# Patient Record
Sex: Female | Born: 1960 | Race: Black or African American | Hispanic: No | Marital: Single | Smoking: Never smoker
Health system: Southern US, Community
[De-identification: ages and names within clinical notes are randomized; demographics above are authoritative.]

## PROBLEM LIST (undated history)

## (undated) DIAGNOSIS — E119 Type 2 diabetes mellitus without complications: Secondary | ICD-10-CM

## (undated) DIAGNOSIS — D649 Anemia, unspecified: Secondary | ICD-10-CM

## (undated) DIAGNOSIS — K219 Gastro-esophageal reflux disease without esophagitis: Secondary | ICD-10-CM

## (undated) DIAGNOSIS — M549 Dorsalgia, unspecified: Secondary | ICD-10-CM

## (undated) DIAGNOSIS — H269 Unspecified cataract: Secondary | ICD-10-CM

## (undated) DIAGNOSIS — J45909 Unspecified asthma, uncomplicated: Secondary | ICD-10-CM

## (undated) DIAGNOSIS — Z9289 Personal history of other medical treatment: Secondary | ICD-10-CM

## (undated) DIAGNOSIS — I10 Essential (primary) hypertension: Secondary | ICD-10-CM

## (undated) DIAGNOSIS — Z6841 Body Mass Index (BMI) 40.0 and over, adult: Secondary | ICD-10-CM

## (undated) DIAGNOSIS — G2581 Restless legs syndrome: Secondary | ICD-10-CM

## (undated) HISTORY — PX: ABDOMINAL HYSTERECTOMY: SHX81

## (undated) HISTORY — DX: Anemia, unspecified: D64.9

---

## 2005-12-08 ENCOUNTER — Emergency Department: Payer: Self-pay | Admitting: Emergency Medicine

## 2006-05-27 ENCOUNTER — Encounter: Payer: Self-pay | Admitting: Anesthesiology

## 2006-06-12 ENCOUNTER — Emergency Department: Payer: Self-pay | Admitting: Emergency Medicine

## 2006-06-18 ENCOUNTER — Encounter: Payer: Self-pay | Admitting: Anesthesiology

## 2006-07-18 ENCOUNTER — Encounter: Payer: Self-pay | Admitting: Anesthesiology

## 2006-11-27 ENCOUNTER — Ambulatory Visit: Payer: Self-pay | Admitting: Anesthesiology

## 2006-12-14 ENCOUNTER — Encounter: Payer: Self-pay | Admitting: Anesthesiology

## 2009-10-08 ENCOUNTER — Emergency Department: Payer: Self-pay | Admitting: Emergency Medicine

## 2010-05-12 ENCOUNTER — Emergency Department: Payer: Self-pay | Admitting: Emergency Medicine

## 2011-07-08 ENCOUNTER — Emergency Department: Payer: Self-pay | Admitting: Unknown Physician Specialty

## 2012-02-08 ENCOUNTER — Emergency Department: Payer: Self-pay | Admitting: Emergency Medicine

## 2012-02-08 LAB — COMPREHENSIVE METABOLIC PANEL
Albumin: 3.8 g/dL (ref 3.4–5.0)
Anion Gap: 3 — ABNORMAL LOW (ref 7–16)
BUN: 15 mg/dL (ref 7–18)
Calcium, Total: 8.7 mg/dL (ref 8.5–10.1)
Chloride: 106 mmol/L (ref 98–107)
Creatinine: 0.98 mg/dL (ref 0.60–1.30)
EGFR (African American): >60
EGFR (Non-African Amer.): >60
Glucose: 129 mg/dL — ABNORMAL HIGH (ref 65–99)
Osmolality: 278 (ref 275–301)
Total Protein: 8.4 g/dL — ABNORMAL HIGH (ref 6.4–8.2)

## 2012-02-08 LAB — CBC
MCV: 86 fL (ref 80–100)
RBC: 4.76 10*6/uL (ref 3.80–5.20)
RDW: 15 % — ABNORMAL HIGH (ref 11.5–14.5)

## 2012-02-08 LAB — LIPASE, BLOOD: Lipase: 79 U/L (ref 73–393)

## 2012-02-08 LAB — TROPONIN I: Troponin-I: <0.02 ng/mL

## 2012-02-09 LAB — URINALYSIS, COMPLETE
Bilirubin,UR: NEGATIVE
Glucose,UR: NEGATIVE mg/dL (ref 0–75)
Ketone: NEGATIVE
Leukocyte Esterase: NEGATIVE
Nitrite: NEGATIVE
Protein: 30
Specific Gravity: 1.023 (ref 1.003–1.030)
WBC UR: 3 /HPF (ref 0–5)

## 2012-03-01 ENCOUNTER — Ambulatory Visit: Payer: Self-pay | Admitting: Adult Health

## 2013-04-20 ENCOUNTER — Ambulatory Visit: Payer: Self-pay | Admitting: Internal Medicine

## 2013-08-11 ENCOUNTER — Emergency Department: Payer: Self-pay | Admitting: Emergency Medicine

## 2013-08-11 LAB — URINALYSIS, COMPLETE
Bacteria: NONE SEEN
Bilirubin,UR: NEGATIVE
Glucose,UR: NEGATIVE mg/dL (ref 0–75)
Ketone: NEGATIVE
Leukocyte Esterase: NEGATIVE
Nitrite: NEGATIVE
Protein: 100
RBC,UR: 4454 /HPF (ref 0–5)
Specific Gravity: 1.021 (ref 1.003–1.030)
Squamous Epithelial: NONE SEEN
WBC UR: 13 /HPF (ref 0–5)

## 2013-08-11 LAB — CBC WITH DIFFERENTIAL/PLATELET
Basophil #: 0.1 10*3/uL (ref 0.0–0.1)
Eosinophil %: 3.1 %
HGB: 13.4 g/dL (ref 12.0–16.0)
Lymphocyte #: 1.2 10*3/uL (ref 1.0–3.6)
MCH: 28.1 pg (ref 26.0–34.0)
Monocyte #: 0.5 x10 3/mm (ref 0.2–0.9)
Monocyte %: 6.7 %
Platelet: 361 10*3/uL (ref 150–440)
RDW: 15.2 % — ABNORMAL HIGH (ref 11.5–14.5)
WBC: 7.7 10*3/uL (ref 3.6–11.0)

## 2013-08-11 LAB — COMPREHENSIVE METABOLIC PANEL
Alkaline Phosphatase: 89 U/L
BUN: 13 mg/dL (ref 7–18)
Calcium, Total: 9.1 mg/dL (ref 8.5–10.1)
Co2: 33 mmol/L — ABNORMAL HIGH (ref 21–32)
EGFR (African American): >60
Glucose: 144 mg/dL — ABNORMAL HIGH (ref 65–99)
Osmolality: 278 (ref 275–301)
SGOT(AST): 17 U/L (ref 15–37)
Total Protein: 8.1 g/dL (ref 6.4–8.2)

## 2013-08-11 LAB — LIPASE, BLOOD: Lipase: 51 U/L — ABNORMAL LOW (ref 73–393)

## 2013-08-18 HISTORY — PX: POLYPECTOMY: SHX149

## 2014-02-27 ENCOUNTER — Ambulatory Visit: Payer: Self-pay | Admitting: Family Medicine

## 2014-03-20 ENCOUNTER — Emergency Department: Payer: Self-pay | Admitting: Emergency Medicine

## 2014-03-20 LAB — CBC WITH DIFFERENTIAL/PLATELET
BASOS ABS: 0 10*3/uL (ref 0.0–0.1)
Basophil %: 0.4 %
EOS ABS: 0.1 10*3/uL (ref 0.0–0.7)
EOS PCT: 1.7 %
HCT: 42.7 % (ref 35.0–47.0)
HGB: 13.7 g/dL (ref 12.0–16.0)
Lymphocyte #: 0.6 10*3/uL — ABNORMAL LOW (ref 1.0–3.6)
Lymphocyte %: 8.1 %
MCH: 26.9 pg (ref 26.0–34.0)
MCHC: 32.2 g/dL (ref 32.0–36.0)
MCV: 84 fL (ref 80–100)
MONOS PCT: 3 %
Monocyte #: 0.2 x10 3/mm (ref 0.2–0.9)
NEUTROS ABS: 6.3 10*3/uL (ref 1.4–6.5)
NEUTROS PCT: 86.8 %
Platelet: 420 10*3/uL (ref 150–440)
RBC: 5.1 10*6/uL (ref 3.80–5.20)
RDW: 15.6 % — ABNORMAL HIGH (ref 11.5–14.5)
WBC: 7.3 10*3/uL (ref 3.6–11.0)

## 2014-03-20 LAB — COMPREHENSIVE METABOLIC PANEL
ALBUMIN: 3.5 g/dL (ref 3.4–5.0)
AST: 13 U/L — AB (ref 15–37)
Alkaline Phosphatase: 89 U/L
Anion Gap: 6 — ABNORMAL LOW (ref 7–16)
BUN: 14 mg/dL (ref 7–18)
Bilirubin,Total: 0.4 mg/dL (ref 0.2–1.0)
CHLORIDE: 103 mmol/L (ref 98–107)
CO2: 31 mmol/L (ref 21–32)
CREATININE: 1.15 mg/dL (ref 0.60–1.30)
Calcium, Total: 8.8 mg/dL (ref 8.5–10.1)
EGFR (African American): >60
EGFR (Non-African Amer.): 54 — ABNORMAL LOW
GLUCOSE: 125 mg/dL — AB (ref 65–99)
Osmolality: 281 (ref 275–301)
Potassium: 3.7 mmol/L (ref 3.5–5.1)
SGPT (ALT): 23 U/L
Sodium: 140 mmol/L (ref 136–145)
Total Protein: 8.6 g/dL — ABNORMAL HIGH (ref 6.4–8.2)

## 2014-03-20 LAB — TROPONIN I: Troponin-I: <0.02 ng/mL

## 2014-03-20 LAB — LIPASE, BLOOD: Lipase: 80 U/L (ref 73–393)

## 2014-04-10 ENCOUNTER — Ambulatory Visit: Payer: Self-pay | Admitting: Obstetrics and Gynecology

## 2014-04-10 LAB — CBC
HCT: 38.2 % (ref 35.0–47.0)
HGB: 12 g/dL (ref 12.0–16.0)
MCH: 26.3 pg (ref 26.0–34.0)
MCHC: 31.5 g/dL — ABNORMAL LOW (ref 32.0–36.0)
MCV: 83 fL (ref 80–100)
Platelet: 421 10*3/uL (ref 150–440)
RBC: 4.58 10*6/uL (ref 3.80–5.20)
RDW: 16.3 % — ABNORMAL HIGH (ref 11.5–14.5)
WBC: 6.5 10*3/uL (ref 3.6–11.0)

## 2014-04-10 LAB — COMPREHENSIVE METABOLIC PANEL
Albumin: 3.1 g/dL — ABNORMAL LOW (ref 3.4–5.0)
Alkaline Phosphatase: 82 U/L
Anion Gap: 4 — ABNORMAL LOW (ref 7–16)
BILIRUBIN TOTAL: 0.3 mg/dL (ref 0.2–1.0)
BUN: 14 mg/dL (ref 7–18)
Calcium, Total: 8.8 mg/dL (ref 8.5–10.1)
Chloride: 108 mmol/L — ABNORMAL HIGH (ref 98–107)
Co2: 31 mmol/L (ref 21–32)
Creatinine: 1.14 mg/dL (ref 0.60–1.30)
EGFR (Non-African Amer.): 54 — ABNORMAL LOW
Glucose: 107 mg/dL — ABNORMAL HIGH (ref 65–99)
Osmolality: 286 (ref 275–301)
Potassium: 3.4 mmol/L — ABNORMAL LOW (ref 3.5–5.1)
SGOT(AST): 18 U/L (ref 15–37)
SGPT (ALT): 14 U/L
Sodium: 143 mmol/L (ref 136–145)
TOTAL PROTEIN: 7.5 g/dL (ref 6.4–8.2)

## 2014-04-20 ENCOUNTER — Ambulatory Visit: Payer: Self-pay | Admitting: Obstetrics and Gynecology

## 2014-04-20 LAB — HCG, QUANTITATIVE, PREGNANCY

## 2014-04-28 LAB — PATHOLOGY REPORT

## 2014-08-04 ENCOUNTER — Emergency Department: Payer: Self-pay | Admitting: Emergency Medicine

## 2014-09-18 ENCOUNTER — Emergency Department: Payer: Self-pay | Admitting: Emergency Medicine

## 2014-09-29 ENCOUNTER — Emergency Department: Payer: Self-pay | Admitting: Emergency Medicine

## 2014-12-09 NOTE — Op Note (Signed)
PATIENT NAME:  Kristina Cruz, Kristina Cruz MR#:  [id number] DATE OF BIRTH:  Dec 30, 1960  DATE OF PROCEDURE:  04/20/2014  PREOPERATIVE DIAGNOSIS: Postmenopausal bleeding.   POSTOPERATIVE DIAGNOSES: 1.  Postmenopausal bleeding.  2.  Likely polyps.  PROCEDURES: 1.  Dilation and curettage.  2.  Hysteroscopy.  3.  Polypectomy.   ANESTHESIA: General.   SURGEON: Thomasene MohairStephen Monterio Bob, M.D.   ESTIMATED BLOOD LOSS: 25 mL.  OPERATIVE FLUIDS: 600 mL crystalloid.   FLUID DEFICIT: 80 mL.  COMPLICATIONS: None.   FINDINGS: Polypoid lesions on the anterior wall of the uterus.   SPECIMENS:  1.  Endometrial curettings.  2.  Polyps.   CONDITION AT END OF PROCEDURE: Stable.   PROCEDURE IN DETAIL: The patient was taken to the operating room where general anesthesia was administered and found to be adequate. The patient was placed in the dorsal supine high lithotomy position in candy-cane stirrups with care to minimize risk of damage to nerves or blood vessels. She was prepped and draped in the usual sterile fashion. A timeout was called and her bladder was drained using in and out catheterization. A sterile speculum was placed in the vagina and a single-tooth tenaculum was affixed to the anterior lip of the cervix. The uterus was dilated gently in a serial fashion using Hegar dilators to 6 mm. The MyoSure hysteroscope was gently advanced through the cervix into the uterine cavity with the above-noted findings. The MyoSure light device was then used to remove the polypoid lesions. The scope was removed and gentle curettage was performed for global sample of the uterus. The scope was reintroduced to verify that hemostasis was present, which was verified. At this point, the procedure was terminated. The hysteroscope was removed, as well as the single-tooth tenaculum, and hemostasis verified. All instrumentation was verified to be out of the vagina.   The patient tolerated the procedure well. Sponge, lap, and needle  counts were correct x2. For VTE prophylaxis, the patient was wearing pneumatic compression stockings throughout the entire procedure. She was awakened in the operating room taken to the recovery area in stable condition.   ____________________________ Conard NovakStephen D. Sanyla Summey, MD sdj:sb D: 04/20/2014 10:26:04 ET T: 04/20/2014 10:35:16 ET JOB#: [id number]  cc: Conard NovakStephen D. Charissa Knowles, MD, <Dictator> Conard NovakSTEPHEN D Korena Nass MD ELECTRONICALLY SIGNED 05/05/2014 10:54

## 2014-12-21 ENCOUNTER — Emergency Department: Payer: Medicaid Other

## 2014-12-21 ENCOUNTER — Other Ambulatory Visit: Payer: Self-pay

## 2014-12-21 ENCOUNTER — Emergency Department
Admission: EM | Admit: 2014-12-21 | Discharge: 2014-12-21 | Disposition: A | Discharge status: Home / Self Care | Payer: Medicaid Other | Attending: Internal Medicine | Admitting: Internal Medicine

## 2014-12-21 ENCOUNTER — Encounter: Payer: Self-pay | Admitting: Emergency Medicine

## 2014-12-21 DIAGNOSIS — I10 Essential (primary) hypertension: Secondary | ICD-10-CM | POA: Diagnosis not present

## 2014-12-21 DIAGNOSIS — R1013 Epigastric pain: Secondary | ICD-10-CM | POA: Insufficient documentation

## 2014-12-21 DIAGNOSIS — M25552 Pain in left hip: Secondary | ICD-10-CM | POA: Diagnosis not present

## 2014-12-21 DIAGNOSIS — Z79899 Other long term (current) drug therapy: Secondary | ICD-10-CM | POA: Insufficient documentation

## 2014-12-21 DIAGNOSIS — E876 Hypokalemia: Secondary | ICD-10-CM | POA: Diagnosis not present

## 2014-12-21 DIAGNOSIS — G8929 Other chronic pain: Secondary | ICD-10-CM | POA: Diagnosis not present

## 2014-12-21 DIAGNOSIS — R42 Dizziness and giddiness: Secondary | ICD-10-CM | POA: Diagnosis present

## 2014-12-21 HISTORY — DX: Essential (primary) hypertension: I10

## 2014-12-21 LAB — COMPREHENSIVE METABOLIC PANEL WITH GFR
ALT: 13 U/L — ABNORMAL LOW (ref 14–54)
AST: 16 U/L (ref 15–41)
Albumin: 3.5 g/dL (ref 3.5–5.0)
Alkaline Phosphatase: 67 U/L (ref 38–126)
Anion gap: 7 (ref 5–15)
BUN: 13 mg/dL (ref 6–20)
CO2: 32 mmol/L (ref 22–32)
Calcium: 8.8 mg/dL — ABNORMAL LOW (ref 8.9–10.3)
Chloride: 103 mmol/L (ref 101–111)
Creatinine, Ser: 1.08 mg/dL — ABNORMAL HIGH (ref 0.44–1.00)
GFR calc Af Amer: >60 mL/min (ref >60)
GFR calc non Af Amer: 56 mL/min — ABNORMAL LOW (ref >60)
Glucose, Bld: 106 mg/dL — ABNORMAL HIGH (ref 65–99)
Potassium: 3.4 mmol/L — ABNORMAL LOW (ref 3.5–5.1)
Sodium: 142 mmol/L (ref 135–145)
Total Bilirubin: 0.7 mg/dL (ref 0.3–1.2)
Total Protein: 7.8 g/dL (ref 6.5–8.1)

## 2014-12-21 LAB — CBC WITH DIFFERENTIAL/PLATELET
Basophils Absolute: 0.1 K/uL (ref 0–0.1)
Basophils Relative: 1 %
Eosinophils Absolute: 0.3 K/uL (ref 0–0.7)
Eosinophils Relative: 4 %
HCT: 42.5 % (ref 35.0–47.0)
Hemoglobin: 13.7 g/dL (ref 12.0–16.0)
Lymphocytes Relative: 32 %
Lymphs Abs: 2.2 K/uL (ref 1.0–3.6)
MCH: 27.6 pg (ref 26.0–34.0)
MCHC: 32.2 g/dL (ref 32.0–36.0)
MCV: 85.7 fL (ref 80.0–100.0)
Monocytes Absolute: 0.6 K/uL (ref 0.2–0.9)
Monocytes Relative: 9 %
Neutro Abs: 3.6 K/uL (ref 1.4–6.5)
Neutrophils Relative %: 54 %
Platelets: 374 K/uL (ref 150–440)
RBC: 4.96 MIL/uL (ref 3.80–5.20)
RDW: 15 % — ABNORMAL HIGH (ref 11.5–14.5)
WBC: 6.7 K/uL (ref 3.6–11.0)

## 2014-12-21 LAB — LIPASE, BLOOD: LIPASE: 27 U/L (ref 22–51)

## 2014-12-21 LAB — TROPONIN I: Troponin I: <0.03 ng/mL (ref <0.031)

## 2014-12-21 MED ORDER — SODIUM CHLORIDE 0.9 % IV SOLN
INTRAVENOUS | Status: DC
  Administered 2014-12-21: 09:00:00 via INTRAVENOUS

## 2014-12-21 MED ORDER — ONDANSETRON HCL 4 MG/2ML IJ SOLN
4.0000 mg | Freq: Once | INTRAMUSCULAR | Status: AC
  Administered 2014-12-21: 4 mg via INTRAVENOUS

## 2014-12-21 MED ORDER — PANTOPRAZOLE SODIUM 40 MG IV SOLR
40.0000 mg | Freq: Once | INTRAVENOUS | Status: AC
  Administered 2014-12-21: 40 mg via INTRAVENOUS

## 2014-12-21 MED ORDER — PANTOPRAZOLE SODIUM 40 MG IV SOLR
INTRAVENOUS | Status: AC
  Administered 2014-12-21: 40 mg via INTRAVENOUS
  Filled 2014-12-21: qty 40

## 2014-12-21 MED ORDER — PANTOPRAZOLE SODIUM 40 MG PO TBEC: 40.0000 mg | DELAYED_RELEASE_TABLET | Freq: Every day | ORAL | Status: DC

## 2014-12-21 MED ORDER — ONDANSETRON HCL 4 MG/2ML IJ SOLN
INTRAMUSCULAR | Status: AC
  Administered 2014-12-21: 4 mg via INTRAVENOUS
  Filled 2014-12-21: qty 2

## 2014-12-21 MED ORDER — POTASSIUM CHLORIDE ER 10 MEQ PO TBCR: 10.0000 meq | EXTENDED_RELEASE_TABLET | Freq: Every day | ORAL | Status: DC

## 2014-12-21 NOTE — ED Notes (Addendum)
Patient assisted into stretcher. Cardiac monitor placed on patient. No obvious distress at this time. Call bell within reach. Patient instructed to call with needs/concerns. Notified MD of patient's arrival to room.

## 2014-12-21 NOTE — ED Notes (Signed)
Patient states that she attempted to void but was unable.

## 2014-12-21 NOTE — ED Provider Notes (Signed)
[facility] Emergency Department Provider Note    ____________________________________________  Time seen: 8:02 AM I have reviewed the triage vital signs and the nursing notes.   HISTORY  Chief Complaint Abdominal Pain and Dizziness        HPI Kristina Cruz is a 54 y.o. female presents to the emergency department with a chief: Complaint of abdominal pain.  She's had the abdominal pain for one week she describes it as epigastric in location of the duration has been one week it's intermittent in nature and seems to be worse when she is trying to eat or eating after eating. The severity she describes as 5-6/10 moderate in severity or quality is aching and soreness. Modifying factors or food seems to make it worse. Associated symptoms are nausea. She has had no episodes of vomiting. She has had no diarrhea.  Yesterday only ate "a few bites" because the pain was making her belly feel worse. Her daughter did force her to eat a hot dog this morning with some water. She did spit up a little bit after drinking the water but doesn't think she was vomiting.  She is also complaining of feeling lightheaded and weak and dizzy. This is been for the past few days. She has not been eating or drinking as much as she normally does over the past few days.  She has not taken her medication for high blood pressure today which is hydrochlorothiazide. She has been taking a lot of BC powders for her hip pain. She does have an appointment to see an orthopedic doctor in the next several weeks to determine whether she needs surgery or further evaluation of her chronic left hip pain. She has a prescription for tramadol for this pain.     Past Medical History  Diagnosis Date  . Hypertension   Restless leg syndrome Left hip problems Peptic ulcer disease    There are no active problems to display for this patient.   History reviewed. No pertinent past surgical  history.  Current Outpatient Rx  Name  Route  Sig  Dispense  Refill  . Aspirin-Salicylamide-Caffeine (BC HEADACHE POWDER PO)   Oral   Take 1 packet by mouth daily as needed.         Marland Kitchen PRESCRIPTION MEDICATION   Oral   Take 1 tablet by mouth daily.         Marland Kitchen rOPINIRole (REQUIP) 0.25 MG tablet   Oral   Take 0.25 mg by mouth 3 (three) times daily.          tramadol 50 mg when necessary HCTZ 25 mg daily  BC powder for her hip pain   Allergies   Other  No family history on file.  Social History History  Substance Use Topics  . Smoking status: Never Smoker   . Smokeless tobacco: Not on file  . Alcohol Use: No    Review of Systems  Constitutional: Negative for fever. Eyes: Negative for visual changes. ENT: Negative for sore throat. Cardiovascular: Negative for chest pain. Respiratory: Negative for shortness of breath. Gastrointestinal: Positive for abdominal pain, no vomiting and diarrhea. Genitourinary: Negative for dysuria. Musculoskeletal: Negative for back pain. Skin: Negative for rash. Neurological: Negative for headaches, focal weakness or numbness. Positive for dizziness 10-point ROS otherwise negative.  ____________________________________________   PHYSICAL EXAM:  VITAL SIGNS: ED Triage Vitals  Enc Vitals Group     BP --      Pulse --      Resp --  Temp --      Temp src --      SpO2 --      Weight --      Height --      Head Cir --      Peak Flow --      Pain Score 12/21/14 0743 7     Pain Loc --      Pain Edu? --      Excl. in GC? --   Initial vital signs in the emergency department shows a blood pressure 138/104 which is elevated. Heart rate of 80 and a respiratory rate of 18 which are normal a sat of 100% on room air which is normal and the patient is afebrile.   Constitutional: Alert and oriented. Well appearing and in no distress. Eyes: Conjunctivae are normal. PERRL. Normal extraocular movements. ENT   Head:  Normocephalic and atraumatic.   Nose: No congestion/rhinnorhea.   Mouth/Throat: Mucous membranes are moist.   Neck: No stridor. Hematological/Lymphatic/Immunilogical: No cervical lymphadenopathy. Cardiovascular: Normal rate, regular rhythm. Normal and symmetric distal pulses are present in all extremities. No murmurs, rubs, or gallops. Respiratory: Normal respiratory effort without tachypnea nor retractions. Breath sounds are clear and equal bilaterally. No wheezes/rales/rhonchi. Gastrointestinal: Soft and nontender. No distention. No abdominal bruits. There is no CVA tenderness. Genitourinary: Deferred Musculoskeletal: Nontender with normal range of motion in all extremities. No joint effusions.  No lower extremity tenderness nor edema. Neurologic:  Normal speech and language. No gross focal neurologic deficits are appreciated. Speech is normal. No gait instability. Skin:  Skin is warm, dry and intact. No rash noted. Psychiatric: Mood and affect are normal. Speech and behavior are normal. Patient exhibits appropriate insight and judgment.  ____________________________________________    LABS (pertinent positives/negatives)  Labs Reviewed  CBC WITH DIFFERENTIAL/PLATELET - Abnormal; Notable for the following:    RDW 15.0 (*)    All other components within normal limits  COMPREHENSIVE METABOLIC PANEL - Abnormal; Notable for the following:    Potassium 3.4 (*)    Glucose, Bld 106 (*)    Creatinine, Ser 1.08 (*)    Calcium 8.8 (*)    ALT 13 (*)    GFR calc non Af Amer 56 (*)    All other components within normal limits  TROPONIN I  LIPASE, BLOOD  URINALYSIS COMPLETEWITH MICROSCOPIC [facility])    labs are significant for slightly low potassium at 3.4. And a Slightly elevated creatinine 1.08.   Significant normal labs included troponin which is normal at less than 0.03, and a lipase which is normal at 27____________________________________   EKG  ED ECG REPORT   Date:  12/21/2014  EKG Time: 8:37 AM  Rate: 67  Rhythm: normal EKG, normal sinus rhythm, unchanged from previous tracings, normal sinus rhythm  Axis: Normal  Intervals:left anterior fascicular block  ST&T Change: T wave abnormality consider anterolateral ischemia. Minimal voltage for LVH. Nonspecific ST-T wave changes in the anterior septal and lateral leads.   ____________________________________________    RADIOLOGY   x-ray three-way abdomen was done in the emergency room and shows normal bowel gas pattern no free air noted acute cardiopulmonary abnormalities.  ____________________________________________   PROCEDURES  Procedure(s) performed: None  Critical Care performed: No  ____________________________________________   INITIAL IMPRESSION / ASSESSMENT AND PLAN  /D COURSE  Pertinent labs & imaging results that were available during my care of the patient were reviewed by me and considered in my medical decision making (see chart for details).   Impression  54 year old female presents to the ED with one-week history of abdominal pain, nausea, and decreased appetite. In the emergency department she received IV Zofran and IV Protonix.  She had no nausea or vomiting in the emergency department and was able to tolerate fluids. Her laboratory evaluation and radiologic studies and EKG do not indicate an indication for hospitalization. However she will need to be evaluated further by her primary care doctor with a possible consultation to gastroenterology for upper endoscopy  . I suspect the diagnosis of recurrence of peptic ulcer disease or gastritis. I will start her on oral Protonix. She will be given prescription for Zofran by mouth for her nausea be encouraged to eat a lot lights and non-spicy diet with no alcohol.  For the hypokalemia she is given one week of by mouth KCl.        FINAL CLINICAL IMPRESSION(S) / ED DIAGNOSES  Final diagnoses:  None   #1 abdominal pain #2  nausea #3 hypokalemia #4 hypertension    Sherlyn HaySheryl L Valina Maes, DO 12/21/14 1128

## 2014-12-21 NOTE — Discharge Instructions (Signed)
Take your bp medication as directed. Take the potassium medication as directed. Take Protonix as directed. Eat a low fat nonfried

## 2014-12-21 NOTE — ED Notes (Addendum)
Discussed need for urine sample with patient. Patient denies need to void at this time. Encouraged patient to notify nurse when able to provide sample. Patient verbalized understanding.   

## 2014-12-21 NOTE — ED Notes (Signed)
Report form Glenford PeersJean J, RN

## 2014-12-21 NOTE — ED Notes (Signed)
Epigastric abd pain x1 week, worsening , with lightheaded and dizziness, ambulatory with a cane

## 2015-02-16 ENCOUNTER — Emergency Department
Admission: EM | Admit: 2015-02-16 | Discharge: 2015-02-16 | Disposition: A | Discharge status: Home / Self Care | Payer: Medicaid Other | Attending: Emergency Medicine | Admitting: Emergency Medicine

## 2015-02-16 ENCOUNTER — Emergency Department: Payer: Medicaid Other

## 2015-02-16 ENCOUNTER — Other Ambulatory Visit: Payer: Self-pay

## 2015-02-16 DIAGNOSIS — R079 Chest pain, unspecified: Secondary | ICD-10-CM | POA: Insufficient documentation

## 2015-02-16 DIAGNOSIS — J40 Bronchitis, not specified as acute or chronic: Secondary | ICD-10-CM

## 2015-02-16 DIAGNOSIS — I10 Essential (primary) hypertension: Secondary | ICD-10-CM | POA: Diagnosis not present

## 2015-02-16 DIAGNOSIS — Z79899 Other long term (current) drug therapy: Secondary | ICD-10-CM | POA: Diagnosis not present

## 2015-02-16 DIAGNOSIS — R0602 Shortness of breath: Secondary | ICD-10-CM | POA: Diagnosis present

## 2015-02-16 LAB — URINALYSIS COMPLETE WITH MICROSCOPIC (ARMC ONLY)
Bacteria, UA: NONE SEEN
Bilirubin Urine: NEGATIVE
Glucose, UA: NEGATIVE mg/dL
Ketones, ur: NEGATIVE mg/dL
LEUKOCYTES UA: NEGATIVE
Nitrite: NEGATIVE
Protein, ur: NEGATIVE mg/dL
Specific Gravity, Urine: 1.016 (ref 1.005–1.030)
pH: 5 (ref 5.0–8.0)

## 2015-02-16 LAB — COMPREHENSIVE METABOLIC PANEL
ALBUMIN: 3.6 g/dL (ref 3.5–5.0)
ALK PHOS: 87 U/L (ref 38–126)
ALT: 12 U/L — AB (ref 14–54)
ANION GAP: 9 (ref 5–15)
AST: 13 U/L — ABNORMAL LOW (ref 15–41)
BILIRUBIN TOTAL: 0.3 mg/dL (ref 0.3–1.2)
BUN: 12 mg/dL (ref 6–20)
CHLORIDE: 105 mmol/L (ref 101–111)
CO2: 26 mmol/L (ref 22–32)
Calcium: 8.7 mg/dL — ABNORMAL LOW (ref 8.9–10.3)
Creatinine, Ser: 1.04 mg/dL — ABNORMAL HIGH (ref 0.44–1.00)
GFR calc Af Amer: >60 mL/min (ref >60)
GFR calc non Af Amer: 59 mL/min — ABNORMAL LOW (ref >60)
GLUCOSE: 105 mg/dL — AB (ref 65–99)
Potassium: 3.5 mmol/L (ref 3.5–5.1)
SODIUM: 140 mmol/L (ref 135–145)
TOTAL PROTEIN: 7.6 g/dL (ref 6.5–8.1)

## 2015-02-16 LAB — CBC
HCT: 38.6 % (ref 35.0–47.0)
HEMOGLOBIN: 12.7 g/dL (ref 12.0–16.0)
MCH: 28 pg (ref 26.0–34.0)
MCHC: 32.9 g/dL (ref 32.0–36.0)
MCV: 85.1 fL (ref 80.0–100.0)
Platelets: 359 10*3/uL (ref 150–440)
RBC: 4.54 MIL/uL (ref 3.80–5.20)
RDW: 15.5 % — ABNORMAL HIGH (ref 11.5–14.5)
WBC: 7.6 10*3/uL (ref 3.6–11.0)

## 2015-02-16 LAB — TROPONIN I

## 2015-02-16 MED ORDER — MORPHINE SULFATE 2 MG/ML IJ SOLN
2.0000 mg | Freq: Once | INTRAMUSCULAR | Status: AC
  Administered 2015-02-16: 2 mg via INTRAVENOUS

## 2015-02-16 MED ORDER — IPRATROPIUM-ALBUTEROL 0.5-2.5 (3) MG/3ML IN SOLN
3.0000 mL | Freq: Once | RESPIRATORY_TRACT | Status: AC
  Administered 2015-02-16: 3 mL via RESPIRATORY_TRACT

## 2015-02-16 MED ORDER — IPRATROPIUM-ALBUTEROL 0.5-2.5 (3) MG/3ML IN SOLN
RESPIRATORY_TRACT | Status: AC
  Administered 2015-02-16: 3 mL via RESPIRATORY_TRACT
  Filled 2015-02-16: qty 3

## 2015-02-16 MED ORDER — DIPHENHYDRAMINE HCL 50 MG/ML IJ SOLN
INTRAMUSCULAR | Status: AC
  Administered 2015-02-16: 50 mg via INTRAVENOUS
  Filled 2015-02-16: qty 1

## 2015-02-16 MED ORDER — MORPHINE SULFATE 2 MG/ML IJ SOLN
INTRAMUSCULAR | Status: AC
  Administered 2015-02-16: 2 mg via INTRAVENOUS
  Filled 2015-02-16: qty 1

## 2015-02-16 MED ORDER — IOHEXOL 350 MG/ML SOLN
100.0000 mL | Freq: Once | INTRAVENOUS | Status: AC | PRN
  Administered 2015-02-16: 100 mL via INTRAVENOUS

## 2015-02-16 MED ORDER — ONDANSETRON HCL 4 MG/2ML IJ SOLN
INTRAMUSCULAR | Status: AC
  Administered 2015-02-16: 4 mg via INTRAVENOUS
  Filled 2015-02-16: qty 2

## 2015-02-16 MED ORDER — PREDNISONE 20 MG PO TABS: 60.0000 mg | ORAL_TABLET | Freq: Every day | ORAL | Status: DC

## 2015-02-16 MED ORDER — ONDANSETRON HCL 4 MG/2ML IJ SOLN
4.0000 mg | Freq: Once | INTRAMUSCULAR | Status: AC
  Administered 2015-02-16: 4 mg via INTRAVENOUS

## 2015-02-16 MED ORDER — METHYLPREDNISOLONE SODIUM SUCC 125 MG IJ SOLR
INTRAMUSCULAR | Status: AC
  Administered 2015-02-16: 125 mg via INTRAVENOUS
  Filled 2015-02-16: qty 2

## 2015-02-16 MED ORDER — ASPIRIN 81 MG PO CHEW
CHEWABLE_TABLET | ORAL | Status: AC
  Administered 2015-02-16: 324 mg via ORAL
  Filled 2015-02-16: qty 4

## 2015-02-16 MED ORDER — ASPIRIN EC 325 MG PO TBEC: 325.0000 mg | DELAYED_RELEASE_TABLET | Freq: Once | ORAL | Status: DC

## 2015-02-16 MED ORDER — ASPIRIN 81 MG PO CHEW
324.0000 mg | CHEWABLE_TABLET | Freq: Once | ORAL | Status: AC
  Administered 2015-02-16: 324 mg via ORAL

## 2015-02-16 MED ORDER — METHYLPREDNISOLONE SODIUM SUCC 125 MG IJ SOLR
125.0000 mg | Freq: Once | INTRAMUSCULAR | Status: AC
  Administered 2015-02-16: 125 mg via INTRAVENOUS

## 2015-02-16 MED ORDER — DIPHENHYDRAMINE HCL 50 MG/ML IJ SOLN
50.0000 mg | Freq: Once | INTRAMUSCULAR | Status: AC
  Administered 2015-02-16: 50 mg via INTRAVENOUS

## 2015-02-16 MED ORDER — PREDNISONE 20 MG PO TABS: 60.0000 mg | ORAL_TABLET | Freq: Once | ORAL | Status: DC

## 2015-02-16 NOTE — Discharge Instructions (Signed)

## 2015-02-16 NOTE — ED Provider Notes (Signed)
[facility] Emergency Department Provider Note  ____________________________________________  Time seen: 6:00 AM  I have reviewed the triage vital signs and the nursing notes.   HISTORY  Chief Complaint Shortness of Breath      HPI Kristina Cruz is a 54 y.o. female presents with cough and dyspnea and right-sided chest pain times 2 days.     Past Medical History  Diagnosis Date  . Hypertension     There are no active problems to display for this patient.   No past surgical history on file.  Current Outpatient Rx  Name  Route  Sig  Dispense  Refill  . Aspirin-Salicylamide-Caffeine (BC HEADACHE POWDER PO)   Oral   Take 1 packet by mouth daily as needed.         . pantoprazole (PROTONIX) 40 MG tablet   Oral   Take 1 tablet (40 mg total) by mouth daily.   30 tablet   1   . potassium chloride (K-DUR) 10 MEQ tablet   Oral   Take 1 tablet (10 mEq total) by mouth daily.   30 tablet   0   . PRESCRIPTION MEDICATION   Oral   Take 1 tablet by mouth daily.         Marland Kitchen rOPINIRole (REQUIP) 0.25 MG tablet   Oral   Take 0.25 mg by mouth 3 (three) times daily.           Allergies Other  No family history on file.  Social History History  Substance Use Topics  . Smoking status: Never Smoker   . Smokeless tobacco: Not on file  . Alcohol Use: No    Review of Systems  Constitutional: Negative for fever. Eyes: Negative for visual changes. ENT: Negative for sore throat. Cardiovascular: Positive for chest pain. Respiratory: Positive for shortness of breath. Gastrointestinal: Negative for abdominal pain, vomiting and diarrhea. Genitourinary: Negative for dysuria. Musculoskeletal: Negative for back pain. Skin: Negative for rash. Neurological: Negative for headaches, focal weakness or numbness.   10-point ROS otherwise negative.  ____________________________________________   PHYSICAL EXAM:  VITAL SIGNS: ED Triage  Vitals  Enc Vitals Group     BP 02/16/15 0604 132/58 mmHg     Pulse Rate 02/16/15 0603 73     Resp 02/16/15 0603 18     Temp 02/16/15 0603 98.3 F (36.8 C)     Temp Source 02/16/15 0603 Oral     SpO2 02/16/15 0603 96 %     Weight 02/16/15 0603 333 lb (151.048 kg)     Height 02/16/15 0603  (1.575 m)     Head Cir --      Peak Flow --      Pain Score 02/16/15 0603 6     Pain Loc --      Pain Edu? --      Excl. in GC? --      Constitutional: Alert and oriented. Well appearing and in no distress. Eyes: Conjunctivae are normal. PERRL. Normal extraocular movements. ENT   Head: Normocephalic and atraumatic.   Nose: No congestion/rhinnorhea.   Mouth/Throat: Mucous membranes are moist.   Neck: No stridor. Cardiovascular: Normal rate, regular rhythm. Normal and symmetric distal pulses are present in all extremities. No murmurs, rubs, or gallops. Respiratory: Normal respiratory effort without tachypnea nor retractions. Breath sounds are clear and equal bilaterally. No wheezes/rales/rhonchi. Gastrointestinal: Soft and nontender. No distention. There is no CVA tenderness. Genitourinary: deferred Musculoskeletal: Nontender with normal range of motion in all  extremities. No joint effusions.  No lower extremity tenderness nor edema. Neurologic:  Normal speech and language. No gross focal neurologic deficits are appreciated. Speech is normal.  Skin:  Skin is warm, dry and intact. No rash noted. Psychiatric: Mood and affect are normal. Speech and behavior are normal. Patient exhibits appropriate insight and judgment.  ____________________________________________    LABS (pertinent positives/negatives)    ____________________________________________   EKG Interpreted by me Dr. Bayard Malesandolph Janal Haak   Date: 02/16/2015  Rate: 67  Rhythm: normal sinus rhythm  QRS Axis: normal  Intervals: normal  ST/T Wave abnormalities: normal  Conduction Disutrbances: none  Narrative  Interpretation: unremarkable      ____________________________________________    RADIOLOGY  CT scan of the chest revealed:  IMPRESSION: No demonstrable pulmonary embolus. No edema or consolidation. There is hepatic steatosis.    INITIAL IMPRESSION / ASSESSMENT AND PLAN / ED COURSE  Pertinent labs & imaging results that were available during my care of the patient were reviewed by me and considered in my medical decision making (see chart for details).    ____________________________________________   FINAL CLINICAL IMPRESSION(S) / ED DIAGNOSES  Final diagnoses:  Bronchitis  Chest pain, unspecified chest pain type      Darci Currentandolph N Stephanine Reas, MD 02/20/15 [phone]

## 2015-02-16 NOTE — ED Notes (Signed)
Pt in with co cough and wheezing since yest.

## 2015-02-16 NOTE — ED Notes (Signed)
Pt returned from CT °

## 2015-02-16 NOTE — ED Notes (Signed)
Pt. States wheezing, coughing with sinus drainage for the past 3 days.  Pt. States more difficult this morning.  Pt. States PCP gave her rescue inhaler couple months ago, but it gave her no relief today.  Pt. Also states chest pain that starts midsternum and radiates to the rt. Side of chest.

## 2015-02-16 NOTE — ED Provider Notes (Addendum)
  Physical Exam  BP 145/71 mmHg  Pulse 67  Temp(Src) 98.3 F (36.8 C) (Oral)  Resp 18  Ht 5\' 2"  (1.575 m)  Wt 333 lb (151.048 kg)  BMI 60.89 kg/m2  SpO2 95%  LMP 02/11/2015 ----------------------------------------- 10:21 AM on 02/16/2015 -----------------------------------------   Physical Exam Patient resting comfortably. No dyspnea. Speaks in full sentences. Not tachycardic, hypotensive or hypoxic. No wheezing on exam but does have expiratory cough. Chest pain anteriorly is reproducible. No ankle swelling at this time. Patient says that her left ankle swollen 3 days ago. Does not smoke or have a diagnosis of asthma or COPD. We will give short course of steroids for bronchitis. We will discharge to home. ED Course  Procedures Reviewed old EKG from May and no changes today. MDM See above.      Myrna Blazeravid Matthew Jonaven Hilgers, MD 02/16/15 1022 has inhaler at home.  Myrna Blazeravid Matthew Aizen Duval, MD 02/16/15 1025

## 2015-05-11 ENCOUNTER — Other Ambulatory Visit: Payer: Self-pay | Admitting: Family Medicine

## 2015-05-11 DIAGNOSIS — Z1231 Encounter for screening mammogram for malignant neoplasm of breast: Secondary | ICD-10-CM

## 2015-05-25 ENCOUNTER — Ambulatory Visit: Payer: Medicaid Other

## 2015-05-29 ENCOUNTER — Ambulatory Visit: Payer: Medicaid Other

## 2015-05-30 ENCOUNTER — Ambulatory Visit
Admission: RE | Admit: 2015-05-30 | Discharge: 2015-05-30 | Disposition: A | Discharge status: Home / Self Care | Payer: Medicaid Other | Source: Ambulatory Visit | Attending: Family Medicine | Admitting: Family Medicine

## 2015-05-30 DIAGNOSIS — Z1231 Encounter for screening mammogram for malignant neoplasm of breast: Secondary | ICD-10-CM | POA: Diagnosis present

## 2015-08-18 ENCOUNTER — Emergency Department
Admission: EM | Admit: 2015-08-18 | Discharge: 2015-08-19 | Disposition: A | Discharge status: Home / Self Care | Payer: Medicaid Other | Attending: Emergency Medicine | Admitting: Emergency Medicine

## 2015-08-18 ENCOUNTER — Emergency Department: Payer: Medicaid Other

## 2015-08-18 ENCOUNTER — Encounter: Payer: Self-pay | Admitting: Emergency Medicine

## 2015-08-18 DIAGNOSIS — I10 Essential (primary) hypertension: Secondary | ICD-10-CM | POA: Insufficient documentation

## 2015-08-18 DIAGNOSIS — M4726 Other spondylosis with radiculopathy, lumbar region: Secondary | ICD-10-CM | POA: Diagnosis not present

## 2015-08-18 DIAGNOSIS — Z79899 Other long term (current) drug therapy: Secondary | ICD-10-CM | POA: Insufficient documentation

## 2015-08-18 DIAGNOSIS — R109 Unspecified abdominal pain: Secondary | ICD-10-CM

## 2015-08-18 DIAGNOSIS — Z7952 Long term (current) use of systemic steroids: Secondary | ICD-10-CM | POA: Diagnosis not present

## 2015-08-18 DIAGNOSIS — R300 Dysuria: Secondary | ICD-10-CM

## 2015-08-18 DIAGNOSIS — R10A2 Flank pain, left side: Secondary | ICD-10-CM

## 2015-08-18 DIAGNOSIS — Z3202 Encounter for pregnancy test, result negative: Secondary | ICD-10-CM | POA: Insufficient documentation

## 2015-08-18 HISTORY — DX: Dorsalgia, unspecified: M54.9

## 2015-08-18 LAB — URINALYSIS COMPLETE WITH MICROSCOPIC (ARMC ONLY)
BILIRUBIN URINE: NEGATIVE
Bacteria, UA: NONE SEEN
GLUCOSE, UA: NEGATIVE mg/dL
KETONES UR: NEGATIVE mg/dL
Leukocytes, UA: NEGATIVE
Nitrite: NEGATIVE
PROTEIN: NEGATIVE mg/dL
Specific Gravity, Urine: 1.029 (ref 1.005–1.030)
pH: 5 (ref 5.0–8.0)

## 2015-08-18 LAB — PREGNANCY, URINE: Preg Test, Ur: NEGATIVE

## 2015-08-18 MED ORDER — ONDANSETRON HCL 4 MG/2ML IJ SOLN
4.0000 mg | Freq: Once | INTRAMUSCULAR | Status: AC
  Administered 2015-08-18: 4 mg via INTRAVENOUS
  Filled 2015-08-18: qty 2

## 2015-08-18 MED ORDER — OXYCODONE-ACETAMINOPHEN 5-325 MG PO TABS
1.0000 | ORAL_TABLET | Freq: Once | ORAL | Status: AC
  Administered 2015-08-18: 1 via ORAL
  Filled 2015-08-18: qty 1

## 2015-08-18 MED ORDER — SODIUM CHLORIDE 0.9 % IV BOLUS (SEPSIS)
1000.0000 mL | Freq: Once | INTRAVENOUS | Status: AC
  Administered 2015-08-18: 1000 mL via INTRAVENOUS

## 2015-08-18 MED ORDER — MORPHINE SULFATE (PF) 4 MG/ML IV SOLN
4.0000 mg | Freq: Once | INTRAVENOUS | Status: AC
  Administered 2015-08-18: 4 mg via INTRAVENOUS
  Filled 2015-08-18: qty 1

## 2015-08-18 NOTE — ED Notes (Signed)
Patient is resting comfortably. 

## 2015-08-18 NOTE — ED Notes (Signed)
Patient transported to CT 

## 2015-08-18 NOTE — ED Provider Notes (Signed)
[facility] Emergency Department Provider Note   ____________________________________________  Time seen:  I have reviewed the triage vital signs and the triage nursing note.  HISTORY  Chief Complaint Flank Pain   Historian Patient  HPI Kristina Cruz is a 54 y.o. female is here with symptoms of dysuria for actually several weeks, but waxing and waning. She is having some left lower flank pain which wraps around into the left lower quadrant. This feels like a prior urinary tract infection to the patient. No history of kidney stones. She finished her menstrual cycle couple days ago. Patient states that over the past couple of days she's had some extension of pain down the back of the left thigh. No known history of sciatica. No exacerbating or alleviating factors. Symptoms are moderate at present.    Past Medical History  Diagnosis Date  . Hypertension   . Back pain     There are no active problems to display for this patient.   No past surgical history on file.  Current Outpatient Rx  Name  Route  Sig  Dispense  Refill  . albuterol (PROVENTIL HFA;VENTOLIN HFA) 108 (90 BASE) MCG/ACT inhaler   Inhalation   Inhale 2 puffs into the lungs every 4 (four) hours as needed for wheezing.         . hydrochlorothiazide (HYDRODIURIL) 25 MG tablet   Oral   Take 25 mg by mouth at bedtime.         . predniSONE (DELTASONE) 20 MG tablet   Oral   Take 3 tablets (60 mg total) by mouth daily with breakfast.   12 tablet   0   . ranitidine (ZANTAC) 150 MG tablet   Oral   Take 150 mg by mouth every morning.         Marland Kitchen. rOPINIRole (REQUIP) 0.25 MG tablet   Oral   Take 1 mg by mouth at bedtime.           Allergies Contrast media and Other  Family History  Problem Relation Age of Onset  . Breast cancer Mother [id number]  . Ovarian cancer Paternal Aunt     ?    Social History Social History  Substance Use Topics  . Smoking status: Never Smoker   .  Smokeless tobacco: Not on file  . Alcohol Use: No    Review of Systems  Constitutional: Negative for fever. Eyes: Negative for visual changes. ENT: Negative for sore throat. Cardiovascular: Negative for chest pain. Respiratory: Negative for shortness of breath. Gastrointestinal: Negative for  vomiting and diarrhea. Genitourinary: Negative for dysuria. Musculoskeletal: Positive for left flank pain. Skin: Negative for rash. Neurological: Negative for headache. 10 point Review of Systems otherwise negative ____________________________________________   PHYSICAL EXAM:  VITAL SIGNS: ED Triage Vitals  Enc Vitals Group     BP 08/18/15 1747 150/74 mmHg     Pulse Rate 08/18/15 1747 68     Resp 08/18/15 1747 20     Temp 08/18/15 1747 97.9 F (36.6 C)     Temp Source 08/18/15 1747 Oral     SpO2 08/18/15 1747 100 %     Weight 08/18/15 1747 319 lb (144.697 kg)     Height 08/18/15 1747 5\' 3"  (1.6 m)     Head Cir --      Peak Flow --      Pain Score 08/18/15 1747 10     Pain Loc --      Pain Edu? --  Excl. in GC? --      Constitutional: Alert and oriented. Well appearing and in no distress. Eyes: Conjunctivae are normal. PERRL. Normal extraocular movements. ENT   Head: Normocephalic and atraumatic.   Nose: No congestion/rhinnorhea.   Mouth/Throat: Mucous membranes are moist.   Neck: No stridor. Cardiovascular/Chest: Normal rate, regular rhythm.  No murmurs, rubs, or gallops. Respiratory: Normal respiratory effort without tachypnea nor retractions. Breath sounds are clear and equal bilaterally. No wheezes/rales/rhonchi. Gastrointestinal: Soft. No distention, no guarding, no rebound. Nontender.  Morbidly obese  Genitourinary/rectal:Deferred Musculoskeletal: Nontender with normal range of motion in all extremities. No joint effusions.  No lower extremity tenderness.  No edema. Neurologic:  Normal speech and language. No gross or focal neurologic deficits are  appreciated. Skin:  Skin is warm, dry and intact. No rash noted. Psychiatric: Mood and affect are normal. Speech and behavior are normal. Patient exhibits appropriate insight and judgment.  ____________________________________________   EKG I, Governor Rooks, MD, the attending physician have personally viewed and interpreted all ECGs.  None ____________________________________________  LABS (pertinent positives/negatives)  Urinalysis 6-30 red blood cells and otherwise negative Urine pregnancy negative CBC and metabolic panel are pending  ____________________________________________  RADIOLOGY All Xrays were viewed by me. Imaging interpreted by Radiologist.  CT scan pending __________________________________________  PROCEDURES  Procedure(s) performed: None  Critical Care performed: None  ____________________________________________   ED COURSE / ASSESSMENT AND PLAN  CONSULTATIONS: None  Pertinent labs & imaging results that were available during my care of the patient were reviewed by me and considered in my medical decision making (see chart for details).   Patient feels like her symptoms are due to the urinary tract system, if this is the case, her urinalysis is negative for signs of UTI, but positive for blood. She's never had a case #4, and on her exam she is complaining of some pain going down the left posterior thigh which makes me think a little bit more about possible sciatica. Given that this is a first-time left flank pain with hematuria, I did discuss with her obtaining a CT scan for diagnostic purposes.  Patient care transferred to Dr. Manson Passey. CT scan is pending. Labs are pending.  Patient / Family / Caregiver informed of clinical course, medical decision-making process, and agree with plan.   I discussed return precautions, follow-up instructions, and discharged instructions with patient and/or  family.  ___________________________________________   FINAL CLINICAL IMPRESSION(S) / ED DIAGNOSES   Final diagnoses:  Dysuria  Left flank pain       Governor Rooks, MD 08/18/15 2328

## 2015-08-18 NOTE — ED Notes (Signed)
Spoke with Dr Scotty CourtStafford regarding pt's presenting symptoms/complaints; no order given for IV or radiology studies; pt to be re-evaluated by triage nurse

## 2015-08-18 NOTE — ED Notes (Signed)
Pt here for left flank pain.  Believes she has UTI.  Denies painful or burning urination.  Pain increases with movement.  Pt reports this feels like her previous "kidney infection"

## 2015-08-19 LAB — CBC WITH DIFFERENTIAL/PLATELET
BASOS ABS: 0.1 10*3/uL (ref 0–0.1)
BASOS PCT: 1 %
EOS PCT: 5 %
Eosinophils Absolute: 0.4 10*3/uL (ref 0–0.7)
HCT: 37.4 % (ref 35.0–47.0)
Hemoglobin: 12.4 g/dL (ref 12.0–16.0)
LYMPHS PCT: 34 %
Lymphs Abs: 2.8 10*3/uL (ref 1.0–3.6)
MCH: 27.8 pg (ref 26.0–34.0)
MCHC: 33 g/dL (ref 32.0–36.0)
MCV: 84.2 fL (ref 80.0–100.0)
Monocytes Absolute: 0.6 10*3/uL (ref 0.2–0.9)
Monocytes Relative: 8 %
NEUTROS ABS: 4.3 10*3/uL (ref 1.4–6.5)
Neutrophils Relative %: 52 %
Platelets: 347 10*3/uL (ref 150–440)
RBC: 4.45 MIL/uL (ref 3.80–5.20)
RDW: 15.1 % — ABNORMAL HIGH (ref 11.5–14.5)
WBC: 8.1 10*3/uL (ref 3.6–11.0)

## 2015-08-19 LAB — BASIC METABOLIC PANEL
Anion gap: 8 (ref 5–15)
BUN: 22 mg/dL — ABNORMAL HIGH (ref 6–20)
CALCIUM: 9.1 mg/dL (ref 8.9–10.3)
CO2: 30 mmol/L (ref 22–32)
CREATININE: 0.81 mg/dL (ref 0.44–1.00)
Chloride: 105 mmol/L (ref 101–111)
GFR calc non Af Amer: >60 mL/min (ref >60)
GLUCOSE: 96 mg/dL (ref 65–99)
Potassium: 4.1 mmol/L (ref 3.5–5.1)
Sodium: 143 mmol/L (ref 135–145)

## 2015-08-19 MED ORDER — KETOROLAC TROMETHAMINE 30 MG/ML IJ SOLN
30.0000 mg | Freq: Once | INTRAMUSCULAR | Status: AC
  Administered 2015-08-19: 30 mg via INTRAVENOUS
  Filled 2015-08-19: qty 1

## 2015-08-19 MED ORDER — OXYCODONE-ACETAMINOPHEN 5-325 MG PO TABS: 1.0000 | ORAL_TABLET | ORAL | Status: DC | PRN

## 2015-08-19 MED ORDER — OXYCODONE-ACETAMINOPHEN 5-325 MG PO TABS
2.0000 | ORAL_TABLET | Freq: Once | ORAL | Status: AC
  Administered 2015-08-19: 2 via ORAL
  Filled 2015-08-19: qty 2

## 2015-08-19 NOTE — Discharge Instructions (Signed)
Lumbosacral Radiculopathy °Lumbosacral radiculopathy is a condition that involves the spinal nerves and nerve roots in the low back and bottom of the spine. The condition develops when these nerves and nerve roots move out of place or become inflamed and cause symptoms. °CAUSES °This condition may be caused by: °· Pressure from a disk that bulges out of place (herniated disk). A disk is a plate of cartilage that separates bones in the spine. °· Disk degeneration. °· A narrowing of the bones of the lower back (spinal stenosis). °· A tumor. °· An infection. °· An injury that places sudden pressure on the disks that cushion the bones of your lower spine. °RISK FACTORS °This condition is more likely to develop in: °· Males aged 30-50 years. °· Females aged 50-60 years. °· People who lift improperly. °· People who are overweight or live a sedentary lifestyle. °· People who smoke. °· People who perform repetitive activities that strain the spine. °SYMPTOMS °Symptoms of this condition include: °· Pain that goes down from the back into the legs (sciatica). This is the most common symptom. The pain may be worse with sitting, coughing, or sneezing. °· Pain and numbness in the arms and legs. °· Muscle weakness. °· Tingling. °· Loss of bladder control or bowel control. °DIAGNOSIS °This condition is diagnosed with a physical exam and medical history. If the pain is lasting, you may have tests, such as: °· MRI scan. °· X-ray. °· CT scan. °· Myelogram. °· Nerve conduction study. °TREATMENT °This condition is often treated with: °· Hot packs and ice applied to affected areas. °· Stretches to improve flexibility. °· Exercises to strengthen back muscles. °· Physical therapy. °· Pain medicine. °· A steroid injection in the spine. °In some cases, no treatment is needed. If the condition is long-lasting (chronic), or if symptoms are severe, treatment may involve surgery or lifestyle changes, such as following a weight loss plan. °HOME  CARE INSTRUCTIONS °Medicines °· Take medicines only as directed by your health care provider. °· Do not drive or operate heavy machinery while taking pain medicine. °Injury Care °· Apply a heat pack to the injured area as directed by your health care provider. °· Apply ice to the affected area: °¨ Put ice in a plastic bag. °¨ Place a towel between your skin and the bag. °¨ Leave the ice on for 20-30 minutes, every 2 hours while you are awake or as needed. Or, leave the ice on for as long as directed by your health care provider. °Other Instructions °· If you were shown how to do any exercises or stretches, do them as directed by your health care provider. °· If your health care provider prescribed a diet or exercise program, follow it as directed. °· Keep all follow-up visits as directed by your health care provider. This is important. °SEEK MEDICAL CARE IF: °· Your pain does not improve over time even when taking pain medicines. °SEEK IMMEDIATE MEDICAL CARE IF: °· Your develop severe pain. °· Your pain suddenly gets worse. °· You develop increasing weakness in your legs. °· You lose the ability to control your bladder or bowel. °· You have difficulty walking or balancing. °· You have a fever. °  °This information is not intended to replace advice given to you by your health care provider. Make sure you discuss any questions you have with your health care provider. °  °Document Released: 08/04/2005 Document Revised: 12/19/2014 Document Reviewed: 07/31/2014 °Elsevier Interactive Patient Education ©2016 [employer] ° °

## 2015-08-19 NOTE — ED Notes (Signed)
Pt discharged to home with family driving.  Discharge instructions reviewed.  No questions or concerns at this time.  Pt voiced understanding.  No items left in ED.  Pt in NAD.

## 2015-08-19 NOTE — ED Provider Notes (Signed)
I assumed care of the patient from Dr. Shaune PollackLord CT abdomen revealed: CT Renal Stone Study (Final result) Result time: 08/19/15 00:23:04   Final result by Rad Results In Interface (08/19/15 00:23:04)   Narrative:   CLINICAL DATA: Initial evaluation for intermittent left flank pain for several weeks.  EXAM: CT ABDOMEN AND PELVIS WITHOUT CONTRAST  TECHNIQUE: Multidetector CT imaging of the abdomen and pelvis was performed following the standard protocol without IV contrast.  COMPARISON: Prior radiograph from 12/21/2014.  FINDINGS: Visualized lung bases are clear.  Limited noncontrast evaluation of the liver is unremarkable. Gallbladder is absent. No biliary dilatation. Spleen, adrenal glands, and pancreas demonstrate a normal unenhanced appearance.  Kidneys are equal in size without evidence of nephrolithiasis or hydronephrosis. No radiopaque calculi seen along the course of either renal collecting system. There is no hydroureter.  Stomach within normal limits. No evidence for bowel obstruction. No abnormal wall thickening or inflammatory fat stranding seen about the bowels. Mild colonic diverticulosis without evidence for acute diverticulitis. No evidence for acute appendicitis.  Bladder within normal limits. Uterus and ovaries are normal.  No free air or fluid. No pathologically enlarged intra-abdominal or pelvic lymph nodes.  Moderate degenerative spondylolysis with disc desiccation present at L3-4 and L4-5. Additional degenerative changes present at L2-3 and L5-S1. Diffusely flowing bridging bulky osteophytic spurring within the visualized spine, suggestive of DISH. No acute osseous abnormality. No worrisome lytic or blastic osseous lesions. Sclerosis noted about the SI joints bilaterally.  IMPRESSION: 1. No CT evidence for nephrolithiasis or obstructive uropathy. 2. No other acute intra-abdominal or pelvic process. 3. Mild colonic diverticulosis without evidence for  acute diverticulitis. 4. Moderate to advance degenerative spondylolysis and facet arthrosis at L2-3 through L5-S1.   Electronically Signed By: Rise MuBenjamin McClintock M.D. On: 08/19/2015 00:23      I informed the patient of all clinical findings including those of the CT scan. Patient will be referred to Dr. Ernest [facility] on call for further evaluation and management on the outpatient setting.  Kristina Currentandolph N Windy Dudek, MD 08/19/15 [phone]

## 2016-05-20 ENCOUNTER — Encounter: Payer: Self-pay | Admitting: Emergency Medicine

## 2016-05-20 ENCOUNTER — Emergency Department
Admission: EM | Admit: 2016-05-20 | Discharge: 2016-05-20 | Disposition: A | Discharge status: Home / Self Care | Payer: Medicare Other | Attending: Emergency Medicine | Admitting: Emergency Medicine

## 2016-05-20 DIAGNOSIS — Z79899 Other long term (current) drug therapy: Secondary | ICD-10-CM | POA: Insufficient documentation

## 2016-05-20 DIAGNOSIS — I10 Essential (primary) hypertension: Secondary | ICD-10-CM | POA: Insufficient documentation

## 2016-05-20 DIAGNOSIS — M5410 Radiculopathy, site unspecified: Secondary | ICD-10-CM

## 2016-05-20 DIAGNOSIS — M79662 Pain in left lower leg: Secondary | ICD-10-CM | POA: Diagnosis present

## 2016-05-20 MED ORDER — OXYCODONE-ACETAMINOPHEN 7.5-325 MG PO TABS: 1.0000 | ORAL_TABLET | ORAL | 0 refills | Status: AC | PRN

## 2016-05-20 MED ORDER — DEXAMETHASONE SODIUM PHOSPHATE 10 MG/ML IJ SOLN
10.0000 mg | Freq: Once | INTRAMUSCULAR | Status: AC
  Administered 2016-05-20: 10 mg via INTRAMUSCULAR
  Filled 2016-05-20: qty 1

## 2016-05-20 MED ORDER — METHYLPREDNISOLONE 4 MG PO TBPK: ORAL_TABLET | ORAL | 0 refills | Status: DC

## 2016-05-20 MED ORDER — HYDROMORPHONE HCL 1 MG/ML IJ SOLN
1.0000 mg | Freq: Once | INTRAMUSCULAR | Status: AC
  Administered 2016-05-20: 1 mg via INTRAMUSCULAR
  Filled 2016-05-20: qty 1

## 2016-05-20 MED ORDER — METHOCARBAMOL 500 MG PO TABS
1000.0000 mg | ORAL_TABLET | Freq: Once | ORAL | Status: AC
  Administered 2016-05-20: 1000 mg via ORAL
  Filled 2016-05-20: qty 2

## 2016-05-20 MED ORDER — METHOCARBAMOL 750 MG PO TABS: 750.0000 mg | ORAL_TABLET | Freq: Four times a day (QID) | ORAL | 0 refills | Status: DC

## 2016-05-20 NOTE — ED Provider Notes (Signed)
[facility] Emergency Department Provider Note   ____________________________________________   None    (approximate)  I have reviewed the triage vital signs and the nursing notes.   HISTORY  Chief Complaint Back Pain    HPI Kristina Cruz is a 55 y.o. female patient complaining of radicular back pain to the left lower extremity for 1 week. Patient denies any bladder or bowel dysfunction. Patient has a history of degenerative back disease. Patient states she's having numbness to the left lower extremity. Patient states she is using over-the-counter anti-inflammatory medications and some muscle relaxants leftover from a previous prescription. Patient stated medication has not relieved her complaint.Patient rates the pain as a 10 over 10. Patient described a sharp pain to the back and the numbness sensation to the left lower extremity. No other palliative measures for complaint. Patient does not discuss her complaint with her family doctor.   Past Medical History:  Diagnosis Date  . Back pain   . Hypertension     There are no active problems to display for this patient.   History reviewed. No pertinent surgical history.  Prior to Admission medications   Medication Sig Start Date End Date Taking? Authorizing Provider  albuterol (PROVENTIL HFA;VENTOLIN HFA) 108 (90 BASE) MCG/ACT inhaler Inhale 2 puffs into the lungs every 4 (four) hours as needed for wheezing.    Historical Provider, MD  hydrochlorothiazide (HYDRODIURIL) 25 MG tablet Take 25 mg by mouth at bedtime.    Historical Provider, MD  methocarbamol (ROBAXIN-750) 750 MG tablet Take 1 tablet (750 mg total) by mouth 4 (four) times daily. 05/20/16   Joni Reining, PA-C  methylPREDNISolone (MEDROL DOSEPAK) 4 MG TBPK tablet Take Tapered dose as directed 05/20/16   Joni Reining, PA-C  oxyCODONE-acetaminophen (PERCOCET) 7.5-325 MG tablet Take 1 tablet by mouth every 4 (four) hours as needed for  severe pain. 05/20/16 05/20/17  Joni Reining, PA-C  oxyCODONE-acetaminophen (PERCOCET/ROXICET) 5-325 MG tablet Take 1 tablet by mouth every 4 (four) hours as needed for severe pain. 08/19/15   Darci Current, MD  predniSONE (DELTASONE) 20 MG tablet Take 3 tablets (60 mg total) by mouth daily with breakfast. 02/17/15   Myrna Blazer, MD  ranitidine (ZANTAC) 150 MG tablet Take 150 mg by mouth every morning.    Historical Provider, MD  rOPINIRole (REQUIP) 0.25 MG tablet Take 1 mg by mouth at bedtime.    Historical Provider, MD    Allergies Contrast media [iodinated diagnostic agents] and Other  Family History  Problem Relation Age of Onset  . Breast cancer Mother 76  . Ovarian cancer Paternal Aunt     ?    Social History Social History  Substance Use Topics  . Smoking status: Never Smoker  . Smokeless tobacco: Never Used  . Alcohol use No    Review of Systems Constitutional: No fever/chills Eyes: No visual changes. ENT: No sore throat. Cardiovascular: Denies chest pain. Respiratory: Denies shortness of breath. Gastrointestinal: No abdominal pain.  No nausea, no vomiting.  No diarrhea.  No constipation. Genitourinary: Negative for dysuria. Musculoskeletal: Positive for back pain. Skin: Negative for rash. Neurological: Negative for headaches, focal weakness or numbness.    ____________________________________________   PHYSICAL EXAM:  VITAL SIGNS: ED Triage Vitals  Enc Vitals Group     BP 05/20/16 0806 114/74     Pulse Rate 05/20/16 0805 91     Resp 05/20/16 0804 18     Temp 05/20/16 0804 98 F (36.7  C)     Temp Source 05/20/16 0804 Oral     SpO2 05/20/16 0805 96 %     Weight 05/20/16 0805 (!) 333 lb (151 kg)     Height 05/20/16 0805 5\' 1"  (1.549 m)     Head Circumference --      Peak Flow --      Pain Score 05/20/16 0802 10     Pain Loc --      Pain Edu? --      Excl. in GC? --     Constitutional: Alert and oriented. Well appearing and in no acute  distress.Morbid obesity Eyes: Conjunctivae are normal. PERRL. EOMI. Head: Atraumatic. Nose: No congestion/rhinnorhea. Mouth/Throat: Mucous membranes are moist.  Oropharynx non-erythematous. Neck: No stridor.  No cervical spine tenderness to palpation. Hematological/Lymphatic/Immunilogical: No cervical lymphadenopathy. Cardiovascular: Normal rate, regular rhythm. Grossly normal heart sounds.  Good peripheral circulation. Respiratory: Normal respiratory effort.  No retractions. Lungs CTAB. Gastrointestinal: Soft and nontender. No distention. No abdominal bruits. No CVA tenderness. Musculoskeletal: Exam is limited by patient's habitus. Patient has some moderate guarding L4-S1. Patient sits to stand with reliance on upper extremities. Patient has decreased range of motion's all fields. Patient has negative straight leg test.  Neurologic:  Normal speech and language. No gross focal neurologic deficits are appreciated. No gait instability. Skin:  Skin is warm, dry and intact. No rash noted. Psychiatric: Mood and affect are normal. Speech and behavior are normal.  ____________________________________________   LABS (all labs ordered are listed, but only abnormal results are displayed)  Labs Reviewed - No data to display ____________________________________________  EKG   ____________________________________________  RADIOLOGY   ____________________________________________   PROCEDURES  Procedure(s) performed: None  Procedures  Critical Care performed: No  ____________________________________________   INITIAL IMPRESSION / ASSESSMENT AND PLAN / ED COURSE  Pertinent labs & imaging results that were available during my care of the patient were reviewed by me and considered in my medical decision making (see chart for details).  Radicular back pain. Patient given discharge instructions. Patient given a prescription for Medrol Dosepak, Percocet, and Robaxin. Patient advised to  follow-up with family doctor for continued care.  Clinical Course     ____________________________________________   FINAL CLINICAL IMPRESSION(S) / ED DIAGNOSES  Final diagnoses:  Radicular pain of lower extremity      NEW MEDICATIONS STARTED DURING THIS VISIT:  New Prescriptions   METHOCARBAMOL (ROBAXIN-750) 750 MG TABLET    Take 1 tablet (750 mg total) by mouth 4 (four) times daily.   METHYLPREDNISOLONE (MEDROL DOSEPAK) 4 MG TBPK TABLET    Take Tapered dose as directed   OXYCODONE-ACETAMINOPHEN (PERCOCET) 7.5-325 MG TABLET    Take 1 tablet by mouth every 4 (four) hours as needed for severe pain.     Note:  This document was prepared using Dragon voice recognition software and may include unintentional dictation errors.    Joni Reiningonald K Deashia Soule, PA-C 05/20/16 [id number]    Sharman CheekPhillip Stafford, MD 05/21/16 [phone]

## 2016-05-20 NOTE — ED Notes (Signed)
Presents via w/c from lobby  Having lower back pain which moves into both legs  Denies any recent injury hx of back problems in past ..increased pain with movement

## 2016-05-20 NOTE — ED Triage Notes (Signed)
Says back pain x 1 week.  Says she has had back problems, but feels like a pinched nerve.

## 2016-06-30 ENCOUNTER — Other Ambulatory Visit: Payer: Self-pay | Admitting: Orthopedic Surgery

## 2016-06-30 ENCOUNTER — Ambulatory Visit
Admission: RE | Admit: 2016-06-30 | Discharge: 2016-06-30 | Disposition: A | Discharge status: Home / Self Care | Payer: Medicare Other | Source: Ambulatory Visit | Attending: Orthopedic Surgery | Admitting: Orthopedic Surgery

## 2016-06-30 DIAGNOSIS — R609 Edema, unspecified: Secondary | ICD-10-CM

## 2016-06-30 DIAGNOSIS — M7989 Other specified soft tissue disorders: Secondary | ICD-10-CM | POA: Insufficient documentation

## 2016-06-30 DIAGNOSIS — M79605 Pain in left leg: Secondary | ICD-10-CM | POA: Diagnosis not present

## 2016-07-01 ENCOUNTER — Other Ambulatory Visit: Payer: Self-pay | Admitting: Student

## 2016-07-01 DIAGNOSIS — M5442 Lumbago with sciatica, left side: Principal | ICD-10-CM

## 2016-07-01 DIAGNOSIS — G8929 Other chronic pain: Secondary | ICD-10-CM

## 2016-07-17 ENCOUNTER — Ambulatory Visit: Payer: Medicare Other

## 2016-07-23 ENCOUNTER — Ambulatory Visit: Admission: RE | Admit: 2016-07-23 | Payer: Medicare Other | Source: Ambulatory Visit

## 2016-08-06 ENCOUNTER — Ambulatory Visit: Payer: Medicare Other

## 2016-08-21 ENCOUNTER — Ambulatory Visit: Admission: RE | Admit: 2016-08-21 | Payer: Medicare Other | Source: Ambulatory Visit

## 2017-01-14 ENCOUNTER — Encounter: Payer: Self-pay | Admitting: *Deleted

## 2017-01-14 DIAGNOSIS — M25561 Pain in right knee: Secondary | ICD-10-CM | POA: Insufficient documentation

## 2017-01-14 DIAGNOSIS — Y929 Unspecified place or not applicable: Secondary | ICD-10-CM | POA: Diagnosis not present

## 2017-01-14 DIAGNOSIS — Z79899 Other long term (current) drug therapy: Secondary | ICD-10-CM | POA: Insufficient documentation

## 2017-01-14 DIAGNOSIS — I10 Essential (primary) hypertension: Secondary | ICD-10-CM | POA: Diagnosis not present

## 2017-01-14 DIAGNOSIS — Y939 Activity, unspecified: Secondary | ICD-10-CM | POA: Insufficient documentation

## 2017-01-14 DIAGNOSIS — G8929 Other chronic pain: Secondary | ICD-10-CM | POA: Diagnosis not present

## 2017-01-14 DIAGNOSIS — S80821A Blister (nonthermal), right lower leg, initial encounter: Secondary | ICD-10-CM | POA: Insufficient documentation

## 2017-01-14 DIAGNOSIS — X58XXXA Exposure to other specified factors, initial encounter: Secondary | ICD-10-CM | POA: Insufficient documentation

## 2017-01-14 DIAGNOSIS — Y999 Unspecified external cause status: Secondary | ICD-10-CM | POA: Diagnosis not present

## 2017-01-14 DIAGNOSIS — R609 Edema, unspecified: Secondary | ICD-10-CM | POA: Insufficient documentation

## 2017-01-14 NOTE — ED Triage Notes (Signed)
Pt has blister to right lower leg. Sx for 1 day  Pt reports pain and swelling to feet.  No chest pain  No sob.  Pt ambulates with a cane.  Pt alert.  Speech clear.

## 2017-01-15 ENCOUNTER — Emergency Department
Admission: EM | Admit: 2017-01-15 | Discharge: 2017-01-15 | Disposition: A | Discharge status: Home / Self Care | Payer: Medicare Other | Attending: Emergency Medicine | Admitting: Emergency Medicine

## 2017-01-15 DIAGNOSIS — S80821A Blister (nonthermal), right lower leg, initial encounter: Secondary | ICD-10-CM | POA: Diagnosis not present

## 2017-01-15 DIAGNOSIS — T148XXA Other injury of unspecified body region, initial encounter: Secondary | ICD-10-CM

## 2017-01-15 DIAGNOSIS — R609 Edema, unspecified: Secondary | ICD-10-CM

## 2017-01-15 DIAGNOSIS — M25561 Pain in right knee: Secondary | ICD-10-CM

## 2017-01-15 DIAGNOSIS — G8929 Other chronic pain: Secondary | ICD-10-CM

## 2017-01-15 MED ORDER — FUROSEMIDE 20 MG PO TABS: 20.0000 mg | ORAL_TABLET | Freq: Every day | ORAL | 0 refills | Status: DC

## 2017-01-15 MED ORDER — OXYCODONE-ACETAMINOPHEN 5-325 MG PO TABS
1.0000 | ORAL_TABLET | Freq: Once | ORAL | Status: AC
Start: 2017-01-15 — End: 2017-01-15
  Administered 2017-01-15: 1 via ORAL
  Filled 2017-01-15: qty 1

## 2017-01-15 MED ORDER — FUROSEMIDE 40 MG PO TABS
20.0000 mg | ORAL_TABLET | Freq: Once | ORAL | Status: AC
  Administered 2017-01-15: 20 mg via ORAL
  Filled 2017-01-15: qty 1

## 2017-01-15 NOTE — ED Provider Notes (Signed)
[facility] Emergency Department Provider Note   ____________________________________________   First MD Initiated Contact with Patient 01/15/17 0215     (approximate)  I have reviewed the triage vital signs and the nursing notes.   HISTORY  Chief Complaint Blister and Leg Swelling    HPI Kristina Cruz is a 56 y.o. female who presents to the ED from home with a chief complaint of blister to her right lower leg. Patient has a history of hypertension and reports swelling to her legs for quite some time. She has asked her PCP for Lasix but he placed her on chlorothiadone. Denies injury. Noted blister to her right lower leg yesterday. Legs are painful secondary to swelling. Denies associated chest pain, shortness of breath, abdominal pain, nausea, vomiting, diarrhea. Nothing makes her symptoms better or worse. She is on her feet a lot during the day and has not elevated them.   Past Medical History:  Diagnosis Date  . Back pain   . Hypertension     There are no active problems to display for this patient.   No past surgical history on file.  Prior to Admission medications   Medication Sig Start Date End Date Taking? Authorizing Provider  albuterol (PROVENTIL HFA;VENTOLIN HFA) 108 (90 BASE) MCG/ACT inhaler Inhale 2 puffs into the lungs every 4 (four) hours as needed for wheezing.    [provider]  hydrochlorothiazide (HYDRODIURIL) 25 MG tablet Take 25 mg by mouth at bedtime.    [provider]  methocarbamol (ROBAXIN-750) 750 MG tablet Take 1 tablet (750 mg total) by mouth 4 (four) times daily. 05/20/16   Joni ReiningSmith, Ronald K, PA-C  methylPREDNISolone (MEDROL DOSEPAK) 4 MG TBPK tablet Take Tapered dose as directed 05/20/16   Joni ReiningSmith, Ronald K, PA-C  oxyCODONE-acetaminophen (PERCOCET) 7.5-325 MG tablet Take 1 tablet by mouth every 4 (four) hours as needed for severe pain. 05/20/16 05/20/17  Joni ReiningSmith, Ronald K, PA-C  oxyCODONE-acetaminophen  (PERCOCET/ROXICET) 5-325 MG tablet Take 1 tablet by mouth every 4 (four) hours as needed for severe pain. 08/19/15   Darci CurrentBrown, Crowder N, MD  predniSONE (DELTASONE) 20 MG tablet Take 3 tablets (60 mg total) by mouth daily with breakfast. 02/17/15   Myrna BlazerSchaevitz, David Matthew, MD  ranitidine (ZANTAC) 150 MG tablet Take 150 mg by mouth every morning.    [provider]  rOPINIRole (REQUIP) 0.25 MG tablet Take 1 mg by mouth at bedtime.    [provider]    Allergies Contrast media [iodinated diagnostic agents] and Other  Family History  Problem Relation Age of Onset  . Breast cancer Mother 1963  . Ovarian cancer Paternal Aunt        ?    Social History Social History  Substance Use Topics  . Smoking status: Never Smoker  . Smokeless tobacco: Never Used  . Alcohol use No    Review of Systems  Constitutional: No fever/chills. Eyes: No visual changes. ENT: No sore throat. Cardiovascular: Denies chest pain. Respiratory: Denies shortness of breath. Gastrointestinal: No abdominal pain.  No nausea, no vomiting.  No diarrhea.  No constipation. Genitourinary: Negative for dysuria. Musculoskeletal: Positive for bilateral lower extremity edema. Positive for chronic right knee pain. Negative for back pain. Skin: Negative for rash. Neurological: Negative for headaches, focal weakness or numbness.   ____________________________________________   PHYSICAL EXAM:  VITAL SIGNS: ED Triage Vitals  Enc Vitals Group     BP 01/14/17 2339 (!) 163/77     Pulse Rate 01/14/17 2339 82  Resp 01/14/17 2339 20     Temp 01/14/17 2339 98.7 F (37.1 C)     Temp Source 01/14/17 2339 Oral     SpO2 01/14/17 2339 99 %     Weight 01/14/17 2336 (!) 360 lb (163.3 kg)     Height 01/14/17 2336 5\' 5"  (1.651 m)     Head Circumference --      Peak Flow --      Pain Score 01/14/17 2336 9     Pain Loc --      Pain Edu? --      Excl. in GC? --     Constitutional: Alert and oriented. Well  appearing and in no acute distress. Eyes: Conjunctivae are normal. PERRL. EOMI. Head: Atraumatic. Nose: No congestion/rhinnorhea. Mouth/Throat: Mucous membranes are moist.  Oropharynx non-erythematous. Neck: No stridor.   Cardiovascular: Normal rate, regular rhythm. Grossly normal heart sounds.  Good peripheral circulation. Respiratory: Normal respiratory effort.  No retractions. Lungs CTAB. No rales. Gastrointestinal: Soft and nontender. No distention. No abdominal bruits. No CVA tenderness. Musculoskeletal: Quarter-sized blister to right lower leg. 2+ BLE nonpitting edema. Chronic right knee pain. Full range of motion with some pain. Supple calves without evidence for compartment syndrome. Symmetrically warm legs without evidence for ischemia. 2+ distal pulses. Neurologic:  Normal speech and language. No gross focal neurologic deficits are appreciated. Ambulates with cane at baseline.  Skin:  Skin is warm, dry and intact. No rash noted. Psychiatric: Mood and affect are normal. Speech and behavior are normal.  ____________________________________________   LABS (all labs ordered are listed, but only abnormal results are displayed)  Labs Reviewed - No data to display ____________________________________________  EKG  None ____________________________________________  RADIOLOGY  None ____________________________________________   PROCEDURES  Procedure(s) performed: None  Procedures  Critical Care performed: No  ____________________________________________   INITIAL IMPRESSION / ASSESSMENT AND PLAN / ED COURSE  Pertinent labs & imaging results that were available during my care of the patient were reviewed by me and considered in my medical decision making (see chart for details).  56 year old female who presents with a blister on her right lower leg secondary to edema. Denies history of renal insufficiency. Will place on a three-day course of low-dose Lasix. Will refer  patient to orthopedics to evaluate chronic right knee pain. Strict return precautions given. Patient verbalizes understanding and agrees with plan of care.      ____________________________________________   FINAL CLINICAL IMPRESSION(S) / ED DIAGNOSES  Final diagnoses:  Peripheral edema  Blister  Chronic pain of right knee      NEW MEDICATIONS STARTED DURING THIS VISIT:  New Prescriptions   No medications on file     Note:  This document was prepared using Dragon voice recognition software and may include unintentional dictation errors.    Irean Hong, MD 01/15/17 [phone]

## 2017-01-15 NOTE — Discharge Instructions (Signed)
1. Take Lasix 20 mg daily on Friday and Saturday. 2. Elevate legs whenever possible. 3. Return to the ER for worsening symptoms, persistent vomiting, difficulty breathing or other concerns.

## 2017-01-20 ENCOUNTER — Ambulatory Visit: Payer: Self-pay | Admitting: Nurse Practitioner

## 2017-01-29 ENCOUNTER — Other Ambulatory Visit: Payer: Self-pay | Admitting: Family Medicine

## 2017-01-29 DIAGNOSIS — Z1231 Encounter for screening mammogram for malignant neoplasm of breast: Secondary | ICD-10-CM

## 2017-03-12 ENCOUNTER — Encounter: Payer: Self-pay | Admitting: Emergency Medicine

## 2017-03-12 ENCOUNTER — Emergency Department
Admission: EM | Admit: 2017-03-12 | Discharge: 2017-03-12 | Disposition: A | Discharge status: Home / Self Care | Payer: Medicare Other | Attending: Student in an Organized Health Care Education/Training Program | Admitting: Student in an Organized Health Care Education/Training Program

## 2017-03-12 ENCOUNTER — Emergency Department: Payer: Medicare Other

## 2017-03-12 DIAGNOSIS — R9389 Abnormal findings on diagnostic imaging of other specified body structures: Secondary | ICD-10-CM

## 2017-03-12 DIAGNOSIS — R1032 Left lower quadrant pain: Secondary | ICD-10-CM | POA: Insufficient documentation

## 2017-03-12 DIAGNOSIS — R102 Pelvic and perineal pain: Secondary | ICD-10-CM

## 2017-03-12 DIAGNOSIS — K5900 Constipation, unspecified: Secondary | ICD-10-CM | POA: Insufficient documentation

## 2017-03-12 DIAGNOSIS — R197 Diarrhea, unspecified: Secondary | ICD-10-CM | POA: Diagnosis not present

## 2017-03-12 DIAGNOSIS — R938 Abnormal findings on diagnostic imaging of other specified body structures: Secondary | ICD-10-CM | POA: Insufficient documentation

## 2017-03-12 DIAGNOSIS — R110 Nausea: Secondary | ICD-10-CM | POA: Insufficient documentation

## 2017-03-12 DIAGNOSIS — G8929 Other chronic pain: Secondary | ICD-10-CM | POA: Diagnosis not present

## 2017-03-12 DIAGNOSIS — R109 Unspecified abdominal pain: Secondary | ICD-10-CM

## 2017-03-12 DIAGNOSIS — Z79899 Other long term (current) drug therapy: Secondary | ICD-10-CM | POA: Insufficient documentation

## 2017-03-12 DIAGNOSIS — I10 Essential (primary) hypertension: Secondary | ICD-10-CM | POA: Diagnosis not present

## 2017-03-12 DIAGNOSIS — M549 Dorsalgia, unspecified: Secondary | ICD-10-CM | POA: Diagnosis not present

## 2017-03-12 LAB — COMPREHENSIVE METABOLIC PANEL
ALT: 14 U/L (ref 14–54)
AST: 15 U/L (ref 15–41)
Albumin: 3.7 g/dL (ref 3.5–5.0)
Alkaline Phosphatase: 80 U/L (ref 38–126)
Anion gap: 8 (ref 5–15)
BILIRUBIN TOTAL: 0.5 mg/dL (ref 0.3–1.2)
BUN: 12 mg/dL (ref 6–20)
CO2: 25 mmol/L (ref 22–32)
Calcium: 8.9 mg/dL (ref 8.9–10.3)
Chloride: 110 mmol/L (ref 101–111)
Creatinine, Ser: 1.01 mg/dL — ABNORMAL HIGH (ref 0.44–1.00)
GFR calc Af Amer: >60 mL/min (ref >60)
Glucose, Bld: 113 mg/dL — ABNORMAL HIGH (ref 65–99)
POTASSIUM: 3.8 mmol/L (ref 3.5–5.1)
Sodium: 143 mmol/L (ref 135–145)
TOTAL PROTEIN: 7.6 g/dL (ref 6.5–8.1)

## 2017-03-12 LAB — URINALYSIS, COMPLETE (UACMP) WITH MICROSCOPIC
BILIRUBIN URINE: NEGATIVE
Bacteria, UA: NONE SEEN
Glucose, UA: NEGATIVE mg/dL
HGB URINE DIPSTICK: NEGATIVE
Ketones, ur: NEGATIVE mg/dL
LEUKOCYTES UA: NEGATIVE
NITRITE: NEGATIVE
PH: 5 (ref 5.0–8.0)
Protein, ur: NEGATIVE mg/dL
SPECIFIC GRAVITY, URINE: 1.021 (ref 1.005–1.030)

## 2017-03-12 LAB — CBC
HCT: 27.9 % — ABNORMAL LOW (ref 35.0–47.0)
Hemoglobin: 8.8 g/dL — ABNORMAL LOW (ref 12.0–16.0)
MCH: 23.7 pg — ABNORMAL LOW (ref 26.0–34.0)
MCHC: 31.4 g/dL — ABNORMAL LOW (ref 32.0–36.0)
MCV: 75.3 fL — ABNORMAL LOW (ref 80.0–100.0)
PLATELETS: 527 10*3/uL — AB (ref 150–440)
RBC: 3.71 MIL/uL — ABNORMAL LOW (ref 3.80–5.20)
RDW: 18 % — AB (ref 11.5–14.5)
WBC: 8.1 10*3/uL (ref 3.6–11.0)

## 2017-03-12 LAB — LIPASE, BLOOD: Lipase: 18 U/L (ref 11–51)

## 2017-03-12 LAB — POCT PREGNANCY, URINE: Preg Test, Ur: NEGATIVE

## 2017-03-12 MED ORDER — TRAMADOL HCL 50 MG PO TABS: 50.0000 mg | ORAL_TABLET | Freq: Four times a day (QID) | ORAL | 0 refills | Status: DC | PRN

## 2017-03-12 MED ORDER — FERROUS GLUCONATE 240 (27 FE) MG PO TABS: 240.0000 mg | ORAL_TABLET | Freq: Three times a day (TID) | ORAL | 0 refills | Status: DC

## 2017-03-12 MED ORDER — HYDROCODONE-ACETAMINOPHEN 5-325 MG PO TABS
1.0000 | ORAL_TABLET | Freq: Once | ORAL | Status: AC
  Administered 2017-03-12: 1 via ORAL
  Filled 2017-03-12: qty 1

## 2017-03-12 NOTE — ED Triage Notes (Signed)
Pt reports LUQ and LLQ abdominal pain with nausea and vomiting for three weeks. Pt reports worse over the last eight days.

## 2017-03-12 NOTE — ED Provider Notes (Addendum)
[facility] Emergency Department Provider Note    First MD Initiated Contact with Patient 03/12/17 1528     (approximate)  I have reviewed the triage vital signs and the nursing notes.   HISTORY  Chief Complaint Abdominal Pain    HPI Kristina Cruz is a 56 y.o. female with a history of chronic back pain presents with 3 weeks of left flank pain radiating down the left groin. Denies any dysuria. No fevers or chills. Is having nausea. Did have 1 episode of nonbloody diarrhea and has had an issues with constipation. Denies any rectal pain. No vaginal discharge. No vaginal bleeding. Never had symptoms like this before. No known history of diverticulitis. Denies any trauma. No chest pain or shortness of breath.   Past Medical History:  Diagnosis Date  . Back pain   . Hypertension    Family History  Problem Relation Age of Onset  . Breast cancer Mother 2  . Ovarian cancer Paternal Aunt        ?   History reviewed. No pertinent surgical history. There are no active problems to display for this patient.     Prior to Admission medications   Medication Sig Start Date End Date Taking? Authorizing Provider  albuterol (PROVENTIL HFA;VENTOLIN HFA) 108 (90 BASE) MCG/ACT inhaler Inhale 2 puffs into the lungs every 4 (four) hours as needed for wheezing.    [provider]  ferrous gluconate (IRON 27) 240 (27 FE) MG tablet Take 1 tablet (240 mg total) by mouth 3 (three) times daily with meals. Take every other day 03/12/17 04/11/17  Willy Eddy, MD  furosemide (LASIX) 20 MG tablet Take 1 tablet (20 mg total) by mouth daily. 01/15/17 01/15/18  Irean Hong, MD  hydrochlorothiazide (HYDRODIURIL) 25 MG tablet Take 25 mg by mouth at bedtime.    [provider]  methocarbamol (ROBAXIN-750) 750 MG tablet Take 1 tablet (750 mg total) by mouth 4 (four) times daily. 05/20/16   Joni Reining, PA-C  methylPREDNISolone (MEDROL DOSEPAK) 4 MG TBPK  tablet Take Tapered dose as directed 05/20/16   Joni Reining, PA-C  oxyCODONE-acetaminophen (PERCOCET) 7.5-325 MG tablet Take 1 tablet by mouth every 4 (four) hours as needed for severe pain. 05/20/16 05/20/17  Joni Reining, PA-C  oxyCODONE-acetaminophen (PERCOCET/ROXICET) 5-325 MG tablet Take 1 tablet by mouth every 4 (four) hours as needed for severe pain. 08/19/15   Darci Current, MD  predniSONE (DELTASONE) 20 MG tablet Take 3 tablets (60 mg total) by mouth daily with breakfast. 02/17/15   Myrna Blazer, MD  ranitidine (ZANTAC) 150 MG tablet Take 150 mg by mouth every morning.    [provider]  rOPINIRole (REQUIP) 0.25 MG tablet Take 1 mg by mouth at bedtime.    [provider]  traMADol (ULTRAM) 50 MG tablet Take 1 tablet (50 mg total) by mouth every 6 (six) hours as needed. 03/12/17 03/12/18  Willy Eddy, MD    Allergies Contrast media [iodinated diagnostic agents] and Other    Social History Social History  Substance Use Topics  . Smoking status: Never Smoker  . Smokeless tobacco: Never Used  . Alcohol use No    Review of Systems Patient denies headaches, rhinorrhea, blurry vision, numbness, shortness of breath, chest pain, edema, cough, abdominal pain, nausea, vomiting, diarrhea, dysuria, fevers, rashes or hallucinations unless otherwise stated above in HPI. ____________________________________________   PHYSICAL EXAM:  VITAL SIGNS: Vitals:   03/12/17 1416 03/12/17 1550  BP: Marland Kitchen)  138/115 (!) 154/94  Pulse: 81 74  Resp: 18 17  Temp: 99 F (37.2 C)     Constitutional: Alert and oriented. Morbidly obese but well appearing and in no acute distress. Eyes: Conjunctivae are normal.  Head: Atraumatic. Nose: No congestion/rhinnorhea. Mouth/Throat: Mucous membranes are moist.   Neck: No stridor. Painless ROM.  Cardiovascular: Normal rate, regular rhythm. Grossly normal heart sounds.  Good peripheral circulation. Respiratory: Normal  respiratory effort.  No retractions. Lungs CTAB. Gastrointestinal: Soft , limited 2./2 obesity but no guarding or rebound noted.. No distention. No abdominal bruits. No CVA tenderness. Musculoskeletal: No lower extremity tenderness nor edema.  No joint effusions. Neurologic:  Normal speech and language. No gross focal neurologic deficits are appreciated. No facial droop Skin:  Skin is warm, dry and intact. No rash noted. Psychiatric: Mood and affect are normal. Speech and behavior are normal.  ____________________________________________   LABS (all labs ordered are listed, but only abnormal results are displayed)  Results for orders placed or performed during the hospital encounter of 03/12/17 (from the past 24 hour(s))  Lipase, blood     Status: None   Collection Time: 03/12/17  2:18 PM  Result Value Ref Range   Lipase 18 11 - 51 U/L  Comprehensive metabolic panel     Status: Abnormal   Collection Time: 03/12/17  2:18 PM  Result Value Ref Range   Sodium 143 135 - 145 mmol/L   Potassium 3.8 3.5 - 5.1 mmol/L   Chloride 110 101 - 111 mmol/L   CO2 25 22 - 32 mmol/L   Glucose, Bld 113 (H) 65 - 99 mg/dL   BUN 12 6 - 20 mg/dL   Creatinine, Ser 4.091.01 (H) 0.44 - 1.00 mg/dL   Calcium 8.9 8.9 - 81.110.3 mg/dL   Total Protein 7.6 6.5 - 8.1 g/dL   Albumin 3.7 3.5 - 5.0 g/dL   AST 15 15 - 41 U/L   ALT 14 14 - 54 U/L   Alkaline Phosphatase 80 38 - 126 U/L   Total Bilirubin 0.5 0.3 - 1.2 mg/dL   GFR calc non Af Amer >60 >60 mL/min   GFR calc Af Amer >60 >60 mL/min   Anion gap 8 5 - 15  CBC     Status: Abnormal   Collection Time: 03/12/17  2:18 PM  Result Value Ref Range   WBC 8.1 3.6 - 11.0 K/uL   RBC 3.71 (L) 3.80 - 5.20 MIL/uL   Hemoglobin 8.8 (L) 12.0 - 16.0 g/dL   HCT 91.427.9 (L) 78.235.0 - 95.647.0 %   MCV 75.3 (L) 80.0 - 100.0 fL   MCH 23.7 (L) 26.0 - 34.0 pg   MCHC 31.4 (L) 32.0 - 36.0 g/dL   RDW 21.318.0 (H) 08.611.5 - 57.814.5 %   Platelets 527 (H) 150 - 440 K/uL  Urinalysis, Complete w Microscopic      Status: Abnormal   Collection Time: 03/12/17  2:18 PM  Result Value Ref Range   Color, Urine YELLOW (A) YELLOW   APPearance CLEAR (A) CLEAR   Specific Gravity, Urine 1.021 1.005 - 1.030   pH 5.0 5.0 - 8.0   Glucose, UA NEGATIVE NEGATIVE mg/dL   Hgb urine dipstick NEGATIVE NEGATIVE   Bilirubin Urine NEGATIVE NEGATIVE   Ketones, ur NEGATIVE NEGATIVE mg/dL   Protein, ur NEGATIVE NEGATIVE mg/dL   Nitrite NEGATIVE NEGATIVE   Leukocytes, UA NEGATIVE NEGATIVE   RBC / HPF 0-5 0 - 5 RBC/hpf   WBC, UA 0-5 0 -  5 WBC/hpf   Bacteria, UA NONE SEEN NONE SEEN   Squamous Epithelial / LPF 0-5 (A) NONE SEEN   Mucous PRESENT   Pregnancy, urine POC     Status: None   Collection Time: 03/12/17  4:58 PM  Result Value Ref Range   Preg Test, Ur NEGATIVE NEGATIVE   ____________________________________________  EKG My review and personal interpretation at Time: 14:16   Indication: llq pain  Rate: 80  Rhythm: sinus Axis: normal Other: normal intervals, non specific st changes, no stemi ____________________________________________  RADIOLOGY  I personally reviewed all radiographic images ordered to evaluate for the above acute complaints and reviewed radiology reports and findings.  These findings were personally discussed with the patient.  Please see medical record for radiology report.  ____________________________________________   PROCEDURES  Procedure(s) performed:  Procedures    Critical Care performed: no ____________________________________________   INITIAL IMPRESSION / ASSESSMENT AND PLAN / ED COURSE  Pertinent labs & imaging results that were available during my care of the patient were reviewed by me and considered in my medical decision making (see chart for details).  DDX: diverticulitis, constipation, gastritis, msk strain, stone, pyelo  Kristina Cruz is a 56 y.o. who presents to the ED with left lower quadrant abdominal pain is described above. Patient afebrile in  no acute distress. Bowel exam is limited by obesity but based on her tenderness will order CT imaging to evaluate for any evidence of acute inflammatory process. Patient does have evidence of anemia as compared with her most recent baseline of back in 2016.  Does appear to be consistent with iron deficiency anemia. Patient denies any melena or blood in her stools. Do not feel this is consistent with acute GI bleed at this time as she is normotensive and denies any signs or symptoms of acute blood loss anemia.  The patient will be placed on continuous pulse oximetry and telemetry for monitoring.  Laboratory evaluation will be sent to evaluate for the above complaints.     Clinical Course as of Mar 12 1918  Thu Mar 12, 2017  1633 Reviewed CT imaging results with patient. Will order ultrasound to further characterize endometrial thickening.   [PR]  1857 Extensive conversation regarding patient's ultrasound. Repeat abdominal exam remains unchanged. She is in no acute distress. This will do feel the patient be stable and appropriate for outpatient follow-up with her primary care physician has been given referral to OB/GYN. Discussed signs and symptoms for which she should return immediately to the hospital.  Have discussed with the patient and available family all diagnostics and treatments performed thus far and all questions were answered to the best of my ability. The patient demonstrates understanding and agreement with plan.   [PR]    Clinical Course User Index [PR] Willy Eddyobinson, Faizon Capozzi, MD     ____________________________________________   FINAL CLINICAL IMPRESSION(S) / ED DIAGNOSES  Final diagnoses:  Abdominal pain, unspecified abdominal location  Thickened endometrium      NEW MEDICATIONS STARTED DURING THIS VISIT:  New Prescriptions   FERROUS GLUCONATE (IRON 27) 240 (27 FE) MG TABLET    Take 1 tablet (240 mg total) by mouth 3 (three) times daily with meals. Take every other day    TRAMADOL (ULTRAM) 50 MG TABLET    Take 1 tablet (50 mg total) by mouth every 6 (six) hours as needed.     Note:  This document was prepared using Dragon voice recognition software and may include unintentional dictation errors.  Willy Eddy, MD 03/12/17 1901    Willy Eddy, MD 03/12/17 Jerene Bears

## 2017-03-12 NOTE — Discharge Instructions (Signed)

## 2017-04-01 ENCOUNTER — Ambulatory Visit: Payer: Self-pay | Admitting: Obstetrics and Gynecology

## 2017-04-06 ENCOUNTER — Ambulatory Visit: Payer: Self-pay | Admitting: Obstetrics & Gynecology

## 2017-04-13 ENCOUNTER — Ambulatory Visit: Payer: Self-pay | Admitting: Obstetrics & Gynecology

## 2017-04-27 ENCOUNTER — Ambulatory Visit (INDEPENDENT_AMBULATORY_CARE_PROVIDER_SITE_OTHER): Payer: Medicare Other | Admitting: Obstetrics & Gynecology

## 2017-04-27 ENCOUNTER — Encounter: Payer: Self-pay | Admitting: Obstetrics & Gynecology

## 2017-04-27 VITALS — BP 130/90 | HR 72 | Ht 61.0 in | Wt 360.0 lb

## 2017-04-27 DIAGNOSIS — R938 Abnormal findings on diagnostic imaging of other specified body structures: Secondary | ICD-10-CM | POA: Diagnosis not present

## 2017-04-27 DIAGNOSIS — N926 Irregular menstruation, unspecified: Secondary | ICD-10-CM | POA: Insufficient documentation

## 2017-04-27 DIAGNOSIS — R9389 Abnormal findings on diagnostic imaging of other specified body structures: Secondary | ICD-10-CM | POA: Insufficient documentation

## 2017-04-27 NOTE — Patient Instructions (Signed)
Sonohysterogram A sonohysterogram is a procedure to examine the inside of the uterus. This exam uses sound waves that are sent to a computer to make images of the lining of the uterus (endometrium). To get the best images, a germ-free, salt-water solution (sterile saline) is put into the uterus through the vagina. You may have this procedure if you have certain reproductive problems, such as abnormal bleeding, infertility, or miscarriage. This procedure can show what may be causing these problems. Possible causes include scarring or abnormal growths such as fibroids inside your uterus. It can also show if your uterus is an abnormal shape or if the lining of the uterus is too thin. Tell a health care provider about:  All medicines you are taking, including vitamins, herbs, eye drops, creams, and over-the-counter medicines.  Any allergies you have.  Any blood disorders you have.  Any surgeries you have had.  Any medical conditions you have.  Whether you are pregnant or may be pregnant.  The date of the first day of your last period.  Any signs of infection, such as fever, pain in your lower abdomen, or abnormal discharge from your vagina. What are the risks? Generally, this is a safe procedure. However, problems may occur, including:  Abdominal pain or cramping.  Light bleeding (spotting).  Increased vaginal discharge.  Infection. What happens before the procedure?  Your health care provider may have you take an over-the-counter pain medicine.  You may be given medicine to stop any abnormal bleeding.  You may be given antibiotic medicine to help prevent infection.  You may be asked to take a pregnancy test. This is usually in the form of a urine test.  You may have a pelvic exam.  You will be asked to empty your bladder. What happens during the procedure?  You will lie down on the exam table with your feet in stirrups or with your knees bent and your feet flat on the  table.  A slender, handheld device (transducer) will be lubricated and placed into your vagina.  The transducer will be positioned to send sound waves to your uterus. The sound waves are sent to a computer and are turned into images, which your health care provider sees during the procedure.  The transducer will be removed from your vagina.  An instrument will be inserted to widen the opening of your vagina (speculum).  A swab with germ-killing solution (antiseptic) will be used to clean the opening to your uterus (cervix).  A long, thin tube (catheter) will be placed through your cervix into your uterus.  The speculum will be removed.  The transducer will be placed back into your vagina to take more images.  Your uterus will be filled with a germ-free, salt-water solution (sterile saline) through the catheter. You may feel some cramping.  A fluid that contains air bubbles may be sent through the catheter to make it easier to see the fallopian tubes.  The transducer and catheter will be removed. The procedure may vary among health care providers and hospitals. What happens after the procedure?  It is up to you to get the results of your procedure. Ask your health care provider, or the department that is doing the procedure, when your results will be ready. Summary  A sonohysterogram is a procedure that creates images of the inside of the uterus.  The risks of this procedure are very low. Most women experience cramping and spotting after the procedure.  You may need to have a   pelvic exam and take a pregnancy test before this procedure. This procedure will not be done if you are pregnant or have an infection. This information is not intended to replace advice given to you by your health care provider. Make sure you discuss any questions you have with your health care provider. Document Released: 12/19/2013 Document Revised: 06/30/2016 Document Reviewed: 06/30/2016 Elsevier  Interactive Patient Education  2017 Elsevier Inc.  

## 2017-04-27 NOTE — Progress Notes (Signed)
Postmenopausal Bleeding Patient complains of vaginal bleeding. She has been menopausal for a few years based on irreg spaced bleeding since [facility] 2015, and has FH late menopause; no other sx's;  Bleeding is described as heavy from May-July, then stopped.  Other menopausal symptoms include: none. Workup to date: pelvic ultrasound.  Pt had D&C for polyps in 2015 (Dr Jean Rosenthal).  Since that time has had intermittant 2-3 bleeding episodes per year, not bothersome.  Then this year had prolonged episode of bleeding.  [place] and CT in ER in July showed endometrial thickening at 32mm.  No other abnormalities.  Review of ULTRASOUND.  I have personally reviewed images and report of recent ultrasound done at [facility].  Menstrual History: OB History    Gravida Para Term Preterm AB Living   0 1 0   SAB TAB Ectopic Multiple Live Births   1 0 0 0 0      PMHx: She  has a past medical history of Anemia; Back pain; and Hypertension. Also,  has a past surgical history that includes Polypectomy (2015)., family history includes Breast cancer (age of onset: 48) in her mother; Ovarian cancer in her paternal aunt.,  reports that she has never smoked. She has never used smokeless tobacco. She reports that she does not drink alcohol or use drugs.  She has a current medication list which includes the following prescription(s): albuterol, ferrous gluconate, furosemide, hydrochlorothiazide, methocarbamol, methylprednisolone, oxycodone-acetaminophen, oxycodone-acetaminophen, prednisone, ranitidine, ropinirole, and tramadol. Also, is allergic to contrast media [iodinated diagnostic agents] and other.  Review of Systems  Constitutional: Negative for chills, fever and malaise/fatigue.  HENT: Negative for congestion, sinus pain and sore throat.   Eyes: Negative for blurred vision and pain.  Respiratory: Negative for cough and wheezing.   Cardiovascular: Negative for chest pain and leg swelling.  Gastrointestinal: Negative  for abdominal pain, constipation, diarrhea, heartburn, nausea and vomiting.  Genitourinary: Negative for dysuria, frequency, hematuria and urgency.  Musculoskeletal: Negative for back pain, joint pain, myalgias and neck pain.  Skin: Negative for itching and rash.  Neurological: Negative for dizziness, tremors and weakness.  Endo/Heme/Allergies: Does not bruise/bleed easily.  Psychiatric/Behavioral: Negative for depression. The patient is not nervous/anxious and does not have insomnia.    Objective: BP 130/90   Pulse 72   Ht  (1.549 m)   Wt (!) 360 lb (163.3 kg)   LMP 03/01/2017   BMI 68.02 kg/m  Physical Exam  Constitutional: She is oriented to person, place, and time. She appears well-developed and well-nourished. No distress.  Musculoskeletal: Normal range of motion.  Neurological: She is alert and oriented to person, place, and time.  Skin: Skin is warm and dry.  Psychiatric: She has a normal mood and affect.  Vitals reviewed.   ASSESSMENT/PLAN:    Problem List Items Addressed This Visit      Other   Endometrial thickening on ultrasound - Primary   Relevant Orders   [place] Sonohysterogram   Irregular menstrual bleeding    Based on history, may have return of polyps.  Must also eval for endometrial cancer. Will have SHG w EMB scheduled in near future. D&C option also discussed.  Obesity risk factor observed and discussed.  If neg EMB, then OK to wait and not have surgery unless abn bleeding returns again to the point of bother to patient.  A total of 20 minutes were spent face-to-face with the patient during this encounter and over half of that time dealt with counseling  and coordination of care.  Annamarie MajorPaul Altamese Deguire, MD, Merlinda FrederickFACOG [facility], [facility] 04/27/2017  10:22 AM

## 2017-05-08 ENCOUNTER — Ambulatory Visit: Payer: Medicare Other | Admitting: Obstetrics & Gynecology

## 2017-05-08 ENCOUNTER — Other Ambulatory Visit: Payer: Medicare Other

## 2017-05-19 ENCOUNTER — Other Ambulatory Visit: Payer: Self-pay | Admitting: Obstetrics & Gynecology

## 2017-05-19 DIAGNOSIS — R9389 Abnormal findings on diagnostic imaging of other specified body structures: Secondary | ICD-10-CM

## 2017-05-20 ENCOUNTER — Other Ambulatory Visit: Payer: Medicare Other

## 2017-05-20 ENCOUNTER — Ambulatory Visit: Payer: Medicare Other | Admitting: Obstetrics & Gynecology

## 2017-05-29 ENCOUNTER — Telehealth: Payer: Self-pay | Admitting: Obstetrics & Gynecology

## 2017-05-29 DIAGNOSIS — N950 Postmenopausal bleeding: Secondary | ICD-10-CM

## 2017-05-29 NOTE — Telephone Encounter (Signed)
Pt is calling to reschedule her SHGM.Please call patient.

## 2017-06-02 NOTE — Telephone Encounter (Signed)
Can you schedule

## 2017-06-02 NOTE — Telephone Encounter (Signed)
Order placed

## 2017-06-02 NOTE — Telephone Encounter (Signed)
Pt is calling to reschedule SHGM due to having the flu. I attempted to schedule patient for first available 07/01/17 with [facility] and the order runs out. Please advise and submit new SHGM Order

## 2017-06-03 NOTE — Telephone Encounter (Signed)
Pt is schedule 07/01/17 °

## 2017-06-22 ENCOUNTER — Ambulatory Visit (INDEPENDENT_AMBULATORY_CARE_PROVIDER_SITE_OTHER): Payer: Medicare Other

## 2017-06-22 ENCOUNTER — Encounter: Payer: Self-pay | Admitting: Obstetrics & Gynecology

## 2017-06-22 ENCOUNTER — Ambulatory Visit (INDEPENDENT_AMBULATORY_CARE_PROVIDER_SITE_OTHER): Payer: Medicare Other | Admitting: Obstetrics & Gynecology

## 2017-06-22 DIAGNOSIS — R9389 Abnormal findings on diagnostic imaging of other specified body structures: Secondary | ICD-10-CM

## 2017-06-22 DIAGNOSIS — N950 Postmenopausal bleeding: Secondary | ICD-10-CM

## 2017-06-22 NOTE — Patient Instructions (Signed)
Hysteroscopy  Hysteroscopy is a procedure used for looking inside the womb (uterus). It may be done for various reasons, including:  · To evaluate abnormal bleeding, fibroid (benign, noncancerous) tumors, polyps, scar tissue (adhesions), and possibly cancer of the uterus.  · To look for lumps (tumors) and other uterine growths.  · To look for causes of why a woman cannot get pregnant (infertility), causes of recurrent loss of pregnancy (miscarriages), or a lost intrauterine device (IUD).  · To perform a sterilization by blocking the fallopian tubes from inside the uterus.    In this procedure, a thin, flexible tube with a tiny light and camera on the end of it (hysteroscope) is used to look inside the uterus. A hysteroscopy should be done right after a menstrual period to be sure you are not pregnant.  LET YOUR HEALTH CARE PROVIDER KNOW ABOUT:  · Any allergies you have.  · All medicines you are taking, including vitamins, herbs, eye drops, creams, and over-the-counter medicines.  · Previous problems you or members of your family have had with the use of anesthetics.  · Any blood disorders you have.  · Previous surgeries you have had.  · Medical conditions you have.  RISKS AND COMPLICATIONS  Generally, this is a safe procedure. However, as with any procedure, complications can occur. Possible complications include:  · Putting a hole in the uterus.  · Excessive bleeding.  · Infection.  · Damage to the cervix.  · Injury to other organs.  · Allergic reaction to medicines.  · Too much fluid used in the uterus for the procedure.    BEFORE THE PROCEDURE  · Ask your health care provider about changing or stopping any regular medicines.  · Do not take aspirin or blood thinners for 1 week before the procedure, or as directed by your health care provider. These can cause bleeding.  · If you smoke, do not smoke for 2 weeks before the procedure.  · In some cases, a medicine is placed in the cervix the day before the procedure.  This medicine makes the cervix have a larger opening (dilate). This makes it easier for the instrument to be inserted into the uterus during the procedure.  · Do not eat or drink anything for at least 8 hours before the surgery.  · Arrange for someone to take you home after the procedure.  PROCEDURE  · You may be given a medicine to relax you (sedative). You may also be given one of the following:  ? A medicine that numbs the area around the cervix (local anesthetic).  ? A medicine that makes you sleep through the procedure (general anesthetic).  · The hysteroscope is inserted through the vagina into the uterus. The camera on the hysteroscope sends a picture to a TV screen. This gives the surgeon a good view inside the uterus.  · During the procedure, air or a liquid is put into the uterus, which allows the surgeon to see better.  · Sometimes, tissue is gently scraped from inside the uterus. These tissue samples are sent to a lab for testing.  What to expect after the procedure  · If you had a general anesthetic, you may be groggy for a couple hours after the procedure.  · If you had a local anesthetic, you will be able to go home as soon as you are stable and feel ready.  · You may have some cramping. This normally lasts for a couple days.  · You may   have bleeding, which varies from light spotting for a few days to menstrual-like bleeding for 3-7 days. This is normal.  · If your test results are not back during the visit, make an appointment with your health care provider to find out the results.  This information is not intended to replace advice given to you by your health care provider. Make sure you discuss any questions you have with your health care provider.  Document Released: 11/10/2000 Document Revised: 01/10/2016 Document Reviewed: 03/03/2013  Elsevier Interactive Patient Education © 2017 Elsevier Inc.

## 2017-06-22 NOTE — Progress Notes (Signed)
Sonohysterogram Procedure Note  The indications for this procedure were reviewed with the patient. The procedure was explained in detail and all questions were answered.  The patient was placed in the lithotomy position. A graves speculum was introduced into the vagina and the cervix was visualized. The cervix was prepped with iodine solution. A Cook's Hysterography catheter was then introduced into the uterine cavity and the speculum was removed.   Sterile sonohysterography with 3D Reconstruction was performed. The endometrial cavity was poorly distended with sterile saline, with possible incomplete placement of cathater (difficult exam due to obesity). The findings are as follows: thickened lining by US, [facility].  The patient tolerated the procedure well without complication, and was discharged to home.   Endometrial Biopsy After discussion with the patient regarding her abnormal uterine bleeding I recommended that she proceed with an endometrial biopsy for further diagnosis. The risks, benefits, alternatives, and indications for an endometrial biopsy were discussed with the patient in detail. She understood the risks including infection, bleeding, cervical laceration and uterine perforation.  Verbal consent was obtained.   PROCEDURE NOTE:  Pipelle endometrial biopsy was performed using aseptic technique with iodine preparation.  The uterus was sounded to a length of 6 cm.  Adequate sampling was obtained with minimal blood loss.  The patient tolerated the procedure well.  Disposition will be pending pathology.  Discussed options for D&C Hyst to more completely see, biopsy, and even remove thickening from the lining of the uterus.  Anesthesia and surgery risks explained.  Hysterectomy discussed per pt request but higher risk than reward at this time.  Annamarie MajorPaul Harris, MD, Merlinda FrederickFACOG [facility], [facility] 06/22/2017  5:15 PM

## 2017-06-23 ENCOUNTER — Telehealth: Payer: Self-pay | Admitting: Obstetrics & Gynecology

## 2017-06-23 NOTE — Telephone Encounter (Signed)
Patient is aware of H&P on 07/06/17 @ 9:20am, Pre-admit Testing afterwards, and OR on 07/14/17. Patient is aware Dr. Tiburcio PeaHarris requests medical clearance, per patient [facility], said she has not been seen recently but is due for an exam, and will call them today for an appointment. Patient will call back to let me know when the appt is scheduled and I will fax the form. Ext given.

## 2017-06-23 NOTE — Telephone Encounter (Signed)
-----   Message from Nadara Mustardobert P Harris, MD sent at 06/22/2017  5:14 PM EST ----- Regarding: surg Surgery Booking Request Patient Full Name:   MRN: [id number]  DOB: 1960-12-24  Surgeon: [name], MD  Requested Surgery Date and Time: 11.27.18 Primary Diagnosis AND Code: Postmenopausal Bleeding N95.0 Secondary Diagnosis and Code:  Surgical Procedure: HYSTEROSCOPY D&C L&D Notification: No Admission Status: same day surgery Length of Surgery: 30 min Special Case Needs: no H&P: yes (date) Phone Interview???: no Interpreter: Language:  Medical Clearance: yes Special Scheduling Instructions: no

## 2017-06-25 LAB — PATHOLOGY

## 2017-07-01 ENCOUNTER — Ambulatory Visit: Payer: Medicare Other | Admitting: Obstetrics & Gynecology

## 2017-07-01 ENCOUNTER — Other Ambulatory Visit: Payer: Medicare Other

## 2017-07-06 ENCOUNTER — Other Ambulatory Visit: Payer: Self-pay

## 2017-07-06 ENCOUNTER — Ambulatory Visit (INDEPENDENT_AMBULATORY_CARE_PROVIDER_SITE_OTHER): Payer: Medicare Other | Admitting: Obstetrics & Gynecology

## 2017-07-06 ENCOUNTER — Encounter
Admission: RE | Admit: 2017-07-06 | Discharge: 2017-07-06 | Disposition: A | Discharge status: Home / Self Care | Payer: Medicare Other | Source: Ambulatory Visit | Attending: Obstetrics & Gynecology | Admitting: Obstetrics & Gynecology

## 2017-07-06 ENCOUNTER — Encounter: Payer: Self-pay | Admitting: Obstetrics & Gynecology

## 2017-07-06 VITALS — BP 130/80 | HR 71 | Ht 63.0 in | Wt 340.0 lb

## 2017-07-06 DIAGNOSIS — N8501 Benign endometrial hyperplasia: Secondary | ICD-10-CM

## 2017-07-06 DIAGNOSIS — Z7984 Long term (current) use of oral hypoglycemic drugs: Secondary | ICD-10-CM | POA: Diagnosis not present

## 2017-07-06 DIAGNOSIS — R9389 Abnormal findings on diagnostic imaging of other specified body structures: Secondary | ICD-10-CM

## 2017-07-06 DIAGNOSIS — Z01818 Encounter for other preprocedural examination: Secondary | ICD-10-CM | POA: Diagnosis present

## 2017-07-06 DIAGNOSIS — Z7952 Long term (current) use of systemic steroids: Secondary | ICD-10-CM | POA: Insufficient documentation

## 2017-07-06 DIAGNOSIS — N926 Irregular menstruation, unspecified: Secondary | ICD-10-CM | POA: Insufficient documentation

## 2017-07-06 DIAGNOSIS — Z79899 Other long term (current) drug therapy: Secondary | ICD-10-CM | POA: Insufficient documentation

## 2017-07-06 HISTORY — DX: Restless legs syndrome: G25.81

## 2017-07-06 HISTORY — DX: Type 2 diabetes mellitus without complications: E11.9

## 2017-07-06 LAB — APTT: APTT: 34 s (ref 24–36)

## 2017-07-06 LAB — CBC
HEMATOCRIT: 35.7 % (ref 35.0–47.0)
Hemoglobin: 11.7 g/dL — ABNORMAL LOW (ref 12.0–16.0)
MCH: 25.9 pg — ABNORMAL LOW (ref 26.0–34.0)
MCHC: 32.8 g/dL (ref 32.0–36.0)
MCV: 79.2 fL — AB (ref 80.0–100.0)
PLATELETS: 402 10*3/uL (ref 150–440)
RBC: 4.51 MIL/uL (ref 3.80–5.20)
RDW: 18.6 % — ABNORMAL HIGH (ref 11.5–14.5)
WBC: 6.6 10*3/uL (ref 3.6–11.0)

## 2017-07-06 LAB — PROTIME-INR
INR: 1.06
PROTHROMBIN TIME: 13.7 s (ref 11.4–15.2)

## 2017-07-06 LAB — TYPE AND SCREEN
ABO/RH(D): O POS
Antibody Screen: NEGATIVE

## 2017-07-06 LAB — BASIC METABOLIC PANEL
Anion gap: 7 (ref 5–15)
BUN: 20 mg/dL (ref 6–20)
CHLORIDE: 106 mmol/L (ref 101–111)
CO2: 27 mmol/L (ref 22–32)
CREATININE: 0.92 mg/dL (ref 0.44–1.00)
Calcium: 8.6 mg/dL — ABNORMAL LOW (ref 8.9–10.3)
GFR calc Af Amer: >60 mL/min (ref >60)
GFR calc non Af Amer: >60 mL/min (ref >60)
GLUCOSE: 109 mg/dL — AB (ref 65–99)
POTASSIUM: 3.6 mmol/L (ref 3.5–5.1)
Sodium: 140 mmol/L (ref 135–145)

## 2017-07-06 NOTE — Progress Notes (Signed)
PRE-OPERATIVE HISTORY AND PHYSICAL EXAM  HPI:  Kristina Cruz is a 56 y.o. G2P1010 No LMP recorded.; she is being admitted for surgery related to abnormal uterine bleeding.  Patient complains of vaginal bleeding. She has been menopausal for a few years based [facility] irreg [facility], and has FH late menopause; no other sx's;  Bleeding is described as heavy from May-July, then stopped.  Other menopausal symptoms include: none. Workup to date: pelvic ultrasound.  Pt had D&C for polyps in 2015 (Dr Jean RosenthalJackson).  Since that time has had intermittant 2-3 bleeding episodes per year, not bothersome.  Then this year had prolonged episode of bleeding.  US and CT in ER in July showed endometrial thickening at 32mm.  No other abnormalities.  Review of ULTRASOUND.  I have personally reviewed images and [facility]   EMB showed Simple Enfometrial Hyperplasia without Atypia.  PMHx: Past Medical History:  Diagnosis Date  . Anemia   . Back pain   . Hypertension    Past Surgical History:  Procedure Laterality Date  . POLYPECTOMY  2015   Family History  Problem Relation Age of Onset  . Breast cancer Mother [id number]  . Ovarian cancer Paternal Aunt        ?   Social History   Tobacco Use  . Smoking status: Never Smoker  . Smokeless tobacco: Never Used  Substance Use Topics  . Alcohol use: No  . Drug use: No    Current Outpatient Medications:  .  losartan (COZAAR) 100 MG tablet, , Disp: , Rfl:  .  albuterol (PROVENTIL HFA;VENTOLIN HFA) 108 (90 BASE) MCG/ACT inhaler, Inhale 2 puffs into the lungs every 4 (four) hours as needed for wheezing., Disp: , Rfl:  .  ferrous gluconate (IRON 27) 240 (27 FE) MG tablet, Take 1 tablet (240 mg total) by mouth 3 (three) times daily with meals. Take every other day, Disp: 90 tablet, Rfl: 0 .  furosemide (LASIX) 20 MG tablet, Take 1 tablet (20 mg total) by mouth daily. (Patient not taking: Reported on 07/06/2017),  Disp: 2 tablet, Rfl: 0 .  hydrochlorothiazide (HYDRODIURIL) 25 MG tablet, Take 25 mg by mouth at bedtime., Disp: , Rfl:  .  metFORMIN (GLUCOPHAGE) 500 MG tablet, , Disp: , Rfl:  .  methocarbamol (ROBAXIN-750) 750 MG tablet, Take 1 tablet (750 mg total) by mouth 4 (four) times daily. (Patient not taking: Reported on 07/06/2017), Disp: 20 tablet, Rfl: 0 .  methylPREDNISolone (MEDROL DOSEPAK) 4 MG TBPK tablet, Take Tapered dose as directed (Patient not taking: Reported on 07/06/2017), Disp: 21 tablet, Rfl: 0 .  oxyCODONE-acetaminophen (PERCOCET/ROXICET) 5-325 MG tablet, Take 1 tablet by mouth every 4 (four) hours as needed for severe pain. (Patient not taking: Reported on 07/06/2017), Disp: 20 tablet, Rfl: 0 .  predniSONE (DELTASONE) 20 MG tablet, Take 3 tablets (60 mg total) by mouth daily with breakfast. (Patient not taking: Reported on 07/06/2017), Disp: 12 tablet, Rfl: 0 .  ranitidine (ZANTAC) 150 MG tablet, Take 150 mg by mouth every morning., Disp: , Rfl:  .  rOPINIRole (REQUIP) 0.25 MG tablet, Take 1 mg by mouth at bedtime., Disp: , Rfl:  .  traMADol (ULTRAM) 50 MG tablet, Take 1 tablet (50 mg total) by mouth every 6 (six) hours as needed. (Patient not taking: Reported on 07/06/2017), Disp: 20 tablet, Rfl: 0 Allergies: Contrast media [iodinated diagnostic agents] and Other  Review of Systems  Constitutional: Negative for  chills, fever and malaise/fatigue.  HENT: Negative for congestion, sinus pain and sore throat.   Eyes: Negative for blurred vision and pain.  Respiratory: Negative for cough and wheezing.   Cardiovascular: Negative for chest pain and leg swelling.  Gastrointestinal: Negative for abdominal pain, constipation, diarrhea, heartburn, nausea and vomiting.  Genitourinary: Negative for dysuria, frequency, hematuria and urgency.  Musculoskeletal: Negative for back pain, joint pain, myalgias and neck pain.  Skin: Negative for itching and rash.  Neurological: Negative for dizziness,  tremors and weakness.  Endo/Heme/Allergies: Does not bruise/bleed easily.  Psychiatric/Behavioral: Negative for depression. The patient is not nervous/anxious and does not have insomnia.    Objective: BP 130/80   Pulse 71   Ht 5\' 3"  (1.6 m)   Wt (!) 340 lb (154.2 kg)   BMI 60.23 kg/m   Filed Weights   07/06/17 0911  Weight: (!) 340 lb (154.2 kg)   Physical Exam  Constitutional: She is oriented to person, place, and time. She appears well-developed and well-nourished. No distress.  Genitourinary: Rectum normal, vagina normal and uterus normal. Pelvic exam was performed with patient supine. There is no rash or lesion on the right labia. There is no rash or lesion on the left labia. Vagina exhibits no lesion. No bleeding in the vagina. Right adnexum does not display mass and does not display tenderness. Left adnexum does not display mass and does not display tenderness. Cervix does not exhibit motion tenderness, lesion, friability or polyp.   Uterus is mobile and midaxial. Uterus is not enlarged or exhibiting a mass.  HENT:  Head: Normocephalic and atraumatic. Head is without laceration.  Right Ear: Hearing normal.  Left Ear: Hearing normal.  Nose: No epistaxis.  No foreign bodies.  Mouth/Throat: Uvula is midline, oropharynx is clear and moist and mucous membranes are normal.  Eyes: Pupils are equal, round, and reactive to light.  Neck: Normal range of motion. Neck supple. No thyromegaly present.  Cardiovascular: Normal rate and regular rhythm. Exam reveals no gallop and no friction rub.  No murmur heard. Pulmonary/Chest: Effort normal and breath sounds normal. No respiratory distress. She has no wheezes. Right breast exhibits no mass, no skin change and no tenderness. Left breast exhibits no mass, no skin change and no tenderness.  Abdominal: Soft. Bowel sounds are normal. She exhibits no distension. There is no tenderness. There is no rebound.  Musculoskeletal: Normal range of motion.    Neurological: She is alert and oriented to person, place, and time. No cranial nerve deficit.  Skin: Skin is warm and dry.  Psychiatric: She has a normal mood and affect. Judgment normal.  Vitals reviewed.  Assessment: 1. Endometrial thickening on ultrasound   2. Irregular menstrual bleeding   3. Morbid obesity (HCC)   4. Endometrial hyperplasia without atypia, simple   Plan is for hysteroscopy D&C; all options discussed.  I have had a careful discussion with this patient about all the options available and the risk/benefits of each. I have fully informed this patient that surgery may subject her to a variety of discomforts and risks: She understands that most patients have surgery with little difficulty, but problems can happen ranging from minor to fatal. These include nausea, vomiting, pain, bleeding, infection, poor healing, hernia, or formation of adhesions. Unexpected reactions may occur from any drug or anesthetic given. Unintended injury may occur to other pelvic or abdominal structures such as Fallopian tubes, ovaries, bladder, ureter (tube from kidney to bladder), or bowel. Nerves going from the pelvis to  the legs may be injured. Any such injury may require immediate or later additional surgery to correct the problem. Excessive blood loss requiring transfusion is very unlikely but possible. Dangerous blood clots may form in the legs or lungs. Physical and sexual activity will be restricted in varying degrees for an indeterminate period of time but most often 2-6 weeks.  Finally, she understands that it is impossible to list every possible undesirable effect and that the condition for which surgery is done is not always cured or significantly improved, and in rare cases may be even worse.Ample time was given to answer all questions.  Annamarie MajorPaul Thadd Apuzzo, MD, Merlinda FrederickFACOG [facility], [facility] 07/06/2017  9:15 AM

## 2017-07-06 NOTE — Patient Instructions (Signed)
Hysteroscopy, Care After  Refer to this sheet in the next few weeks. These instructions provide you with information on caring for yourself after your procedure. Your health care provider may also give you more specific instructions. Your treatment has been planned according to current medical practices, but problems sometimes occur. Call your health care provider if you have any problems or questions after your procedure.  What can I expect after the procedure?  After your procedure, it is typical to have the following:  · You may have some cramping. This normally lasts for a couple days.  · You may have bleeding. This can vary from light spotting for a few days to menstrual-like bleeding for 3-7 days.    Follow these instructions at home:  · Rest for the first 1-2 days after the procedure.  · Only take over-the-counter or prescription medicines as directed by your health care provider. Do not take aspirin. It can increase the chances of bleeding.  · Take showers instead of baths for 2 weeks or as directed by your health care provider.  · Do not drive for 24 hours or as directed.  · Do not drink alcohol while taking pain medicine.  · Do not use tampons, douche, or have sexual intercourse for 2 weeks or until your health care provider says it is okay.  · Take your temperature twice a day for 4-5 days. Write it down each time.  · Follow your health care provider's advice about diet, exercise, and lifting.  · If you develop constipation, you may:  ? Take a mild laxative if your health care provider approves.  ? Add bran foods to your diet.  ? Drink enough fluids to keep your urine clear or pale yellow.  · Try to have someone with you or available to you for the first 24-48 hours, especially if you were given a general anesthetic.  · Follow up with your health care provider as directed.  Contact a health care provider if:  · You feel dizzy or lightheaded.  · You feel sick to your stomach (nauseous).  · You have  abnormal vaginal discharge.  · You have a rash.  · You have pain that is not controlled with medicine.  Get help right away if:  · You have bleeding that is heavier than a normal menstrual period.  · You have a fever.  · You have increasing cramps or pain, not controlled with medicine.  · You have new belly (abdominal) pain.  · You pass out.  · You have pain in the tops of your shoulders (shoulder strap areas).  · You have shortness of breath.  This information is not intended to replace advice given to you by your health care provider. Make sure you discuss any questions you have with your health care provider.  Document Released: 05/25/2013 Document Revised: 01/10/2016 Document Reviewed: 03/03/2013  Elsevier Interactive Patient Education © 2017 [employer]

## 2017-07-06 NOTE — Patient Instructions (Addendum)
Your procedure is scheduled on: 07/14/17 Tues Report to Same Day Surgery 2nd floor medical mall [facility](Medical Mall Entrance-take elevator on left to 2nd floor.  Check in with surgery information desk.) To find out your arrival time please call [phone] between 1PM - 3PM on 07/13/17 Mon  Remember: Instructions that are not followed completely may result in serious medical risk, up to and including death, or upon the discretion of your surgeon and anesthesiologist your surgery may need to be rescheduled.    _x___ 1. Do not eat food after midnight the night before your procedure. You may drink clear liquids up to 2 hours before you are scheduled to arrive at the hospital for your procedure.  Do not drink clear liquids within 2 hours of your scheduled arrival to the hospital.  Clear liquids include  --Water or Apple juice without pulp  --Clear carbohydrate beverage such as ClearFast or Gatorade  --Black Coffee or Clear Tea (No milk, no creamers, do not add anything to                  the coffee or Tea Type 1 and type 2 diabetics should only drink water.  No gum chewing or hard candies.     __x__ 2. No Alcohol for 24 hours before or after surgery.   __x__3. No Smoking for 24 prior to surgery.   ____  4. Bring all medications with you on the day of surgery if instructed.    __x__ 5. Notify your doctor if there is any change in your medical condition     (cold, fever, infections).     Do not wear jewelry, make-up, hairpins, clips or nail polish.  Do not wear lotions, powders, or perfumes. You may wear deodorant.  Do not shave 48 hours prior to surgery. Men may shave face and neck.  Do not bring valuables to the hospital.    [facility] is not responsible for any belongings or valuables.               Contacts, dentures or bridgework may not be worn into surgery.  Leave your suitcase in the car. After surgery it may be brought to your room.  For patients admitted to the hospital,  discharge time is determined by your                       treatment team.   Patients discharged the day of surgery will not be allowed to drive home.  You will need someone to drive you home and stay with you the night of your procedure.    Please read over the following fact sheets that you were given:   [facility] Preparing for Surgery and or MRSA Information   _x___ Take anti-hypertensive listed below, cardiac, seizure, asthma,     anti-reflux and psychiatric medicines. These include:  1. None  2.  3.  4.  5.  6.  ____Fleets enema or Magnesium Citrate as directed.   _x___ Use CHG Soap or sage wipes as directed on instruction sheet   ____ Use inhalers on the day of surgery and bring to hospital day of surgery  __x__ Stop Metformin and Janumet 2 days prior to surgery.    ____ Take 1/2 of usual insulin dose the night before surgery and none on the morning     surgery.   _x___ Follow recommendations from Cardiologist, Pulmonologist or PCP regarding  stopping Aspirin, Coumadin, Plavix ,Eliquis, Effient, or Pradaxa, and Pletal.  X____Stop Anti-inflammatories such as Advil, Aleve, Ibuprofen, Motrin, Naproxen, Naprosyn, Goodies powders or aspirin products. OK to take Tylenol and                          Celebrex.   _x___ Stop supplements until after surgery.  But may continue Vitamin D, Vitamin B,       and multivitamin.   ____ Bring C-Pap to the hospital.

## 2017-07-14 ENCOUNTER — Ambulatory Visit: Payer: Medicare Other | Admitting: Anesthesiology

## 2017-07-14 ENCOUNTER — Ambulatory Visit
Admission: RE | Admit: 2017-07-14 | Discharge: 2017-07-14 | Disposition: A | Discharge status: Home / Self Care | Payer: Medicare Other | Source: Ambulatory Visit | Attending: Obstetrics & Gynecology | Admitting: Obstetrics & Gynecology

## 2017-07-14 ENCOUNTER — Encounter
Admission: RE | Disposition: A | Discharge status: Home / Self Care | Payer: Self-pay | Source: Ambulatory Visit | Attending: Obstetrics & Gynecology

## 2017-07-14 DIAGNOSIS — D649 Anemia, unspecified: Secondary | ICD-10-CM | POA: Insufficient documentation

## 2017-07-14 DIAGNOSIS — N950 Postmenopausal bleeding: Secondary | ICD-10-CM | POA: Insufficient documentation

## 2017-07-14 DIAGNOSIS — Z79899 Other long term (current) drug therapy: Secondary | ICD-10-CM | POA: Insufficient documentation

## 2017-07-14 DIAGNOSIS — Z803 Family history of malignant neoplasm of breast: Secondary | ICD-10-CM | POA: Insufficient documentation

## 2017-07-14 DIAGNOSIS — N8500 Endometrial hyperplasia, unspecified: Secondary | ICD-10-CM | POA: Diagnosis not present

## 2017-07-14 DIAGNOSIS — R9389 Abnormal findings on diagnostic imaging of other specified body structures: Secondary | ICD-10-CM | POA: Diagnosis present

## 2017-07-14 DIAGNOSIS — Z6841 Body Mass Index (BMI) 40.0 and over, adult: Secondary | ICD-10-CM | POA: Diagnosis not present

## 2017-07-14 DIAGNOSIS — E119 Type 2 diabetes mellitus without complications: Secondary | ICD-10-CM | POA: Diagnosis not present

## 2017-07-14 DIAGNOSIS — Z7984 Long term (current) use of oral hypoglycemic drugs: Secondary | ICD-10-CM | POA: Diagnosis not present

## 2017-07-14 DIAGNOSIS — Z91041 Radiographic dye allergy status: Secondary | ICD-10-CM | POA: Insufficient documentation

## 2017-07-14 HISTORY — PX: HYSTEROSCOPY WITH D & C: SHX1775

## 2017-07-14 LAB — GLUCOSE, CAPILLARY
GLUCOSE-CAPILLARY: 117 mg/dL — AB (ref 65–99)
GLUCOSE-CAPILLARY: 108 mg/dL — AB (ref 65–99)

## 2017-07-14 LAB — ABO/RH: ABO/RH(D): O POS

## 2017-07-14 SURGERY — DILATATION AND CURETTAGE /HYSTEROSCOPY
Anesthesia: General

## 2017-07-14 MED ORDER — ONDANSETRON HCL 4 MG/2ML IJ SOLN
INTRAMUSCULAR | Status: AC
  Filled 2017-07-14: qty 2

## 2017-07-14 MED ORDER — OXYCODONE-ACETAMINOPHEN 5-325 MG PO TABS
1.0000 | ORAL_TABLET | Freq: Once | ORAL | Status: AC
  Administered 2017-07-14: 1 via ORAL

## 2017-07-14 MED ORDER — PROPOFOL 10 MG/ML IV BOLUS
INTRAVENOUS | Status: AC
  Filled 2017-07-14: qty 20

## 2017-07-14 MED ORDER — ONDANSETRON HCL 4 MG/2ML IJ SOLN
INTRAMUSCULAR | Status: DC | PRN
  Administered 2017-07-14: 4 mg via INTRAVENOUS

## 2017-07-14 MED ORDER — OXYCODONE-ACETAMINOPHEN 5-325 MG PO TABS: 1.0000 | ORAL_TABLET | ORAL | 0 refills | Status: DC | PRN

## 2017-07-14 MED ORDER — MIDAZOLAM HCL 2 MG/2ML IJ SOLN
INTRAMUSCULAR | Status: AC
  Filled 2017-07-14: qty 4

## 2017-07-14 MED ORDER — SODIUM CHLORIDE 0.9 % IV SOLN
INTRAVENOUS | Status: DC
  Administered 2017-07-14: 14:00:00 via INTRAVENOUS

## 2017-07-14 MED ORDER — FAMOTIDINE 20 MG PO TABS
20.0000 mg | ORAL_TABLET | Freq: Once | ORAL | Status: AC
  Administered 2017-07-14: 20 mg via ORAL

## 2017-07-14 MED ORDER — LACTATED RINGERS IV SOLN: INTRAVENOUS | Status: DC

## 2017-07-14 MED ORDER — KETOROLAC TROMETHAMINE 30 MG/ML IJ SOLN
30.0000 mg | Freq: Four times a day (QID) | INTRAMUSCULAR | Status: DC
  Administered 2017-07-14: 30 mg via INTRAVENOUS

## 2017-07-14 MED ORDER — MIDAZOLAM HCL 2 MG/2ML IJ SOLN
INTRAMUSCULAR | Status: DC | PRN
  Administered 2017-07-14: 1 mg via INTRAVENOUS

## 2017-07-14 MED ORDER — FENTANYL CITRATE (PF) 100 MCG/2ML IJ SOLN
INTRAMUSCULAR | Status: DC | PRN
  Administered 2017-07-14: 50 ug via INTRAVENOUS

## 2017-07-14 MED ORDER — KETOROLAC TROMETHAMINE 30 MG/ML IJ SOLN
INTRAMUSCULAR | Status: AC
  Filled 2017-07-14: qty 1

## 2017-07-14 MED ORDER — PROPOFOL 500 MG/50ML IV EMUL
INTRAVENOUS | Status: DC | PRN
  Administered 2017-07-14: 100 ug/kg/min via INTRAVENOUS

## 2017-07-14 MED ORDER — FENTANYL CITRATE (PF) 100 MCG/2ML IJ SOLN: 25.0000 ug | INTRAMUSCULAR | Status: DC | PRN

## 2017-07-14 MED ORDER — PROPOFOL 10 MG/ML IV BOLUS
INTRAVENOUS | Status: DC | PRN
  Administered 2017-07-14 (×2): 10 mg via INTRAVENOUS
  Administered 2017-07-14: 20 mg via INTRAVENOUS
  Administered 2017-07-14: 30 mg via INTRAVENOUS

## 2017-07-14 MED ORDER — FENTANYL CITRATE (PF) 100 MCG/2ML IJ SOLN
INTRAMUSCULAR | Status: AC
  Filled 2017-07-14: qty 2

## 2017-07-14 MED ORDER — FAMOTIDINE 20 MG PO TABS
ORAL_TABLET | ORAL | Status: AC
  Administered 2017-07-14: 20 mg via ORAL
  Filled 2017-07-14: qty 1

## 2017-07-14 MED ORDER — LIDOCAINE HCL (PF) 2 % IJ SOLN
INTRAMUSCULAR | Status: AC
  Filled 2017-07-14: qty 10

## 2017-07-14 MED ORDER — ONDANSETRON HCL 4 MG/2ML IJ SOLN
4.0000 mg | Freq: Once | INTRAMUSCULAR | Status: AC | PRN
  Administered 2017-07-14: 4 mg via INTRAVENOUS

## 2017-07-14 MED ORDER — ROCURONIUM BROMIDE 50 MG/5ML IV SOLN
INTRAVENOUS | Status: AC
  Filled 2017-07-14: qty 1

## 2017-07-14 MED ORDER — OXYCODONE-ACETAMINOPHEN 5-325 MG PO TABS
ORAL_TABLET | ORAL | Status: AC
  Administered 2017-07-14: 1 via ORAL
  Filled 2017-07-14: qty 1

## 2017-07-14 SURGICAL SUPPLY — 24 items
ABLATOR ENDOMETRIAL MYOSURE (ABLATOR) ×3 IMPLANT
BAG COUNTER SPONGE EZ (MISCELLANEOUS) ×2 IMPLANT
CANISTER SUC SOCK COL 7IN (MISCELLANEOUS) ×3 IMPLANT
CATH ROBINSON RED A/P 16FR (CATHETERS) ×3 IMPLANT
COUNTER SPONGE BAG EZ (MISCELLANEOUS) ×1
DEVICE MYOSURE LITE (MISCELLANEOUS) ×2 IMPLANT
ELECT REM PT RETURN 9FT ADLT (ELECTROSURGICAL) ×3
ELECTRODE REM PT RTRN 9FT ADLT (ELECTROSURGICAL) ×1 IMPLANT
GLOVE BIO SURGEON STRL SZ8 (GLOVE) ×3 IMPLANT
GOWN STRL REUS W/ TWL LRG LVL3 (GOWN DISPOSABLE) ×1 IMPLANT
GOWN STRL REUS W/ TWL XL LVL3 (GOWN DISPOSABLE) ×1 IMPLANT
GOWN STRL REUS W/TWL LRG LVL3 (GOWN DISPOSABLE) ×2
GOWN STRL REUS W/TWL XL LVL3 (GOWN DISPOSABLE) ×2
MYOSURE LITE POLYP REMOVAL (MISCELLANEOUS) ×3 IMPLANT
PACK DNC HYST (MISCELLANEOUS) ×3 IMPLANT
PAD OB MATERNITY 4.3X12.25 (PERSONAL CARE ITEMS) ×3 IMPLANT
PAD PREP 24X41 OB/GYN DISP (PERSONAL CARE ITEMS) ×3 IMPLANT
SOL .9 NS 3000ML IRR  AL (IV SOLUTION) ×2
SOL .9 NS 3000ML IRR UROMATIC (IV SOLUTION) ×1 IMPLANT
STRAP SAFETY BODY (MISCELLANEOUS) ×3 IMPLANT
TOWEL OR 17X26 4PK STRL BLUE (TOWEL DISPOSABLE) ×3 IMPLANT
TUBING CONNECTING 10 (TUBING) ×2 IMPLANT
TUBING CONNECTING 10' (TUBING) ×1
TUBING HYSTEROSCOPY DOLPHIN (MISCELLANEOUS) ×3 IMPLANT

## 2017-07-14 NOTE — Transfer of Care (Signed)
Immediate Anesthesia Transfer of Care Note  Patient: Kristina Cruz  Procedure(s) Performed: DILATATION AND CURETTAGE /HYSTEROSCOPY (N/A )  Patient Location: PACU  Anesthesia Type:General  Level of Consciousness: awake  Airway & Oxygen Therapy: Patient Spontanous Breathing and Patient connected to face mask oxygen  Post-op Assessment: Report given to RN and Post -op Vital signs reviewed and stable  Post vital signs: Reviewed and stable  Last Vitals:  Vitals:   07/14/17 1357 07/14/17 1518  BP: (!) 167/69 118/73  Pulse: 73 85  Resp: 20 15  Temp: 36.7 C (!) 36.2 C  SpO2: 100% 99%    Last Pain:  Vitals:   07/14/17 1357  TempSrc: Oral         Complications: No apparent anesthesia complications

## 2017-07-14 NOTE — Anesthesia Postprocedure Evaluation (Signed)
Anesthesia Post Note  Patient: Kristina Cruz  Procedure(s) Performed: DILATATION AND CURETTAGE /HYSTEROSCOPY (N/A )  Patient location during evaluation: PACU Anesthesia Type: General Level of consciousness: awake and alert Pain management: pain level controlled Vital Signs Assessment: post-procedure vital signs reviewed and stable Respiratory status: spontaneous breathing, nonlabored ventilation, respiratory function stable and patient connected to nasal cannula oxygen Cardiovascular status: blood pressure returned to baseline and stable Postop Assessment: no apparent nausea or vomiting Anesthetic complications: no     Last Vitals:  Vitals:   07/14/17 1548 07/14/17 1605  BP: (!) 155/74 (!) 152/80  Pulse: 69 63  Resp: 15 16  Temp: 36.6 C   SpO2: 100% 100%    Last Pain:  Vitals:   07/14/17 1629  TempSrc:   PainSc: 7                  Cleda MccreedyJoseph K Burleigh Brockmann

## 2017-07-14 NOTE — H&P (Signed)
History and Physical Interval Note:  07/14/2017 1:34 PM  Kristina Cruz  has presented today for surgery, with the diagnosis of POSTMENOPAUSAL BLEEDING  The various methods of treatment have been discussed with the patient and family. After consideration of risks, benefits and other options for treatment, the patient has consented to  Procedure(s): DILATATION AND CURETTAGE /HYSTEROSCOPY (N/A) as a surgical intervention .  The patient's history has been reviewed, patient examined, no change in status, stable for surgery.  Pt has the following beta blocker history-  Not taking Beta Blocker.  I have reviewed the patient's chart and labs.  Questions were answered to the patient's satisfaction.    [name]

## 2017-07-14 NOTE — Anesthesia Post-op Follow-up Note (Signed)
Anesthesia QCDR form completed.        

## 2017-07-14 NOTE — Anesthesia Preprocedure Evaluation (Signed)
Anesthesia Evaluation  Patient identified by MRN, date of birth, ID band Patient awake    Reviewed: Allergy & Precautions, H&P , NPO status , Patient's Chart, lab work & pertinent test results, reviewed documented beta blocker date and time   Airway Mallampati: III  TM Distance: >3 FB Neck ROM: full    Dental  (+) Poor Dentition, Teeth Intact   Pulmonary neg pulmonary ROS,    Pulmonary exam normal        Cardiovascular Exercise Tolerance: Poor hypertension, On Medications negative cardio ROS Normal cardiovascular exam Rhythm:regular Rate:Normal     Neuro/Psych negative neurological ROS  negative psych ROS   GI/Hepatic negative GI ROS, Neg liver ROS,   Endo/Other  negative endocrine ROSdiabetes, Well Controlled, Oral Hypoglycemic AgentsMorbid obesity  Renal/GU negative Renal ROS  negative genitourinary   Musculoskeletal   Abdominal   Peds  Hematology negative hematology ROS (+) anemia ,   Anesthesia Other Findings Past Medical History: No date: Anemia No date: Back pain No date: Diabetes mellitus without complication (HCC) No date: Hypertension No date: Restless leg syndrome Past Surgical History: 2015: POLYPECTOMY BMI    Body Mass Index:  58.36 kg/m     Reproductive/Obstetrics negative OB ROS                             Anesthesia Physical Anesthesia Plan  ASA: III  Anesthesia Plan: General   Post-op Pain Management:    Induction:   PONV Risk Score and Plan: 4 or greater  Airway Management Planned:   Additional Equipment:   Intra-op Plan:   Post-operative Plan:   Informed Consent: I have reviewed the patients History and Physical, chart, labs and discussed the procedure including the risks, benefits and alternatives for the proposed anesthesia with the patient or authorized representative who has indicated his/her understanding and acceptance.   Dental Advisory  Given  Plan Discussed with: CRNA  Anesthesia Plan Comments: (Pt desires to proceed with GA/MAC  But advance as needed if GOT indicated.  JA)        Anesthesia Quick Evaluation

## 2017-07-14 NOTE — Op Note (Signed)
Operative Note  07/14/2017  PRE-OP DIAGNOSIS: Postmenopausal Bleeding. Endometrial Hyperplasia without atypia, Polyp  POST-OP DIAGNOSIS: same   SURGEON: Annamarie MajorPaul Greogry Goodwyn, MD, FACOG  PROCEDURE: Procedure(s): DILATATION AND CURETTAGE /HYSTEROSCOPY  Polypectomy  ANESTHESIA: Choice   ESTIMATED BLOOD LOSS: Min   SPECIMENS:  ECC, Polyp, EMC  FLUID DEFICIT: Min  COMPLICATIONS: None  DISPOSITION: PACU - hemodynamically stable.  CONDITION: stable  FINDINGS: Exam under anesthesia revealed small, mobile  uterus with no masses and bilateral adnexa without masses or fullness. Hysteroscopy revealed a polyp appearing within the uterine cavity with bilateral tubal ostia and normal appearing endocervical canal.  PROCEDURE IN DETAIL: After informed consent was obtained, the patient was taken to the operating room where anesthesia was obtained without difficulty. The patient was positioned in the dorsal lithotomy position in [place] stirrups. The patient's bladder was catheterized with an in and out foley catheter. The patient was examined under anesthesia, with the above noted findings. The weightedspeculum was placed inside the patient's vagina, and the the anterior lip of the cervix was seen and grasped with the tenaculum.  An Endocervical specimen was obtained with a kevorkian curette. The uterine cavity was sounded to 8cm, and then the cervix was progressively dilated to a 20French-Pratt dilator. The 30 degree hysteroscope was introduced, with saline fluid used to distend the intrauterine cavity, with the above noted findings.  Using the Myosure, the polyp is excised.  The hystersocope was removed and the uterine cavity was curetted until a gritty texture was noted, yielding endometrial curettings. Excellent hemostasis was noted, and all instruments were removed, with excellent hemostasis noted throughout. She was then taken out of dorsal lithotomy. Minimal discrepancy in fluid was noted.  The patient  tolerated the procedure well. Sponge, lap and needle counts were correct x2. The patient was taken to recovery room in excellent condition.  Annamarie MajorPaul Driana Dazey, MD, Merlinda FrederickFACOG [facility], [facility] 07/14/2017  3:11 PM

## 2017-07-14 NOTE — Discharge Instructions (Signed)
Hysteroscopy, Care After °Refer to this sheet in the next few weeks. These instructions provide you with information on caring for yourself after your procedure. Your health care provider may also give you more specific instructions. Your treatment has been planned according to current medical practices, but problems sometimes occur. Call your health care provider if you have any problems or questions after your procedure. °What can I expect after the procedure? °After your procedure, it is typical to have the following: °· You may have some cramping. This normally lasts for a couple days. °· You may have bleeding. This can vary from light spotting for a few days to menstrual-like bleeding for 3-7 days. ° °Follow these instructions at home: °· Rest for the first 1-2 days after the procedure. °· Only take over-the-counter or prescription medicines as directed by your health care provider. Do not take aspirin. It can increase the chances of bleeding. °· Take showers instead of baths for 2 weeks or as directed by your health care provider. °· Do not drive for 24 hours or as directed. °· Do not drink alcohol while taking pain medicine. °· Do not use tampons, douche, or have sexual intercourse for 2 weeks or until your health care provider says it is okay. °· Take your temperature twice a day for 4-5 days. Write it down each time. °· Follow your health care provider's advice about diet, exercise, and lifting. °· If you develop constipation, you may: °? Take a mild laxative if your health care provider approves. °? Add bran foods to your diet. °? Drink enough fluids to keep your urine clear or pale yellow. °· Try to have someone with you or available to you for the first 24-48 hours, especially if you were given a general anesthetic. °· Follow up with your health care provider as directed. °Contact a health care provider if: °· You feel dizzy or lightheaded. °· You feel sick to your stomach (nauseous). °· You have  abnormal vaginal discharge. °· You have a rash. °· You have pain that is not controlled with medicine. °Get help right away if: °· You have bleeding that is heavier than a normal menstrual period. °· You have a fever. °· You have increasing cramps or pain, not controlled with medicine. °· You have new belly (abdominal) pain. °· You pass out. °· You have pain in the tops of your shoulders (shoulder strap areas). °· You have shortness of breath. °This information is not intended to replace advice given to you by your health care provider. Make sure you discuss any questions you have with your health care provider. °Document Released: 05/25/2013 Document Revised: 01/10/2016 Document Reviewed: 03/03/2013 °Elsevier Interactive Patient Education © 2017 [employer] ° ° ° °AMBULATORY SURGERY  °DISCHARGE INSTRUCTIONS ° ° °1) The drugs that you were given will stay in your system until tomorrow so for the next 24 hours you should not: ° °A) Drive an automobile °B) Make any legal decisions °C) Drink any alcoholic beverage ° ° °2) You may resume regular meals tomorrow.  Today it is better to start with liquids and gradually work up to solid foods. ° °You may eat anything you prefer, but it is better to start with liquids, then soup and crackers, and gradually work up to solid foods. ° ° °3) Please notify your doctor immediately if you have any unusual bleeding, trouble breathing, redness and pain at the surgery site, drainage, fever, or pain not relieved by medication. ° ° ° °4) Additional   Instructions: ° ° ° ° ° ° ° °Please contact your physician with any problems or Same Day Surgery at [phone], Monday through Friday 6 am to 4 pm, or [place] [facility] [place] Main number at [phone]. °

## 2017-07-15 ENCOUNTER — Encounter: Payer: Self-pay | Admitting: Obstetrics & Gynecology

## 2017-07-17 LAB — SURGICAL PATHOLOGY

## 2017-07-20 ENCOUNTER — Encounter: Payer: Self-pay | Admitting: Obstetrics & Gynecology

## 2017-07-20 ENCOUNTER — Ambulatory Visit (INDEPENDENT_AMBULATORY_CARE_PROVIDER_SITE_OTHER): Payer: Medicare Other | Admitting: Obstetrics & Gynecology

## 2017-07-20 VITALS — BP 140/80 | Ht 62.0 in | Wt 340.0 lb

## 2017-07-20 DIAGNOSIS — N840 Polyp of corpus uteri: Secondary | ICD-10-CM

## 2017-07-20 NOTE — Progress Notes (Signed)
  Postoperative Follow-up Patient presents post op [facility] for bleeding and endometrial thickening; found to have POLYP and this was excised; PATHOLOGY forund to have hyperplasia without atypia, 1 week ago.  Subjective: Patient reports still having continuous bleeding, wears a pad, and this is no change or no improvement in her preop symptoms. Eating a regular diet without difficulty. The patient is not having any pain.  Activity: normal activities of daily living. Patient reports vaginal sx's of Irregular bleeding  Objective: BP 140/80   Ht 5\' 2"  (1.575 m)   Wt (!) 340 lb (154.2 kg)   LMP 07/02/2017   BMI 62.19 kg/m  Physical Exam  Constitutional: She is oriented to person, place, and time. She appears well-developed and well-nourished. No distress.  Musculoskeletal: Normal range of motion.  Neurological: She is alert and oriented to person, place, and time.  Skin: Skin is warm and dry.  Psychiatric: She has a normal mood and affect.  Vitals reviewed.   Assessment: s/p :  Hyst D&C with bleeding; to monitor and expect to lessen and stop soon.  Plan: Patient has done well after surgery with no apparent complications.  I have discussed the post-operative course to date, and the expected progress moving forward.  The patient understands what complications to be concerned about.  I will see the patient in routine follow up, or sooner if needed.   PATHOLOGY d/w pt. Activity plan: No restriction.  Letitia Libraobert Paul Nazaiah Navarrete 07/20/2017, 9:36 AM

## 2017-08-25 ENCOUNTER — Ambulatory Visit: Payer: Medicare Other | Admitting: Obstetrics & Gynecology

## 2017-11-10 ENCOUNTER — Ambulatory Visit: Payer: Medicare Other | Admitting: Obstetrics & Gynecology

## 2017-11-11 ENCOUNTER — Ambulatory Visit (INDEPENDENT_AMBULATORY_CARE_PROVIDER_SITE_OTHER): Payer: Medicare Other | Admitting: Obstetrics & Gynecology

## 2017-11-11 ENCOUNTER — Encounter: Payer: Self-pay | Admitting: Obstetrics & Gynecology

## 2017-11-11 VITALS — BP 130/80 | HR 52 | Ht 63.0 in | Wt 330.0 lb

## 2017-11-11 DIAGNOSIS — N840 Polyp of corpus uteri: Secondary | ICD-10-CM | POA: Diagnosis not present

## 2017-11-11 DIAGNOSIS — N950 Postmenopausal bleeding: Secondary | ICD-10-CM | POA: Insufficient documentation

## 2017-11-11 MED ORDER — MEDROXYPROGESTERONE ACETATE 10 MG PO TABS: 10.0000 mg | ORAL_TABLET | Freq: Every day | ORAL | 0 refills | Status: DC

## 2017-11-11 NOTE — Progress Notes (Signed)
  Postmenopausal Bleeding Patient complains of vaginal bleeding. She has been menopausal for several years. Currently on no HRT.  Pt had D&C and Polypectomy in Nov 2018 with Endometrial Hyperplasia without Atypia identified.  She has not bled since surgery until spotting for 2 days in last Feb and then more red bleeding on an intermitant basis in March, including today.  Bleeding is described as less flow than a normal period and has occurred several times. Other menopausal symptoms include: none. Workup to date: as above..  Menstrual History: OB History    Gravida  2   Para  1   Term  1   Preterm  0   AB  1   Living  0     SAB  1   TAB  0   Ectopic  0   Multiple  0   Live Births  0         PMHx: She  has a past medical history of Anemia, Back pain, Diabetes mellitus without complication (HCC), Hypertension, and Restless leg syndrome. Also,  has a past surgical history that includes Polypectomy (2015) and Hysteroscopy w/D&C (N/A, 07/14/2017)., family history includes Breast cancer (age of onset: 6863) in her mother; Ovarian cancer in her paternal aunt.,  reports that she has never smoked. She has never used smokeless tobacco. She reports that she does not drink alcohol or use drugs.  She has a current medication list which includes the following prescription(s): albuterol, furosemide, hydrochlorothiazide, losartan, metformin, methocarbamol, methylprednisolone, ranitidine, ropinirole, ropinirole, tramadol, ferrous gluconate, medroxyprogesterone, oxycodone-acetaminophen, oxycodone-acetaminophen, and prednisone. Also, is allergic to contrast media [iodinated diagnostic agents] and other.  Review of Systems  Constitutional: Negative for chills, fever and malaise/fatigue.  HENT: Negative for congestion, sinus pain and sore throat.   Eyes: Negative for blurred vision and pain.  Respiratory: Negative for cough and wheezing.   Cardiovascular: Negative for chest pain and leg swelling.    Gastrointestinal: Negative for abdominal pain, constipation, diarrhea, heartburn, nausea and vomiting.  Genitourinary: Negative for dysuria, frequency, hematuria and urgency.  Musculoskeletal: Negative for back pain, joint pain, myalgias and neck pain.  Skin: Negative for itching and rash.  Neurological: Negative for dizziness, tremors and weakness.  Endo/Heme/Allergies: Does not bruise/bleed easily.  Psychiatric/Behavioral: Negative for depression. The patient is not nervous/anxious and does not have insomnia.     Objective: BP 130/80   Pulse (!) 52   Ht 5\' 3"  (1.6 m)   Wt (!) 330 lb (149.7 kg)   BMI 58.46 kg/m  Physical Exam  Constitutional: She is oriented to person, place, and time. She appears well-developed and well-nourished. No distress.  Musculoskeletal: Normal range of motion.  Neurological: She is alert and oriented to person, place, and time.  Skin: Skin is warm and dry.  Psychiatric: She has a normal mood and affect.  Vitals reviewed.  ASSESSMENT/PLAN:   Problem List Items Addressed This Visit      Genitourinary   Endometrial polyp - Primary    Other Visit Diagnoses    Post-menopausal bleeding        Provera currently.  If continues to bleed then would repeat US to assess for thickening/ growth.  Risks of cancer low after recent procedure with full biopsies, curretage, and polypectomy, yet may need to investigate again at least to treat discomforts of bleeding.  Annamarie MajorPaul Harris, MD, Merlinda FrederickFACOG [facility], [facility] 11/11/2017  9:57 AM

## 2017-11-11 NOTE — Patient Instructions (Signed)
Provera 1 pill daily for 10 days Call if bleeding persists or worsens

## 2017-11-27 ENCOUNTER — Ambulatory Visit: Payer: Medicare Other | Admitting: Obstetrics & Gynecology

## 2017-12-03 ENCOUNTER — Encounter: Payer: Self-pay | Admitting: Obstetrics & Gynecology

## 2017-12-03 ENCOUNTER — Ambulatory Visit (INDEPENDENT_AMBULATORY_CARE_PROVIDER_SITE_OTHER): Payer: Medicare Other | Admitting: Obstetrics & Gynecology

## 2017-12-03 VITALS — BP 158/90 | Ht 61.0 in | Wt 317.0 lb

## 2017-12-03 DIAGNOSIS — N8501 Benign endometrial hyperplasia: Secondary | ICD-10-CM | POA: Diagnosis not present

## 2017-12-03 DIAGNOSIS — N950 Postmenopausal bleeding: Secondary | ICD-10-CM

## 2017-12-03 MED ORDER — MEDROXYPROGESTERONE ACETATE 10 MG PO TABS: 20.0000 mg | ORAL_TABLET | Freq: Every day | ORAL | 2 refills | Status: DC

## 2017-12-03 NOTE — Progress Notes (Signed)
  History of Present Illness:  Kristina Cruz is a 57 y.o. who was started on Provera for AUB ([facility]  approximately 3 weeks ago. Since that time, she states that her symptoms improved for 5 days then started bleeding worse than before, then stopped yesterday.  Very concerned about bleeding, fatigue.  Thought she was dying.      PMHx: She  has a past medical history of Anemia, Back pain, Diabetes mellitus without complication (HCC), Hypertension, and Restless leg syndrome. Also,  has a past surgical history that includes Polypectomy (2015) and Hysteroscopy w/D&C (N/A, 07/14/2017)., family history includes Breast cancer (age of onset: 3663) in her mother; Ovarian cancer in her paternal aunt.,  reports that she has never smoked. She has never used smokeless tobacco. She reports that she does not drink alcohol or use drugs. No outpatient medications have been marked as taking for the 12/03/17 encounter (Office Visit) with Nadara MustardHarris, Robert P, MD.  . Also, is allergic to contrast media [iodinated diagnostic agents] and other..  Review of Systems  All other systems reviewed and are negative.  Physical Exam:  BP (!) 158/90   Ht 5\' 1"  (1.549 m)   Wt (!) 317 lb (143.8 kg)   BMI 59.90 kg/m  Body mass index is 59.9 kg/m. Constitutional: Well nourished, well developed female in no acute distress.  Abdomen: diffusely non tender to palpation, non distended, and no masses, hernias Neuro: Grossly intact Psych:  Normal mood and affect.    Assessment:  Problem List Items Addressed This Visit      Genitourinary   Endometrial hyperplasia without atypia, simple - Primary   Relevant Orders   Ambulatory referral to Gynecologic Oncology     Other   Post-menopausal bleeding   Relevant Orders   Ambulatory referral to Gynecologic Oncology    Plan: Monitor for now bleeding since it has slowed/stopped.  Future Provera at 20 mg dose discussed. Plan  referral to [facility] due to hyperplasia dx and continued bleeding as well as her obesity as risk factor for surgery, if that is next best option for her.  Other medicine options to be discussed and determined by onc as well.  She was amenable to this plan  A total of 15 minutes were spent face-to-face with the patient during this encounter and over half of that time dealt with counseling and coordination of care.  Annamarie MajorPaul Harris, MD, Merlinda FrederickFACOG [facility], [facility] 12/03/2017  2:45 PM

## 2017-12-08 NOTE — Progress Notes (Deleted)
Gynecologic Oncology Consult Visit   Referring Provider: Dr. Velora Mediateobert Harris [facility])  Chief Complaint: Endometrial Hyperplasia without Atypia  Subjective:  Kristina Cruz is a 57 y.o. female who is seen in consultation from Dr. Tiburcio PeaHarris for endometrial hyperplasia without atypia.   Patient was initially seen by Dr. Tiburcio PeaHarris for abnormal uterine bleeding and has [facility]  In March 2019 she was started on Provera for AUB.  Initially, her bleeding symptoms improved and then worsened to the point that patient what she was dying due to fatigue.  Bleeding stopped on approximately 12/02/2017.   Pathology: 07/14/2017 DIAGNOSIS:  A. ENDOCERVICAL CURETTINGS; DILATATION AND CURETTAGE:  - BENIGN CERVICAL TISSUE.  - POLYPOID PROLIFERATIVE ENDOMETRIUM WITH FOCAL BENIGN ENDOMETRIAL  HYPERPLASIA.   B. ENDOMETRIAL CURETTINGS; DILATATION AND CURETTAGE:  - POLYPOID PROLIFERATIVE ENDOMETRIUM WITH BENIGN ENDOMETRIAL  HYPERPLASIA.   C. ENDOMETRIAL POLYP; POLYPECTOMY:  - ENDOMETRIAL POLYP WITH BENIGN ENDOMETRIAL HYPERPLASIA.   Note: Atypia is not seen in this material.    Today, patient reports ***   Problem List: Patient Active Problem List   Diagnosis Date Noted  . 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 Endometrial polyp 11/11/2017  . Post-menopausal bleeding 11/11/2017  . Endometrial thickening on ultrasound 04/27/2017  . Irregular menstrual bleeding 04/27/2017  . Morbid obesity (HCC) 04/27/2017    Past Medical History: Past Medical History:  Diagnosis Date  . Anemia   . Back pain   . Diabetes mellitus without complication (HCC)   . Hypertension   . Restless leg syndrome     Past Surgical History: Past Surgical History:  Procedure Laterality Date  . HYSTEROSCOPY W/D&C N/A 07/14/2017   Procedure: DILATATION AND CURETTAGE /HYSTEROSCOPY;  Surgeon: Nadara MustardHarris, Robert P, MD;  Location:  [facility] ORS;  Service: Gynecology;  Laterality: N/A;  . POLYPECTOMY  2015    Past Gynecologic History:  Menarche: {NUMBERS 0-12:18577} Menstrual details: Lasts {NUMBERS 0-12:18577} days, {DESC; menstrual flow:21146} Menses regular: {yes no:20984} Last Menstrual Period: *** History of OCP/HRT use: *** History of Abnormal pap: {yes no:20984}, {PAP RESULT:21077} Last pap: {Blank multiple:19196} History of STDs: {STD history:20597} Contraception: {contraceptive methods:21883} Sexually active: {yes no unk:32069}  OB History:  OB History  Gravida Para Term Preterm AB Living  2 1 1  0 1 0  SAB TAB Ectopic Multiple Live Births  1 0 0 0 0    # Outcome Date GA Lbr Len/2nd Weight Sex Delivery Anes PTL Lv  2 Term 10/25/91    F Vag-Spont     1 SAB             Family History: Family History  Problem Relation Age of Onset  . Breast cancer Mother 2663  . Ovarian cancer Paternal Aunt        ?    Social History: Social History   Socioeconomic History  . Marital status: Single    Spouse name: Not on file  . Number of children: Not on file  . Years of education: Not on file  . Highest education level: Not on file  Occupational History  . Not on file  Social Needs  . Financial resource strain: Not on file  . Food insecurity:    Worry: Not on file    Inability: Not on file  . Transportation needs:    Medical: Not on file    Non-medical: Not on file  Tobacco Use  . Smoking status: Never Smoker  . Smokeless tobacco: Never  Used  Substance and Sexual Activity  . Alcohol use: No  . Drug use: No  . Sexual activity: Never    Birth control/protection: None  Lifestyle  . Physical activity:    Days per week: Not on file    Minutes per session: Not on file  . Stress: Not on file  Relationships  . Social connections:    Talks on phone: Not on file    Gets together: Not on file    Attends religious service: Not on file    Active member of club or organization: Not on file    Attends  meetings of clubs or organizations: Not on file    Relationship status: Not on file  . Intimate partner violence:    Fear of current or ex partner: Not on file    Emotionally abused: Not on file    Physically abused: Not on file    Forced sexual activity: Not on file  Other Topics Concern  . Not on file  Social History Narrative  . Not on file    Allergies: Allergies  Allergen Reactions  . Contrast Media [Iodinated Diagnostic Agents] Hives  . Other     Pt states , " I am allergic to 2 pain meds , that I can not remember"    Current Medications: Current Outpatient Medications  Medication Sig Dispense Refill  . albuterol (PROVENTIL HFA;VENTOLIN HFA) 108 (90 BASE) MCG/ACT inhaler Inhale 2 puffs into the lungs every 4 (four) hours as needed for wheezing.    . ferrous gluconate (IRON 27) 240 (27 FE) MG tablet Take 1 tablet (240 mg total) by mouth 3 (three) times daily with meals. Take every other day 90 tablet 0  . furosemide (LASIX) 20 MG tablet Take 1 tablet (20 mg total) by mouth daily. 2 tablet 0  . hydrochlorothiazide (HYDRODIURIL) 25 MG tablet Take 25 mg by mouth at bedtime.    Marland Kitchen losartan (COZAAR) 100 MG tablet     . medroxyPROGESTERone (PROVERA) 10 MG tablet Take 2 tablets (20 mg total) by mouth daily. 30 tablet 2  . metFORMIN (GLUCOPHAGE) 500 MG tablet Take 500 mg daily with breakfast by mouth.     . methocarbamol (ROBAXIN-750) 750 MG tablet Take 1 tablet (750 mg total) by mouth 4 (four) times daily. 20 tablet 0  . methylPREDNISolone (MEDROL DOSEPAK) 4 MG TBPK tablet Take Tapered dose as directed 21 tablet 0  . oxyCODONE-acetaminophen (PERCOCET) 5-325 MG tablet Take 1 tablet by mouth every 4 (four) hours as needed for moderate pain or severe pain. (Patient not taking: Reported on 11/11/2017) 42 tablet 0  . oxyCODONE-acetaminophen (PERCOCET/ROXICET) 5-325 MG tablet Take 1 tablet by mouth every 4 (four) hours as needed for severe pain. (Patient not taking: Reported on 07/06/2017) 20  tablet 0  . predniSONE (DELTASONE) 20 MG tablet Take 3 tablets (60 mg total) by mouth daily with breakfast. (Patient not taking: Reported on 07/06/2017) 12 tablet 0  . ranitidine (ZANTAC) 150 MG tablet Take 150 mg by mouth every morning.    Marland Kitchen rOPINIRole (REQUIP) 0.25 MG tablet Take 1 mg by mouth at bedtime.    Marland Kitchen rOPINIRole (REQUIP) 2 MG tablet Take 2 mg at bedtime by mouth.    . traMADol (ULTRAM) 50 MG tablet Take 1 tablet (50 mg total) by mouth every 6 (six) hours as needed. 20 tablet 0   No current facility-administered medications for this visit.     Review of Systems Review of Systems  Constitutional: Negative.  HENT:  Negative.   Eyes: Negative.   Respiratory: Negative.   Cardiovascular: Negative.   Gastrointestinal: Negative.   Endocrine: Negative.   Genitourinary: Negative.    Musculoskeletal: Negative.   Skin: Negative.   Neurological: Negative.   Hematological: Negative.   Psychiatric/Behavioral: Negative.    Objective:  Physical Examination:  There were no vitals taken for this visit.    ECOG Performance Status: {DESC; ECOG PERFORMANCE STATUS (NQF 385):19948:p}  GENERAL: Patient is a well appearing female in no acute distress HEENT:  Sclerae anicteric.  Oropharynx clear and moist. No ulcerations or evidence of oropharyngeal candidiasis. Neck is supple.  NODES:  No cervical, supraclavicular, or axillary lymphadenopathy palpated.  LUNGS:  Clear to auscultation bilaterally.  No wheezes or rhonchi. HEART:  Regular rate and rhythm. No murmur appreciated. ABDOMEN:  Soft, nontender.  Positive, normoactive bowel sounds. No organomegaly palpated. MSK:  No focal spinal tenderness to palpation. Full range of motion bilaterally in the upper extremities. EXTREMITIES:  No peripheral edema.   SKIN:  Clear with no obvious rashes or skin changes. No nail dyscrasia. NEURO:  Nonfocal. Well oriented.  Appropriate affect.  Pelvic: Exam chaperoned by NP EGBUS: no lesions Cervix: no  lesions, nontender, mobile Vagina: no lesions, no discharge or bleeding Uterus: normal size, nontender, mobile Adnexa: no palpable masses Rectovaginal: confirmatory  Lab Review Labs on site today: ***  Radiologic Imaging: ***    Assessment:  Kristina Cruz is a 57 y.o. female diagnosed with {insert grade of cancer if applicable} {GYN ONC Diagnosis:32727}.    Medical co-morbidities complicating care: morbid obesity (BMI 59.90)  Plan:   Problem List Items Addressed This Visit      Genitourinary   Endometrial hyperplasia without atypia, simple - Primary     Other   Morbid obesity (HCC)   Post-menopausal bleeding      We discussed options for management including ***. Based on *** , we recommend {GYN ONC Reccommendation:32802}.  {Insert risk statement for surgery if preop, .gynoncsurrisk} Suggested return to clinic in  {1-10:18281} {units:10146}.    The patient's diagnosis, an outline of the further diagnostic and laboratory studies which will be required, the recommendation for surgery, and alternatives were discussed with her and her accompanying family members.  All questions were answered to their satisfaction.  A total of *** minutes were spent with the patient/family today; ***% was spent in education, counseling and coordination of care for {GYN ONC Diagnosis:32727}.     Consuello Masse, DNP, AGNP-C [facility] [phone] (work cell) [phone] (office) 12/08/17 3:41 PM    CC:  [facility] [address] [place], [place] [address] [phone]   I personally interviewed and examined the patient. Agreed with the above/below plan of care. Patient/family questions were answered.  Evelena Asa, MD

## 2017-12-09 ENCOUNTER — Inpatient Hospital Stay: Payer: Medicare Other

## 2017-12-14 ENCOUNTER — Ambulatory Visit: Payer: Medicare Other | Admitting: Obstetrics & Gynecology

## 2017-12-16 ENCOUNTER — Telehealth: Payer: Self-pay | Admitting: *Deleted

## 2017-12-16 ENCOUNTER — Inpatient Hospital Stay: Payer: Medicare Other | Attending: Obstetrics and Gynecology | Admitting: Obstetrics and Gynecology

## 2017-12-16 VITALS — BP 134/82 | HR 64 | Temp 97.8°F | Resp 18 | Ht 61.0 in | Wt 333.4 lb

## 2017-12-16 DIAGNOSIS — G2581 Restless legs syndrome: Secondary | ICD-10-CM | POA: Insufficient documentation

## 2017-12-16 DIAGNOSIS — N950 Postmenopausal bleeding: Secondary | ICD-10-CM | POA: Diagnosis not present

## 2017-12-16 DIAGNOSIS — Z79891 Long term (current) use of opiate analgesic: Secondary | ICD-10-CM | POA: Insufficient documentation

## 2017-12-16 DIAGNOSIS — Z7984 Long term (current) use of oral hypoglycemic drugs: Secondary | ICD-10-CM | POA: Diagnosis not present

## 2017-12-16 DIAGNOSIS — E119 Type 2 diabetes mellitus without complications: Secondary | ICD-10-CM | POA: Insufficient documentation

## 2017-12-16 DIAGNOSIS — N8501 Benign endometrial hyperplasia: Secondary | ICD-10-CM | POA: Diagnosis not present

## 2017-12-16 DIAGNOSIS — Z79899 Other long term (current) drug therapy: Secondary | ICD-10-CM | POA: Diagnosis not present

## 2017-12-16 DIAGNOSIS — I10 Essential (primary) hypertension: Secondary | ICD-10-CM | POA: Diagnosis not present

## 2017-12-16 DIAGNOSIS — G8929 Other chronic pain: Secondary | ICD-10-CM | POA: Diagnosis not present

## 2017-12-16 NOTE — Telephone Encounter (Signed)
Faxed request for booking and consent to  OR attn: Leah for surgery date 5/22, sent booking request and consent to Harriett Sine at dr. Tiburcio Pea office requesting asst in OR and then to Preop to get visit prior to surgery. All faxes with confirmation that it went through.

## 2017-12-16 NOTE — Progress Notes (Signed)
Patient c/o unusual bleeding, N&V, diarrhea, rectal bleeding, and weakness form blood loss.

## 2017-12-16 NOTE — Progress Notes (Signed)
Gynecologic Oncology Consult Visit   Referring Provider: Dr. Annamarie Major  Chief Complaint: Endometrial hyperplasia w/o atypia  Subjective:  Kristina Cruz is a 57 y.o. female who is seen in consultation from Dr. Tiburcio Pea for endometrial hyperplasia without atypia.   She has a long history of post-menopausal bleeding. She estimates menopause around 2014 and had irregularly spaced bleeding episodes thereafter. On 04/20/2014 she underwent a D&C, hysteroscopy, and polypectomy with Dr. Jean Rosenthal.   Pathology revealed:   Part A: ENDOMETRIAL CURETTINGS:  - WEAKLY PROLIFERATIVE ENDOMETRIUM WITH FOCAL BREAKDOWN.  - BENIGN ENDOCERVICAL GLANDULAR TISSUE.  - NEGATIVE FOR DYSPLASIA AND MALIGNANCY.  Part B: ENDOMETRIAL POLYP:  - ENDOMETRIAL POLYP WITH NECROTIC DEBRIS.  - NEGATIVE FOR DYSPLASIA AND MALIGNANCY.   After surgery she continued to have intermittent episodes of irregularly spaced bleeding. She describes them as heavy and she suffered anemia and was put on iron supplements due to symptomatic anemia.   03/12/2017- Ultrasound showed endometrial thickness measuring 32 mm. Left ovary could not be visualized at that time.   She was seen by Dr. Tiburcio Pea in clinic on 04/27/2017 for bleeding and history of polyps. She underwent an EMB on 06/22/2017 which showed:  ENDOMETRIUM, BIOPSY:  - SIMPLE HYPERPLASIA WITHOUT ATYPIA IN A BACKGROUND OF DISORDERED PROLIFERATIVE PHASE ENDOMETRIUM WITH BREAKDOWN CHANGES.   On 07/14/2017 She underwent a hysteroscopy, D&C, and polypectomy with Dr. Tiburcio Pea for endometrial thickening, irregular menstrual bleeding, and endometrial hyperplasia w/o atypia.   07/14/2017-  DIAGNOSIS:  A. ENDOCERVICAL CURETTINGS; DILATATION AND CURETTAGE:  - BENIGN CERVICAL TISSUE.  - POLYPOID PROLIFERATIVE ENDOMETRIUM WITH FOCAL BENIGN ENDOMETRIAL  HYPERPLASIA.   B. ENDOMETRIAL CURETTINGS; DILATATION AND CURETTAGE:  - POLYPOID PROLIFERATIVE ENDOMETRIUM WITH BENIGN ENDOMETRIAL   HYPERPLASIA.   C. ENDOMETRIAL POLYP; POLYPECTOMY:  - ENDOMETRIAL POLYP WITH BENIGN ENDOMETRIAL HYPERPLASIA.   Note: Atypia is not seen in this material.   Today patient reports long history of irregular post-menopausal bleeding with associated anemia, dizziness, and prior LOC. She has had continuous bleeding with heavy flow and clots despite Provera.  Hgb=8.1 12/02/17.  Uses iron and vitamins, but has not needed blood transfusion.  Uses prednisone for hip and back pain.  Diabetic on Metformin.  Blood sugar reportedly under good control with metformin. Hgb A1C = 6.5 09/23/17  Problem List: Patient Active Problem List   Diagnosis Date Noted  . Restless leg syndrome 12/16/2017  . Hypertension 12/16/2017  . 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 Endometrial polyp 11/11/2017  . Post-menopausal bleeding 11/11/2017  . Endometrial thickening on ultrasound 04/27/2017  . Irregular menstrual bleeding 04/27/2017  . Morbid obesity (HCC) 04/27/2017   Past Medical History: Past Medical History:  Diagnosis Date  . Anemia   . Back pain   . Diabetes mellitus without complication (HCC)   . Hypertension   . Restless leg syndrome    Past Surgical History: Past Surgical History:  Procedure Laterality Date  . HYSTEROSCOPY W/D&C N/A 07/14/2017   Procedure: DILATATION AND CURETTAGE /HYSTEROSCOPY;  Surgeon: Nadara Mustard, MD;  Location: [facility] ORS;  Service: Gynecology;  Laterality: N/A;  . POLYPECTOMY  2015   Past Gynecologic History:  G2P1010.    OB History:  OB History  Gravida Para Term Preterm AB Living  0 1 0  SAB TAB Ectopic Multiple Live Births  1 0 0 0 0    # Outcome Date GA Lbr Len/2nd Weight Sex Delivery Anes PTL Lv  2 Term 10/25/91    F Vag-Spont  1 SAB             Family History: Family History  Problem Relation Age of Onset  . Breast cancer Mother 48  . Diabetes Mother   . Hypertension Mother   . Ovarian cancer Paternal Aunt        ?   . Diabetes Father   . Hypertension Father     Social History: Social History   Socioeconomic History  . Marital status: Single    Spouse name: Not on file  . Number of children: Not on file  . Years of education: Not on file  . Highest education level: Not on file  Occupational History  . Not on file  Social Needs  . Financial resource strain: Not on file  . Food insecurity:    Worry: Not on file    Inability: Not on file  . Transportation needs:    Medical: Not on file    Non-medical: Not on file  Tobacco Use  . Smoking status: Never Smoker  . Smokeless tobacco: Never Used  Substance and Sexual Activity  . Alcohol use: No  . Drug use: No  . Sexual activity: Never    Birth control/protection: None  Lifestyle  . Physical activity:    Days per week: Not on file    Minutes per session: Not on file  . Stress: Not on file  Relationships  . Social connections:    Talks on phone: Not on file    Gets together: Not on file    Attends religious service: Not on file    Active member of club or organization: Not on file    Attends meetings of clubs or organizations: Not on file    Relationship status: Not on file  . Intimate partner violence:    Fear of current or ex partner: Not on file    Emotionally abused: Not on file    Physically abused: Not on file    Forced sexual activity: Not on file  Other Topics Concern  . Not on file  Social History Narrative  . Not on file    Allergies: Allergies  Allergen Reactions  . Contrast Media [Iodinated Diagnostic Agents] Hives  . Other     Pt states , " I am allergic to 2 pain meds , that I can not remember"    Current Medications: Current Outpatient Medications  Medication Sig Dispense Refill  . ferrous gluconate (IRON 27) 240 (27 FE) MG tablet Take 1 tablet (240 mg total) by mouth 3 (three) times daily with meals. Take every other day 90 tablet 0  . furosemide (LASIX) 20 MG tablet Take 1 tablet (20 mg total) by mouth  daily. 2 tablet 0  . medroxyPROGESTERone (PROVERA) 10 MG tablet Take 2 tablets (20 mg total) by mouth daily. 30 tablet 2  . metFORMIN (GLUCOPHAGE) 500 MG tablet Take 500 mg daily with breakfast by mouth.     . ranitidine (ZANTAC) 150 MG tablet Take 150 mg by mouth every morning.    Marland Kitchen rOPINIRole (REQUIP) 0.25 MG tablet Take 1 mg by mouth at bedtime.    Marland Kitchen albuterol (PROVENTIL HFA;VENTOLIN HFA) 108 (90 BASE) MCG/ACT inhaler Inhale 2 puffs into the lungs every 4 (four) hours as needed for wheezing.    . hydrochlorothiazide (HYDRODIURIL) 25 MG tablet Take 25 mg by mouth at bedtime.    Marland Kitchen losartan (COZAAR) 100 MG tablet     . methocarbamol (ROBAXIN-750) 750 MG tablet Take 1 tablet (  750 mg total) by mouth 4 (four) times daily. (Patient not taking: Reported on 12/16/2017) 20 tablet 0  . methylPREDNISolone (MEDROL DOSEPAK) 4 MG TBPK tablet Take Tapered dose as directed (Patient not taking: Reported on 12/16/2017) 21 tablet 0  . oxyCODONE-acetaminophen (PERCOCET) 5-325 MG tablet Take 1 tablet by mouth every 4 (four) hours as needed for moderate pain or severe pain. (Patient not taking: Reported on 11/11/2017) 42 tablet 0  . oxyCODONE-acetaminophen (PERCOCET/ROXICET) 5-325 MG tablet Take 1 tablet by mouth every 4 (four) hours as needed for severe pain. (Patient not taking: Reported on 07/06/2017) 20 tablet 0  . predniSONE (DELTASONE) 20 MG tablet Take 3 tablets (60 mg total) by mouth daily with breakfast. (Patient not taking: Reported on 07/06/2017) 12 tablet 0  . rOPINIRole (REQUIP) 2 MG tablet Take 2 mg at bedtime by mouth.    . traMADol (ULTRAM) 50 MG tablet Take 1 tablet (50 mg total) by mouth every 6 (six) hours as needed. (Patient not taking: Reported on 12/16/2017) 20 tablet 0   No current facility-administered medications for this visit.     Review of Systems General:  no complaints Skin: no complaints Eyes: no complaints HEENT: no complaints Breasts: no complaints Pulmonary: no complaints Cardiac: no  complaints Gastrointestinal: nausea, vomiting, diarrhea, rectal bleeding Genitourinary/Sexual: no complaints Ob/Gyn: irregular bleeding Musculoskeletal: no complaints Hematology: no complaints Neurologic/Psych: no complaints   Objective:  Physical Examination:  BP 134/82 (BP Location: Right Arm, Patient Position: Sitting)   Pulse 64   Temp 97.8 F (36.6 C)   Resp 18   Ht  (1.549 m)   Wt (!) 333 lb 6.4 oz (151.2 kg)   SpO2 100%   BMI 63.00 kg/m     ECOG Performance Status: 2 - Symptomatic, <50% confined to bed  GENERAL: Patient is a well appearing female in no acute distress HEENT:  Sclerae anicteric.  Oropharynx clear and moist. No ulcerations or evidence of oropharyngeal candidiasis. Neck is supple.  NODES:  No cervical, supraclavicular, or axillary lymphadenopathy palpated.  LUNGS:  Clear to auscultation bilaterally.  No wheezes or rhonchi. HEART:  Regular rate and rhythm. No murmur appreciated. ABDOMEN:  Soft, nontender.  Positive, normoactive bowel sounds. No organomegaly palpated. MSK:  No focal spinal tenderness to palpation. Full range of motion bilaterally in the upper extremities. EXTREMITIES:  No peripheral edema.  Limited mobility in hips, L>R. SKIN:  Clear with no obvious rashes or skin changes. No nail dyscrasia. NEURO:  Nonfocal. Well oriented.  Appropriate affect. BREAST: Breasts appear normal, no suspicious masses, no skin or nipple changes or axillary nodes  Pelvic: Exam Chaperoned by NP EGBUS: no lesions Cervix: no lesions, nontender, mobile Vagina: no lesions, no discharge or bleeding Uterus: normal size, nontender, mobile Adnexa: no palpable masses Rectovaginal: confirmatory    Assessment:  Whittley Carandang is a 57 y.o. 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   Plan:   Problem  List Items Addressed This Visit      Genitourinary   Endometrial hyperplasia without atypia, simple - Primary     We discussed options for management including Mirena IUD, but patient is not interested in this option.  She is afraid of bleeding to death and would like to have hysterectomy, as a permanent solution. She is scheduled for TLH/BSO and possible pelvic/aortic LN dissection if cancer found on frozen section on 01/06/18 with Dr  Harris.  Discussed that there is a higher risk of conversion to laparotomy in view of her morbid obesity, as well as wound infection and other complications.    The risks of surgery were discussed in detail and she understands these to include infection; wound separation; hernia; vaginal cuff separation, injury to adjacent organs such as bowel, bladder, blood vessels, ureters and nerves; bleeding which may require blood transfusion; anesthesia risk; thromboembolic events; possible death; unforeseen complications; possible need for re-exploration; medical complications such as heart attack, stroke, pleural effusion and pneumonia; and, if staging performed the risk of lymphedema and lymphocyst.  The patient will receive DVT and antibiotic prophylaxis as indicated.  She voiced a clear understanding.  She had the opportunity to ask questions and written informed consent was obtained today.  She will receive VTE and antibiotic prophylaxis.     The patient's diagnosis, an outline of the further diagnostic and laboratory studies which will be required, the recommendation for surgery, and alternatives were discussed with her and her accompanying family members.  All questions were answered to their satisfaction.  A total of 60 minutes were spent with the patient/family today; 50% was spent in education, counseling and coordination of care for endometrial hyperplasia with anemia.   Consuello Masse, DNP, AGNP-C [facility] [phone] (work  cell) [phone] (office) 12/16/17 10:58 AM   CC:  Center, [facility] [address] [place], [place] [address] [phone]   I personally interviewed and examined the patient. Agreed with the above/below plan of care. Patient/family questions were answered.  Leida Lauth, MD

## 2017-12-16 NOTE — Progress Notes (Signed)
Pre and post operative teaching completed. Copy of education provided in writing in AVS. PAT to be arranged. Surgery will be arranged for 5/22 with Dr. Johnnette Litter and Tiburcio Pea. She will follow up with Dr. Tiburcio Pea for post operative visits. Oncology Nurse Navigator Documentation  Navigator Location: [facility] (12/16/17 1100)   )Navigator Encounter Type: Initial GynOnc (12/16/17 1100)                     Patient Visit Type: GynOnc (12/16/17 1100)   Barriers/Navigation Needs: Coordination of Care;Education (12/16/17 1100)                Acuity: Level 2 (12/16/17 1100)         Time Spent with Patient: 30 (12/16/17 1100)

## 2017-12-16 NOTE — Patient Instructions (Signed)
 Laparoscopy Laparoscopy is a procedure to diagnose diseases in the abdomen. During the procedure, a thin, lighted, pencil-sized instrument called a laparoscope is inserted into the abdomen through an incision. The laparoscope allows your health care provider to look at the organs inside your body. LET YOUR HEALTH CARE PROVIDER KNOW ABOUT:  Any allergies you have.  All medicines you are taking, including vitamins, herbs, eye drops, creams, and over-the-counter medicines.  Previous problems you or members of your family have had with the use of anesthetics.  Any blood disorders you have.  Previous surgeries you have had.  Medical conditions you have. RISKS AND COMPLICATIONS  Generally, this is a safe procedure. However, problems can occur, which may include:  Infection.  Bleeding.  Damage to other organs.  Allergic reaction to the anesthetics used during the procedure. BEFORE THE PROCEDURE  Do not eat or drink anything after midnight on the night before the procedure or as directed by your health care provider.  Ask your health care provider about: ? Changing or stopping your regular medicines. ? Taking medicines such as aspirin and ibuprofen. These medicines can thin your blood. Do not take these medicines before your procedure if your health care provider instructs you not to.  Plan to have someone take you home after the procedure. PROCEDURE  You may be given a medicine to help you relax (sedative).  You will be given a medicine to make you sleep (general anesthetic).  Your abdomen will be inflated with a gas. This will make your organs easier to see.  Small incisions will be made in your abdomen.  A laparoscope and other small instruments will be inserted into the abdomen through the incisions.  A tissue sample may be removed from an organ in the abdomen for examination.  The instruments will be removed from the abdomen.  The gas will be released.  The  incisions will be closed with stitches (sutures). AFTER THE PROCEDURE  Your blood pressure, heart rate, breathing rate, and blood oxygen level will be monitored often until the medicines you were given have worn off.   This information is not intended to replace advice given to you by your health care provider. Make sure you discuss any questions you have with your health care provider.               Clear Liquid Diet for GYN Oncology Patients Day Before Surgery The day before your scheduled surgery DO NOT EAT any solid foods.  We do want you to drink enough liquids, but NO MILK products.  We do not want you to be dehydrated.  Clear liquids are defined as no milk products and no pieces of any solid food. The following are all approved for you to drink the day before you surgery.  Chicken, Beef or Vegetable Broth (bouillon or consomm) - NO BROTH AFTER MIDNIGHT  Plain Jello  (no fruit)  Water  Strained lemonade or fruit punch  Gatorade (any flavor)  CLEAR Ensure or Boost Breeze  Fruit juices without pulp, such as apple, grape, or cranberry juice  Clear sodas - NO SODA AFTER MIDNIGHT  Ice Pops without bits of fruit or fruit pulp  Honey  Tea or coffee without milk or cream Any foods not on the above list should be avoided.                                                                                 DIVISION OF GYNECOLOGIC ONCOLOGY BOWEL PREP   The following instructions are extremely important to prepare for your surgery. Please follow them carefully   Step 1: Liquid Diet Instructions   The day before surgery, drink ONLY CLEAR LIQUIDS for breakfast, lunch, dinner and throughout the day.  Drink at least 64 oz of fluid.             CLEAR LIQUID EXAMPLES:             Beef, chicken or vegetable broth, sodas, coffee, tea (sugar, lemon             artificial sweeteners, honey are acceptable), juices (apple, grape, cranberry, any    mixture of clear juices). Kool-Aid,  Gatorade, Jell-o (without fruit), popsicles                          NO MILK, MILK PRODUCTS, NON-DAIRY CREAMERS    Step 2: Laxatives           The evening before surgery:   Time: around 5pm   Follow these instructions carefully.   Administer 1 Dulcolax suppository according to manufacturer instructions on the box. You will need to purchase this laxative at a pharmacy or grocery store.     Individual responses to laxatives vary; this prep may cause multiple bowel movements. It often works in 30 minutes and may take as long as 3 hours. Stay near an available bathroom.    It is important to stay hydrated. Ensure you are still drinking clear liquids.       IMPORTANT: FOR YOUR SAFETY, WE WILL HAVE TO CANCEL YOUR SURGERY IF YOU DO NOT FOLLOW THESE INSTRUCTIONS.    Do not eat anything after midnight (including gum or candy) prior to your surgery.  Avoid drinking carbonated beverages after midnight.  You can have clear liquids up until one hour before you arrive at the hospital. "Nothing by mouth" means no liquids, gum, candy, etc for one hour before your arrival time.                                              Bowel Symptoms After Surgery After gynecologic surgery, women often have temporary changes in bowel function (constipation and gas pain).  Following are tips to help prevent and treat common bowel problems.  It also tells you when to call the doctor.  This is important because some symptoms might be a sign of a more serious bowel problem such as obstruction (bowel blockage).  These problems are rare but can happen after gynecologic surgery.   Besides surgery, what can temporarily affect bowel function? 1. Dietary changes   2. Decreased physical activity   3.Antibiotics   4. Pain medication   How can I prevent constipation (three days or more without a stool)? 1. Include fiber in your diet: whole grains, raw or dried fruits & vegetables, prunes, prune/pear juiceDrink at least 8  glasses of liquid (preferably water) every day 2. Avoid: ? Gas forming foods such as broccoli, beans, peas, salads, cabbage, sweet potatoes ? Greasy, fatty, or fried foods 3. Activity helps bowel function return to normal, walk around the house at least 3-4 times each day for 15 minutes or longer, if tolerated.  Rocking in a rocking chair is preferable to sitting still. 4. Stool softeners: these are not laxatives,   but serve to soften the stool to avoid straining.  Take 2-4 times a day until normal bowel function returns         Examples: Colace or generic equivalent (Docusate) 5. Bulk laxatives: provide a concentrated source of fiber.  They do not stimulate the bowel.  Take 1-2 times each day until normal bowel function return.              Examples: Citrucel, Metamucil, Fiberal, Fibercon   What can I take for "Gas Pains"? 1. Simethicone (Mylicon, Gas-X, Maalox-Gas, Mylanta-Gas) take 3-4 times a day 2. Maalox Regular - take 3-4 times a day 3. Mylanta Regular - take 3-4 times a day   What can I take if I become constipated? 1. Start with stool softeners and add additional laxatives below as needed to have a bowel movement every 1-2 days  2. Stool softeners 1-2 tablets, 2 times a day 3. Senakot 1-2 tablets, 1-2 times a day 4. Glycerin suppository can soften hard stool take once a day 5. Bisacodyl suppository once a day  6. Milk of Magnesia 30 mL 1-2 times a day 7. Fleets or tap water enema    What can I do for nausea?  1. Limit most solid foods for 24-48 hours 2. Continue eating small frequent amounts of liquids and/or bland soft foods ? Toast, crackers, cooked cereal (grits, cream of wheat, rice) 3. Benadryl: a mild anti-nausea medicine can be obtained without a prescription. May cause drowsiness, especially if taken with narcotic pain medicines 4. Contact provider for prescription nausea medication     What can I do, or take for diarrhea (more than five loose stools per day)? 1. Drink  plenty of clear fluids to prevent dehydration 2. May take Kaopectate, Pepto-Bismol, Imodium, or probiotics for 1-2 days 3. Anusol or Preparation-H can be helpful for hemorrhoids and irritated tissue around anus   When should I call the doctor?             CONSTIPATION:   Not relieved after three days following the above program VOMITING:  That contains blood, "coffee ground" material  More the three times/hour and unable to keep down nausea medication for more than eight hours  With dry mouth, dark or strong urine, feeling light-headed, dizzy, or confused  With severe abdominal pain or bloating for more than 24 hours DIARRHEA:  That continues for more then 24-48 hours despite treatment  That contains blood or tarry material  With dry mouth, dark or strong urine, feeling light~headed, dizzy, or confused FEVER:  101 F or higher along with nausea, vomiting, gas pain, diarrhea UNABLE TO:  Pass gas from rectum for more than 24 hours  Tolerate liquids by mouth for more than 24 hours                   Laparoscopy, Care After Refer to this sheet in the next few weeks. These instructions provide you with information about caring for yourself after your procedure. Your health care provider may also give you more specific instructions. Your treatment has been planned according to current medical practices, but problems sometimes occur. Call your health care provider if you have any problems or questions after your procedure. WHAT TO EXPECT AFTER THE PROCEDURE After your procedure, it is common to have mild discomfort in the throat and abdomen. HOME CARE INSTRUCTIONS  Take over-the-counter and prescription medicines only as told by your health care provider.  Do not drive for 24 hours if you   received a sedative.  Return to your normal activities as told by your health care provider.  Do not take baths, swim, or use a hot tub until your health care provider approves. You may  shower.  Follow instructions from your health care provider about how to take care of your incision. Make sure you: ? Wash your hands with soap and water before you change your bandage (dressing). If soap and water are not available, use hand sanitizer. ? Change your dressing as told by your health care provider. ? Leave stitches (sutures), skin glue, or adhesive strips in place. These skin closures may need to stay in place for 2 weeks or longer. If adhesive strip edges start to loosen and curl up, you may trim the loose edges. Do not remove adhesive strips completely unless your health care provider tells you to do that.  Check your incision area every day for signs of infection. Check for: ? More redness, swelling, or pain. ? More fluid or blood. ? Warmth. ? Pus or a bad smell.  It is your responsibility to get the results of your procedure. Ask your health care provider or the department performing the procedure when your results will be ready. SEEK MEDICAL CARE IF:  There is new pain in your shoulders.  You feel light-headed or faint.  You are unable to pass gas or unable to have a bowel movement.  You feel nauseous or you vomit.  You develop a rash.  You have more redness, swelling, or pain around your incision.  You have more fluid or blood coming from your incision.  Your incision feels warm to the touch.  You have pus or a bad smell coming from your incision.  You have a fever or chills. SEEK IMMEDIATE MEDICAL CARE IF:  Your pain is getting worse.  You have ongoing vomiting.  The edges of your incision open up.  You have trouble breathing.  You have chest pain.   This information is not intended to replace advice given to you by your health care provider. Make sure you discuss any questions you have with your health care provider.    Laparoscopic Hysterectomy, Care After Refer to this sheet in the next few weeks. These instructions provide you with  information on caring for yourself after your procedure. Your health care provider may also give you more specific instructions. Your treatment has been planned according to current medical practices, but problems sometimes occur. Call your health care provider if you have any problems or questions after your procedure. What can I expect after the procedure?  Pain and bruising at the incision sites. You will be given pain medicine to control it.  Menopausal symptoms such as hot flashes, night sweats, and insomnia if your ovaries were removed.  Sore throat from the breathing tube that was inserted during surgery. Follow these instructions at home:  Only take over-the-counter or prescription medicines for pain, discomfort, or fever as directed by your health care provider.  Do not take aspirin. It can cause bleeding.  Do not drive when taking pain medicine.  Follow your health care provider's advice regarding diet, exercise, lifting, driving, and general activities.  Resume your usual diet as directed and allowed.  Get plenty of rest and sleep.  Do not douche, use tampons, or have sexual intercourse for at least 6 weeks, or until your health care provider gives you permission.  Change your bandages (dressings) as directed by your health care provider.  Monitor your   temperature and notify your health care provider of a fever.  Take showers instead of baths for 2-3 weeks.  Do not drink alcohol until your health care provider gives you permission.  If you develop constipation, you may take a mild laxative with your health care provider's permission. Bran foods may help with constipation problems. Drinking enough fluids to keep your urine clear or pale yellow may help as well.  Try to have someone home with you for 1-2 weeks to help around the house.  Keep all of your follow-up appointments as directed by your health care provider. Contact a health care provider if:  You have  swelling, redness, or increasing pain around your incision sites.  You have pus coming from your incision.  You notice a bad smell coming from your incision.  Your incision breaks open.  You feel dizzy or lightheaded.  You have pain or bleeding when you urinate.  You have persistent diarrhea.  You have persistent nausea and vomiting.  You have abnormal vaginal discharge.  You have a rash.  You have any type of abnormal reaction or develop an allergy to your medicine.  You have poor pain control with your prescribed medicine. Get help right away if:  You have chest pain or shortness of breath.  You have severe abdominal pain that is not relieved with pain medicine.  You have pain or swelling in your legs. This information is not intended to replace advice given to you by your health care provider. Make sure you discuss any questions you have with your health care provider. Document Released: 05/25/2013 Document Revised: 01/10/2016 Document Reviewed: 02/22/2013 Elsevier Interactive Patient Education  2017 [employer]                    

## 2017-12-18 ENCOUNTER — Telehealth: Payer: Self-pay

## 2017-12-18 NOTE — Telephone Encounter (Signed)
Voicemail left with Ms. Catoe to return call for PAT appointment details. Oncology Nurse Navigator Documentation  Navigator Location: [facility] (12/18/17 1000)   )Navigator Encounter Type: Telephone (12/18/17 1000) Telephone: Kathrin Penner Call;Appt Confirmation/Clarification (12/18/17 1000)                   Patient Visit Type: GynOnc (12/18/17 1000)                              Time Spent with Patient: 15 (12/18/17 1000)

## 2017-12-22 ENCOUNTER — Telehealth: Payer: Self-pay

## 2017-12-22 NOTE — Telephone Encounter (Signed)
Notified Ms. Prather of PAT appointment 5/14 at 0800 in the [facility]. Read back performed. Oncology Nurse Navigator Documentation  Navigator Location: [facility] (12/22/17 0900)   )Navigator Encounter Type: Telephone (12/22/17 0900) Telephone: Lahoma Crocker Call (12/22/17 0900)                   Patient Visit Type: GynOnc (12/22/17 0900)                              Time Spent with Patient: 15 (12/22/17 0900)

## 2017-12-23 ENCOUNTER — Ambulatory Visit (INDEPENDENT_AMBULATORY_CARE_PROVIDER_SITE_OTHER): Payer: Medicare Other | Admitting: Obstetrics & Gynecology

## 2017-12-23 ENCOUNTER — Encounter: Payer: Self-pay | Admitting: Obstetrics & Gynecology

## 2017-12-23 VITALS — BP 140/80 | Ht 60.0 in | Wt 333.0 lb

## 2017-12-23 DIAGNOSIS — N950 Postmenopausal bleeding: Secondary | ICD-10-CM

## 2017-12-23 DIAGNOSIS — N8501 Benign endometrial hyperplasia: Secondary | ICD-10-CM | POA: Diagnosis not present

## 2017-12-23 NOTE — Progress Notes (Signed)
PRE-OPERATIVE HISTORY AND PHYSICAL EXAM  HPI:  Kristina Cruz is a 57 y.o. G2P1010 No LMP recorded.; she is being admitted for surgery related to abnormal uterine bleeding and endometrial hyperplasia.  She has been menopausal for several years. Currently on no HRT.  Pt had D&C and Polypectomy in Nov 2018 with Endometrial Hyperplasia without Atypia identified.  She has not bled since surgery until spotting for 2 days in last Feb and then more red bleeding on an intermitant basis in March and April.  Bleeding is described as less flow than a normal period and has occurred several times.  Currently some bleeding now.  Has used Provera at times to help w bleeding, but recurs.  Has seen Gyn Onc as well due to dx and co-morbidities.  Plan surgery together to assess and treat.   PMHx: Past Medical History:  Diagnosis Date  . Anemia   . Back pain   . Diabetes mellitus without complication (HCC)   . Hypertension   . Restless leg syndrome    Past Surgical History:  Procedure Laterality Date  . HYSTEROSCOPY W/D&C N/A 07/14/2017   Procedure: DILATATION AND CURETTAGE /HYSTEROSCOPY;  Surgeon: Nadara Mustard, MD;  Location: [facility] ORS;  Service: Gynecology;  Laterality: N/A;  . POLYPECTOMY  2015   Family History  Problem Relation Age of Onset  . Breast cancer Mother 79  . Diabetes Mother   . Hypertension Mother   . Ovarian cancer Paternal Aunt        ?  . Diabetes Father   . Hypertension Father    Social History   Tobacco Use  . Smoking status: Never Smoker  . Smokeless tobacco: Never Used  Substance Use Topics  . Alcohol use: No  . Drug use: No    Current Outpatient Medications:  .  albuterol (PROVENTIL HFA;VENTOLIN HFA) 108 (90 BASE) MCG/ACT inhaler, Inhale 2 puffs into the lungs every 4 (four) hours as needed for wheezing., Disp: , Rfl:  .  ferrous gluconate (IRON 27) 240 (27 FE) MG tablet, Take 1 tablet (240 mg total) by mouth 3 (three) times daily with meals. Take every other  day, Disp: 90 tablet, Rfl: 0 .  furosemide (LASIX) 20 MG tablet, Take 1 tablet (20 mg total) by mouth daily., Disp: 2 tablet, Rfl: 0 .  hydrochlorothiazide (HYDRODIURIL) 25 MG tablet, Take 25 mg by mouth at bedtime., Disp: , Rfl:  .  losartan (COZAAR) 100 MG tablet, , Disp: , Rfl:  .  medroxyPROGESTERone (PROVERA) 10 MG tablet, Take 2 tablets (20 mg total) by mouth daily., Disp: 30 tablet, Rfl: 2 .  metFORMIN (GLUCOPHAGE) 500 MG tablet, Take 500 mg daily with breakfast by mouth. , Disp: , Rfl:  .  methocarbamol (ROBAXIN-750) 750 MG tablet, Take 1 tablet (750 mg total) by mouth 4 (four) times daily. (Patient not taking: Reported on 12/16/2017), Disp: 20 tablet, Rfl: 0 .  methylPREDNISolone (MEDROL DOSEPAK) 4 MG TBPK tablet, Take Tapered dose as directed (Patient not taking: Reported on 12/16/2017), Disp: 21 tablet, Rfl: 0 .  oxyCODONE-acetaminophen (PERCOCET) 5-325 MG tablet, Take 1 tablet by mouth every 4 (four) hours as needed for moderate pain or severe pain. (Patient not taking: Reported on 11/11/2017), Disp: 42 tablet, Rfl: 0 .  oxyCODONE-acetaminophen (PERCOCET/ROXICET) 5-325 MG tablet, Take 1 tablet by mouth every 4 (four) hours as needed for severe pain. (Patient not taking: Reported on 07/06/2017), Disp: 20 tablet, Rfl: 0 .  predniSONE (DELTASONE) 20 MG tablet, Take 3  tablets (60 mg total) by mouth daily with breakfast. (Patient not taking: Reported on 07/06/2017), Disp: 12 tablet, Rfl: 0 .  ranitidine (ZANTAC) 150 MG tablet, Take 150 mg by mouth every morning., Disp: , Rfl:  .  rOPINIRole (REQUIP) 0.25 MG tablet, Take 1 mg by mouth at bedtime., Disp: , Rfl:  .  rOPINIRole (REQUIP) 2 MG tablet, Take 2 mg at bedtime by mouth., Disp: , Rfl:  .  traMADol (ULTRAM) 50 MG tablet, Take 1 tablet (50 mg total) by mouth every 6 (six) hours as needed. (Patient not taking: Reported on 12/16/2017), Disp: 20 tablet, Rfl: 0 Allergies: Contrast media [iodinated diagnostic agents] and Other  Review of Systems    Constitutional: Negative for chills, fever and malaise/fatigue.  HENT: Negative for congestion, sinus pain and sore throat.   Eyes: Negative for blurred vision and pain.  Respiratory: Negative for cough and wheezing.   Cardiovascular: Negative for chest pain and leg swelling.  Gastrointestinal: Negative for abdominal pain, constipation, diarrhea, heartburn, nausea and vomiting.  Genitourinary: Negative for dysuria, frequency, hematuria and urgency.  Musculoskeletal: Negative for back pain, joint pain, myalgias and neck pain.  Skin: Negative for itching and rash.  Neurological: Negative for dizziness, tremors and weakness.  Endo/Heme/Allergies: Does not bruise/bleed easily.  Psychiatric/Behavioral: Negative for depression. The patient is not nervous/anxious and does not have insomnia.    Objective: BP 140/80   Ht 5' (1.524 m)   Wt (!) 333 lb (151 kg)   BMI 65.03 kg/m   Filed Weights   12/23/17 1005  Weight: (!) 333 lb (151 kg)   Physical Exam  Constitutional: She is oriented to person, place, and time. She appears well-developed and well-nourished. No distress.  HENT:  Head: Normocephalic and atraumatic. Head is without laceration.  Right Ear: Hearing normal.  Left Ear: Hearing normal.  Nose: No epistaxis.  No foreign bodies.  Mouth/Throat: Uvula is midline, oropharynx is clear and moist and mucous membranes are normal.  Eyes: Pupils are equal, round, and reactive to light.  Neck: Normal range of motion. Neck supple. No thyromegaly present.  Cardiovascular: Normal rate and regular rhythm. Exam reveals no gallop and no friction rub.  No murmur heard. Pulmonary/Chest: Effort normal and breath sounds normal. No respiratory distress. She has no wheezes. Right breast exhibits no mass, no skin change and no tenderness. Left breast exhibits no mass, no skin change and no tenderness.  Abdominal: Soft. Bowel sounds are normal. She exhibits no distension. There is no tenderness. There is  no rebound.  Musculoskeletal: Normal range of motion.  Neurological: She is alert and oriented to person, place, and time. No cranial nerve deficit.  Skin: Skin is warm and dry.  Psychiatric: She has a normal mood and affect. Judgment normal.  Vitals reviewed.  Assessment: 1. Endometrial hyperplasia without atypia, simple   2. Post-menopausal bleeding   Plans hysterectomy; alternative treatment options and expectant manage,ent discussed. Risks of obesity and co-morbidities on surgery also discussed.  I have had a careful discussion with this patient about all the options available and the risk/benefits of each. I have fully informed this patient that surgery may subject her to a variety of discomforts and risks: She understands that most patients have surgery with little difficulty, but problems can happen ranging from minor to fatal. These include nausea, vomiting, pain, bleeding, infection, poor healing, hernia, or formation of adhesions. Unexpected reactions may occur from any drug or anesthetic given. Unintended injury may occur to other pelvic or abdominal  structures such as Fallopian tubes, ovaries, bladder, ureter (tube from kidney to bladder), or bowel. Nerves going from the pelvis to the legs may be injured. Any such injury may require immediate or later additional surgery to correct the problem. Excessive blood loss requiring transfusion is very unlikely but possible. Dangerous blood clots may form in the legs or lungs. Physical and sexual activity will be restricted in varying degrees for an indeterminate period of time but most often 2-6 weeks.  Finally, she understands that it is impossible to list every possible undesirable effect and that the condition for which surgery is done is not always cured or significantly improved, and in rare cases may be even worse.Ample time was given to answer all questions.  Annamarie Major, MD, [facility], [facility] 12/23/2017   10:55 AM

## 2017-12-23 NOTE — Patient Instructions (Signed)

## 2017-12-29 ENCOUNTER — Encounter
Admission: RE | Admit: 2017-12-29 | Discharge: 2017-12-29 | Disposition: A | Discharge status: Home / Self Care | Payer: Medicare Other | Source: Ambulatory Visit | Attending: Obstetrics and Gynecology | Admitting: Obstetrics and Gynecology

## 2017-12-29 ENCOUNTER — Other Ambulatory Visit: Payer: Self-pay

## 2017-12-29 DIAGNOSIS — I10 Essential (primary) hypertension: Secondary | ICD-10-CM | POA: Diagnosis not present

## 2017-12-29 DIAGNOSIS — E119 Type 2 diabetes mellitus without complications: Secondary | ICD-10-CM | POA: Insufficient documentation

## 2017-12-29 DIAGNOSIS — Z0183 Encounter for blood typing: Secondary | ICD-10-CM | POA: Insufficient documentation

## 2017-12-29 DIAGNOSIS — Z01812 Encounter for preprocedural laboratory examination: Secondary | ICD-10-CM | POA: Insufficient documentation

## 2017-12-29 DIAGNOSIS — R9431 Abnormal electrocardiogram [ECG] [EKG]: Secondary | ICD-10-CM | POA: Insufficient documentation

## 2017-12-29 DIAGNOSIS — Z01818 Encounter for other preprocedural examination: Secondary | ICD-10-CM | POA: Diagnosis not present

## 2017-12-29 HISTORY — DX: Gastro-esophageal reflux disease without esophagitis: K21.9

## 2017-12-29 LAB — CBC WITH DIFFERENTIAL/PLATELET
Basophils Absolute: 0.1 10*3/uL (ref 0–0.1)
Basophils Relative: 1 %
EOS ABS: 0.5 10*3/uL (ref 0–0.7)
EOS PCT: 5 %
HCT: 28.2 % — ABNORMAL LOW (ref 35.0–47.0)
Hemoglobin: 9 g/dL — ABNORMAL LOW (ref 12.0–16.0)
LYMPHS ABS: 2.5 10*3/uL (ref 1.0–3.6)
LYMPHS PCT: 25 %
MCH: 25.5 pg — AB (ref 26.0–34.0)
MCHC: 32 g/dL (ref 32.0–36.0)
MCV: 79.6 fL — AB (ref 80.0–100.0)
MONO ABS: 0.6 10*3/uL (ref 0.2–0.9)
Monocytes Relative: 6 %
Neutro Abs: 6.1 10*3/uL (ref 1.4–6.5)
Neutrophils Relative %: 63 %
Platelets: 638 10*3/uL — ABNORMAL HIGH (ref 150–440)
RBC: 3.55 MIL/uL — ABNORMAL LOW (ref 3.80–5.20)
RDW: 17.5 % — AB (ref 11.5–14.5)
WBC: 9.6 10*3/uL (ref 3.6–11.0)

## 2017-12-29 LAB — COMPREHENSIVE METABOLIC PANEL
ALT: 11 U/L — ABNORMAL LOW (ref 14–54)
ANION GAP: 7 (ref 5–15)
AST: 10 U/L — ABNORMAL LOW (ref 15–41)
Albumin: 3.8 g/dL (ref 3.5–5.0)
Alkaline Phosphatase: 78 U/L (ref 38–126)
BUN: 17 mg/dL (ref 6–20)
CHLORIDE: 107 mmol/L (ref 101–111)
CO2: 26 mmol/L (ref 22–32)
CREATININE: 0.91 mg/dL (ref 0.44–1.00)
Calcium: 8.7 mg/dL — ABNORMAL LOW (ref 8.9–10.3)
Glucose, Bld: 134 mg/dL — ABNORMAL HIGH (ref 65–99)
POTASSIUM: 3.6 mmol/L (ref 3.5–5.1)
SODIUM: 140 mmol/L (ref 135–145)
Total Bilirubin: 0.3 mg/dL (ref 0.3–1.2)
Total Protein: 7.9 g/dL (ref 6.5–8.1)

## 2017-12-29 LAB — PROTIME-INR
INR: 1.1
PROTHROMBIN TIME: 14.1 s (ref 11.4–15.2)

## 2017-12-29 LAB — TYPE AND SCREEN
ABO/RH(D): O POS
Antibody Screen: NEGATIVE

## 2017-12-29 LAB — APTT: aPTT: 33 seconds (ref 24–36)

## 2017-12-29 LAB — HEMOGLOBIN A1C
HEMOGLOBIN A1C: 6 % — AB (ref 4.8–5.6)
MEAN PLASMA GLUCOSE: 125.5 mg/dL

## 2017-12-29 NOTE — Patient Instructions (Signed)
  Your procedure is scheduled on: Wednesday Jan 06, 2018 Report to Same Day Surgery 2nd floor medical mall (Medical Mall Entrance-take elevator on left to 2nd floor.  Check in with surgery information desk.) To find out your arrival time please call [phone] between 1PM - 3PM on Tuesday Jan 05, 2018  Remember: Instructions that are not followed completely may result in serious medical risk, up to and including death, or upon the discretion of your surgeon and anesthesiologist your surgery may need to be rescheduled.    _x___ 1. Do not eat food after midnight the night before your procedure. You may drink water up to 2 hours before you are scheduled to arrive at the hospital for your procedure.  Do not drink anything within 2 hours of your scheduled arrival to the hospital.  No gum chewing or hard candies.      __x__ 2. No Alcohol for 24 hours before or after surgery.   __x__3. No Smoking or e-cigarettes for 24 prior to surgery.  Do not use any chewable tobacco products for at least 6 hour prior to surgery   ____  4. Bring all medications with you on the day of surgery if instructed.    __x__ 5. Notify your doctor if there is any change in your medical condition     (cold, fever, infections).   __x__6. On the morning of surgery brush your teeth with toothpaste and water.  You may rinse your mouth with mouth wash if you wish.  Do not swallow any toothpaste or mouthwash.   Do not wear jewelry, make-up, hairpins, clips or nail polish.  Do not wear lotions, powders, deodorant, or perfumes.   Do not shave 48 hours prior to surgery.   Do not bring valuables to the hospital.    [facility] is not responsible for any belongings or valuables.               Contacts, dentures or bridgework may not be worn into surgery.  Leave your suitcase in the car. After surgery it may be brought to your room.  For patients admitted to the hospital, discharge time is determined by your treatment  team.  Please read over the following fact sheets that you were given:   [facility] Preparing for Surgery and or MRSA Information   _x___ Take anti-hypertensive listed below, cardiac, seizure, asthma, anti-reflux and psychiatric medicines. These include:  1. Ropinirole/Requip  2. Ranitidine/Zantac  _x___Fleets enema or Magnesium Citrate as directed.   _x___ Use CHG Soap or sage wipes as directed on instruction sheet   _x___ Stop Metformin 2 days prior to surgery, Monday Jan 04, 2018.    _x___ Follow recommendations from Cardiologist, Pulmonologist or PCP regarding stopping Aspirin, Coumadin, Plavix ,Eliquis, Effient, or Pradaxa, and Pletal.  _x___Stop Anti-inflammatories such as Advil, Aleve, Ibuprofen, Motrin, Naproxen, Naprosyn, Goodies powders or aspirin products. OK to take Tylenol and Celebrex.   _x___ Stop supplements until after surgery.  But may continue Vitamin D, Vitamin B, and multivitamin.

## 2017-12-30 NOTE — Pre-Procedure Instructions (Signed)
AS INSTRUCTED BY DR Ether Griffins, EKG / REQUEST FOR CLEARANCE CALLED AND FAXED TO VALERIE AT DR REVELO. ALSO FAXED  TO DR Johnnette Litter

## 2017-12-31 DIAGNOSIS — R0602 Shortness of breath: Secondary | ICD-10-CM | POA: Insufficient documentation

## 2017-12-31 DIAGNOSIS — Z01810 Encounter for preprocedural cardiovascular examination: Secondary | ICD-10-CM | POA: Insufficient documentation

## 2017-12-31 DIAGNOSIS — R9431 Abnormal electrocardiogram [ECG] [EKG]: Secondary | ICD-10-CM | POA: Insufficient documentation

## 2017-12-31 DIAGNOSIS — R079 Chest pain, unspecified: Secondary | ICD-10-CM | POA: Insufficient documentation

## 2017-12-31 NOTE — Pre-Procedure Instructions (Signed)
PCP REFERRED TO CARDIOLOGY FOR CLEARANCE.

## 2018-01-01 ENCOUNTER — Other Ambulatory Visit: Payer: Self-pay | Admitting: Cardiology

## 2018-01-01 DIAGNOSIS — Z01810 Encounter for preprocedural cardiovascular examination: Secondary | ICD-10-CM

## 2018-01-04 ENCOUNTER — Encounter
Admission: RE | Admit: 2018-01-04 | Discharge: 2018-01-04 | Disposition: A | Discharge status: Home / Self Care | Payer: Medicare Other | Source: Ambulatory Visit | Attending: Cardiology | Admitting: Cardiology

## 2018-01-04 ENCOUNTER — Telehealth: Payer: Self-pay

## 2018-01-04 DIAGNOSIS — Z01810 Encounter for preprocedural cardiovascular examination: Secondary | ICD-10-CM | POA: Diagnosis present

## 2018-01-04 MED ORDER — REGADENOSON 0.4 MG/5ML IV SOLN
0.4000 mg | Freq: Once | INTRAVENOUS | Status: AC
  Administered 2018-01-04: 0.4 mg via INTRAVENOUS

## 2018-01-04 MED ORDER — TECHNETIUM TC 99M TETROFOSMIN IV KIT
30.0000 | PACK | Freq: Once | INTRAVENOUS | Status: AC | PRN
  Administered 2018-01-04: 30.339 via INTRAVENOUS

## 2018-01-04 NOTE — Telephone Encounter (Signed)
Called and spoke with Ms. Kristina Cruz. Notified taht we would like to obtain some additional blood work due to her low HCT on pre-admit testing. She informed me that she was sent to cardiology for clearance and is having a myoview today and tomorrow. Called and spoke to Dr. Jasmine December RN and she is not sure it will be read by Wednesday am. She will try and get the on call physcian to read once completed. I have spoken with Kristina Cruz at [facility] and she notified Kristina Cruz of potential for surgery to be cancelled. Kristina Cruz notified as well. Kristina Cruz was informed to go to part two of her study as scheduled in the am. We will make a decision regarding surgery tomorrow and notify her. She verbalized understanding. Oncology Nurse Navigator Documentation  Navigator Location: [facility] (01/04/18 1500)   )Navigator Encounter Type: Telephone (01/04/18 1500) Telephone: Outgoing Call (01/04/18 1500)                   Patient Visit Type: GynOnc (01/04/18 1500)                              Time Spent with Patient: 30 (01/04/18 1500)

## 2018-01-05 ENCOUNTER — Ambulatory Visit
Admission: RE | Admit: 2018-01-05 | Discharge: 2018-01-05 | Disposition: A | Discharge status: Home / Self Care | Payer: Medicare Other | Source: Ambulatory Visit | Attending: Cardiology | Admitting: Cardiology

## 2018-01-05 ENCOUNTER — Telehealth: Payer: Self-pay

## 2018-01-05 DIAGNOSIS — Z01810 Encounter for preprocedural cardiovascular examination: Secondary | ICD-10-CM | POA: Insufficient documentation

## 2018-01-05 LAB — NM MYOCAR MULTI W/SPECT W/WALL MOTION / EF
CHL CUP NUCLEAR SSS: 17
CHL CUP RESTING HR STRESS: 70 {beats}/min
CSEPED: 1 min
CSEPEDS: 0 s
CSEPHR: 63 %
Estimated workload: 1 METS
LV sys vol: 30 mL
LVDIAVOL: 68 mL (ref 46–106)
MPHR: 161 {beats}/min
Peak HR: 102 {beats}/min
SDS: 11
SRS: 6
TID: 1.17

## 2018-01-05 MED ORDER — TECHNETIUM TC 99M TETROFOSMIN IV KIT
31.3430 | PACK | Freq: Once | INTRAVENOUS | Status: AC | PRN
  Administered 2018-01-05: 31.343 via INTRAVENOUS

## 2018-01-05 NOTE — Pre-Procedure Instructions (Addendum)
STRESS TEST 01/04/18. SPOKE WITH Kristina Cruz AT DR PARASCHOS ABOUT CLEARANCE. SPOKE WITH KRISTI AT DR BERCHUCK'S. PATIENT CNL FOR 01/06/18 SINCE FINAL CARDIAC CLEARANCE NOT RECEIVED YET. HAVING SECOND PART OF STRESS TEST TODAY

## 2018-01-05 NOTE — Telephone Encounter (Signed)
Spoke with Dr. Johnnette Litter regarding surgery. Results are pending from Myoview. At this time we will cancel surgery until clearance is received. Dr. Tiburcio Pea notified of cancellation. OR notified of cancellation. Spoke with Ms. Duffy and educated that until all her results are back and she has received clearance that we are cancelling surgery. She likely will need to have surgery at [facility] per Dr. Johnnette Litter and Tiburcio Pea. We will call her once the decision regarding surgery has been determined.  Oncology Nurse Navigator Documentation  Navigator Location: [facility] (01/05/18 1300)   )Navigator Encounter Type: Telephone (01/05/18 1300) Telephone: Kathrin Penner Call;Incoming Call;Patient Update (01/05/18 1300)                   Patient Visit Type: GynOnc (01/05/18 1300)                              Time Spent with Patient: 30 (01/05/18 1300)

## 2018-01-06 ENCOUNTER — Telehealth: Payer: Self-pay

## 2018-01-06 ENCOUNTER — Other Ambulatory Visit: Payer: Self-pay | Admitting: Obstetrics & Gynecology

## 2018-01-06 ENCOUNTER — Encounter: Admission: RE | Payer: Self-pay | Source: Ambulatory Visit

## 2018-01-06 ENCOUNTER — Ambulatory Visit
Admission: RE | Admit: 2018-01-06 | Payer: Medicare Other | Source: Ambulatory Visit | Admitting: Obstetrics and Gynecology

## 2018-01-06 SURGERY — HYSTERECTOMY, TOTAL, LAPAROSCOPIC
Anesthesia: Choice

## 2018-01-06 NOTE — Telephone Encounter (Signed)
Notified Ms. Stadel that Dr. Johnnette Litter would prefer to perform her surgery at [facility] due to high BMI and risk factors. She verbalized understanding. Referral will be sent to [facility] to have her surgery arranged. They will contact her with appointments. Oncology Nurse Navigator Documentation  Navigator Location: [facility] (01/06/18 1200)   )Navigator Encounter Type: Telephone (01/06/18 1200)                     Patient Visit Type: GynOnc (01/06/18 1200)                              Time Spent with Patient: 15 (01/06/18 1200)

## 2018-02-08 ENCOUNTER — Telehealth: Payer: Self-pay

## 2018-02-08 DIAGNOSIS — E119 Type 2 diabetes mellitus without complications: Secondary | ICD-10-CM | POA: Insufficient documentation

## 2018-02-08 DIAGNOSIS — N950 Postmenopausal bleeding: Secondary | ICD-10-CM

## 2018-02-08 DIAGNOSIS — E1165 Type 2 diabetes mellitus with hyperglycemia: Secondary | ICD-10-CM | POA: Insufficient documentation

## 2018-02-08 DIAGNOSIS — N8501 Benign endometrial hyperplasia: Secondary | ICD-10-CM

## 2018-02-08 NOTE — Telephone Encounter (Signed)
Received request from Dr. Johnnette LitterBerchuck for blood transfusion to correct Hgb of 6 prior to surgery 6/27. Spoke with Ms. Laughridge and she will come 6/25 at 0900 for labs and blood transfusion 6-26. Oncology Nurse Navigator Documentation  Navigator Location: [facility] (02/08/18 1700)   )Navigator Encounter Type: Telephone (02/08/18 1700) Telephone: Kathrin Pennerutgoing Call;Incoming Call (02/08/18 1700)                                                  Time Spent with Patient: 30 (02/08/18 1700)

## 2018-02-09 ENCOUNTER — Inpatient Hospital Stay: Payer: Medicare Other | Attending: Obstetrics and Gynecology

## 2018-02-09 ENCOUNTER — Other Ambulatory Visit: Payer: Self-pay | Admitting: Nurse Practitioner

## 2018-02-09 DIAGNOSIS — N950 Postmenopausal bleeding: Secondary | ICD-10-CM | POA: Diagnosis not present

## 2018-02-09 DIAGNOSIS — N8501 Benign endometrial hyperplasia: Secondary | ICD-10-CM | POA: Diagnosis present

## 2018-02-09 DIAGNOSIS — D500 Iron deficiency anemia secondary to blood loss (chronic): Secondary | ICD-10-CM

## 2018-02-09 LAB — CBC WITH DIFFERENTIAL/PLATELET
Basophils Absolute: 0.1 10*3/uL (ref 0–0.1)
Basophils Relative: 1 %
EOS PCT: 4 %
Eosinophils Absolute: 0.4 10*3/uL (ref 0–0.7)
HCT: 21.6 % — ABNORMAL LOW (ref 35.0–47.0)
Hemoglobin: 6.7 g/dL — ABNORMAL LOW (ref 12.0–16.0)
LYMPHS ABS: 2.6 10*3/uL (ref 1.0–3.6)
LYMPHS PCT: 27 %
MCH: 22.3 pg — AB (ref 26.0–34.0)
MCHC: 31.1 g/dL — AB (ref 32.0–36.0)
MCV: 71.6 fL — AB (ref 80.0–100.0)
MONO ABS: 0.6 10*3/uL (ref 0.2–0.9)
MONOS PCT: 6 %
Neutro Abs: 5.8 10*3/uL (ref 1.4–6.5)
Neutrophils Relative %: 62 %
PLATELETS: 546 10*3/uL — AB (ref 150–440)
RBC: 3.02 MIL/uL — ABNORMAL LOW (ref 3.80–5.20)
RDW: 20.7 % — ABNORMAL HIGH (ref 11.5–14.5)
WBC: 9.4 10*3/uL (ref 3.6–11.0)

## 2018-02-09 LAB — PREPARE RBC (CROSSMATCH)

## 2018-02-09 NOTE — Progress Notes (Signed)
Blood orders entered and 'prepare rbc' order released.

## 2018-02-09 NOTE — Progress Notes (Signed)
Ms. Kristina Cruz is currently having labs drawn for CBC, T&S. She will return tomorrow at 0900 for blood transfusion under the direction of Lauren Allen Np.She verbalized understanding of appointments. Oncology Nurse Navigator Documentation  Navigator Location: [facility] (02/09/18 0800)   )Navigator Encounter Type: Other(Lab) (02/09/18 0800) Telephone: Appt Confirmation/Clarification (02/09/18 0800)                                                  Time Spent with Patient: 15 (02/09/18 0800)

## 2018-02-10 ENCOUNTER — Inpatient Hospital Stay: Payer: Medicare Other

## 2018-02-10 DIAGNOSIS — D500 Iron deficiency anemia secondary to blood loss (chronic): Secondary | ICD-10-CM

## 2018-02-10 DIAGNOSIS — N8501 Benign endometrial hyperplasia: Secondary | ICD-10-CM | POA: Diagnosis not present

## 2018-02-10 MED ORDER — SODIUM CHLORIDE 0.9 % IV SOLN
250.0000 mL | Freq: Once | INTRAVENOUS | Status: AC
  Administered 2018-02-10: 250 mL via INTRAVENOUS
  Filled 2018-02-10: qty 250

## 2018-02-10 MED ORDER — DIPHENHYDRAMINE HCL 25 MG PO CAPS
25.0000 mg | ORAL_CAPSULE | Freq: Once | ORAL | Status: AC
  Administered 2018-02-10: 25 mg via ORAL
  Filled 2018-02-10: qty 1

## 2018-02-10 MED ORDER — ACETAMINOPHEN 325 MG PO TABS
650.0000 mg | ORAL_TABLET | Freq: Once | ORAL | Status: AC
  Administered 2018-02-10: 650 mg via ORAL
  Filled 2018-02-10: qty 2

## 2018-02-11 LAB — BPAM RBC
BLOOD PRODUCT EXPIRATION DATE: 201907252359
Blood Product Expiration Date: 201907252359
ISSUE DATE / TIME: 201906260925
ISSUE DATE / TIME: 201906261147
UNIT TYPE AND RH: 5100
Unit Type and Rh: 5100

## 2018-02-11 LAB — TYPE AND SCREEN
ABO/RH(D): O POS
Antibody Screen: NEGATIVE
UNIT DIVISION: 0
Unit division: 0

## 2018-03-15 ENCOUNTER — Telehealth: Payer: Self-pay

## 2018-03-15 NOTE — Telephone Encounter (Signed)
Called and notified Ms. Iacovelli with post op appointment 9/11 at 1030 with Dr. Johnnette LitterBerchuck. Read back performed. Oncology Nurse Navigator Documentation  Navigator Location: [facility] (03/15/18 1500)   )Navigator Encounter Type: Letter/Fax/Email;Telephone (03/15/18 1500)                     Patient Visit Type: GynOnc (03/15/18 1500)                              Time Spent with Patient: 15 (03/15/18 1500)

## 2018-04-28 ENCOUNTER — Inpatient Hospital Stay: Payer: Medicare Other | Attending: Obstetrics and Gynecology

## 2018-05-19 ENCOUNTER — Inpatient Hospital Stay: Payer: Medicare Other | Attending: Obstetrics and Gynecology

## 2018-05-21 ENCOUNTER — Telehealth: Payer: Self-pay

## 2018-05-21 NOTE — Telephone Encounter (Signed)
Kristina Cruz did not show for her post operative appointment 10/2 with Dr. Johnnette Litter. This is her second missed appointment. Voicemail left for her to return call to reschedule. Oncology Nurse Navigator Documentation  Navigator Location: [facility] (05/21/18 1000)   )Navigator Encounter Type: Telephone (05/21/18 1000) Telephone: Outgoing Call (05/21/18 1000)                                                  Time Spent with Patient: 15 (05/21/18 1000)

## 2018-06-01 ENCOUNTER — Telehealth: Payer: Self-pay

## 2018-06-01 NOTE — Telephone Encounter (Signed)
Called Kristina Cruz because she did not show for her post operative appointment with Dr. Johnnette Litter. She has been rescheduled for 10/30 at 1530. She confirmed appointment verbally with read back. Oncology Nurse Navigator Documentation  Navigator Location: [facility] (06/01/18 1500)   )Navigator Encounter Type: Telephone (06/01/18 1500) Telephone: Kathrin Penner Call;Appt Confirmation/Clarification (06/01/18 1500)                                                  Time Spent with Patient: 15 (06/01/18 1500)

## 2018-06-16 ENCOUNTER — Inpatient Hospital Stay: Payer: Medicare Other

## 2018-06-28 ENCOUNTER — Ambulatory Visit: Payer: Medicare Other | Admitting: Podiatry

## 2018-06-30 ENCOUNTER — Inpatient Hospital Stay: Payer: Medicare Other | Attending: Obstetrics and Gynecology

## 2018-07-06 ENCOUNTER — Telehealth: Payer: Self-pay

## 2018-07-06 NOTE — Telephone Encounter (Signed)
Ms. Kristina Cruz has not shown for any of her post operative appointments. She has been called and given reminders of these appointments and she always states she forgets. I have stressed the importance of follow up and she verbalizes understanding. She had surgery 03/09/18 and has not followed up. I have left her 2 voice mails to see if she would like to be rescheduled for a 5th time. Oncology Nurse Navigator Documentation  Navigator Location: [facility] (07/06/18 1500)   )Navigator Encounter Type: Telephone (07/06/18 1500) Telephone: Outgoing Call (07/06/18 1500)                                                  Time Spent with Patient: 15 (07/06/18 1500)

## 2018-09-03 ENCOUNTER — Encounter: Payer: Self-pay | Admitting: Emergency Medicine

## 2018-09-03 ENCOUNTER — Emergency Department
Admission: EM | Admit: 2018-09-03 | Discharge: 2018-09-04 | Disposition: A | Discharge status: Home / Self Care | Payer: Medicare Other | Attending: Emergency Medicine | Admitting: Emergency Medicine

## 2018-09-03 ENCOUNTER — Other Ambulatory Visit: Payer: Self-pay

## 2018-09-03 DIAGNOSIS — I10 Essential (primary) hypertension: Secondary | ICD-10-CM | POA: Insufficient documentation

## 2018-09-03 DIAGNOSIS — Z79899 Other long term (current) drug therapy: Secondary | ICD-10-CM | POA: Insufficient documentation

## 2018-09-03 DIAGNOSIS — T783XXA Angioneurotic edema, initial encounter: Secondary | ICD-10-CM | POA: Diagnosis not present

## 2018-09-03 DIAGNOSIS — Z7984 Long term (current) use of oral hypoglycemic drugs: Secondary | ICD-10-CM | POA: Diagnosis not present

## 2018-09-03 DIAGNOSIS — E119 Type 2 diabetes mellitus without complications: Secondary | ICD-10-CM | POA: Insufficient documentation

## 2018-09-03 DIAGNOSIS — R220 Localized swelling, mass and lump, head: Secondary | ICD-10-CM | POA: Diagnosis present

## 2018-09-03 LAB — BASIC METABOLIC PANEL
Anion gap: 6 (ref 5–15)
BUN: 16 mg/dL (ref 6–20)
CO2: 28 mmol/L (ref 22–32)
Calcium: 8.8 mg/dL — ABNORMAL LOW (ref 8.9–10.3)
Chloride: 106 mmol/L (ref 98–111)
Creatinine, Ser: 0.86 mg/dL (ref 0.44–1.00)
GFR calc Af Amer: >60 mL/min (ref >60)
GFR calc non Af Amer: >60 mL/min (ref >60)
Glucose, Bld: 95 mg/dL (ref 70–99)
Potassium: 3.5 mmol/L (ref 3.5–5.1)
Sodium: 140 mmol/L (ref 135–145)

## 2018-09-03 LAB — CBC WITH DIFFERENTIAL/PLATELET
Abs Immature Granulocytes: 0.02 10*3/uL (ref 0.00–0.07)
Basophils Absolute: 0 10*3/uL (ref 0.0–0.1)
Basophils Relative: 0 %
Eosinophils Absolute: 0.2 10*3/uL (ref 0.0–0.5)
Eosinophils Relative: 3 %
HEMATOCRIT: 38.7 % (ref 36.0–46.0)
Hemoglobin: 12 g/dL (ref 12.0–15.0)
Immature Granulocytes: 0 %
Lymphocytes Relative: 30 %
Lymphs Abs: 2 10*3/uL (ref 0.7–4.0)
MCH: 24.6 pg — ABNORMAL LOW (ref 26.0–34.0)
MCHC: 31 g/dL (ref 30.0–36.0)
MCV: 79.5 fL — ABNORMAL LOW (ref 80.0–100.0)
Monocytes Absolute: 0.7 10*3/uL (ref 0.1–1.0)
Monocytes Relative: 10 %
Neutro Abs: 3.9 10*3/uL (ref 1.7–7.7)
Neutrophils Relative %: 57 %
Platelets: 492 10*3/uL — ABNORMAL HIGH (ref 150–400)
RBC: 4.87 MIL/uL (ref 3.87–5.11)
RDW: 21.1 % — ABNORMAL HIGH (ref 11.5–15.5)
WBC: 6.9 10*3/uL (ref 4.0–10.5)
nRBC: 0 % (ref 0.0–0.2)

## 2018-09-03 LAB — CBC
HEMATOCRIT: 38.7 % (ref 36.0–46.0)
Hemoglobin: 11.9 g/dL — ABNORMAL LOW (ref 12.0–15.0)
MCH: 24.2 pg — ABNORMAL LOW (ref 26.0–34.0)
MCHC: 30.7 g/dL (ref 30.0–36.0)
MCV: 78.8 fL — ABNORMAL LOW (ref 80.0–100.0)
Platelets: 461 10*3/uL — ABNORMAL HIGH (ref 150–400)
RBC: 4.91 MIL/uL (ref 3.87–5.11)
RDW: 21.1 % — ABNORMAL HIGH (ref 11.5–15.5)
WBC: 7.2 10*3/uL (ref 4.0–10.5)
nRBC: 0 % (ref 0.0–0.2)

## 2018-09-03 MED ORDER — METHYLPREDNISOLONE SODIUM SUCC 125 MG IJ SOLR
125.0000 mg | Freq: Once | INTRAMUSCULAR | Status: AC
  Administered 2018-09-03: 125 mg via INTRAVENOUS

## 2018-09-03 MED ORDER — EPINEPHRINE 0.3 MG/0.3ML IJ SOAJ
INTRAMUSCULAR | Status: AC
  Administered 2018-09-03: 0.3 mg via INTRAMUSCULAR
  Filled 2018-09-03: qty 0.3

## 2018-09-03 MED ORDER — KETOROLAC TROMETHAMINE 30 MG/ML IJ SOLN
15.0000 mg | Freq: Once | INTRAMUSCULAR | Status: AC
  Administered 2018-09-03: 15 mg via INTRAVENOUS
  Filled 2018-09-03: qty 1

## 2018-09-03 MED ORDER — METHYLPREDNISOLONE SODIUM SUCC 125 MG IJ SOLR
125.0000 mg | Freq: Once | INTRAMUSCULAR | Status: AC
  Administered 2018-09-03: 125 mg via INTRAVENOUS
  Filled 2018-09-03: qty 2

## 2018-09-03 MED ORDER — FAMOTIDINE IN NACL 20-0.9 MG/50ML-% IV SOLN
20.0000 mg | Freq: Once | INTRAVENOUS | Status: AC
Start: 2018-09-03 — End: 2018-09-03
  Administered 2018-09-03: 20 mg via INTRAVENOUS
  Filled 2018-09-03: qty 50

## 2018-09-03 MED ORDER — DIPHENHYDRAMINE HCL 50 MG/ML IJ SOLN
50.0000 mg | Freq: Once | INTRAMUSCULAR | Status: AC
  Administered 2018-09-03: 50 mg via INTRAVENOUS

## 2018-09-03 MED ORDER — DIPHENHYDRAMINE HCL 50 MG/ML IJ SOLN
INTRAMUSCULAR | Status: AC
  Administered 2018-09-03: 50 mg via INTRAVENOUS
  Filled 2018-09-03: qty 1

## 2018-09-03 MED ORDER — METHYLPREDNISOLONE SODIUM SUCC 125 MG IJ SOLR
INTRAMUSCULAR | Status: AC
  Administered 2018-09-03: 125 mg via INTRAVENOUS
  Filled 2018-09-03: qty 2

## 2018-09-03 MED ORDER — EPINEPHRINE 0.3 MG/0.3ML IJ SOAJ
0.3000 mg | Freq: Once | INTRAMUSCULAR | Status: AC
  Administered 2018-09-03: 0.3 mg via INTRAMUSCULAR
  Filled 2018-09-03: qty 0.3

## 2018-09-03 MED ORDER — EPINEPHRINE 0.3 MG/0.3ML IJ SOAJ
0.3000 mg | Freq: Once | INTRAMUSCULAR | Status: AC
  Administered 2018-09-03: 0.3 mg via INTRAMUSCULAR

## 2018-09-03 NOTE — ED Triage Notes (Signed)
Pt arrived via POV with reports of swelling of the lips and neck that first began around 0900 this morning. Pt states she has hx of this since the 80s and denies any lisinopril use.   Pt states she noticed a bump on her lip and was picking at it and the swelling progressed to her lips down through her neck. Pt denies any difficulty breathing and no tongue swelling noted at this time.

## 2018-09-03 NOTE — ED Provider Notes (Signed)
[facility] Emergency Department Provider Note   ____________________________________________   First MD Initiated Contact with Patient 09/03/18 1715     (approximate)  I have reviewed the triage vital signs and the nursing notes.   HISTORY  Chief Complaint Angioedema    HPI Kristina Cruz is a 58 y.o. female who reports she occasionally gets some swelling of her lower lip.  She can take Benadryl and it goes away.  This morning she was looking had a spot on the inside of her lower lip and when she picked on her her lip began to swell and is continued swelling is not stopping is usually does.  She also has a spot on the left side of her tongue on the edge which is been annoying her for several weeks.  Patient reports the swelling is spread from her lower lip is now beginning in her upper lip and under the chin as well.  She is not short of breath has no trouble swallowing she has no fever   Past Medical History:  Diagnosis Date  . Anemia   . Back pain   . Diabetes mellitus without complication (HCC)   . GERD (gastroesophageal reflux disease)   . Hypertension   . Restless leg syndrome     Patient Active Problem List   Diagnosis Date Noted  . Restless leg syndrome 12/16/2017  . Hypertension 12/16/2017  . 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 Endometrial polyp 11/11/2017  . Post-menopausal bleeding 11/11/2017  . Endometrial thickening on ultrasound 04/27/2017  . Morbid obesity (HCC) 04/27/2017    Past Surgical History:  Procedure Laterality Date  . HYSTEROSCOPY W/D&C N/A 07/14/2017   Procedure: DILATATION AND CURETTAGE /HYSTEROSCOPY;  Surgeon: Nadara MustardHarris, Robert P, MD;  Location: [facility] ORS;  Service: Gynecology;  Laterality: N/A;  . POLYPECTOMY  2015    Prior to Admission medications   Medication Sig Start Date End Date Taking? Authorizing Provider  acetaminophen (TYLENOL 8 HOUR ARTHRITIS PAIN) 650 MG CR tablet Take 1,950  mg by mouth every 8 (eight) hours as needed for pain.   Yes [provider]  furosemide (LASIX) 20 MG tablet Take 1 tablet (20 mg total) by mouth daily. 01/15/17 09/03/18 Yes Irean HongSung, Jade J, MD  metFORMIN (GLUCOPHAGE) 500 MG tablet Take 500 mg by mouth 2 (two) times daily.  07/01/17  Yes [provider]  rOPINIRole (REQUIP) 2 MG tablet Take 2 mg by mouth 3 (three) times daily. IN THE MORNING, AT 1700, & AT MIDNIGHT   Yes [provider]  predniSONE (DELTASONE) 20 MG tablet Take 1 tablet (20 mg total) by mouth daily. 09/04/18 09/04/19  Arnaldo NatalMalinda, Paul F, MD  ranitidine (ZANTAC) 150 MG tablet Take 1 tablet (150 mg total) by mouth 2 (two) times daily. 09/04/18 09/04/19  Arnaldo NatalMalinda, Paul F, MD    Allergies Contrast media [iodinated diagnostic agents]  Family History  Problem Relation Age of Onset  . Breast cancer Mother [id number]  . Diabetes Mother   . Hypertension Mother   . Ovarian cancer Paternal Aunt        ?  . Diabetes Father   . Hypertension Father     Social History Social History   Tobacco Use  . Smoking status: Never Smoker  . Smokeless tobacco: Never Used  Substance Use Topics  . Alcohol use: No  . Drug use: No    Review of Systems  Constitutional: No fever/chills Eyes: No visual changes. ENT: No sore throat. Cardiovascular: Denies  chest pain. Respiratory: Denies shortness of breath. Gastrointestinal: No abdominal pain.  No nausea, no vomiting.  No diarrhea.  No constipation. Genitourinary: Negative for dysuria. Musculoskeletal: Negative for back pain. Skin: Negative for rash. Neurological: Negative for headaches, focal weakness  ____________________________________________   PHYSICAL EXAM:  VITAL SIGNS: ED Triage Vitals  Enc Vitals Group     BP 09/03/18 1706 (!) 164/86     Pulse Rate 09/03/18 1706 70     Resp 09/03/18 1706 20     Temp --      Temp src --      SpO2 09/03/18 1706 100 %     Weight 09/03/18 1705 (!) 329 lb (149.2 kg)     Height  09/03/18 1705 5\' 2"  (1.575 m)     Head Circumference --      Peak Flow --      Pain Score 09/03/18 1705 8     Pain Loc --      Pain Edu? --      Excl. in GC? --     Constitutional: Alert and oriented. Well appearing and in no acute distress. Eyes: Conjunctivae are normal.  Head: Atraumatic. Nose: No congestion/rhinnorhea. Mouth/Throat: Mucous membranes are moist.  Oropharynx non-erythematous.  Lower lip is edematous.  The right corner of the upper lip is beginning to swell as well.  There is some puffiness around the chin 2.  I do not see any lesions on the lip at all there is a small 2 mm gray-based ulcer on the lateral border of the tongue this is not firm.  It is not tender.  Patient has an appointment with her regular doctor and with her dentist this week.  I will have her follow-up with them for this problem. Neck: No stridor. Cardiovascular: Normal rate, regular rhythm. Grossly normal heart sounds.  Good peripheral circulation. Respiratory: Normal respiratory effort.  No retractions. Lungs CTAB. Gastrointestinal: Soft and nontender. No distention. No abdominal bruits.  Musculoskeletal: No lower extremity tenderness nor edema. Neurologic:  Normal speech and language. No gross focal neurologic deficits are appreciated. No gait instability. Skin:  Skin is warm, dry and intact. No rash noted.  ____________________________________________   LABS (all labs ordered are listed, but only abnormal results are displayed)  Labs Reviewed  CBC - Abnormal; Notable for the following components:      Result Value   Hemoglobin 11.9 (*)    MCV 78.8 (*)    MCH 24.2 (*)    RDW 21.1 (*)    Platelets 461 (*)    All other components within normal limits  BASIC METABOLIC PANEL - Abnormal; Notable for the following components:   Calcium 8.8 (*)    All other components within normal limits  CBC WITH DIFFERENTIAL/PLATELET - Abnormal; Notable for the following components:   MCV 79.5 (*)    MCH 24.6  (*)    RDW 21.1 (*)    Platelets 492 (*)    All other components within normal limits   ____________________________________________  EKG   ____________________________________________  RADIOLOGY  ED MD interpretation:   Official radiology report(s): No results found.  ____________________________________________   PROCEDURES  Procedure(s) performed:   Procedures  Critical Care performed:   ____________________________________________   INITIAL IMPRESSION / ASSESSMENT AND PLAN / ED COURSE  ----------------------------------------- 5:59 PM on 09/03/2018 -----------------------------------------  Patient complains of increased pain and some swelling.  Her upper lip is now swollen about halfway across.  We will give her another 50 Benadryl and  an EpiPen.   ----------------------------------------- 12:08 AM on 09/04/2018 -----------------------------------------  Patient slipped has been going on for about 2 hours now it still going down I will let her go.  She knows to return if there is any problems at all and to call 911 if she is short of breath.  She will have enough medicine to last for tonight and has EpiPen's at home as well.      ____________________________________________   FINAL CLINICAL IMPRESSION(S) / ED DIAGNOSES  Final diagnoses:  Angioedema, initial encounter     ED Discharge Orders         Ordered    predniSONE (DELTASONE) 20 MG tablet  Daily     09/04/18 0007    ranitidine (ZANTAC) 150 MG tablet  2 times daily     09/04/18 0008           Note:  This document was prepared using Dragon voice recognition software and may include unintentional dictation errors.    Arnaldo Natal, MD 09/04/18 [phone]

## 2018-09-03 NOTE — ED Notes (Signed)
Pt back in bed, appears in no distress, states "it might be getting better" (swelling of lips). Will continue to monitor.

## 2018-09-03 NOTE — ED Notes (Signed)
Pt appears in no distress, vss. Pt's bottom lip continues to be swollen, airway patent, pt denies complaints.

## 2018-09-03 NOTE — ED Notes (Signed)
Pt states she feels her upper lip is getting bigger again and that she is concerned, this RN discussed with dr Darnelle Catalan who went in to assess patient.

## 2018-09-03 NOTE — ED Notes (Signed)
Pt in bed, asking for water, encouraged to wait until lips less swollen. Breathing WNL. Pt appears in no distress.

## 2018-09-03 NOTE — ED Notes (Signed)
Respirations even and unlabored, sats 100% on RA.

## 2018-09-03 NOTE — ED Notes (Signed)
Pt assisted to bsc, tolerated well.

## 2018-09-04 DIAGNOSIS — T783XXA Angioneurotic edema, initial encounter: Secondary | ICD-10-CM | POA: Diagnosis not present

## 2018-09-04 MED ORDER — RANITIDINE HCL 150 MG PO TABS: 150.0000 mg | ORAL_TABLET | Freq: Two times a day (BID) | ORAL | 0 refills | Status: DC

## 2018-09-04 MED ORDER — PREDNISONE 20 MG PO TABS: 20.0000 mg | ORAL_TABLET | Freq: Every day | ORAL | 0 refills | Status: AC

## 2018-09-04 MED ORDER — PREDNISONE 20 MG PO TABS
60.0000 mg | ORAL_TABLET | Freq: Once | ORAL | Status: AC
  Administered 2018-09-04: 60 mg via ORAL
  Filled 2018-09-04: qty 3

## 2018-09-04 NOTE — Discharge Instructions (Addendum)
Please take 2 of the over-the-counter Benadryl 4 times tomorrow.  That would be once every 6 hours.  Take the prednisone 1 dose t in the afternoon for the next 2 days and the Zantac 1 twice a day.  Please return here immediately if you get worse.  Call 911 if you have any trouble breathing.  Follow-up with a allergy doctor.

## 2018-09-27 ENCOUNTER — Other Ambulatory Visit: Payer: Self-pay | Admitting: Family Medicine

## 2018-09-27 DIAGNOSIS — Z1231 Encounter for screening mammogram for malignant neoplasm of breast: Secondary | ICD-10-CM

## 2018-10-20 ENCOUNTER — Ambulatory Visit
Admission: RE | Admit: 2018-10-20 | Discharge: 2018-10-20 | Disposition: A | Discharge status: Home / Self Care | Payer: Medicare Other | Source: Ambulatory Visit | Attending: Family Medicine | Admitting: Family Medicine

## 2018-10-20 DIAGNOSIS — Z1231 Encounter for screening mammogram for malignant neoplasm of breast: Secondary | ICD-10-CM | POA: Insufficient documentation

## 2018-11-07 IMAGING — US US TRANSVAGINAL NON-OB
1 series · 14 of 25 positions shown · non-contrast
Comparison: CT 03/12/2017

CLINICAL DATA: Suprapubic pain, abnormal CT

EXAM:
TRANSABDOMINAL AND TRANSVAGINAL ULTRASOUND OF PELVIS
TECHNIQUE: Both transabdominal and transvaginal ultrasound examinations of the
pelvis were performed. Transabdominal technique was performed for
global imaging of the pelvis including uterus, ovaries, adnexal
regions, and pelvic cul-de-sac. It was necessary to proceed with
endovaginal exam following the transabdominal exam to visualize the
endometrium and right ovary.

[Series 1: us transvaginal non-ob · 0.24mm/px · 14 of 61 slices shown]
[im 1/61]
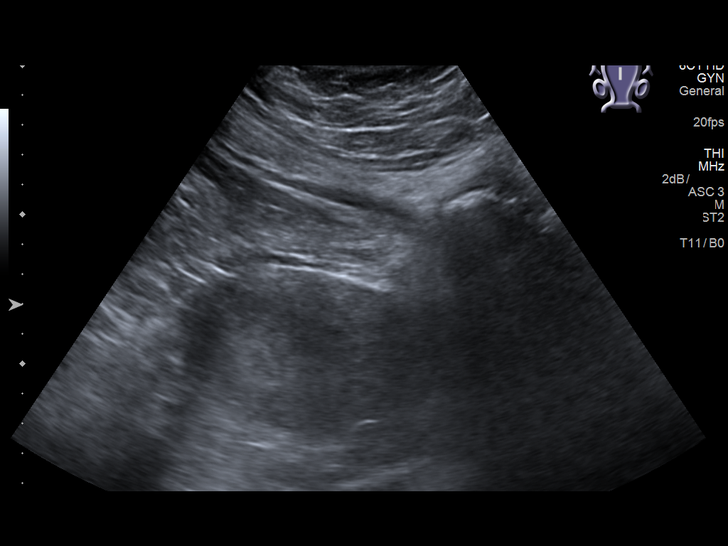
[im 6/61]
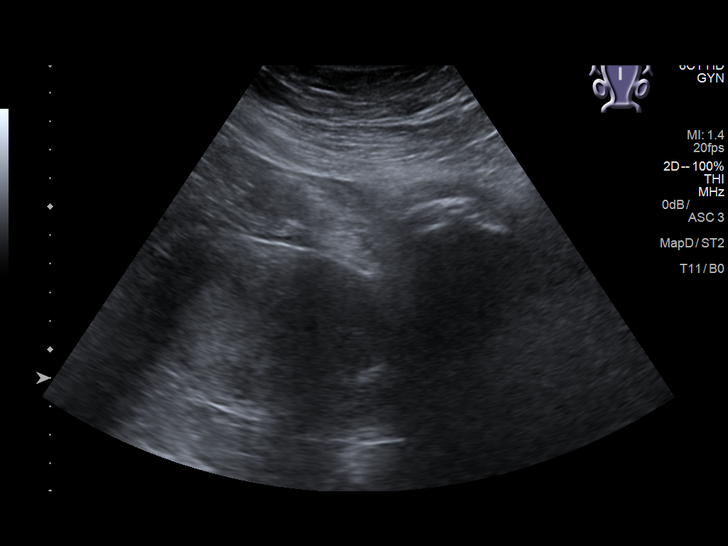
[im 11/61]
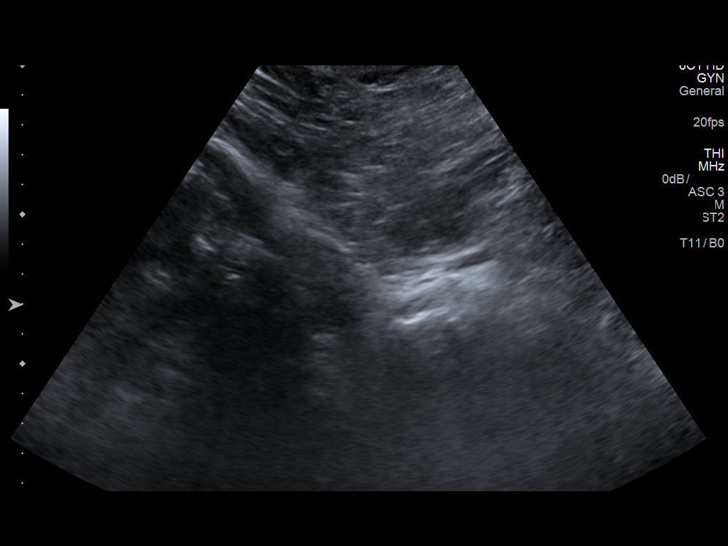
[im 16/61]
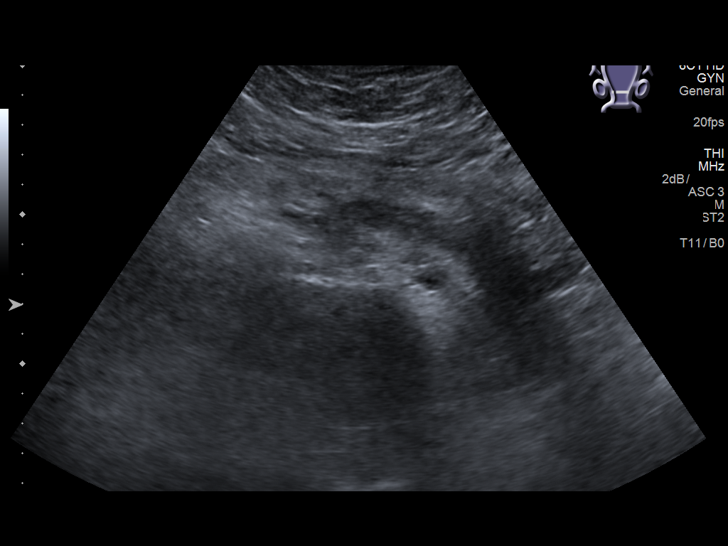
[im 21/61]
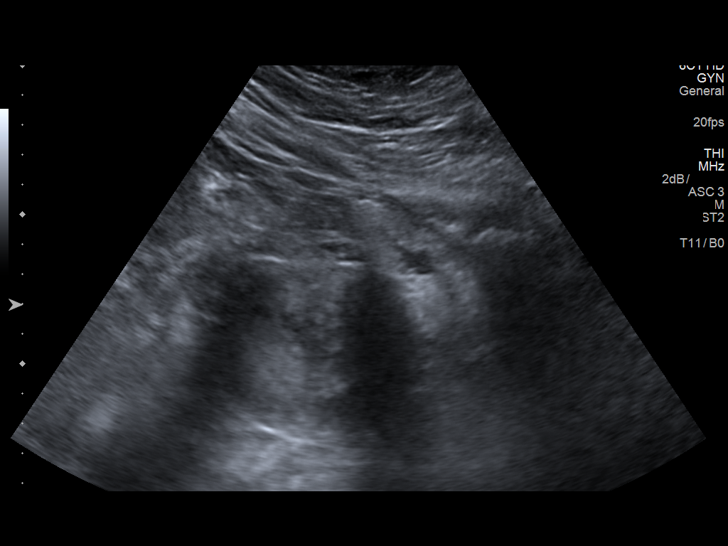
[im 23/61]
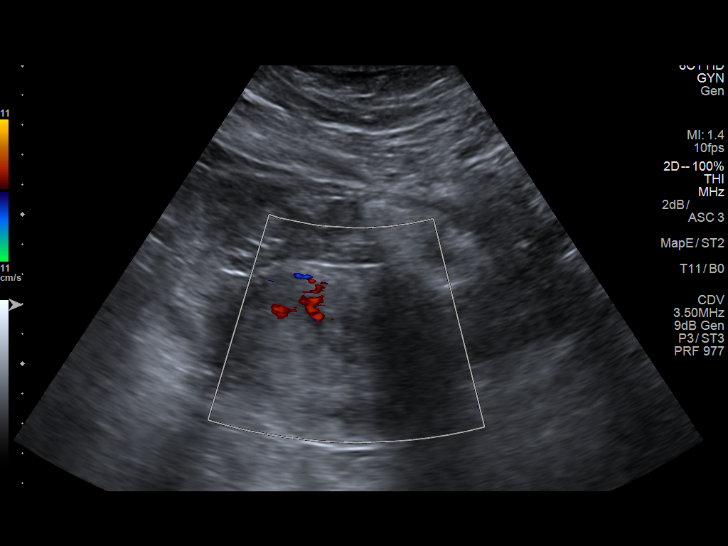
[im 28/61]
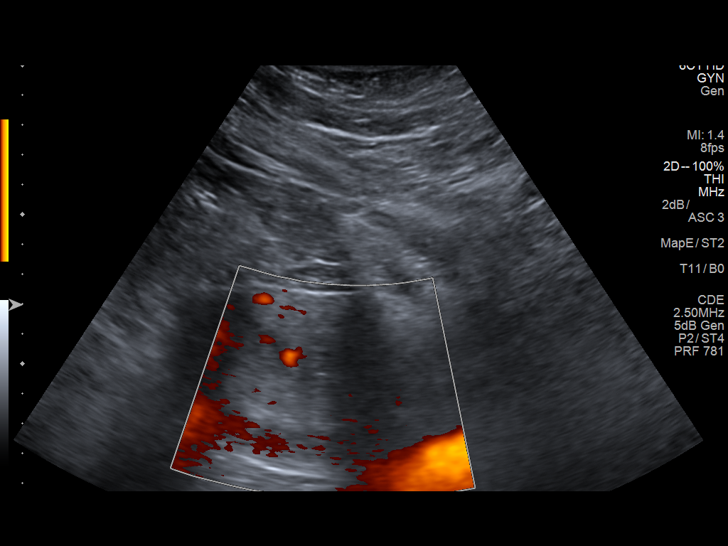
[im 33/61]
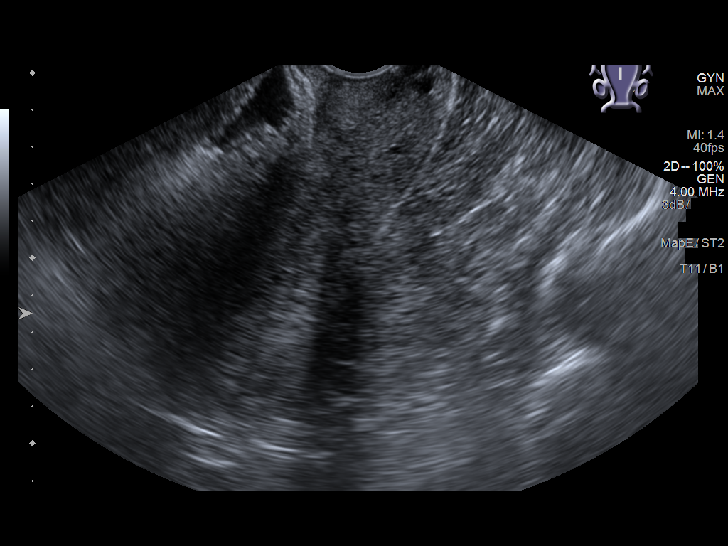
[im 38/61]
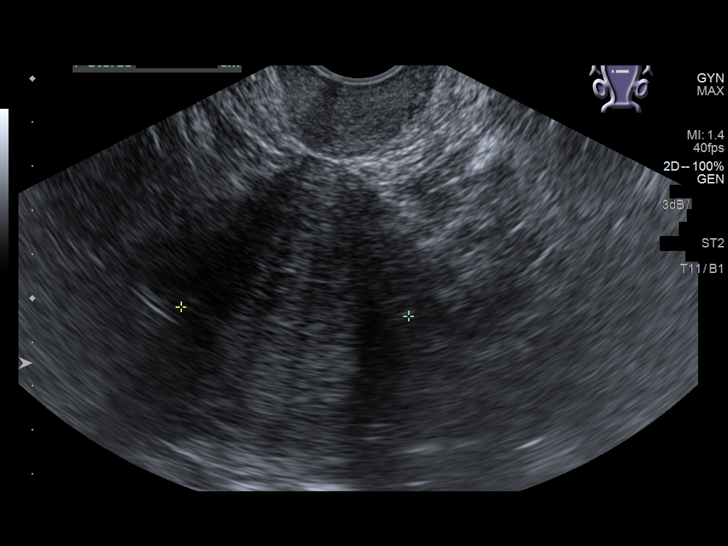
[im 41/61]
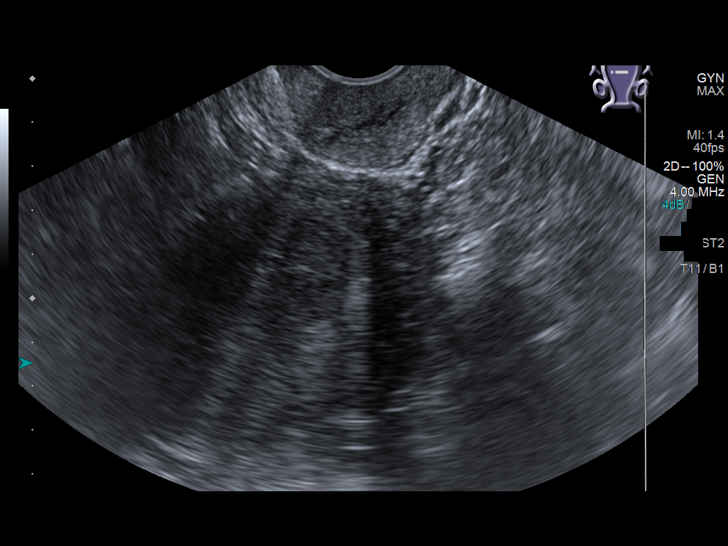
[im 46/61]
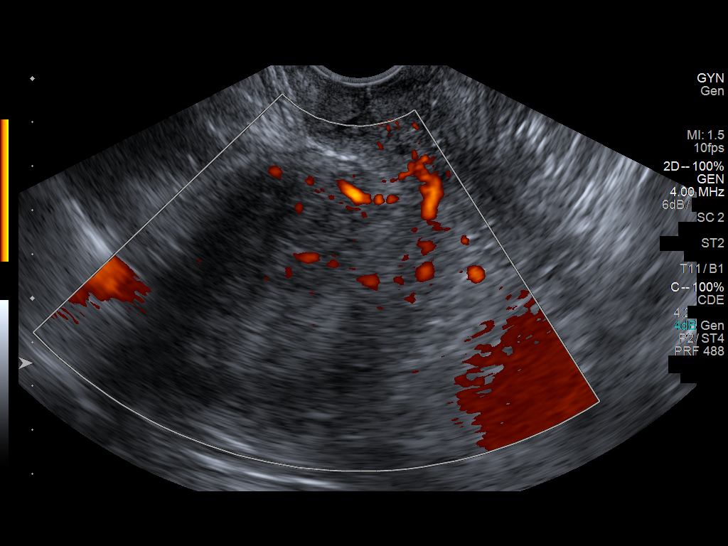
[im 51/61]
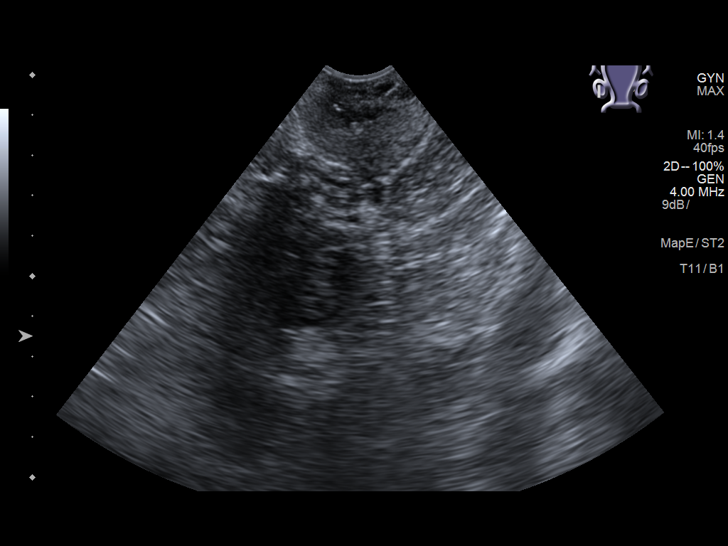
[im 56/61]
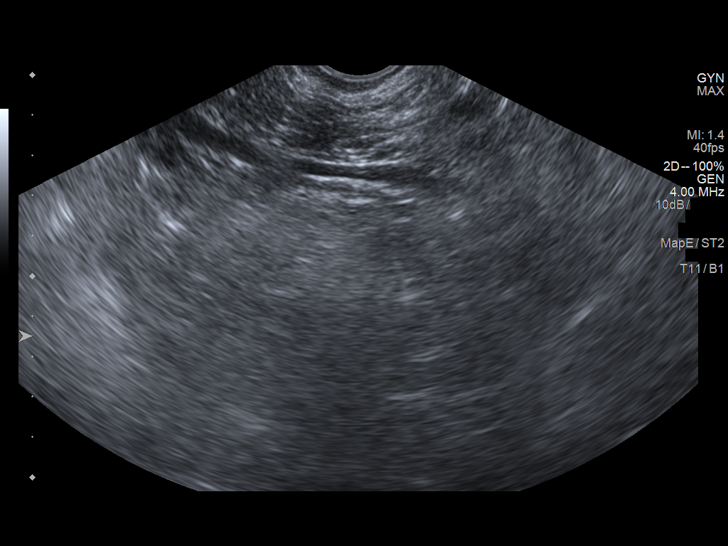
[im 61/61]
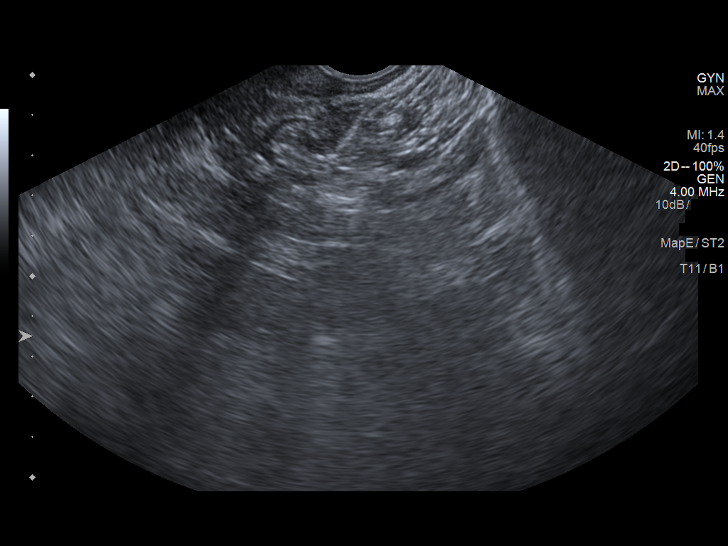

[14 of 25 positions shown; findings below may reference images not displayed]

FINDINGS: Uterus

Measurements: 10.6 x 5.8 x 5.8 cm. No fibroids or other mass
visualized.

Endometrium

Thickness: 32 mm.  No focal abnormality visualized.

Right ovary

Measurements: 2.8 x 1.7 x 1.7 cm. Normal appearance/no adnexal mass.

Left ovary

Nonvisualized

Other findings

No abnormal free fluid.
IMPRESSION: 1. Endometrial thickness measures 32 mm. Endometrial thickness is
considered abnormal. Consider follow-up by US in 6-8 weeks, during
the week immediately following menses (exam timing is critical).
2. Nonvisualized left ovary.

## 2019-09-03 ENCOUNTER — Other Ambulatory Visit: Payer: Self-pay

## 2019-09-03 DIAGNOSIS — Z7984 Long term (current) use of oral hypoglycemic drugs: Secondary | ICD-10-CM | POA: Diagnosis not present

## 2019-09-03 DIAGNOSIS — R109 Unspecified abdominal pain: Secondary | ICD-10-CM | POA: Diagnosis present

## 2019-09-03 DIAGNOSIS — Z79899 Other long term (current) drug therapy: Secondary | ICD-10-CM | POA: Diagnosis not present

## 2019-09-03 DIAGNOSIS — E119 Type 2 diabetes mellitus without complications: Secondary | ICD-10-CM | POA: Insufficient documentation

## 2019-09-03 DIAGNOSIS — I10 Essential (primary) hypertension: Secondary | ICD-10-CM | POA: Diagnosis not present

## 2019-09-03 DIAGNOSIS — R1032 Left lower quadrant pain: Secondary | ICD-10-CM | POA: Diagnosis not present

## 2019-09-03 LAB — COMPREHENSIVE METABOLIC PANEL
ALT: 14 U/L (ref 0–44)
AST: 11 U/L — ABNORMAL LOW (ref 15–41)
Albumin: 3.9 g/dL (ref 3.5–5.0)
Alkaline Phosphatase: 87 U/L (ref 38–126)
Anion gap: 12 (ref 5–15)
BUN: 14 mg/dL (ref 6–20)
CO2: 24 mmol/L (ref 22–32)
Calcium: 8.8 mg/dL — ABNORMAL LOW (ref 8.9–10.3)
Chloride: 103 mmol/L (ref 98–111)
Creatinine, Ser: 0.99 mg/dL (ref 0.44–1.00)
GFR calc Af Amer: >60 mL/min (ref >60)
GFR calc non Af Amer: >60 mL/min (ref >60)
Glucose, Bld: 100 mg/dL — ABNORMAL HIGH (ref 70–99)
Potassium: 3.9 mmol/L (ref 3.5–5.1)
Sodium: 139 mmol/L (ref 135–145)
Total Bilirubin: 0.6 mg/dL (ref 0.3–1.2)
Total Protein: 8.1 g/dL (ref 6.5–8.1)

## 2019-09-03 LAB — LIPASE, BLOOD: Lipase: 18 U/L (ref 11–51)

## 2019-09-03 LAB — CBC
HCT: 39.5 % (ref 36.0–46.0)
Hemoglobin: 12.7 g/dL (ref 12.0–15.0)
MCH: 27 pg (ref 26.0–34.0)
MCHC: 32.2 g/dL (ref 30.0–36.0)
MCV: 84 fL (ref 80.0–100.0)
Platelets: 325 10*3/uL (ref 150–400)
RBC: 4.7 MIL/uL (ref 3.87–5.11)
RDW: 15.6 % — ABNORMAL HIGH (ref 11.5–15.5)
WBC: 5.5 10*3/uL (ref 4.0–10.5)
nRBC: 0 % (ref 0.0–0.2)

## 2019-09-03 MED ORDER — SODIUM CHLORIDE 0.9% FLUSH
3.0000 mL | Freq: Once | INTRAVENOUS | Status: AC
  Administered 2019-09-04: 3 mL via INTRAVENOUS

## 2019-09-03 NOTE — ED Triage Notes (Signed)
L flank pain radiating around to abdomen x 2 months. Denies pain with urination. Denies being worked up for this prior to today.

## 2019-09-03 NOTE — ED Notes (Signed)
Pt had 20g IV placed by a triage RN  In the right [facility] that was removed by this RN per patient request, Pt c/o pain.

## 2019-09-04 ENCOUNTER — Emergency Department: Payer: Medicare Other

## 2019-09-04 ENCOUNTER — Emergency Department
Admission: EM | Admit: 2019-09-04 | Discharge: 2019-09-05 | Disposition: A | Discharge status: Home / Self Care | Payer: Medicare Other | Attending: Emergency Medicine | Admitting: Emergency Medicine

## 2019-09-04 DIAGNOSIS — R109 Unspecified abdominal pain: Secondary | ICD-10-CM

## 2019-09-04 DIAGNOSIS — R1032 Left lower quadrant pain: Secondary | ICD-10-CM | POA: Diagnosis not present

## 2019-09-04 LAB — URINALYSIS, COMPLETE (UACMP) WITH MICROSCOPIC
Bilirubin Urine: NEGATIVE
Glucose, UA: NEGATIVE mg/dL
Hgb urine dipstick: NEGATIVE
Ketones, ur: NEGATIVE mg/dL
Leukocytes,Ua: NEGATIVE
Nitrite: NEGATIVE
Protein, ur: NEGATIVE mg/dL
Specific Gravity, Urine: 1.013 (ref 1.005–1.030)
pH: 6 (ref 5.0–8.0)

## 2019-09-04 MED ORDER — FENTANYL CITRATE (PF) 100 MCG/2ML IJ SOLN
50.0000 ug | INTRAMUSCULAR | Status: DC | PRN
  Administered 2019-09-04: 02:00:00 50 ug via INTRAVENOUS
  Filled 2019-09-04: qty 2

## 2019-09-04 MED ORDER — ONDANSETRON HCL 4 MG/2ML IJ SOLN
4.0000 mg | Freq: Once | INTRAMUSCULAR | Status: AC
  Administered 2019-09-04: 03:00:00 4 mg via INTRAVENOUS
  Filled 2019-09-04: qty 2

## 2019-09-04 MED ORDER — MORPHINE SULFATE (PF) 4 MG/ML IV SOLN
4.0000 mg | Freq: Once | INTRAVENOUS | Status: AC
  Administered 2019-09-04: 03:00:00 4 mg via INTRAVENOUS
  Filled 2019-09-04: qty 1

## 2019-09-04 MED ORDER — TRAMADOL HCL 50 MG PO TABS: 50.0000 mg | ORAL_TABLET | Freq: Four times a day (QID) | ORAL | 0 refills | Status: DC | PRN

## 2019-09-04 NOTE — ED Provider Notes (Signed)
[facility] Emergency Department Provider Note  Time seen: 2:44 AM  I have reviewed the triage vital signs and the nursing notes.   HISTORY  Chief Complaint Abdominal Pain and Flank Pain   HPI Kristina Cruz is a 59 y.o. female with a past medical history of anemia, diabetes, chronic back pain, hypertension, obesity, presents to the emergency department for left flank pain.  According to the patient over the past 2 months she has been intermittently experiencing sharp left flank pain that she states will last for hours or more at a time and then will go away.  Patient states she has some itching to her left flank as well but denies any rash.  Denies any fever cough congestion or shortness of breath.  No dysuria or dark urine.  No vomiting or diarrhea.   No history of kidney stones.  Past Medical History:  Diagnosis Date  . Anemia   . Back pain   . Diabetes mellitus without complication ([place])   . GERD (gastroesophageal reflux disease)   . Hypertension   . Restless leg syndrome     Patient Active Problem List   Diagnosis Date Noted  . Restless leg syndrome 12/16/2017  . Hypertension 12/16/2017  . 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 Endometrial polyp 11/11/2017  . Post-menopausal bleeding 11/11/2017  . Endometrial thickening on ultrasound 04/27/2017  . Morbid obesity ([place]) 04/27/2017    Past Surgical History:  Procedure Laterality Date  . HYSTEROSCOPY WITH D & C N/A 07/14/2017   Procedure: DILATATION AND CURETTAGE /HYSTEROSCOPY;  Surgeon: Gae Dry, MD;  Location: [facility] ORS;  Service: Gynecology;  Laterality: N/A;  . POLYPECTOMY  2015    Prior to Admission medications   Medication Sig Start Date End Date Taking? Authorizing Provider  acetaminophen (TYLENOL 8 HOUR ARTHRITIS PAIN) 650 MG CR tablet Take 1,950 mg by mouth every 8 (eight) hours as needed for pain.    [provider]  furosemide (LASIX) 20 MG  tablet Take 1 tablet (20 mg total) by mouth daily. 01/15/17 09/03/18  Paulette Blanch, MD  metFORMIN (GLUCOPHAGE) 500 MG tablet Take 500 mg by mouth 2 (two) times daily.  07/01/17   [provider]  predniSONE (DELTASONE) 20 MG tablet Take 1 tablet (20 mg total) by mouth daily. 09/04/18 09/04/19  Nena Polio, MD  ranitidine (ZANTAC) 150 MG tablet Take 1 tablet (150 mg total) by mouth 2 (two) times daily. 09/04/18 09/04/19  Nena Polio, MD  rOPINIRole (REQUIP) 2 MG tablet Take 2 mg by mouth 3 (three) times daily. IN THE MORNING, AT 1700, & AT MIDNIGHT    [provider]    Allergies  Allergen Reactions  . Contrast Media [Iodinated Diagnostic Agents] Hives    Family History  Problem Relation Age of Onset  . Breast cancer Mother 76  . Diabetes Mother   . Hypertension Mother   . Ovarian cancer Paternal Aunt        ?  . Diabetes Father   . Hypertension Father     Social History Social History   Tobacco Use  . Smoking status: Never Smoker  . Smokeless tobacco: Never Used  Substance Use Topics  . Alcohol use: No  . Drug use: No    Review of Systems Constitutional: Negative for fever. Cardiovascular: Negative for chest pain. Respiratory: Negative for shortness of breath. Gastrointestinal: Left flank pain.  Negative for nausea vomiting or diarrhea. Genitourinary: Negative for urinary compaints Skin:  Negative for skin complaints  Neurological: Negative for headache All other ROS negative  ____________________________________________   PHYSICAL EXAM:  VITAL SIGNS: ED Triage Vitals [09/03/19 1537]  Enc Vitals Group     BP (!) 209/74     Pulse Rate 66     Resp 20     Temp 98.3 F (36.8 C)     Temp Source Oral     SpO2 100 %     Weight (!) 329 lb (149.2 kg)     Height 5\' 2"  (1.575 m)     Head Circumference      Peak Flow      Pain Score 9     Pain Loc      Pain Edu?      Excl. in GC?    Constitutional: Alert and oriented. Well appearing and in no  distress. Eyes: Normal exam ENT      Head: Normocephalic and atraumatic.      Mouth/Throat: Mucous membranes are moist. Cardiovascular: Normal rate, regular rhythm. No murmur Respiratory: Normal respiratory effort without tachypnea nor retractions. Breath sounds are clear Gastrointestinal: Soft and nontender. No distention.   Musculoskeletal: Nontender with normal range of motion in all extremities.  Neurologic:  Normal speech and language. No gross focal neurologic deficits  Skin:  Skin is warm, dry.  No rash. Psychiatric: Mood and affect are normal.    RADIOLOGY  CT scan negative for acute abnormality  ____________________________________________   INITIAL IMPRESSION / ASSESSMENT AND PLAN / ED COURSE  Pertinent labs & imaging results that were available during my care of the patient were reviewed by me and considered in my medical decision making (see chart for details).   Patient presents emergency department for left flank pain intermittent over the past 2 months.  Overall the patient appears well, no distress states 6/10 sharp type pain to left flank currently.  Differential would include musculoskeletal pain, ureterolithiasis, no sign of shingles on examination.  We will proceed with a CT renal scan to further evaluate as well as obtain a urinalysis to rule out UTI/pyonephritis.  Patient's lab work is overall reassuring including a normal white blood cell count.  CT scan negative for acute abnormality.  We will discharge patient home with PCP follow-up.  Patient agreeable to plan of care.  Kristina Cruz was evaluated in Emergency Department on 09/04/2019 for the symptoms described in the history of present illness. She was evaluated in the context of the global COVID-19 pandemic, which necessitated consideration that the patient might be at risk for infection with the SARS-CoV-2 virus that causes COVID-19. Institutional protocols and algorithms that pertain to the evaluation of  patients at risk for COVID-19 are in a state of rapid change based on information released by regulatory bodies including the CDC and federal and state organizations. These policies and algorithms were followed during the patient's care in the ED.  ____________________________________________   FINAL CLINICAL IMPRESSION(S) / ED DIAGNOSES  Left flank pain   09/06/2019, MD 09/04/19 [phone]

## 2019-09-04 NOTE — ED Notes (Signed)
Patient transported to CT 

## 2020-04-10 ENCOUNTER — Encounter: Payer: Self-pay | Admitting: Ophthalmology

## 2020-04-11 ENCOUNTER — Other Ambulatory Visit: Payer: Self-pay

## 2020-04-11 ENCOUNTER — Other Ambulatory Visit
Admission: RE | Admit: 2020-04-11 | Discharge: 2020-04-11 | Disposition: A | Discharge status: Home / Self Care | Payer: Medicare Other | Source: Ambulatory Visit | Attending: Ophthalmology | Admitting: Ophthalmology

## 2020-04-11 DIAGNOSIS — Z20822 Contact with and (suspected) exposure to covid-19: Secondary | ICD-10-CM | POA: Diagnosis not present

## 2020-04-11 DIAGNOSIS — Z01812 Encounter for preprocedural laboratory examination: Secondary | ICD-10-CM | POA: Insufficient documentation

## 2020-04-12 LAB — SARS CORONAVIRUS 2 (TAT 6-24 HRS): SARS Coronavirus 2: NEGATIVE

## 2020-04-13 ENCOUNTER — Encounter: Payer: Self-pay | Admitting: Ophthalmology

## 2020-04-13 ENCOUNTER — Ambulatory Visit: Payer: Medicare Other | Admitting: Anesthesiology

## 2020-04-13 ENCOUNTER — Ambulatory Visit
Admission: RE | Admit: 2020-04-13 | Discharge: 2020-04-13 | Disposition: A | Discharge status: Home / Self Care | Payer: Medicare Other | Attending: Ophthalmology | Admitting: Ophthalmology

## 2020-04-13 ENCOUNTER — Other Ambulatory Visit: Payer: Self-pay

## 2020-04-13 ENCOUNTER — Encounter
Admission: RE | Disposition: A | Discharge status: Home / Self Care | Payer: Self-pay | Source: Home / Self Care | Attending: Ophthalmology

## 2020-04-13 DIAGNOSIS — Z6841 Body Mass Index (BMI) 40.0 and over, adult: Secondary | ICD-10-CM | POA: Diagnosis not present

## 2020-04-13 DIAGNOSIS — I10 Essential (primary) hypertension: Secondary | ICD-10-CM | POA: Insufficient documentation

## 2020-04-13 DIAGNOSIS — J45909 Unspecified asthma, uncomplicated: Secondary | ICD-10-CM | POA: Insufficient documentation

## 2020-04-13 DIAGNOSIS — H2512 Age-related nuclear cataract, left eye: Secondary | ICD-10-CM | POA: Diagnosis not present

## 2020-04-13 DIAGNOSIS — Z7984 Long term (current) use of oral hypoglycemic drugs: Secondary | ICD-10-CM | POA: Diagnosis not present

## 2020-04-13 DIAGNOSIS — E1136 Type 2 diabetes mellitus with diabetic cataract: Secondary | ICD-10-CM | POA: Insufficient documentation

## 2020-04-13 DIAGNOSIS — G2581 Restless legs syndrome: Secondary | ICD-10-CM | POA: Diagnosis not present

## 2020-04-13 DIAGNOSIS — Z79899 Other long term (current) drug therapy: Secondary | ICD-10-CM | POA: Diagnosis not present

## 2020-04-13 HISTORY — DX: Body Mass Index (BMI) 40.0 and over, adult: Z684

## 2020-04-13 HISTORY — DX: Unspecified asthma, uncomplicated: J45.909

## 2020-04-13 HISTORY — PX: CATARACT EXTRACTION W/PHACO: SHX586

## 2020-04-13 HISTORY — DX: Morbid (severe) obesity due to excess calories: E66.01

## 2020-04-13 HISTORY — DX: Unspecified cataract: H26.9

## 2020-04-13 LAB — GLUCOSE, CAPILLARY
Glucose-Capillary: 125 mg/dL — ABNORMAL HIGH (ref 70–99)
Glucose-Capillary: 120 mg/dL — ABNORMAL HIGH (ref 70–99)

## 2020-04-13 SURGERY — PHACOEMULSIFICATION, CATARACT, WITH IOL INSERTION
Anesthesia: Monitor Anesthesia Care | Site: Eye | Laterality: Left

## 2020-04-13 MED ORDER — CARBACHOL 0.01 % IO SOLN
INTRAOCULAR | Status: DC | PRN
  Administered 2020-04-13: 0.5 mL via INTRAOCULAR

## 2020-04-13 MED ORDER — MOXIFLOXACIN HCL 0.5 % OP SOLN
OPHTHALMIC | Status: AC
  Filled 2020-04-13: qty 3

## 2020-04-13 MED ORDER — ARMC OPHTHALMIC DILATING DROPS
OPHTHALMIC | Status: AC
  Administered 2020-04-13: 1 via OPHTHALMIC
  Filled 2020-04-13: qty 0.5

## 2020-04-13 MED ORDER — FENTANYL CITRATE (PF) 100 MCG/2ML IJ SOLN
INTRAMUSCULAR | Status: AC
  Filled 2020-04-13: qty 2

## 2020-04-13 MED ORDER — EPINEPHRINE PF 1 MG/ML IJ SOLN
INTRAMUSCULAR | Status: AC
  Filled 2020-04-13: qty 1

## 2020-04-13 MED ORDER — MOXIFLOXACIN HCL 0.5 % OP SOLN
OPHTHALMIC | Status: DC | PRN
  Administered 2020-04-13: 0.2 mL via OPHTHALMIC

## 2020-04-13 MED ORDER — TRYPAN BLUE 0.06 % OP SOLN
OPHTHALMIC | Status: DC | PRN
  Administered 2020-04-13: 0.5 mL via INTRAOCULAR

## 2020-04-13 MED ORDER — PROVISC 10 MG/ML IO SOLN
INTRAOCULAR | Status: DC | PRN
  Administered 2020-04-13: .85 mL via INTRAOCULAR

## 2020-04-13 MED ORDER — SODIUM CHLORIDE 0.9 % IV SOLN: INTRAVENOUS | Status: DC

## 2020-04-13 MED ORDER — TETRACAINE HCL 0.5 % OP SOLN
OPHTHALMIC | Status: AC
  Administered 2020-04-13: 1 [drp] via OPHTHALMIC
  Filled 2020-04-13: qty 4

## 2020-04-13 MED ORDER — LIDOCAINE HCL (PF) 4 % IJ SOLN
INTRAMUSCULAR | Status: AC
  Filled 2020-04-13: qty 5

## 2020-04-13 MED ORDER — TETRACAINE HCL 0.5 % OP SOLN
1.0000 [drp] | OPHTHALMIC | Status: AC | PRN
  Administered 2020-04-13: 1 [drp] via OPHTHALMIC

## 2020-04-13 MED ORDER — POVIDONE-IODINE 5 % OP SOLN
OPHTHALMIC | Status: DC | PRN
  Administered 2020-04-13: 1 via OPHTHALMIC

## 2020-04-13 MED ORDER — PROVISC 10 MG/ML IO SOLN
INTRAOCULAR | Status: AC
  Filled 2020-04-13: qty 0.55

## 2020-04-13 MED ORDER — TRYPAN BLUE 0.06 % OP SOLN
OPHTHALMIC | Status: AC
  Filled 2020-04-13: qty 0.5

## 2020-04-13 MED ORDER — MOXIFLOXACIN HCL 0.5 % OP SOLN: 1.0000 [drp] | OPHTHALMIC | Status: DC | PRN

## 2020-04-13 MED ORDER — MIDAZOLAM HCL 2 MG/2ML IJ SOLN
INTRAMUSCULAR | Status: AC
  Filled 2020-04-13: qty 2

## 2020-04-13 MED ORDER — NA CHONDROIT SULF-NA HYALURON 40-17 MG/ML IO SOLN
INTRAOCULAR | Status: AC
  Filled 2020-04-13: qty 1

## 2020-04-13 MED ORDER — LIDOCAINE HCL (PF) 4 % IJ SOLN
INTRAOCULAR | Status: DC | PRN
  Administered 2020-04-13: 4 mL via OPHTHALMIC

## 2020-04-13 MED ORDER — NEOMYCIN-POLYMYXIN-DEXAMETH 3.5-10000-0.1 OP OINT
TOPICAL_OINTMENT | OPHTHALMIC | Status: AC
  Filled 2020-04-13: qty 3.5

## 2020-04-13 MED ORDER — POVIDONE-IODINE 5 % OP SOLN
OPHTHALMIC | Status: AC
  Filled 2020-04-13: qty 30

## 2020-04-13 MED ORDER — NA CHONDROIT SULF-NA HYALURON 40-17 MG/ML IO SOLN
INTRAOCULAR | Status: DC | PRN
  Administered 2020-04-13: 1 mL via INTRAOCULAR

## 2020-04-13 MED ORDER — EPINEPHRINE PF 1 MG/ML IJ SOLN: INTRAOCULAR | Status: DC | PRN

## 2020-04-13 MED ORDER — ARMC OPHTHALMIC DILATING DROPS
1.0000 "application " | OPHTHALMIC | Status: AC
  Administered 2020-04-13 (×2): 1 via OPHTHALMIC

## 2020-04-13 MED ORDER — MIDAZOLAM HCL 2 MG/2ML IJ SOLN
INTRAMUSCULAR | Status: DC | PRN
  Administered 2020-04-13: 1 mg via INTRAVENOUS
  Administered 2020-04-13: 2 mg via INTRAVENOUS
  Administered 2020-04-13: 1 mg via INTRAVENOUS

## 2020-04-13 MED ORDER — FENTANYL CITRATE (PF) 100 MCG/2ML IJ SOLN
INTRAMUSCULAR | Status: DC | PRN
Start: 2020-04-13 — End: 2020-04-13
  Administered 2020-04-13 (×4): 25 ug via INTRAVENOUS

## 2020-04-13 SURGICAL SUPPLY — 18 items
DISSECTOR HYDRO NUCLEUS 50X22 (MISCELLANEOUS) ×12 IMPLANT
DRSG TEGADERM 2-3/8X2-3/4 SM (GAUZE/BANDAGES/DRESSINGS) ×3 IMPLANT
GLOVE BIOGEL M 6.5 STRL (GLOVE) ×3 IMPLANT
GOWN STRL REUS W/ TWL LRG LVL3 (GOWN DISPOSABLE) ×1 IMPLANT
GOWN STRL REUS W/ TWL XL LVL3 (GOWN DISPOSABLE) ×1 IMPLANT
GOWN STRL REUS W/TWL LRG LVL3 (GOWN DISPOSABLE) ×3
GOWN STRL REUS W/TWL XL LVL3 (GOWN DISPOSABLE) ×3
KNIFE 45D UP 2.3 (MISCELLANEOUS) ×3 IMPLANT
LABEL CATARACT MEDS ST (LABEL) ×3 IMPLANT
LENS IOL DIOP 15.5 (Intraocular Lens) ×3 IMPLANT
LENS IOL TECNIS MONO 15.5 (Intraocular Lens) ×1 IMPLANT
PACK CATARACT (MISCELLANEOUS) ×3 IMPLANT
PACK CATARACT KING (MISCELLANEOUS) ×3 IMPLANT
PACK EYE AFTER SURG (MISCELLANEOUS) ×3 IMPLANT
SOL BSS BAG (MISCELLANEOUS) ×3
SOLUTION BSS BAG (MISCELLANEOUS) ×1 IMPLANT
WATER STERILE IRR 250ML POUR (IV SOLUTION) ×3 IMPLANT
WIPE NON LINTING 3.25X3.25 (MISCELLANEOUS) ×3 IMPLANT

## 2020-04-13 NOTE — Transfer of Care (Signed)
Immediate Anesthesia Transfer of Care Note  Patient: Kristina Cruz  Procedure(s) Performed: CATARACT EXTRACTION PHACO AND INTRAOCULAR LENS PLACEMENT (IOC) (Left Eye)  Patient Location: Short Stay  Anesthesia Type:MAC  Level of Consciousness: awake, alert  and oriented  Airway & Oxygen Therapy: Patient Spontanous Breathing  Post-op Assessment: Post -op Vital signs reviewed and stable  Post vital signs: stable  Last Vitals:  Vitals Value Taken Time  BP 154/76 04/13/20 0833  Temp 36.7 C 04/13/20 0833  Pulse 72 04/13/20 0833  Resp 16 04/13/20 0833  SpO2 100 % 04/13/20 0833    Last Pain:  Vitals:   04/13/20 0833  TempSrc: Temporal  PainSc: 0-No pain         Complications: No complications documented.

## 2020-04-13 NOTE — Anesthesia Preprocedure Evaluation (Addendum)
Anesthesia Evaluation  Patient identified by MRN, date of birth, ID band Patient awake    Reviewed: Allergy & Precautions, H&P , NPO status , Patient's Chart, lab work & pertinent test results  History of Anesthesia Complications Negative for: history of anesthetic complications  Airway Mallampati: III  TM Distance: >3 FB     Dental   Pulmonary asthma , neg sleep apnea, neg COPD,    breath sounds clear to auscultation       Cardiovascular hypertension, (-) angina(-) Past MI and (-) Cardiac Stents (-) dysrhythmias  Rhythm:regular Rate:Normal     Neuro/Psych negative neurological ROS  negative psych ROS   GI/Hepatic Neg liver ROS, GERD  ,  Endo/Other  diabetesMorbid obesity (super morbid obesity, BMI 52)  Renal/GU      Musculoskeletal   Abdominal   Peds  Hematology negative hematology ROS (+)   Anesthesia Other Findings Past Medical History: No date: Anemia No date: Asthma No date: Back pain No date: Cataract No date: Diabetes mellitus without complication ([facility]) No date: GERD (gastroesophageal reflux disease) No date: Hypertension No date: Morbid obesity with BMI of 60.0-69.9, adult ([place]) No date: Restless leg syndrome  Past Surgical History: No date: ABDOMINAL HYSTERECTOMY 07/14/2017: HYSTEROSCOPY WITH D & C; N/A     Comment:  Procedure: DILATATION AND CURETTAGE /HYSTEROSCOPY;                Surgeon: Gae Dry, MD;  Location: [facility] ORS;                Service: Gynecology;  Laterality: N/A; 2015: POLYPECTOMY  BMI    Body Mass Index: 64.84 kg/m      Reproductive/Obstetrics negative OB ROS                           Anesthesia Physical Anesthesia Plan  ASA: III  Anesthesia Plan: MAC   Post-op Pain Management:    Induction:   PONV Risk Score and Plan:   Airway Management Planned: Nasal Cannula  Additional Equipment:   Intra-op Plan:   Post-operative Plan:    Informed Consent: I have reviewed the patients History and Physical, chart, labs and discussed the procedure including the risks, benefits and alternatives for the proposed anesthesia with the patient or authorized representative who has indicated his/her understanding and acceptance.       Plan Discussed with: Anesthesiologist, CRNA and Surgeon  Anesthesia Plan Comments:       Anesthesia Quick Evaluation

## 2020-04-13 NOTE — Anesthesia Postprocedure Evaluation (Signed)
Anesthesia Post Note  Patient: Kristina Cruz  Procedure(s) Performed: CATARACT EXTRACTION PHACO AND INTRAOCULAR LENS PLACEMENT (IOC) (Left Eye)  Patient location during evaluation: PACU Anesthesia Type: MAC Level of consciousness: awake and alert Pain management: pain level controlled Vital Signs Assessment: post-procedure vital signs reviewed and stable Respiratory status: spontaneous breathing, nonlabored ventilation, respiratory function stable and patient connected to nasal cannula oxygen Cardiovascular status: stable and blood pressure returned to baseline Postop Assessment: no apparent nausea or vomiting Anesthetic complications: no   No complications documented.   Last Vitals:  Vitals:   04/13/20 0618 04/13/20 0833  BP: (!) 144/68 (!) 154/76  Pulse: 64 72  Resp: 18 16  Temp: 36.6 C 36.7 C  SpO2: 98% 100%    Last Pain:  Vitals:   04/13/20 0833  TempSrc: Temporal  PainSc: 0-No pain                 Jacobb Alen,  Clearnce Sorrel

## 2020-04-13 NOTE — Op Note (Signed)
  PREOPERATIVE DIAGNOSIS:  Nuclear sclerotic cataract of the LEFT eye.   POSTOPERATIVE DIAGNOSIS:  Nuclear sclerotic cataract of the LEFT eye.   OPERATIVE PROCEDURE: Cataract surgery OS   SURGEON:  Elliot Cousin, MD.   ANESTHESIA:  Anesthesiologist: Karleen Hampshire, MD CRNA: Irving Burton, CRNA  1.      Managed anesthesia care. 2.     0.68ml of Shugarcaine was instilled following the paracentesis   COMPLICATIONS:  None.   TECHNIQUE:   Divide and conquer   DESCRIPTION OF PROCEDURE:  The patient was examined and consented in the preoperative holding area where the aforementioned topical anesthesia was applied to the LEFT eye and then brought back to the Operating Room where the left eye was prepped and draped in the usual sterile ophthalmic fashion and a lid speculum was placed. A paracentesis was created with the side port blade, the anterior chamber was washed out with trypan blue to stain the anterior capsule, and the anterior chamber was filled with viscoelastic. A near clear corneal incision was performed with the steel keratome. A continuous curvilinear capsulorrhexis was performed with a cystotome followed by the capsulorrhexis forceps. Hydrodissection and hydrodelineation were carried out with BSS on a blunt cannula. The lens was removed in a divide and conquer  technique and the remaining cortical material was removed with the irrigation-aspiration handpiece. The capsular bag was inflated with viscoelastic and the lens was placed in the capsular bag without complication. The remaining viscoelastic was removed from the eye with the irrigation-aspiration handpiece. The wounds were hydrated. The anterior chamber was flushed and the eye was inflated to physiologic pressure. 0.44ml Vigamox was placed in the anterior chamber. The wounds were found to be water tight. The eye was dressed with Vigamox. The patient was given protective glasses to wear throughout the day and a shield with which  to sleep tonight. The patient was also given drops with which to begin a drop regimen today and will follow-up with me in one day. Implant Name Type Inv. Item Serial No. Manufacturer Lot No. LRB No. Used Action  LENS IOL DIOP 15.5 - [id number] Intraocular Lens LENS IOL DIOP 15.5 [id number] [employer]  Left 1 Implanted    Procedure(s) with comments: CATARACT EXTRACTION PHACO AND INTRAOCULAR LENS PLACEMENT (IOC) (Left) - [place] 00:36.0 CDE 5.95 Fluid Pack lot # [id number]  Electronically signed: Elliot Cousin 04/13/2020 9:38 AM

## 2020-04-13 NOTE — H&P (Signed)
   I have reviewed the patient's H&P and agree with its findings. There have been no interval changes.  Tria Noguera MD Ophthalmology 

## 2020-04-13 NOTE — Discharge Instructions (Signed)
Eye Surgery Discharge Instructions    Expect mild scratchy sensation or mild soreness. DO NOT RUB YOUR EYE!  The day of surgery: . Minimal physical activity, but bed rest is not required . No reading, computer work, or close hand work . No bending, lifting, or straining. . May watch TV  For 24 hours: . No driving, legal decisions, or alcoholic beverages . Safety precautions . Eat anything you prefer: It is better to start with liquids, then soup then solid foods. . _____ Eye patch should be worn until postoperative exam tomorrow. . ____ Solar shield eyeglasses should be worn for comfort in the sunlight/patch while sleeping  Resume all regular medications including aspirin or Coumadin if these were discontinued prior to surgery. You may shower, bathe, shave, or wash your hair. Tylenol may be taken for mild discomfort.  Call your doctor if you experience significant pain, nausea, or vomiting, fever > 101 or other signs of infection. [phone] or [phone] Specific instructions:   Follow-up Information    Elliot Cousin, MD Follow up on 04/13/2020.   Specialty: Ophthalmology Why: @ 1:40 pm for post op visit Contact information: [address] [place] [address] [phone]

## 2020-05-10 ENCOUNTER — Ambulatory Visit: Payer: Medicare Other | Admitting: Student in an Organized Health Care Education/Training Program

## 2020-05-23 ENCOUNTER — Ambulatory Visit: Payer: Medicare Other | Admitting: Student in an Organized Health Care Education/Training Program

## 2020-09-19 ENCOUNTER — Other Ambulatory Visit: Payer: Self-pay

## 2020-09-19 ENCOUNTER — Emergency Department
Admission: EM | Admit: 2020-09-19 | Discharge: 2020-09-19 | Disposition: A | Discharge status: Home / Self Care | Payer: Medicare Other | Attending: Emergency Medicine | Admitting: Emergency Medicine

## 2020-09-19 DIAGNOSIS — E119 Type 2 diabetes mellitus without complications: Secondary | ICD-10-CM | POA: Diagnosis not present

## 2020-09-19 DIAGNOSIS — I10 Essential (primary) hypertension: Secondary | ICD-10-CM | POA: Insufficient documentation

## 2020-09-19 DIAGNOSIS — Z79899 Other long term (current) drug therapy: Secondary | ICD-10-CM | POA: Insufficient documentation

## 2020-09-19 DIAGNOSIS — T783XXA Angioneurotic edema, initial encounter: Secondary | ICD-10-CM | POA: Insufficient documentation

## 2020-09-19 DIAGNOSIS — J45909 Unspecified asthma, uncomplicated: Secondary | ICD-10-CM | POA: Diagnosis not present

## 2020-09-19 DIAGNOSIS — Z7984 Long term (current) use of oral hypoglycemic drugs: Secondary | ICD-10-CM | POA: Diagnosis not present

## 2020-09-19 DIAGNOSIS — K130 Diseases of lips: Secondary | ICD-10-CM | POA: Diagnosis present

## 2020-09-19 MED ORDER — DIPHENHYDRAMINE HCL 50 MG/ML IJ SOLN
25.0000 mg | Freq: Once | INTRAMUSCULAR | Status: AC
  Administered 2020-09-19: 25 mg via INTRAVENOUS
  Filled 2020-09-19: qty 1

## 2020-09-19 MED ORDER — EPINEPHRINE 0.3 MG/0.3ML IJ SOAJ: 0.3000 mg | Freq: Once | INTRAMUSCULAR | Status: AC

## 2020-09-19 MED ORDER — METHYLPREDNISOLONE SODIUM SUCC 125 MG IJ SOLR
125.0000 mg | INTRAMUSCULAR | Status: AC
  Administered 2020-09-19: 125 mg via INTRAVENOUS
  Filled 2020-09-19: qty 2

## 2020-09-19 MED ORDER — EPINEPHRINE 0.3 MG/0.3ML IJ SOAJ
INTRAMUSCULAR | Status: AC
  Administered 2020-09-19: 0.3 mg via INTRAMUSCULAR
  Filled 2020-09-19: qty 0.3

## 2020-09-19 MED ORDER — ONDANSETRON HCL 4 MG/2ML IJ SOLN
4.0000 mg | Freq: Once | INTRAMUSCULAR | Status: AC
  Administered 2020-09-19: 4 mg via INTRAVENOUS
  Filled 2020-09-19: qty 2

## 2020-09-19 MED ORDER — FAMOTIDINE IN NACL 20-0.9 MG/50ML-% IV SOLN
20.0000 mg | Freq: Once | INTRAVENOUS | Status: AC
  Administered 2020-09-19: 20 mg via INTRAVENOUS
  Filled 2020-09-19: qty 50

## 2020-09-19 MED ORDER — RACEPINEPHRINE HCL 2.25 % IN NEBU
0.5000 mL | INHALATION_SOLUTION | Freq: Once | RESPIRATORY_TRACT | Status: AC
  Administered 2020-09-19: 0.5 mL via RESPIRATORY_TRACT

## 2020-09-19 MED ORDER — MENTHOL 3 MG MT LOZG
1.0000 | LOZENGE | OROMUCOSAL | Status: DC | PRN
  Administered 2020-09-19: 3 mg via ORAL
  Filled 2020-09-19 (×2): qty 9

## 2020-09-19 MED ORDER — DIPHENHYDRAMINE HCL 50 MG PO TABS: 50.0000 mg | ORAL_TABLET | Freq: Four times a day (QID) | ORAL | 0 refills | Status: DC | PRN

## 2020-09-19 MED ORDER — FAMOTIDINE 20 MG PO TABS: 20.0000 mg | ORAL_TABLET | Freq: Two times a day (BID) | ORAL | 0 refills | Status: DC

## 2020-09-19 MED ORDER — EPINEPHRINE 0.3 MG/0.3ML IJ SOAJ: 0.3000 mg | INTRAMUSCULAR | 0 refills | Status: DC | PRN

## 2020-09-19 MED ORDER — PREDNISONE 20 MG PO TABS: 40.0000 mg | ORAL_TABLET | Freq: Every day | ORAL | 0 refills | Status: AC

## 2020-09-19 NOTE — ED Triage Notes (Signed)
Pt to ED via pov, pt states aprox 30-45 min ago after eating a new type of bacon she noticed swelling to lips and tongue. Pt states she is having difficulty breathing and swallowing. Pt able to speak in complete sentences but is drooling. Pt took benadryl 50mg  prior to arrival with no relief.

## 2020-09-19 NOTE — ED Provider Notes (Signed)
[facility] Emergency Department Provider Note  ____________________________________________   Event Date/Time   First MD Initiated Contact with Patient 09/19/20 [phone]     (approximate)  I have reviewed the triage vital signs and the nursing notes.   HISTORY  Chief Complaint Allergic Reaction    HPI Kristina Cruz is a 60 y.o. female  With h/o HTN, DM, obesity, prior angioedema rxn, here with lip and tongue swelling. Pt woke up this AM in usual state of health. She ate eggs & bacon, then began to experience diffuse body itching, lip swelling. She then noticed her tongue began swelling and has since had ongoing worsening tongue swelling. She has had associated difficulty swallowing and speaking. Mild SOB noted. She has had some emesis as well. No wheezing or stridor. Reports h/o lip swelling in past from unknown triggers and "sun exposure," but denies any new meds, exposures, etc. No alleviating factors.        Past Medical History:  Diagnosis Date  . Anemia   . Asthma   . Back pain   . Cataract   . Diabetes mellitus without complication (HCC)   . GERD (gastroesophageal reflux disease)   . Hypertension   . Morbid obesity with BMI of 60.0-69.9, adult (HCC)   . Restless leg syndrome     Patient Active Problem List   Diagnosis Date Noted  . Restless leg syndrome 12/16/2017  . Hypertension 12/16/2017  . 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 Endometrial polyp 11/11/2017  . Post-menopausal bleeding 11/11/2017  . Endometrial thickening on ultrasound 04/27/2017  . Morbid obesity (HCC) 04/27/2017    Past Surgical History:  Procedure Laterality Date  . ABDOMINAL HYSTERECTOMY    . CATARACT EXTRACTION W/PHACO Left 04/13/2020   Procedure: CATARACT EXTRACTION PHACO AND INTRAOCULAR LENS PLACEMENT (IOC);  Surgeon: Elliot Cousin, MD;  Location: [facility] ORS;  Service: Ophthalmology;  Laterality: Left;  [place] 00:36.0 CDE 5.95 Fluid Pack  lot # [id number]  . HYSTEROSCOPY WITH D & C N/A 07/14/2017   Procedure: DILATATION AND CURETTAGE /HYSTEROSCOPY;  Surgeon: Nadara Mustard, MD;  Location: [facility] ORS;  Service: Gynecology;  Laterality: N/A;  . POLYPECTOMY  2015    Prior to Admission medications   Medication Sig Start Date End Date Taking? Authorizing Provider  diphenhydrAMINE (BENADRYL) 50 MG tablet Take 1 tablet (50 mg total) by mouth every 6 (six) hours as needed for itching (swelling). 09/19/20  Yes Shaune Pollack, MD  EPINEPHrine 0.3 mg/0.3 mL IJ SOAJ injection Inject 0.3 mg into the muscle as needed for anaphylaxis. 09/19/20  Yes Shaune Pollack, MD  famotidine (PEPCID) 20 MG tablet Take 1 tablet (20 mg total) by mouth 2 (two) times daily for 3 days. 09/19/20 09/22/20 Yes Shaune Pollack, MD  predniSONE (DELTASONE) 20 MG tablet Take 2 tablets (40 mg total) by mouth daily for 3 days. 09/19/20 09/22/20 Yes Shaune Pollack, MD  acetaminophen (TYLENOL 8 HOUR ARTHRITIS PAIN) 650 MG CR tablet Take 1,950 mg by mouth every 8 (eight) hours as needed for pain.    [provider]  amLODipine (NORVASC) 5 MG tablet Take 5 mg by mouth daily.    [provider]  baclofen (LIORESAL) 10 MG tablet Take 10 mg by mouth 3 (three) times daily.    [provider]  furosemide (LASIX) 20 MG tablet Take 1 tablet (20 mg total) by mouth daily. 01/15/17 04/13/20  Irean Hong, MD  furosemide (LASIX) 40 MG tablet Take 40 mg by mouth daily.  03/23/20   [provider]  metFORMIN (GLUCOPHAGE) 500 MG tablet Take 500 mg by mouth 2 (two) times daily.  07/01/17   [provider]  ranitidine (ZANTAC) 150 MG tablet Take 1 tablet (150 mg total) by mouth 2 (two) times daily. Patient not taking: Reported on 04/09/2020 09/04/18 09/04/19  Arnaldo Natal, MD  rOPINIRole (REQUIP) 2 MG tablet Take 2 mg by mouth in the morning and at bedtime.     [provider]  traMADol (ULTRAM) 50 MG tablet Take 1 tablet (50 mg total) by mouth every 6  (six) hours as needed. Patient not taking: Reported on 04/09/2020 09/04/19   Minna Antis, MD  Monte Fantasia INHUB 100-50 MCG/DOSE AEPB Inhale 1 puff into the lungs 2 (two) times daily as needed for shortness of breath. 03/20/20   [provider]    Allergies Contrast media [iodinated diagnostic agents]  Family History  Problem Relation Age of Onset  . Breast cancer Mother 65  . Diabetes Mother   . Hypertension Mother   . Ovarian cancer Paternal Aunt        ?  . Diabetes Father   . Hypertension Father     Social History Social History   Tobacco Use  . Smoking status: Never Smoker  . Smokeless tobacco: Never Used  Vaping Use  . Vaping Use: Never used  Substance Use Topics  . Alcohol use: No  . Drug use: No    Review of Systems  Review of Systems  Constitutional: Positive for fatigue. Negative for chills and fever.  HENT: Positive for facial swelling and trouble swallowing. Negative for sore throat.   Respiratory: Negative for shortness of breath.   Cardiovascular: Negative for chest pain.  Gastrointestinal: Positive for nausea and vomiting. Negative for abdominal pain.  Genitourinary: Negative for flank pain.  Musculoskeletal: Negative for neck pain.  Skin: Negative for rash and wound.  Allergic/Immunologic: Negative for immunocompromised state.  Neurological: Negative for weakness and numbness.  Hematological: Does not bruise/bleed easily.     ____________________________________________  PHYSICAL EXAM:      VITAL SIGNS: ED Triage Vitals  Enc Vitals Group     BP 09/19/20 0634 (!) 159/56     Pulse Rate 09/19/20 0634 81     Resp 09/19/20 0634 (!) 21     Temp 09/19/20 0634 98.1 F (36.7 C)     Temp Source 09/19/20 0634 Oral     SpO2 09/19/20 0634 100 %     Weight 09/19/20 0629 (!) 335 lb (152 kg)     Height 09/19/20 0629 5\' 5"  (1.651 m)     Head Circumference --      Peak Flow --      Pain Score 09/19/20 0629 0     Pain Loc --      Pain Edu? --       Excl. in [place]? --      Physical Exam Vitals and nursing note reviewed.  Constitutional:      General: She is not in acute distress.    Appearance: She is well-developed and well-nourished.  HENT:     Head: Normocephalic and atraumatic.     Comments: Marked tongue edema, more pronounced along anterior tongue. Muffled voice. Mild upper and lower lip edema.  Eyes:     Conjunctiva/sclera: Conjunctivae normal.  Neck:     Comments: No stridor. Cardiovascular:     Rate and Rhythm: Normal rate and regular rhythm.     Heart sounds: Normal  heart sounds.  Pulmonary:     Effort: Pulmonary effort is normal. No respiratory distress.     Breath sounds: No wheezing.  Abdominal:     General: There is no distension.  Musculoskeletal:        General: No edema.     Cervical back: Neck supple.  Skin:    General: Skin is warm.     Capillary Refill: Capillary refill takes less than 2 seconds.     Findings: No rash.  Neurological:     Mental Status: She is alert and oriented to person, place, and time.     Motor: No abnormal muscle tone.       ____________________________________________   LABS (all labs ordered are listed, but only abnormal results are displayed)  Labs Reviewed - No data to display  ____________________________________________  EKG: Normal sinus rhythm, VR 86. QRS 158, QTc 492. RBBB and  LAFB. LVH. No acute ST eelvations. ________________________________________  RADIOLOGY All imaging, including plain films, CT scans, and ultrasounds, independently reviewed by me, and interpretations confirmed via formal radiology reads.  ED MD interpretation:   None  Official radiology report(s): No results found.  ____________________________________________  PROCEDURES   Procedure(s) performed (including Critical Care):  .Critical Care Performed by: Shaune Pollack, MD Authorized by: Shaune Pollack, MD   Critical care provider statement:    Critical care time  (minutes):  35   Critical care time was exclusive of:  Separately billable procedures and treating other patients and teaching time   Critical care was necessary to treat or prevent imminent or life-threatening deterioration of the following conditions:  Circulatory failure and respiratory failure   Critical care was time spent personally by me on the following activities:  Development of treatment plan with patient or surrogate, discussions with consultants, evaluation of patient's response to treatment, examination of patient, obtaining history from patient or surrogate, ordering and performing treatments and interventions, ordering and review of laboratory studies, ordering and review of radiographic studies, pulse oximetry, re-evaluation of patient's condition and review of old charts   I assumed direction of critical care for this patient from another provider in my specialty: no      ____________________________________________  INITIAL IMPRESSION / MDM / ASSESSMENT AND PLAN / ED COURSE  As part of my medical decision making, I reviewed the following data within the electronic MEDICAL RECORD NUMBER Nursing notes reviewed and incorporated, Old chart reviewed, Notes from prior ED visits, and [place] Controlled Substance Database       *Nakkia Mackiewicz was evaluated in Emergency Department on 09/19/2020 for the symptoms described in the history of present illness. She was evaluated in the context of the global COVID-19 pandemic, which necessitated consideration that the patient might be at risk for infection with the SARS-CoV-2 virus that causes COVID-19. Institutional protocols and algorithms that pertain to the evaluation of patients at risk for COVID-19 are in a state of rapid change based on information released by regulatory bodies including the CDC and federal and state organizations. These policies and algorithms were followed during the patient's care in the ED.  Some ED evaluations and  interventions may be delayed as a result of limited staffing during the pandemic.*     Medical Decision Making:  60 yo F here with angioedema of tongue. 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 She is satting well on RA but with marked tongue edema on exam, protecting airway. IV steroids, antihistamines, and epipen given. Pt did have itching c/f possible  allergic rxn, though idiopathic angioedema also a consideration. She is not on an ACE or ARB. After epipen, pt monitored >4 hr with slow but gradual resolution of her edema. Airway remained patent. She is now tolerating PO and appears well.  Will have her throw the food away that she was eating, keep an allergen diet, and f/u with allergen specialist. Continue to avoid ACE/ARB or any new meds.  ____________________________________________  FINAL CLINICAL IMPRESSION(S) / ED DIAGNOSES  Final diagnoses:  Angioedema, initial encounter     MEDICATIONS GIVEN DURING THIS VISIT:  Medications  menthol-cetylpyridinium (CEPACOL) lozenge 3 mg (3 mg Oral Given 09/19/20 1150)  methylPREDNISolone sodium succinate (SOLU-MEDROL) 125 mg/2 mL injection 125 mg (125 mg Intravenous Given 09/19/20 0647)  famotidine (PEPCID) IVPB 20 mg premix (0 mg Intravenous Stopped 09/19/20 0721)  diphenhydrAMINE (BENADRYL) injection 25 mg (25 mg Intravenous Given 09/19/20 0645)  ondansetron (ZOFRAN) injection 4 mg (4 mg Intravenous Given 09/19/20 0650)  EPINEPHrine (EPI-PEN) injection 0.3 mg (0.3 mg Intramuscular Given 09/19/20 0719)  Racepinephrine HCl 2.25 % nebulizer solution 0.5 mL (0.5 mLs Nebulization Given 09/19/20 0720)  ondansetron (ZOFRAN) injection 4 mg (4 mg Intravenous Given 09/19/20 0735)  diphenhydrAMINE (BENADRYL) injection 25 mg (25 mg Intravenous Given 09/19/20 0839)  famotidine (PEPCID) IVPB 20 mg premix (0 mg Intravenous Stopped 09/19/20 1150)     ED Discharge Orders         Ordered    predniSONE (DELTASONE) 20 MG tablet  Daily        09/19/20 1211     diphenhydrAMINE (BENADRYL) 50 MG tablet  Every 6 hours PRN        09/19/20 1211    famotidine (PEPCID) 20 MG tablet  2 times daily        09/19/20 1211    EPINEPHrine 0.3 mg/0.3 mL IJ SOAJ injection  As needed        09/19/20 1211           Note:  This document was prepared using Dragon voice recognition software and may include unintentional dictation errors.   Shaune Pollack, MD 09/19/20 [phone]

## 2020-09-19 NOTE — Discharge Instructions (Addendum)
Throw away the food that you ate this morning, and avoid them in the future due to possible allergy  For 3 days, take benadryl 25 mg every 6 hours, pepcid twice a day, and prednisone once daily  I'd recommend following up with an allergy specialist  Try taking ZYRTEC/LORATADINE or other antihistamine daily to help prevent this - these can be purchased over-the-counter and taken daily

## 2020-09-19 NOTE — ED Notes (Signed)
NAD noted, RR even and unlabored

## 2020-09-19 NOTE — ED Notes (Signed)
Pt resting at this time, RR even and unlabored, NAD noted.  °

## 2020-09-19 NOTE — ED Notes (Signed)
Upon this RN assessing pt, pt found to have lip swelling, drooling, and speech difficult to understand. Dr Erma Heritage to bedside and orders placed and administered.

## 2020-09-19 NOTE — ED Notes (Signed)
Pt actively vomiting at this time.

## 2020-09-19 NOTE — ED Notes (Signed)
Pt reports having to "tilt head all the way back to drink water" and is having to wet crackers to get them to go down. Swelling to lips appears lessened. Dr Erma Heritage notified

## 2020-09-19 NOTE — ED Notes (Signed)
Pt provided ice water and graham crackers for PO challenge

## 2020-12-13 ENCOUNTER — Ambulatory Visit: Payer: Medicare Other | Admitting: Podiatry

## 2020-12-27 ENCOUNTER — Ambulatory Visit: Payer: Medicare Other | Admitting: Podiatry

## 2021-10-29 ENCOUNTER — Other Ambulatory Visit: Payer: Self-pay | Admitting: Student

## 2021-10-29 DIAGNOSIS — Z1231 Encounter for screening mammogram for malignant neoplasm of breast: Secondary | ICD-10-CM

## 2021-11-07 ENCOUNTER — Emergency Department: Payer: Medicare Other

## 2021-11-07 ENCOUNTER — Encounter: Payer: Self-pay | Admitting: Intensive Care

## 2021-11-07 ENCOUNTER — Emergency Department
Admission: EM | Admit: 2021-11-07 | Discharge: 2021-11-07 | Disposition: A | Discharge status: Home / Self Care | Payer: Medicare Other | Attending: Emergency Medicine | Admitting: Emergency Medicine

## 2021-11-07 ENCOUNTER — Other Ambulatory Visit: Payer: Self-pay

## 2021-11-07 DIAGNOSIS — I10 Essential (primary) hypertension: Secondary | ICD-10-CM | POA: Insufficient documentation

## 2021-11-07 DIAGNOSIS — R791 Abnormal coagulation profile: Secondary | ICD-10-CM | POA: Diagnosis not present

## 2021-11-07 DIAGNOSIS — E119 Type 2 diabetes mellitus without complications: Secondary | ICD-10-CM | POA: Insufficient documentation

## 2021-11-07 DIAGNOSIS — H532 Diplopia: Secondary | ICD-10-CM | POA: Insufficient documentation

## 2021-11-07 DIAGNOSIS — R519 Headache, unspecified: Secondary | ICD-10-CM | POA: Diagnosis not present

## 2021-11-07 DIAGNOSIS — G589 Mononeuropathy, unspecified: Secondary | ICD-10-CM

## 2021-11-07 LAB — DIFFERENTIAL
Abs Immature Granulocytes: 0.03 10*3/uL (ref 0.00–0.07)
Basophils Absolute: 0.1 10*3/uL (ref 0.0–0.1)
Basophils Relative: 1 %
Eosinophils Absolute: 0.3 10*3/uL (ref 0.0–0.5)
Eosinophils Relative: 4 %
Immature Granulocytes: 0 %
Lymphocytes Relative: 26 %
Lymphs Abs: 1.9 10*3/uL (ref 0.7–4.0)
Monocytes Absolute: 0.6 10*3/uL (ref 0.1–1.0)
Monocytes Relative: 7 %
Neutro Abs: 4.6 10*3/uL (ref 1.7–7.7)
Neutrophils Relative %: 62 %

## 2021-11-07 LAB — COMPREHENSIVE METABOLIC PANEL
ALT: 21 U/L (ref 0–44)
AST: 18 U/L (ref 15–41)
Albumin: 3.8 g/dL (ref 3.5–5.0)
Alkaline Phosphatase: 76 U/L (ref 38–126)
Anion gap: 12 (ref 5–15)
BUN: 11 mg/dL (ref 8–23)
CO2: 27 mmol/L (ref 22–32)
Calcium: 9.1 mg/dL (ref 8.9–10.3)
Chloride: 101 mmol/L (ref 98–111)
Creatinine, Ser: 0.78 mg/dL (ref 0.44–1.00)
GFR, Estimated: >60 mL/min (ref >60)
Glucose, Bld: 147 mg/dL — ABNORMAL HIGH (ref 70–99)
Potassium: 3.3 mmol/L — ABNORMAL LOW (ref 3.5–5.1)
Sodium: 140 mmol/L (ref 135–145)
Total Bilirubin: 0.6 mg/dL (ref 0.3–1.2)
Total Protein: 8.3 g/dL — ABNORMAL HIGH (ref 6.5–8.1)

## 2021-11-07 LAB — CBC
HCT: 41.1 % (ref 36.0–46.0)
Hemoglobin: 12.7 g/dL (ref 12.0–15.0)
MCH: 26.8 pg (ref 26.0–34.0)
MCHC: 30.9 g/dL (ref 30.0–36.0)
MCV: 86.7 fL (ref 80.0–100.0)
Platelets: 353 10*3/uL (ref 150–400)
RBC: 4.74 MIL/uL (ref 3.87–5.11)
RDW: 15 % (ref 11.5–15.5)
WBC: 7.5 10*3/uL (ref 4.0–10.5)
nRBC: 0 % (ref 0.0–0.2)

## 2021-11-07 LAB — PROTIME-INR
INR: 1.1 (ref 0.8–1.2)
Prothrombin Time: 14.2 seconds (ref 11.4–15.2)

## 2021-11-07 LAB — APTT: aPTT: 33 seconds (ref 24–36)

## 2021-11-07 LAB — CBG MONITORING, ED: Glucose-Capillary: 143 mg/dL — ABNORMAL HIGH (ref 70–99)

## 2021-11-07 MED ORDER — BUTALBITAL-APAP-CAFFEINE 50-325-40 MG PO TABS
2.0000 | ORAL_TABLET | Freq: Once | ORAL | Status: AC
  Administered 2021-11-07: 2 via ORAL
  Filled 2021-11-07: qty 2

## 2021-11-07 MED ORDER — BUTALBITAL-APAP-CAFFEINE 50-325-40 MG PO TABS: 1.0000 | ORAL_TABLET | Freq: Four times a day (QID) | ORAL | 0 refills | Status: AC | PRN

## 2021-11-07 NOTE — ED Triage Notes (Signed)
Patient c/o left eye blurred vision and headache since Monday evening. Neither symptom has subsided. No weakness. No facial droop noted. Grips, strong and equal. Hx diabetes  ?

## 2021-11-07 NOTE — Discharge Instructions (Signed)
Please follow-up with ophthalmology by calling the number provided.  Please wear your eye patch during the day to help with discomfort/double vision.  Please follow-up with your doctor.  Return to the emergency department for any worsening vision, any weakness or numbness of any arm or leg confusion, trouble speaking or any other symptom personally concerning to yourself. ?

## 2021-11-07 NOTE — ED Notes (Addendum)
Pt. To MRI

## 2021-11-07 NOTE — ED Provider Notes (Signed)
? ?[facility] ?Provider Note ? ? ? Event Date/Time  ? First MD Initiated Contact with Patient 11/07/21 1149   ?  (approximate) ? ?History  ? ?Chief Complaint: Blurred Vision and Headache ? ?HPI ? ?Kristina Cruz is a 61 y.o. female with a past medical history of anemia, diabetes, gastric reflux, hypertension, presents to the emergency department for double vision.  According to the patient for the past 1 week she has had a headache as well as some slight nasal congestion.  Patient thought she had a sinus headache however this morning she awoke with double vision.  No history of double vision previously.  Patient denies any weakness or numbness of any arm or leg confusion or slurred speech.  No other medical complaints today. ? ?Physical Exam  ? ?Triage Vital Signs: ?ED Triage Vitals  ?Enc Vitals Group  ?   BP 11/07/21 1116 (!) 187/82  ?   Pulse Rate 11/07/21 1116 71  ?   Resp 11/07/21 1116 18  ?   Temp 11/07/21 1116 98.4 ?F (36.9 ?C)  ?   Temp Source 11/07/21 1116 Oral  ?   SpO2 11/07/21 1116 99 %  ?   Weight 11/07/21 1117 (!) 340 lb (154.2 kg)  ?   Height 11/07/21 1117 5\' 4"  (1.626 m)  ?   Head Circumference --   ?   Peak Flow --   ?   Pain Score 11/07/21 1117 10  ?   Pain Loc --   ?   Pain Edu? --   ?   Excl. in GC? --   ? ? ?Most recent vital signs: ?Vitals:  ? 11/07/21 1116  ?BP: (!) 187/82  ?Pulse: 71  ?Resp: 18  ?Temp: 98.4 ?F (36.9 ?C)  ?SpO2: 99%  ? ? ?General: Awake, no distress.  ?CV:  Good peripheral perfusion.  Regular rate and rhythm  ?Resp:  Normal effort.  Equal breath sounds bilaterally.  ?Abd:  No distention.  Soft, nontender.  No rebound or guarding. 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  No other neurologic findings. ? ? ?ED Results / Procedures / Treatments  ? ?EKG ? ?EKG viewed and interpreted by myself shows a normal sinus rhythm at 69 bpm with a slightly widened QRS, left axis deviation, largely normal intervals with nonspecific  but no concerning ST changes. ? ?RADIOLOGY ? ?I have personally reviewed the CT images of the head, no significant acute abnormality seen on my evaluation. ?Radiology is read the CT scan of the head and face is negative for acute abnormality. ? ? ?MEDICATIONS ORDERED IN ED: ?Medications  ?butalbital-acetaminophen-caffeine (FIORICET) 50-325-40 MG per tablet 2 tablet (has no administration in time range)  ? ? ? ?IMPRESSION / MDM / ASSESSMENT AND PLAN / ED COURSE  ?I reviewed the triage vital signs and the nursing notes. ? ?Patient presents to the emergency department for double vision.  Patient states a headache over the past 1 week which she assumed was a sinus headache.  Overall the patient appears well, no distress.  On examination she does have a lateral palsy of the left eye only on examination.  No other significant findings on my exam.  Patient's work-up so far is reassuring.  CT scan of the head and face are negative.  EKG shows no significant findings.  Lab work shows a normal CBC, fairly normal chemistry besides mild hyperglycemia at 147.  Given the patient's examination findings and  complaints I spoke to neurology, they recommend obtaining an MR of the brain, MRA of the brain as well as an MRV of the brain all without contrast.  Once MR imaging is completed we will discuss further with neurology.  Patient is agreeable to plan of care.  Differential would include CVA, mass/tumor, diabetic nerve palsy. ? ?Neurology has seen the MRIs.  MRIs are normal.  No acute abnormality.  Neurology recommends discharging with an eye patch as well as ophthalmology follow-up.  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  Discussed this with the patient is agreeable to plan of care.  We will also discharge with Fioricet to be taken as needed for headache. ? ?FINAL CLINICAL IMPRESSION(S) / ED DIAGNOSES  ? ?[place] ? ? ?Note:  This document was prepared using Dragon voice recognition software and may include unintentional dictation  errors. ?  ?Minna Antis, MD ?11/07/21 1504 ? ?

## 2022-07-17 ENCOUNTER — Other Ambulatory Visit: Payer: Self-pay | Admitting: Orthopedic Surgery

## 2022-07-17 DIAGNOSIS — M4807 Spinal stenosis, lumbosacral region: Secondary | ICD-10-CM

## 2022-07-17 DIAGNOSIS — G8929 Other chronic pain: Secondary | ICD-10-CM

## 2022-07-29 ENCOUNTER — Ambulatory Visit
Admission: RE | Admit: 2022-07-29 | Discharge: 2022-07-29 | Disposition: A | Discharge status: Home / Self Care | Payer: Medicare Other | Source: Ambulatory Visit | Attending: Orthopedic Surgery | Admitting: Orthopedic Surgery

## 2022-07-29 DIAGNOSIS — M549 Dorsalgia, unspecified: Secondary | ICD-10-CM | POA: Diagnosis present

## 2022-07-29 DIAGNOSIS — G8929 Other chronic pain: Secondary | ICD-10-CM | POA: Insufficient documentation

## 2022-07-29 DIAGNOSIS — M4807 Spinal stenosis, lumbosacral region: Secondary | ICD-10-CM | POA: Diagnosis not present

## 2022-08-22 ENCOUNTER — Other Ambulatory Visit: Payer: Self-pay | Admitting: Family Medicine

## 2022-08-22 DIAGNOSIS — Z1231 Encounter for screening mammogram for malignant neoplasm of breast: Secondary | ICD-10-CM

## 2022-10-16 ENCOUNTER — Emergency Department
Admission: EM | Admit: 2022-10-16 | Discharge: 2022-10-17 | Disposition: A | Discharge status: Home / Self Care | Payer: 59 | Attending: Emergency Medicine | Admitting: Emergency Medicine

## 2022-10-16 ENCOUNTER — Other Ambulatory Visit: Payer: Self-pay

## 2022-10-16 ENCOUNTER — Emergency Department: Payer: 59

## 2022-10-16 DIAGNOSIS — R112 Nausea with vomiting, unspecified: Secondary | ICD-10-CM | POA: Insufficient documentation

## 2022-10-16 DIAGNOSIS — R1084 Generalized abdominal pain: Secondary | ICD-10-CM | POA: Insufficient documentation

## 2022-10-16 DIAGNOSIS — T819XXA Unspecified complication of procedure, initial encounter: Secondary | ICD-10-CM | POA: Insufficient documentation

## 2022-10-16 LAB — CBC WITH DIFFERENTIAL/PLATELET
Abs Immature Granulocytes: 0.11 10*3/uL — ABNORMAL HIGH (ref 0.00–0.07)
Basophils Absolute: 0 10*3/uL (ref 0.0–0.1)
Basophils Relative: 0 %
Eosinophils Absolute: 0.1 10*3/uL (ref 0.0–0.5)
Eosinophils Relative: 1 %
HCT: 40.6 % (ref 36.0–46.0)
Hemoglobin: 12.9 g/dL (ref 12.0–15.0)
Immature Granulocytes: 1 %
Lymphocytes Relative: 13 %
Lymphs Abs: 1.4 10*3/uL (ref 0.7–4.0)
MCH: 27.6 pg (ref 26.0–34.0)
MCHC: 31.8 g/dL (ref 30.0–36.0)
MCV: 86.8 fL (ref 80.0–100.0)
Monocytes Absolute: 0.3 10*3/uL (ref 0.1–1.0)
Monocytes Relative: 3 %
Neutro Abs: 8.6 10*3/uL — ABNORMAL HIGH (ref 1.7–7.7)
Neutrophils Relative %: 82 %
Platelets: 396 10*3/uL (ref 150–400)
RBC: 4.68 MIL/uL (ref 3.87–5.11)
RDW: 15.4 % (ref 11.5–15.5)
WBC: 10.6 10*3/uL — ABNORMAL HIGH (ref 4.0–10.5)
nRBC: 0 % (ref 0.0–0.2)

## 2022-10-16 LAB — URINALYSIS, ROUTINE W REFLEX MICROSCOPIC
Bilirubin Urine: NEGATIVE
Glucose, UA: 50 mg/dL — AB
Hgb urine dipstick: NEGATIVE
Ketones, ur: 20 mg/dL — AB
Leukocytes,Ua: NEGATIVE
Nitrite: NEGATIVE
Protein, ur: 30 mg/dL — AB
Specific Gravity, Urine: 1.018 (ref 1.005–1.030)
pH: 5 (ref 5.0–8.0)

## 2022-10-16 LAB — COMPREHENSIVE METABOLIC PANEL
ALT: 15 U/L (ref 0–44)
AST: 21 U/L (ref 15–41)
Albumin: 4.1 g/dL (ref 3.5–5.0)
Alkaline Phosphatase: 92 U/L (ref 38–126)
Anion gap: 16 — ABNORMAL HIGH (ref 5–15)
BUN: 17 mg/dL (ref 8–23)
CO2: 20 mmol/L — ABNORMAL LOW (ref 22–32)
Calcium: 9.1 mg/dL (ref 8.9–10.3)
Chloride: 102 mmol/L (ref 98–111)
Creatinine, Ser: 0.88 mg/dL (ref 0.44–1.00)
GFR, Estimated: >60 mL/min (ref >60)
Glucose, Bld: 222 mg/dL — ABNORMAL HIGH (ref 70–99)
Potassium: 3.4 mmol/L — ABNORMAL LOW (ref 3.5–5.1)
Sodium: 138 mmol/L (ref 135–145)
Total Bilirubin: 0.7 mg/dL (ref 0.3–1.2)
Total Protein: 8.3 g/dL — ABNORMAL HIGH (ref 6.5–8.1)

## 2022-10-16 MED ORDER — MORPHINE SULFATE (PF) 4 MG/ML IV SOLN
4.0000 mg | Freq: Once | INTRAVENOUS | Status: AC
  Administered 2022-10-16: 4 mg via INTRAVENOUS
  Filled 2022-10-16: qty 1

## 2022-10-16 MED ORDER — ONDANSETRON 4 MG PO TBDP
4.0000 mg | ORAL_TABLET | Freq: Once | ORAL | Status: AC
  Administered 2022-10-16: 4 mg via ORAL
  Filled 2022-10-16: qty 1

## 2022-10-16 MED ORDER — ALUM & MAG HYDROXIDE-SIMETH 200-200-20 MG/5ML PO SUSP
30.0000 mL | Freq: Once | ORAL | Status: AC
  Administered 2022-10-16: 30 mL via ORAL
  Filled 2022-10-16: qty 30

## 2022-10-16 MED ORDER — SODIUM CHLORIDE 0.9 % IV BOLUS
1000.0000 mL | Freq: Once | INTRAVENOUS | Status: AC
  Administered 2022-10-16: 1000 mL via INTRAVENOUS

## 2022-10-16 MED ORDER — FAMOTIDINE IN NACL 20-0.9 MG/50ML-% IV SOLN
20.0000 mg | Freq: Once | INTRAVENOUS | Status: AC
  Administered 2022-10-16: 20 mg via INTRAVENOUS
  Filled 2022-10-16: qty 50

## 2022-10-16 MED ORDER — ONDANSETRON HCL 4 MG/2ML IJ SOLN
4.0000 mg | Freq: Once | INTRAMUSCULAR | Status: AC
  Administered 2022-10-16: 4 mg via INTRAVENOUS
  Filled 2022-10-16: qty 2

## 2022-10-16 NOTE — ED Triage Notes (Signed)
Pt reports had an epidural today at [facility] pain clinic today for chronic pain. Epidural was administered ~1430. Pt reports since then she has been dizzy, weak, and experiencing n/v. Pt has not yet taken prescribed zofran. Pt typically ambulatory with walker at home. Presents to triage using wheelchair. Pt alert and oriented in triage. Breathing unlabored and speaking in full sentences.

## 2022-10-16 NOTE — ED Provider Notes (Signed)
[facility] Provider Note    Event Date/Time   First MD Initiated Contact with Patient 10/16/22 2145     (approximate)   History   Post-op Problem   HPI  Kristina Cruz is a 62 y.o. female  here with nausea, vomiting, abdominal pain. Pt reports that she had an epidural injection at [facility] earlier today. After being wheeled out of the procedure room about 20 minutes later, she began to develop aching, gnawing epigastric discomfort and severe nausea with vomiting, which has persisted since onset. She says she felt "great" prior to the procedure. She was given steroisd and lidoacine but no systemic meds otherwise. No analgesia. Reports she ahs had persistent n/v since then as well as a loose stool or two. She reports aching, gnawing, epigastric pain with this. She has never had an injection like this before. She does state she's been taking a lot of asa for her pain but stopped this over the past week.       Physical Exam   Triage Vital Signs: ED Triage Vitals  Enc Vitals Group     BP 10/16/22 1926 (!) 140/99     Pulse Rate 10/16/22 1926 (!) 104     Resp 10/16/22 1926 19     Temp 10/16/22 1926 97.6 F (36.4 C)     Temp Source 10/16/22 1926 Oral     SpO2 10/16/22 1926 98 %     Weight 10/16/22 1924 300 lb (136.1 kg)     Height 10/16/22 1924 '5\' 4"'$  (1.626 m)     Head Circumference --      Peak Flow --      Pain Score 10/16/22 1924 8     Pain Loc --      Pain Edu? --      Excl. in [place]? --     Most recent vital signs: Vitals:   10/16/22 1926  BP: (!) 140/99  Pulse: (!) 104  Resp: 19  Temp: 97.6 F (36.4 C)  SpO2: 98%     General: Awake, no distress.  CV:  Good peripheral perfusion. RRR. Resp:  Normal work of breathing. Lungs clear bilaterally. Abd:  No distention. Moderate diffuse TTP, worse in epigastric area. No rebound or guarding. Other:  No LE edema. No skin rash/bleeding/changes. Strength 5/5 bl LE and normal sensation to light  touch.   ED Results / Procedures / Treatments   Labs (all labs ordered are listed, but only abnormal results are displayed) Labs Reviewed  CBC WITH DIFFERENTIAL/PLATELET - Abnormal; Notable for the following components:      Result Value   WBC 10.6 (*)    Neutro Abs 8.6 (*)    Abs Immature Granulocytes 0.11 (*)    All other components within normal limits  COMPREHENSIVE METABOLIC PANEL - Abnormal; Notable for the following components:   Potassium 3.4 (*)    CO2 20 (*)    Glucose, Bld 222 (*)    Total Protein 8.3 (*)    Anion gap 16 (*)    All other components within normal limits  LIPASE, BLOOD  LACTIC ACID, PLASMA  LACTIC ACID, PLASMA  URINALYSIS, ROUTINE W REFLEX MICROSCOPIC  TROPONIN I (HIGH SENSITIVITY)     EKG Normal sinus rhythm, VR 74. PR 188, QRS 155, QTc 444. No acute ST elevations. Non specific TW changes.   RADIOLOGY CT A/P: Pending   I also independently reviewed and agree with radiologist interpretations.   PROCEDURES:  Critical Care performed:  No  .1-3 Lead EKG Interpretation  Performed by: Duffy Bruce, MD Authorized by: Duffy Bruce, MD     Interpretation: normal     ECG rate:  90-110   ECG rate assessment: normal     Rhythm: sinus rhythm     Ectopy: none     Conduction: normal   Comments:     Indication: Nausea, vomiting, epigastric pain     MEDICATIONS ORDERED IN ED: Medications  sodium chloride 0.9 % bolus 1,000 mL (has no administration in time range)  famotidine (PEPCID) IVPB 20 mg premix (has no administration in time range)  alum & mag hydroxide-simeth (MAALOX/MYLANTA) 200-200-20 MG/5ML suspension 30 mL (has no administration in time range)  ondansetron (ZOFRAN) injection 4 mg (has no administration in time range)  morphine (PF) 4 MG/ML injection 4 mg (has no administration in time range)  ondansetron (ZOFRAN-ODT) disintegrating tablet 4 mg (4 mg Oral Given 10/16/22 1931)     IMPRESSION / MDM / ASSESSMENT AND PLAN / ED  COURSE  I reviewed the triage vital signs and the nursing notes.                              Differential diagnosis includes, but is not limited to, adverse reaction to epidural/steroids, gastritis, PUD, gastroparesis, obstruction, food-borne illness or viral GI illness.  Patient's presentation is most consistent with acute presentation with potential threat to life or bodily function.  The patient is on the cardiac monitor to evaluate for evidence of arrhythmia and/or significant heart rate changes   62 yo F here with nausea, vomiting, abdominal pain after epidural injection earlier today. Suspect possible acute on chronic gastritis or gastroparesis in setting of steroid injection, versus incidental viral GI illness, enteritis. EKG nonischemic, doubt ACS but trop is pending. Back pain is chronic and not acutely worsened, and she has no HA, photophobia, ro signs to suggest epidural hematoma, CSF leak, or other more immediate complications related to the injection. Will check CT A/P, labs, urine, and reassess.   FINAL CLINICAL IMPRESSION(S) / ED DIAGNOSES   Final diagnoses:  Nausea and vomiting, unspecified vomiting type  Abdominal pain, generalized     Rx / DC Orders   ED Discharge Orders     None        Note:  This document was prepared using Dragon voice recognition software and may include unintentional dictation errors.   Duffy Bruce, MD 10/16/22 2325

## 2022-10-16 NOTE — ED Notes (Signed)
Patient in stretcher, pillow and warm blankets provided. Request for water denied at this time r/t emesis, new emesis bag provided

## 2022-10-17 DIAGNOSIS — R112 Nausea with vomiting, unspecified: Secondary | ICD-10-CM | POA: Diagnosis not present

## 2022-10-17 LAB — LIPASE, BLOOD: Lipase: 24 U/L (ref 11–51)

## 2022-10-17 LAB — LACTIC ACID, PLASMA
Lactic Acid, Venous: 2.3 mmol/L (ref 0.5–1.9)
Lactic Acid, Venous: 1.4 mmol/L (ref 0.5–1.9)

## 2022-10-17 LAB — TROPONIN I (HIGH SENSITIVITY): Troponin I (High Sensitivity): 7 ng/L (ref <18)

## 2022-10-17 MED ORDER — ONDANSETRON 4 MG PO TBDP: 4.0000 mg | ORAL_TABLET | Freq: Three times a day (TID) | ORAL | 0 refills | Status: DC | PRN

## 2022-10-17 MED ORDER — SODIUM CHLORIDE 0.9 % IV BOLUS
1000.0000 mL | Freq: Once | INTRAVENOUS | Status: AC
  Administered 2022-10-17: 1000 mL via INTRAVENOUS

## 2022-10-17 NOTE — ED Provider Notes (Signed)
Patient care assumed from Dr. Ellender Hose.  Patient's lactic acid noted to be elevated to 2.3.  Patient has received fluids.  Patient states she is feeling much better no longer feeling nauseated in fact is feeling hungry.  After IV fluids repeat lactic is down to 1.4.  Remainder the patient's labs are largely nonrevealing including normal urinalysis reassuring CBC reassuring chemistry besides slight anion gap elevation however the patient has not received 2 L of fluid.  CT scan of the abdomen pelvis is negative.  Given the patient's reassuring workup as she is feeling much better we will discharge with PCP follow-up.  Patient agreeable to plan.   Harvest Dark, MD 10/17/22 [phone]

## 2023-02-10 ENCOUNTER — Emergency Department: Payer: 59

## 2023-02-10 ENCOUNTER — Inpatient Hospital Stay
Admission: EM | Admit: 2023-02-10 | Discharge: 2023-02-13 | DRG: 202 | Disposition: A | Discharge status: Home Health | Payer: 59 | Attending: Osteopathic Medicine | Admitting: Osteopathic Medicine

## 2023-02-10 ENCOUNTER — Encounter: Payer: Self-pay | Admitting: Emergency Medicine

## 2023-02-10 ENCOUNTER — Other Ambulatory Visit: Payer: Self-pay

## 2023-02-10 ENCOUNTER — Inpatient Hospital Stay: Payer: 59

## 2023-02-10 DIAGNOSIS — Z6841 Body Mass Index (BMI) 40.0 and over, adult: Secondary | ICD-10-CM | POA: Diagnosis not present

## 2023-02-10 DIAGNOSIS — E639 Nutritional deficiency, unspecified: Secondary | ICD-10-CM | POA: Diagnosis present

## 2023-02-10 DIAGNOSIS — I10 Essential (primary) hypertension: Secondary | ICD-10-CM | POA: Diagnosis present

## 2023-02-10 DIAGNOSIS — Z7984 Long term (current) use of oral hypoglycemic drugs: Secondary | ICD-10-CM

## 2023-02-10 DIAGNOSIS — J9601 Acute respiratory failure with hypoxia: Secondary | ICD-10-CM | POA: Diagnosis present

## 2023-02-10 DIAGNOSIS — R0600 Dyspnea, unspecified: Secondary | ICD-10-CM

## 2023-02-10 DIAGNOSIS — Z803 Family history of malignant neoplasm of breast: Secondary | ICD-10-CM | POA: Diagnosis not present

## 2023-02-10 DIAGNOSIS — K219 Gastro-esophageal reflux disease without esophagitis: Secondary | ICD-10-CM | POA: Diagnosis present

## 2023-02-10 DIAGNOSIS — J4541 Moderate persistent asthma with (acute) exacerbation: Secondary | ICD-10-CM

## 2023-02-10 DIAGNOSIS — E876 Hypokalemia: Secondary | ICD-10-CM | POA: Diagnosis present

## 2023-02-10 DIAGNOSIS — L03115 Cellulitis of right lower limb: Secondary | ICD-10-CM | POA: Diagnosis present

## 2023-02-10 DIAGNOSIS — Z8249 Family history of ischemic heart disease and other diseases of the circulatory system: Secondary | ICD-10-CM | POA: Diagnosis not present

## 2023-02-10 DIAGNOSIS — Z91041 Radiographic dye allergy status: Secondary | ICD-10-CM

## 2023-02-10 DIAGNOSIS — J180 Bronchopneumonia, unspecified organism: Secondary | ICD-10-CM | POA: Diagnosis present

## 2023-02-10 DIAGNOSIS — E1165 Type 2 diabetes mellitus with hyperglycemia: Secondary | ICD-10-CM | POA: Diagnosis present

## 2023-02-10 DIAGNOSIS — G2581 Restless legs syndrome: Secondary | ICD-10-CM | POA: Diagnosis present

## 2023-02-10 DIAGNOSIS — I509 Heart failure, unspecified: Secondary | ICD-10-CM | POA: Diagnosis not present

## 2023-02-10 DIAGNOSIS — Z79899 Other long term (current) drug therapy: Secondary | ICD-10-CM

## 2023-02-10 DIAGNOSIS — Z8041 Family history of malignant neoplasm of ovary: Secondary | ICD-10-CM

## 2023-02-10 DIAGNOSIS — Z1152 Encounter for screening for COVID-19: Secondary | ICD-10-CM | POA: Diagnosis not present

## 2023-02-10 DIAGNOSIS — J40 Bronchitis, not specified as acute or chronic: Secondary | ICD-10-CM | POA: Diagnosis present

## 2023-02-10 DIAGNOSIS — Z90710 Acquired absence of both cervix and uterus: Secondary | ICD-10-CM | POA: Diagnosis not present

## 2023-02-10 DIAGNOSIS — T502X5A Adverse effect of carbonic-anhydrase inhibitors, benzothiadiazides and other diuretics, initial encounter: Secondary | ICD-10-CM | POA: Diagnosis present

## 2023-02-10 DIAGNOSIS — J209 Acute bronchitis, unspecified: Secondary | ICD-10-CM | POA: Diagnosis present

## 2023-02-10 DIAGNOSIS — Z833 Family history of diabetes mellitus: Secondary | ICD-10-CM | POA: Diagnosis not present

## 2023-02-10 DIAGNOSIS — J9691 Respiratory failure, unspecified with hypoxia: Secondary | ICD-10-CM | POA: Diagnosis present

## 2023-02-10 DIAGNOSIS — J45901 Unspecified asthma with (acute) exacerbation: Secondary | ICD-10-CM | POA: Diagnosis present

## 2023-02-10 DIAGNOSIS — L03116 Cellulitis of left lower limb: Secondary | ICD-10-CM | POA: Diagnosis present

## 2023-02-10 LAB — CBC
HCT: 41.5 % (ref 36.0–46.0)
HCT: 38.3 % (ref 36.0–46.0)
Hemoglobin: 13.1 g/dL (ref 12.0–15.0)
Hemoglobin: 12.1 g/dL (ref 12.0–15.0)
MCH: 27.9 pg (ref 26.0–34.0)
MCH: 28 pg (ref 26.0–34.0)
MCHC: 31.6 g/dL (ref 30.0–36.0)
MCHC: 31.6 g/dL (ref 30.0–36.0)
MCV: 88.5 fL (ref 80.0–100.0)
MCV: 88.7 fL (ref 80.0–100.0)
Platelets: 379 10*3/uL (ref 150–400)
Platelets: 355 10*3/uL (ref 150–400)
RBC: 4.69 MIL/uL (ref 3.87–5.11)
RBC: 4.32 MIL/uL (ref 3.87–5.11)
RDW: 14.7 % (ref 11.5–15.5)
RDW: 14.6 % (ref 11.5–15.5)
WBC: 7.2 10*3/uL (ref 4.0–10.5)
WBC: 8.3 10*3/uL (ref 4.0–10.5)
nRBC: 0 % (ref 0.0–0.2)
nRBC: 0 % (ref 0.0–0.2)

## 2023-02-10 LAB — CREATININE, SERUM
Creatinine, Ser: 0.87 mg/dL (ref 0.44–1.00)
GFR, Estimated: >60 mL/min (ref >60)

## 2023-02-10 LAB — RESPIRATORY PANEL BY PCR

## 2023-02-10 LAB — CBG MONITORING, ED: Glucose-Capillary: 153 mg/dL — ABNORMAL HIGH (ref 70–99)

## 2023-02-10 LAB — COMPREHENSIVE METABOLIC PANEL
ALT: 13 U/L (ref 0–44)
AST: 14 U/L — ABNORMAL LOW (ref 15–41)
Albumin: 4.3 g/dL (ref 3.5–5.0)
Alkaline Phosphatase: 74 U/L (ref 38–126)
Anion gap: 10 (ref 5–15)
BUN: 10 mg/dL (ref 8–23)
CO2: 26 mmol/L (ref 22–32)
Calcium: 8.8 mg/dL — ABNORMAL LOW (ref 8.9–10.3)
Chloride: 102 mmol/L (ref 98–111)
Creatinine, Ser: 0.89 mg/dL (ref 0.44–1.00)
GFR, Estimated: >60 mL/min (ref >60)
Glucose, Bld: 166 mg/dL — ABNORMAL HIGH (ref 70–99)
Potassium: 3.1 mmol/L — ABNORMAL LOW (ref 3.5–5.1)
Sodium: 138 mmol/L (ref 135–145)
Total Bilirubin: 0.7 mg/dL (ref 0.3–1.2)
Total Protein: 8.1 g/dL (ref 6.5–8.1)

## 2023-02-10 LAB — HIV ANTIBODY (ROUTINE TESTING W REFLEX): HIV Screen 4th Generation wRfx: NONREACTIVE

## 2023-02-10 LAB — D-DIMER, QUANTITATIVE: D-Dimer, Quant: 1.38 ug/mL-FEU — ABNORMAL HIGH (ref 0.00–0.50)

## 2023-02-10 LAB — PHOSPHORUS: Phosphorus: 3.7 mg/dL (ref 2.5–4.6)

## 2023-02-10 LAB — SARS CORONAVIRUS 2 BY RT PCR: SARS Coronavirus 2 by RT PCR: NEGATIVE

## 2023-02-10 LAB — BRAIN NATRIURETIC PEPTIDE: B Natriuretic Peptide: 13.6 pg/mL (ref 0.0–100.0)

## 2023-02-10 LAB — MAGNESIUM: Magnesium: 2 mg/dL (ref 1.7–2.4)

## 2023-02-10 LAB — TROPONIN I (HIGH SENSITIVITY): Troponin I (High Sensitivity): 10 ng/L (ref <18)

## 2023-02-10 MED ORDER — POTASSIUM CHLORIDE CRYS ER 20 MEQ PO TBCR
40.0000 meq | EXTENDED_RELEASE_TABLET | Freq: Once | ORAL | Status: AC
  Administered 2023-02-10: 40 meq via ORAL
  Filled 2023-02-10: qty 2

## 2023-02-10 MED ORDER — SODIUM CHLORIDE 0.9 % IV SOLN
1.0000 g | Freq: Once | INTRAVENOUS | Status: AC
  Administered 2023-02-10: 1 g via INTRAVENOUS
  Filled 2023-02-10: qty 10

## 2023-02-10 MED ORDER — HYDROCOD POLI-CHLORPHE POLI ER 10-8 MG/5ML PO SUER
5.0000 mL | Freq: Two times a day (BID) | ORAL | Status: DC | PRN
  Administered 2023-02-10 – 2023-02-11 (×2): 5 mL via ORAL
  Filled 2023-02-10: qty 5

## 2023-02-10 MED ORDER — SODIUM CHLORIDE 0.9% FLUSH: 3.0000 mL | INTRAVENOUS | Status: DC | PRN

## 2023-02-10 MED ORDER — IPRATROPIUM-ALBUTEROL 0.5-2.5 (3) MG/3ML IN SOLN
3.0000 mL | Freq: Once | RESPIRATORY_TRACT | Status: AC
  Administered 2023-02-10: 3 mL via RESPIRATORY_TRACT
  Filled 2023-02-10: qty 3

## 2023-02-10 MED ORDER — BISACODYL 5 MG PO TBEC: 10.0000 mg | DELAYED_RELEASE_TABLET | Freq: Every day | ORAL | Status: DC | PRN

## 2023-02-10 MED ORDER — ACETAMINOPHEN 325 MG PO TABS
650.0000 mg | ORAL_TABLET | Freq: Four times a day (QID) | ORAL | Status: DC | PRN
  Administered 2023-02-11 – 2023-02-12 (×4): 650 mg via ORAL
  Filled 2023-02-10 (×4): qty 2

## 2023-02-10 MED ORDER — IPRATROPIUM-ALBUTEROL 0.5-2.5 (3) MG/3ML IN SOLN
3.0000 mL | Freq: Four times a day (QID) | RESPIRATORY_TRACT | Status: DC
  Administered 2023-02-10 – 2023-02-11 (×4): 3 mL via RESPIRATORY_TRACT
  Filled 2023-02-10 (×4): qty 3

## 2023-02-10 MED ORDER — HYDRALAZINE HCL 20 MG/ML IJ SOLN: 10.0000 mg | Freq: Four times a day (QID) | INTRAMUSCULAR | Status: DC | PRN

## 2023-02-10 MED ORDER — SODIUM CHLORIDE 0.9 % IV SOLN
1.0000 g | INTRAVENOUS | Status: DC
  Administered 2023-02-11 – 2023-02-13 (×3): 1 g via INTRAVENOUS
  Filled 2023-02-10 (×4): qty 10

## 2023-02-10 MED ORDER — METHYLPREDNISOLONE SODIUM SUCC 125 MG IJ SOLR
125.0000 mg | Freq: Once | INTRAMUSCULAR | Status: AC
  Administered 2023-02-10: 125 mg via INTRAVENOUS
  Filled 2023-02-10: qty 2

## 2023-02-10 MED ORDER — PREDNISONE 20 MG PO TABS
40.0000 mg | ORAL_TABLET | Freq: Every day | ORAL | Status: DC
  Administered 2023-02-13: 40 mg via ORAL
  Filled 2023-02-10: qty 2

## 2023-02-10 MED ORDER — ACETAMINOPHEN 650 MG RE SUPP: 650.0000 mg | Freq: Four times a day (QID) | RECTAL | Status: DC | PRN

## 2023-02-10 MED ORDER — METHYLPREDNISOLONE SODIUM SUCC 40 MG IJ SOLR
40.0000 mg | Freq: Two times a day (BID) | INTRAMUSCULAR | Status: AC
  Administered 2023-02-11 – 2023-02-12 (×4): 40 mg via INTRAVENOUS
  Filled 2023-02-10 (×4): qty 1

## 2023-02-10 MED ORDER — ENOXAPARIN SODIUM 80 MG/0.8ML IJ SOSY
0.5000 mg/kg | PREFILLED_SYRINGE | INTRAMUSCULAR | Status: DC
  Administered 2023-02-11 – 2023-02-12 (×3): 70 mg via SUBCUTANEOUS
  Filled 2023-02-10 (×4): qty 0.7

## 2023-02-10 MED ORDER — INSULIN ASPART 100 UNIT/ML IJ SOLN
0.0000 [IU] | Freq: Three times a day (TID) | INTRAMUSCULAR | Status: DC
  Administered 2023-02-11: 15 [IU] via SUBCUTANEOUS
  Administered 2023-02-11: 7 [IU] via SUBCUTANEOUS
  Administered 2023-02-12 (×2): 11 [IU] via SUBCUTANEOUS
  Administered 2023-02-12: 7 [IU] via SUBCUTANEOUS
  Administered 2023-02-13: 11 [IU] via SUBCUTANEOUS
  Administered 2023-02-13: 4 [IU] via SUBCUTANEOUS
  Administered 2023-02-13: 15 [IU] via SUBCUTANEOUS
  Filled 2023-02-10 (×8): qty 1

## 2023-02-10 MED ORDER — ROPINIROLE HCL 1 MG PO TABS
2.0000 mg | ORAL_TABLET | Freq: Two times a day (BID) | ORAL | Status: DC
  Administered 2023-02-10 – 2023-02-13 (×6): 2 mg via ORAL
  Filled 2023-02-10 (×7): qty 2

## 2023-02-10 MED ORDER — PANTOPRAZOLE SODIUM 40 MG PO TBEC
40.0000 mg | DELAYED_RELEASE_TABLET | Freq: Every day | ORAL | Status: DC
  Administered 2023-02-10 – 2023-02-13 (×4): 40 mg via ORAL
  Filled 2023-02-10 (×4): qty 1

## 2023-02-10 MED ORDER — FLUTICASONE FUROATE-VILANTEROL 200-25 MCG/ACT IN AEPB
1.0000 | INHALATION_SPRAY | Freq: Every day | RESPIRATORY_TRACT | Status: DC
  Administered 2023-02-10 – 2023-02-13 (×4): 1 via RESPIRATORY_TRACT
  Filled 2023-02-10: qty 28

## 2023-02-10 MED ORDER — SODIUM CHLORIDE 0.9 % IV SOLN
500.0000 mg | Freq: Once | INTRAVENOUS | Status: AC
  Administered 2023-02-10: 500 mg via INTRAVENOUS
  Filled 2023-02-10: qty 5

## 2023-02-10 MED ORDER — AMLODIPINE BESYLATE 5 MG PO TABS
5.0000 mg | ORAL_TABLET | Freq: Every day | ORAL | Status: DC
  Administered 2023-02-12 – 2023-02-13 (×2): 5 mg via ORAL
  Filled 2023-02-10 (×3): qty 1

## 2023-02-10 MED ORDER — SODIUM CHLORIDE 0.9 % IV SOLN: 250.0000 mL | INTRAVENOUS | Status: DC | PRN

## 2023-02-10 MED ORDER — GUAIFENESIN ER 600 MG PO TB12
600.0000 mg | ORAL_TABLET | Freq: Two times a day (BID) | ORAL | Status: DC
  Administered 2023-02-10 – 2023-02-13 (×6): 600 mg via ORAL
  Filled 2023-02-10 (×6): qty 1

## 2023-02-10 MED ORDER — AZITHROMYCIN 500 MG PO TABS
500.0000 mg | ORAL_TABLET | Freq: Every day | ORAL | Status: DC
  Administered 2023-02-11 – 2023-02-12 (×2): 500 mg via ORAL
  Filled 2023-02-10 (×2): qty 1

## 2023-02-10 MED ORDER — ENOXAPARIN SODIUM 40 MG/0.4ML IJ SOSY: 40.0000 mg | PREFILLED_SYRINGE | INTRAMUSCULAR | Status: DC

## 2023-02-10 MED ORDER — ONDANSETRON HCL 4 MG PO TABS
4.0000 mg | ORAL_TABLET | Freq: Four times a day (QID) | ORAL | Status: DC | PRN
  Administered 2023-02-11: 4 mg via ORAL
  Filled 2023-02-10: qty 1

## 2023-02-10 MED ORDER — ONDANSETRON HCL 4 MG/2ML IJ SOLN
4.0000 mg | Freq: Four times a day (QID) | INTRAMUSCULAR | Status: DC | PRN
  Administered 2023-02-10: 4 mg via INTRAVENOUS
  Filled 2023-02-10: qty 2

## 2023-02-10 MED ORDER — AZITHROMYCIN 500 MG PO TABS: 500.0000 mg | ORAL_TABLET | Freq: Every day | ORAL | Status: DC

## 2023-02-10 MED ORDER — POTASSIUM CHLORIDE 10 MEQ/100ML IV SOLN
10.0000 meq | INTRAVENOUS | Status: AC
  Administered 2023-02-10 (×4): 10 meq via INTRAVENOUS
  Filled 2023-02-10 (×2): qty 100

## 2023-02-10 MED ORDER — HYDROCOD POLI-CHLORPHE POLI ER 10-8 MG/5ML PO SUER
5.0000 mL | Freq: Once | ORAL | Status: DC
  Filled 2023-02-10: qty 5

## 2023-02-10 MED ORDER — SODIUM CHLORIDE 0.9% FLUSH
3.0000 mL | Freq: Two times a day (BID) | INTRAVENOUS | Status: DC
  Administered 2023-02-11 – 2023-02-13 (×5): 3 mL via INTRAVENOUS

## 2023-02-10 NOTE — ED Notes (Signed)
Pt coughing and vomiting clear mucous.

## 2023-02-10 NOTE — ED Notes (Signed)
Pt back to room. Notified EDP Kinner pt struggling with SOB, nausea and cough. Pt requesting meds for nausea and cough. Pt tearful from cough.

## 2023-02-10 NOTE — ED Notes (Signed)
Pt leaving for imaging. Will collect blood when pt back to room.

## 2023-02-10 NOTE — ED Triage Notes (Signed)
Patient to ED via [facility] from home. Patient states she has been feeling bad the past few days but worse today with SOB, productive cough, sweating and vomiting. Placed on 4L [place] with EMS due to O2 being 90's. Does not normally wear O2. Bilateral lower leg swelling.

## 2023-02-10 NOTE — Progress Notes (Signed)
PHARMACIST - PHYSICIAN COMMUNICATION  CONCERNING:  Enoxaparin (Lovenox) for DVT Prophylaxis    RECOMMENDATION: Patient was prescribed enoxaprin 40mg  q24 hours for VTE prophylaxis.   Filed Weights   02/10/23 1926  Weight: (!) 138.6 kg (305 lb 9.6 oz)    Body mass index is 52.46 kg/m.  Estimated Creatinine Clearance: 91.1 mL/min (by C-G formula based on SCr of 0.87 mg/dL).   Based on [facility] policy patient is candidate for enoxaparin 0.5mg /kg TBW SQ every 24 hours based on BMI being >30.  DESCRIPTION: Pharmacy has adjusted enoxaparin dose per [facility] policy.  Patient is now receiving enoxaparin 0.5 mg/kg every 24 hours    Lowella Bandy, PharmD Clinical Pharmacist  02/10/2023 7:29 PM

## 2023-02-10 NOTE — ED Notes (Signed)
Turned oxygen to 2L.

## 2023-02-10 NOTE — ED Notes (Signed)
EKG from [place] to [employer] now in person.

## 2023-02-10 NOTE — ED Notes (Signed)
Pt SOB, 88% RA; placed back on 4L O2 via [place], mild nausea. Face oily but rest of skin dry. Productive cough; clear phlegm per pt. Pt sitting on stretcher with HOB at 90 degrees; RR inc to about 24 bmp; pt denies hx of COPD, asthma or CHF.

## 2023-02-10 NOTE — H&P (Signed)
[facility] History and Physical   Patient: Kristina Cruz LFY:[id number]   PCP: System, Provider Not In DOB: Jul 16, 1961   DOA: 02/10/2023   DOS: 02/10/2023   DOS: the patient was seen and examined on 02/10/2023  Patient coming from: The patient is coming from Home  Chief Complaint: Shortness of breath  HPI: Kristina Cruz is a 61 y.o. female with Past medical history of NIDDM T2, HTN, RLS, asthma, morbid obesity, lower extremity edema, as reviewed from EMR, presented at [facility] ED with complaining of shortness of breath.  As per patient she is having shortness of breath for the whole month which increased for the past few days.  Patient is having cough with phlegm production sometimes it is clear sometimes yellow and also fever and chills for the past 2 days.  Patient also complaining of lower extremity edema, mostly in the left lower extremity with some tenderness on the lateral side.  She recently has physical done with PCP, lower extremity edema is not improving.  Any chest pain or palpitations, no any other active issues.   ED Course: O2 sats 88%, hypoxic respiratory failure patient was placed on 4 L oxygen, wean down to 2 L. WBC count within normal range, hypokalemia potassium 3.1, mild hyperglycemia CXR consistent with peribronchial thickening, possible bronchitis.  No pneumonia Patient received Solu-Medrol 125 mg x 1 dose in the ED, DuoNeb 3 times and antibiotics azithromycin and ceftriaxone. TRH consulted for admission and further management.   Review of Systems: as mentioned in the history of present illness.  All other systems reviewed and are negative.  Past Medical History:  Diagnosis Date   Anemia    Asthma    Back pain    Cataract    Diabetes mellitus without complication (HCC)    GERD (gastroesophageal reflux disease)    Hypertension    Morbid obesity with BMI of 60.0-69.9, adult (HCC)    Restless leg syndrome    Past Surgical History:  Procedure  Laterality Date   ABDOMINAL HYSTERECTOMY     CATARACT EXTRACTION W/PHACO Left 04/13/2020   Procedure: CATARACT EXTRACTION PHACO AND INTRAOCULAR LENS PLACEMENT (IOC);  Surgeon: Elliot Cousin, MD;  Location: [facility] ORS;  Service: Ophthalmology;  Laterality: Left;  [place] 00:36.0 CDE 5.95 Fluid Pack lot # [id number]   HYSTEROSCOPY WITH D & C N/A 07/14/2017   Procedure: DILATATION AND CURETTAGE /HYSTEROSCOPY;  Surgeon: Nadara Mustard, MD;  Location: [facility] ORS;  Service: Gynecology;  Laterality: N/A;   POLYPECTOMY  2015   Social History:  reports that she has never smoked. She has never used smokeless tobacco. She reports that she does not drink alcohol and does not use drugs.  Allergies  Allergen Reactions   Contrast Media [Iodinated Contrast Media] Hives    Family history reviewed and not pertinent Family History  Problem Relation Age of Onset   Breast cancer Mother 69   Diabetes Mother    Hypertension Mother    Ovarian cancer Paternal Aunt        ?   Diabetes Father    Hypertension Father      Prior to Admission medications   Medication Sig Start Date End Date Taking? Authorizing Provider  acetaminophen (TYLENOL 8 HOUR ARTHRITIS PAIN) 650 MG CR tablet Take 1,950 mg by mouth every 8 (eight) hours as needed for pain.    [provider]  amLODipine (NORVASC) 5 MG tablet Take 5 mg by mouth daily.    [provider]  baclofen (LIORESAL) 10  MG tablet Take 10 mg by mouth 3 (three) times daily.    [provider]  diphenhydrAMINE (BENADRYL) 50 MG tablet Take 1 tablet (50 mg total) by mouth every 6 (six) hours as needed for itching (swelling). 09/19/20   Shaune Pollack, MD  EPINEPHrine 0.3 mg/0.3 mL IJ SOAJ injection Inject 0.3 mg into the muscle as needed for anaphylaxis. 09/19/20   Shaune Pollack, MD  famotidine (PEPCID) 20 MG tablet Take 1 tablet (20 mg total) by mouth 2 (two) times daily for 3 days. 09/19/20 09/22/20  Shaune Pollack, MD  furosemide (LASIX) 20 MG tablet  Take 1 tablet (20 mg total) by mouth daily. 01/15/17 04/13/20  Irean Hong, MD  furosemide (LASIX) 40 MG tablet Take 40 mg by mouth daily. 03/23/20   [provider]  metFORMIN (GLUCOPHAGE) 500 MG tablet Take 500 mg by mouth 2 (two) times daily.  07/01/17   [provider]  ondansetron (ZOFRAN-ODT) 4 MG disintegrating tablet Take 1 tablet (4 mg total) by mouth every 8 (eight) hours as needed for nausea or vomiting. 10/17/22   Minna Antis, MD  ranitidine (ZANTAC) 150 MG tablet Take 1 tablet (150 mg total) by mouth 2 (two) times daily. Patient not taking: Reported on 04/09/2020 09/04/18 09/04/19  Arnaldo Natal, MD  rOPINIRole (REQUIP) 2 MG tablet Take 2 mg by mouth in the morning and at bedtime.     [provider]  traMADol (ULTRAM) 50 MG tablet Take 1 tablet (50 mg total) by mouth every 6 (six) hours as needed. Patient not taking: Reported on 04/09/2020 09/04/19   Minna Antis, MD  Mon[place] Fantasia INHUB 100-50 MCG/DOSE AEPB Inhale 1 puff into the lungs 2 (two) times daily as needed for shortness of breath. 03/20/20   [provider]    Physical Exam: Vitals:   02/10/23 1323 02/10/23 1409 02/10/23 1417 02/10/23 1559  BP: (!) 157/71  114/62 111/81  Pulse: 88 87 (!) 101 92  Resp: 18 (!) 25 18 (!) 24  Temp: 98.6 F (37 C)     TempSrc: Oral     SpO2: 98% 95% 98% 100%    General: alert and oriented to time, place, and person. Appear in mild distress, affect appropriate Eyes: PERRLA, Conjunctiva normal ENT: Oral Mucosa Clear, moist  Neck: no JVD, no Abnormal Mass Or lumps Cardiovascular: S1 and S2 Present, no Murmur, peripheral pulses symmetrical Respiratory: increased respiratory effort, Bilateral Air entry equal and Decreased, no signs of accessory muscle use, b/l Crackles, and  wheezes Abdomen: Bowel Sound present, Soft and no tenderness, no hernia, obese Skin: no rashes  Extremities: 2-3+ Pedal edema, no calf tenderness, Mild erythema bilaterally and left  lateral extremity tenderness possible cellulitis Neurologic: without any new focal findings Gait not checked due to patient safety concerns  Data Reviewed: I have personally reviewed and interpreted labs, imaging as discussed below.  CBC: Recent Labs  Lab 02/10/23 1407  WBC 7.2  HGB 13.1  HCT 41.5  MCV 88.5  PLT 379   Basic Metabolic Panel: Recent Labs  Lab 02/10/23 1407  NA 138  K 3.1*  CL 102  CO2 26  GLUCOSE 166*  BUN 10  CREATININE 0.89  CALCIUM 8.8*  MG 2.0  PHOS 3.7   GFR: CrCl cannot be calculated (Unknown ideal weight.). Liver Function Tests: Recent Labs  Lab 02/10/23 1407  AST 14*  ALT 13  ALKPHOS 74  BILITOT 0.7  PROT 8.1  ALBUMIN 4.3   No results for input(s): "  LIPASE", "AMYLASE" in the last 168 hours. No results for input(s): "AMMONIA" in the last 168 hours. Coagulation Profile: No results for input(s): "INR", "PROTIME" in the last 168 hours. Cardiac Enzymes: No results for input(s): "CKTOTAL", "CKMB", "CKMBINDEX", "TROPONINI" in the last 168 hours. BNP (last 3 results) No results for input(s): "PROBNP" in the last 8760 hours. HbA1C: No results for input(s): "HGBA1C" in the last 72 hours. CBG: Recent Labs  Lab 02/10/23 1711  GLUCAP 153*   Lipid Profile: No results for input(s): "CHOL", "HDL", "LDLCALC", "TRIG", "CHOLHDL", "LDLDIRECT" in the last 72 hours. Thyroid Function Tests: No results for input(s): "TSH", "T4TOTAL", "FREET4", "T3FREE", "THYROIDAB" in the last 72 hours. Anemia Panel: No results for input(s): "VITAMINB12", "FOLATE", "FERRITIN", "TIBC", "IRON", "RETICCTPCT" in the last 72 hours. Urine analysis:    Component Value Date/Time   COLORURINE YELLOW (A) 10/16/2022 2321   APPEARANCEUR CLEAR (A) 10/16/2022 2321   APPEARANCEUR Cloudy 08/11/2013 0124   LABSPEC 1.018 10/16/2022 2321   LABSPEC 1.021 08/11/2013 0124   PHURINE 5.0 10/16/2022 2321   GLUCOSEU 50 (A) 10/16/2022 2321   GLUCOSEU Negative 08/11/2013 0124   HGBUR  NEGATIVE 10/16/2022 2321   BILIRUBINUR NEGATIVE 10/16/2022 2321   BILIRUBINUR Negative 08/11/2013 0124   KETONESUR 20 (A) 10/16/2022 2321   PROTEINUR 30 (A) 10/16/2022 2321   NITRITE NEGATIVE 10/16/2022 2321   LEUKOCYTESUR NEGATIVE 10/16/2022 2321   LEUKOCYTESUR Negative 08/11/2013 0124    Radiological Exams on Admission: DG Chest 2 View  Result Date: 02/10/2023 CLINICAL DATA:  COPD EXAM: CHEST - 2 VIEW COMPARISON:  X-ray 02/16/2015 and CT angiogram. Older x-rays as well FINDINGS: Hyperinflation. Under penetrated radiograph with enlarged cardiopericardial silhouette. Peribronchial thickening. No consolidation, pneumothorax or effusion. No edema. Degenerative changes seen of the spine. Motion on lateral view. IMPRESSION: Enlarged heart with peribronchial thickening. Electronically Signed   By: Karen Kays M.D.   On: 02/10/2023 14:06    I reviewed all nursing notes, pharmacy notes, vitals, pertinent old records.  Assessment/Plan Principal Problem:   Respiratory failure with hypoxia (HCC)   Respiratory failure with hypoxia due to acute bronchitis, possible bronchopneumonia Continue supplemental O2 inhalation and gradually wean off Continue ceftriaxone and azithromycin Started Breo Ellipta inhaler, DuoNeb every 6 hourly scheduled, transition to as needed after improvement Mucinex 600 mg p.o. twice daily, Tussionex twice daily as needed Follow COVID and RVP swab Keep on isolation for now  Hypokalemia, most likely due to diuretics potassium repleted Monitor electrolytes daily   Bilateral lower extremity edema could be due to cellulitis BNP within normal range Continue above antibiotics Check D-dimer and venous duplex to rule out DVT Hold diuretics for now  NIDDM T2, no A1c Patient remains at high risk for hypoglycemia due to steroids Held metformin for now [employer] sliding scale, monitor CBG Continue diabetic diet   Hypertension, blood pressure soft, continue to  monitor Started amlodipine 5 mg p.o. daily home dose with holding parameters   GERD, continue PPI RLS, continue Requip 2 mg p.o. twice daily home dose Morbid obesity, calorie restricted diet and daily exercise advised to lose body weight.  Lifestyle modification discussed.   Nutrition: Carb modified diet DVT Prophylaxis: Subcutaneous Lovenox  Advance goals of care discussion: Full code   Consults: none  Family Communication: family was not present at bedside, at the time of interview.  Opportunity was given to ask question and all questions were answered satisfactorily.  Disposition: Admitted as inpatient, telemetry unit. Likely to be discharged home, in 2-3 days.  I have discussed plan of care as described above with RN and patient/family.  Severity of Illness: The appropriate patient status for this patient is INPATIENT. Inpatient status is judged to be reasonable and necessary in order to provide the required intensity of service to ensure the patient's safety. The patient's presenting symptoms, physical exam findings, and initial radiographic and laboratory data in the context of their chronic comorbidities is felt to place them at high risk for further clinical deterioration. Furthermore, it is not anticipated that the patient will be medically stable for discharge from the hospital within 2 midnights of admission.   * I certify that at the point of admission it is my clinical judgment that the patient will require inpatient hospital care spanning beyond 2 midnights from the point of admission due to high intensity of service, high risk for further deterioration and high frequency of surveillance required.*   Author: Gillis Santa, MD [facility] 02/10/2023 5:21 PM   To reach On-call, see care teams to locate the attending and reach out to them via [email or web address]. If 7PM-7AM, please contact night-coverage If you still have difficulty reaching the attending provider, please  page the [facility] (Director on Call) for [facility] on amion for assistance.

## 2023-02-10 NOTE — ED Provider Notes (Signed)
[facility] Provider Note    Event Date/Time   First MD Initiated Contact with Patient 02/10/23 1500     (approximate)  History   Chief Complaint: Shortness of Breath  HPI  Kristina Cruz is a 62 y.o. female with a past medical history of anemia, asthma, diabetes, gastric reflux, hypertension, morbid obesity, presents to the emergency department for worsening shortness of breath cough congestion.  Patient found to be satting in the upper 80s on room air with no baseline O2 requirement currently on 4 L nasal cannula satting 95%.  Patient states that history of asthma does have audible wheeze also states increased lower extremity edema recently.  Physical Exam   Triage Vital Signs: ED Triage Vitals [02/10/23 1323]  Enc Vitals Group     BP (!) 157/71     Pulse Rate 88     Resp 18     Temp 98.6 F (37 C)     Temp Source Oral     SpO2 98 %     Weight      Height      Head Circumference      Peak Flow      Pain Score 0     Pain Loc      Pain Edu?      Excl. in GC?     Most recent vital signs: Vitals:   02/10/23 1409 02/10/23 1417  BP:  114/62  Pulse: 87 (!) 101  Resp: (!) 25 18  Temp:    SpO2: 95% 98%    General: Awake, mild respiratory distress speaking 1-2 word sentences sitting upright in bed. CV:  Good peripheral perfusion.  Regular rate and rhythm  Resp:  Normal effort.  Equal breath sounds bilaterally.  Mild expiratory wheeze bilaterally.  No rales or rhonchi. Abd:  No distention.  Soft, nontender.  No rebound or guarding.  Obese Other:  Mild lower extremity edema equal bilaterally   ED Results / Procedures / Treatments   RADIOLOGY  I have reviewed and interpreted chest x-ray images.  Patient appears to have haziness throughout possibly interstitial edema. Radiology has read the x-ray as cardiomegaly with peribronchial thickening   MEDICATIONS ORDERED IN ED: Medications  methylPREDNISolone sodium succinate (SOLU-MEDROL) 125  mg/2 mL injection 125 mg (has no administration in time range)  ipratropium-albuterol (DUONEB) 0.5-2.5 (3) MG/3ML nebulizer solution 3 mL (has no administration in time range)  ipratropium-albuterol (DUONEB) 0.5-2.5 (3) MG/3ML nebulizer solution 3 mL (has no administration in time range)  ipratropium-albuterol (DUONEB) 0.5-2.5 (3) MG/3ML nebulizer solution 3 mL (has no administration in time range)  cefTRIAXone (ROCEPHIN) 1 g in sodium chloride 0.9 % 100 mL IVPB (has no administration in time range)  azithromycin (ZITHROMAX) 500 mg in sodium chloride 0.9 % 250 mL IVPB (has no administration in time range)     IMPRESSION / MDM / ASSESSMENT AND PLAN / ED COURSE  I reviewed the triage vital signs and the nursing notes.  Patient's presentation is most consistent with acute presentation with potential threat to life or bodily function.  Patient presents emergency department for shortness of breath and hypoxia.  Patient states worsening shortness of breath over the last 3 weeks or so but acutely worse over the past several days.  Patient has been coughing for the past several weeks as well and has noticed increased lower extremity edema.  Patient's chest x-ray most consistent with peribronchial thickening.  Patient requiring 4 L nasal cannula oxygen to maintain sats  in the 90s.  Does have expiratory wheeze on exam given the peribronchial thickening cough and reported subjective fever at home we will cover with IV Rocephin and Zithromax.  We will send blood cultures.  Patient's labs have resulted showing a normal CBC with a normal white blood cell count, reassuring chemistry and reassuringly negative troponin and normal BNP.  Highly suspect more of a asthma exacerbation due to respiratory infection.  Patient has received Solu-Medrol, DuoNebs and antibiotics.  Will admit to the hospital service for further workup and treatment.  Patient agreeable to plan of care.  CRITICAL CARE Performed by: Minna Antis   Total critical care time: 30 minutes  Critical care time was exclusive of separately billable procedures and treating other patients.  Critical care was necessary to treat or prevent imminent or life-threatening deterioration.  Critical care was time spent personally by me on the following activities: development of treatment plan with patient and/or surrogate as well as nursing, discussions with consultants, evaluation of patient's response to treatment, examination of patient, obtaining history from patient or surrogate, ordering and performing treatments and interventions, ordering and review of laboratory studies, ordering and review of radiographic studies, pulse oximetry and re-evaluation of patient's condition.   FINAL CLINICAL IMPRESSION(S) / ED DIAGNOSES   Asthma exacerbation Bronchitis URI Hypoxia   Note:  This document was prepared using Dragon voice recognition software and may include unintentional dictation errors.   Minna Antis, MD 02/10/23 [phone]

## 2023-02-10 NOTE — ED Notes (Signed)
Lt grn, blue, lav and red tubes sent to lab.

## 2023-02-10 NOTE — ED Notes (Signed)
Pt is in triage and is to be brought over soon by triage staff per other RN.

## 2023-02-10 NOTE — ED Notes (Signed)
Pt to ED via [facility] from home for difficulty breathing. Pt family reports pt has been feeling sick for a while but would not go to the doctor. Pt is coughing up phlegm. Pt denies chest pain or cardiac history but reports questionable changes on pts EKG. Pt also has significant lower extremity edema. Pts room air sats were 90% on room air and 95% on 4 liters. Pt was given a breathing treatment with ems.

## 2023-02-11 ENCOUNTER — Encounter: Payer: Self-pay | Admitting: Student

## 2023-02-11 ENCOUNTER — Inpatient Hospital Stay: Payer: 59

## 2023-02-11 DIAGNOSIS — J9601 Acute respiratory failure with hypoxia: Secondary | ICD-10-CM | POA: Diagnosis not present

## 2023-02-11 LAB — CBC
HCT: 40.9 % (ref 36.0–46.0)
Hemoglobin: 13 g/dL (ref 12.0–15.0)
MCH: 28.3 pg (ref 26.0–34.0)
MCHC: 31.8 g/dL (ref 30.0–36.0)
MCV: 88.9 fL (ref 80.0–100.0)
Platelets: 284 10*3/uL (ref 150–400)
RBC: 4.6 MIL/uL (ref 3.87–5.11)
RDW: 14.6 % (ref 11.5–15.5)
WBC: 7.3 10*3/uL (ref 4.0–10.5)
nRBC: 0 % (ref 0.0–0.2)

## 2023-02-11 LAB — BASIC METABOLIC PANEL
Anion gap: 9 (ref 5–15)
BUN: 8 mg/dL (ref 8–23)
CO2: 22 mmol/L (ref 22–32)
Calcium: 7.8 mg/dL — ABNORMAL LOW (ref 8.9–10.3)
Chloride: 107 mmol/L (ref 98–111)
Creatinine, Ser: 0.66 mg/dL (ref 0.44–1.00)
GFR, Estimated: >60 mL/min (ref >60)
Glucose, Bld: 271 mg/dL — ABNORMAL HIGH (ref 70–99)
Potassium: 4.2 mmol/L (ref 3.5–5.1)
Sodium: 138 mmol/L (ref 135–145)

## 2023-02-11 LAB — CBG MONITORING, ED
Glucose-Capillary: 255 mg/dL — ABNORMAL HIGH (ref 70–99)
Glucose-Capillary: 210 mg/dL — ABNORMAL HIGH (ref 70–99)

## 2023-02-11 LAB — MAGNESIUM: Magnesium: 1.9 mg/dL (ref 1.7–2.4)

## 2023-02-11 LAB — GLUCOSE, CAPILLARY
Glucose-Capillary: 330 mg/dL — ABNORMAL HIGH (ref 70–99)
Glucose-Capillary: 206 mg/dL — ABNORMAL HIGH (ref 70–99)

## 2023-02-11 LAB — PHOSPHORUS: Phosphorus: 2.2 mg/dL — ABNORMAL LOW (ref 2.5–4.6)

## 2023-02-11 MED ORDER — TECHNETIUM TO 99M ALBUMIN AGGREGATED
4.6400 | Freq: Once | INTRAVENOUS | Status: AC | PRN
  Administered 2023-02-11: 4.64 via INTRAVENOUS

## 2023-02-11 MED ORDER — K PHOS MONO-SOD PHOS DI & MONO 155-852-130 MG PO TABS
500.0000 mg | ORAL_TABLET | Freq: Four times a day (QID) | ORAL | Status: AC
  Administered 2023-02-11 (×2): 500 mg via ORAL
  Filled 2023-02-11 (×3): qty 2

## 2023-02-11 MED ORDER — IPRATROPIUM-ALBUTEROL 0.5-2.5 (3) MG/3ML IN SOLN: 3.0000 mL | Freq: Three times a day (TID) | RESPIRATORY_TRACT | Status: DC

## 2023-02-11 MED ORDER — IBUPROFEN 400 MG PO TABS
400.0000 mg | ORAL_TABLET | Freq: Once | ORAL | Status: AC
  Administered 2023-02-11: 400 mg via ORAL
  Filled 2023-02-11: qty 1

## 2023-02-11 NOTE — ED Notes (Signed)
Pt stating she does not want to eat breakfast, would eat saltine crackers. Pt states she doesn't eat bacon/sausage or bread. Pt denies wanting this RN to call cafeteria for a new order.

## 2023-02-11 NOTE — ED Notes (Signed)
EKG completed 02/10/23 at 13:32, signed by Dr. Cyril Loosen - hard copy to medical records

## 2023-02-11 NOTE — Inpatient Diabetes Management (Signed)
Inpatient Diabetes Program Recommendations  AACE/ADA: New Consensus Statement on Inpatient Glycemic Control (2015)  Target Ranges:  Prepandial:   less than 140 mg/dL      Peak postprandial:   less than 180 mg/dL (1-2 hours)      Critically ill patients:  140 - 180 mg/dL   Lab Results  Component Value Date   GLUCAP 210 (H) 02/11/2023   HGBA1C 6.0 (H) 12/29/2017    Review of Glycemic Control  Latest Reference Range & Units 02/10/23 17:11 02/11/23 08:13 02/11/23 11:38  Glucose-Capillary 70 - 99 mg/dL 578 (H) 469 (H) 629 (H)   Diabetes history: DM 2 Outpatient Diabetes medications:  Metformin 1000 mg bid Ozempic 0.5 mg weekly Current orders for Inpatient glycemic control:  Novolog 0-20 units tid with meals Solumedrol 40 mg IV q 12 hours for 2 days and then Prednisone 40 mg daily with breakfast Inpatient Diabetes Program Recommendations:    May consider adding Semglee 15 units daily while on steroids/in the hospital.   Thanks  Beryl Meager, RN, BC-ADM Inpatient Diabetes Coordinator Pager [phone]  (8a-5p)

## 2023-02-11 NOTE — ED Notes (Signed)
Pt taken for Nuc Med test

## 2023-02-11 NOTE — Progress Notes (Signed)
[facility] Progress Note  Patient: Kristina Cruz    ZOX:[id number]  DOA: 02/10/2023     Date of Service: the patient was seen and examined on 02/11/2023  Chief Complaint  Patient presents with   Shortness of Breath   Brief hospital course: Kristina Cruz is a 62 y.o. female with Past medical history of NIDDM T2, HTN, RLS, asthma, morbid obesity, lower extremity edema, as reviewed from EMR, presented at [facility] with complaining of shortness of breath.  As per patient she is having shortness of breath for the whole month which increased for the past few days.  Patient is having cough with phlegm production sometimes it is clear sometimes yellow and also fever and chills for the past 2 days.  Patient also complaining of lower extremity edema, mostly in the left lower extremity with some tenderness on the lateral side.  She recently has physical done with PCP, lower extremity edema is not improving.  Any chest pain or palpitations, no any other active issues.     ED Course: O2 sats 88%, hypoxic respiratory failure patient was placed on 4 L oxygen, wean down to 2 L. WBC count within normal range, hypokalemia potassium 3.1, mild hyperglycemia CXR consistent with peribronchial thickening, possible bronchitis.  No pneumonia Patient received Solu-Medrol 125 mg x 1 dose in the ED, DuoNeb 3 times and antibiotics azithromycin and ceftriaxone. TRH consulted for admission and further management.    Assessment and Plan: Principal Problem:   Respiratory failure with hypoxia (HCC)     Respiratory failure with hypoxia due to acute bronchitis, possible bronchopneumonia S/p supplemental O2 inhalation, gradually wean off.  Currently saturating well on room air Continue ceftriaxone and azithromycin Started Breo Ellipta inhaler, DuoNeb every 6 hourly scheduled, transition to as needed after improvement Mucinex 600 mg p.o. twice daily, Tussionex twice daily as needed COVID and RVP negative     Hypokalemia, most likely due to diuretics potassium repleted.  Resolved Hypophosphatemia due to nutritional deficiency.  Phos repleted. Monitor electrolytes daily     Bilateral lower extremity edema could be due to cellulitis BNP within normal range Continue above antibiotics D-dimer 1.38 elevated, venous duplex for DVT  NM perfusion scan negative for pulmonary embolism Hold diuretics for now With above antibiotics tenderness improved, patient will benefit from oral antibiotics on discharge for 5 to 7 days   NIDDM T2, no A1c Patient remains at high risk for hypoglycemia due to steroids Held metformin for now Started NovoLog sliding scale, monitor CBG Continue diabetic diet     Hypertension, blood pressure soft, continue to monitor Started amlodipine 5 mg p.o. daily home dose with holding parameters     GERD, continue PPI RLS, continue Requip 2 mg p.o. twice daily home dose Morbid obesity, calorie restricted diet and daily exercise advised to lose body weight.  Lifestyle modification discussed.    Body mass index is 52.46 kg/m.  Interventions:  Diet: Diabetic diet DVT Prophylaxis: Subcutaneous Lovenox   Advance goals of care discussion: Full code  Family Communication: family was present at bedside, at the time of interview.  The pt provided permission to discuss medical plan with the family. Opportunity was given to ask question and all questions were answered satisfactorily.   Disposition:  Pt is from Home, admitted with Resp Failure, and LE cellulitis, still has wheezing, which precludes a safe discharge. Discharge to Home, when stable.  Most likely tomorrow a.m.  Subjective: No significant events overnight, patient feels a lot better, still has mild  wheezing and shortness of breath, lower extremity tenderness is improving.  Denies any active issues.  Physical Exam: General: NAD, lying comfortably Appear in no distress, affect appropriate Eyes: PERRLA ENT: Oral  Mucosa Clear, moist  Neck: no JVD,  Cardiovascular: S1 and S2 Present, no Murmur,  Respiratory: Equal air entry bilaterally, mild wheezing bilaterally, no significant crackles. Abdomen: Bowel Sound present, Soft and no tenderness,  Skin: no rashes Extremities: Mild pedal edema, no calf tenderness.  Left lower extremity tenderness improved Neurologic: without any new focal findings Gait not checked due to patient safety concerns  Vitals:   02/11/23 1008 02/11/23 1021 02/11/23 1212 02/11/23 1534  BP: 139/81  (!) 145/77 138/74  Pulse: 75 75 71 77  Resp: 18 16 17 18   Temp:  98.2 F (36.8 C) 97.9 F (36.6 C) 97.9 F (36.6 C)  TempSrc:  Oral    SpO2: 97% 98% 96% 97%  Weight:      Height:        Intake/Output Summary (Last 24 hours) at 02/11/2023 1548 Last data filed at 02/11/2023 1016 Gross per 24 hour  Intake 303 ml  Output --  Net 303 ml   Filed Weights   02/10/23 1926  Weight: (!) 138.6 kg    Data Reviewed: I have personally reviewed and interpreted daily labs, tele strips, imagings as discussed above. I reviewed all nursing notes, pharmacy notes, vitals, pertinent old records I have discussed plan of care as described above with RN and patient/family.  CBC: Recent Labs  Lab 02/10/23 1407 02/10/23 1727 02/11/23 0425  WBC 7.2 8.3 7.3  HGB 13.1 12.1 13.0  HCT 41.5 38.3 40.9  MCV 88.5 88.7 88.9  PLT 379 355 284   Basic Metabolic Panel: Recent Labs  Lab 02/10/23 1407 02/10/23 1727 02/11/23 0425  NA 138  --  138  K 3.1*  --  4.2  CL 102  --  107  CO2 26  --  22  GLUCOSE 166*  --  271*  BUN 10  --  8  CREATININE 0.89 0.87 0.66  CALCIUM 8.8*  --  7.8*  MG 2.0  --  1.9  PHOS 3.7  --  2.2*    Studies: NM Pulmonary Perfusion  Result Date: 02/11/2023 CLINICAL DATA:  Pulmonary embolism suspected, high probability. Shortness of breath. History of asthma. EXAM: NUCLEAR MEDICINE PERFUSION LUNG SCAN TECHNIQUE: Perfusion images were obtained in multiple  projections after intravenous injection of radiopharmaceutical. Ventilation scans intentionally deferred if perfusion scan and chest x-ray adequate for interpretation since COVID 19 epidemic. RADIOPHARMACEUTICALS:  4.64 mCi Tc-40m MAA IV COMPARISON:  Chest radiograph 02/10/2023. Lower extremity venous Doppler ultrasound 02/10/2023. FINDINGS: There are no wedge-shaped perfusion defects to suggest pulmonary embolism. The pulmonary ventilation is within normal limits. IMPRESSION: No evidence of acute pulmonary embolism on perfusion scintigraphy by PISAPED criteria. Electronically Signed   By: Carey Bullocks M.D.   On: 02/11/2023 10:37   US Venous Img Lower Bilateral (DVT)  Result Date: 02/10/2023 CLINICAL DATA:  Bilateral lower extremity edema. EXAM: BILATERAL LOWER EXTREMITY VENOUS DOPPLER ULTRASOUND TECHNIQUE: Gray-scale sonography with compression, as well as color and duplex ultrasound, were performed to evaluate the deep venous system(s) from the level of the common femoral vein through the popliteal and proximal calf veins. COMPARISON:  Left lower extremity venous Doppler ultrasound 06/30/2016 FINDINGS: VENOUS Normal compressibility of the common femoral, superficial femoral, and popliteal veins, as well as the visualized calf veins (with the calf veins being poorly visualized bilaterally  due to body habitus). Visualized portions of profunda femoral vein and great saphenous vein unremarkable. No filling defects to suggest DVT on grayscale or color Doppler imaging. Doppler waveforms show normal direction of venous flow, normal respiratory plasticity and response to augmentation. OTHER None. Limitations: Body habitus IMPRESSION: No evidence of DVT in either lower extremity. Electronically Signed   By: Sebastian Ache M.D.   On: 02/10/2023 18:29    Scheduled Meds:  amLODipine  5 mg Oral Daily   azithromycin  500 mg Oral Daily   chlorpheniramine-HYDROcodone  5 mL Oral Once   enoxaparin (LOVENOX) injection  0.5  mg/kg Subcutaneous Q24H   fluticasone furoate-vilanterol  1 puff Inhalation Daily   guaiFENesin  600 mg Oral BID   insulin aspart  0-20 Units Subcutaneous TID WC   ipratropium-albuterol  3 mL Nebulization Q6H   methylPREDNISolone (SOLU-MEDROL) injection  40 mg Intravenous Q12H   Followed by   Melene Muller ON 02/13/2023] predniSONE  40 mg Oral Q breakfast   pantoprazole  40 mg Oral Daily   rOPINIRole  2 mg Oral BID PC   sodium chloride flush  3 mL Intravenous Q12H   Continuous Infusions:  sodium chloride     cefTRIAXone (ROCEPHIN)  IV 1 g (02/11/23 1523)   PRN Meds: sodium chloride, acetaminophen **OR** acetaminophen, bisacodyl, chlorpheniramine-HYDROcodone, hydrALAZINE, ondansetron **OR** ondansetron (ZOFRAN) IV, sodium chloride flush  Time spent: 35 minutes  Author: Gillis Santa. MD [facility] 02/11/2023 3:48 PM  To reach On-call, see care teams to locate the attending and reach out to them via [email or web address]. If 7PM-7AM, please contact night-coverage If you still have difficulty reaching the attending provider, please page the [facility] (Director on Call) for [facility] on amion for assistance.

## 2023-02-12 ENCOUNTER — Inpatient Hospital Stay (HOSPITAL_COMMUNITY)
Admit: 2023-02-12 | Discharge: 2023-02-12 | Disposition: A | Discharge status: Home / Self Care | Payer: 59 | Attending: Student | Admitting: Student

## 2023-02-12 DIAGNOSIS — I509 Heart failure, unspecified: Secondary | ICD-10-CM

## 2023-02-12 DIAGNOSIS — J9601 Acute respiratory failure with hypoxia: Secondary | ICD-10-CM | POA: Diagnosis not present

## 2023-02-12 LAB — BASIC METABOLIC PANEL
Anion gap: 10 (ref 5–15)
BUN: 14 mg/dL (ref 8–23)
CO2: 25 mmol/L (ref 22–32)
Calcium: 8.2 mg/dL — ABNORMAL LOW (ref 8.9–10.3)
Chloride: 102 mmol/L (ref 98–111)
Creatinine, Ser: 0.96 mg/dL (ref 0.44–1.00)
GFR, Estimated: >60 mL/min (ref >60)
Glucose, Bld: 268 mg/dL — ABNORMAL HIGH (ref 70–99)
Potassium: 3.9 mmol/L (ref 3.5–5.1)
Sodium: 137 mmol/L (ref 135–145)

## 2023-02-12 LAB — ECHOCARDIOGRAM COMPLETE
AR max vel: 2.88 cm2
AV Area VTI: 2.81 cm2
AV Area mean vel: 2.63 cm2
AV Mean grad: 5 mmHg
AV Peak grad: 11.2 mmHg
Ao pk vel: 1.67 m/s
Area-P 1/2: 5.06 cm2
Height: 64 in
MV VTI: 3.02 cm2
S' Lateral: 2.9 cm
Weight: 4889.6 oz

## 2023-02-12 LAB — CBC
HCT: 36.9 % (ref 36.0–46.0)
Hemoglobin: 12 g/dL (ref 12.0–15.0)
MCH: 28.5 pg (ref 26.0–34.0)
MCHC: 32.5 g/dL (ref 30.0–36.0)
MCV: 87.6 fL (ref 80.0–100.0)
Platelets: 400 10*3/uL (ref 150–400)
RBC: 4.21 MIL/uL (ref 3.87–5.11)
RDW: 14.6 % (ref 11.5–15.5)
WBC: 9.6 10*3/uL (ref 4.0–10.5)
nRBC: 0 % (ref 0.0–0.2)

## 2023-02-12 LAB — HEMOGLOBIN A1C
Hgb A1c MFr Bld: 8 % — ABNORMAL HIGH (ref 4.8–5.6)
Mean Plasma Glucose: 183 mg/dL

## 2023-02-12 LAB — GLUCOSE, CAPILLARY
Glucose-Capillary: 275 mg/dL — ABNORMAL HIGH (ref 70–99)
Glucose-Capillary: 211 mg/dL — ABNORMAL HIGH (ref 70–99)
Glucose-Capillary: 269 mg/dL — ABNORMAL HIGH (ref 70–99)
Glucose-Capillary: 251 mg/dL — ABNORMAL HIGH (ref 70–99)

## 2023-02-12 LAB — PHOSPHORUS: Phosphorus: 3.3 mg/dL (ref 2.5–4.6)

## 2023-02-12 LAB — MAGNESIUM: Magnesium: 2.3 mg/dL (ref 1.7–2.4)

## 2023-02-12 MED ORDER — HYDROCOD POLI-CHLORPHE POLI ER 10-8 MG/5ML PO SUER
5.0000 mL | Freq: Every day | ORAL | Status: DC
  Administered 2023-02-12: 5 mL via ORAL
  Filled 2023-02-12: qty 5

## 2023-02-12 MED ORDER — PERFLUTREN LIPID MICROSPHERE
1.0000 mL | INTRAVENOUS | Status: AC | PRN
  Administered 2023-02-12: 3 mL via INTRAVENOUS

## 2023-02-12 MED ORDER — IPRATROPIUM-ALBUTEROL 0.5-2.5 (3) MG/3ML IN SOLN
3.0000 mL | Freq: Four times a day (QID) | RESPIRATORY_TRACT | Status: DC
  Administered 2023-02-12 – 2023-02-13 (×6): 3 mL via RESPIRATORY_TRACT
  Filled 2023-02-12 (×6): qty 3

## 2023-02-12 MED ORDER — IPRATROPIUM-ALBUTEROL 0.5-2.5 (3) MG/3ML IN SOLN: 3.0000 mL | Freq: Four times a day (QID) | RESPIRATORY_TRACT | Status: DC

## 2023-02-12 NOTE — Care Management Important Message (Signed)
Important Message  Patient Details  Name: Kristina Cruz MRN: [id number] Date of Birth: [birth year]   Medicare Important Message Given:  N/A - LOS <3 / Initial given by admissions     Johnell Comings 02/12/2023, 7:43 PM

## 2023-02-12 NOTE — Progress Notes (Signed)
PROGRESS NOTE    Kristina Cruz   VOZ:[id number] DOB: [birth year]  DOA: 02/10/2023 Date of Service: 02/12/23 PCP: System, Provider Not In     Brief Narrative / Hospital Course:  Kristina Cruz is a 62 y.o. female with Past medical history of NIDDM T2, HTN, RLS, asthma, morbid obesity, lower extremity edema, as reviewed from EMR, presented at [facility] ED with complaining of shortness of breath.  As per patient she is having shortness of breath for the whole month which increased for the past few days. 06/25: = In ED, O2 sats 88%, hypoxic respiratory failure patient was placed on 4 L oxygen, wean down to 2 L. CXR consistent with peribronchial thickening, possible bronchitis.  No pneumonia. Admitted to hospitalist service.  06/26: continued tx bronchitis. Ruled out for PE/DVT.  06/27: still significant SOB. Echo done, pending read.     Consultants:  none  Procedures: none      ASSESSMENT & PLAN:   Principal Problem:   Respiratory failure with hypoxia (HCC)   Respiratory failure with hypoxia due to acute bronchitis, possible bronchopneumonia S/p supplemental O2 inhalation, gradually wean off.   Currently saturating well on room air at rest  Continue ceftriaxone and azithromycin Started Breo Ellipta inhaler, DuoNeb every 6 hourly scheduled, transition to as needed after improvement Mucinex 600 mg p.o. twice daily, Tussionex twice daily as needed COVID and RVP negative    Hypokalemia, most likely due to diuretics potassium repleted.  Resolved  Hypophosphatemia due to nutritional deficiency.  Phos repleted. Monitor electrolytes daily    Bilateral lower extremity edema could be due to cellulitis BNP within normal range Continue above antibiotics D-dimer 1.38 elevated, venous duplex for DVT neg NM perfusion scan negative for pulmonary embolism Hold diuretics for now With above antibiotics tenderness improved, patient will benefit from oral antibiotics on discharge  for 5 to 7 days   NIDDM T2, no A1c Patient remains at high risk for hypoglycemia due to steroids Held metformin for now Started NovoLog sliding scale, monitor CBG Continue diabetic diet    Hypertension, blood pressure soft, continue to monitor Started amlodipine 5 mg p.o. daily home dose with holding parameters    GERD, continue PPI RLS, continue Requip 2 mg p.o. twice daily home dose Morbid obesity, calorie restricted diet and daily exercise advised to lose body weight.  Lifestyle modification discussed. Body mass index is 52.46 kg/m.    DVT prophylaxis: lovenox  Pertinent IV fluids/nutrition: none Central lines / invasive devices: none  Code Status: FULL CODE ACP documentation reviewed: none on file   Current Admission Status: inpatient  TOC needs / Dispo plan: pend PT/OT, may need HH/O2              Subjective / Brief ROS:  Patient reports very bad cough, no t able to walk much Denies CP  Pain controlled.  Denies new weakness.  Tolerating diet.  Reports no concerns w/ urination/defecation.   Family Communication: none at this time     Objective Findings:  Vitals:   02/11/23 2350 02/12/23 0356 02/12/23 0823 02/12/23 1157  BP: 129/67 (!) 143/72 (!) 145/71 (!) 149/83  Pulse: 73 73 75 73  Resp: (!) 24 20 16 16   Temp: 97.6 F (36.4 C) 97.8 F (36.6 C) 98.6 F (37 C) 98.6 F (37 C)  TempSrc:   Oral Oral  SpO2: 98% 98% 95% 96%  Weight:      Height:        Intake/Output Summary (Last 24 hours) at  02/12/2023 1524 Last data filed at 02/12/2023 0355 Gross per 24 hour  Intake 100 ml  Output --  Net 100 ml   Filed Weights   02/10/23 1926  Weight: (!) 138.6 kg    Examination:  Physical Exam Constitutional:      General: She is not in acute distress.    Appearance: She is obese.  Cardiovascular:     Rate and Rhythm: Normal rate and regular rhythm.  Pulmonary:     Breath sounds: Examination of the right-upper field reveals wheezing.  Examination of the left-upper field reveals wheezing. Examination of the right-middle field reveals wheezing. Examination of the left-middle field reveals wheezing. Examination of the right-lower field reveals decreased breath sounds. Examination of the left-lower field reveals decreased breath sounds. Decreased breath sounds and wheezing present.  Musculoskeletal:     Right lower leg: Edema present.     Left lower leg: Edema present.  Skin:    General: Skin is warm.     Findings: Erythema (LLE) present.  Neurological:     General: No focal deficit present.     Mental Status: She is alert and oriented to person, place, and time.  Psychiatric:        Mood and Affect: Mood normal.        Behavior: Behavior normal.          Scheduled Medications:   amLODipine  5 mg Oral Daily   azithromycin  500 mg Oral Daily   chlorpheniramine-HYDROcodone  5 mL Oral QHS   enoxaparin (LOVENOX) injection  0.5 mg/kg Subcutaneous Q24H   fluticasone furoate-vilanterol  1 puff Inhalation Daily   guaiFENesin  600 mg Oral BID   insulin aspart  0-20 Units Subcutaneous TID WC   ipratropium-albuterol  3 mL Nebulization QID   methylPREDNISolone (SOLU-MEDROL) injection  40 mg Intravenous Q12H   Followed by   Melene Muller ON 02/13/2023] predniSONE  40 mg Oral Q breakfast   pantoprazole  40 mg Oral Daily   rOPINIRole  2 mg Oral BID PC   sodium chloride flush  3 mL Intravenous Q12H    Continuous Infusions:  sodium chloride     cefTRIAXone (ROCEPHIN)  IV 1 g (02/12/23 1519)    PRN Medications:  sodium chloride, acetaminophen **OR** acetaminophen, bisacodyl, chlorpheniramine-HYDROcodone, hydrALAZINE, ondansetron **OR** ondansetron (ZOFRAN) IV, sodium chloride flush  Antimicrobials from admission:  Anti-infectives (From admission, onward)    Start     Dose/Rate Route Frequency Ordered Stop   02/11/23 2200  azithromycin (ZITHROMAX) tablet 500 mg  Status:  Discontinued        500 mg Oral Daily 02/10/23 1649  02/10/23 1718   02/11/23 1600  azithromycin (ZITHROMAX) tablet 500 mg        500 mg Oral Daily 02/10/23 1718 02/15/23 2159   02/11/23 1500  cefTRIAXone (ROCEPHIN) 1 g in sodium chloride 0.9 % 100 mL IVPB        1 g 200 mL/hr over 30 Minutes Intravenous Every 24 hours 02/10/23 1718     02/10/23 1515  cefTRIAXone (ROCEPHIN) 1 g in sodium chloride 0.9 % 100 mL IVPB        1 g 200 mL/hr over 30 Minutes Intravenous  Once 02/10/23 1506 02/10/23 1625   02/10/23 1515  azithromycin (ZITHROMAX) 500 mg in sodium chloride 0.9 % 250 mL IVPB        500 mg 250 mL/hr over 60 Minutes Intravenous  Once 02/10/23 1506 02/10/23 1721  Data Reviewed:  I have personally reviewed the following...  CBC: Recent Labs  Lab 02/10/23 1407 02/10/23 1727 02/11/23 0425 02/12/23 0346  WBC 7.2 8.3 7.3 9.6  HGB 13.1 12.1 13.0 12.0  HCT 41.5 38.3 40.9 36.9  MCV 88.5 88.7 88.9 87.6  PLT 379 355 284 400   Basic Metabolic Panel: Recent Labs  Lab 02/10/23 1407 02/10/23 1727 02/11/23 0425 02/12/23 0346  NA 138  --  138 137  K 3.1*  --  4.2 3.9  CL 102  --  107 102  CO2 26  --  22 25  GLUCOSE 166*  --  271* 268*  BUN 10  --  8 14  CREATININE 0.89 0.87 0.66 0.96  CALCIUM 8.8*  --  7.8* 8.2*  MG 2.0  --  1.9 2.3  PHOS 3.7  --  2.2* 3.3   GFR: Estimated Creatinine Clearance: 82.5 mL/min (by C-G formula based on SCr of 0.96 mg/dL). Liver Function Tests: Recent Labs  Lab 02/10/23 1407  AST 14*  ALT 13  ALKPHOS 74  BILITOT 0.7  PROT 8.1  ALBUMIN 4.3   No results for input(s): "LIPASE", "AMYLASE" in the last 168 hours. No results for input(s): "AMMONIA" in the last 168 hours. Coagulation Profile: No results for input(s): "INR", "PROTIME" in the last 168 hours. Cardiac Enzymes: No results for input(s): "CKTOTAL", "CKMB", "CKMBINDEX", "TROPONINI" in the last 168 hours. BNP (last 3 results) No results for input(s): "PROBNP" in the last 8760 hours. HbA1C: Recent Labs    02/10/23 1727   HGBA1C 8.0*   CBG: Recent Labs  Lab 02/11/23 1138 02/11/23 1606 02/11/23 2024 02/12/23 0827 02/12/23 1213  GLUCAP 210* 330* 206* 275* 211*   Lipid Profile: No results for input(s): "CHOL", "HDL", "LDLCALC", "TRIG", "CHOLHDL", "LDLDIRECT" in the last 72 hours. Thyroid Function Tests: No results for input(s): "TSH", "T4TOTAL", "FREET4", "T3FREE", "THYROIDAB" in the last 72 hours. Anemia Panel: No results for input(s): "VITAMINB12", "FOLATE", "FERRITIN", "TIBC", "IRON", "RETICCTPCT" in the last 72 hours. Most Recent Urinalysis On File:     Component Value Date/Time   COLORURINE YELLOW (A) 10/16/2022 2321   APPEARANCEUR CLEAR (A) 10/16/2022 2321   APPEARANCEUR Cloudy 08/11/2013 0124   LABSPEC 1.018 10/16/2022 2321   LABSPEC 1.021 08/11/2013 0124   PHURINE 5.0 10/16/2022 2321   GLUCOSEU 50 (A) 10/16/2022 2321   GLUCOSEU Negative 08/11/2013 0124   HGBUR NEGATIVE 10/16/2022 2321   BILIRUBINUR NEGATIVE 10/16/2022 2321   BILIRUBINUR Negative 08/11/2013 0124   KETONESUR 20 (A) 10/16/2022 2321   PROTEINUR 30 (A) 10/16/2022 2321   NITRITE NEGATIVE 10/16/2022 2321   LEUKOCYTESUR NEGATIVE 10/16/2022 2321   LEUKOCYTESUR Negative 08/11/2013 0124   Sepsis Labs: @LABRCNTIP (procalcitonin:4,lacticidven:4) Microbiology: Recent Results (from the past 240 hour(s))  Blood culture (routine x 2)     Status: None (Preliminary result)   Collection Time: 02/10/23  3:06 PM   Specimen: BLOOD  Result Value Ref Range Status   Specimen Description BLOOD BLOOD LEFT ARM  Final   Special Requests   Final    BOTTLES DRAWN AEROBIC AND ANAEROBIC Blood Culture adequate volume   Culture   Final    NO GROWTH 2 DAYS Performed at [facility], [address] [place], [place] [address]    Report Status PENDING  Incomplete  Blood culture (routine x 2)     Status: None (Preliminary result)   Collection Time: 02/10/23  3:11 PM   Specimen: BLOOD  Result Value Ref Range Status  Specimen  Description BLOOD BLOOD RIGHT ARM  Final   Special Requests   Final    BOTTLES DRAWN AEROBIC AND ANAEROBIC Blood Culture adequate volume   Culture   Final    NO GROWTH 2 DAYS Performed at [facility], [address], [place], [place] [address]    Report Status PENDING  Incomplete  SARS Coronavirus 2 by RT PCR (hospital order, performed in [facility] hospital lab) *cepheid single result test* Anterior Nasal Swab     Status: None   Collection Time: 02/10/23  5:27 PM   Specimen: Anterior Nasal Swab  Result Value Ref Range Status   SARS Coronavirus 2 by RT PCR NEGATIVE NEGATIVE Final    Comment: (NOTE) SARS-CoV-2 target nucleic acids are NOT DETECTED.  The SARS-CoV-2 RNA is generally detectable in upper and lower respiratory specimens during the acute phase of infection. The lowest concentration of SARS-CoV-2 viral copies this assay can detect is 250 copies / mL. A negative result does not preclude SARS-CoV-2 infection and should not be used as the sole basis for treatment or other patient management decisions.  A negative result may occur with improper specimen collection / handling, submission of specimen other than nasopharyngeal swab, presence of viral mutation(s) within the areas targeted by this assay, and inadequate number of viral copies (<250 copies / mL). A negative result must be combined with clinical observations, patient history, and epidemiological information.  Fact Sheet for Patients:   RoadLapTop.co.za  Fact Sheet for Healthcare Providers: http://kim-miller.com/  This test is not yet approved or  cleared by the [place] FDA and has been authorized for detection and/or diagnosis of SARS-CoV-2 by FDA under an Emergency Use Authorization (EUA).  This EUA will remain in effect (meaning this test can be used) for the duration of the COVID-19 declaration under Section 564(b)(1) of the Act, 21 U.S.C. section  360bbb-3(b)(1), unless the authorization is terminated or revoked sooner.  Performed at [facility], [address] [place], [place] [address]   Respiratory (~20 pathogens) panel by PCR     Status: None   Collection Time: 02/10/23  5:27 PM   Specimen: Anterior Nasal Swab; Respiratory  Result Value Ref Range Status   Adenovirus NOT DETECTED NOT DETECTED Final   Coronavirus 229E NOT DETECTED NOT DETECTED Final    Comment: (NOTE) The Coronavirus on the Respiratory Panel, DOES NOT test for the novel  Coronavirus (2019 nCoV)    Coronavirus HKU1 NOT DETECTED NOT DETECTED Final   Coronavirus NL63 NOT DETECTED NOT DETECTED Final   Coronavirus OC43 NOT DETECTED NOT DETECTED Final   Metapneumovirus NOT DETECTED NOT DETECTED Final   Rhinovirus / Enterovirus NOT DETECTED NOT DETECTED Final   Influenza A NOT DETECTED NOT DETECTED Final   Influenza B NOT DETECTED NOT DETECTED Final   Parainfluenza Virus 1 NOT DETECTED NOT DETECTED Final   Parainfluenza Virus 2 NOT DETECTED NOT DETECTED Final   Parainfluenza Virus 3 NOT DETECTED NOT DETECTED Final   Parainfluenza Virus 4 NOT DETECTED NOT DETECTED Final   Respiratory Syncytial Virus NOT DETECTED NOT DETECTED Final   Bordetella pertussis NOT DETECTED NOT DETECTED Final   Bordetella Parapertussis NOT DETECTED NOT DETECTED Final   Chlamydophila pneumoniae NOT DETECTED NOT DETECTED Final   Mycoplasma pneumoniae NOT DETECTED NOT DETECTED Final    Comment: Performed at [facility], [address] [place], [place] [address]      Radiology Studies last 3 days: NM Pulmonary Perfusion  Result  Date: 02/11/2023 CLINICAL DATA:  Pulmonary embolism suspected, high probability. Shortness of breath. History of asthma. EXAM: NUCLEAR MEDICINE PERFUSION LUNG SCAN TECHNIQUE: Perfusion images were obtained in multiple projections after intravenous injection of radiopharmaceutical. Ventilation scans intentionally deferred if perfusion scan and  chest x-ray adequate for interpretation since COVID 19 epidemic. RADIOPHARMACEUTICALS:  4.64 mCi Tc-38m MAA IV COMPARISON:  Chest radiograph 02/10/2023. Lower extremity venous Doppler ultrasound 02/10/2023. FINDINGS: There are no wedge-shaped perfusion defects to suggest pulmonary embolism. The pulmonary ventilation is within normal limits. IMPRESSION: No evidence of acute pulmonary embolism on perfusion scintigraphy by PISAPED criteria. Electronically Signed   By: Carey Bullocks M.D.   On: 02/11/2023 10:37   US Venous Img Lower Bilateral (DVT)  Result Date: 02/10/2023 CLINICAL DATA:  Bilateral lower extremity edema. EXAM: BILATERAL LOWER EXTREMITY VENOUS DOPPLER ULTRASOUND TECHNIQUE: Gray-scale sonography with compression, as well as color and duplex ultrasound, were performed to evaluate the deep venous system(s) from the level of the common femoral vein through the popliteal and proximal calf veins. COMPARISON:  Left lower extremity venous Doppler ultrasound 06/30/2016 FINDINGS: VENOUS Normal compressibility of the common femoral, superficial femoral, and popliteal veins, as well as the visualized calf veins (with the calf veins being poorly visualized bilaterally due to body habitus). Visualized portions of profunda femoral vein and great saphenous vein unremarkable. No filling defects to suggest DVT on grayscale or color Doppler imaging. Doppler waveforms show normal direction of venous flow, normal respiratory plasticity and response to augmentation. OTHER None. Limitations: Body habitus IMPRESSION: No evidence of DVT in either lower extremity. Electronically Signed   By: Sebastian Ache M.D.   On: 02/10/2023 18:29   DG Chest 2 View  Result Date: 02/10/2023 CLINICAL DATA:  COPD EXAM: CHEST - 2 VIEW COMPARISON:  X-ray 02/16/2015 and CT angiogram. Older x-rays as well FINDINGS: Hyperinflation. Under penetrated radiograph with enlarged cardiopericardial silhouette. Peribronchial thickening. No  consolidation, pneumothorax or effusion. No edema. Degenerative changes seen of the spine. Motion on lateral view. IMPRESSION: Enlarged heart with peribronchial thickening. Electronically Signed   By: Karen Kays M.D.   On: 02/10/2023 14:06             LOS: 2 days      Sunnie Nielsen, DO [facility] 02/12/2023, 3:24 PM    Dictation software may have been used to generate the above note. Typos may occur and escape review in typed/dictated notes. Please contact Dr Lyn Hollingshead directly for clarity if needed.  Staff may message me via secure chat in Epic  but this may not receive an immediate response,  please page me for urgent matters!  If 7PM-7AM, please contact night coverage [email or web address]

## 2023-02-12 NOTE — Hospital Course (Addendum)
Kristina Cruz is a 62 y.o. female with Past medical history of NIDDM T2, HTN, RLS, asthma, morbid obesity, lower extremity edema, as reviewed from EMR, presented at [facility] ED with complaining of shortness of breath.  As per patient she is having shortness of breath for the whole month which increased for the past few days. 06/25: = In ED, O2 sats 88%, hypoxic respiratory failure patient was placed on 4 L oxygen, wean down to 2 L. CXR consistent with peribronchial thickening, possible bronchitis.  No pneumonia. Admitted to hospitalist service.  06/26: continued tx bronchitis. Ruled out for PE/DVT.  06/27: still significant SOB. Echo done, pending read.  06/28: Echo EF 60-65, mild LVH, normal diastolic fxn,     Consultants:  none  Procedures: none      ASSESSMENT & PLAN:   Principal Problem:   Respiratory failure with hypoxia (HCC)   Respiratory failure with hypoxia due to acute bronchitis, possible bronchopneumonia S/p supplemental O2 inhalation, gradually wean off.   Currently saturating well on room air at rest  Continue ceftriaxone and azithromycin Started Breo Ellipta inhaler, DuoNeb every 6 hourly scheduled, transition to as needed after improvement Mucinex 600 mg p.o. twice daily, Tussionex twice daily as needed COVID and RVP negative    Hypokalemia, most likely due to diuretics potassium repleted.  Resolved  Hypophosphatemia due to nutritional deficiency.  Phos repleted. Monitor electrolytes daily    Bilateral lower extremity edema could be due to cellulitis BNP within normal range Continue above antibiotics D-dimer 1.38 elevated, venous duplex for DVT neg NM perfusion scan negative for pulmonary embolism Hold diuretics for now With above antibiotics tenderness improved, patient will benefit from oral antibiotics on discharge for 5 to 7 days   NIDDM T2, no A1c Patient remains at high risk for hypoglycemia due to steroids Held metformin for now Started NovoLog  sliding scale, monitor CBG Continue diabetic diet    Hypertension, blood pressure soft, continue to monitor Started amlodipine 5 mg p.o. daily home dose with holding parameters    GERD, continue PPI RLS, continue Requip 2 mg p.o. twice daily home dose Morbid obesity, calorie restricted diet and daily exercise advised to lose body weight.  Lifestyle modification discussed. Body mass index is 52.46 kg/m.    DVT prophylaxis: lovenox  Pertinent IV fluids/nutrition: none Central lines / invasive devices: none  Code Status: FULL CODE ACP documentation reviewed: none on file   Current Admission Status: inpatient  TOC needs / Dispo plan: pend PT/OT, may need HH/O2

## 2023-02-12 NOTE — Progress Notes (Signed)
Per Dr Alexander, dc tele monitoring  

## 2023-02-12 NOTE — Inpatient Diabetes Management (Signed)
Inpatient Diabetes Program Recommendations  AACE/ADA: New Consensus Statement on Inpatient Glycemic Control   Target Ranges:  Prepandial:   less than 140 mg/dL      Peak postprandial:   less than 180 mg/dL (1-2 hours)      Critically ill patients:  140 - 180 mg/dL    Latest Reference Range & Units 02/11/23 08:13 02/11/23 11:38 02/11/23 16:06 02/11/23 20:24 02/12/23 08:27  Glucose-Capillary 70 - 99 mg/dL 981 (H) 191 (H) 478 (H) 206 (H) 275 (H)   Review of Glycemic Control  Diabetes history: DM2 Outpatient Diabetes medications: Metformin 1000 mg BID, Ozempic 0.5 mg Qweek Current orders for Inpatient glycemic control: Novolog 0-20 units TID with meals; Solumedrol 40 mg Q12H, tapering to Prednisone 40 mg QAM on 6/28  Inpatient Diabetes Program Recommendations:    Insulin: If steroids are continued, please consider ordering Semglee 14 units Q24H (based on 138.6 kg x 0.1 units).  Thanks, Orlando Penner, RN, MSN, CDCES Diabetes Coordinator Inpatient Diabetes Program [phone] (Team Pager from 8am to 5pm)

## 2023-02-12 NOTE — Plan of Care (Signed)

## 2023-02-12 NOTE — Progress Notes (Signed)
*  PRELIMINARY RESULTS* Echocardiogram 2D Echocardiogram has been performed.  Carolyne Fiscal 02/12/2023, 2:58 PM

## 2023-02-12 NOTE — Plan of Care (Signed)

## 2023-02-13 DIAGNOSIS — J9601 Acute respiratory failure with hypoxia: Secondary | ICD-10-CM | POA: Diagnosis not present

## 2023-02-13 LAB — BASIC METABOLIC PANEL
Anion gap: 10 (ref 5–15)
BUN: 20 mg/dL (ref 8–23)
CO2: 27 mmol/L (ref 22–32)
Calcium: 8.5 mg/dL — ABNORMAL LOW (ref 8.9–10.3)
Chloride: 100 mmol/L (ref 98–111)
Creatinine, Ser: 0.9 mg/dL (ref 0.44–1.00)
GFR, Estimated: >60 mL/min (ref >60)
Glucose, Bld: 355 mg/dL — ABNORMAL HIGH (ref 70–99)
Potassium: 4.4 mmol/L (ref 3.5–5.1)
Sodium: 137 mmol/L (ref 135–145)

## 2023-02-13 LAB — CBC
HCT: 38.7 % (ref 36.0–46.0)
Hemoglobin: 12.3 g/dL (ref 12.0–15.0)
MCH: 28.1 pg (ref 26.0–34.0)
MCHC: 31.8 g/dL (ref 30.0–36.0)
MCV: 88.4 fL (ref 80.0–100.0)
Platelets: 392 10*3/uL (ref 150–400)
RBC: 4.38 MIL/uL (ref 3.87–5.11)
RDW: 14.7 % (ref 11.5–15.5)
WBC: 8 10*3/uL (ref 4.0–10.5)
nRBC: 0 % (ref 0.0–0.2)

## 2023-02-13 LAB — PHOSPHORUS: Phosphorus: 3.2 mg/dL (ref 2.5–4.6)

## 2023-02-13 LAB — GLUCOSE, CAPILLARY
Glucose-Capillary: 289 mg/dL — ABNORMAL HIGH (ref 70–99)
Glucose-Capillary: 159 mg/dL — ABNORMAL HIGH (ref 70–99)
Glucose-Capillary: 301 mg/dL — ABNORMAL HIGH (ref 70–99)

## 2023-02-13 LAB — MAGNESIUM: Magnesium: 2.5 mg/dL — ABNORMAL HIGH (ref 1.7–2.4)

## 2023-02-13 MED ORDER — GUAIFENESIN ER 600 MG PO TB12: 600.0000 mg | ORAL_TABLET | Freq: Two times a day (BID) | ORAL | 0 refills | Status: DC | PRN

## 2023-02-13 MED ORDER — INSULIN ASPART 100 UNIT/ML IJ SOLN
5.0000 [IU] | Freq: Three times a day (TID) | INTRAMUSCULAR | Status: DC
  Administered 2023-02-13 (×2): 5 [IU] via SUBCUTANEOUS
  Filled 2023-02-13 (×2): qty 1

## 2023-02-13 MED ORDER — DIPHENHYDRAMINE HCL 25 MG PO CAPS: 50.0000 mg | ORAL_CAPSULE | Freq: Four times a day (QID) | ORAL | Status: DC | PRN

## 2023-02-13 MED ORDER — FUROSEMIDE 40 MG PO TABS
40.0000 mg | ORAL_TABLET | Freq: Every day | ORAL | Status: DC
  Administered 2023-02-13: 40 mg via ORAL
  Filled 2023-02-13: qty 1

## 2023-02-13 MED ORDER — HYDROCOD POLI-CHLORPHE POLI ER 10-8 MG/5ML PO SUER: 5.0000 mL | Freq: Two times a day (BID) | ORAL | 0 refills | Status: DC | PRN

## 2023-02-13 MED ORDER — GABAPENTIN 300 MG PO CAPS
600.0000 mg | ORAL_CAPSULE | Freq: Two times a day (BID) | ORAL | Status: DC
  Administered 2023-02-13: 600 mg via ORAL
  Filled 2023-02-13: qty 2

## 2023-02-13 MED ORDER — PREDNISONE 10 MG PO TABS: ORAL_TABLET | ORAL | 0 refills | Status: AC

## 2023-02-13 MED ORDER — VENTOLIN HFA 108 (90 BASE) MCG/ACT IN AERS: 2.0000 | INHALATION_SPRAY | RESPIRATORY_TRACT | 0 refills | Status: DC | PRN

## 2023-02-13 MED ORDER — ATORVASTATIN CALCIUM 20 MG PO TABS
20.0000 mg | ORAL_TABLET | Freq: Every day | ORAL | Status: DC
  Administered 2023-02-13: 20 mg via ORAL
  Filled 2023-02-13: qty 1

## 2023-02-13 MED ORDER — FLUTICASONE FUROATE-VILANTEROL 100-25 MCG/ACT IN AEPB: 1.0000 | INHALATION_SPRAY | Freq: Every day | RESPIRATORY_TRACT | 0 refills | Status: DC

## 2023-02-13 MED ORDER — AZITHROMYCIN 500 MG PO TABS: 500.0000 mg | ORAL_TABLET | Freq: Every day | ORAL | 0 refills | Status: AC

## 2023-02-13 MED ORDER — AMLODIPINE BESYLATE 10 MG PO TABS: 10.0000 mg | ORAL_TABLET | Freq: Every day | ORAL | Status: DC

## 2023-02-13 MED ORDER — AMOXICILLIN-POT CLAVULANATE 500-125 MG PO TABS: 1.0000 | ORAL_TABLET | Freq: Two times a day (BID) | ORAL | 0 refills | Status: AC

## 2023-02-13 MED ORDER — IPRATROPIUM-ALBUTEROL 0.5-2.5 (3) MG/3ML IN SOLN: 3.0000 mL | RESPIRATORY_TRACT | 0 refills | Status: DC | PRN

## 2023-02-13 MED ORDER — BACLOFEN 10 MG PO TABS
10.0000 mg | ORAL_TABLET | Freq: Three times a day (TID) | ORAL | Status: DC
  Administered 2023-02-13: 10 mg via ORAL
  Filled 2023-02-13: qty 1

## 2023-02-13 MED ORDER — IPRATROPIUM-ALBUTEROL 0.5-2.5 (3) MG/3ML IN SOLN
3.0000 mL | Freq: Three times a day (TID) | RESPIRATORY_TRACT | Status: DC
  Administered 2023-02-13: 3 mL via RESPIRATORY_TRACT
  Filled 2023-02-13: qty 3

## 2023-02-13 NOTE — TOC Transition Note (Signed)
Transition of Care [facility]) - CM/SW Discharge Note   Patient Details  Name: Kristina Cruz MRN: [id number] Date of Birth: 01-18-1961  [facility] CM/SW Contact:  Allena Katz, LCSW Phone Number: 02/13/2023, 10:17 AM   Clinical Narrative:   Pt has orders to discharge home. Resumption orders for [facility] entered for PT/OT for [facility]. Bariatric RW ordered through [employer] to be delivered to patients room.     Final next level of care: Home w Home Health Services Barriers to Discharge: Barriers Resolved   Patient Goals and CMS Choice CMS Medicare.gov Compare Post Acute Care list provided to:: Patient    Discharge Placement                         Discharge Plan and Services Additional resources added to the After Visit Summary for                  DME Arranged:  (Bariatric RW) DME Agency: [employer]       HH Arranged: PT, OT   Date HH Agency Contacted: 02/13/23 Time HH Agency Contacted: 1000 Representative spoke with at [facility] Agency: [place]  Social Determinants of Health (SDOH) Interventions SDOH Screenings   Food Insecurity: No Food Insecurity (02/11/2023)  Housing: Low Risk  (02/11/2023)  Transportation Needs: No Transportation Needs (02/11/2023)  Utilities: Not At Risk (02/11/2023)  Tobacco Use: Low Risk  (02/11/2023)     Readmission Risk Interventions     No data to display

## 2023-02-13 NOTE — TOC Transition Note (Signed)
Transition of Care [facility]) - CM/SW Discharge Note   Patient Details  Name: Elberta Calia MRN: [id number] Date of Birth: 1960/10/07  Transition of Care [facility]) CM/SW Contact:  Allena Katz, LCSW Phone Number: 02/13/2023, 5:34 PM   Clinical Narrative:   Pt discharging home with [facility]. Nebulizer and bariatric RW ordered and to be delivered to patients room.     Final next level of care: Home w Home Health Services Barriers to Discharge: Barriers Resolved   Patient Goals and CMS Choice CMS Medicare.gov Compare Post Acute Care list provided to:: Patient    Discharge Placement                         Discharge Plan and Services Additional resources added to the After Visit Summary for                  DME Arranged:  (Bariatric RW) DME Agency: [employer]       HH Arranged: PT, OT   Date HH Agency Contacted: 02/13/23 Time HH Agency Contacted: 1000 Representative spoke with at [facility] Agency: [place]  Social Determinants of Health (SDOH) Interventions SDOH Screenings   Food Insecurity: No Food Insecurity (02/11/2023)  Housing: Low Risk  (02/11/2023)  Transportation Needs: No Transportation Needs (02/11/2023)  Utilities: Not At Risk (02/11/2023)  Tobacco Use: Low Risk  (02/11/2023)     Readmission Risk Interventions     No data to display

## 2023-02-13 NOTE — Evaluation (Signed)
Physical Therapy Evaluation Patient Details Name: Kristina Cruz MRN: [id number] DOB: January 21, 1961 Today's Date: 02/13/2023  History of Present Illness  Pt is a 62 y.o. female presenting to hospital 6/25 with c/o SOB.  Pt admitted with respiratory failure with hypoxia d/t acute bronchitis (possible bronchopneumonia), hypokalemia, B LE edema, and htn.  PMH includes anemia, asthma, DM, gastric reflux, htn, morbid obesity, LE edema, RLS.  Clinical Impression  Prior to hospital admission, pt was modified independent ambulating with walker; lives alone in 1 level home with steps to enter; pt reports having home health aides 3 hours/day (7 days a week).  Currently pt is SBA with transfers and CGA to ambulate 100 feet with RW use.  Pt walking in forward flexed trunk posture (pt reports this is how she typically walks) and walker height elevated per pt request d/t pt reporting always walking with taller walker; pt steady ambulating with walker use.  Pt's HR 84 bpm at rest but increased up to 150 bpm with ambulation (HR decreased to around 120 bpm at rest post activity) and SpO2 sats 94% or greater on room air during sessions activities (MD and pt's nurse updated on pt's vitals and mobility).  Deferred further activity d/t noted elevated HR.  Pt would currently benefit from skilled PT to address noted impairments and functional limitations (see below for any additional details).  Upon hospital discharge, pt would benefit from ongoing therapy.  Discussed concerns regarding pt being able to navigate steps into home safely (pt reports normally requiring 2 assist for stairs management) but pt reported she would have assist and was not worried (pt declined SNF--pt requesting HHPT). TOC notified pt needing bariatric RW for home use (pt reports hers is broken) and HHPT needs (pt declining SNF).   Recommendations for follow up therapy are one component of a multi-disciplinary discharge planning process, led by the  attending physician.  Recommendations may be updated based on patient status, additional functional criteria and insurance authorization.  Follow Up Recommendations       Assistance Recommended at Discharge Intermittent Supervision/Assistance  Patient can return home with the following  A little help with walking and/or transfers;A little help with bathing/dressing/bathroom;Assistance with cooking/housework;Assist for transportation;Help with stairs or ramp for entrance    Equipment Recommendations Rolling walker (2 wheels) (Bariatric)  Recommendations for Other Services       Functional Status Assessment Patient has had a recent decline in their functional status and demonstrates the ability to make significant improvements in function in a reasonable and predictable amount of time.     Precautions / Restrictions Precautions Precautions: Fall Restrictions Weight Bearing Restrictions: No      Mobility  Bed Mobility               General bed mobility comments: Deferred (pt sitting on edge of bed beginning/end of session)    Transfers Overall transfer level: Needs assistance Equipment used: Rolling walker (2 wheels) Transfers: Sit to/from Stand Sit to Stand: Supervision           General transfer comment: x2 trials standing from bed; increased effort to stand    Ambulation/Gait Ambulation/Gait assistance: Min guard Gait Distance (Feet): 100 Feet Assistive device: Rolling walker (2 wheels)   Gait velocity: decreased     General Gait Details: pt in forward flexed trunk posture (pt reports this is how she normally walks); partial step through gait pattern; steady with walker use; walker height elevated per pt request d/t pt reporting always walking with taller  walker  Stairs            Wheelchair Mobility    Modified Rankin (Stroke Patients Only)       Balance Overall balance assessment: Needs assistance Sitting-balance support: No upper  extremity supported, Feet supported Sitting balance-Leahy Scale: Normal Sitting balance - Comments: steady reaching outside BOS   Standing balance support: Single extremity supported Standing balance-Leahy Scale: Fair Standing balance comment: steady static standing with at least single UE support                             Pertinent Vitals/Pain Pain Assessment Pain Assessment: Faces Faces Pain Scale: No hurt Pain Intervention(s): Limited activity within patient's tolerance, Monitored during session, Repositioned    Home Living Family/patient expects to be discharged to:: Private residence Living Arrangements: Alone Available Help at Discharge: Family;Personal care attendant;Available PRN/intermittently Type of Home: Apartment Home Access: Stairs to enter   Entrance Stairs-Number of Steps: 1 step to enter apt building (no railing) and then 3-4 steps with B railing (unable to reach both at same time) to enter home   Home Layout: One level Home Equipment:  (pt reports having RW and rollator but both are broken)      Prior Function Prior Level of Function : Needs assist             Mobility Comments: Pt reports being modified independent ambulating with RW (limited distances d/t SOB/weakness); requires 2 assist typically with stairs; no home O2 use; had been receiving home health services working on strengthening her back and legs ADLs Comments: Pt reports having home health aid 3 hours/day (7 days a week); has assist in/out of shower     Hand Dominance        Extremity/Trunk Assessment   Upper Extremity Assessment Upper Extremity Assessment: Defer to OT evaluation;Overall WFL for tasks assessed    Lower Extremity Assessment Lower Extremity Assessment: Generalized weakness    Cervical / Trunk Assessment Cervical / Trunk Assessment: Other exceptions Cervical / Trunk Exceptions: forward head/shoulders  Communication   Communication: No difficulties   Cognition Arousal/Alertness: Awake/alert Behavior During Therapy: WFL for tasks assessed/performed Overall Cognitive Status: Within Functional Limits for tasks assessed                                          General Comments  Nursing cleared pt for participation in physical therapy.  Pt agreeable to PT session.    Exercises     Assessment/Plan    PT Assessment Patient needs continued PT services  PT Problem List Decreased strength;Decreased activity tolerance;Decreased balance;Decreased mobility;Cardiopulmonary status limiting activity       PT Treatment Interventions DME instruction;Gait training;Stair training;Functional mobility training;Therapeutic activities;Therapeutic exercise;Balance training;Patient/family education    PT Goals (Current goals can be found in the Care Plan section)  Acute Rehab PT Goals Patient Stated Goal: to improve strength and mobility PT Goal Formulation: With patient Time For Goal Achievement: 02/27/23 Potential to Achieve Goals: Good    Frequency Min 3X/week     Co-evaluation               AM-PAC PT "6 Clicks" Mobility  Outcome Measure Help needed turning from your back to your side while in a flat bed without using bedrails?: None Help needed moving from lying on your back to sitting on  the side of a flat bed without using bedrails?: None Help needed moving to and from a bed to a chair (including a wheelchair)?: A Little Help needed standing up from a chair using your arms (e.g., wheelchair or bedside chair)?: A Little Help needed to walk in hospital room?: A Little Help needed climbing 3-5 steps with a railing? : A Lot 6 Click Score: 19    End of Session Equipment Utilized During Treatment:  (pt refused gait belt) Activity Tolerance: Patient limited by fatigue;Other (comment) (Limited d/t elevated HR with activity) Patient left: Other (comment);with call bell/phone within reach;with bed alarm set (sitting on  edge of bed per pt request) Nurse Communication: Mobility status;Precautions;Other (comment) (pt's HR and SpO2 sats during session) PT Visit Diagnosis: Other abnormalities of gait and mobility (R26.89);Muscle weakness (generalized) (M62.81)    Time: 1610-9604 PT Time Calculation (min) (ACUTE ONLY): 29 min   Charges:   PT Evaluation $PT Eval Low Complexity: 1 Low PT Treatments $Therapeutic Activity: 8-22 mins       Hendricks Limes, PT 02/13/23, 10:11 AM

## 2023-02-13 NOTE — TOC CM/SW Note (Signed)
Transition of Care [facility]) - Inpatient Brief Assessment   Patient Details  Name: Kristina Cruz MRN: [id number] Date of Birth: 08-31-1960  Transition of Care [facility]) CM/SW Contact:    Allena Katz, LCSW Phone Number: 02/13/2023, 7:50 AM   Clinical Narrative:   Pt admitted with difficulty breathing, from home, and was placed on oxygen at admission. Pt active with Dr. Neita Goodnight for Primary Care. PT/OT has been ordered for patient TOC to continue to follow. Per patient [name], pt was active with [facility] until 02/06/2023.     Transition of Care Asessment: Insurance and Status: Insurance coverage has been reviewed Patient has primary care physician: Yes Home environment has been reviewed: [address] [place] [id number] Prior level of function:: Walks with assist Prior/Current Home Services: Current home services ([facility] discharged pt on 02/06/2023 per patient ping) Social Determinants of Health Reivew: SDOH reviewed no interventions necessary Readmission risk has been reviewed: Yes Transition of care needs: transition of care needs identified, TOC will continue to follow

## 2023-02-13 NOTE — Progress Notes (Signed)
Received MD order to discharge patient to home. I reviewed discharge instructions, home meds, follow up appointments and prescriptions with patient and patient verbalized understanding.  Awaiting nebulizer machine to be delivered to room prior to discharge.

## 2023-02-13 NOTE — Inpatient Diabetes Management (Signed)
Inpatient Diabetes Program Recommendations  AACE/ADA: New Consensus Statement on Inpatient Glycemic Control   Target Ranges:  Prepandial:   less than 140 mg/dL      Peak postprandial:   less than 180 mg/dL (1-2 hours)      Critically ill patients:  140 - 180 mg/dL    Latest Reference Range & Units 02/12/23 08:27 02/12/23 12:13 02/12/23 15:57 02/12/23 20:44 02/13/23 07:52  Glucose-Capillary 70 - 99 mg/dL 295 (H) 284 (H) 132 (H) 251 (H) 289 (H)  (H): Data is abnormally high Review of Glycemic Control  Diabetes history: DM2 Outpatient Diabetes medications: Metformin 1000 mg BID, Ozempic 0.5 mg Qweek Current orders for Inpatient glycemic control: Novolog 0-20 units TID with meals; Prednisone 40 mg QAM    Inpatient Diabetes Program Recommendations:     Insulin: CBGs ranged from 211-289 mg/dl over the past 24 hours. Noted steroids changed from Solumedrol to Prednisone.  If steroids are continued, please consider ordering Semglee 7 units Q24H.   Thanks, Orlando Penner, RN, MSN, CDCES Diabetes Coordinator Inpatient Diabetes Program [phone] (Team Pager from 8am to 5pm)

## 2023-02-13 NOTE — Evaluation (Signed)
Occupational Therapy Evaluation Patient Details Name: Kristina Cruz MRN: [id number] DOB: [birth year] Today's Date: 02/13/2023   History of Present Illness Pt is a 62 y.o. female presenting to hospital 6/25 with c/o SOB.  Pt admitted with respiratory failure with hypoxia d/t acute bronchitis (possible bronchopneumonia), hypokalemia, B LE edema, and htn.  PMH includes anemia, asthma, DM, gastric reflux, htn, morbid obesity, LE edema, RLS.   Clinical Impression   Patient presenting with decreased Ind in self care,balance, functional mobility/transfers, endurance, and safety awareness. Patient reports being Ind with short distance ambulation but AD is broken. Pt has a friend that comes every morning to assist with shower and a PCA that comes 3hrs/7xwk for IDLs.  Patient currently functioning at supervision overall for mobility and functional transfers. Pt does have difficulty with steps in and out of apartment but reports having assistance and does not leave home often. She sleeps in recliner chair. Patient will benefit from acute OT to increase overall independence in the areas of ADLs, functional mobility, and safety awareness in order to safely discharge.     Recommendations for follow up therapy are one component of a multi-disciplinary discharge planning process, led by the attending physician.  Recommendations may be updated based on patient status, additional functional criteria and insurance authorization.   Assistance Recommended at Discharge Intermittent Supervision/Assistance  Patient can return home with the following A little help with bathing/dressing/bathroom;Assistance with cooking/housework;Assist for transportation;Help with stairs or ramp for entrance    Functional Status Assessment  Patient has had a recent decline in their functional status and demonstrates the ability to make significant improvements in function in a reasonable and predictable amount of time.  Equipment  Recommendations  Other (comment) (bariatric RW)       Precautions / Restrictions Precautions Precautions: Fall Restrictions Weight Bearing Restrictions: No      Mobility Bed Mobility               General bed mobility comments: Deferred (pt sitting on edge of bed beginning/end of session). Pt sleeps in recliner chair at home.    Transfers Overall transfer level: Needs assistance Equipment used: Rolling walker (2 wheels) Transfers: Sit to/from Stand Sit to Stand: Supervision                  Balance Overall balance assessment: Needs assistance Sitting-balance support: No upper extremity supported, Feet supported Sitting balance-Leahy Scale: Normal     Standing balance support: Single extremity supported, Reliant on assistive device for balance Standing balance-Leahy Scale: Fair                             ADL either performed or assessed with clinical judgement   ADL Overall ADL's : Needs assistance/impaired     Grooming: Wash/dry hands;Wash/dry face;Standing;Supervision/safety                   Toilet Transfer: Supervision/safety;Min guard;Rolling walker (2 wheels) Statistician Details (indicate cue type and reason): simulated                 Vision Patient Visual Report: No change from baseline              Pertinent Vitals/Pain Pain Assessment Pain Assessment: No/denies pain     Hand Dominance Right   Extremity/Trunk Assessment Upper Extremity Assessment Upper Extremity Assessment: Overall WFL for tasks assessed   Lower Extremity Assessment Lower Extremity Assessment: Generalized weakness   Cervical /  Trunk Assessment Cervical / Trunk Assessment: Other exceptions Cervical / Trunk Exceptions: forward head/shoulders   Communication Communication Communication: No difficulties   Cognition Arousal/Alertness: Awake/alert Behavior During Therapy: WFL for tasks assessed/performed Overall Cognitive Status:  Within Functional Limits for tasks assessed                                                  Home Living Family/patient expects to be discharged to:: Private residence Living Arrangements: Alone Available Help at Discharge: Family;Personal care attendant;Available PRN/intermittently Type of Home: Apartment Home Access: Stairs to enter Entrance Stairs-Number of Steps: 1 step to enter apt building (no railing) and then 3-4 steps with B railing (unable to reach both at same time) to enter home   Home Layout: One level     Bathroom Shower/Tub: Chief Strategy Officer: Standard     Home Equipment: None   Additional Comments: equipment is broken and she is unable to use      Prior Functioning/Environment Prior Level of Function : Needs assist             Mobility Comments: Pt reports being modified independent ambulating with RW (limited distances d/t SOB/weakness); requires 2 assist typically with stairs; no home O2 use; had been receiving home health services working on strengthening her back and legs ADLs Comments: Pt reports having home health aid 3 hours/day (7 days a week) to assist with IADLs. She has a friend that comes at [facility] every morning to help with shower.        OT Problem List: Decreased strength;Decreased range of motion;Decreased cognition;Decreased activity tolerance;Decreased safety awareness;Impaired balance (sitting and/or standing);Decreased knowledge of use of DME or AE      OT Treatment/Interventions: Self-care/ADL training;Therapeutic exercise;Therapeutic activities;Energy conservation;DME and/or AE instruction;Patient/family education;Balance training    OT Goals(Current goals can be found in the care plan section) Acute Rehab OT Goals Patient Stated Goal: to return home OT Goal Formulation: With patient Time For Goal Achievement: 02/27/23 Potential to Achieve Goals: Fair ADL Goals Pt Will Perform Upper Body Bathing:  sitting;with set-up Pt Will Perform Lower Body Dressing: sit to/from stand;with min assist Pt Will Transfer to Toilet: with supervision;ambulating Pt Will Perform Toileting - Clothing Manipulation and hygiene: with supervision;sit to/from stand  OT Frequency: Min 2X/week       AM-PAC OT "6 Clicks" Daily Activity     Outcome Measure Help from another person eating meals?: None Help from another person taking care of personal grooming?: None Help from another person toileting, which includes using toliet, bedpan, or urinal?: A Little Help from another person bathing (including washing, rinsing, drying)?: A Little Help from another person to put on and taking off regular upper body clothing?: None Help from another person to put on and taking off regular lower body clothing?: A Little 6 Click Score: 21   End of Session Equipment Utilized During Treatment: Rolling walker (2 wheels) Nurse Communication: Mobility status  Activity Tolerance: Patient tolerated treatment well Patient left: in bed;with call bell/phone within reach;with bed alarm set  OT Visit Diagnosis: Unsteadiness on feet (R26.81);Repeated falls (R29.6);Muscle weakness (generalized) (M62.81)                Time: 0272-5366 OT Time Calculation (min): 14 min Charges:  OT General Charges $OT Visit: 1 Visit OT Evaluation $OT Eval Low Complexity:  1 Low OT Treatments $Therapeutic Activity: 8-22 mins  Jackquline Denmark, MS, OTR/L , CBIS ascom [phone]  02/13/23, 12:43 PM

## 2023-02-14 NOTE — Discharge Summary (Signed)
Physician Discharge Summary   Patient: Kristina Cruz MRN: [id number]  DOB: 07/03/1961   Admit:     Date of Admission: 02/10/2023 Admitted from: home   Discharge: Date of discharge: 02/13/2023 Disposition: Home Condition at discharge: good  CODE STATUS: FULL CODE     Discharge Physician: Sunnie Nielsen, DO [facility]     PCP: System, Provider Not In  Recommendations for Outpatient Follow-up:  Follow up with PCP System, Provider Not In in 1-2 weeks Please obtain labs/tests: CBC, BMP in 1-2 weeks Please follow up on the following pending results: none PCP AND OTHER OUTPATIENT PROVIDERS: SEE BELOW FOR SPECIFIC DISCHARGE INSTRUCTIONS PRINTED FOR PATIENT IN ADDITION TO GENERIC AVS PATIENT INFO     Discharge Instructions     Diet - low sodium heart healthy   Complete by: As directed    Discharge patient   Complete by: As directed    Discharge disposition: 01-Home or Self Care   Discharge patient date: 02/13/2023   For home use only DME Nebulizer machine   Complete by: As directed    Patient needs a nebulizer to treat with the following condition: COPD (chronic obstructive pulmonary disease) (HCC)   Length of Need: Lifetime   Increase activity slowly   Complete by: As directed          Discharge Diagnoses: Principal Problem:   Respiratory failure with hypoxia [facility])       Hospital Course: Kristina Cruz is a 62 y.o. female with Past medical history of NIDDM T2, HTN, RLS, asthma, morbid obesity, lower extremity edema, as reviewed from EMR, presented at [facility] ED with complaining of shortness of breath.  As per patient she is having shortness of breath for the whole month which increased for the past few days. 06/25: = In ED, O2 sats 88%, hypoxic respiratory failure patient was placed on 4 L oxygen, wean down to 2 L. CXR consistent with peribronchial thickening, possible bronchitis.  No pneumonia. Admitted to hospitalist service.  06/26:  continued tx bronchitis. Ruled out for PE/DVT.  06/27: still significant SOB. Echo done, pending read.  06/28: Echo EF 60-65, mild LVH, normal diastolic fxn. SOB on exertion but O2 WNL, Has DME at home to help w/ ambulation, pt ok for discharge     Consultants:  none  Procedures: none      ASSESSMENT & PLAN:    Respiratory failure with hypoxia due to acute bronchitis, possible bronchopneumonia - resp failure resolved  S/p supplemental O2 inhalation, gradually wean off.   Currently saturating well on room air at rest  Continue ceftriaxone and azithromycin Started Breo Ellipta inhaler, DuoNeb every 6 hourly scheduled, transition to as needed after improvement Mucinex 600 mg p.o. twice daily, Tussionex twice daily as needed COVID and RVP negative    Hypokalemia, most likely due to diuretics potassium repleted.  Resolved  Hypophosphatemia due to nutritional deficiency.  Phos repleted. Monitor electrolytes daily    Bilateral lower extremity edema could be due to cellulitis BNP within normal range Continue above antibiotics D-dimer 1.38 elevated, venous duplex for DVT neg NM perfusion scan negative for pulmonary embolism Hold diuretics for now With above antibiotics tenderness improved, patient will benefit from oral antibiotics on discharge for 5 to 7 days   NIDDM T2, no A1c Patient remains at high risk for hypoglycemia due to steroids Held metformin for now Started NovoLog sliding scale, monitor CBG Continue diabetic diet    Hypertension, blood pressure soft, continue to monitor Started amlodipine  5 mg p.o. daily home dose with holding parameters    GERD, continue PPI RLS, continue Requip 2 mg p.o. twice daily home dose Morbid obesity, calorie restricted diet and daily exercise advised to lose body weight.  Lifestyle modification discussed. Body mass index is 52.46 kg/m.             Discharge Instructions  Allergies as of 02/13/2023       Reactions    Contrast Media [iodinated Contrast Media] Hives        Medication List     STOP taking these medications    famotidine 20 MG tablet Commonly known as: PEPCID   ranitidine 150 MG tablet Commonly known as: ZANTAC   Wixela Inhub 100-50 MCG/ACT Aepb Generic drug: fluticasone-salmeterol       TAKE these medications    amLODipine 10 MG tablet Commonly known as: NORVASC Take 10 mg by mouth daily. What changed: Another medication with the same name was removed. Continue taking this medication, and follow the directions you see here.   amoxicillin-clavulanate 500-125 MG tablet Commonly known as: AUGMENTIN Take 1 tablet by mouth 2 (two) times daily for 4 days.   atorvastatin 20 MG tablet Commonly known as: LIPITOR Take 20 mg by mouth daily.   azithromycin 500 MG tablet Commonly known as: ZITHROMAX Take 1 tablet (500 mg total) by mouth daily for 2 days.   baclofen 10 MG tablet Commonly known as: LIORESAL Take 10 mg by mouth 3 (three) times daily.   chlorpheniramine-HYDROcodone 10-8 MG/5ML Commonly known as: TUSSIONEX Take 5 mLs by mouth every 12 (twelve) hours as needed for cough.   diphenhydrAMINE 50 MG tablet Commonly known as: BENADRYL Take 1 tablet (50 mg total) by mouth every 6 (six) hours as needed for itching (swelling).   EPINEPHrine 0.3 mg/0.3 mL Soaj injection Commonly known as: EPI-PEN Inject 0.3 mg into the muscle as needed for anaphylaxis.   fluticasone furoate-vilanterol 100-25 MCG/ACT Aepb Commonly known as: Breo Ellipta Inhale 1 puff into the lungs daily.   furosemide 40 MG tablet Commonly known as: LASIX Take 40 mg by mouth daily. What changed: Another medication with the same name was removed. Continue taking this medication, and follow the directions you see here.   gabapentin 300 MG capsule Commonly known as: NEURONTIN Take 600 mg by mouth 2 (two) times daily.   guaiFENesin 600 MG 12 hr tablet Commonly known as: MUCINEX Take 1 tablet  (600 mg total) by mouth 2 (two) times daily as needed for cough or to loosen phlegm.   ipratropium-albuterol 0.5-2.5 (3) MG/3ML Soln Commonly known as: DUONEB Take 3 mLs by nebulization every 4 (four) hours as needed (wheezing / shortness of breath).   metFORMIN 1000 MG tablet Commonly known as: GLUCOPHAGE Take 1,000 mg by mouth 2 (two) times daily.   Ozempic (0.25 or 0.5 MG/DOSE) 2 MG/3ML Sopn Generic drug: Semaglutide(0.25 or 0.5MG /DOS) Inject into the skin.   predniSONE 10 MG tablet Commonly known as: DELTASONE Take 4 tablets (40 mg total) by mouth daily with breakfast for 2 days, THEN 3 tablets (30 mg total) daily with breakfast for 2 days, THEN 2 tablets (20 mg total) daily with breakfast for 2 days, THEN 1 tablet (10 mg total) daily with breakfast for 2 days, THEN 0.5 tablets (5 mg total) daily with breakfast for 2 days. Start taking on: February 14, 2023   rOPINIRole 2 MG tablet Commonly known as: REQUIP Take 2 mg by mouth in the morning and at bedtime.  Tylenol 8 Hour Arthritis Pain 650 MG CR tablet Generic drug: acetaminophen Take 1,950 mg by mouth every 8 (eight) hours as needed for pain.   Ventolin HFA 108 (90 Base) MCG/ACT inhaler Generic drug: albuterol Inhale 2 puffs into the lungs every 4 (four) hours as needed for wheezing or shortness of breath. What changed:  when to take this reasons to take this               Durable Medical Equipment  (From admission, onward)           Start     Ordered   02/13/23 0000  For home use only DME Nebulizer machine       Question Answer Comment  Patient needs a nebulizer to treat with the following condition COPD (chronic obstructive pulmonary disease) (HCC)   Length of Need Lifetime      02/13/23 1652              Allergies  Allergen Reactions   Contrast Media [Iodinated Contrast Media] Hives     Subjective: ot feeling improved today and no concerns for discharge    Discharge Exam: BP (!) 154/75  (BP Location: Right Arm)   Pulse 69   Temp 98.5 F (36.9 C)   Resp 16   Ht 5\' 4"  (1.626 m)   Wt (!) 138.6 kg   LMP 01/04/2018   SpO2 94%   BMI 52.46 kg/m  General: Pt is alert, awake, not in acute distress Cardiovascular: RRR, S1/S2 +, no rubs, no gallops Respiratory: CTA bilaterally, no wheezing, no rhonchi Abdominal: Soft, NT, ND, bowel sounds + Extremities: no edema, no cyanosis     The results of significant diagnostics from this hospitalization (including imaging, microbiology, ancillary and laboratory) are listed below for reference.     Microbiology: Recent Results (from the past 240 hour(s))  Blood culture (routine x 2)     Status: None (Preliminary result)   Collection Time: 02/10/23  3:06 PM   Specimen: BLOOD  Result Value Ref Range Status   Specimen Description BLOOD BLOOD LEFT ARM  Final   Special Requests   Final    BOTTLES DRAWN AEROBIC AND ANAEROBIC Blood Culture adequate volume   Culture   Final    NO GROWTH 4 DAYS Performed at [facility], [address], [place], [place] [address]    Report Status PENDING  Incomplete  Blood culture (routine x 2)     Status: None (Preliminary result)   Collection Time: 02/10/23  3:11 PM   Specimen: BLOOD  Result Value Ref Range Status   Specimen Description BLOOD BLOOD RIGHT ARM  Final   Special Requests   Final    BOTTLES DRAWN AEROBIC AND ANAEROBIC Blood Culture adequate volume   Culture   Final    NO GROWTH 4 DAYS Performed at [facility], [address] [place], [place] [address]    Report Status PENDING  Incomplete  SARS Coronavirus 2 by RT PCR (hospital order, performed in [facility] hospital lab) *cepheid single result test* Anterior Nasal Swab     Status: None   Collection Time: 02/10/23  5:27 PM   Specimen: Anterior Nasal Swab  Result Value Ref Range Status   SARS Coronavirus 2 by RT PCR NEGATIVE NEGATIVE Final    Comment: (NOTE) SARS-CoV-2 target nucleic acids are NOT  DETECTED.  The SARS-CoV-2 RNA is generally detectable in upper and lower respiratory specimens during the acute phase of infection. The lowest  concentration of SARS-CoV-2 viral copies this assay can detect is 250 copies / mL. A negative result does not preclude SARS-CoV-2 infection and should not be used as the sole basis for treatment or other patient management decisions.  A negative result may occur with improper specimen collection / handling, submission of specimen other than nasopharyngeal swab, presence of viral mutation(s) within the areas targeted by this assay, and inadequate number of viral copies (<250 copies / mL). A negative result must be combined with clinical observations, patient history, and epidemiological information.  Fact Sheet for Patients:   RoadLapTop.co.za  Fact Sheet for Healthcare Providers: http://kim-miller.com/  This test is not yet approved or  cleared by the [place] FDA and has been authorized for detection and/or diagnosis of SARS-CoV-2 by FDA under an Emergency Use Authorization (EUA).  This EUA will remain in effect (meaning this test can be used) for the duration of the COVID-19 declaration under Section 564(b)(1) of the Act, 21 U.S.C. section 360bbb-3(b)(1), unless the authorization is terminated or revoked sooner.  Performed at [facility], [address] [place], [place] [address]   Respiratory (~20 pathogens) panel by PCR     Status: None   Collection Time: 02/10/23  5:27 PM   Specimen: Anterior Nasal Swab; Respiratory  Result Value Ref Range Status   Adenovirus NOT DETECTED NOT DETECTED Final   Coronavirus 229E NOT DETECTED NOT DETECTED Final    Comment: (NOTE) The Coronavirus on the Respiratory Panel, DOES NOT test for the novel  Coronavirus (2019 nCoV)    Coronavirus HKU1 NOT DETECTED NOT DETECTED Final   Coronavirus NL63 NOT DETECTED NOT DETECTED Final   Coronavirus  OC43 NOT DETECTED NOT DETECTED Final   Metapneumovirus NOT DETECTED NOT DETECTED Final   Rhinovirus / Enterovirus NOT DETECTED NOT DETECTED Final   Influenza A NOT DETECTED NOT DETECTED Final   Influenza B NOT DETECTED NOT DETECTED Final   Parainfluenza Virus 1 NOT DETECTED NOT DETECTED Final   Parainfluenza Virus 2 NOT DETECTED NOT DETECTED Final   Parainfluenza Virus 3 NOT DETECTED NOT DETECTED Final   Parainfluenza Virus 4 NOT DETECTED NOT DETECTED Final   Respiratory Syncytial Virus NOT DETECTED NOT DETECTED Final   Bordetella pertussis NOT DETECTED NOT DETECTED Final   Bordetella Parapertussis NOT DETECTED NOT DETECTED Final   Chlamydophila pneumoniae NOT DETECTED NOT DETECTED Final   Mycoplasma pneumoniae NOT DETECTED NOT DETECTED Final    Comment: Performed at [facility], [address], [place], [place] [address]     Labs: BNP (last 3 results) Recent Labs    02/10/23 1407  BNP 13.6   Basic Metabolic Panel: Recent Labs  Lab 02/10/23 1407 02/10/23 1727 02/11/23 0425 02/12/23 0346 02/13/23 0446  NA 138  --  138 137 137  K 3.1*  --  4.2 3.9 4.4  CL 102  --  107 102 100  CO2 26  --  22 25 27   GLUCOSE 166*  --  271* 268* 355*  BUN 10  --  8 14 20   CREATININE 0.89 0.87 0.66 0.96 0.90  CALCIUM 8.8*  --  7.8* 8.2* 8.5*  MG 2.0  --  1.9 2.3 2.5*  PHOS 3.7  --  2.2* 3.3 3.2   Liver Function Tests: Recent Labs  Lab 02/10/23 1407  AST 14*  ALT 13  ALKPHOS 74  BILITOT 0.7  PROT 8.1  ALBUMIN 4.3   No results for input(s): "LIPASE", "AMYLASE" in the last 168 hours.  No results for input(s): "AMMONIA" in the last 168 hours. CBC: Recent Labs  Lab 02/10/23 1407 02/10/23 1727 02/11/23 0425 02/12/23 0346 02/13/23 0446  WBC 7.2 8.3 7.3 9.6 8.0  HGB 13.1 12.1 13.0 12.0 12.3  HCT 41.5 38.3 40.9 36.9 38.7  MCV 88.5 88.7 88.9 87.6 88.4  PLT 379 355 284 400 392   Cardiac Enzymes: No results for input(s): "CKTOTAL", "CKMB", "CKMBINDEX", "TROPONINI" in the  last 168 hours. BNP: Invalid input(s): "POCBNP" CBG: Recent Labs  Lab 02/12/23 1557 02/12/23 2044 02/13/23 0752 02/13/23 1130 02/13/23 1554  GLUCAP 269* 251* 289* 159* 301*   D-Dimer No results for input(s): "DDIMER" in the last 72 hours. Hgb A1c No results for input(s): "HGBA1C" in the last 72 hours. Lipid Profile No results for input(s): "CHOL", "HDL", "LDLCALC", "TRIG", "CHOLHDL", "LDLDIRECT" in the last 72 hours. Thyroid function studies No results for input(s): "TSH", "T4TOTAL", "T3FREE", "THYROIDAB" in the last 72 hours.  Invalid input(s): "FREET3" Anemia work up No results for input(s): "VITAMINB12", "FOLATE", "FERRITIN", "TIBC", "IRON", "RETICCTPCT" in the last 72 hours. Urinalysis    Component Value Date/Time   COLORURINE YELLOW (A) 10/16/2022 2321   APPEARANCEUR CLEAR (A) 10/16/2022 2321   APPEARANCEUR Cloudy 08/11/2013 0124   LABSPEC 1.018 10/16/2022 2321   LABSPEC 1.021 08/11/2013 0124   PHURINE 5.0 10/16/2022 2321   GLUCOSEU 50 (A) 10/16/2022 2321   GLUCOSEU Negative 08/11/2013 0124   HGBUR NEGATIVE 10/16/2022 2321   BILIRUBINUR NEGATIVE 10/16/2022 2321   BILIRUBINUR Negative 08/11/2013 0124   KETONESUR 20 (A) 10/16/2022 2321   PROTEINUR 30 (A) 10/16/2022 2321   NITRITE NEGATIVE 10/16/2022 2321   LEUKOCYTESUR NEGATIVE 10/16/2022 2321   LEUKOCYTESUR Negative 08/11/2013 0124   Sepsis Labs Recent Labs  Lab 02/10/23 1727 02/11/23 0425 02/12/23 0346 02/13/23 0446  WBC 8.3 7.3 9.6 8.0   Microbiology Recent Results (from the past 240 hour(s))  Blood culture (routine x 2)     Status: None (Preliminary result)   Collection Time: 02/10/23  3:06 PM   Specimen: BLOOD  Result Value Ref Range Status   Specimen Description BLOOD BLOOD LEFT ARM  Final   Special Requests   Final    BOTTLES DRAWN AEROBIC AND ANAEROBIC Blood Culture adequate volume   Culture   Final    NO GROWTH 4 DAYS Performed at [facility], [address] [place], [place]  [address]    Report Status PENDING  Incomplete  Blood culture (routine x 2)     Status: None (Preliminary result)   Collection Time: 02/10/23  3:11 PM   Specimen: BLOOD  Result Value Ref Range Status   Specimen Description BLOOD BLOOD RIGHT ARM  Final   Special Requests   Final    BOTTLES DRAWN AEROBIC AND ANAEROBIC Blood Culture adequate volume   Culture   Final    NO GROWTH 4 DAYS Performed at [facility], [address] [place], [place] [address]    Report Status PENDING  Incomplete  SARS Coronavirus 2 by RT PCR (hospital order, performed in [facility] hospital lab) *cepheid single result test* Anterior Nasal Swab     Status: None   Collection Time: 02/10/23  5:27 PM   Specimen: Anterior Nasal Swab  Result Value Ref Range Status   SARS Coronavirus 2 by RT PCR NEGATIVE NEGATIVE Final    Comment: (NOTE) SARS-CoV-2 target nucleic acids are NOT DETECTED.  The SARS-CoV-2 RNA is generally detectable in upper and lower respiratory specimens during the acute  phase of infection. The lowest concentration of SARS-CoV-2 viral copies this assay can detect is 250 copies / mL. A negative result does not preclude SARS-CoV-2 infection and should not be used as the sole basis for treatment or other patient management decisions.  A negative result may occur with improper specimen collection / handling, submission of specimen other than nasopharyngeal swab, presence of viral mutation(s) within the areas targeted by this assay, and inadequate number of viral copies (<250 copies / mL). A negative result must be combined with clinical observations, patient history, and epidemiological information.  Fact Sheet for Patients:   RoadLapTop.co.za  Fact Sheet for Healthcare Providers: http://kim-miller.com/  This test is not yet approved or  cleared by the [place] FDA and has been authorized for detection and/or diagnosis of SARS-CoV-2  by FDA under an Emergency Use Authorization (EUA).  This EUA will remain in effect (meaning this test can be used) for the duration of the COVID-19 declaration under Section 564(b)(1) of the Act, 21 U.S.C. section 360bbb-3(b)(1), unless the authorization is terminated or revoked sooner.  Performed at [facility], [address] [place], [place] [address]   Respiratory (~20 pathogens) panel by PCR     Status: None   Collection Time: 02/10/23  5:27 PM   Specimen: Anterior Nasal Swab; Respiratory  Result Value Ref Range Status   Adenovirus NOT DETECTED NOT DETECTED Final   Coronavirus 229E NOT DETECTED NOT DETECTED Final    Comment: (NOTE) The Coronavirus on the Respiratory Panel, DOES NOT test for the novel  Coronavirus (2019 nCoV)    Coronavirus HKU1 NOT DETECTED NOT DETECTED Final   Coronavirus NL63 NOT DETECTED NOT DETECTED Final   Coronavirus OC43 NOT DETECTED NOT DETECTED Final   Metapneumovirus NOT DETECTED NOT DETECTED Final   Rhinovirus / Enterovirus NOT DETECTED NOT DETECTED Final   Influenza A NOT DETECTED NOT DETECTED Final   Influenza B NOT DETECTED NOT DETECTED Final   Parainfluenza Virus 1 NOT DETECTED NOT DETECTED Final   Parainfluenza Virus 2 NOT DETECTED NOT DETECTED Final   Parainfluenza Virus 3 NOT DETECTED NOT DETECTED Final   Parainfluenza Virus 4 NOT DETECTED NOT DETECTED Final   Respiratory Syncytial Virus NOT DETECTED NOT DETECTED Final   Bordetella pertussis NOT DETECTED NOT DETECTED Final   Bordetella Parapertussis NOT DETECTED NOT DETECTED Final   Chlamydophila pneumoniae NOT DETECTED NOT DETECTED Final   Mycoplasma pneumoniae NOT DETECTED NOT DETECTED Final    Comment: Performed at [facility], [address] [place], [place] [address]   Imaging ECHOCARDIOGRAM COMPLETE  Result Date: 02/12/2023    ECHOCARDIOGRAM REPORT   Patient Name:   Kilah Baune Date of Exam: 02/12/2023 Medical Rec #:  [id number]          Height:       64.0  in Accession #:    [id number]         Weight:       305.6 lb Date of Birth:  Jun 03, 1961          BSA:          2.343 m Patient Age:    62 years           BP:           145/71 mmHg Patient Gender: F                  HR:           78 bpm. Exam  Location:  [facility] Procedure: 2D Echo, Cardiac Doppler, Color Doppler and Intracardiac            Opacification Agent Indications:     CHF  History:         Patient has no prior history of Echocardiogram examinations.                  CHF, Abnormal ECG, Signs/Symptoms:Chest Pain and Shortness of                  Breath; Risk Factors:Hypertension and Diabetes.  Sonographer:     Mikki Harbor Referring Phys:  [id number] Gillis Santa Diagnosing Phys: Lorine Bears MD  Sonographer Comments: Technically difficult study due to poor echo windows, suboptimal apical window and patient is obese. IMPRESSIONS  1. Left ventricular ejection fraction, by estimation, is 60 to 65%. The left ventricle has normal function. The left ventricle has no regional wall motion abnormalities. The left ventricular internal cavity size was mildly dilated. There is mild left ventricular hypertrophy. Left ventricular diastolic parameters were normal.  2. Right ventricular systolic function is normal. The right ventricular size is normal. Tricuspid regurgitation signal is inadequate for assessing PA pressure.  3. The mitral valve is normal in structure. No evidence of mitral valve regurgitation. No evidence of mitral stenosis.  4. The aortic valve is normal in structure. Aortic valve regurgitation is not visualized. No aortic stenosis is present.  5. The inferior vena cava is dilated in size with >50% respiratory variability, suggesting right atrial pressure of 8 mmHg. FINDINGS  Left Ventricle: Left ventricular ejection fraction, by estimation, is 60 to 65%. The left ventricle has normal function. The left ventricle has no regional wall motion abnormalities. Definity contrast agent was given IV to delineate  the left ventricular  endocardial borders. The left ventricular internal cavity size was mildly dilated. There is mild left ventricular hypertrophy. Left ventricular diastolic parameters were normal. Right Ventricle: The right ventricular size is normal. No increase in right ventricular wall thickness. Right ventricular systolic function is normal. Tricuspid regurgitation signal is inadequate for assessing PA pressure. Left Atrium: Left atrial size was normal in size. Right Atrium: Right atrial size was normal in size. Pericardium: There is no evidence of pericardial effusion. Mitral Valve: The mitral valve is normal in structure. No evidence of mitral valve regurgitation. No evidence of mitral valve stenosis. MV peak gradient, 5.8 mmHg. The mean mitral valve gradient is 3.0 mmHg. Tricuspid Valve: The tricuspid valve is normal in structure. Tricuspid valve regurgitation is not demonstrated. No evidence of tricuspid stenosis. Aortic Valve: The aortic valve is normal in structure. Aortic valve regurgitation is not visualized. No aortic stenosis is present. Aortic valve mean gradient measures 5.0 mmHg. Aortic valve peak gradient measures 11.2 mmHg. Aortic valve area, by VTI measures 2.81 cm. Pulmonic Valve: The pulmonic valve was normal in structure. Pulmonic valve regurgitation is not visualized. No evidence of pulmonic stenosis. Aorta: The aortic root is normal in size and structure. Venous: The inferior vena cava is dilated in size with greater than 50% respiratory variability, suggesting right atrial pressure of 8 mmHg. IAS/Shunts: No atrial level shunt detected by color flow Doppler.  LEFT VENTRICLE PLAX 2D LVIDd:         5.30 cm   Diastology LVIDs:         2.90 cm   LV e' medial:    7.83 cm/s LV PW:         1.50 cm  LV E/e' medial:  13.2 LV IVS:        1.30 cm   LV e' lateral:   9.57 cm/s LVOT diam:     2.00 cm   LV E/e' lateral: 10.8 LV SV:         97 LV SV Index:   42 LVOT Area:     3.14 cm  LEFT ATRIUM            Index LA diam:      4.20 cm 1.79 cm/m LA Vol (A4C): 45.3 ml 19.34 ml/m  AORTIC VALVE AV Area (Vmax):    2.88 cm AV Area (Vmean):   2.63 cm AV Area (VTI):     2.81 cm AV Vmax:           167.00 cm/s AV Vmean:          102.000 cm/s AV VTI:            0.347 m AV Peak Grad:      11.2 mmHg AV Mean Grad:      5.0 mmHg LVOT Vmax:         153.00 cm/s LVOT Vmean:        85.300 cm/s LVOT VTI:          0.310 m LVOT/AV VTI ratio: 0.89  AORTA Ao Root diam: 3.50 cm MITRAL VALVE MV Area (PHT): 5.06 cm     SHUNTS MV Area VTI:   3.02 cm     Systemic VTI:  0.31 m MV Peak grad:  5.8 mmHg     Systemic Diam: 2.00 cm MV Mean grad:  3.0 mmHg MV Vmax:       1.20 m/s MV Vmean:      77.0 cm/s MV Decel Time: 150 msec MV E velocity: 103.00 cm/s MV A velocity: 112.00 cm/s MV E/A ratio:  0.92 Lorine Bears MD Electronically signed by Lorine Bears MD Signature Date/Time: 02/12/2023/4:48:12 PM    Final       Time coordinating discharge: over 30 minutes  SIGNED:  Sunnie Nielsen DO [facility]

## 2023-02-15 LAB — CULTURE, BLOOD (ROUTINE X 2)
Culture: NO GROWTH
Culture: NO GROWTH
Special Requests: ADEQUATE
Special Requests: ADEQUATE

## 2023-04-18 ENCOUNTER — Other Ambulatory Visit: Payer: Self-pay

## 2023-04-18 ENCOUNTER — Inpatient Hospital Stay
Admission: EM | Admit: 2023-04-18 | Discharge: 2023-04-21 | DRG: 190 | Disposition: A | Discharge status: Home / Self Care | Payer: 59 | Attending: Hospitalist | Admitting: Hospitalist

## 2023-04-18 ENCOUNTER — Emergency Department: Payer: 59

## 2023-04-18 DIAGNOSIS — Z90710 Acquired absence of both cervix and uterus: Secondary | ICD-10-CM | POA: Diagnosis not present

## 2023-04-18 DIAGNOSIS — Z8616 Personal history of COVID-19: Secondary | ICD-10-CM | POA: Diagnosis not present

## 2023-04-18 DIAGNOSIS — J209 Acute bronchitis, unspecified: Secondary | ICD-10-CM

## 2023-04-18 DIAGNOSIS — Z8249 Family history of ischemic heart disease and other diseases of the circulatory system: Secondary | ICD-10-CM | POA: Diagnosis not present

## 2023-04-18 DIAGNOSIS — I10 Essential (primary) hypertension: Secondary | ICD-10-CM | POA: Diagnosis present

## 2023-04-18 DIAGNOSIS — M1990 Unspecified osteoarthritis, unspecified site: Secondary | ICD-10-CM | POA: Diagnosis present

## 2023-04-18 DIAGNOSIS — G2581 Restless legs syndrome: Secondary | ICD-10-CM | POA: Diagnosis present

## 2023-04-18 DIAGNOSIS — Z1152 Encounter for screening for COVID-19: Secondary | ICD-10-CM

## 2023-04-18 DIAGNOSIS — J440 Chronic obstructive pulmonary disease with acute lower respiratory infection: Secondary | ICD-10-CM | POA: Diagnosis present

## 2023-04-18 DIAGNOSIS — Z9842 Cataract extraction status, left eye: Secondary | ICD-10-CM | POA: Diagnosis not present

## 2023-04-18 DIAGNOSIS — K219 Gastro-esophageal reflux disease without esophagitis: Secondary | ICD-10-CM | POA: Diagnosis present

## 2023-04-18 DIAGNOSIS — Z7984 Long term (current) use of oral hypoglycemic drugs: Secondary | ICD-10-CM | POA: Diagnosis not present

## 2023-04-18 DIAGNOSIS — R0902 Hypoxemia: Secondary | ICD-10-CM | POA: Diagnosis present

## 2023-04-18 DIAGNOSIS — Z6841 Body Mass Index (BMI) 40.0 and over, adult: Secondary | ICD-10-CM | POA: Diagnosis not present

## 2023-04-18 DIAGNOSIS — J45901 Unspecified asthma with (acute) exacerbation: Secondary | ICD-10-CM | POA: Diagnosis present

## 2023-04-18 DIAGNOSIS — Z803 Family history of malignant neoplasm of breast: Secondary | ICD-10-CM

## 2023-04-18 DIAGNOSIS — T380X5A Adverse effect of glucocorticoids and synthetic analogues, initial encounter: Secondary | ICD-10-CM | POA: Diagnosis present

## 2023-04-18 DIAGNOSIS — Z833 Family history of diabetes mellitus: Secondary | ICD-10-CM | POA: Diagnosis not present

## 2023-04-18 DIAGNOSIS — J9601 Acute respiratory failure with hypoxia: Secondary | ICD-10-CM | POA: Diagnosis present

## 2023-04-18 DIAGNOSIS — Z91041 Radiographic dye allergy status: Secondary | ICD-10-CM

## 2023-04-18 DIAGNOSIS — Z8041 Family history of malignant neoplasm of ovary: Secondary | ICD-10-CM

## 2023-04-18 DIAGNOSIS — E1165 Type 2 diabetes mellitus with hyperglycemia: Secondary | ICD-10-CM | POA: Diagnosis present

## 2023-04-18 DIAGNOSIS — E66813 Obesity, class 3: Secondary | ICD-10-CM | POA: Diagnosis present

## 2023-04-18 DIAGNOSIS — J441 Chronic obstructive pulmonary disease with (acute) exacerbation: Secondary | ICD-10-CM | POA: Diagnosis present

## 2023-04-18 DIAGNOSIS — B349 Viral infection, unspecified: Secondary | ICD-10-CM | POA: Diagnosis present

## 2023-04-18 DIAGNOSIS — A419 Sepsis, unspecified organism: Secondary | ICD-10-CM

## 2023-04-18 DIAGNOSIS — J189 Pneumonia, unspecified organism: Secondary | ICD-10-CM

## 2023-04-18 DIAGNOSIS — Z79899 Other long term (current) drug therapy: Secondary | ICD-10-CM | POA: Diagnosis not present

## 2023-04-18 DIAGNOSIS — Z961 Presence of intraocular lens: Secondary | ICD-10-CM | POA: Diagnosis present

## 2023-04-18 LAB — GLUCOSE, CAPILLARY
Glucose-Capillary: 353 mg/dL — ABNORMAL HIGH (ref 70–99)
Glucose-Capillary: 340 mg/dL — ABNORMAL HIGH (ref 70–99)
Glucose-Capillary: 315 mg/dL — ABNORMAL HIGH (ref 70–99)

## 2023-04-18 LAB — BLOOD CULTURE ID PANEL (REFLEXED) - BCID2

## 2023-04-18 LAB — CBC WITH DIFFERENTIAL/PLATELET
Abs Immature Granulocytes: 0.05 10*3/uL (ref 0.00–0.07)
Basophils Absolute: 0.1 10*3/uL (ref 0.0–0.1)
Basophils Relative: 1 %
Eosinophils Absolute: 1 10*3/uL — ABNORMAL HIGH (ref 0.0–0.5)
Eosinophils Relative: 10 %
HCT: 40.9 % (ref 36.0–46.0)
Hemoglobin: 12.9 g/dL (ref 12.0–15.0)
Immature Granulocytes: 1 %
Lymphocytes Relative: 25 %
Lymphs Abs: 2.3 10*3/uL (ref 0.7–4.0)
MCH: 28 pg (ref 26.0–34.0)
MCHC: 31.5 g/dL (ref 30.0–36.0)
MCV: 88.7 fL (ref 80.0–100.0)
Monocytes Absolute: 0.6 10*3/uL (ref 0.1–1.0)
Monocytes Relative: 7 %
Neutro Abs: 5.3 10*3/uL (ref 1.7–7.7)
Neutrophils Relative %: 56 %
Platelets: 385 10*3/uL (ref 150–400)
RBC: 4.61 MIL/uL (ref 3.87–5.11)
RDW: 14.3 % (ref 11.5–15.5)
WBC: 9.3 10*3/uL (ref 4.0–10.5)
nRBC: 0 % (ref 0.0–0.2)

## 2023-04-18 LAB — COMPREHENSIVE METABOLIC PANEL
ALT: 18 U/L (ref 0–44)
AST: 12 U/L — ABNORMAL LOW (ref 15–41)
Albumin: 3.8 g/dL (ref 3.5–5.0)
Alkaline Phosphatase: 86 U/L (ref 38–126)
Anion gap: 10 (ref 5–15)
BUN: 11 mg/dL (ref 8–23)
CO2: 29 mmol/L (ref 22–32)
Calcium: 8.5 mg/dL — ABNORMAL LOW (ref 8.9–10.3)
Chloride: 97 mmol/L — ABNORMAL LOW (ref 98–111)
Creatinine, Ser: 1.06 mg/dL — ABNORMAL HIGH (ref 0.44–1.00)
GFR, Estimated: 59 mL/min — ABNORMAL LOW (ref >60)
Glucose, Bld: 351 mg/dL — ABNORMAL HIGH (ref 70–99)
Potassium: 3.2 mmol/L — ABNORMAL LOW (ref 3.5–5.1)
Sodium: 136 mmol/L (ref 135–145)
Total Bilirubin: 0.5 mg/dL (ref 0.3–1.2)
Total Protein: 7.6 g/dL (ref 6.5–8.1)

## 2023-04-18 LAB — CBG MONITORING, ED: Glucose-Capillary: 417 mg/dL — ABNORMAL HIGH (ref 70–99)

## 2023-04-18 LAB — TROPONIN I (HIGH SENSITIVITY)
Troponin I (High Sensitivity): 13 ng/L (ref <18)
Troponin I (High Sensitivity): 18 ng/L — ABNORMAL HIGH (ref <18)

## 2023-04-18 LAB — RESP PANEL BY RT-PCR (RSV, FLU A&B, COVID)  RVPGX2
Influenza A by PCR: NEGATIVE
Influenza B by PCR: NEGATIVE
Resp Syncytial Virus by PCR: NEGATIVE
SARS Coronavirus 2 by RT PCR: NEGATIVE

## 2023-04-18 LAB — LACTIC ACID, PLASMA
Lactic Acid, Venous: 2 mmol/L (ref 0.5–1.9)
Lactic Acid, Venous: 1.4 mmol/L (ref 0.5–1.9)

## 2023-04-18 LAB — PROCALCITONIN: Procalcitonin: <0.1 ng/mL

## 2023-04-18 LAB — BRAIN NATRIURETIC PEPTIDE: B Natriuretic Peptide: 23.1 pg/mL (ref 0.0–100.0)

## 2023-04-18 MED ORDER — SODIUM CHLORIDE 0.9 % IV SOLN
2.0000 g | INTRAVENOUS | Status: DC
  Administered 2023-04-19: 2 g via INTRAVENOUS
  Filled 2023-04-18: qty 20

## 2023-04-18 MED ORDER — ACETAMINOPHEN 325 MG PO TABS
650.0000 mg | ORAL_TABLET | Freq: Four times a day (QID) | ORAL | Status: DC | PRN
  Administered 2023-04-18 – 2023-04-19 (×3): 650 mg via ORAL
  Filled 2023-04-18 (×3): qty 2

## 2023-04-18 MED ORDER — ONDANSETRON HCL 4 MG PO TABS: 4.0000 mg | ORAL_TABLET | Freq: Four times a day (QID) | ORAL | Status: DC | PRN

## 2023-04-18 MED ORDER — IPRATROPIUM BROMIDE 0.02 % IN SOLN
0.5000 mg | Freq: Four times a day (QID) | RESPIRATORY_TRACT | Status: DC
  Administered 2023-04-18 (×2): 0.5 mg via RESPIRATORY_TRACT
  Filled 2023-04-18 (×2): qty 2.5

## 2023-04-18 MED ORDER — ONDANSETRON HCL 4 MG/2ML IJ SOLN: 4.0000 mg | Freq: Four times a day (QID) | INTRAMUSCULAR | Status: DC | PRN

## 2023-04-18 MED ORDER — LACTATED RINGERS IV SOLN: INTRAVENOUS | Status: DC

## 2023-04-18 MED ORDER — VANCOMYCIN HCL 1250 MG/250ML IV SOLN: 1250.0000 mg | INTRAVENOUS | Status: DC

## 2023-04-18 MED ORDER — ACETAMINOPHEN 650 MG RE SUPP: 650.0000 mg | Freq: Four times a day (QID) | RECTAL | Status: DC | PRN

## 2023-04-18 MED ORDER — HYDROCOD POLI-CHLORPHE POLI ER 10-8 MG/5ML PO SUER
5.0000 mL | Freq: Once | ORAL | Status: AC
  Administered 2023-04-18: 5 mL via ORAL
  Filled 2023-04-18: qty 5

## 2023-04-18 MED ORDER — SODIUM CHLORIDE 0.9 % IV SOLN
500.0000 mg | INTRAVENOUS | Status: DC
  Administered 2023-04-19: 500 mg via INTRAVENOUS
  Filled 2023-04-18: qty 5

## 2023-04-18 MED ORDER — ATORVASTATIN CALCIUM 20 MG PO TABS
20.0000 mg | ORAL_TABLET | Freq: Every day | ORAL | Status: DC
  Administered 2023-04-18 – 2023-04-21 (×4): 20 mg via ORAL
  Filled 2023-04-18 (×4): qty 1

## 2023-04-18 MED ORDER — IPRATROPIUM-ALBUTEROL 0.5-2.5 (3) MG/3ML IN SOLN
3.0000 mL | Freq: Once | RESPIRATORY_TRACT | Status: AC
  Administered 2023-04-18: 3 mL via RESPIRATORY_TRACT
  Filled 2023-04-18: qty 3

## 2023-04-18 MED ORDER — DIPHENHYDRAMINE HCL 25 MG PO CAPS
50.0000 mg | ORAL_CAPSULE | Freq: Four times a day (QID) | ORAL | Status: DC | PRN
  Administered 2023-04-18 – 2023-04-19 (×2): 50 mg via ORAL
  Filled 2023-04-18 (×2): qty 2

## 2023-04-18 MED ORDER — SODIUM CHLORIDE 0.9 % IV SOLN
1.0000 g | Freq: Once | INTRAVENOUS | Status: AC
  Administered 2023-04-18: 1 g via INTRAVENOUS
  Filled 2023-04-18: qty 10

## 2023-04-18 MED ORDER — ROPINIROLE HCL 1 MG PO TABS
2.0000 mg | ORAL_TABLET | Freq: Two times a day (BID) | ORAL | Status: DC
  Administered 2023-04-18: 2 mg via ORAL
  Filled 2023-04-18: qty 2

## 2023-04-18 MED ORDER — METHYLPREDNISOLONE SODIUM SUCC 125 MG IJ SOLR
125.0000 mg | Freq: Once | INTRAMUSCULAR | Status: AC
  Administered 2023-04-18: 125 mg via INTRAVENOUS
  Filled 2023-04-18: qty 2

## 2023-04-18 MED ORDER — IPRATROPIUM-ALBUTEROL 0.5-2.5 (3) MG/3ML IN SOLN
3.0000 mL | Freq: Two times a day (BID) | RESPIRATORY_TRACT | Status: DC
  Administered 2023-04-18 – 2023-04-21 (×5): 3 mL via RESPIRATORY_TRACT
  Filled 2023-04-18 (×6): qty 3

## 2023-04-18 MED ORDER — BACLOFEN 10 MG PO TABS
10.0000 mg | ORAL_TABLET | Freq: Three times a day (TID) | ORAL | Status: DC
  Administered 2023-04-18 – 2023-04-21 (×10): 10 mg via ORAL
  Filled 2023-04-18 (×10): qty 1

## 2023-04-18 MED ORDER — AMLODIPINE BESYLATE 10 MG PO TABS
10.0000 mg | ORAL_TABLET | Freq: Every day | ORAL | Status: DC
  Administered 2023-04-18 – 2023-04-21 (×4): 10 mg via ORAL
  Filled 2023-04-18: qty 1
  Filled 2023-04-18: qty 2
  Filled 2023-04-18 (×2): qty 1

## 2023-04-18 MED ORDER — ENOXAPARIN SODIUM 80 MG/0.8ML IJ SOSY
70.0000 mg | PREFILLED_SYRINGE | INTRAMUSCULAR | Status: DC
  Administered 2023-04-18 – 2023-04-21 (×4): 70 mg via SUBCUTANEOUS
  Filled 2023-04-18 (×4): qty 0.7

## 2023-04-18 MED ORDER — INSULIN ASPART 100 UNIT/ML IJ SOLN
0.0000 [IU] | Freq: Every day | INTRAMUSCULAR | Status: DC
  Administered 2023-04-18: 4 [IU] via SUBCUTANEOUS
  Administered 2023-04-19: 3 [IU] via SUBCUTANEOUS
  Filled 2023-04-18 (×2): qty 1

## 2023-04-18 MED ORDER — PREDNISONE 20 MG PO TABS
40.0000 mg | ORAL_TABLET | Freq: Every day | ORAL | Status: DC
  Administered 2023-04-19 – 2023-04-21 (×3): 40 mg via ORAL
  Filled 2023-04-18 (×3): qty 2

## 2023-04-18 MED ORDER — POTASSIUM CHLORIDE CRYS ER 20 MEQ PO TBCR
40.0000 meq | EXTENDED_RELEASE_TABLET | Freq: Once | ORAL | Status: AC
  Administered 2023-04-18: 40 meq via ORAL
  Filled 2023-04-18: qty 2

## 2023-04-18 MED ORDER — METHYLPREDNISOLONE SODIUM SUCC 40 MG IJ SOLR
40.0000 mg | Freq: Two times a day (BID) | INTRAMUSCULAR | Status: DC
  Administered 2023-04-18: 40 mg via INTRAVENOUS
  Filled 2023-04-18: qty 1

## 2023-04-18 MED ORDER — ALBUTEROL SULFATE (2.5 MG/3ML) 0.083% IN NEBU
2.5000 mg | INHALATION_SOLUTION | RESPIRATORY_TRACT | Status: DC | PRN
  Administered 2023-04-18 – 2023-04-20 (×3): 2.5 mg via RESPIRATORY_TRACT
  Filled 2023-04-18 (×3): qty 3

## 2023-04-18 MED ORDER — HYDROCODONE-ACETAMINOPHEN 5-325 MG PO TABS: 1.0000 | ORAL_TABLET | ORAL | Status: DC | PRN

## 2023-04-18 MED ORDER — HYDROCOD POLI-CHLORPHE POLI ER 10-8 MG/5ML PO SUER
5.0000 mL | Freq: Two times a day (BID) | ORAL | Status: DC | PRN
  Administered 2023-04-18 – 2023-04-20 (×5): 5 mL via ORAL
  Filled 2023-04-18 (×5): qty 5

## 2023-04-18 MED ORDER — ROPINIROLE HCL 1 MG PO TABS
1.0000 mg | ORAL_TABLET | Freq: Two times a day (BID) | ORAL | Status: DC
  Administered 2023-04-18 – 2023-04-21 (×6): 1 mg via ORAL
  Filled 2023-04-18 (×6): qty 1

## 2023-04-18 MED ORDER — GUAIFENESIN ER 600 MG PO TB12
600.0000 mg | ORAL_TABLET | Freq: Two times a day (BID) | ORAL | Status: DC | PRN
  Administered 2023-04-18 – 2023-04-19 (×3): 600 mg via ORAL
  Filled 2023-04-18 (×3): qty 1

## 2023-04-18 MED ORDER — PREDNISONE 20 MG PO TABS: 40.0000 mg | ORAL_TABLET | Freq: Every day | ORAL | Status: DC

## 2023-04-18 MED ORDER — SODIUM CHLORIDE 0.9 % IV SOLN
500.0000 mg | Freq: Once | INTRAVENOUS | Status: AC
  Administered 2023-04-18: 500 mg via INTRAVENOUS
  Filled 2023-04-18: qty 5

## 2023-04-18 MED ORDER — GABAPENTIN 300 MG PO CAPS
600.0000 mg | ORAL_CAPSULE | Freq: Two times a day (BID) | ORAL | Status: DC
  Administered 2023-04-18 – 2023-04-21 (×7): 600 mg via ORAL
  Filled 2023-04-18 (×7): qty 2

## 2023-04-18 MED ORDER — INSULIN ASPART 100 UNIT/ML IJ SOLN
0.0000 [IU] | Freq: Three times a day (TID) | INTRAMUSCULAR | Status: DC
  Administered 2023-04-18: 20 [IU] via SUBCUTANEOUS
  Administered 2023-04-18: 15 [IU] via SUBCUTANEOUS
  Administered 2023-04-18: 20 [IU] via SUBCUTANEOUS
  Administered 2023-04-19 (×2): 11 [IU] via SUBCUTANEOUS
  Administered 2023-04-19: 20 [IU] via SUBCUTANEOUS
  Administered 2023-04-20: 4 [IU] via SUBCUTANEOUS
  Administered 2023-04-20: 11 [IU] via SUBCUTANEOUS
  Administered 2023-04-20: 15 [IU] via SUBCUTANEOUS
  Administered 2023-04-21: 7 [IU] via SUBCUTANEOUS
  Administered 2023-04-21: 15 [IU] via SUBCUTANEOUS
  Filled 2023-04-18 (×11): qty 1

## 2023-04-18 MED ORDER — VANCOMYCIN HCL 2000 MG/400ML IV SOLN
2000.0000 mg | Freq: Once | INTRAVENOUS | Status: AC
  Administered 2023-04-18: 2000 mg via INTRAVENOUS
  Filled 2023-04-18: qty 400

## 2023-04-18 NOTE — H&P (Signed)
History and Physical    Patient: Kristina Cruz ZOX:[id number] DOB: Jun 08, 1961 DOA: 04/18/2023 DOS: the patient was seen and examined on 04/18/2023 PCP: System, Provider Not In  Patient coming from: Home  Chief Complaint:  Chief Complaint  Patient presents with   Shortness of Breath    HPI: Kristina Cruz is a 62 y.o. female with medical history significant for NIDDM T2, HTN, RLS, asthma, morbid obesity hospitalized a couple months ago with respiratory failure requiring up to 4 L, weaned off O2 prior to discharge who presents to the ED with a 1 week history of cough and shortness of breath not improving with home albuterol treatments.  EMS recording O2 sats of 85% on room air.  She denies chest pain, fever or chills. ED course and data review: Tmax 99.5 with pulse 115 and respirations 30, BP 160/93, O2 sat 91% on room air. Labs: COVID-negative CBC WNL, lactic acid pending Troponin 13 and BNP 23.1 CMP notable for glucose 351, potassium 3.2 and creatinine 1.06 EKG Personally viewed and interpreted showing sinus tachycardia at 106 with RBBB Chest x-ray showing mild interstitial edema though atypical infection could appear similarly Patient initially treated with DuoNebs, methylprednisolone and Tussionex. Subsequently started on Rocephin and azithromycin Hospitalist consulted for admission.   Review of Systems: As mentioned in the history of present illness. All other systems reviewed and are negative.  Past Medical History:  Diagnosis Date   Anemia    Asthma    Back pain    Cataract    Diabetes mellitus without complication (HCC)    GERD (gastroesophageal reflux disease)    Hypertension    Morbid obesity with BMI of 60.0-69.9, adult (HCC)    Restless leg syndrome    Past Surgical History:  Procedure Laterality Date   ABDOMINAL HYSTERECTOMY     CATARACT EXTRACTION W/PHACO Left 04/13/2020   Procedure: CATARACT EXTRACTION PHACO AND INTRAOCULAR LENS PLACEMENT (IOC);   Surgeon: Elliot Cousin, MD;  Location: [facility] ORS;  Service: Ophthalmology;  Laterality: Left;  [place] 00:36.0 CDE 5.95 Fluid Pack lot # [id number]   HYSTEROSCOPY WITH D & C N/A 07/14/2017   Procedure: DILATATION AND CURETTAGE /HYSTEROSCOPY;  Surgeon: Nadara Mustard, MD;  Location: [facility] ORS;  Service: Gynecology;  Laterality: N/A;   POLYPECTOMY  2015   Social History:  reports that she has never smoked. She has never used smokeless tobacco. She reports that she does not drink alcohol and does not use drugs.  Allergies  Allergen Reactions   Contrast Media [Iodinated Contrast Media] Hives    Family History  Problem Relation Age of Onset   Breast cancer Mother 27   Diabetes Mother    Hypertension Mother    Ovarian cancer Paternal Aunt        ?   Diabetes Father    Hypertension Father     Prior to Admission medications   Medication Sig Start Date End Date Taking? Authorizing Provider  acetaminophen (TYLENOL 8 HOUR ARTHRITIS PAIN) 650 MG CR tablet Take 1,950 mg by mouth every 8 (eight) hours as needed for pain.    [provider]  amLODipine (NORVASC) 10 MG tablet Take 10 mg by mouth daily. 11/24/22   [provider]  atorvastatin (LIPITOR) 20 MG tablet Take 20 mg by mouth daily.    [provider]  baclofen (LIORESAL) 10 MG tablet Take 10 mg by mouth 3 (three) times daily.    [provider]  chlorpheniramine-HYDROcodone (TUSSIONEX) 10-8 MG/5ML Take 5 mLs by mouth  every 12 (twelve) hours as needed for cough. 02/13/23   Sunnie Nielsen, DO  diphenhydrAMINE (BENADRYL) 50 MG tablet Take 1 tablet (50 mg total) by mouth every 6 (six) hours as needed for itching (swelling). 09/19/20   Shaune Pollack, MD  EPINEPHrine 0.3 mg/0.3 mL IJ SOAJ injection Inject 0.3 mg into the muscle as needed for anaphylaxis. 09/19/20   Shaune Pollack, MD  fluticasone furoate-vilanterol (BREO ELLIPTA) 100-25 MCG/ACT AEPB Inhale 1 puff into the lungs daily. 02/13/23   Sunnie Nielsen,  DO  furosemide (LASIX) 40 MG tablet Take 40 mg by mouth daily. 03/23/20   [provider]  gabapentin (NEURONTIN) 300 MG capsule Take 600 mg by mouth 2 (two) times daily.    [provider]  guaiFENesin (MUCINEX) 600 MG 12 hr tablet Take 1 tablet (600 mg total) by mouth 2 (two) times daily as needed for cough or to loosen phlegm. 02/13/23   Sunnie Nielsen, DO  ipratropium-albuterol (DUONEB) 0.5-2.5 (3) MG/3ML SOLN Take 3 mLs by nebulization every 4 (four) hours as needed (wheezing / shortness of breath). 02/13/23   Sunnie Nielsen, DO  metFORMIN (GLUCOPHAGE) 1000 MG tablet Take 1,000 mg by mouth 2 (two) times daily. 11/27/22   [provider]  OZEMPIC, 0.25 OR 0.5 MG/DOSE, 2 MG/3ML SOPN Inject into the skin.    [provider]  rOPINIRole (REQUIP) 2 MG tablet Take 2 mg by mouth in the morning and at bedtime.     [provider]  VENTOLIN HFA 108 (90 Base) MCG/ACT inhaler Inhale 2 puffs into the lungs every 4 (four) hours as needed for wheezing or shortness of breath. 02/13/23   Sunnie Nielsen, DO    Physical Exam: Vitals:   04/18/23 0200 04/18/23 0230 04/18/23 0300 04/18/23 0330  BP: (!) 160/93 (!) 146/71 (!) 148/73 (!) 143/75  Pulse: 84 92 73 92  Resp: 16 (!) 22 17   Temp:      TempSrc:      SpO2: 94% 93% 97% 97%  Weight:      Height:       Physical Exam Vitals and nursing note reviewed.  Constitutional:      General: She is not in acute distress.    Appearance: She is obese. She is ill-appearing.     Comments: Ill-appearing, conversational dyspnea  HENT:     Head: Normocephalic and atraumatic.  Cardiovascular:     Rate and Rhythm: Normal rate and regular rhythm.     Heart sounds: Normal heart sounds.  Pulmonary:     Effort: Tachypnea present.     Breath sounds: Wheezing and rhonchi present.  Abdominal:     Palpations: Abdomen is soft.     Tenderness: There is no abdominal tenderness.  Musculoskeletal:     Right lower leg:  Edema present.     Left lower leg: Edema present.  Neurological:     Mental Status: Mental status is at baseline.     Labs on Admission: I have personally reviewed following labs and imaging studies  CBC: Recent Labs  Lab 04/18/23 0045  WBC 9.3  NEUTROABS 5.3  HGB 12.9  HCT 40.9  MCV 88.7  PLT 385   Basic Metabolic Panel: Recent Labs  Lab 04/18/23 0045  NA 136  K 3.2*  CL 97*  CO2 29  GLUCOSE 351*  BUN 11  CREATININE 1.06*  CALCIUM 8.5*   GFR: Estimated Creatinine Clearance: 71.2 mL/min (A) (by C-G formula based on SCr of 1.06 mg/dL (H)). Liver  Function Tests: Recent Labs  Lab 04/18/23 0045  AST 12*  ALT 18  ALKPHOS 86  BILITOT 0.5  PROT 7.6  ALBUMIN 3.8   No results for input(s): "LIPASE", "AMYLASE" in the last 168 hours. No results for input(s): "AMMONIA" in the last 168 hours. Coagulation Profile: No results for input(s): "INR", "PROTIME" in the last 168 hours. Cardiac Enzymes: No results for input(s): "CKTOTAL", "CKMB", "CKMBINDEX", "TROPONINI" in the last 168 hours. BNP (last 3 results) No results for input(s): "PROBNP" in the last 8760 hours. HbA1C: No results for input(s): "HGBA1C" in the last 72 hours. CBG: No results for input(s): "GLUCAP" in the last 168 hours. Lipid Profile: No results for input(s): "CHOL", "HDL", "LDLCALC", "TRIG", "CHOLHDL", "LDLDIRECT" in the last 72 hours. Thyroid Function Tests: No results for input(s): "TSH", "T4TOTAL", "FREET4", "T3FREE", "THYROIDAB" in the last 72 hours. Anemia Panel: No results for input(s): "VITAMINB12", "FOLATE", "FERRITIN", "TIBC", "IRON", "RETICCTPCT" in the last 72 hours. Urine analysis:    Component Value Date/Time   COLORURINE YELLOW (A) 10/16/2022 2321   APPEARANCEUR CLEAR (A) 10/16/2022 2321   APPEARANCEUR Cloudy 08/11/2013 0124   LABSPEC 1.018 10/16/2022 2321   LABSPEC 1.021 08/11/2013 0124   PHURINE 5.0 10/16/2022 2321   GLUCOSEU 50 (A) 10/16/2022 2321   GLUCOSEU Negative  08/11/2013 0124   HGBUR NEGATIVE 10/16/2022 2321   BILIRUBINUR NEGATIVE 10/16/2022 2321   BILIRUBINUR Negative 08/11/2013 0124   KETONESUR 20 (A) 10/16/2022 2321   PROTEINUR 30 (A) 10/16/2022 2321   NITRITE NEGATIVE 10/16/2022 2321   LEUKOCYTESUR NEGATIVE 10/16/2022 2321   LEUKOCYTESUR Negative 08/11/2013 0124    Radiological Exams on Admission: DG Chest Port 1 View  Result Date: 04/18/2023 CLINICAL DATA:  Shortness of breath.  History of COVID 2 weeks ago. EXAM: PORTABLE CHEST 1 VIEW COMPARISON:  Radiographs 02/10/2023 FINDINGS: Stable cardiomegaly. Pulmonary vascular congestion. Interstitial coarsening in the mid and lower lungs. No focal consolidation, pleural effusion, or pneumothorax. IMPRESSION: Findings suggestive of mild interstitial edema though atypical infection could appear similarly. Electronically Signed   By: Minerva Fester M.D.   On: 04/18/2023 01:47     Data Reviewed: Relevant notes from primary care and specialist visits, past discharge summaries as available in EHR, including Care Everywhere. Prior diagnostic testing as pertinent to current admission diagnoses Updated medications and problem lists for reconciliation ED course, including vitals, labs, imaging, treatment and response to treatment Triage notes, nursing and pharmacy notes and ED provider's notes Notable results as noted in HPI   Assessment and Plan: * COPD with acute bronchitis (HCC) Acute respiratory failure with hypoxia Possible multifocal pneumonia SIRS, possible sepsis Schedule and as needed DuoNebs IV steroids Will continue Rocephin and azithromycin for possible pneumonia IV hydration Antitussives Incentive spirometer, flutter valve Supplemental oxygen to keep sats over 92%  Uncontrolled type 2 diabetes mellitus with hyperglycemia, without long-term current use of insulin (HCC) Blood sugar over 300 Sliding scale insulin coverage  Hypertension Continue amlodipine  Morbid obesity  (HCC) Complicating factor to overall prognosis and care    DVT prophylaxis: Lovenox  Consults: none  Advance Care Planning:   Code Status: Prior   Family Communication: none  Disposition Plan: Back to previous home environment  Severity of Illness: The appropriate patient status for this patient is INPATIENT. Inpatient status is judged to be reasonable and necessary in order to provide the required intensity of service to ensure the patient's safety. The patient's presenting symptoms, physical exam findings, and initial radiographic and laboratory data in the context  of their chronic comorbidities is felt to place them at high risk for further clinical deterioration. Furthermore, it is not anticipated that the patient will be medically stable for discharge from the hospital within 2 midnights of admission.   * I certify that at the point of admission it is my clinical judgment that the patient will require inpatient hospital care spanning beyond 2 midnights from the point of admission due to high intensity of service, high risk for further deterioration and high frequency of surveillance required.*  Author: Andris Baumann, MD 04/18/2023 3:53 AM  For on call review [email or web address].

## 2023-04-18 NOTE — Consult Note (Signed)
Pharmacy Antibiotic Note  Kristina Cruz is a 62 y.o. female admitted on 04/18/2023 with COPDE. PMH significant for obesity, HTN, T2DM, COPD. Pharmacy has been consulted for vancomycin dosing.  Plan: Day 1 of antibiotics Give vancomycin 2000 mg IV x1 followed by 1250 mg IV Q24H. Goal AUC 400-550. Expected AUC: 467.0 Expected Css min: 12.4 SCr used: 1.06  Weight used: IBW, Vd used: 0.72 (BMI 54.4) Patient is also on ceftriaxone 2 g IV Q24H and azithromycin 500 mg Q24H Continue to monitor renal function and follow culture results   Height: 5\' 2"  (157.5 cm) Weight: 135.2 kg (298 lb) IBW/kg (Calculated) : 50.1  Temp (24hrs), Avg:98.4 F (36.9 C), Min:97.8 F (36.6 C), Max:99.5 F (37.5 C)  Recent Labs  Lab 04/18/23 0045 04/18/23 0329 04/18/23 0549  WBC 9.3  --   --   CREATININE 1.06*  --   --   LATICACIDVEN  --  2.0* 1.4    Estimated Creatinine Clearance: 71.2 mL/min (A) (by C-G formula based on SCr of 1.06 mg/dL (H)).    Allergies  Allergen Reactions   Contrast Media [Iodinated Contrast Media] Hives    Antimicrobials this admission: 8/31 Vancomycin >>  8/31 Azithromycin >>  8/31 Ceftriaxone >>  Dose adjustments this admission: N/A  Microbiology results: 8/31 BCx: 1 of 4 bottles (anaerobic) growing GPC. BCID Staph spp. 8/31 BCx: ordered  Thank you for allowing pharmacy to be a part of this patient's care.  Celene Squibb, PharmD Clinical Pharmacist 04/18/2023 10:16 PM

## 2023-04-18 NOTE — Progress Notes (Signed)
PHARMACIST - PHYSICIAN COMMUNICATION  CONCERNING:  Enoxaparin (Lovenox) for DVT Prophylaxis    RECOMMENDATION: Patient was prescribed enoxaprin 40mg  q24 hours for VTE prophylaxis.   Filed Weights   04/18/23 0044  Weight: 135.2 kg (298 lb)    Body mass index is 54.5 kg/m.  Estimated Creatinine Clearance: 71.2 mL/min (A) (by C-G formula based on SCr of 1.06 mg/dL (H)).   Based on [facility] policy patient is candidate for enoxaparin 0.5mg /kg TBW SQ every 24 hours based on BMI being >30.  DESCRIPTION: Pharmacy has adjusted enoxaparin dose per [facility] policy.  Patient is now receiving enoxaparin 0.5 mg/kg every 24 hours   Otelia Sergeant, PharmD, [facility] 04/18/2023 4:08 AM

## 2023-04-18 NOTE — Consult Note (Signed)
PHARMACY - PHYSICIAN COMMUNICATION CRITICAL VALUE ALERT - BLOOD CULTURE IDENTIFICATION (BCID)  Kristina Cruz is an 62 y.o. female who presented to [facility] on 04/18/2023 with a chief complaint of COPDE  Assessment: 1 out of 4 bottles (anaerobic) growing GPC. BCID detects Staph spp. Resistance detection unavailable.  Name of physician (or Provider) Contacted: Lindajo Royal, MD  Current antibiotics: ceftriaxone, azithromycin  Changes to prescribed antibiotics recommended: repeat blood cultures and vancomycin per pharmacy if clinically indicated Recommendations accepted by provider  Results for orders placed or performed during the hospital encounter of 04/18/23  Blood Culture ID Panel (Reflexed) (Collected: 04/18/2023  3:30 AM)  Result Value Ref Range   Enterococcus faecalis NOT DETECTED NOT DETECTED   Enterococcus Faecium NOT DETECTED NOT DETECTED   Listeria monocytogenes NOT DETECTED NOT DETECTED   Staphylococcus species DETECTED (A) NOT DETECTED   Staphylococcus aureus (BCID) NOT DETECTED NOT DETECTED   Staphylococcus epidermidis NOT DETECTED NOT DETECTED   Staphylococcus lugdunensis NOT DETECTED NOT DETECTED   Streptococcus species NOT DETECTED NOT DETECTED   Streptococcus agalactiae NOT DETECTED NOT DETECTED   Streptococcus pneumoniae NOT DETECTED NOT DETECTED   Streptococcus pyogenes NOT DETECTED NOT DETECTED   A.calcoaceticus-baumannii NOT DETECTED NOT DETECTED   Bacteroides fragilis NOT DETECTED NOT DETECTED   Enterobacterales NOT DETECTED NOT DETECTED   Enterobacter cloacae complex NOT DETECTED NOT DETECTED   Escherichia coli NOT DETECTED NOT DETECTED   Klebsiella aerogenes NOT DETECTED NOT DETECTED   Klebsiella oxytoca NOT DETECTED NOT DETECTED   Klebsiella pneumoniae NOT DETECTED NOT DETECTED   Proteus species NOT DETECTED NOT DETECTED   Salmonella species NOT DETECTED NOT DETECTED   Serratia marcescens NOT DETECTED NOT DETECTED   Haemophilus influenzae NOT  DETECTED NOT DETECTED   Neisseria meningitidis NOT DETECTED NOT DETECTED   Pseudomonas aeruginosa NOT DETECTED NOT DETECTED   Stenotrophomonas maltophilia NOT DETECTED NOT DETECTED   Candida albicans NOT DETECTED NOT DETECTED   Candida auris NOT DETECTED NOT DETECTED   Candida glabrata NOT DETECTED NOT DETECTED   Candida krusei NOT DETECTED NOT DETECTED   Candida parapsilosis NOT DETECTED NOT DETECTED   Candida tropicalis NOT DETECTED NOT DETECTED   Cryptococcus neoformans/gattii NOT DETECTED NOT DETECTED    Celene Squibb, PharmD Clinical Pharmacist 04/18/2023 9:58 PM

## 2023-04-18 NOTE — Assessment & Plan Note (Signed)
Complicating factor to overall prognosis and care 

## 2023-04-18 NOTE — ED Notes (Signed)
Pt called out to go to the bathroom. Pt able to ambulate with a walker to the toilet. Standby assist provided. Linens changed. Pt resting comfortably. 93% on RA during ambulation. Pt on 2L of supplemental O2.

## 2023-04-18 NOTE — ED Notes (Signed)
Pt BS 417 this morning. This value is outside of the order parameters. MD made aware.

## 2023-04-18 NOTE — ED Provider Notes (Signed)
[facility] Provider Note    Event Date/Time   First MD Initiated Contact with Patient 04/18/23 [phone]     (approximate)   History   Shortness of Breath   HPI  Kristina Cruz is a 62 y.o. female who presents to the ED from home with a chief complaint of shortness of breath.  Patient with a history of asthma not on home oxygen.  Patient was hospitalized for COVID several weeks ago.  Endorses shortness of breath x 1 week.  Room air saturation 85%, placed on 2 L nasal cannula oxygen.  Daughter is a paramedic and states patient became acutely ill again 2 days ago but refused to come to the ED.  Denies fever/chills, chest pain, abdominal pain, nausea, vomiting or dizziness.     Past Medical History   Past Medical History:  Diagnosis Date   Anemia    Asthma    Back pain    Cataract    Diabetes mellitus without complication (HCC)    GERD (gastroesophageal reflux disease)    Hypertension    Morbid obesity with BMI of 60.0-69.9, adult (HCC)    Restless leg syndrome      Active Problem List   Patient Active Problem List   Diagnosis Date Noted   Respiratory failure with hypoxia (HCC) 02/10/2023   Diabetes mellitus type 2, uncomplicated (HCC) 02/08/2018   Abnormal ECG 12/31/2017   Chest pain with low risk for cardiac etiology 12/31/2017   Preop cardiovascular exam 12/31/2017   SOB (shortness of breath) on exertion 12/31/2017   Restless leg syndrome 12/16/2017   Hypertension 12/16/2017   Endometrial hyperplasia without atypia, simple 12/03/2017   Endometrial polyp 11/11/2017   Post-menopausal bleeding 11/11/2017   Endometrial thickening on ultrasound 04/27/2017   Morbid obesity (HCC) 04/27/2017     Past Surgical History   Past Surgical History:  Procedure Laterality Date   ABDOMINAL HYSTERECTOMY     CATARACT EXTRACTION W/PHACO Left 04/13/2020   Procedure: CATARACT EXTRACTION PHACO AND INTRAOCULAR LENS PLACEMENT (IOC);  Surgeon: Elliot Cousin,  MD;  Location: [facility] ORS;  Service: Ophthalmology;  Laterality: Left;  [place] 00:36.0 CDE 5.95 Fluid Pack lot # [id number]   HYSTEROSCOPY WITH D & C N/A 07/14/2017   Procedure: DILATATION AND CURETTAGE /HYSTEROSCOPY;  Surgeon: Nadara Mustard, MD;  Location: [facility] ORS;  Service: Gynecology;  Laterality: N/A;   POLYPECTOMY  2015     Home Medications   Prior to Admission medications   Medication Sig Start Date End Date Taking? Authorizing Provider  acetaminophen (TYLENOL 8 HOUR ARTHRITIS PAIN) 650 MG CR tablet Take 1,950 mg by mouth every 8 (eight) hours as needed for pain.    [provider]  amLODipine (NORVASC) 10 MG tablet Take 10 mg by mouth daily. 11/24/22   [provider]  atorvastatin (LIPITOR) 20 MG tablet Take 20 mg by mouth daily.    [provider]  baclofen (LIORESAL) 10 MG tablet Take 10 mg by mouth 3 (three) times daily.    [provider]  chlorpheniramine-HYDROcodone (TUSSIONEX) 10-8 MG/5ML Take 5 mLs by mouth every 12 (twelve) hours as needed for cough. 02/13/23   Sunnie Nielsen, DO  diphenhydrAMINE (BENADRYL) 50 MG tablet Take 1 tablet (50 mg total) by mouth every 6 (six) hours as needed for itching (swelling). 09/19/20   Shaune Pollack, MD  EPINEPHrine 0.3 mg/0.3 mL IJ SOAJ injection Inject 0.3 mg into the muscle as needed for anaphylaxis. 09/19/20   Shaune Pollack, MD  fluticasone furoate-vilanterol (BREO ELLIPTA) 100-25 MCG/ACT AEPB Inhale 1 puff into the lungs daily. 02/13/23   Sunnie Nielsen, DO  furosemide (LASIX) 40 MG tablet Take 40 mg by mouth daily. 03/23/20   [provider]  gabapentin (NEURONTIN) 300 MG capsule Take 600 mg by mouth 2 (two) times daily.    [provider]  guaiFENesin (MUCINEX) 600 MG 12 hr tablet Take 1 tablet (600 mg total) by mouth 2 (two) times daily as needed for cough or to loosen phlegm. 02/13/23   Sunnie Nielsen, DO  ipratropium-albuterol (DUONEB) 0.5-2.5 (3) MG/3ML SOLN Take 3 mLs by  nebulization every 4 (four) hours as needed (wheezing / shortness of breath). 02/13/23   Sunnie Nielsen, DO  metFORMIN (GLUCOPHAGE) 1000 MG tablet Take 1,000 mg by mouth 2 (two) times daily. 11/27/22   [provider]  OZEMPIC, 0.25 OR 0.5 MG/DOSE, 2 MG/3ML SOPN Inject into the skin.    [provider]  rOPINIRole (REQUIP) 2 MG tablet Take 2 mg by mouth in the morning and at bedtime.     [provider]  VENTOLIN HFA 108 (90 Base) MCG/ACT inhaler Inhale 2 puffs into the lungs every 4 (four) hours as needed for wheezing or shortness of breath. 02/13/23   Sunnie Nielsen, DO     Allergies  Contrast media [iodinated contrast media]   Family History   Family History  Problem Relation Age of Onset   Breast cancer Mother 2   Diabetes Mother    Hypertension Mother    Ovarian cancer Paternal Aunt        ?   Diabetes Father    Hypertension Father      Physical Exam  Triage Vital Signs: ED Triage Vitals [04/18/23 0033]  Encounter Vitals Group     BP      Systolic BP Percentile      Diastolic BP Percentile      Pulse      Resp      Temp      Temp src      SpO2      Weight      Height      Head Circumference      Peak Flow      Pain Score 0     Pain Loc      Pain Education      Exclude from Growth Chart     Updated Vital Signs: BP (!) 148/73   Pulse 73   Temp 99.5 F (37.5 C) (Oral)   Resp 17   Ht 5\' 2"  (1.575 m)   Wt 135.2 kg   LMP 01/04/2018   SpO2 97%   BMI 54.50 kg/m    General: Awake, moderate distress.  CV:  Tachycardic.  Good peripheral perfusion.  Resp:  Increased effort.  Diminished, scattered rhonchi and wheezing. Abd:  Obese, nontender.  No distention.  Other:  No pedal edema.   ED Results / Procedures / Treatments  Labs (all labs ordered are listed, but only abnormal results are displayed) Labs Reviewed  CBC WITH DIFFERENTIAL/PLATELET - Abnormal; Notable for the following components:      Result Value    Eosinophils Absolute 1.0 (*)    All other components within normal limits  COMPREHENSIVE METABOLIC PANEL - Abnormal; Notable for the following components:   Potassium 3.2 (*)    Chloride 97 (*)    Glucose, Bld 351 (*)    Creatinine, Ser 1.06 (*)    Calcium 8.5 (*)  AST 12 (*)    GFR, Estimated 59 (*)    All other components within normal limits  RESP PANEL BY RT-PCR (RSV, FLU A&B, COVID)  [id number]  CULTURE, BLOOD (ROUTINE X 2)  CULTURE, BLOOD (ROUTINE X 2)  BRAIN NATRIURETIC PEPTIDE  LACTIC ACID, PLASMA  LACTIC ACID, PLASMA  TROPONIN I (HIGH SENSITIVITY)  TROPONIN I (HIGH SENSITIVITY)     EKG  ED ECG REPORT I, Jaysin Gayler J, the attending physician, personally viewed and interpreted this ECG.   Date: 04/18/2023  EKG Time: 0043  Rate: 106  Rhythm: sinus tachycardia  Axis: Normal  Intervals:right bundle branch block, prolonged QTc 507  ST&T Change: Nonspecific    RADIOLOGY I have independently visualized and interpreted patient's x-ray as well as noted the radiology interpretation:  Chest x-ray: Interstitial edema versus atypical infection  Official radiology report(s): DG Chest Port 1 View  Result Date: 04/18/2023 CLINICAL DATA:  Shortness of breath.  History of COVID 2 weeks ago. EXAM: PORTABLE CHEST 1 VIEW COMPARISON:  Radiographs 02/10/2023 FINDINGS: Stable cardiomegaly. Pulmonary vascular congestion. Interstitial coarsening in the mid and lower lungs. No focal consolidation, pleural effusion, or pneumothorax. IMPRESSION: Findings suggestive of mild interstitial edema though atypical infection could appear similarly. Electronically Signed   By: Minerva Fester M.D.   On: 04/18/2023 01:47     PROCEDURES:  Critical Care performed: Yes, see critical care procedure note(s)  CRITICAL CARE Performed by: Irean Hong   Total critical care time: 45 minutes  Critical care time was exclusive of separately billable procedures and treating other patients.  Critical care  was necessary to treat or prevent imminent or life-threatening deterioration.  Critical care was time spent personally by me on the following activities: development of treatment plan with patient and/or surrogate as well as nursing, discussions with consultants, evaluation of patient's response to treatment, examination of patient, obtaining history from patient or surrogate, ordering and performing treatments and interventions, ordering and review of laboratory studies, ordering and review of radiographic studies, pulse oximetry and re-evaluation of patient's condition.   Marland Kitchen1-3 Lead EKG Interpretation  Performed by: Irean Hong, MD Authorized by: Irean Hong, MD     Interpretation: abnormal     ECG rate:  108   ECG rate assessment: tachycardic     Rhythm: sinus tachycardia     Ectopy: none     Conduction: normal   Comments:     Placed on cardiac monitor to evaluate for arrhythmias    MEDICATIONS ORDERED IN ED: Medications  cefTRIAXone (ROCEPHIN) 1 g in sodium chloride 0.9 % 100 mL IVPB (has no administration in time range)  azithromycin (ZITHROMAX) 500 mg in sodium chloride 0.9 % 250 mL IVPB (has no administration in time range)  ipratropium-albuterol (DUONEB) 0.5-2.5 (3) MG/3ML nebulizer solution 3 mL (has no administration in time range)  methylPREDNISolone sodium succinate (SOLU-MEDROL) 125 mg/2 mL injection 125 mg (125 mg Intravenous Given 04/18/23 0051)  ipratropium-albuterol (DUONEB) 0.5-2.5 (3) MG/3ML nebulizer solution 3 mL (3 mLs Nebulization Given 04/18/23 0050)  chlorpheniramine-HYDROcodone (TUSSIONEX) 10-8 MG/5ML suspension 5 mL (5 mLs Oral Given 04/18/23 0058)     IMPRESSION / MDM / ASSESSMENT AND PLAN / ED COURSE  I reviewed the triage vital signs and the nursing notes.                             62 year old female presenting with shortness of breath and hypoxia. Differential includes, but is not  limited to, viral syndrome, bronchitis including COPD exacerbation,  pneumonia, reactive airway disease including asthma, CHF including exacerbation with or without pulmonary/interstitial edema, pneumothorax, ACS, thoracic trauma, and pulmonary embolism.  Personally reviewed patient's records and note her specialization 6/25 - 02/13/2023 for acute respiratory failure with hypoxia.  Patient's presentation is most consistent with acute presentation with potential threat to life or bodily function.  The patient is on the cardiac monitor to evaluate for evidence of arrhythmia and/or significant heart rate changes.  Will obtain lab work, chest x-ray, COVID swab.  Administer 125 IV Solu-Medrol, DuoNeb, Tussionex for cough.  Will reassess.  Clinical Course as of 04/18/23 6387  Sat Apr 18, 2023  0159 Laboratory results unremarkable other than mild hypokalemia and hyperglycemia without elevation of anion gap.  X-ray demonstrates interstitial edema versus atypical infiltrates.  Will check blood cultures, lactic acid; initiate IV Rocephin with azithromycin. [JS]  0227 Patient continues to cough and wheeze.  Will administer another DuoNeb.  Updated her on laboratory and imaging results.  Will discuss with hospitalist services for evaluation and admission. [JS]    Clinical Course User Index [JS] Irean Hong, MD     FINAL CLINICAL IMPRESSION(S) / ED DIAGNOSES   Final diagnoses:  Moderate asthma with acute exacerbation, unspecified whether persistent  Hypoxia  Community acquired pneumonia, unspecified laterality  Sepsis, due to unspecified organism, unspecified whether acute organ dysfunction present [facility])     Rx / DC Orders   ED Discharge Orders     None        Note:  This document was prepared using Dragon voice recognition software and may include unintentional dictation errors.   Irean Hong, MD 04/18/23 [phone]

## 2023-04-18 NOTE — ED Notes (Signed)
Per MD give 20 units of Insulin for BS of 417

## 2023-04-18 NOTE — ED Notes (Addendum)
Pt c/o wheezing at this time. Wheezes audible. Pt requested PRN breathing treatment.

## 2023-04-18 NOTE — Assessment & Plan Note (Signed)
-   Continue amlodipine ?

## 2023-04-18 NOTE — Assessment & Plan Note (Signed)
Acute respiratory failure with hypoxia Possible multifocal pneumonia SIRS, possible sepsis Schedule and as needed DuoNebs IV steroids Will continue Rocephin and azithromycin for possible pneumonia IV hydration Antitussives Incentive spirometer, flutter valve Supplemental oxygen to keep sats over 92%

## 2023-04-18 NOTE — ED Triage Notes (Signed)
Pt to ED via POV c/o SOB. Pt has been short of breath x1week. Had covid 2 weeks ago, was admitted to hospital. Pt O2 sat 85% on RA. Pt has been using albuterol treatments at home with no relief. Denies CP, fevers, dizziness

## 2023-04-18 NOTE — Assessment & Plan Note (Signed)
Blood sugar over 300 Sliding scale insulin coverage

## 2023-04-19 DIAGNOSIS — J209 Acute bronchitis, unspecified: Secondary | ICD-10-CM | POA: Diagnosis not present

## 2023-04-19 DIAGNOSIS — J440 Chronic obstructive pulmonary disease with acute lower respiratory infection: Secondary | ICD-10-CM

## 2023-04-19 LAB — RESPIRATORY PANEL BY PCR

## 2023-04-19 LAB — BASIC METABOLIC PANEL
Anion gap: 10 (ref 5–15)
BUN: 14 mg/dL (ref 8–23)
CO2: 27 mmol/L (ref 22–32)
Calcium: 8.8 mg/dL — ABNORMAL LOW (ref 8.9–10.3)
Chloride: 101 mmol/L (ref 98–111)
Creatinine, Ser: 0.86 mg/dL (ref 0.44–1.00)
GFR, Estimated: >60 mL/min (ref >60)
Glucose, Bld: 272 mg/dL — ABNORMAL HIGH (ref 70–99)
Potassium: 4.4 mmol/L (ref 3.5–5.1)
Sodium: 138 mmol/L (ref 135–145)

## 2023-04-19 LAB — GLUCOSE, CAPILLARY
Glucose-Capillary: 272 mg/dL — ABNORMAL HIGH (ref 70–99)
Glucose-Capillary: 283 mg/dL — ABNORMAL HIGH (ref 70–99)
Glucose-Capillary: 270 mg/dL — ABNORMAL HIGH (ref 70–99)
Glucose-Capillary: 435 mg/dL — ABNORMAL HIGH (ref 70–99)
Glucose-Capillary: 256 mg/dL — ABNORMAL HIGH (ref 70–99)

## 2023-04-19 LAB — CBC
HCT: 39.6 % (ref 36.0–46.0)
Hemoglobin: 12.7 g/dL (ref 12.0–15.0)
MCH: 28.1 pg (ref 26.0–34.0)
MCHC: 32.1 g/dL (ref 30.0–36.0)
MCV: 87.6 fL (ref 80.0–100.0)
Platelets: 365 10*3/uL (ref 150–400)
RBC: 4.52 MIL/uL (ref 3.87–5.11)
RDW: 14.5 % (ref 11.5–15.5)
WBC: 13.3 10*3/uL — ABNORMAL HIGH (ref 4.0–10.5)
nRBC: 0 % (ref 0.0–0.2)

## 2023-04-19 LAB — MAGNESIUM: Magnesium: 2.5 mg/dL — ABNORMAL HIGH (ref 1.7–2.4)

## 2023-04-19 MED ORDER — MELATONIN 5 MG PO TABS
10.0000 mg | ORAL_TABLET | Freq: Every day | ORAL | Status: DC
  Administered 2023-04-19 – 2023-04-20 (×2): 10 mg via ORAL
  Filled 2023-04-19 (×2): qty 2

## 2023-04-19 MED ORDER — AZITHROMYCIN 250 MG PO TABS
500.0000 mg | ORAL_TABLET | Freq: Every day | ORAL | Status: DC
  Administered 2023-04-20 – 2023-04-21 (×2): 500 mg via ORAL
  Filled 2023-04-19 (×2): qty 2

## 2023-04-19 MED ORDER — PSEUDOEPHEDRINE HCL ER 120 MG PO TB12
120.0000 mg | ORAL_TABLET | Freq: Two times a day (BID) | ORAL | Status: DC
  Administered 2023-04-19 – 2023-04-21 (×4): 120 mg via ORAL
  Filled 2023-04-19 (×4): qty 1

## 2023-04-19 MED ORDER — GUAIFENESIN ER 600 MG PO TB12
600.0000 mg | ORAL_TABLET | Freq: Two times a day (BID) | ORAL | Status: DC
  Administered 2023-04-19 – 2023-04-21 (×4): 600 mg via ORAL
  Filled 2023-04-19 (×4): qty 1

## 2023-04-19 NOTE — Progress Notes (Signed)
  PROGRESS NOTE    Kristina Cruz  GEX:[id number] DOB: Sep 12, 1960 DOA: 04/18/2023 PCP: System, Provider Not In  207A/207A-AA  LOS: 1 day   Brief hospital course:   Assessment & Plan: Kristina Cruz is a 62 y.o. female with medical history significant for NIDDM T2, HTN, RLS, asthma, morbid obesity hospitalized a couple months ago with respiratory failure requiring up to 4 L, weaned off O2 prior to discharge who presents to the ED with a 1 week history of cough and shortness of breath not improving with home albuterol treatments.  EMS recording O2 sats of 85% on room air.    Acute respiratory failure with hypoxia 2/2 Asthma exacerbation --pt has significant congestion and cough, likely viral infection triggering asthma exacerbation.  Of note, pt does not have hx of COPD. --started on ceftriaxone, azithromycin, IV solumedrol and DuoNeb on presentation. Plan: --cont prednisone --cont scheduled DuoNeb --d/c ceftriaxone, cont azithromycin --RVP --start pseudoephedrine --Tussionex PRN  Uncontrolled type 2 diabetes mellitus  Hyperglycemia exacerbated by steroid  --A1c 8.0 --ACHS and SSI  Hypertension Continue amlodipine  Morbid obesity (HCC), BMI 54 Complicating factor to overall prognosis and care  Hx of COPD, ruled out   DVT prophylaxis: Lovenox SQ Code Status: Full code  Family Communication:  Level of care: Med-Surg Dispo:   The patient is from: home Anticipated d/c is to: home Anticipated d/c date is: 1-2 days   Subjective and Interval History:  Pt reported severe congestion, and cough.     Objective: Vitals:   04/18/23 1945 04/19/23 0502 04/19/23 0838 04/19/23 1034  BP: (!) 140/72 138/71 131/65   Pulse: 98 62 76 65  Resp: 17 16 18    Temp: 98.2 F (36.8 C) 97.9 F (36.6 C) 98 F (36.7 C)   TempSrc: Oral Oral    SpO2: 98% 98% 97% 98%  Weight:      Height:        Intake/Output Summary (Last 24 hours) at 04/19/2023 1739 Last data filed at 04/19/2023  1519 Gross per 24 hour  Intake 990.24 ml  Output --  Net 990.24 ml   Filed Weights   04/18/23 0044  Weight: 135.2 kg    Examination:   Constitutional: NAD, AAOx3 HEENT: conjunctivae and lids normal, EOMI CV: No cyanosis.   RESP: normal respiratory effort, on RA, congested  Neuro: II - XII grossly intact.   Psych: Normal mood and affect.  Appropriate judgement and reason   Data Reviewed: I have personally reviewed labs and imaging studies  Time spent: 50 minutes  Darlin Priestly, MD [facility] If 7PM-7AM, please contact night-coverage 04/19/2023, 5:39 PM

## 2023-04-19 NOTE — Plan of Care (Signed)

## 2023-04-20 DIAGNOSIS — J209 Acute bronchitis, unspecified: Secondary | ICD-10-CM | POA: Diagnosis not present

## 2023-04-20 DIAGNOSIS — J440 Chronic obstructive pulmonary disease with acute lower respiratory infection: Secondary | ICD-10-CM | POA: Diagnosis not present

## 2023-04-20 LAB — BASIC METABOLIC PANEL
Anion gap: 8 (ref 5–15)
BUN: 18 mg/dL (ref 8–23)
CO2: 27 mmol/L (ref 22–32)
Calcium: 8.6 mg/dL — ABNORMAL LOW (ref 8.9–10.3)
Chloride: 102 mmol/L (ref 98–111)
Creatinine, Ser: 0.89 mg/dL (ref 0.44–1.00)
GFR, Estimated: >60 mL/min (ref >60)
Glucose, Bld: 200 mg/dL — ABNORMAL HIGH (ref 70–99)
Potassium: 4 mmol/L (ref 3.5–5.1)
Sodium: 137 mmol/L (ref 135–145)

## 2023-04-20 LAB — GLUCOSE, CAPILLARY
Glucose-Capillary: 174 mg/dL — ABNORMAL HIGH (ref 70–99)
Glucose-Capillary: 304 mg/dL — ABNORMAL HIGH (ref 70–99)
Glucose-Capillary: 291 mg/dL — ABNORMAL HIGH (ref 70–99)
Glucose-Capillary: 130 mg/dL — ABNORMAL HIGH (ref 70–99)

## 2023-04-20 LAB — CBC
HCT: 36.5 % (ref 36.0–46.0)
Hemoglobin: 11.8 g/dL — ABNORMAL LOW (ref 12.0–15.0)
MCH: 28.2 pg (ref 26.0–34.0)
MCHC: 32.3 g/dL (ref 30.0–36.0)
MCV: 87.1 fL (ref 80.0–100.0)
Platelets: 381 10*3/uL (ref 150–400)
RBC: 4.19 MIL/uL (ref 3.87–5.11)
RDW: 14.6 % (ref 11.5–15.5)
WBC: 10.2 10*3/uL (ref 4.0–10.5)
nRBC: 0 % (ref 0.0–0.2)

## 2023-04-20 LAB — MAGNESIUM: Magnesium: 2.7 mg/dL — ABNORMAL HIGH (ref 1.7–2.4)

## 2023-04-20 MED ORDER — FLUTICASONE FUROATE-VILANTEROL 100-25 MCG/ACT IN AEPB
1.0000 | INHALATION_SPRAY | Freq: Every day | RESPIRATORY_TRACT | Status: DC
  Administered 2023-04-20 – 2023-04-21 (×2): 1 via RESPIRATORY_TRACT
  Filled 2023-04-20: qty 28

## 2023-04-20 NOTE — Plan of Care (Signed)
  Problem: Education: Goal: Knowledge of disease or condition will improve Outcome: Progressing   Problem: Activity: Goal: Will verbalize the importance of balancing activity with adequate rest periods Outcome: Progressing   Problem: Activity: Goal: Ability to tolerate increased activity will improve Outcome: Progressing   Problem: Respiratory: Goal: Levels of oxygenation will improve Outcome: Progressing

## 2023-04-20 NOTE — Progress Notes (Signed)
Mobility Specialist - Progress Note  During mobility: HR(139), SpO2(89) Post-mobility: HR(100), SPO2(95)     04/20/23 1752  Mobility  Activity Ambulated with assistance in hallway  Level of Assistance Standby assist, set-up cues, supervision of patient - no hands on  Assistive Device Front wheel walker  Distance Ambulated (ft) 170 ft  Range of Motion/Exercises Active  Activity Response Tolerated well  Mobility Referral Yes  $Mobility charge 1 Mobility  Mobility Specialist Start Time (ACUTE ONLY) 1720  Mobility Specialist Stop Time (ACUTE ONLY) 1752  Mobility Specialist Time Calculation (min) (ACUTE ONLY) 32 min   Pt resting EOB upon entry on RA. Pt STS and ambulates to hallway around NS with RW SBA. Pt heavily leans on walker during ambulation but, maintains upright standing. Pt given education on proper posturing with RW. Pt very motivated to participate in ambulation and pt declined rest breaks throughout session. Pt returned to EOB and left with needs in reach.

## 2023-04-20 NOTE — Progress Notes (Signed)
  PROGRESS NOTE    Kristina Cruz  UJW:[id number] DOB: 07-20-1961 DOA: 04/18/2023 PCP: System, Provider Not In  207A/207A-AA  LOS: 2 days   Brief hospital course:   Assessment & Plan: Kristina Cruz is a 62 y.o. female with medical history significant for NIDDM T2, HTN, RLS, asthma, morbid obesity hospitalized a couple months ago with respiratory failure requiring up to 4 L, weaned off O2 prior to discharge who presents to the ED with a 1 week history of cough and shortness of breath not improving with home albuterol treatments.  EMS recording O2 sats of 85% on room air.    Acute respiratory failure with hypoxia  --2/2 asthma exacerbation.  Initially needed 2L O2, now weaned down to RA. --ambulation test today found pt sating in 90's during ambulation, however, pt was very weak and winded. --cont mobility  Asthma exacerbation --pt has significant congestion and cough, likely viral infection triggering asthma exacerbation.  Though covid and RVP neg.  Of note, pt does not have hx of COPD. --started on ceftriaxone, azithromycin, IV solumedrol and DuoNeb on presentation.  Abx d/c'ed as no strong evidence of bacterial PNA.   Plan: --cont prednisone --cont scheduled DuoNeb --cont pseudoephedrine --Tussionex PRN  Uncontrolled type 2 diabetes mellitus  Hyperglycemia exacerbated by steroid  --A1c 8.0 --ACHS and SSI  Hypertension Continue amlodipine  Morbid obesity (HCC), BMI 54 Complicating factor to overall prognosis and care  Hx of COPD, ruled out   DVT prophylaxis: Lovenox SQ Code Status: Full code  Family Communication:  Level of care: Med-Surg Dispo:   The patient is from: home Anticipated d/c is to: home Anticipated d/c date is: 1-2 days   Subjective and Interval History:  Pt reported congestion improved with Sudafed, and she slept well last night with melatonin.  Still coughing a lot.   Objective: Vitals:   04/20/23 0407 04/20/23 0719 04/20/23 0742  04/20/23 1522  BP:   (!) 150/98 (!) 157/85  Pulse:   77 85  Resp:   20 20  Temp:   97.6 F (36.4 C) 98.5 F (36.9 C)  TempSrc:   Oral Oral  SpO2: 94% 96% 98% 99%  Weight:      Height:        Intake/Output Summary (Last 24 hours) at 04/20/2023 1852 Last data filed at 04/20/2023 1427 Gross per 24 hour  Intake 1200 ml  Output 1 ml  Net 1199 ml   Filed Weights   04/18/23 0044  Weight: 135.2 kg    Examination:   Constitutional: NAD, AAOx3 HEENT: conjunctivae and lids normal, EOMI CV: No cyanosis.   RESP: normal respiratory effort, on RA Neuro: II - XII grossly intact.   Psych: Normal mood and affect.  Appropriate judgement and reason   Data Reviewed: I have personally reviewed labs and imaging studies  Time spent: 35 minutes  Darlin Priestly, MD [facility] If 7PM-7AM, please contact night-coverage 04/20/2023, 6:52 PM

## 2023-04-21 ENCOUNTER — Encounter (INDEPENDENT_AMBULATORY_CARE_PROVIDER_SITE_OTHER): Payer: 59 | Admitting: Vascular Surgery

## 2023-04-21 DIAGNOSIS — J209 Acute bronchitis, unspecified: Secondary | ICD-10-CM | POA: Diagnosis not present

## 2023-04-21 DIAGNOSIS — J440 Chronic obstructive pulmonary disease with acute lower respiratory infection: Secondary | ICD-10-CM | POA: Diagnosis not present

## 2023-04-21 LAB — GLUCOSE, CAPILLARY
Glucose-Capillary: 376 mg/dL — ABNORMAL HIGH (ref 70–99)
Glucose-Capillary: 304 mg/dL — ABNORMAL HIGH (ref 70–99)
Glucose-Capillary: 211 mg/dL — ABNORMAL HIGH (ref 70–99)

## 2023-04-21 LAB — CULTURE, BLOOD (ROUTINE X 2)

## 2023-04-21 MED ORDER — MELATONIN 10 MG PO TABS: 10.0000 mg | ORAL_TABLET | Freq: Every evening | ORAL | Status: DC | PRN

## 2023-04-21 MED ORDER — PSEUDOEPHEDRINE HCL ER 120 MG PO TB12: 120.0000 mg | ORAL_TABLET | Freq: Two times a day (BID) | ORAL | Status: AC | PRN

## 2023-04-21 MED ORDER — ACETAMINOPHEN ER 650 MG PO TBCR: 1950.0000 mg | EXTENDED_RELEASE_TABLET | Freq: Three times a day (TID) | ORAL | Status: DC | PRN

## 2023-04-21 MED ORDER — HYDROCOD POLI-CHLORPHE POLI ER 10-8 MG/5ML PO SUER: 5.0000 mL | Freq: Two times a day (BID) | ORAL | 0 refills | Status: DC | PRN

## 2023-04-21 NOTE — TOC CM/SW Note (Signed)
Transition of Care [facility]) - Inpatient Brief Assessment   Patient Details  Name: Kristina Cruz MRN: [id number] Date of Birth: [birth year]  [facility] CM/SW Contact:    Margarito Liner, LCSW Phone Number: 04/21/2023, 9:59 AM   Clinical Narrative: Patient has orders to discharge home today. Chart reviewed. No TOC needs identified. CSW signing off.  Transition of Care Asessment: Insurance and Status: Insurance coverage has been reviewed Patient has primary care physician: Yes Home environment has been reviewed: Apartment Prior level of function:: Not documented Prior/Current Home Services: No current home services Social Determinants of Health Reivew: SDOH reviewed no interventions necessary Readmission risk has been reviewed: Yes Transition of care needs: no transition of care needs at this time

## 2023-04-21 NOTE — Discharge Summary (Addendum)
Physician Discharge Summary   Kristina Cruz  female DOB: 12-10-1960  NWG:[id number]  PCP: System, Provider Not In  Admit date: 04/18/2023 Discharge date: 04/21/2023  Admitted From: home Disposition:  home CODE STATUS: Full code  Discharge Instructions     Diet - low sodium heart healthy   Complete by: As directed       Hospital Course:  For full details, please see H&P, progress notes, consult notes and ancillary notes.  Briefly,  Kristina Cruz is a 62 y.o. female with medical history significant for NIDDM T2, HTN, asthma, morbid obesity, hospitalized a couple months ago with respiratory failure requiring up to 4 L, weaned off O2 prior to discharge who presented to the ED with a 1 week history of cough and shortness of breath not improving with home albuterol treatments.  EMS recording O2 sats of 85% on room air.    Acute respiratory failure with hypoxia  --2/2 asthma exacerbation.  Initially needed 2L O2, weaned down to RA prior to discharge (O2 sats 90's during ambulation).   Asthma exacerbation --pt has significant congestion and cough, likely viral infection triggering asthma exacerbation.  Though covid and RVP neg.  Of note, pt does not have hx of COPD. --started on ceftriaxone, azithromycin, IV solumedrol and DuoNeb on presentation.  Ceftriaxone d/c'ed as no strong evidence of bacterial PNA.   --Pt completed 4 days of steroid burst with prednisone and azithromycin. --cont pseudoephedrine for congestion for 3 more days after discharge.   --Tussionex PRN   Uncontrolled type 2 diabetes mellitus  Hyperglycemia exacerbated by steroid  --A1c 8.0 --received SSI during hospitalization.  Discharged back on home regimen as below.   Hypertension cont amlodipine Resume home lasix after discharge.   Morbid obesity (HCC), BMI 54 Complicating factor to overall prognosis and care   Hx of COPD, ruled out  Sepsis ruled out   Discharge Diagnoses:  Principal  Problem:   COPD with acute bronchitis (HCC) Active Problems:   Acute respiratory failure with hypoxia (HCC)   Uncontrolled type 2 diabetes mellitus with hyperglycemia, without long-term current use of insulin (HCC)   Morbid obesity (HCC)   Hypertension   COPD with acute exacerbation (HCC)   30 Day Unplanned Readmission Risk Score    Flowsheet Row ED to Hosp-Admission (Current) from 04/18/2023 in [facility]  30 Day Unplanned Readmission Risk Score (%) 17.38 Filed at 04/21/2023 0801       This score is the patient's risk of an unplanned readmission within 30 days of being discharged (0 -100%). The score is based on dignosis, age, lab data, medications, orders, and past utilization.   Low:  0-14.9   Medium: 15-21.9   High: 22-29.9   Extreme: 30 and above         Discharge Instructions:  Allergies as of 04/21/2023       Reactions   Contrast Media [iodinated Contrast Media] Hives        Medication List     TAKE these medications    acetaminophen 650 MG CR tablet Commonly known as: Tylenol 8 Hour Arthritis Pain Take 3 tablets (1,950 mg total) by mouth every 8 (eight) hours as needed for pain. Do not exceed 4000 mg total in a day.  Home med. What changed: additional instructions   amLODipine 10 MG tablet Commonly known as: NORVASC Take 10 mg by mouth daily.   atorvastatin 20 MG tablet Commonly known as: LIPITOR Take 20 mg by mouth daily.  baclofen 10 MG tablet Commonly known as: LIORESAL Take 10 mg by mouth 3 (three) times daily.   chlorpheniramine-HYDROcodone 10-8 MG/5ML Commonly known as: TUSSIONEX Take 5 mLs by mouth every 12 (twelve) hours as needed for cough.   diphenhydrAMINE 50 MG tablet Commonly known as: BENADRYL Take 1 tablet (50 mg total) by mouth every 6 (six) hours as needed for itching (swelling).   EPINEPHrine 0.3 mg/0.3 mL Soaj injection Commonly known as: EPI-PEN Inject 0.3 mg into the muscle as needed  for anaphylaxis.   fluticasone furoate-vilanterol 100-25 MCG/ACT Aepb Commonly known as: Breo Ellipta Inhale 1 puff into the lungs daily.   furosemide 40 MG tablet Commonly known as: LASIX Take 40 mg by mouth daily.   gabapentin 300 MG capsule Commonly known as: NEURONTIN Take 600 mg by mouth 2 (two) times daily.   guaiFENesin 600 MG 12 hr tablet Commonly known as: MUCINEX Take 1 tablet (600 mg total) by mouth 2 (two) times daily as needed for cough or to loosen phlegm.   ipratropium-albuterol 0.5-2.5 (3) MG/3ML Soln Commonly known as: DUONEB Take 3 mLs by nebulization every 4 (four) hours as needed (wheezing / shortness of breath).   Melatonin 10 MG Tabs Take 10 mg by mouth at bedtime as needed.   metFORMIN 1000 MG tablet Commonly known as: GLUCOPHAGE Take 1,000 mg by mouth 2 (two) times daily.   Ozempic (0.25 or 0.5 MG/DOSE) 2 MG/3ML Sopn Generic drug: Semaglutide(0.25 or 0.5MG /DOS) Inject into the skin.   pseudoephedrine 120 MG 12 hr tablet Commonly known as: SUDAFED Take 1 tablet (120 mg total) by mouth every 12 (twelve) hours as needed for up to 3 days for congestion.   rOPINIRole 2 MG tablet Commonly known as: REQUIP Take 2 mg by mouth in the morning and at bedtime.   Ventolin HFA 108 (90 Base) MCG/ACT inhaler Generic drug: albuterol Inhale 2 puffs into the lungs every 4 (four) hours as needed for wheezing or shortness of breath.         Follow-up Information     Your PCP Follow up in 1 week(s).                  Allergies  Allergen Reactions   Contrast Media [Iodinated Contrast Media] Hives     The results of significant diagnostics from this hospitalization (including imaging, microbiology, ancillary and laboratory) are listed below for reference.   Consultations:   Procedures/Studies: DG Chest Port 1 View  Result Date: 04/18/2023 CLINICAL DATA:  Shortness of breath.  History of COVID 2 weeks ago. EXAM: PORTABLE CHEST 1 VIEW  COMPARISON:  Radiographs 02/10/2023 FINDINGS: Stable cardiomegaly. Pulmonary vascular congestion. Interstitial coarsening in the mid and lower lungs. No focal consolidation, pleural effusion, or pneumothorax. IMPRESSION: Findings suggestive of mild interstitial edema though atypical infection could appear similarly. Electronically Signed   By: Minerva Fester M.D.   On: 04/18/2023 01:47      Labs: BNP (last 3 results) Recent Labs    02/10/23 1407 04/18/23 0045  BNP 13.6 23.1   Basic Metabolic Panel: Recent Labs  Lab 04/18/23 0045 04/19/23 0419 04/20/23 0338  NA 136 138 137  K 3.2* 4.4 4.0  CL 97* 101 102  CO2 29 27 27   GLUCOSE 351* 272* 200*  BUN 11 14 18   CREATININE 1.06* 0.86 0.89  CALCIUM 8.5* 8.8* 8.6*  MG  --  2.5* 2.7*   Liver Function Tests: Recent Labs  Lab 04/18/23 0045  AST 12*  ALT 18  ALKPHOS 86  BILITOT 0.5  PROT 7.6  ALBUMIN 3.8   No results for input(s): "LIPASE", "AMYLASE" in the last 168 hours. No results for input(s): "AMMONIA" in the last 168 hours. CBC: Recent Labs  Lab 04/18/23 0045 04/19/23 0419 04/20/23 0338  WBC 9.3 13.3* 10.2  NEUTROABS 5.3  --   --   HGB 12.9 12.7 11.8*  HCT 40.9 39.6 36.5  MCV 88.7 87.6 87.1  PLT 385 365 381   Cardiac Enzymes: No results for input(s): "CKTOTAL", "CKMB", "CKMBINDEX", "TROPONINI" in the last 168 hours. BNP: Invalid input(s): "POCBNP" CBG: Recent Labs  Lab 04/20/23 0745 04/20/23 1139 04/20/23 1712 04/20/23 2140 04/21/23 0749  GLUCAP 174* 304* 291* 130* 304*   D-Dimer No results for input(s): "DDIMER" in the last 72 hours. Hgb A1c No results for input(s): "HGBA1C" in the last 72 hours. Lipid Profile No results for input(s): "CHOL", "HDL", "LDLCALC", "TRIG", "CHOLHDL", "LDLDIRECT" in the last 72 hours. Thyroid function studies No results for input(s): "TSH", "T4TOTAL", "T3FREE", "THYROIDAB" in the last 72 hours.  Invalid input(s): "FREET3" Anemia work up No results for input(s):  "VITAMINB12", "FOLATE", "FERRITIN", "TIBC", "IRON", "RETICCTPCT" in the last 72 hours. Urinalysis    Component Value Date/Time   COLORURINE YELLOW (A) 10/16/2022 2321   APPEARANCEUR CLEAR (A) 10/16/2022 2321   APPEARANCEUR Cloudy 08/11/2013 0124   LABSPEC 1.018 10/16/2022 2321   LABSPEC 1.021 08/11/2013 0124   PHURINE 5.0 10/16/2022 2321   GLUCOSEU 50 (A) 10/16/2022 2321   GLUCOSEU Negative 08/11/2013 0124   HGBUR NEGATIVE 10/16/2022 2321   BILIRUBINUR NEGATIVE 10/16/2022 2321   BILIRUBINUR Negative 08/11/2013 0124   KETONESUR 20 (A) 10/16/2022 2321   PROTEINUR 30 (A) 10/16/2022 2321   NITRITE NEGATIVE 10/16/2022 2321   LEUKOCYTESUR NEGATIVE 10/16/2022 2321   LEUKOCYTESUR Negative 08/11/2013 0124   Sepsis Labs Recent Labs  Lab 04/18/23 0045 04/19/23 0419 04/20/23 0338  WBC 9.3 13.3* 10.2   Microbiology Recent Results (from the past 240 hour(s))  Resp panel by RT-PCR (RSV, Flu A&B, Covid) Anterior Nasal Swab     Status: None   Collection Time: 04/18/23 12:45 AM   Specimen: Anterior Nasal Swab  Result Value Ref Range Status   SARS Coronavirus 2 by RT PCR NEGATIVE NEGATIVE Final    Comment: (NOTE) SARS-CoV-2 target nucleic acids are NOT DETECTED.  The SARS-CoV-2 RNA is generally detectable in upper respiratory specimens during the acute phase of infection. The lowest concentration of SARS-CoV-2 viral copies this assay can detect is 138 copies/mL. A negative result does not preclude SARS-Cov-2 infection and should not be used as the sole basis for treatment or other patient management decisions. A negative result may occur with  improper specimen collection/handling, submission of specimen other than nasopharyngeal swab, presence of viral mutation(s) within the areas targeted by this assay, and inadequate number of viral copies(<138 copies/mL). A negative result must be combined with clinical observations, patient history, and epidemiological information. The expected  result is Negative.  Fact Sheet for Patients:  BloggerCourse.com  Fact Sheet for Healthcare Providers:  SeriousBroker.it  This test is no t yet approved or cleared by the [place] FDA and  has been authorized for detection and/or diagnosis of SARS-CoV-2 by FDA under an Emergency Use Authorization (EUA). This EUA will remain  in effect (meaning this test can be used) for the duration of the COVID-19 declaration under Section 564(b)(1) of the Act, 21 U.S.C.section 360bbb-3(b)(1), unless the authorization is terminated  or revoked sooner.  Influenza A by PCR NEGATIVE NEGATIVE Final   Influenza B by PCR NEGATIVE NEGATIVE Final    Comment: (NOTE) The Xpert Xpress SARS-CoV-2/FLU/RSV plus assay is intended as an aid in the diagnosis of influenza from Nasopharyngeal swab specimens and should not be used as a sole basis for treatment. Nasal washings and aspirates are unacceptable for Xpert Xpress SARS-CoV-2/FLU/RSV testing.  Fact Sheet for Patients: BloggerCourse.com  Fact Sheet for Healthcare Providers: SeriousBroker.it  This test is not yet approved or cleared by the [place] FDA and has been authorized for detection and/or diagnosis of SARS-CoV-2 by FDA under an Emergency Use Authorization (EUA). This EUA will remain in effect (meaning this test can be used) for the duration of the COVID-19 declaration under Section 564(b)(1) of the Act, 21 U.S.C. section 360bbb-3(b)(1), unless the authorization is terminated or revoked.     Resp Syncytial Virus by PCR NEGATIVE NEGATIVE Final    Comment: (NOTE) Fact Sheet for Patients: BloggerCourse.com  Fact Sheet for Healthcare Providers: SeriousBroker.it  This test is not yet approved or cleared by the [place] FDA and has been authorized for detection and/or diagnosis of  SARS-CoV-2 by FDA under an Emergency Use Authorization (EUA). This EUA will remain in effect (meaning this test can be used) for the duration of the COVID-19 declaration under Section 564(b)(1) of the Act, 21 U.S.C. section 360bbb-3(b)(1), unless the authorization is terminated or revoked.  Performed at [facility], [address] [place], [place] [address]   Culture, blood (routine x 2)     Status: None (Preliminary result)   Collection Time: 04/18/23  3:30 AM   Specimen: BLOOD  Result Value Ref Range Status   Specimen Description BLOOD BLOOD RIGHT ARM  Final   Special Requests   Final    BOTTLES DRAWN AEROBIC AND ANAEROBIC Blood Culture adequate volume   Culture   Final    NO GROWTH 3 DAYS Performed at [facility], [address] [place], [place] [address]    Report Status PENDING  Incomplete  Culture, blood (routine x 2)     Status: Abnormal (Preliminary result)   Collection Time: 04/18/23  3:30 AM   Specimen: BLOOD  Result Value Ref Range Status   Specimen Description   Final    BLOOD BLOOD LEFT ARM Performed at [facility], [address], [place], [place] [address]    Special Requests   Final    BOTTLES DRAWN AEROBIC AND ANAEROBIC Blood Culture results may not be optimal due to an inadequate volume of blood received in culture bottles Performed at [facility], [address] [place], [place] [address]    Culture  Setup Time   Final    GRAM POSITIVE COCCI ANAEROBIC BOTTLE ONLY CRITICAL RESULT CALLED TO, READ BACK BY AND VERIFIED WITH: CAROLYN COULTER @ 2149 04/18/23 LFD    Culture (A)  Final    STAPHYLOCOCCUS HOMINIS THE SIGNIFICANCE OF ISOLATING THIS ORGANISM FROM A SINGLE SET OF BLOOD CULTURES WHEN MULTIPLE SETS ARE DRAWN IS UNCERTAIN. PLEASE NOTIFY THE MICROBIOLOGY DEPARTMENT WITHIN ONE WEEK IF SPECIATION AND SENSITIVITIES ARE REQUIRED. Performed at [facility], [address] [place], [place] [address]     Report Status PENDING  Incomplete  Blood Culture ID Panel (Reflexed)     Status: Abnormal   Collection Time: 04/18/23  3:30 AM  Result Value Ref Range Status   Enterococcus faecalis NOT DETECTED NOT DETECTED Final   Enterococcus Faecium NOT DETECTED NOT DETECTED  Final   Listeria monocytogenes NOT DETECTED NOT DETECTED Final   Staphylococcus species DETECTED (A) NOT DETECTED Final    Comment: CRITICAL RESULT CALLED TO, READ BACK BY AND VERIFIED WITH: CAROLYN COULTER @ 2149 04/18/23 LFD    Staphylococcus aureus (BCID) NOT DETECTED NOT DETECTED Final   Staphylococcus epidermidis NOT DETECTED NOT DETECTED Final   Staphylococcus lugdunensis NOT DETECTED NOT DETECTED Final   Streptococcus species NOT DETECTED NOT DETECTED Final   Streptococcus agalactiae NOT DETECTED NOT DETECTED Final   Streptococcus pneumoniae NOT DETECTED NOT DETECTED Final   Streptococcus pyogenes NOT DETECTED NOT DETECTED Final   A.calcoaceticus-baumannii NOT DETECTED NOT DETECTED Final   Bacteroides fragilis NOT DETECTED NOT DETECTED Final   Enterobacterales NOT DETECTED NOT DETECTED Final   Enterobacter cloacae complex NOT DETECTED NOT DETECTED Final   Escherichia coli NOT DETECTED NOT DETECTED Final   Klebsiella aerogenes NOT DETECTED NOT DETECTED Final   Klebsiella oxytoca NOT DETECTED NOT DETECTED Final   Klebsiella pneumoniae NOT DETECTED NOT DETECTED Final   Proteus species NOT DETECTED NOT DETECTED Final   Salmonella species NOT DETECTED NOT DETECTED Final   Serratia marcescens NOT DETECTED NOT DETECTED Final   Haemophilus influenzae NOT DETECTED NOT DETECTED Final   Neisseria meningitidis NOT DETECTED NOT DETECTED Final   Pseudomonas aeruginosa NOT DETECTED NOT DETECTED Final   Stenotrophomonas maltophilia NOT DETECTED NOT DETECTED Final   Candida albicans NOT DETECTED NOT DETECTED Final   Candida auris NOT DETECTED NOT DETECTED Final   Candida glabrata NOT DETECTED NOT DETECTED Final   Candida krusei NOT  DETECTED NOT DETECTED Final   Candida parapsilosis NOT DETECTED NOT DETECTED Final   Candida tropicalis NOT DETECTED NOT DETECTED Final   Cryptococcus neoformans/gattii NOT DETECTED NOT DETECTED Final    Comment: Performed at [facility], [address] [place], [place] [address]  Culture, blood (Routine X 2) w Reflex to ID Panel     Status: None (Preliminary result)   Collection Time: 04/18/23 10:52 PM   Specimen: BLOOD RIGHT HAND  Result Value Ref Range Status   Specimen Description BLOOD RIGHT HAND  Final   Special Requests   Final    BOTTLES DRAWN AEROBIC AND ANAEROBIC Blood Culture adequate volume   Culture   Final    NO GROWTH 3 DAYS Performed at [facility], [address] [place], [place] [address]    Report Status PENDING  Incomplete  Culture, blood (Routine X 2) w Reflex to ID Panel     Status: None (Preliminary result)   Collection Time: 04/18/23 11:05 PM   Specimen: BLOOD RIGHT ARM  Result Value Ref Range Status   Specimen Description BLOOD RIGHT ARM  Final   Special Requests   Final    BOTTLES DRAWN AEROBIC AND ANAEROBIC Blood Culture adequate volume   Culture   Final    NO GROWTH 3 DAYS Performed at [facility], [address] [place], [place] [address]    Report Status PENDING  Incomplete  Respiratory (~20 pathogens) panel by PCR     Status: None   Collection Time: 04/19/23  9:52 AM   Specimen: Nasopharyngeal Swab; Respiratory  Result Value Ref Range Status   Adenovirus NOT DETECTED NOT DETECTED Final   Coronavirus 229E NOT DETECTED NOT DETECTED Final    Comment: (NOTE) The Coronavirus on the Respiratory Panel, DOES NOT test for the novel  Coronavirus (2019 nCoV)    Coronavirus HKU1 NOT DETECTED NOT DETECTED Final   Coronavirus  NL63 NOT DETECTED NOT DETECTED Final   Coronavirus OC43 NOT DETECTED NOT DETECTED Final   Metapneumovirus NOT DETECTED NOT DETECTED Final   Rhinovirus / Enterovirus NOT DETECTED NOT DETECTED Final    Influenza A NOT DETECTED NOT DETECTED Final   Influenza B NOT DETECTED NOT DETECTED Final   Parainfluenza Virus 1 NOT DETECTED NOT DETECTED Final   Parainfluenza Virus 2 NOT DETECTED NOT DETECTED Final   Parainfluenza Virus 3 NOT DETECTED NOT DETECTED Final   Parainfluenza Virus 4 NOT DETECTED NOT DETECTED Final   Respiratory Syncytial Virus NOT DETECTED NOT DETECTED Final   Bordetella pertussis NOT DETECTED NOT DETECTED Final   Bordetella Parapertussis NOT DETECTED NOT DETECTED Final   Chlamydophila pneumoniae NOT DETECTED NOT DETECTED Final   Mycoplasma pneumoniae NOT DETECTED NOT DETECTED Final    Comment: Performed at [facility], [address], [place], [place] [address]     Total time spend on discharging this patient, including the last patient exam, discussing the hospital stay, instructions for ongoing care as it relates to all pertinent caregivers, as well as preparing the medical discharge records, prescriptions, and/or referrals as applicable, is 35 minutes.    Darlin Priestly, MD  [facility] 04/21/2023, 9:22 AM

## 2023-04-21 NOTE — Inpatient Diabetes Management (Signed)
Inpatient Diabetes Program Recommendations  AACE/ADA: New Consensus Statement on Inpatient Glycemic Control (2015)  Target Ranges:  Prepandial:   less than 140 mg/dL      Peak postprandial:   less than 180 mg/dL (1-2 hours)      Critically ill patients:  140 - 180 mg/dL    Latest Reference Range & Units 04/20/23 07:45 04/20/23 11:39 04/20/23 17:12 04/20/23 21:40  Glucose-Capillary 70 - 99 mg/dL 784 (H)  4 units Novolog  304 (H)  15 units Novolog  291 (H)  11 units Novolog  130 (H)  (H): Data is abnormally high  Latest Reference Range & Units 04/21/23 07:49  Glucose-Capillary 70 - 99 mg/dL 696 (H)  (H): Data is abnormally high   Admit with:  COPD with acute bronchitis (HCC) Acute respiratory failure with hypoxia Possible multifocal pneumonia SIRS, possible sepsis  History: DM  Home DM Meds: Metformin 1000 mg BID       Ozempic Qweek  Current Orders: Novolog Resistant Correction Scale/ SSI (0-20 units) TID AC + HS    MD- Please consider while pt getting Prednisone 40 mg daily:  1. Start Semglee 10 units Daily (0.075 units/kg)  2. Start Novolog Meal Coverage: Novolog 4 units TID with meals HOLD if pt NPO HOLD if pt eats <50% meals    --Will follow patient during hospitalization--  Ambrose Finland RN, MSN, CDCES Diabetes Coordinator Inpatient Glycemic Control Team Team Pager: [phone] (8a-5p)

## 2023-04-21 NOTE — Plan of Care (Signed)
  Problem: Education: Goal: Knowledge of disease or condition will improve Outcome: Adequate for Discharge Goal: Knowledge of the prescribed therapeutic regimen will improve Outcome: Adequate for Discharge Goal: Individualized Educational Video(s) Outcome: Adequate for Discharge   Problem: Activity: Goal: Ability to tolerate increased activity will improve Outcome: Adequate for Discharge Goal: Will verbalize the importance of balancing activity with adequate rest periods Outcome: Adequate for Discharge   Problem: Respiratory: Goal: Ability to maintain a clear airway will improve Outcome: Adequate for Discharge Goal: Levels of oxygenation will improve Outcome: Adequate for Discharge Goal: Ability to maintain adequate ventilation will improve Outcome: Adequate for Discharge   Problem: Activity: Goal: Ability to tolerate increased activity will improve Outcome: Adequate for Discharge   Problem: Clinical Measurements: Goal: Ability to maintain a body temperature in the normal range will improve Outcome: Adequate for Discharge   Problem: Respiratory: Goal: Ability to maintain adequate ventilation will improve Outcome: Adequate for Discharge Goal: Ability to maintain a clear airway will improve Outcome: Adequate for Discharge   Problem: Education: Goal: Knowledge of General Education information will improve Description: Including pain rating scale, medication(s)/side effects and non-pharmacologic comfort measures Outcome: Adequate for Discharge   Problem: Health Behavior/Discharge Planning: Goal: Ability to manage health-related needs will improve Outcome: Adequate for Discharge   Problem: Clinical Measurements: Goal: Ability to maintain clinical measurements within normal limits will improve Outcome: Adequate for Discharge Goal: Will remain free from infection Outcome: Adequate for Discharge Goal: Diagnostic test results will improve Outcome: Adequate for  Discharge Goal: Respiratory complications will improve Outcome: Adequate for Discharge Goal: Cardiovascular complication will be avoided Outcome: Adequate for Discharge   Problem: Activity: Goal: Risk for activity intolerance will decrease Outcome: Adequate for Discharge   Problem: Nutrition: Goal: Adequate nutrition will be maintained Outcome: Adequate for Discharge   Problem: Coping: Goal: Level of anxiety will decrease Outcome: Adequate for Discharge   Problem: Elimination: Goal: Will not experience complications related to bowel motility Outcome: Adequate for Discharge Goal: Will not experience complications related to urinary retention Outcome: Adequate for Discharge   Problem: Pain Managment: Goal: General experience of comfort will improve Outcome: Adequate for Discharge   Problem: Safety: Goal: Ability to remain free from injury will improve Outcome: Adequate for Discharge   Problem: Skin Integrity: Goal: Risk for impaired skin integrity will decrease Outcome: Adequate for Discharge   Pt ao x4, respirations even and unlabored. Pt has all belongings.pt has received all DC instructions. Pt being picked up by daughter. Pt taken down to lobby via wheelchair with volunteer

## 2023-04-21 NOTE — Care Management Important Message (Signed)
Important Message  Patient Details  Name: Kristina Cruz MRN: [id number] Date of Birth: 1961-03-24   Medicare Important Message Given:  Yes     Johnell Comings 04/21/2023, 11:00 AM

## 2023-04-23 LAB — CULTURE, BLOOD (ROUTINE X 2)
Culture: NO GROWTH
Culture: NO GROWTH
Culture: NO GROWTH
Special Requests: ADEQUATE
Special Requests: ADEQUATE
Special Requests: ADEQUATE

## 2023-05-01 ENCOUNTER — Institutional Professional Consult (permissible substitution): Payer: 59 | Admitting: Pulmonary Disease

## 2023-05-05 ENCOUNTER — Encounter (INDEPENDENT_AMBULATORY_CARE_PROVIDER_SITE_OTHER): Payer: 59 | Admitting: Vascular Surgery

## 2023-05-20 ENCOUNTER — Institutional Professional Consult (permissible substitution): Payer: 59 | Admitting: Pulmonary Disease

## 2023-06-02 ENCOUNTER — Encounter (INDEPENDENT_AMBULATORY_CARE_PROVIDER_SITE_OTHER): Payer: 59 | Admitting: Vascular Surgery

## 2023-06-05 ENCOUNTER — Institutional Professional Consult (permissible substitution): Payer: 59 | Admitting: Pulmonary Disease

## 2023-06-18 ENCOUNTER — Institutional Professional Consult (permissible substitution): Payer: 59 | Admitting: Pulmonary Disease

## 2023-06-23 ENCOUNTER — Encounter (INDEPENDENT_AMBULATORY_CARE_PROVIDER_SITE_OTHER): Payer: 59 | Admitting: Vascular Surgery

## 2023-06-28 ENCOUNTER — Inpatient Hospital Stay
Admission: EM | Admit: 2023-06-28 | Discharge: 2023-06-30 | DRG: 202 | Disposition: A | Discharge status: Home / Self Care | Payer: 59 | Attending: Internal Medicine | Admitting: Internal Medicine

## 2023-06-28 ENCOUNTER — Inpatient Hospital Stay: Payer: 59

## 2023-06-28 ENCOUNTER — Emergency Department: Payer: 59

## 2023-06-28 ENCOUNTER — Other Ambulatory Visit: Payer: Self-pay

## 2023-06-28 DIAGNOSIS — J4542 Moderate persistent asthma with status asthmaticus: Principal | ICD-10-CM

## 2023-06-28 DIAGNOSIS — J9811 Atelectasis: Secondary | ICD-10-CM | POA: Diagnosis present

## 2023-06-28 DIAGNOSIS — J329 Chronic sinusitis, unspecified: Secondary | ICD-10-CM | POA: Insufficient documentation

## 2023-06-28 DIAGNOSIS — Z7985 Long-term (current) use of injectable non-insulin antidiabetic drugs: Secondary | ICD-10-CM

## 2023-06-28 DIAGNOSIS — J9601 Acute respiratory failure with hypoxia: Secondary | ICD-10-CM | POA: Diagnosis present

## 2023-06-28 DIAGNOSIS — J45901 Unspecified asthma with (acute) exacerbation: Secondary | ICD-10-CM | POA: Diagnosis not present

## 2023-06-28 DIAGNOSIS — E1165 Type 2 diabetes mellitus with hyperglycemia: Secondary | ICD-10-CM | POA: Diagnosis present

## 2023-06-28 DIAGNOSIS — Z91010 Allergy to peanuts: Secondary | ICD-10-CM | POA: Diagnosis not present

## 2023-06-28 DIAGNOSIS — E876 Hypokalemia: Secondary | ICD-10-CM | POA: Diagnosis present

## 2023-06-28 DIAGNOSIS — H9192 Unspecified hearing loss, left ear: Secondary | ICD-10-CM | POA: Diagnosis present

## 2023-06-28 DIAGNOSIS — Z8249 Family history of ischemic heart disease and other diseases of the circulatory system: Secondary | ICD-10-CM

## 2023-06-28 DIAGNOSIS — I4510 Unspecified right bundle-branch block: Secondary | ICD-10-CM | POA: Diagnosis present

## 2023-06-28 DIAGNOSIS — Z6841 Body Mass Index (BMI) 40.0 and over, adult: Secondary | ICD-10-CM

## 2023-06-28 DIAGNOSIS — Z8616 Personal history of COVID-19: Secondary | ICD-10-CM

## 2023-06-28 DIAGNOSIS — E119 Type 2 diabetes mellitus without complications: Secondary | ICD-10-CM

## 2023-06-28 DIAGNOSIS — I10 Essential (primary) hypertension: Secondary | ICD-10-CM | POA: Diagnosis present

## 2023-06-28 DIAGNOSIS — Z90710 Acquired absence of both cervix and uterus: Secondary | ICD-10-CM

## 2023-06-28 DIAGNOSIS — E66813 Obesity, class 3: Secondary | ICD-10-CM | POA: Diagnosis present

## 2023-06-28 DIAGNOSIS — G2581 Restless legs syndrome: Secondary | ICD-10-CM | POA: Diagnosis present

## 2023-06-28 DIAGNOSIS — Z961 Presence of intraocular lens: Secondary | ICD-10-CM | POA: Diagnosis present

## 2023-06-28 DIAGNOSIS — K219 Gastro-esophageal reflux disease without esophagitis: Secondary | ICD-10-CM | POA: Diagnosis present

## 2023-06-28 DIAGNOSIS — E1169 Type 2 diabetes mellitus with other specified complication: Secondary | ICD-10-CM | POA: Diagnosis not present

## 2023-06-28 DIAGNOSIS — J4541 Moderate persistent asthma with (acute) exacerbation: Principal | ICD-10-CM | POA: Diagnosis present

## 2023-06-28 DIAGNOSIS — Z7984 Long term (current) use of oral hypoglycemic drugs: Secondary | ICD-10-CM

## 2023-06-28 DIAGNOSIS — Z7951 Long term (current) use of inhaled steroids: Secondary | ICD-10-CM | POA: Diagnosis not present

## 2023-06-28 DIAGNOSIS — Z79899 Other long term (current) drug therapy: Secondary | ICD-10-CM

## 2023-06-28 DIAGNOSIS — E118 Type 2 diabetes mellitus with unspecified complications: Secondary | ICD-10-CM

## 2023-06-28 DIAGNOSIS — H7492 Unspecified disorder of left middle ear and mastoid: Secondary | ICD-10-CM

## 2023-06-28 DIAGNOSIS — Z1152 Encounter for screening for COVID-19: Secondary | ICD-10-CM | POA: Diagnosis not present

## 2023-06-28 LAB — CBC
HCT: 40.4 % (ref 36.0–46.0)
Hemoglobin: 12.9 g/dL (ref 12.0–15.0)
MCH: 28.3 pg (ref 26.0–34.0)
MCHC: 31.9 g/dL (ref 30.0–36.0)
MCV: 88.6 fL (ref 80.0–100.0)
Platelets: 384 10*3/uL (ref 150–400)
RBC: 4.56 MIL/uL (ref 3.87–5.11)
RDW: 14.6 % (ref 11.5–15.5)
WBC: 8.9 10*3/uL (ref 4.0–10.5)
nRBC: 0 % (ref 0.0–0.2)

## 2023-06-28 LAB — BASIC METABOLIC PANEL
Anion gap: 10 (ref 5–15)
BUN: 11 mg/dL (ref 8–23)
CO2: 26 mmol/L (ref 22–32)
Calcium: 8.7 mg/dL — ABNORMAL LOW (ref 8.9–10.3)
Chloride: 101 mmol/L (ref 98–111)
Creatinine, Ser: 0.88 mg/dL (ref 0.44–1.00)
GFR, Estimated: >60 mL/min (ref >60)
Glucose, Bld: 259 mg/dL — ABNORMAL HIGH (ref 70–99)
Potassium: 3.3 mmol/L — ABNORMAL LOW (ref 3.5–5.1)
Sodium: 137 mmol/L (ref 135–145)

## 2023-06-28 LAB — RESP PANEL BY RT-PCR (RSV, FLU A&B, COVID)  RVPGX2
Influenza A by PCR: NEGATIVE
Influenza B by PCR: NEGATIVE
Resp Syncytial Virus by PCR: NEGATIVE
SARS Coronavirus 2 by RT PCR: NEGATIVE

## 2023-06-28 LAB — GLUCOSE, CAPILLARY
Glucose-Capillary: 291 mg/dL — ABNORMAL HIGH (ref 70–99)
Glucose-Capillary: 268 mg/dL — ABNORMAL HIGH (ref 70–99)

## 2023-06-28 LAB — PROCALCITONIN: Procalcitonin: <0.1 ng/mL

## 2023-06-28 LAB — TROPONIN I (HIGH SENSITIVITY)
Troponin I (High Sensitivity): 6 ng/L (ref <18)
Troponin I (High Sensitivity): 10 ng/L (ref <18)

## 2023-06-28 LAB — D-DIMER, QUANTITATIVE: D-Dimer, Quant: 0.7 ug{FEU}/mL — ABNORMAL HIGH (ref 0.00–0.50)

## 2023-06-28 MED ORDER — INSULIN ASPART 100 UNIT/ML IJ SOLN
0.0000 [IU] | Freq: Three times a day (TID) | INTRAMUSCULAR | Status: DC
  Administered 2023-06-28 – 2023-06-29 (×2): 8 [IU] via SUBCUTANEOUS
  Administered 2023-06-29: 5 [IU] via SUBCUTANEOUS
  Administered 2023-06-29 – 2023-06-30 (×2): 3 [IU] via SUBCUTANEOUS
  Filled 2023-06-28 (×5): qty 1

## 2023-06-28 MED ORDER — ROPINIROLE HCL 1 MG PO TABS
2.0000 mg | ORAL_TABLET | Freq: Once | ORAL | Status: AC
  Administered 2023-06-28: 2 mg via ORAL
  Filled 2023-06-28: qty 2

## 2023-06-28 MED ORDER — IPRATROPIUM-ALBUTEROL 0.5-2.5 (3) MG/3ML IN SOLN
3.0000 mL | Freq: Once | RESPIRATORY_TRACT | Status: AC
  Administered 2023-06-28: 3 mL via RESPIRATORY_TRACT
  Filled 2023-06-28: qty 3

## 2023-06-28 MED ORDER — AMLODIPINE BESYLATE 5 MG PO TABS
5.0000 mg | ORAL_TABLET | Freq: Every day | ORAL | Status: DC
  Administered 2023-06-28 – 2023-06-30 (×3): 5 mg via ORAL
  Filled 2023-06-28 (×3): qty 1

## 2023-06-28 MED ORDER — INSULIN ASPART 100 UNIT/ML IJ SOLN
0.0000 [IU] | Freq: Every day | INTRAMUSCULAR | Status: DC
  Administered 2023-06-28: 3 [IU] via SUBCUTANEOUS
  Administered 2023-06-29: 2 [IU] via SUBCUTANEOUS
  Filled 2023-06-28 (×2): qty 1

## 2023-06-28 MED ORDER — ALBUTEROL SULFATE (2.5 MG/3ML) 0.083% IN NEBU
5.0000 mg | INHALATION_SOLUTION | Freq: Once | RESPIRATORY_TRACT | Status: AC
  Administered 2023-06-28: 5 mg via RESPIRATORY_TRACT
  Filled 2023-06-28: qty 6

## 2023-06-28 MED ORDER — FUROSEMIDE 40 MG PO TABS
40.0000 mg | ORAL_TABLET | Freq: Every day | ORAL | Status: DC
  Administered 2023-06-28 – 2023-06-30 (×3): 40 mg via ORAL
  Filled 2023-06-28 (×3): qty 1

## 2023-06-28 MED ORDER — ONDANSETRON HCL 4 MG PO TABS: 4.0000 mg | ORAL_TABLET | Freq: Four times a day (QID) | ORAL | Status: DC | PRN

## 2023-06-28 MED ORDER — INSULIN ASPART 100 UNIT/ML IJ SOLN
4.0000 [IU] | Freq: Three times a day (TID) | INTRAMUSCULAR | Status: DC
Start: 2023-06-28 — End: 2023-06-30
  Administered 2023-06-28 – 2023-06-29 (×4): 4 [IU] via SUBCUTANEOUS
  Filled 2023-06-28 (×4): qty 1

## 2023-06-28 MED ORDER — DOXYCYCLINE HYCLATE 100 MG IV SOLR
100.0000 mg | Freq: Two times a day (BID) | INTRAVENOUS | Status: DC
  Administered 2023-06-28 – 2023-06-29 (×3): 100 mg via INTRAVENOUS
  Filled 2023-06-28 (×4): qty 100

## 2023-06-28 MED ORDER — SODIUM CHLORIDE 0.9 % IV SOLN: INTRAVENOUS | Status: DC

## 2023-06-28 MED ORDER — IPRATROPIUM-ALBUTEROL 0.5-2.5 (3) MG/3ML IN SOLN
3.0000 mL | RESPIRATORY_TRACT | Status: DC | PRN
  Administered 2023-06-28 – 2023-06-29 (×6): 3 mL via RESPIRATORY_TRACT
  Filled 2023-06-28 (×9): qty 3

## 2023-06-28 MED ORDER — IOHEXOL 350 MG/ML SOLN
75.0000 mL | Freq: Once | INTRAVENOUS | Status: AC | PRN
  Administered 2023-06-28: 75 mL via INTRAVENOUS

## 2023-06-28 MED ORDER — METHYLPREDNISOLONE SODIUM SUCC 40 MG IJ SOLR
40.0000 mg | Freq: Two times a day (BID) | INTRAMUSCULAR | Status: AC
  Administered 2023-06-28 – 2023-06-29 (×2): 40 mg via INTRAVENOUS
  Filled 2023-06-28 (×2): qty 1

## 2023-06-28 MED ORDER — GUAIFENESIN-DM 100-10 MG/5ML PO SYRP
5.0000 mL | ORAL_SOLUTION | ORAL | Status: DC | PRN
  Administered 2023-06-28 – 2023-06-29 (×3): 5 mL via ORAL
  Filled 2023-06-28 (×3): qty 10

## 2023-06-28 MED ORDER — METFORMIN HCL 500 MG PO TABS
1000.0000 mg | ORAL_TABLET | Freq: Two times a day (BID) | ORAL | Status: DC
  Administered 2023-06-28 – 2023-06-30 (×5): 1000 mg via ORAL
  Filled 2023-06-28 (×5): qty 2

## 2023-06-28 MED ORDER — ROPINIROLE HCL 1 MG PO TABS
2.0000 mg | ORAL_TABLET | ORAL | Status: DC
  Filled 2023-06-28: qty 2

## 2023-06-28 MED ORDER — ONDANSETRON HCL 4 MG/2ML IJ SOLN
4.0000 mg | Freq: Four times a day (QID) | INTRAMUSCULAR | Status: DC | PRN
  Administered 2023-06-28 – 2023-06-29 (×2): 4 mg via INTRAVENOUS
  Filled 2023-06-28 (×2): qty 2

## 2023-06-28 MED ORDER — PREDNISONE 20 MG PO TABS
40.0000 mg | ORAL_TABLET | Freq: Every day | ORAL | Status: DC
  Administered 2023-06-29 – 2023-06-30 (×2): 40 mg via ORAL
  Filled 2023-06-28 (×2): qty 2

## 2023-06-28 MED ORDER — ENOXAPARIN SODIUM 80 MG/0.8ML IJ SOSY
65.0000 mg | PREFILLED_SYRINGE | INTRAMUSCULAR | Status: DC
  Administered 2023-06-28 – 2023-06-29 (×2): 65 mg via SUBCUTANEOUS
  Filled 2023-06-28 (×3): qty 0.65

## 2023-06-28 NOTE — Assessment & Plan Note (Signed)
Blood sugar in 250s Started on sliding scale insulin A1c Monitor blood sugars with steroid use

## 2023-06-28 NOTE — Assessment & Plan Note (Signed)
BP stable Titrate home regimen 

## 2023-06-28 NOTE — ED Triage Notes (Signed)
Pt in via [facility] c/o SOB. Pt has hx of asthma. Pt used albuterol inhaler this morning with no relief from symptoms. Pt is noted to have audible wheezing.   Vitals per EMS: 156-74 BS-226 Hr-84 100% on RA  Meds given Per EMS: 125mg  of solumedrole X1 duoneb 2g of mag in D5 X1 albuterol treatment

## 2023-06-28 NOTE — Plan of Care (Signed)
  Problem: Activity: Goal: Ability to tolerate increased activity will improve Outcome: Progressing   

## 2023-06-28 NOTE — H&P (Addendum)
History and Physical    Patient: Kristina Cruz WUJ:[id number] DOB: 06/04/1961 DOA: 06/28/2023 DOS: the patient was seen and examined on 06/28/2023 PCP: System, Provider Not In  Patient coming from: Home  Chief Complaint: No chief complaint on file.  HPI: Kristina Cruz is a 62 y.o. female with medical history significant of obesity, asthma, type 2 diabetes, hypertension, restless leg syndrome presented with acute respiratory failure hypoxia, asthma exacerbation, sinusitis.  Patient reports increased work of breathing with past 4 to 5 days.  Baseline history of asthma.  Patient noted to have been admitted September 2024 for similar issues with asthma exacerbation.  Patient reports having persistent nasal congestion and runny nose since this point.  Mild chills at home.  No chest pain.  No abdominal pain.  Has been using home inhalers with minimal improvement in symptoms.  No focal hemiparesis or confusion.  No reported tobacco use though remote use 30 to 40 years ago.  Minimal orthopnea and PND. Presented to the ER afebrile, hemodynamically stable.  Satting in low 90s on room air, transition to 2 L nasal cannula to keep O2 sats greater than 96%.  White count 8.9, hemoglobin 13, platelets 384, troponin negative x 2.  D-dimer 0.7.  COVID flu and RSV negative.  Creatinine 0.9.  Glucose 260.  Potassium 3.3. Review of Systems: As mentioned in the history of present illness. All other systems reviewed and are negative. Past Medical History:  Diagnosis Date   Anemia    Asthma    Back pain    Cataract    Diabetes mellitus without complication (HCC)    GERD (gastroesophageal reflux disease)    Hypertension    Morbid obesity with BMI of 60.0-69.9, adult (HCC)    Restless leg syndrome    Past Surgical History:  Procedure Laterality Date   ABDOMINAL HYSTERECTOMY     CATARACT EXTRACTION W/PHACO Left 04/13/2020   Procedure: CATARACT EXTRACTION PHACO AND INTRAOCULAR LENS PLACEMENT (IOC);   Surgeon: Elliot Cousin, MD;  Location: [facility] ORS;  Service: Ophthalmology;  Laterality: Left;  [place] 00:36.0 CDE 5.95 Fluid Pack lot # [id number]   HYSTEROSCOPY WITH D & C N/A 07/14/2017   Procedure: DILATATION AND CURETTAGE /HYSTEROSCOPY;  Surgeon: Nadara Mustard, MD;  Location: [facility] ORS;  Service: Gynecology;  Laterality: N/A;   POLYPECTOMY  2015   Social History:  reports that she has never smoked. She has never used smokeless tobacco. She reports that she does not drink alcohol and does not use drugs.  Allergies  Allergen Reactions   Peanut-Containing Drug Products     Family History  Problem Relation Age of Onset   Breast cancer Mother 61   Diabetes Mother    Hypertension Mother    Ovarian cancer Paternal Aunt        ?   Diabetes Father    Hypertension Father     Prior to Admission medications   Medication Sig Start Date End Date Taking? Authorizing Provider  acetaminophen (TYLENOL 8 HOUR ARTHRITIS PAIN) 650 MG CR tablet Take 3 tablets (1,950 mg total) by mouth every 8 (eight) hours as needed for pain. Do not exceed 4000 mg total in a day.  Home med. 04/21/23   Darlin Priestly, MD  amLODipine (NORVASC) 10 MG tablet Take 10 mg by mouth daily. 11/24/22   [provider]  atorvastatin (LIPITOR) 20 MG tablet Take 20 mg by mouth daily.    [provider]  baclofen (LIORESAL) 10 MG tablet Take 10 mg by mouth  3 (three) times daily.    [provider]  chlorpheniramine-HYDROcodone (TUSSIONEX) 10-8 MG/5ML Take 5 mLs by mouth every 12 (twelve) hours as needed for cough. 04/21/23   Darlin Priestly, MD  diphenhydrAMINE (BENADRYL) 50 MG tablet Take 1 tablet (50 mg total) by mouth every 6 (six) hours as needed for itching (swelling). 09/19/20   Shaune Pollack, MD  EPINEPHrine 0.3 mg/0.3 mL IJ SOAJ injection Inject 0.3 mg into the muscle as needed for anaphylaxis. 09/19/20   Shaune Pollack, MD  fluticasone furoate-vilanterol (BREO ELLIPTA) 100-25 MCG/ACT AEPB Inhale 1 puff into the lungs  daily. 02/13/23   Sunnie Nielsen, DO  furosemide (LASIX) 40 MG tablet Take 40 mg by mouth daily. 03/23/20   [provider]  gabapentin (NEURONTIN) 300 MG capsule Take 600 mg by mouth 2 (two) times daily.    [provider]  guaiFENesin (MUCINEX) 600 MG 12 hr tablet Take 1 tablet (600 mg total) by mouth 2 (two) times daily as needed for cough or to loosen phlegm. 02/13/23   Sunnie Nielsen, DO  ipratropium-albuterol (DUONEB) 0.5-2.5 (3) MG/3ML SOLN Take 3 mLs by nebulization every 4 (four) hours as needed (wheezing / shortness of breath). 02/13/23   Sunnie Nielsen, DO  melatonin 10 MG TABS Take 10 mg by mouth at bedtime as needed. 04/21/23   Darlin Priestly, MD  metFORMIN (GLUCOPHAGE) 1000 MG tablet Take 1,000 mg by mouth 2 (two) times daily. 11/27/22   [provider]  OZEMPIC, 0.25 OR 0.5 MG/DOSE, 2 MG/3ML SOPN Inject into the skin.    [provider]  rOPINIRole (REQUIP) 2 MG tablet Take 2 mg by mouth in the morning and at bedtime.     [provider]  VENTOLIN HFA 108 (90 Base) MCG/ACT inhaler Inhale 2 puffs into the lungs every 4 (four) hours as needed for wheezing or shortness of breath. 02/13/23   Sunnie Nielsen, DO    Physical Exam: Vitals:   06/28/23 0727 06/28/23 0800 06/28/23 0830 06/28/23 0900  BP: (!) 159/90 (!) 151/84 (!) 147/88   Pulse:  100 71 98  Resp:  13 16 14   Temp:      SpO2:  96% 93% 94%  Weight:      Height:       Physical Exam Constitutional:      Appearance: She is obese.  HENT:     Head: Normocephalic and atraumatic.     Nose: Nose normal.  Eyes:     Pupils: Pupils are equal, round, and reactive to light.  Cardiovascular:     Rate and Rhythm: Normal rate and regular rhythm.  Pulmonary:     Comments: + mild increased WOB + diffuse inspiratory/expiratory wheezes  Abdominal:     General: Bowel sounds are normal.  Musculoskeletal:        General: Normal range of motion.  Skin:    General: Skin is warm.   Neurological:     General: No focal deficit present.  Psychiatric:        Mood and Affect: Mood normal.     Data Reviewed:  There are no new results to review at this time.  DG Chest Port 1 View CLINICAL DATA:  Shortness of breath.  EXAM: PORTABLE CHEST 1 VIEW  COMPARISON:  April 18, 2023.  FINDINGS: Stable cardiomegaly.  Lungs are clear.  Bony thorax is unremarkable.  IMPRESSION: No active disease.  Electronically Signed   By: Lupita Raider M.D.   On: 06/28/2023 08:50  Lab  Results  Component Value Date   WBC 8.9 06/28/2023   HGB 12.9 06/28/2023   HCT 40.4 06/28/2023   MCV 88.6 06/28/2023   PLT 384 06/28/2023   Last metabolic panel Lab Results  Component Value Date   GLUCOSE 259 (H) 06/28/2023   NA 137 06/28/2023   K 3.3 (L) 06/28/2023   CL 101 06/28/2023   CO2 26 06/28/2023   BUN 11 06/28/2023   CREATININE 0.88 06/28/2023   GFRNONAA >60 06/28/2023   CALCIUM 8.7 (L) 06/28/2023   PHOS 3.2 02/13/2023   PROT 7.6 04/18/2023   ALBUMIN 3.8 04/18/2023   BILITOT 0.5 04/18/2023   ALKPHOS 86 04/18/2023   AST 12 (L) 04/18/2023   ALT 18 04/18/2023   ANIONGAP 10 06/28/2023    Assessment and Plan: * Acute respiratory failure with hypoxia (HCC) Decompensated respiratory status now requiring 2 L nasal cannula in setting of baseline asthma Chest x-ray within normal limits D-dimer mildly elevated at 0.7 Will get CTA of the chest to better assess (patient reports false reporting of contrast allergy-reports peanut allergy instead) IV Solu-Medrol DuoNebs Follow  Asthma exacerbation Worsening cough and wheezing over 1 to 2 weeks with noted asthma exacerbation September 2024 requiring admission Chest x-ray within normal limits Ruled out for formal COPD diagnosis during last admission IV Solu-Medrol DuoNebs Supplemental oxygen as needed Monitor  Type 2 diabetes mellitus (HCC) Blood sugar in 250s Started on sliding scale insulin A1c Monitor blood sugars  with steroid use  Sinusitis Recurrent nasal congestion and pain for the past 4 to 6 weeks Marked maxillary tenderness to palpation on exam with nasal congestion present Will order CT head to correlate  IV doxy for sinusitis coverage in the interim    Hypertension BP stable  Titrate home regimen    Restless leg syndrome Cont requip     Greater than 50% was spent in counseling and coordination of care with patient Total encounter time 80 minutes or more     Advance Care Planning:   Code Status: Full Code   Consults: None   Family Communication: No family at the bedside   Severity of Illness: The appropriate patient status for this patient is INPATIENT. Inpatient status is judged to be reasonable and necessary in order to provide the required intensity of service to ensure the patient's safety. The patient's presenting symptoms, physical exam findings, and initial radiographic and laboratory data in the context of their chronic comorbidities is felt to place them at high risk for further clinical deterioration. Furthermore, it is not anticipated that the patient will be medically stable for discharge from the hospital within 2 midnights of admission.   * I certify that at the point of admission it is my clinical judgment that the patient will require inpatient hospital care spanning beyond 2 midnights from the point of admission due to high intensity of service, high risk for further deterioration and high frequency of surveillance required.*  Author: Floydene Flock, MD 06/28/2023 12:46 PM  For on call review [email or web address].

## 2023-06-28 NOTE — Assessment & Plan Note (Signed)
Worsening cough and wheezing over 1 to 2 weeks with noted asthma exacerbation September 2024 requiring admission Chest x-ray within normal limits Ruled out for formal COPD diagnosis during last admission IV Solu-Medrol DuoNebs Supplemental oxygen as needed Monitor

## 2023-06-28 NOTE — ED Provider Notes (Signed)
[facility] Provider Note    Event Date/Time   First MD Initiated Contact with Patient 06/28/23 [phone]     (approximate)   History   Wheezing "Asthma" HPI  Kristina Cruz is a 62 y.o. female home on last discharge summary from September 3 as a history of diabetes asthma obesity respiratory failure.  Prior admission for acute respiratory failure and asthma exacerbation   Patient reports that since middle of October she has been having some cough felt like she had an infection or a viral infection that never got better.  Spoke with her daughter [name] as well, advises patient tested positive for COVID sometime around the middle of October and has never really fully recovered.  Patient has had to use albuterol frequently.  She has had increasing wheezing and shortness of breath.  She has a history of fairly significant asthma.  Today patient felt she needed to call EMS due to the degree of dyspnea.  EMS administered ipratropium, albuterol, magnesium, Solu-Medrol.  Patient continues to report feeling of sinus drainage, primarily nonproductive cough, and lots of wheezing.  She does still feel short of breath.  No chest pain.  No leg swelling or calf tenderness.  Denies any history of blood clots  Physical Exam   Triage Vital Signs: ED Triage Vitals  Encounter Vitals Group     BP 06/28/23 0727 (!) 159/90     Systolic BP Percentile --      Diastolic BP Percentile --      Pulse Rate 06/28/23 0724 94     Resp 06/28/23 0724 18     Temp 06/28/23 0724 98.1 F (36.7 C)     Temp src --      SpO2 06/28/23 0724 96 %     Weight 06/28/23 0720 282 lb (127.9 kg)     Height 06/28/23 0720 5\' 2"  (1.575 m)     Head Circumference --      Peak Flow --      Pain Score 06/28/23 0720 8     Pain Loc --      Pain Education --      Exclude from Growth Chart --     Most recent vital signs: Vitals:   06/28/23 0830 06/28/23 0900  BP: (!) 147/88   Pulse: 71 98  Resp: 16 14   Temp:    SpO2: 93% 94%     General: Awake, mild tachypnea and accessory muscle use.  Audible expiratory wheezing.  She appears to have mild respiratory distress.  Oxygen saturation 90% on room air, I placed her on 2 L nasal cannula and have ordered nebulizer treatments CV:  Good peripheral perfusion.  Normal tones and rate Resp:  Diffuse expiratory wheezing.  Mild accessory muscle use.  No fatigue or severe extremis or tripoding Abd:  No distention.  Other:  Trace bilateral lower extremity edema.  No calf tenderness venous cords or congestion   ED Results / Procedures / Treatments   Labs (all labs ordered are listed, but only abnormal results are displayed) Labs Reviewed  BASIC METABOLIC PANEL - Abnormal; Notable for the following components:      Result Value   Potassium 3.3 (*)    Glucose, Bld 259 (*)    Calcium 8.7 (*)    All other components within normal limits  D-DIMER, QUANTITATIVE (NOT AT [facility]) - Abnormal; Notable for the following components:   D-Dimer, Quant 0.70 (*)    All other components within normal  limits  RESP PANEL BY RT-PCR (RSV, FLU A&B, COVID)  RVPGX2  CBC  PROCALCITONIN  TROPONIN I (HIGH SENSITIVITY)  TROPONIN I (HIGH SENSITIVITY)     EKG  Interpreted by me at 8 AM heart rate 80 QRS 150 QTc 500 Normal sinus rhythm right bundle branch block.  QT prolongation likely related to bundle branch block   RADIOLOGY  Chest x-ray interpreted by me as negative for acute finding   PROCEDURES:  Critical Care performed: Yes, see critical care procedure note(s)  CRITICAL CARE Performed by: Sharyn Creamer   Total critical care time: 30 minutes  Critical care time was exclusive of separately billable procedures and treating other patients.  Critical care was necessary to treat or prevent imminent or life-threatening deterioration.  Critical care was time spent personally by me on the following activities: development of treatment plan with patient and/or  surrogate as well as nursing, discussions with consultants, evaluation of patient's response to treatment, examination of patient, obtaining history from patient or surrogate, ordering and performing treatments and interventions, ordering and review of laboratory studies, ordering and review of radiographic studies, pulse oximetry and re-evaluation of patient's condition.  Patient with respiratory distress requiring multiple nebulizer treatments  Procedures   MEDICATIONS ORDERED IN ED: Medications  metFORMIN (GLUCOPHAGE) tablet 1,000 mg (1,000 mg Oral Given 06/28/23 1038)  furosemide (LASIX) tablet 40 mg (40 mg Oral Given 06/28/23 1038)  amLODipine (NORVASC) tablet 5 mg (5 mg Oral Given 06/28/23 1038)  rOPINIRole (REQUIP) tablet 2 mg (has no administration in time range)  ipratropium-albuterol (DUONEB) 0.5-2.5 (3) MG/3ML nebulizer solution 3 mL (3 mLs Nebulization Given 06/28/23 0751)  ipratropium-albuterol (DUONEB) 0.5-2.5 (3) MG/3ML nebulizer solution 3 mL (3 mLs Nebulization Given 06/28/23 0751)  albuterol (PROVENTIL) (2.5 MG/3ML) 0.083% nebulizer solution 5 mg (5 mg Nebulization Given 06/28/23 1038)     IMPRESSION / MDM / ASSESSMENT AND PLAN / ED COURSE  I reviewed the triage vital signs and the nursing notes.                              Differential diagnosis includes, but is not limited to, asthma exacerbation, viral illness, pneumonia/superinfection after recent reported COVID infection, pneumothorax, less likely given his clinical picture symptomatology such as ACS (ECG pending, nurse aware), PE given previous history no pe or dvt and previous evaluation with reassuring VQ scan, dissection, anemia etc.  Patient is very pleasant.  Will start by providing additional DuoNebs, monitor closely.  EMS already started magnesium and Solu-Medrol.  Patient does have a history of prolonged asthma type exacerbations in the setting of viral illness.  D-dimer slightly positive, low pretest  probability PE.  Prior documented allergy to iodinated contrast.  Discussed with her hospitalist Dr. Alvester Morin, he will further evaluate as to neck step for rule out PE.  At this point lower extremity ultrasound is pending as well, if this was negative for DVTs to be somewhat reassuring but anticipate patient will likely undergo perfusion or potentially contrast allergy pretreatment with CTA.  Patient reports to me that she does not believe she has an allergy to the IV dye.  She reports that it was missed written as an Peanut allergy.  That in fact what she has is a peanut allergy that should be in her chart and not a contrast allergy.  Patient's presentation is most consistent with acute presentation with potential threat to life or bodily function.   The patient is  on the cardiac monitor to evaluate for evidence of arrhythmia and/or significant heart rate changes.    Clinical Course as of 06/28/23 1057  Sun Jun 28, 2023  1013 D-dimer is minimally elevated.  Will obtain venous ultrasound, she has iodinated contrast allergy.  Further workup will require either pretreatment with careful consideration of her allergy or perfusion study. [MQ]    Clinical Course User Index [MQ] Sharyn Creamer, MD   Labs interpreted as mild hypokalemia.  Mild hyperglycemia without evidence of DKA.  First troponin is normal.  Procalcitonin low reassuring against acute pulmonary bacterial infection.  CBC normal  ----------------------------------------- 10:15 AM on 06/28/2023 ----------------------------------------- Patient reassessment, continues with mild accessory muscle use and expiratory wheezing.  She does not appear to be having any worsening of condition but again slow to improve.  At this juncture we will give additional albuterol treatment she has already received steroids and magnesium.  She has a known history of asthma that has required hospitalization in the past.  Continue to follow her closely.  She shows no  evidence of fatigue or weakening and I do not think she needs noninvasive positive pressure ventilation, high flow nasal cannula or advanced airway management at this time but is appropriate for admission to the persistence of symptoms  Consulted with and patient accepted to hospitalist by Dr. Alvester Morin.  Attempted to update patient's daughter, [name], voicemail full  FINAL CLINICAL IMPRESSION(S) / ED DIAGNOSES   Final diagnoses:  Moderate persistent asthma with status asthmaticus     Rx / DC Orders   ED Discharge Orders     None        Note:  This document was prepared using Dragon voice recognition software and may include unintentional dictation errors.   Sharyn Creamer, MD 06/28/23 1057

## 2023-06-28 NOTE — ED Notes (Signed)
Pt called out for bathroom at this time. Pt uses walker at home. Walker provided. Gait steady. Stand by assist provided by this RN.

## 2023-06-28 NOTE — Progress Notes (Signed)
Patient arrived to unit via w/c in stable condition.

## 2023-06-28 NOTE — Progress Notes (Signed)
PHARMACIST - PHYSICIAN COMMUNICATION  CONCERNING:  Enoxaparin (Lovenox) for DVT Prophylaxis    RECOMMENDATION: Patient was prescribed enoxaprin 40mg  q24 hours for VTE prophylaxis.   Filed Weights   06/28/23 0720  Weight: 127.9 kg (282 lb)    Body mass index is 51.58 kg/m.  Estimated Creatinine Clearance: 82.8 mL/min (by C-G formula based on SCr of 0.88 mg/dL).  Based on [facility] policy patient is candidate for enoxaparin 0.5mg /kg TBW SQ every 24 hours based on BMI being >30.  DESCRIPTION: Pharmacy has adjusted enoxaparin dose per [facility] policy.  Patient is now receiving enoxaparin 65 mg every 24 hours   Effie Shy, PharmD Pharmacy Resident  06/28/2023 12:25 PM

## 2023-06-28 NOTE — Assessment & Plan Note (Signed)
Recurrent nasal congestion and pain for the past 4 to 6 weeks Marked maxillary tenderness to palpation on exam with nasal congestion present Will order CT head to correlate  IV doxy for sinusitis coverage in the interim

## 2023-06-28 NOTE — Assessment & Plan Note (Signed)
Con't requip  

## 2023-06-28 NOTE — Assessment & Plan Note (Addendum)
Decompensated respiratory status now requiring 2 L nasal cannula in setting of baseline asthma Chest x-ray within normal limits D-dimer mildly elevated at 0.7 Will get CTA of the chest to better assess (patient reports false reporting of contrast allergy-reports peanut allergy instead) IV Solu-Medrol DuoNebs Follow

## 2023-06-29 DIAGNOSIS — J4541 Moderate persistent asthma with (acute) exacerbation: Secondary | ICD-10-CM | POA: Diagnosis not present

## 2023-06-29 LAB — CBC
HCT: 40.9 % (ref 36.0–46.0)
Hemoglobin: 13.1 g/dL (ref 12.0–15.0)
MCH: 27.9 pg (ref 26.0–34.0)
MCHC: 32 g/dL (ref 30.0–36.0)
MCV: 87 fL (ref 80.0–100.0)
Platelets: 421 10*3/uL — ABNORMAL HIGH (ref 150–400)
RBC: 4.7 MIL/uL (ref 3.87–5.11)
RDW: 14.6 % (ref 11.5–15.5)
WBC: 9.4 10*3/uL (ref 4.0–10.5)
nRBC: 0 % (ref 0.0–0.2)

## 2023-06-29 LAB — COMPREHENSIVE METABOLIC PANEL
ALT: 17 U/L (ref 0–44)
AST: 14 U/L — ABNORMAL LOW (ref 15–41)
Albumin: 3.8 g/dL (ref 3.5–5.0)
Alkaline Phosphatase: 66 U/L (ref 38–126)
Anion gap: 9 (ref 5–15)
BUN: 16 mg/dL (ref 8–23)
CO2: 26 mmol/L (ref 22–32)
Calcium: 9.1 mg/dL (ref 8.9–10.3)
Chloride: 101 mmol/L (ref 98–111)
Creatinine, Ser: 0.86 mg/dL (ref 0.44–1.00)
GFR, Estimated: >60 mL/min (ref >60)
Glucose, Bld: 285 mg/dL — ABNORMAL HIGH (ref 70–99)
Potassium: 4.2 mmol/L (ref 3.5–5.1)
Sodium: 136 mmol/L (ref 135–145)
Total Bilirubin: 0.3 mg/dL (ref <1.2)
Total Protein: 7.6 g/dL (ref 6.5–8.1)

## 2023-06-29 LAB — GLUCOSE, CAPILLARY
Glucose-Capillary: 287 mg/dL — ABNORMAL HIGH (ref 70–99)
Glucose-Capillary: 210 mg/dL — ABNORMAL HIGH (ref 70–99)
Glucose-Capillary: 188 mg/dL — ABNORMAL HIGH (ref 70–99)
Glucose-Capillary: 267 mg/dL — ABNORMAL HIGH (ref 70–99)
Glucose-Capillary: 223 mg/dL — ABNORMAL HIGH (ref 70–99)

## 2023-06-29 MED ORDER — DOXYCYCLINE HYCLATE 100 MG PO TABS: 100.0000 mg | ORAL_TABLET | Freq: Two times a day (BID) | ORAL | Status: DC

## 2023-06-29 MED ORDER — ACETAMINOPHEN 325 MG PO TABS
650.0000 mg | ORAL_TABLET | Freq: Once | ORAL | Status: AC
  Administered 2023-06-29: 650 mg via ORAL
  Filled 2023-06-29: qty 2

## 2023-06-29 MED ORDER — HYDROCOD POLI-CHLORPHE POLI ER 10-8 MG/5ML PO SUER
5.0000 mL | Freq: Two times a day (BID) | ORAL | Status: DC | PRN
  Administered 2023-06-29 – 2023-06-30 (×2): 5 mL via ORAL
  Filled 2023-06-29 (×2): qty 5

## 2023-06-29 MED ORDER — ACETAMINOPHEN 325 MG PO TABS
650.0000 mg | ORAL_TABLET | Freq: Four times a day (QID) | ORAL | Status: DC | PRN
  Administered 2023-06-29: 650 mg via ORAL
  Filled 2023-06-29: qty 2

## 2023-06-29 MED ORDER — DOXYCYCLINE HYCLATE 100 MG PO TABS
100.0000 mg | ORAL_TABLET | Freq: Two times a day (BID) | ORAL | Status: DC
  Administered 2023-06-29 – 2023-06-30 (×2): 100 mg via ORAL
  Filled 2023-06-29 (×2): qty 1

## 2023-06-29 MED ORDER — MELATONIN 5 MG PO TABS: 10.0000 mg | ORAL_TABLET | Freq: Every evening | ORAL | Status: DC | PRN

## 2023-06-29 MED ORDER — SALINE SPRAY 0.65 % NA SOLN
1.0000 | NASAL | Status: DC | PRN
  Administered 2023-06-29: 1 via NASAL
  Filled 2023-06-29: qty 44

## 2023-06-29 MED ORDER — ROPINIROLE HCL 1 MG PO TABS
2.0000 mg | ORAL_TABLET | Freq: Two times a day (BID) | ORAL | Status: DC
  Administered 2023-06-29 – 2023-06-30 (×3): 2 mg via ORAL
  Filled 2023-06-29 (×3): qty 2

## 2023-06-29 NOTE — Progress Notes (Signed)
PHARMACIST - PHYSICIAN COMMUNICATION DR:   Myriam Forehand CONCERNING: Antibiotic IV to Oral Route Change Policy  RECOMMENDATION: This patient is receiving doxycycline by the intravenous route.  Based on criteria approved by the Pharmacy and Therapeutics Committee, the antibiotic(s) is/are being converted to the equivalent oral dose form(s).   DESCRIPTION: These criteria include: Patient being treated for a respiratory tract infection, urinary tract infection, cellulitis or clostridium difficile associated diarrhea if on metronidazole The patient is not neutropenic and does not exhibit a GI malabsorption state The patient is eating (either orally or via tube) and/or has been taking other orally administered medications for a least 24 hours The patient is improving clinically and has a Tmax < 100.5    Elliot Gurney, PharmD, BCPS Clinical Pharmacist  06/29/2023 1:41 PM

## 2023-06-29 NOTE — Progress Notes (Addendum)
Progress Note    Kristina Cruz  EXB:[id number] DOB: 1961-03-03  DOA: 06/28/2023 PCP: System, Provider Not In      Brief Narrative:    Medical records reviewed and are as summarized below:  Kristina Cruz is a 62 y.o. female with medical history significant for morbid obesity, asthma, type II DM, hypertension, restless leg syndrome, recent discharge from the hospital in September 2024 for asthma exacerbation complicated by acute hypoxemic respiratory failure.  She presented to the hospital because of productive cough, wheezing, pleuritic chest pain and vomiting.  She said since she was last discharged from the hospital, she has been having flulike symptoms with nasal congestion and runny nose.  She was previously taking Tussionex cough syrup which helped but she ran out of it.  She was admitted to the hospital for acute eczema exacerbation.        Assessment/Plan:   Principal Problem:   Asthma exacerbation Active Problems:   Restless leg syndrome   Hypertension   Sinusitis   Type 2 diabetes mellitus (HCC)    Body mass index is 53.1 kg/m.  (Morbid obesity)    Moderate persistent asthma with acute exacerbation: Continue prednisone and bronchodilators.  Continue Tussionex cough syrup. No documented hypoxia.  CTA was negative for acute pulmonary embolism.   Right middle lobe atelectasis: Incentive spirometer as needed   Pamsinus mucosal thickening with moderate left mastoid effusion: Continue doxycycline for suspected acute sinusitis.  Left mastoid effusion was present on MRI brain in March 2023.  Outpatient follow-up with ENT.   Type II DM with hyperglycemia: NovoLog as needed for hyperglycemia.  Hemoglobin A1c was 8.0 in June 2024.   Comorbidities include hypertension, restless leg syndrome   Diet Order             Diet heart healthy/carb modified Room service appropriate? Yes; Fluid consistency: Thin  Diet effective now                             Consultants: None  Procedures: None    Medications:    amLODipine  5 mg Oral Daily   enoxaparin (LOVENOX) injection  65 mg Subcutaneous Q24H   furosemide  40 mg Oral Daily   insulin aspart  0-15 Units Subcutaneous TID WC   insulin aspart  0-5 Units Subcutaneous QHS   insulin aspart  4 Units Subcutaneous TID WC   metFORMIN  1,000 mg Oral BID   predniSONE  40 mg Oral Q breakfast   rOPINIRole  2 mg Oral BID   Continuous Infusions:  doxycycline (VIBRAMYCIN) IV 100 mg (06/29/23 0135)     Anti-infectives (From admission, onward)    Start     Dose/Rate Route Frequency Ordered Stop   06/28/23 1330  doxycycline (VIBRAMYCIN) 100 mg in dextrose 5 % 250 mL IVPB        100 mg 125 mL/hr over 120 Minutes Intravenous Every 12 hours 06/28/23 1239                Family Communication/Anticipated D/C date and plan/Code Status   DVT prophylaxis: Place TED hose Start: 06/28/23 1215     Code Status: Full Code  Family Communication: None Disposition Plan: Plan to discharge home   Status is: Inpatient Remains inpatient appropriate because: Asthma exacerbation       Subjective:   Interval events noted.  She complains of productive cough, pleuritic chest pain from coughing and wheezing.  Objective:  Vitals:   06/28/23 1933 06/29/23 0454 06/29/23 0747 06/29/23 1235  BP: (!) 160/72 (!) 167/83 (!) 142/83   Pulse: 90 92 92   Resp: 18 20    Temp: 97.8 F (36.6 C) 98.2 F (36.8 C) 98.2 F (36.8 C)   TempSrc: Oral Oral    SpO2: 97% 97% 97%   Weight:    131.7 kg  Height:       No data found.   Intake/Output Summary (Last 24 hours) at 06/29/2023 1257 Last data filed at 06/29/2023 0400 Gross per 24 hour  Intake 1166.12 ml  Output --  Net 1166.12 ml   Filed Weights   06/28/23 0720 06/28/23 1500 06/29/23 1235  Weight: 127.9 kg 59.6 kg 131.7 kg    Exam:   GEN: NAD SKIN: Warm and dry EYES: No pallor or icterus ENT: MMM CV:  RRR PULM: Decreased air entry bilaterally, diffuse expiratory wheezing ABD: soft, obese, NT, +BS CNS: AAO x 3, non focal EXT: No edema or tenderness       Data Reviewed:   I have personally reviewed following labs and imaging studies:  Labs: Labs show the following:   Basic Metabolic Panel: Recent Labs  Lab 06/28/23 0726 06/29/23 0327  NA 137 136  K 3.3* 4.2  CL 101 101  CO2 26 26  GLUCOSE 259* 285*  BUN 11 16  CREATININE 0.88 0.86  CALCIUM 8.7* 9.1   GFR Estimated Creatinine Clearance: 86.3 mL/min (by C-G formula based on SCr of 0.86 mg/dL). Liver Function Tests: Recent Labs  Lab 06/29/23 0327  AST 14*  ALT 17  ALKPHOS 66  BILITOT 0.3  PROT 7.6  ALBUMIN 3.8   No results for input(s): "LIPASE", "AMYLASE" in the last 168 hours. No results for input(s): "AMMONIA" in the last 168 hours. Coagulation profile No results for input(s): "INR", "PROTIME" in the last 168 hours.  CBC: Recent Labs  Lab 06/28/23 0726 06/29/23 0327  WBC 8.9 9.4  HGB 12.9 13.1  HCT 40.4 40.9  MCV 88.6 87.0  PLT 384 421*   Cardiac Enzymes: No results for input(s): "CKTOTAL", "CKMB", "CKMBINDEX", "TROPONINI" in the last 168 hours. BNP (last 3 results) No results for input(s): "PROBNP" in the last 8760 hours. CBG: Recent Labs  Lab 06/28/23 1532 06/28/23 1954 06/29/23 0754 06/29/23 1118  GLUCAP 291* 268* 287* 210*   D-Dimer: Recent Labs    06/28/23 0756  DDIMER 0.70*   Hgb A1c: No results for input(s): "HGBA1C" in the last 72 hours. Lipid Profile: No results for input(s): "CHOL", "HDL", "LDLCALC", "TRIG", "CHOLHDL", "LDLDIRECT" in the last 72 hours. Thyroid function studies: No results for input(s): "TSH", "T4TOTAL", "T3FREE", "THYROIDAB" in the last 72 hours.  Invalid input(s): "FREET3" Anemia work up: No results for input(s): "VITAMINB12", "FOLATE", "FERRITIN", "TIBC", "IRON", "RETICCTPCT" in the last 72 hours. Sepsis Labs: Recent Labs  Lab 06/28/23 0726  06/29/23 0327  PROCALCITON <0.10  --   WBC 8.9 9.4    Microbiology Recent Results (from the past 240 hour(s))  Resp panel by RT-PCR (RSV, Flu A&B, Covid) Anterior Nasal Swab     Status: None   Collection Time: 06/28/23  7:50 AM   Specimen: Anterior Nasal Swab  Result Value Ref Range Status   SARS Coronavirus 2 by RT PCR NEGATIVE NEGATIVE Final    Comment: (NOTE) SARS-CoV-2 target nucleic acids are NOT DETECTED.  The SARS-CoV-2 RNA is generally detectable in upper respiratory specimens during the acute phase of infection. The lowest concentration of  SARS-CoV-2 viral copies this assay can detect is 138 copies/mL. A negative result does not preclude SARS-Cov-2 infection and should not be used as the sole basis for treatment or other patient management decisions. A negative result may occur with  improper specimen collection/handling, submission of specimen other than nasopharyngeal swab, presence of viral mutation(s) within the areas targeted by this assay, and inadequate number of viral copies(<138 copies/mL). A negative result must be combined with clinical observations, patient history, and epidemiological information. The expected result is Negative.  Fact Sheet for Patients:  BloggerCourse.com  Fact Sheet for Healthcare Providers:  SeriousBroker.it  This test is no t yet approved or cleared by the [place] FDA and  has been authorized for detection and/or diagnosis of SARS-CoV-2 by FDA under an Emergency Use Authorization (EUA). This EUA will remain  in effect (meaning this test can be used) for the duration of the COVID-19 declaration under Section 564(b)(1) of the Act, 21 U.S.C.section 360bbb-3(b)(1), unless the authorization is terminated  or revoked sooner.       Influenza A by PCR NEGATIVE NEGATIVE Final   Influenza B by PCR NEGATIVE NEGATIVE Final    Comment: (NOTE) The Xpert Xpress SARS-CoV-2/FLU/RSV plus  assay is intended as an aid in the diagnosis of influenza from Nasopharyngeal swab specimens and should not be used as a sole basis for treatment. Nasal washings and aspirates are unacceptable for Xpert Xpress SARS-CoV-2/FLU/RSV testing.  Fact Sheet for Patients: BloggerCourse.com  Fact Sheet for Healthcare Providers: SeriousBroker.it  This test is not yet approved or cleared by the [place] FDA and has been authorized for detection and/or diagnosis of SARS-CoV-2 by FDA under an Emergency Use Authorization (EUA). This EUA will remain in effect (meaning this test can be used) for the duration of the COVID-19 declaration under Section 564(b)(1) of the Act, 21 U.S.C. section 360bbb-3(b)(1), unless the authorization is terminated or revoked.     Resp Syncytial Virus by PCR NEGATIVE NEGATIVE Final    Comment: (NOTE) Fact Sheet for Patients: BloggerCourse.com  Fact Sheet for Healthcare Providers: SeriousBroker.it  This test is not yet approved or cleared by the [place] FDA and has been authorized for detection and/or diagnosis of SARS-CoV-2 by FDA under an Emergency Use Authorization (EUA). This EUA will remain in effect (meaning this test can be used) for the duration of the COVID-19 declaration under Section 564(b)(1) of the Act, 21 U.S.C. section 360bbb-3(b)(1), unless the authorization is terminated or revoked.  Performed at [facility], [address] [place], [place] [address]     Procedures and diagnostic studies:  CT Angio Chest Pulmonary Embolism (PE) W or WO Contrast  Result Date: 06/28/2023 CLINICAL DATA:  Shortness of breath, concern for pulmonary embolism. EXAM: CT ANGIOGRAPHY CHEST WITH CONTRAST TECHNIQUE: Multidetector CT imaging of the chest was performed using the standard protocol during bolus administration of intravenous contrast.  Multiplanar CT image reconstructions and MIPs were obtained to evaluate the vascular anatomy. RADIATION DOSE REDUCTION: This exam was performed according to the departmental dose-optimization program which includes automated exposure control, adjustment of the mA and/or kV according to patient size and/or use of iterative reconstruction technique. CONTRAST:  75mL OMNIPAQUE IOHEXOL 350 MG/ML SOLN COMPARISON:  Same day chest radiograph and CT chest dated 02/16/2015. FINDINGS: Cardiovascular: Satisfactory opacification of the pulmonary arteries to the segmental level. No evidence of pulmonary embolism. The heart is mildly enlarged. No pericardial effusion. Mediastinum/Nodes: No enlarged mediastinal, hilar, or axillary lymph nodes. Thyroid gland, trachea, and esophagus  demonstrate no significant findings. Lungs/Pleura: There is near complete atelectasis of the right middle lobe. There is mild atelectasis of the lingula. No pleural effusion or pneumothorax. Upper Abdomen: No acute abnormality. Musculoskeletal: Degenerative changes are seen in the spine. Review of the MIP images confirms the above findings. IMPRESSION: 1. No evidence of pulmonary embolism. 2. Near complete atelectasis of the right middle lobe. Electronically Signed   By: Romona Curls M.D.   On: 06/28/2023 14:15   CT HEAD WO CONTRAST ( )  Result Date: 06/28/2023 CLINICAL DATA:  Headache, increasing frequency or severity EXAM: CT HEAD WITHOUT CONTRAST TECHNIQUE: Contiguous axial images were obtained from the base of the skull through the vertex without intravenous contrast. RADIATION DOSE REDUCTION: This exam was performed according to the departmental dose-optimization program which includes automated exposure control, adjustment of the mA and/or kV according to patient size and/or use of iterative reconstruction technique. COMPARISON:  CT Head 11/07/21 FINDINGS: Brain: No hemorrhage. No hydrocephalus. No extra-axial fluid collection. No CT evidence  of an cortical infarct. No mass effect. No mass lesion. There is sequela of mild overall chronic microvascular ischemic change. There are nonspecific speckled calcifications in the central pons, unchanged from prior Vascular: No hyperdense vessel or unexpected calcification. Skull: Normal. Negative for fracture or focal lesion. Sinuses/Orbits: No middle ear effusion. There is a moderate left-sided mastoid effusion. Pansinus mucosal thickening. Left lens replacement. Orbits are otherwise unremarkable. Other: None. IMPRESSION: Pansinus mucosal thickening with moderate left-sided mastoid effusion. No other CT etiology for headaches identified Electronically Signed   By: Lorenza Cambridge M.D.   On: 06/28/2023 14:10   US Venous Img Lower Bilateral  Result Date: 06/28/2023 CLINICAL DATA:  Dyspnea.  Lower extremity pain. EXAM: BILATERAL LOWER EXTREMITY VENOUS DOPPLER ULTRASOUND TECHNIQUE: Gray-scale sonography with compression, as well as color and duplex ultrasound, were performed to evaluate the deep venous system(s) from the level of the common femoral vein through the popliteal and proximal calf veins. COMPARISON:  None Available. FINDINGS: VENOUS Normal compressibility of the common femoral, superficial femoral, and popliteal veins, as well as the visualized calf veins. Visualized portions of profunda femoral vein and great saphenous vein unremarkable. No filling defects to suggest DVT on grayscale or color Doppler imaging. Doppler waveforms show normal direction of venous flow, normal respiratory plasticity and response to augmentation. Limited views of the contralateral common femoral vein are unremarkable. OTHER None. Limitations: none IMPRESSION: Negative. Electronically Signed   By: Kennith Center M.D.   On: 06/28/2023 13:09   DG Chest Port 1 View  Result Date: 06/28/2023 CLINICAL DATA:  Shortness of breath. EXAM: PORTABLE CHEST 1 VIEW COMPARISON:  April 18, 2023. FINDINGS: Stable cardiomegaly.  Lungs are  clear.  Bony thorax is unremarkable. IMPRESSION: No active disease. Electronically Signed   By: Lupita Raider M.D.   On: 06/28/2023 08:50               LOS: 1 day   Aubryana Vittorio  [facility]   Pager on [email or web address]. If 7PM-7AM, please contact night-coverage at [email or web address]     06/29/2023, 12:57 PM

## 2023-06-29 NOTE — Inpatient Diabetes Management (Signed)
Inpatient Diabetes Program Recommendations  AACE/ADA: New Consensus Statement on Inpatient Glycemic Control (2015)  Target Ranges:  Prepandial:   less than 140 mg/dL      Peak postprandial:   less than 180 mg/dL (1-2 hours)      Critically ill patients:  140 - 180 mg/dL   Lab Results  Component Value Date   GLUCAP 287 (H) 06/29/2023   HGBA1C 8.0 (H) 02/10/2023    Review of Glycemic Control  Diabetes history: DM 2 Outpatient Diabetes medications: Metformin 1000 mg bid, Ozempic Current orders for Inpatient glycemic control:  Novolog 0-15 units tid + hs Novolog 4 units tid meal coverage if eating >50% of meals Metformin 1000 mg bid  Inpatient Diabetes Program Recommendations:    Note: Solumedrol transitioned to PO Prednisone   -   Increase Novolog meal coverage to 6 units tid if eating >50% of meals -   Start Semglee 8 units   Thanks,  Christena Deem RN, MSN, BC-ADM Inpatient Diabetes Coordinator Team Pager [phone] (8a-5p)

## 2023-06-30 DIAGNOSIS — J4541 Moderate persistent asthma with (acute) exacerbation: Secondary | ICD-10-CM | POA: Diagnosis not present

## 2023-06-30 DIAGNOSIS — H7492 Unspecified disorder of left middle ear and mastoid: Secondary | ICD-10-CM

## 2023-06-30 LAB — GLUCOSE, CAPILLARY: Glucose-Capillary: 181 mg/dL — ABNORMAL HIGH (ref 70–99)

## 2023-06-30 MED ORDER — HYDROCOD POLI-CHLORPHE POLI ER 10-8 MG/5ML PO SUER: 5.0000 mL | Freq: Two times a day (BID) | ORAL | 0 refills | Status: AC | PRN

## 2023-06-30 MED ORDER — PREDNISONE 20 MG PO TABS: 40.0000 mg | ORAL_TABLET | Freq: Every day | ORAL | 0 refills | Status: AC

## 2023-06-30 MED ORDER — SALINE SPRAY 0.65 % NA SOLN: 1.0000 | NASAL | Status: DC | PRN

## 2023-06-30 MED ORDER — INSULIN ASPART 100 UNIT/ML IJ SOLN
8.0000 [IU] | Freq: Three times a day (TID) | INTRAMUSCULAR | Status: DC
  Administered 2023-06-30: 8 [IU] via SUBCUTANEOUS
  Filled 2023-06-30: qty 1

## 2023-06-30 NOTE — Discharge Summary (Signed)
Physician Discharge Summary   Patient: Kristina Cruz MRN: [id number] DOB: 1961/08/02  Admit date:     06/28/2023  Discharge date: 06/30/23  Discharge Physician: Lurene Shadow   PCP: System, Provider Not In   Recommendations at discharge:   Follow-up with PCP in 1 week Follow-up with [facility] in 2 weeks  Discharge Diagnoses: Principal Problem:   Asthma exacerbation Active Problems:   Morbid obesity (HCC)   Restless leg syndrome   Hypertension   Sinusitis   Type 2 diabetes mellitus (HCC)   Mastoid effusion, left  Resolved Problems:   * No resolved hospital problems. *  Hospital Course:   Kristina Cruz is a 62 y.o. female with medical history significant for morbid obesity, asthma, type II DM, hypertension, restless leg syndrome, recent discharge from the hospital in September 2024 for asthma exacerbation complicated by acute hypoxemic respiratory failure.  She presented to the hospital because of productive cough, wheezing, pleuritic chest pain and vomiting.  She said since she was last discharged from the hospital, she has been having flulike symptoms with nasal congestion and runny nose.  She was previously taking Tussionex cough syrup which helped but she ran out of it.   She was admitted to the hospital for acute eczema exacerbation.    Assessment and Plan:   Moderate persistent asthma with acute exacerbation: Improved.  Continue bronchodilators.  She will be discharged on prednisone to complete 5 days of steroids.  She also requested a prescription for Tussionex cough syrup.   CTA was negative for acute pulmonary embolism.     Right middle lobe atelectasis: She is tolerating room air.     Pamsinus mucosal thickening with moderate left mastoid effusion: Continue saline nasal spray.  Doxycycline will be discontinued at discharge. Left mastoid effusion was present on MRI brain in March 2023. 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  She has never seen an ENT physician though she follows at [facility] eye clinic. Outpatient follow-up with ENT.     Type II DM with hyperglycemia: Resume Ozempic and metformin at discharge.  Hemoglobin A1c was 8.0 in June 2024.     Comorbidities include hypertension, restless leg syndrome    Her condition is improved and she is deemed stable for discharge to home today.     Pain control - [employer] Controlled Substance Reporting System database was reviewed. and patient was instructed, not to drive, operate heavy machinery, perform activities at heights, swimming or participation in water activities or provide baby-sitting services while on Pain, Sleep and Anxiety Medications; until their outpatient Physician has advised to do so again. Also recommended to not to take more than prescribed Pain, Sleep and Anxiety Medications.  Consultants: None Procedures performed: None  Disposition: Home Diet recommendation:  Discharge Diet Orders (From admission, onward)     Start     Ordered   06/30/23 0000  Diet - low sodium heart healthy        06/30/23 1000   06/30/23 0000  Diet Carb Modified        06/30/23 1000           Cardiac and Carb modified diet DISCHARGE MEDICATION: Allergies as of 06/30/2023       Reactions   Peanut-containing Drug Products         Medication List     TAKE these medications    acetaminophen 650 MG CR tablet Commonly known as: Tylenol 8 Hour  Arthritis Pain Take 3 tablets (1,950 mg total) by mouth every 8 (eight) hours as needed for pain. Do not exceed 4000 mg total in a day.  Home med.   amLODipine 10 MG tablet Commonly known as: NORVASC Take 10 mg by mouth daily.   atorvastatin 20 MG tablet Commonly known as: LIPITOR Take 20 mg by mouth daily.   baclofen 10 MG tablet Commonly known as: LIORESAL Take 10 mg by mouth 3 (three) times daily.   chlorpheniramine-HYDROcodone 10-8 MG/5ML Commonly known as: TUSSIONEX Take 5 mLs by  mouth every 12 (twelve) hours as needed for up to 5 days for cough.   diphenhydrAMINE 50 MG tablet Commonly known as: BENADRYL Take 1 tablet (50 mg total) by mouth every 6 (six) hours as needed for itching (swelling).   EPINEPHrine 0.3 mg/0.3 mL Soaj injection Commonly known as: EPI-PEN Inject 0.3 mg into the muscle as needed for anaphylaxis.   fluticasone furoate-vilanterol 100-25 MCG/ACT Aepb Commonly known as: Breo Ellipta Inhale 1 puff into the lungs daily.   furosemide 40 MG tablet Commonly known as: LASIX Take 40 mg by mouth daily.   gabapentin 300 MG capsule Commonly known as: NEURONTIN Take 600 mg by mouth 2 (two) times daily.   guaiFENesin 600 MG 12 hr tablet Commonly known as: MUCINEX Take 1 tablet (600 mg total) by mouth 2 (two) times daily as needed for cough or to loosen phlegm.   ipratropium-albuterol 0.5-2.5 (3) MG/3ML Soln Commonly known as: DUONEB Take 3 mLs by nebulization every 4 (four) hours as needed (wheezing / shortness of breath).   Melatonin 10 MG Tabs Take 10 mg by mouth at bedtime as needed.   metFORMIN 1000 MG tablet Commonly known as: GLUCOPHAGE Take 1,000 mg by mouth 2 (two) times daily.   Ozempic (0.25 or 0.5 MG/DOSE) 2 MG/3ML Sopn Generic drug: Semaglutide(0.25 or 0.5MG /DOS) Inject into the skin.   predniSONE 20 MG tablet Commonly known as: DELTASONE Take 2 tablets (40 mg total) by mouth daily with breakfast for 2 days. Start taking on: July 01, 2023   rOPINIRole 2 MG tablet Commonly known as: REQUIP Take 2 mg by mouth in the morning and at bedtime.   Ventolin HFA 108 (90 Base) MCG/ACT inhaler Generic drug: albuterol Inhale 2 puffs into the lungs every 4 (four) hours as needed for wheezing or shortness of breath.        Follow-up Information     [facility]. Schedule an appointment as soon as possible for a visit in 2 week(s).   Why: left mastoid effusion, left ear deafness Contact information: [address] [place] [address] [phone]               Discharge Exam: Filed Weights   06/28/23 0720 06/28/23 1500 06/29/23 1235  Weight: 127.9 kg 59.6 kg 131.7 kg   GEN: NAD SKIN: Warm and dry EYES: No pallor or icterus ENT: MMM CV: RRR PULM: Improved air entry bilaterally.  No wheezing or rales heard ABD: soft, obese, NT, +BS CNS: AAO x 3, non focal EXT: No edema or tenderness   Condition at discharge: good  The results of significant diagnostics from this hospitalization (including imaging, microbiology, ancillary and laboratory) are listed below for reference.   Imaging Studies: CT Angio Chest Pulmonary Embolism (PE) W or WO Contrast  Result Date: 06/28/2023 CLINICAL DATA:  Shortness of breath, concern for pulmonary embolism. EXAM: CT ANGIOGRAPHY CHEST WITH CONTRAST TECHNIQUE: Multidetector CT imaging  of the chest was performed using the standard protocol during bolus administration of intravenous contrast. Multiplanar CT image reconstructions and MIPs were obtained to evaluate the vascular anatomy. RADIATION DOSE REDUCTION: This exam was performed according to the departmental dose-optimization program which includes automated exposure control, adjustment of the mA and/or kV according to patient size and/or use of iterative reconstruction technique. CONTRAST:  75mL OMNIPAQUE IOHEXOL 350 MG/ML SOLN COMPARISON:  Same day chest radiograph and CT chest dated 02/16/2015. FINDINGS: Cardiovascular: Satisfactory opacification of the pulmonary arteries to the segmental level. No evidence of pulmonary embolism. The heart is mildly enlarged. No pericardial effusion. Mediastinum/Nodes: No enlarged mediastinal, hilar, or axillary lymph nodes. Thyroid gland, trachea, and esophagus demonstrate no significant findings. Lungs/Pleura: There is near complete atelectasis of the right middle lobe. There is mild atelectasis of the lingula. No pleural effusion or  pneumothorax. Upper Abdomen: No acute abnormality. Musculoskeletal: Degenerative changes are seen in the spine. Review of the MIP images confirms the above findings. IMPRESSION: 1. No evidence of pulmonary embolism. 2. Near complete atelectasis of the right middle lobe. Electronically Signed   By: Romona Curls M.D.   On: 06/28/2023 14:15   CT HEAD WO CONTRAST ( )  Result Date: 06/28/2023 CLINICAL DATA:  Headache, increasing frequency or severity EXAM: CT HEAD WITHOUT CONTRAST TECHNIQUE: Contiguous axial images were obtained from the base of the skull through the vertex without intravenous contrast. RADIATION DOSE REDUCTION: This exam was performed according to the departmental dose-optimization program which includes automated exposure control, adjustment of the mA and/or kV according to patient size and/or use of iterative reconstruction technique. COMPARISON:  CT Head 11/07/21 FINDINGS: Brain: No hemorrhage. No hydrocephalus. No extra-axial fluid collection. No CT evidence of an cortical infarct. No mass effect. No mass lesion. There is sequela of mild overall chronic microvascular ischemic change. There are nonspecific speckled calcifications in the central pons, unchanged from prior Vascular: No hyperdense vessel or unexpected calcification. Skull: Normal. Negative for fracture or focal lesion. Sinuses/Orbits: No middle ear effusion. There is a moderate left-sided mastoid effusion. Pansinus mucosal thickening. Left lens replacement. Orbits are otherwise unremarkable. Other: None. IMPRESSION: Pansinus mucosal thickening with moderate left-sided mastoid effusion. No other CT etiology for headaches identified Electronically Signed   By: Lorenza Cambridge M.D.   On: 06/28/2023 14:10   US Venous Img Lower Bilateral  Result Date: 06/28/2023 CLINICAL DATA:  Dyspnea.  Lower extremity pain. EXAM: BILATERAL LOWER EXTREMITY VENOUS DOPPLER ULTRASOUND TECHNIQUE: Gray-scale sonography with compression, as well as  color and duplex ultrasound, were performed to evaluate the deep venous system(s) from the level of the common femoral vein through the popliteal and proximal calf veins. COMPARISON:  None Available. FINDINGS: VENOUS Normal compressibility of the common femoral, superficial femoral, and popliteal veins, as well as the visualized calf veins. Visualized portions of profunda femoral vein and great saphenous vein unremarkable. No filling defects to suggest DVT on grayscale or color Doppler imaging. Doppler waveforms show normal direction of venous flow, normal respiratory plasticity and response to augmentation. Limited views of the contralateral common femoral vein are unremarkable. OTHER None. Limitations: none IMPRESSION: Negative. Electronically Signed   By: Kennith Center M.D.   On: 06/28/2023 13:09   DG Chest Port 1 View  Result Date: 06/28/2023 CLINICAL DATA:  Shortness of breath. EXAM: PORTABLE CHEST 1 VIEW COMPARISON:  April 18, 2023. FINDINGS: Stable cardiomegaly.  Lungs are clear.  Bony thorax is unremarkable. IMPRESSION: No active disease. Electronically Signed   By: Roque Lias  Jr M.D.   On: 06/28/2023 08:50    Microbiology: Results for orders placed or performed during the hospital encounter of 06/28/23  Resp panel by RT-PCR (RSV, Flu A&B, Covid) Anterior Nasal Swab     Status: None   Collection Time: 06/28/23  7:50 AM   Specimen: Anterior Nasal Swab  Result Value Ref Range Status   SARS Coronavirus 2 by RT PCR NEGATIVE NEGATIVE Final    Comment: (NOTE) SARS-CoV-2 target nucleic acids are NOT DETECTED.  The SARS-CoV-2 RNA is generally detectable in upper respiratory specimens during the acute phase of infection. The lowest concentration of SARS-CoV-2 viral copies this assay can detect is 138 copies/mL. A negative result does not preclude SARS-Cov-2 infection and should not be used as the sole basis for treatment or other patient management decisions. A negative result may occur with   improper specimen collection/handling, submission of specimen other than nasopharyngeal swab, presence of viral mutation(s) within the areas targeted by this assay, and inadequate number of viral copies(<138 copies/mL). A negative result must be combined with clinical observations, patient history, and epidemiological information. The expected result is Negative.  Fact Sheet for Patients:  BloggerCourse.com  Fact Sheet for Healthcare Providers:  SeriousBroker.it  This test is no t yet approved or cleared by the [place] FDA and  has been authorized for detection and/or diagnosis of SARS-CoV-2 by FDA under an Emergency Use Authorization (EUA). This EUA will remain  in effect (meaning this test can be used) for the duration of the COVID-19 declaration under Section 564(b)(1) of the Act, 21 U.S.C.section 360bbb-3(b)(1), unless the authorization is terminated  or revoked sooner.       Influenza A by PCR NEGATIVE NEGATIVE Final   Influenza B by PCR NEGATIVE NEGATIVE Final    Comment: (NOTE) The Xpert Xpress SARS-CoV-2/FLU/RSV plus assay is intended as an aid in the diagnosis of influenza from Nasopharyngeal swab specimens and should not be used as a sole basis for treatment. Nasal washings and aspirates are unacceptable for Xpert Xpress SARS-CoV-2/FLU/RSV testing.  Fact Sheet for Patients: BloggerCourse.com  Fact Sheet for Healthcare Providers: SeriousBroker.it  This test is not yet approved or cleared by the [place] FDA and has been authorized for detection and/or diagnosis of SARS-CoV-2 by FDA under an Emergency Use Authorization (EUA). This EUA will remain in effect (meaning this test can be used) for the duration of the COVID-19 declaration under Section 564(b)(1) of the Act, 21 U.S.C. section 360bbb-3(b)(1), unless the authorization is terminated or revoked.      Resp Syncytial Virus by PCR NEGATIVE NEGATIVE Final    Comment: (NOTE) Fact Sheet for Patients: BloggerCourse.com  Fact Sheet for Healthcare Providers: SeriousBroker.it  This test is not yet approved or cleared by the [place] FDA and has been authorized for detection and/or diagnosis of SARS-CoV-2 by FDA under an Emergency Use Authorization (EUA). This EUA will remain in effect (meaning this test can be used) for the duration of the COVID-19 declaration under Section 564(b)(1) of the Act, 21 U.S.C. section 360bbb-3(b)(1), unless the authorization is terminated or revoked.  Performed at [facility], [address] [place], [place] [address]     Labs: CBC: Recent Labs  Lab 06/28/23 0726 06/29/23 0327  WBC 8.9 9.4  HGB 12.9 13.1  HCT 40.4 40.9  MCV 88.6 87.0  PLT 384 421*   Basic Metabolic Panel: Recent Labs  Lab 06/28/23 0726 06/29/23 0327  NA 137 136  K 3.3* 4.2  CL 101  101  CO2 26 26  GLUCOSE 259* 285*  BUN 11 16  CREATININE 0.88 0.86  CALCIUM 8.7* 9.1   Liver Function Tests: Recent Labs  Lab 06/29/23 0327  AST 14*  ALT 17  ALKPHOS 66  BILITOT 0.3  PROT 7.6  ALBUMIN 3.8   CBG: Recent Labs  Lab 06/29/23 1118 06/29/23 1655 06/29/23 1940 06/29/23 2122 06/30/23 0753  GLUCAP 210* 188* 267* 223* 181*    Discharge time spent: greater than 30 minutes.  Signed: Lurene Shadow, MD [facility] 06/30/2023

## 2023-06-30 NOTE — TOC CM/SW Note (Signed)
Transition of Care [facility]) - Inpatient Brief Assessment   Patient Details  Name: Kristina Cruz MRN: [id number] Date of Birth: [birth year]  Transition of Care [facility]) CM/SW Contact:    Allena Katz, LCSW Phone Number: 06/30/2023, 10:22 AM   Clinical Narrative:  Pt has orders to discharge home. No TOC needs,     Transition of Care Asessment: Insurance and Status: Insurance coverage has been reviewed Patient has primary care physician: Yes Home environment has been reviewed: [address] [place] [address] Prior level of function:: No PT/OT needs at baseline. Prior/Current Home Services: No current home services Social Determinants of Health Reivew: SDOH reviewed no interventions necessary Readmission risk has been reviewed: Yes Transition of care needs: no transition of care needs at this time

## 2023-06-30 NOTE — Inpatient Diabetes Management (Signed)
Inpatient Diabetes Program Recommendations  AACE/ADA: New Consensus Statement on Inpatient Glycemic Control (2015)  Target Ranges:  Prepandial:   less than 140 mg/dL      Peak postprandial:   less than 180 mg/dL (1-2 hours)      Critically ill patients:  140 - 180 mg/dL   Lab Results  Component Value Date   GLUCAP 181 (H) 06/30/2023   HGBA1C 8.0 (H) 02/10/2023    Review of Glycemic Control  Latest Reference Range & Units 06/29/23 19:40 06/29/23 21:22 06/30/23 07:53  Glucose-Capillary 70 - 99 mg/dL 161 (H) 096 (H) 045 (H)  (H): Data is abnormally high Diabetes history: DM 2 Outpatient Diabetes medications: Metformin 1000 mg bid, Ozempic Current orders for Inpatient glycemic control:  Novolog 0-15 units tid + hs Novolog 8 units tid meal coverage if eating >50% of meals Metformin 1000 mg bid   Inpatient Diabetes Program Recommendations:     Note: Solumedrol transitioned to PO Prednisone    -  Start Semglee 8 units  Thanks, Lujean Rave, MSN, RNC-OB Diabetes Coordinator [phone] (8a-5p)

## 2023-06-30 NOTE — Plan of Care (Signed)
  Problem: Education: Goal: Knowledge of disease or condition will improve Outcome: Progressing Goal: Knowledge of the prescribed therapeutic regimen will improve Outcome: Progressing Goal: Individualized Educational Video(s) Outcome: Progressing   Problem: Activity: Goal: Ability to tolerate increased activity will improve Outcome: Progressing Goal: Will verbalize the importance of balancing activity with adequate rest periods Outcome: Progressing   Problem: Respiratory: Goal: Ability to maintain a clear airway will improve Outcome: Progressing Goal: Levels of oxygenation will improve Outcome: Progressing Goal: Ability to maintain adequate ventilation will improve Outcome: Progressing   Problem: Education: Goal: Ability to describe self-care measures that may prevent or decrease complications (Diabetes Survival Skills Education) will improve Outcome: Progressing Goal: Individualized Educational Video(s) Outcome: Progressing   Problem: Coping: Goal: Ability to adjust to condition or change in health will improve Outcome: Progressing   Problem: Fluid Volume: Goal: Ability to maintain a balanced intake and output will improve Outcome: Progressing   Problem: Health Behavior/Discharge Planning: Goal: Ability to identify and utilize available resources and services will improve Outcome: Progressing Goal: Ability to manage health-related needs will improve Outcome: Progressing   Problem: Metabolic: Goal: Ability to maintain appropriate glucose levels will improve Outcome: Progressing   Problem: Nutritional: Goal: Maintenance of adequate nutrition will improve Outcome: Progressing Goal: Progress toward achieving an optimal weight will improve Outcome: Progressing   Problem: Skin Integrity: Goal: Risk for impaired skin integrity will decrease Outcome: Progressing   Problem: Tissue Perfusion: Goal: Adequacy of tissue perfusion will improve Outcome: Progressing    Problem: Education: Goal: Knowledge of General Education information will improve Description: Including pain rating scale, medication(s)/side effects and non-pharmacologic comfort measures Outcome: Progressing   Problem: Health Behavior/Discharge Planning: Goal: Ability to manage health-related needs will improve Outcome: Progressing   Problem: Clinical Measurements: Goal: Ability to maintain clinical measurements within normal limits will improve Outcome: Progressing Goal: Will remain free from infection Outcome: Progressing Goal: Diagnostic test results will improve Outcome: Progressing Goal: Respiratory complications will improve Outcome: Progressing Goal: Cardiovascular complication will be avoided Outcome: Progressing   Problem: Activity: Goal: Risk for activity intolerance will decrease Outcome: Progressing   Problem: Nutrition: Goal: Adequate nutrition will be maintained Outcome: Progressing   Problem: Coping: Goal: Level of anxiety will decrease Outcome: Progressing   Problem: Elimination: Goal: Will not experience complications related to bowel motility Outcome: Progressing Goal: Will not experience complications related to urinary retention Outcome: Progressing   Problem: Pain Management: Goal: General experience of comfort will improve Outcome: Progressing   Problem: Safety: Goal: Ability to remain free from injury will improve Outcome: Progressing   Problem: Skin Integrity: Goal: Risk for impaired skin integrity will decrease Outcome: Progressing

## 2023-07-08 ENCOUNTER — Institutional Professional Consult (permissible substitution): Payer: 59 | Admitting: Pulmonary Disease

## 2023-07-29 ENCOUNTER — Encounter: Payer: 59 | Admitting: Internal Medicine

## 2023-07-29 NOTE — Progress Notes (Signed)
 This encounter was created in error - please disregard.

## 2023-08-04 ENCOUNTER — Encounter (INDEPENDENT_AMBULATORY_CARE_PROVIDER_SITE_OTHER): Payer: 59 | Admitting: Vascular Surgery

## 2023-08-21 ENCOUNTER — Emergency Department: Payer: 59

## 2023-08-21 ENCOUNTER — Inpatient Hospital Stay
Admission: EM | Admit: 2023-08-21 | Discharge: 2023-08-24 | DRG: 177 | Disposition: A | Discharge status: Home Health | Payer: 59 | Attending: Obstetrics and Gynecology | Admitting: Obstetrics and Gynecology

## 2023-08-21 ENCOUNTER — Encounter: Payer: Self-pay | Admitting: Radiology

## 2023-08-21 ENCOUNTER — Other Ambulatory Visit: Payer: Self-pay

## 2023-08-21 DIAGNOSIS — K219 Gastro-esophageal reflux disease without esophagitis: Secondary | ICD-10-CM | POA: Diagnosis present

## 2023-08-21 DIAGNOSIS — Z79899 Other long term (current) drug therapy: Secondary | ICD-10-CM

## 2023-08-21 DIAGNOSIS — Z6841 Body Mass Index (BMI) 40.0 and over, adult: Secondary | ICD-10-CM | POA: Diagnosis not present

## 2023-08-21 DIAGNOSIS — R1013 Epigastric pain: Secondary | ICD-10-CM | POA: Diagnosis present

## 2023-08-21 DIAGNOSIS — Z9049 Acquired absence of other specified parts of digestive tract: Secondary | ICD-10-CM | POA: Diagnosis not present

## 2023-08-21 DIAGNOSIS — J440 Chronic obstructive pulmonary disease with acute lower respiratory infection: Secondary | ICD-10-CM | POA: Diagnosis present

## 2023-08-21 DIAGNOSIS — Z803 Family history of malignant neoplasm of breast: Secondary | ICD-10-CM

## 2023-08-21 DIAGNOSIS — I10 Essential (primary) hypertension: Secondary | ICD-10-CM | POA: Diagnosis present

## 2023-08-21 DIAGNOSIS — Z90710 Acquired absence of both cervix and uterus: Secondary | ICD-10-CM

## 2023-08-21 DIAGNOSIS — Z888 Allergy status to other drugs, medicaments and biological substances status: Secondary | ICD-10-CM

## 2023-08-21 DIAGNOSIS — Z8249 Family history of ischemic heart disease and other diseases of the circulatory system: Secondary | ICD-10-CM | POA: Diagnosis not present

## 2023-08-21 DIAGNOSIS — G2581 Restless legs syndrome: Secondary | ICD-10-CM | POA: Diagnosis present

## 2023-08-21 DIAGNOSIS — E1165 Type 2 diabetes mellitus with hyperglycemia: Secondary | ICD-10-CM | POA: Diagnosis present

## 2023-08-21 DIAGNOSIS — Z833 Family history of diabetes mellitus: Secondary | ICD-10-CM

## 2023-08-21 DIAGNOSIS — Z8041 Family history of malignant neoplasm of ovary: Secondary | ICD-10-CM

## 2023-08-21 DIAGNOSIS — J441 Chronic obstructive pulmonary disease with (acute) exacerbation: Secondary | ICD-10-CM | POA: Diagnosis present

## 2023-08-21 DIAGNOSIS — Z7984 Long term (current) use of oral hypoglycemic drugs: Secondary | ICD-10-CM | POA: Diagnosis not present

## 2023-08-21 DIAGNOSIS — K869 Disease of pancreas, unspecified: Secondary | ICD-10-CM | POA: Diagnosis present

## 2023-08-21 DIAGNOSIS — M51369 Other intervertebral disc degeneration, lumbar region without mention of lumbar back pain or lower extremity pain: Secondary | ICD-10-CM | POA: Insufficient documentation

## 2023-08-21 DIAGNOSIS — J45901 Unspecified asthma with (acute) exacerbation: Secondary | ICD-10-CM | POA: Diagnosis present

## 2023-08-21 DIAGNOSIS — Z7985 Long-term (current) use of injectable non-insulin antidiabetic drugs: Secondary | ICD-10-CM | POA: Diagnosis not present

## 2023-08-21 DIAGNOSIS — Z7951 Long term (current) use of inhaled steroids: Secondary | ICD-10-CM

## 2023-08-21 DIAGNOSIS — U071 COVID-19: Principal | ICD-10-CM | POA: Diagnosis present

## 2023-08-21 DIAGNOSIS — R0902 Hypoxemia: Secondary | ICD-10-CM | POA: Diagnosis present

## 2023-08-21 DIAGNOSIS — J9601 Acute respiratory failure with hypoxia: Secondary | ICD-10-CM | POA: Diagnosis present

## 2023-08-21 DIAGNOSIS — J4541 Moderate persistent asthma with (acute) exacerbation: Secondary | ICD-10-CM | POA: Diagnosis present

## 2023-08-21 DIAGNOSIS — R7989 Other specified abnormal findings of blood chemistry: Secondary | ICD-10-CM | POA: Diagnosis present

## 2023-08-21 DIAGNOSIS — J129 Viral pneumonia, unspecified: Secondary | ICD-10-CM

## 2023-08-21 DIAGNOSIS — J1282 Pneumonia due to coronavirus disease 2019: Secondary | ICD-10-CM | POA: Diagnosis present

## 2023-08-21 DIAGNOSIS — K8689 Other specified diseases of pancreas: Secondary | ICD-10-CM | POA: Diagnosis not present

## 2023-08-21 DIAGNOSIS — M48061 Spinal stenosis, lumbar region without neurogenic claudication: Secondary | ICD-10-CM | POA: Insufficient documentation

## 2023-08-21 LAB — COMPREHENSIVE METABOLIC PANEL
ALT: 18 U/L (ref 0–44)
AST: 12 U/L — ABNORMAL LOW (ref 15–41)
Albumin: 3.6 g/dL (ref 3.5–5.0)
Alkaline Phosphatase: 95 U/L (ref 38–126)
Anion gap: 11 (ref 5–15)
BUN: 11 mg/dL (ref 8–23)
CO2: 28 mmol/L (ref 22–32)
Calcium: 9 mg/dL (ref 8.9–10.3)
Chloride: 101 mmol/L (ref 98–111)
Creatinine, Ser: 0.79 mg/dL (ref 0.44–1.00)
GFR, Estimated: >60 mL/min (ref >60)
Glucose, Bld: 180 mg/dL — ABNORMAL HIGH (ref 70–99)
Potassium: 3.5 mmol/L (ref 3.5–5.1)
Sodium: 140 mmol/L (ref 135–145)
Total Bilirubin: 0.7 mg/dL (ref 0.0–1.2)
Total Protein: 8.2 g/dL — ABNORMAL HIGH (ref 6.5–8.1)

## 2023-08-21 LAB — BRAIN NATRIURETIC PEPTIDE: B Natriuretic Peptide: 37.7 pg/mL (ref 0.0–100.0)

## 2023-08-21 LAB — CBC
HCT: 40 % (ref 36.0–46.0)
Hemoglobin: 12.9 g/dL (ref 12.0–15.0)
MCH: 28.2 pg (ref 26.0–34.0)
MCHC: 32.3 g/dL (ref 30.0–36.0)
MCV: 87.5 fL (ref 80.0–100.0)
Platelets: 408 10*3/uL — ABNORMAL HIGH (ref 150–400)
RBC: 4.57 MIL/uL (ref 3.87–5.11)
RDW: 14.8 % (ref 11.5–15.5)
WBC: 11.4 10*3/uL — ABNORMAL HIGH (ref 4.0–10.5)
nRBC: 0 % (ref 0.0–0.2)

## 2023-08-21 LAB — URINALYSIS, ROUTINE W REFLEX MICROSCOPIC
Bilirubin Urine: NEGATIVE
Glucose, UA: NEGATIVE mg/dL
Hgb urine dipstick: NEGATIVE
Ketones, ur: NEGATIVE mg/dL
Leukocytes,Ua: NEGATIVE
Nitrite: NEGATIVE
Protein, ur: NEGATIVE mg/dL
Specific Gravity, Urine: >1.046 — ABNORMAL HIGH (ref 1.005–1.030)
pH: 5 (ref 5.0–8.0)

## 2023-08-21 LAB — D-DIMER, QUANTITATIVE: D-Dimer, Quant: 1.65 ug{FEU}/mL — ABNORMAL HIGH (ref 0.00–0.50)

## 2023-08-21 LAB — CBG MONITORING, ED
Glucose-Capillary: 186 mg/dL — ABNORMAL HIGH (ref 70–99)
Glucose-Capillary: 201 mg/dL — ABNORMAL HIGH (ref 70–99)
Glucose-Capillary: 246 mg/dL — ABNORMAL HIGH (ref 70–99)

## 2023-08-21 LAB — RESP PANEL BY RT-PCR (RSV, FLU A&B, COVID)  RVPGX2
Influenza A by PCR: NEGATIVE
Influenza B by PCR: NEGATIVE
Resp Syncytial Virus by PCR: NEGATIVE
SARS Coronavirus 2 by RT PCR: POSITIVE — AB

## 2023-08-21 LAB — LIPASE, BLOOD: Lipase: 19 U/L (ref 11–51)

## 2023-08-21 LAB — TROPONIN I (HIGH SENSITIVITY)
Troponin I (High Sensitivity): 16 ng/L (ref <18)
Troponin I (High Sensitivity): 6 ng/L (ref <18)

## 2023-08-21 MED ORDER — ACETAMINOPHEN 650 MG RE SUPP
650.0000 mg | Freq: Four times a day (QID) | RECTAL | Status: DC | PRN
Start: 2023-08-21 — End: 2023-08-24

## 2023-08-21 MED ORDER — POLYETHYLENE GLYCOL 3350 17 G PO PACK: 17.0000 g | PACK | Freq: Every day | ORAL | Status: DC | PRN

## 2023-08-21 MED ORDER — IOHEXOL 350 MG/ML SOLN
75.0000 mL | Freq: Once | INTRAVENOUS | Status: AC | PRN
  Administered 2023-08-21: 75 mL via INTRAVENOUS

## 2023-08-21 MED ORDER — ACETAMINOPHEN 325 MG PO TABS: 650.0000 mg | ORAL_TABLET | Freq: Four times a day (QID) | ORAL | Status: DC | PRN

## 2023-08-21 MED ORDER — ATORVASTATIN CALCIUM 20 MG PO TABS
20.0000 mg | ORAL_TABLET | Freq: Every day | ORAL | Status: DC
  Administered 2023-08-22 – 2023-08-24 (×3): 20 mg via ORAL
  Filled 2023-08-21 (×4): qty 1

## 2023-08-21 MED ORDER — ROPINIROLE HCL 1 MG PO TABS
2.0000 mg | ORAL_TABLET | Freq: Two times a day (BID) | ORAL | Status: DC
  Administered 2023-08-22 – 2023-08-24 (×5): 2 mg via ORAL
  Filled 2023-08-21 (×5): qty 2

## 2023-08-21 MED ORDER — OXYCODONE HCL 5 MG PO TABS
5.0000 mg | ORAL_TABLET | Freq: Four times a day (QID) | ORAL | Status: DC | PRN
  Administered 2023-08-22 – 2023-08-24 (×4): 5 mg via ORAL
  Filled 2023-08-21 (×4): qty 1

## 2023-08-21 MED ORDER — ONDANSETRON HCL 4 MG/2ML IJ SOLN: 4.0000 mg | Freq: Four times a day (QID) | INTRAMUSCULAR | Status: DC | PRN

## 2023-08-21 MED ORDER — SODIUM CHLORIDE 0.9% FLUSH
3.0000 mL | Freq: Two times a day (BID) | INTRAVENOUS | Status: DC
  Administered 2023-08-21 – 2023-08-24 (×6): 3 mL via INTRAVENOUS

## 2023-08-21 MED ORDER — IPRATROPIUM-ALBUTEROL 0.5-2.5 (3) MG/3ML IN SOLN
3.0000 mL | Freq: Once | RESPIRATORY_TRACT | Status: AC
  Administered 2023-08-21: 3 mL via RESPIRATORY_TRACT
  Filled 2023-08-21: qty 3

## 2023-08-21 MED ORDER — DEXAMETHASONE SODIUM PHOSPHATE 10 MG/ML IJ SOLN
10.0000 mg | Freq: Once | INTRAMUSCULAR | Status: AC
  Administered 2023-08-21: 10 mg via INTRAVENOUS
  Filled 2023-08-21: qty 1

## 2023-08-21 MED ORDER — IOHEXOL 300 MG/ML  SOLN
100.0000 mL | Freq: Once | INTRAMUSCULAR | Status: AC | PRN
  Administered 2023-08-21: 100 mL via INTRAVENOUS

## 2023-08-21 MED ORDER — MORPHINE SULFATE (PF) 4 MG/ML IV SOLN
4.0000 mg | Freq: Once | INTRAVENOUS | Status: AC
  Administered 2023-08-21: 4 mg via INTRAVENOUS
  Filled 2023-08-21: qty 1

## 2023-08-21 MED ORDER — AMLODIPINE BESYLATE 10 MG PO TABS
10.0000 mg | ORAL_TABLET | Freq: Every day | ORAL | Status: DC
  Administered 2023-08-22 – 2023-08-24 (×3): 10 mg via ORAL
  Filled 2023-08-21 (×2): qty 1
  Filled 2023-08-21: qty 2

## 2023-08-21 MED ORDER — GABAPENTIN 300 MG PO CAPS: 600.0000 mg | ORAL_CAPSULE | Freq: Two times a day (BID) | ORAL | Status: DC | PRN

## 2023-08-21 MED ORDER — IPRATROPIUM-ALBUTEROL 0.5-2.5 (3) MG/3ML IN SOLN
3.0000 mL | Freq: Four times a day (QID) | RESPIRATORY_TRACT | Status: DC
  Administered 2023-08-21 – 2023-08-23 (×5): 3 mL via RESPIRATORY_TRACT
  Filled 2023-08-21 (×5): qty 3

## 2023-08-21 MED ORDER — BUDESONIDE 0.5 MG/2ML IN SUSP
0.5000 mg | Freq: Two times a day (BID) | RESPIRATORY_TRACT | Status: DC
  Administered 2023-08-21 – 2023-08-22 (×2): 0.5 mg via RESPIRATORY_TRACT
  Filled 2023-08-21 (×2): qty 2

## 2023-08-21 MED ORDER — ROPINIROLE HCL 1 MG PO TABS
2.0000 mg | ORAL_TABLET | Freq: Once | ORAL | Status: AC
  Administered 2023-08-21: 2 mg via ORAL
  Filled 2023-08-21: qty 2

## 2023-08-21 MED ORDER — FUROSEMIDE 40 MG PO TABS: 40.0000 mg | ORAL_TABLET | Freq: Every day | ORAL | Status: DC

## 2023-08-21 MED ORDER — HYDROMORPHONE HCL 1 MG/ML IJ SOLN
0.5000 mg | INTRAMUSCULAR | Status: DC | PRN
  Administered 2023-08-21: 0.5 mg via INTRAVENOUS
  Filled 2023-08-21: qty 0.5

## 2023-08-21 MED ORDER — BACLOFEN 10 MG PO TABS
10.0000 mg | ORAL_TABLET | Freq: Three times a day (TID) | ORAL | Status: DC | PRN
  Administered 2023-08-21: 10 mg via ORAL
  Filled 2023-08-21: qty 1

## 2023-08-21 MED ORDER — ONDANSETRON HCL 4 MG/2ML IJ SOLN
4.0000 mg | Freq: Once | INTRAMUSCULAR | Status: AC
  Administered 2023-08-21: 4 mg via INTRAVENOUS
  Filled 2023-08-21: qty 2

## 2023-08-21 MED ORDER — INSULIN ASPART 100 UNIT/ML IJ SOLN
0.0000 [IU] | Freq: Three times a day (TID) | INTRAMUSCULAR | Status: DC
  Administered 2023-08-21: 7 [IU] via SUBCUTANEOUS
  Administered 2023-08-22: 4 [IU] via SUBCUTANEOUS
  Administered 2023-08-22 – 2023-08-23 (×3): 7 [IU] via SUBCUTANEOUS
  Administered 2023-08-23: 3 [IU] via SUBCUTANEOUS
  Administered 2023-08-23: 11 [IU] via SUBCUTANEOUS
  Administered 2023-08-24: 7 [IU] via SUBCUTANEOUS
  Filled 2023-08-21 (×7): qty 1

## 2023-08-21 MED ORDER — DEXAMETHASONE SODIUM PHOSPHATE 10 MG/ML IJ SOLN: 6.0000 mg | INTRAMUSCULAR | Status: DC

## 2023-08-21 MED ORDER — ENOXAPARIN SODIUM 60 MG/0.6ML IJ SOSY
60.0000 mg | PREFILLED_SYRINGE | INTRAMUSCULAR | Status: DC
  Administered 2023-08-21 – 2023-08-23 (×3): 60 mg via SUBCUTANEOUS
  Filled 2023-08-21 (×3): qty 0.6

## 2023-08-21 NOTE — Assessment & Plan Note (Signed)
-   Hold home metformin, Ozempic - A1c pending - SSI, resistant

## 2023-08-21 NOTE — ED Provider Notes (Signed)
 [facility] Provider Note    Event Date/Time   First MD Initiated Contact with Patient 08/21/23 [phone]     (approximate)   History   Shortness of Breath and Abdominal Pain   HPI Kristina Cruz is a 63 y.o. female with history of COPD, DM2, HTN presenting today for shortness of breath.  Patient states she has had ongoing shortness of breath and sinus congestion for the past 2 weeks which is worsened in the last couple of days.  She has had continuous cough with productive green phlegm.  Also noting bilateral lower quadrant abdominal pain associated with nausea and vomiting.  The nausea and vomiting symptoms have resolved at this point.  Also having diarrhea.  Denies chest pain, dysuria, sore throat, leg swelling.  Chart review: Patient with 2 admissions in the past 4 months related to asthma/COPD exacerbation with pneumonia.     Physical Exam   Triage Vital Signs: ED Triage Vitals  Encounter Vitals Group     BP 08/21/23 0854 (!) 132/97     Systolic BP Percentile --      Diastolic BP Percentile --      Pulse Rate 08/21/23 0853 99     Resp 08/21/23 0854 20     Temp 08/21/23 0853 98.4 F (36.9 C)     Temp Source 08/21/23 0853 Oral     SpO2 08/21/23 0853 (!) 86 %     Weight 08/21/23 0854 290 lb 5.5 oz (131.7 kg)     Height 08/21/23 0854 5' 2 (1.575 m)     Head Circumference --      Peak Flow --      Pain Score 08/21/23 0854 10     Pain Loc --      Pain Education --      Exclude from Growth Chart --     Most recent vital signs: Vitals:   08/21/23 0854 08/21/23 1342  BP: (!) 132/97 (!) 147/83  Pulse:  75  Resp: 20 (!) 22  Temp:  98.4 F (36.9 C)  SpO2: 92% 92%   Physical Exam: I have reviewed the vital signs and nursing notes. General: Awake, alert, no acute distress.  Nontoxic appearing. Head:  Atraumatic, normocephalic.   ENT:  EOM intact, PERRL. Oral mucosa is pink and moist with no lesions. Neck: Neck is supple with full range of  motion, No meningeal signs. Cardiovascular:  RRR, No murmurs. Peripheral pulses palpable and equal bilaterally. Respiratory:  Symmetrical chest wall expansion.  Mild tachypnea with slight end expiratory wheezing noted throughout.  Productive cough in the room.  Crackles in the bilateral bases. Musculoskeletal:  No cyanosis or edema. Moving extremities with full ROM Abdomen:  Soft, nontender, nondistended. Neuro:  GCS 15, moving all four extremities, interacting appropriately. Speech clear. Psych:  Calm, appropriate.   Skin:  Warm, dry, no rash.    ED Results / Procedures / Treatments   Labs (all labs ordered are listed, but only abnormal results are displayed) Labs Reviewed  RESP PANEL BY RT-PCR (RSV, FLU A&B, COVID)  RVPGX2 - Abnormal; Notable for the following components:      Result Value   SARS Coronavirus 2 by RT PCR POSITIVE (*)    All other components within normal limits  CBC - Abnormal; Notable for the following components:   WBC 11.4 (*)    Platelets 408 (*)    All other components within normal limits  COMPREHENSIVE METABOLIC PANEL - Abnormal; Notable for the  following components:   Glucose, Bld 180 (*)    Total Protein 8.2 (*)    AST 12 (*)    All other components within normal limits  URINALYSIS, ROUTINE W REFLEX MICROSCOPIC - Abnormal; Notable for the following components:   Color, Urine YELLOW (*)    APPearance CLEAR (*)    Specific Gravity, Urine >1.046 (*)    All other components within normal limits  D-DIMER, QUANTITATIVE - Abnormal; Notable for the following components:   D-Dimer, Quant 1.65 (*)    All other components within normal limits  CBG MONITORING, ED - Abnormal; Notable for the following components:   Glucose-Capillary 186 (*)    All other components within normal limits  LIPASE, BLOOD  BRAIN NATRIURETIC PEPTIDE  TROPONIN I (HIGH SENSITIVITY)  TROPONIN I (HIGH SENSITIVITY)     EKG My EKG interpretation: Rate of 105, right bundle branch block  with left anterior fascicular block.  No other acute ST elevation or depression.  EKG overall consistent with 1 on 06/28/2023   RADIOLOGY Independently interpreted chest x-ray with concerns for possible pneumonia   PROCEDURES:  Critical Care performed: Yes, see critical care procedure note(s)  .Critical Care  Performed by: Malvina Alm DASEN, MD Authorized by: Malvina Alm DASEN, MD   Critical care provider statement:    Critical care time (minutes):  30   Critical care was necessary to treat or prevent imminent or life-threatening deterioration of the following conditions:  Respiratory failure   Critical care was time spent personally by me on the following activities:  Development of treatment plan with patient or surrogate, discussions with consultants, evaluation of patient's response to treatment, examination of patient, ordering and review of laboratory studies, ordering and review of radiographic studies, ordering and performing treatments and interventions, pulse oximetry, re-evaluation of patient's condition and review of old charts   I assumed direction of critical care for this patient from another provider in my specialty: no      MEDICATIONS ORDERED IN ED: Medications  iohexol  (OMNIPAQUE ) 350 MG/ML injection 75 mL (has no administration in time range)  ipratropium-albuterol  (DUONEB) 0.5-2.5 (3) MG/3ML nebulizer solution 3 mL (3 mLs Nebulization Given 08/21/23 0949)  ondansetron  (ZOFRAN ) injection 4 mg (4 mg Intravenous Given 08/21/23 0948)  morphine  (PF) 4 MG/ML injection 4 mg (4 mg Intravenous Given 08/21/23 0949)  iohexol  (OMNIPAQUE ) 300 MG/ML solution 100 mL (100 mLs Intravenous Contrast Given 08/21/23 1017)  dexamethasone  (DECADRON ) injection 10 mg (10 mg Intravenous Given 08/21/23 1041)  rOPINIRole  (REQUIP ) tablet 2 mg (2 mg Oral Given 08/21/23 1409)  morphine  (PF) 4 MG/ML injection 4 mg (4 mg Intravenous Given 08/21/23 1446)     IMPRESSION / MDM / ASSESSMENT AND PLAN / ED COURSE  I  reviewed the triage vital signs and the nursing notes.                              Differential diagnosis includes, but is not limited to, COVID, flu, and RSV, pneumonia, pneumothorax, ACS, COPD exacerbation, PE  Patient's presentation is most consistent with acute presentation with potential threat to life or bodily function.  Patient is a 63 year old female presenting today for shortness of breath.  End-expiratory wheezing present on exam along with tachypnea.  Was found to be 86% on room air and placed on 2 L.  Improvement in breathing symptoms following DuoNeb treatment.  Ultimately found to be COVID-positive likely explaining her hypoxia and COPD exacerbation  today.  Was given Decadron  with this as well.  Separately was having abdominal pain symptoms and CT abdomen/pelvis performed.  No acute intra-abdominal findings.  However there was an incidental finding of a pancreatic mass which recommended further workup with an MRI outpatient.  This will be passed along to the hospitalist team.  Patient did have an elevated D-dimer and further imaging with CTA chest was performed to evaluate for PE.  This was pending at time of signout and signout given to oncoming provider with plan for admission.  The patient is on the cardiac monitor to evaluate for evidence of arrhythmia and/or significant heart rate changes. Clinical Course as of 08/21/23 1521  Fri Aug 21, 2023  1230 SpO2(!): 86 % Placed on 2 L now satting mid 90s [DW]  1505 Covid with acute hypoxia, history of reactive airway - duonebs, steroids, CTA pending. Admit for hypoxia and covid  [SM]    Clinical Course User Index [DW] Malvina Alm DASEN, MD [SM] Suzanne Kirsch, MD     FINAL CLINICAL IMPRESSION(S) / ED DIAGNOSES   Final diagnoses:  COVID  Hypoxia  COPD exacerbation (HCC)  Pancreatic mass     Rx / DC Orders   ED Discharge Orders     None        Note:  This document was prepared using Dragon voice recognition software  and may include unintentional dictation errors.   Malvina Alm DASEN, MD 08/21/23 [phone]

## 2023-08-21 NOTE — Assessment & Plan Note (Signed)
-   Resume home antihypertensives tomorrow 

## 2023-08-21 NOTE — ED Notes (Signed)
Patient placed on a purewick.  

## 2023-08-21 NOTE — Assessment & Plan Note (Addendum)
 Patient presenting with worsening cough and fever over the last 1 week, with lung view seen on the CT of the abdomen notable for patchy groundglass opacities concerning for multifocal pneumonia.   - Decadron  6 mg daily IV - Supportive management with Tussionex, guaifenesin 

## 2023-08-21 NOTE — Assessment & Plan Note (Addendum)
 Patient presented with right sided abdominal pain, with CT of the abdomen concerning for masslike thickening involving the proximal of mid tail of the pancreas.  Lipase is not elevated. Given abdominal pain has been ongoing for nearly 6 weeks, less likely to be acute pancreatitis.   - Will need dedicated MRI; unable to obtain today due to multiple doses of IV contrast. Ordered for tomorrow.  - May require oncology consultation pending MRI results - IV fluids - Pain control - Zofran  as needed

## 2023-08-21 NOTE — ED Provider Notes (Signed)
 Care assumed of patient from outgoing provider.  See their note for initial history, exam and plan.  Clinical Course as of 08/21/23 1537  Fri Aug 21, 2023  1230 SpO2(!): 86 % Placed on 2 L now satting mid 90s [DW]  1505 Covid with acute hypoxia, history of reactive airway - duonebs, steroids, CTA pending. Admit for hypoxia and covid  [SM]    Clinical Course User Index [DW] Malvina Alm DASEN, MD [SM] Suzanne Kirsch, MD  [facility] hospitalist for admission.  CTA currently pending.   Suzanne Kirsch, MD 08/21/23 1537

## 2023-08-21 NOTE — Assessment & Plan Note (Addendum)
 Patient is with shortness of breath and abdominal pain, found to have acute hypoxic respiratory failure.  Multifactorial in the setting of COPD exacerbation and COVID-19 pneumonia.  - Continue supplemental oxygen to maintain oxygen saturation above 88% - Wean as tolerated - CTA ordered in the ED pending - Management of asthma and pneumonia as noted below

## 2023-08-21 NOTE — Assessment & Plan Note (Signed)
 History of moderate persistent asthma now exacerbated by a viral pneumonia.  - DuoNebs every 6 hours - Pulmicort twice daily - Pulmonary toilet - Decadron 6 mg daily IV

## 2023-08-21 NOTE — ED Triage Notes (Signed)
 Pt here with SOB and abd pain x1 week. Pt also had a fever x2 days. Pt states abd pain is left sided and radiates to her right. Pt endorses nausea and vomiting. Pt having labored breathing in triage. Pt denies cp but endorses a productive cough with green phlegm.

## 2023-08-21 NOTE — H&P (Signed)
 History and Physical    Patient: Kristina Cruz FMW:[id number] DOB: 1961-06-05 DOA: 08/21/2023 DOS: the patient was seen and examined on 08/21/2023 PCP: System, Provider Not In  Patient coming from: Home  Chief Complaint:  Chief Complaint  Patient presents with   Shortness of Breath   Abdominal Pain   HPI: Kristina Cruz is a 63 y.o. female with medical history significant of moderate persistent asthma, type 2 diabetes, morbid obesity, hypertension, who presents to the ED due to shortness of breath and abdominal pain.  [name] is states that her cough has been persistent and productive for at least 6 weeks now, however she has noticed a significant worsening.  She also endorses shortness of breath that has been worsening.  She notes that several days ago, she developed new onset fevers that have been recurrent.  She denies any chest pain palpitations, or lower extremity swelling.  She endorses right-sided abdominal pain that started approximately 6 weeks ago.  She was reluctant to seek medical care regarding this, however her grandson heard her crying at night due to the pain.  She denies any nausea, vomiting.  ED course: On arrival to the ED, patient was hypertensive at 132/97 with heart rate of 99.  She was saturating at 86% on room air and placed on 2 L with improvement to 92%.  She was afebrile at 98.4.  Initial workup notable for WBC of 11.4, platelets 408, glucose 180, creatinine 0.79 with GFR above 60.  Troponin negative x 2 and BNP within normal limits at 37.  D-dimer elevated at 1.65.  COVID-19 PCR positive.  Urinalysis with increased Pacific gravity only.  CT of the abdomen was obtained with evidence of masslike thickening of the pancreas with relative hypoenhancement concerning for mass versus inflammation, patchy groundglass opacities in the lungs.  Chest x-ray was obtained with concern for mild to moderate pulmonary edema and possible right lower lobe pneumonia.  Patient  started on Decadron , DuoNebs, morphine , and Zofran .  CTA ordered and pending.  [facility] contacted for admission.  Review of Systems: As mentioned in the history of present illness. All other systems reviewed and are negative.  Past Medical History:  Diagnosis Date   Anemia    Asthma    Back pain    Cataract    Diabetes mellitus without complication (HCC)    GERD (gastroesophageal reflux disease)    Hypertension    Morbid obesity with BMI of 60.0-69.9, adult (HCC)    Restless leg syndrome    Past Surgical History:  Procedure Laterality Date   ABDOMINAL HYSTERECTOMY     CATARACT EXTRACTION W/PHACO Left 04/13/2020   Procedure: CATARACT EXTRACTION PHACO AND INTRAOCULAR LENS PLACEMENT (IOC);  Surgeon: Ferol Rogue, MD;  Location: [facility] ORS;  Service: Ophthalmology;  Laterality: Left;  US  00:36.0 CDE 5.95 Fluid Pack lot # [id number]   HYSTEROSCOPY WITH D & C N/A 07/14/2017   Procedure: DILATATION AND CURETTAGE /HYSTEROSCOPY;  Surgeon: Arloa Lamar SQUIBB, MD;  Location: [facility] OR[facility];  Service: Gynecology;  Laterality: N/A;   POLYPECTOMY  2015   Social History:  reports that she has never smoked. She has never used smokeless tobacco. She reports that she does not drink alcohol  and does not use drugs.  Allergies  Allergen Reactions   Peanut-Containing Drug Products     Family History  Problem Relation Age of Onset   Breast cancer Mother 75   Diabetes Mother    Hypertension Mother    Ovarian cancer Paternal Aunt        ?  Diabetes Father    Hypertension Father     Prior to Admission medications   Medication Sig Start Date End Date Taking? Authorizing Provider  acetaminophen  (TYLENOL  8 HOUR ARTHRITIS PAIN) 650 MG CR tablet Take 3 tablets (1,950 mg total) by mouth every 8 (eight) hours as needed for pain. Do not exceed 4000 mg total in a day.  Home med. 04/21/23   Awanda City, MD  amLODipine  (NORVASC ) 10 MG tablet Take 10 mg by mouth daily. 11/24/22   [provider]  atorvastatin   (LIPITOR) 20 MG tablet Take 20 mg by mouth daily.    [provider]  baclofen  (LIORESAL ) 10 MG tablet Take 10 mg by mouth 3 (three) times daily.    [provider]  diphenhydrAMINE  (BENADRYL ) 50 MG tablet Take 1 tablet (50 mg total) by mouth every 6 (six) hours as needed for itching (swelling). 09/19/20   Angelena Smalls, MD  EPINEPHrine  0.3 mg/0.3 mL IJ SOAJ injection Inject 0.3 mg into the muscle as needed for anaphylaxis. 09/19/20   Angelena Smalls, MD  fluticasone  furoate-vilanterol (BREO ELLIPTA ) 100-25 MCG/ACT AEPB Inhale 1 puff into the lungs daily. 02/13/23   Alexander, Natalie, DO  furosemide  (LASIX ) 40 MG tablet Take 40 mg by mouth daily. 03/23/20   [provider]  gabapentin  (NEURONTIN ) 300 MG capsule Take 600 mg by mouth 2 (two) times daily.    [provider]  guaiFENesin  (MUCINEX ) 600 MG 12 hr tablet Take 1 tablet (600 mg total) by mouth 2 (two) times daily as needed for cough or to loosen phlegm. 02/13/23   Alexander, Natalie, DO  ipratropium-albuterol  (DUONEB) 0.5-2.5 (3) MG/3ML SOLN Take 3 mLs by nebulization every 4 (four) hours as needed (wheezing / shortness of breath). 02/13/23   Alexander, Natalie, DO  melatonin 10 MG TABS Take 10 mg by mouth at bedtime as needed. 04/21/23   Awanda City, MD  metFORMIN  (GLUCOPHAGE ) 1000 MG tablet Take 1,000 mg by mouth 2 (two) times daily. 11/27/22   [provider]  OZEMPIC, 0.25 OR 0.5 MG/DOSE, 2 MG/3ML SOPN Inject into the skin.    [provider]  rOPINIRole  (REQUIP ) 2 MG tablet Take 2 mg by mouth in the morning and at bedtime.     [provider]  sodium chloride  (OCEAN) 0.65 % SOLN nasal spray Place 1 spray into both nostrils as needed for up to 5 days for congestion. 06/30/23 07/05/23  Jens Durand, MD  VENTOLIN  HFA 108 (90 Base) MCG/ACT inhaler Inhale 2 puffs into the lungs every 4 (four) hours as needed for wheezing or shortness of breath. 02/13/23   Marsa Edelman, DO    Physical  Exam: Vitals:   08/21/23 0853 08/21/23 0854 08/21/23 1342  BP:  (!) 132/97 (!) 147/83  Pulse: 99  75  Resp:  20 (!) 22  Temp: 98.4 F (36.9 C)  98.4 F (36.9 C)  TempSrc: Oral  Oral  SpO2: (!) 86% 92% 92%  Weight:  131.7 kg   Height:  5' 2 (1.575 m)    Physical Exam Vitals and nursing note reviewed.  Constitutional:      Appearance: She is obese. She is ill-appearing.  HENT:     Head: Normocephalic and atraumatic.     Mouth/Throat:     Mouth: Mucous membranes are dry.  Eyes:     Conjunctiva/sclera: Conjunctivae normal.     Pupils: Pupils are equal, round, and reactive to light.  Cardiovascular:     Rate and Rhythm: Normal rate  and regular rhythm.     Heart sounds: No murmur heard. Pulmonary:     Effort: Tachypnea present.     Breath sounds: Wheezing (Diffuse expiratory wheezing) and rhonchi (Diffuse) present.  Abdominal:     Palpations: Abdomen is soft. There is no mass.     Tenderness: There is abdominal tenderness (Right upper quadrant). There is no guarding.  Musculoskeletal:     Right lower leg: No edema.     Left lower leg: No edema.  Skin:    General: Skin is warm and dry.  Neurological:     General: No focal deficit present.     Mental Status: She is alert and oriented to person, place, and time.  Psychiatric:        Mood and Affect: Mood normal.        Behavior: Behavior normal.    Data Reviewed: CBC with WBC of 11.4, hemoglobin of 12.9, platelets of 408 CMP with sodium of 140, potassium 3.5, bicarb 28, glucose 180, creatinine 0.79, AST 12, ALT 18, GFR above 60 Lipase 19 BNP 37 Troponin 16 and then 6 D-dimer 1.65 COVID-19 PCR positive.  Influenza and RSV PCR negative Urinalysis with increased Pacific gravity only  EKG personally reviewed.  Sinus tachycardia with rate of 105.  Right bundle branch block.  Compared to EKG obtained in November 2024, right bundle branch block is chronic.  CT ABDOMEN PELVIS W CONTRAST Result Date: 08/21/2023 CLINICAL  DATA:  Abdominal pain, most severe in the right lower and left lower quadrants with associated nausea, vomiting and diarrhea. EXAM: CT ABDOMEN AND PELVIS WITH CONTRAST TECHNIQUE: Multidetector CT imaging of the abdomen and pelvis was performed using the standard protocol following bolus administration of intravenous contrast. RADIATION DOSE REDUCTION: This exam was performed according to the departmental dose-optimization program which includes automated exposure control, adjustment of the mA and/or kV according to patient size and/or use of iterative reconstruction technique. CONTRAST:  OMNIPAQUE  IOHEXOL  300 MG/ML  SOLN COMPARISON:  10/16/2022 FINDINGS: Comment: Exam detail is diminished due to motion artifact predominantly affecting the upper abdominal structures. Lower chest: Patchy ground-glass and airspace densities identified within both lower lobes. These are new compared with the previous exam and are concerning for underlying inflammatory/infectious process. Hepatobiliary: No suspicious liver abnormality. Status post cholecystectomy. No biliary ductal dilatation. Pancreas: Masslike thickening involving the proximal and mid tail of pancreas with relative hypoenhancement measures approximately 3.9 x 5.5 cm, image 6/7. In the absence of signs/symptoms of acute pancreatitis underlying neoplastic process cannot be excluded. Spleen: Normal in size without focal abnormality. Adrenals/Urinary Tract: Normal appearance of the adrenal glands. The kidneys are normal. No nephrolithiasis or hydronephrosis. Urinary bladder is unremarkable. Stomach/Bowel: Stomach appears nondistended. There is no dilated loops of large or small bowel to suggest obstruction. No bowel wall thickening or inflammation. Sigmoid diverticulosis without signs of acute diverticulitis. Moderate retained stool identified within the colon. Vascular/Lymphatic: Normal caliber of the abdominal aorta. No signs of abdominopelvic adenopathy.  Reproductive: Status post hysterectomy. No adnexal masses. Other: No free fluid or fluid collections. Marked diastasis recti with ventral herniation of the large and small bowel loops. Periumbilical hernia is containing fat only. No free fluid or fluid collections. Musculoskeletal: Severe degenerative changes involving both hips and both SI joints. Multilevel lumbar spondylosis. No acute or suspicious osseous findings. IMPRESSION: 1. Masslike thickening involving the proximal and mid tail of pancreas with relative hypoenhancement measures approximately 3.9 x 5.5 cm. In the absence of signs/symptoms of acute pancreatitis underlying  neoplastic process cannot be excluded. Further evaluation with nonemergent contrast enhanced MRI of the abdomen is advised. Note: Given patient's body habitus and the motion artifact observed on the current exam an MRI obtained at this time is likely to be severely limited and likely nondiagnostic. MRI should be obtained only once the patient is clinically stable, and is able to remain motionless and breath hold. 2. Patchy ground-glass and airspace densities identified within both lower lobes. These are new compared with the previous exam and are concerning for underlying inflammatory/infectious process. 3. Sigmoid diverticulosis without signs of acute diverticulitis. 4. Marked diastasis recti with ventral herniation of the large and small bowel loops. 5. Periumbilical hernia is containing fat only. Electronically Signed   By: Waddell Calk M.D.   On: 08/21/2023 12:05   DG Chest 2 View Result Date: 08/21/2023 CLINICAL DATA:  Shortness of breath. EXAM: CHEST - 2 VIEW COMPARISON:  06/28/2023. FINDINGS: Low lung volume. There is mild-to-moderate pulmonary edema, slightly accentuated by low lung volume. There is nonspecific heterogeneous opacity overlying the right lower lung zone, which may represent atelectasis and/or pneumonia. Bilateral lung fields are otherwise clear. No acute  consolidation or lung collapse. Bilateral costophrenic angles are clear. Stable cardio-mediastinal silhouette. No acute osseous abnormalities. The soft tissues are within normal limits. IMPRESSION: 1. Mild pulmonary edema, slightly accentuated by low lung volume. 2. Nonspecific heterogeneous opacities overlying the right lower lung zone may represent atelectasis and/or pneumonia. Correlate clinically. Electronically Signed   By: Ree Molt M.D.   On: 08/21/2023 10:12   Results are pending, will review when available.  Assessment and Plan:  * Acute hypoxic respiratory failure (HCC) Patient is with shortness of breath and abdominal pain, found to have acute hypoxic respiratory failure.  Multifactorial in the setting of COPD exacerbation and COVID-19 pneumonia.  - Continue supplemental oxygen to maintain oxygen saturation above 88% - Wean as tolerated - CTA ordered in the ED pending - Management of asthma and pneumonia as noted below  Asthma exacerbation History of moderate persistent asthma now exacerbated by a viral pneumonia.  - DuoNebs every 6 hours - Pulmicort  twice daily - Pulmonary toilet - Decadron  6 mg daily IV  Pneumonia due to COVID-19 virus Patient presenting with worsening cough and fever over the last 1 week, with lung view seen on the CT of the abdomen notable for patchy groundglass opacities concerning for multifocal pneumonia.   - Decadron  6 mg daily IV - Supportive management with Tussionex, guaifenesin   Pancreatic mass Patient presented with right sided abdominal pain, with CT of the abdomen concerning for masslike thickening involving the proximal of mid tail of the pancreas.  Lipase is not elevated. Given abdominal pain has been ongoing for nearly 6 weeks, less likely to be acute pancreatitis.   - Will need dedicated MRI; unable to obtain today due to multiple doses of IV contrast. Ordered for tomorrow.  - May require oncology consultation pending MRI results -  IV fluids - Pain control - Zofran  as needed  Uncontrolled type 2 diabetes mellitus with hyperglycemia, without long-term current use of insulin  (HCC) - Hold home metformin , Ozempic - A1c pending - SSI, resistant  Hypertension - Resume home antihypertensives tomorrow  Advance Care Planning:   Code Status: Full Code   Consults: None  Family Communication: No family at bedside  Severity of Illness: The appropriate patient status for this patient is INPATIENT. Inpatient status is judged to be reasonable and necessary in order to provide the required intensity of  service to ensure the patient's safety. The patient's presenting symptoms, physical exam findings, and initial radiographic and laboratory data in the context of their chronic comorbidities is felt to place them at high risk for further clinical deterioration. Furthermore, it is not anticipated that the patient will be medically stable for discharge from the hospital within 2 midnights of admission.   * I certify that at the point of admission it is my clinical judgment that the patient will require inpatient hospital care spanning beyond 2 midnights from the point of admission due to high intensity of service, high risk for further deterioration and high frequency of surveillance required.*  Author: Clayborne Broom, MD 08/21/2023 4:38 PM  For on call review [email or web address].

## 2023-08-22 ENCOUNTER — Encounter: Payer: Self-pay | Admitting: Internal Medicine

## 2023-08-22 DIAGNOSIS — J9601 Acute respiratory failure with hypoxia: Secondary | ICD-10-CM | POA: Diagnosis not present

## 2023-08-22 LAB — CBC
HCT: 38.2 % (ref 36.0–46.0)
Hemoglobin: 12.2 g/dL (ref 12.0–15.0)
MCH: 27.4 pg (ref 26.0–34.0)
MCHC: 31.9 g/dL (ref 30.0–36.0)
MCV: 85.8 fL (ref 80.0–100.0)
Platelets: 412 10*3/uL — ABNORMAL HIGH (ref 150–400)
RBC: 4.45 MIL/uL (ref 3.87–5.11)
RDW: 14.6 % (ref 11.5–15.5)
WBC: 10.9 10*3/uL — ABNORMAL HIGH (ref 4.0–10.5)
nRBC: 0 % (ref 0.0–0.2)

## 2023-08-22 LAB — BASIC METABOLIC PANEL
Anion gap: 10 (ref 5–15)
BUN: 11 mg/dL (ref 8–23)
CO2: 28 mmol/L (ref 22–32)
Calcium: 9.1 mg/dL (ref 8.9–10.3)
Chloride: 101 mmol/L (ref 98–111)
Creatinine, Ser: 0.79 mg/dL (ref 0.44–1.00)
GFR, Estimated: >60 mL/min (ref >60)
Glucose, Bld: 200 mg/dL — ABNORMAL HIGH (ref 70–99)
Potassium: 4.4 mmol/L (ref 3.5–5.1)
Sodium: 139 mmol/L (ref 135–145)

## 2023-08-22 LAB — CBG MONITORING, ED
Glucose-Capillary: 205 mg/dL — ABNORMAL HIGH (ref 70–99)
Glucose-Capillary: 183 mg/dL — ABNORMAL HIGH (ref 70–99)

## 2023-08-22 LAB — GLUCOSE, CAPILLARY
Glucose-Capillary: 234 mg/dL — ABNORMAL HIGH (ref 70–99)
Glucose-Capillary: 309 mg/dL — ABNORMAL HIGH (ref 70–99)

## 2023-08-22 MED ORDER — ALBUTEROL SULFATE HFA 108 (90 BASE) MCG/ACT IN AERS: 2.0000 | INHALATION_SPRAY | Freq: Four times a day (QID) | RESPIRATORY_TRACT | Status: DC | PRN

## 2023-08-22 MED ORDER — INSULIN GLARGINE-YFGN 100 UNIT/ML ~~LOC~~ SOLN
5.0000 [IU] | Freq: Every day | SUBCUTANEOUS | Status: DC
  Administered 2023-08-22 – 2023-08-23 (×2): 5 [IU] via SUBCUTANEOUS
  Filled 2023-08-22 (×3): qty 0.05

## 2023-08-22 MED ORDER — ALBUTEROL SULFATE (2.5 MG/3ML) 0.083% IN NEBU: 2.5000 mg | INHALATION_SOLUTION | RESPIRATORY_TRACT | Status: DC | PRN

## 2023-08-22 MED ORDER — POLYETHYLENE GLYCOL 3350 17 G PO PACK
34.0000 g | PACK | Freq: Every day | ORAL | Status: DC
  Administered 2023-08-22: 34 g via ORAL
  Filled 2023-08-22 (×3): qty 2

## 2023-08-22 MED ORDER — BUDESONIDE 180 MCG/ACT IN AEPB
2.0000 | INHALATION_SPRAY | Freq: Two times a day (BID) | RESPIRATORY_TRACT | Status: DC
  Administered 2023-08-23 – 2023-08-24 (×2): 2 via RESPIRATORY_TRACT
  Filled 2023-08-22 (×2): qty 1

## 2023-08-22 MED ORDER — DEXAMETHASONE 6 MG PO TABS
6.0000 mg | ORAL_TABLET | Freq: Every day | ORAL | Status: DC
  Administered 2023-08-22 – 2023-08-24 (×3): 6 mg via ORAL
  Filled 2023-08-22 (×3): qty 1

## 2023-08-22 NOTE — Progress Notes (Signed)
 PROGRESS NOTE    Kristina Cruz  FMW:[id number] DOB: May 10, 1961 DOA: 08/21/2023 PCP: System, Provider Not In  Outpatient Specialists: pulm    Brief Narrative:   From admission h and p  Kristina Cruz is a 63 y.o. female with medical history significant of moderate persistent asthma, type 2 diabetes, morbid obesity, hypertension, who presents to the ED due to shortness of breath and abdominal pain.   Kristina Cruz is states that her cough has been persistent and productive for at least 6 weeks now, however she has noticed a significant worsening.  She also endorses shortness of breath that has been worsening.  She notes that several days ago, she developed new onset fevers that have been recurrent.  She denies any chest pain palpitations, or lower extremity swelling.  She endorses right-sided abdominal pain that started approximately 6 weeks ago.  She was reluctant to seek medical care regarding this, however her grandson heard her crying at night due to the pain.  She denies any nausea, vomiting.  Assessment & Plan:   Principal Problem:   Acute hypoxic respiratory failure (HCC) Active Problems:   Asthma exacerbation   Pneumonia due to COVID-19 virus   Pancreatic mass   Morbid obesity (HCC)   Hypertension   Uncontrolled type 2 diabetes mellitus with hyperglycemia, without long-term current use of insulin  (HCC)  # Asthma with acute exacerbation Has pulm f/u scheduled 1/16 Dr. Isaiah - continue decadron  - continue breathing treatments (duonebs and prn albuterol )  # Acute hypoxic respiratory failure 2/2 asthma exacerbation, obesity likely contributing - [place] O2, wean as able  # Covid-19 infection With signs multifocal pna on CTA, no PE - continue decadron   # Abdominal pain Epigastric and right sided. Prior cholecystectomy. Possibly 2/2 pancreatic mass. Nothing else acute seen on CT. LFTs unremarkable, normal lipase. Tolerating diet, no vomiting or diarrhea - monitor  #  Pancreatic mass Masslike thickening involving the proximal and mid tail of pancreas on CT. Needs contrast-enhanced MRI to further characterize, but patient doesn't think she can hold still and cooperate with breathing instructions today. LFTs/lipase wnl - will attempt MRI prior to d/c if patient thinks she can cooperate, otherwise will need to pursue as outpt  # HTN Normotensive - cont home amlodipine , atorvastatin  - hold home lasix   # Restless legs - home requip   # T2DM With mild glucose elevations - hold home met/ozempic - SSI - semglee  5  # Morbid obesity Complicates care   DVT prophylaxis: lovenox  Code Status: full Family Communication: daughter updated telephonically 1/4  Level of care: Med-Surg Status is: Inpatient Remains inpatient appropriate because: severity of illness    Consultants:  none  Procedures: none  Antimicrobials:  none    Subjective: Reports ongoing cough/dyspnea, abd pain resolved with pain med  Objective: Vitals:   08/21/23 2220 08/22/23 0131 08/22/23 0442 08/22/23 0800  BP: (!) 150/80 127/78 137/73 (!) 147/94  Pulse: 64 (!) 58 (!) 52 65  Resp: 16 14 16 13   Temp: 98.1 F (36.7 C) 98 F (36.7 C) 98 F (36.7 C)   TempSrc: Oral Oral Oral   SpO2: 97% 98% 96% 95%  Weight:  132 kg    Height:  5' 2 (1.575 m)      Intake/Output Summary (Last 24 hours) at 08/22/2023 0808 Last data filed at 08/22/2023 9287 Gross per 24 hour  Intake 3 ml  Output 750 ml  Net -747 ml   Filed Weights   08/21/23 0854 08/22/23 0131  Weight: 131.7 kg  132 kg    Examination:  General exam: Appears calm and comfortable  Respiratory system: decreased air entry, scattered rhonchi, exp wheeze Cardiovascular system: S1 & S2 heard, RRR.    Gastrointestinal system: Abdomen is obese, soft and nontender.   Central nervous system: Alert and oriented. No focal neurological deficits. Extremities: Symmetric 5 x 5 power. Skin: No rashes, lesions or ulcers. Trace  LE edema Psychiatry: Judgement and insight appear normal. Mood & affect appropriate.     Data Reviewed: I have personally reviewed following labs and imaging studies  CBC: Recent Labs  Lab 08/21/23 0924 08/22/23 0526  WBC 11.4* 10.9*  HGB 12.9 12.2  HCT 40.0 38.2  MCV 87.5 85.8  PLT 408* 412*   Basic Metabolic Panel: Recent Labs  Lab 08/21/23 0924 08/22/23 0526  NA 140 139  K 3.5 4.4  CL 101 101  CO2 28 28  GLUCOSE 180* 200*  BUN 11 11  CREATININE 0.79 0.79  CALCIUM  9.0 9.1   GFR: Estimated Creatinine Clearance: 93 mL/min (by C-G formula based on SCr of 0.79 mg/dL). Liver Function Tests: Recent Labs  Lab 08/21/23 0924  AST 12*  ALT 18  ALKPHOS 95  BILITOT 0.7  PROT 8.2*  ALBUMIN  3.6   Recent Labs  Lab 08/21/23 0924  LIPASE 19   No results for input(s): AMMONIA in the last 168 hours. Coagulation Profile: No results for input(s): INR, PROTIME in the last 168 hours. Cardiac Enzymes: No results for input(s): CKTOTAL, CKMB, CKMBINDEX, TROPONINI in the last 168 hours. BNP (last 3 results) No results for input(s): PROBNP in the last 8760 hours. HbA1C: No results for input(s): HGBA1C in the last 72 hours. CBG: Recent Labs  Lab 08/21/23 1234 08/21/23 1707 08/21/23 2108 08/22/23 0752  GLUCAP 186* 201* 246* 205*   Lipid Profile: No results for input(s): CHOL, HDL, LDLCALC, TRIG, CHOLHDL, LDLDIRECT in the last 72 hours. Thyroid  Function Tests: No results for input(s): TSH, T4TOTAL, FREET4, T3FREE, THYROIDAB in the last 72 hours. Anemia Panel: No results for input(s): VITAMINB12, FOLATE, FERRITIN, TIBC, IRON, RETICCTPCT in the last 72 hours. Urine analysis:    Component Value Date/Time   COLORURINE YELLOW (A) 08/21/2023 1343   APPEARANCEUR CLEAR (A) 08/21/2023 1343   APPEARANCEUR Cloudy 08/11/2013 0124   LABSPEC >1.046 (H) 08/21/2023 1343   LABSPEC 1.021 08/11/2013 0124   PHURINE 5.0 08/21/2023  1343   GLUCOSEU NEGATIVE 08/21/2023 1343   GLUCOSEU Negative 08/11/2013 0124   HGBUR NEGATIVE 08/21/2023 1343   BILIRUBINUR NEGATIVE 08/21/2023 1343   BILIRUBINUR Negative 08/11/2013 0124   KETONESUR NEGATIVE 08/21/2023 1343   PROTEINUR NEGATIVE 08/21/2023 1343   NITRITE NEGATIVE 08/21/2023 1343   LEUKOCYTESUR NEGATIVE 08/21/2023 1343   LEUKOCYTESUR Negative 08/11/2013 0124   Sepsis Labs: @LABRCNTIP (procalcitonin:4,lacticidven:4)  ) Recent Results (from the past 240 hours)  Resp panel by RT-PCR (RSV, Flu A&B, Covid) Anterior Nasal Swab     Status: Abnormal   Collection Time: 08/21/23  9:24 AM   Specimen: Anterior Nasal Swab  Result Value Ref Range Status   SARS Coronavirus 2 by RT PCR POSITIVE (A) NEGATIVE Final    Comment: (NOTE) SARS-CoV-2 target nucleic acids are DETECTED.  The SARS-CoV-2 RNA is generally detectable in upper respiratory specimens during the acute phase of infection. Positive results are indicative of the presence of the identified virus, but do not rule out bacterial infection or co-infection with other pathogens not detected by the test. Clinical correlation with patient history and other diagnostic information is necessary  to determine patient infection status. The expected result is Negative.  Fact Sheet for Patients: bloggercourse.com  Fact Sheet for Healthcare Providers: seriousbroker.it  This test is not yet approved or cleared by the [place]  FDA and  has been authorized for detection and/or diagnosis of SARS-CoV-2 by FDA under an Emergency Use Authorization (EUA).  This EUA will remain in effect (meaning this test can be used) for the duration of  the COVID-19 declaration under Section 564(b)(1) of the A ct, 21 U.S.C. section 360bbb-3(b)(1), unless the authorization is terminated or revoked sooner.     Influenza A by PCR NEGATIVE NEGATIVE Final   Influenza B by PCR NEGATIVE NEGATIVE  Final    Comment: (NOTE) The Xpert Xpress SARS-CoV-2/FLU/RSV plus assay is intended as an aid in the diagnosis of influenza from Nasopharyngeal swab specimens and should not be used as a sole basis for treatment. Nasal washings and aspirates are unacceptable for Xpert Xpress SARS-CoV-2/FLU/RSV testing.  Fact Sheet for Patients: bloggercourse.com  Fact Sheet for Healthcare Providers: seriousbroker.it  This test is not yet approved or cleared by the [place]  FDA and has been authorized for detection and/or diagnosis of SARS-CoV-2 by FDA under an Emergency Use Authorization (EUA). This EUA will remain in effect (meaning this test can be used) for the duration of the COVID-19 declaration under Section 564(b)(1) of the Act, 21 U.S.C. section 360bbb-3(b)(1), unless the authorization is terminated or revoked.     Resp Syncytial Virus by PCR NEGATIVE NEGATIVE Final    Comment: (NOTE) Fact Sheet for Patients: bloggercourse.com  Fact Sheet for Healthcare Providers: seriousbroker.it  This test is not yet approved or cleared by the [place]  FDA and has been authorized for detection and/or diagnosis of SARS-CoV-2 by FDA under an Emergency Use Authorization (EUA). This EUA will remain in effect (meaning this test can be used) for the duration of the COVID-19 declaration under Section 564(b)(1) of the Act, 21 U.S.C. section 360bbb-3(b)(1), unless the authorization is terminated or revoked.  Performed at [facility], [address], [place], [place] [address]          Radiology Studies: CT Angio Chest PE W and/or Wo Contrast Result Date: 08/21/2023 CLINICAL DATA:  Hypoxia, shortness of breath and elevated D-dimer. Fever and productive cough. EXAM: CT ANGIOGRAPHY CHEST WITH CONTRAST TECHNIQUE: Multidetector CT imaging of the chest was performed using the standard  protocol during bolus administration of intravenous contrast. Multiplanar CT image reconstructions and MIPs were obtained to evaluate the vascular anatomy. RADIATION DOSE REDUCTION: This exam was performed according to the departmental dose-optimization program which includes automated exposure control, adjustment of the mA and/or kV according to patient size and/or use of iterative reconstruction technique. CONTRAST:  75mL OMNIPAQUE  IOHEXOL  350 MG/ML SOLN COMPARISON:  06/28/2023 FINDINGS: Cardiovascular: Stable enlarged heart. Normally opacified pulmonary arteries with no pulmonary arterial filling defects seen. Common trunk of the left common carotid artery and the right innominate artery. Mediastinum/Nodes: Interval enlarged subcarinal and right hilar lymph nodes including a subcarinal node with a short axis diameter of 16 mm on image number 134/5 and right hilar node with a short axis diameter of 18 mm on image number 129/5. Unremarkable thyroid  gland, esophagus and trachea. Lungs/Pleura: Interval multiple areas of patchy airspace opacity in the right upper lobe and to a lesser degree in the right lower lobe and left upper lobe. Mild bilateral lower lobe linear atelectasis. No pleural fluid. Upper Abdomen: Cholecystectomy clips. Musculoskeletal: Thoracic and cervical spine degenerative changes with changes  of DISH. Review of the MIP images confirms the above findings. IMPRESSION: 1. No pulmonary embolism. 2. Interval multifocal pneumonia. 3. Interval enlarged subcarinal and right hilar lymph nodes compatible with reactive nodes related to the patient's pneumonia. 4. Stable cardiomegaly. Electronically Signed   By: Elspeth Bathe M.D.   On: 08/21/2023 17:29   CT ABDOMEN PELVIS W CONTRAST Result Date: 08/21/2023 CLINICAL DATA:  Abdominal pain, most severe in the right lower and left lower quadrants with associated nausea, vomiting and diarrhea. EXAM: CT ABDOMEN AND PELVIS WITH CONTRAST TECHNIQUE: Multidetector CT  imaging of the abdomen and pelvis was performed using the standard protocol following bolus administration of intravenous contrast. RADIATION DOSE REDUCTION: This exam was performed according to the departmental dose-optimization program which includes automated exposure control, adjustment of the mA and/or kV according to patient size and/or use of iterative reconstruction technique. CONTRAST:  100mL OMNIPAQUE  IOHEXOL  300 MG/ML  SOLN COMPARISON:  10/16/2022 FINDINGS: Comment: Exam detail is diminished due to motion artifact predominantly affecting the upper abdominal structures. Lower chest: Patchy ground-glass and airspace densities identified within both lower lobes. These are new compared with the previous exam and are concerning for underlying inflammatory/infectious process. Hepatobiliary: No suspicious liver abnormality. Status post cholecystectomy. No biliary ductal dilatation. Pancreas: Masslike thickening involving the proximal and mid tail of pancreas with relative hypoenhancement measures approximately 3.9 x 5.5 cm, image 6/7. In the absence of signs/symptoms of acute pancreatitis underlying neoplastic process cannot be excluded. Spleen: Normal in size without focal abnormality. Adrenals/Urinary Tract: Normal appearance of the adrenal glands. The kidneys are normal. No nephrolithiasis or hydronephrosis. Urinary bladder is unremarkable. Stomach/Bowel: Stomach appears nondistended. There is no dilated loops of large or small bowel to suggest obstruction. No bowel wall thickening or inflammation. Sigmoid diverticulosis without signs of acute diverticulitis. Moderate retained stool identified within the colon. Vascular/Lymphatic: Normal caliber of the abdominal aorta. No signs of abdominopelvic adenopathy. Reproductive: Status post hysterectomy. No adnexal masses. Other: No free fluid or fluid collections. Marked diastasis recti with ventral herniation of the large and small bowel loops. Periumbilical hernia  is containing fat only. No free fluid or fluid collections. Musculoskeletal: Severe degenerative changes involving both hips and both SI joints. Multilevel lumbar spondylosis. No acute or suspicious osseous findings. IMPRESSION: 1. Masslike thickening involving the proximal and mid tail of pancreas with relative hypoenhancement measures approximately 3.9 x 5.5 cm. In the absence of signs/symptoms of acute pancreatitis underlying neoplastic process cannot be excluded. Further evaluation with nonemergent contrast enhanced MRI of the abdomen is advised. Note: Given patient's body habitus and the motion artifact observed on the current exam an MRI obtained at this time is likely to be severely limited and likely nondiagnostic. MRI should be obtained only once the patient is clinically stable, and is able to remain motionless and breath hold. 2. Patchy ground-glass and airspace densities identified within both lower lobes. These are new compared with the previous exam and are concerning for underlying inflammatory/infectious process. 3. Sigmoid diverticulosis without signs of acute diverticulitis. 4. Marked diastasis recti with ventral herniation of the large and small bowel loops. 5. Periumbilical hernia is containing fat only. Electronically Signed   By: Waddell Calk M.D.   On: 08/21/2023 12:05   DG Chest 2 View Result Date: 08/21/2023 CLINICAL DATA:  Shortness of breath. EXAM: CHEST - 2 VIEW COMPARISON:  06/28/2023. FINDINGS: Low lung volume. There is mild-to-moderate pulmonary edema, slightly accentuated by low lung volume. There is nonspecific heterogeneous opacity overlying the right lower  lung zone, which may represent atelectasis and/or pneumonia. Bilateral lung fields are otherwise clear. No acute consolidation or lung collapse. Bilateral costophrenic angles are clear. Stable cardio-mediastinal silhouette. No acute osseous abnormalities. The soft tissues are within normal limits. IMPRESSION: 1. Mild  pulmonary edema, slightly accentuated by low lung volume. 2. Nonspecific heterogeneous opacities overlying the right lower lung zone may represent atelectasis and/or pneumonia. Correlate clinically. Electronically Signed   By: Ree Molt M.D.   On: 08/21/2023 10:12        Scheduled Meds:  amLODipine   10 mg Oral Daily   atorvastatin   20 mg Oral Daily   budesonide  (PULMICORT ) nebulizer solution  0.5 mg Nebulization BID   dexamethasone  (DECADRON ) injection  6 mg Intravenous Q24H   enoxaparin  (LOVENOX ) injection  60 mg Subcutaneous Q24H   furosemide   40 mg Oral Daily   insulin  aspart  0-20 Units Subcutaneous TID WC   ipratropium-albuterol   3 mL Nebulization Q6H   rOPINIRole   2 mg Oral BID   sodium chloride  flush  3 mL Intravenous Q12H   Continuous Infusions:   LOS: 1 day     Devaughn KATHEE Ban, MD [facility]   If 7PM-7AM, please contact night-coverage [email or web address] Password [email or web address] 08/22/2023, 8:08 AM

## 2023-08-23 ENCOUNTER — Inpatient Hospital Stay: Payer: 59

## 2023-08-23 DIAGNOSIS — J9601 Acute respiratory failure with hypoxia: Secondary | ICD-10-CM | POA: Diagnosis not present

## 2023-08-23 LAB — BASIC METABOLIC PANEL
Anion gap: 11 (ref 5–15)
BUN: 18 mg/dL (ref 8–23)
CO2: 29 mmol/L (ref 22–32)
Calcium: 8.7 mg/dL — ABNORMAL LOW (ref 8.9–10.3)
Chloride: 97 mmol/L — ABNORMAL LOW (ref 98–111)
Creatinine, Ser: 0.78 mg/dL (ref 0.44–1.00)
GFR, Estimated: >60 mL/min (ref >60)
Glucose, Bld: 254 mg/dL — ABNORMAL HIGH (ref 70–99)
Potassium: 4 mmol/L (ref 3.5–5.1)
Sodium: 137 mmol/L (ref 135–145)

## 2023-08-23 LAB — GLUCOSE, CAPILLARY
Glucose-Capillary: 226 mg/dL — ABNORMAL HIGH (ref 70–99)
Glucose-Capillary: 125 mg/dL — ABNORMAL HIGH (ref 70–99)
Glucose-Capillary: 296 mg/dL — ABNORMAL HIGH (ref 70–99)
Glucose-Capillary: 367 mg/dL — ABNORMAL HIGH (ref 70–99)

## 2023-08-23 MED ORDER — GUAIFENESIN-DM 100-10 MG/5ML PO SYRP
5.0000 mL | ORAL_SOLUTION | ORAL | Status: DC | PRN
  Administered 2023-08-23 – 2023-08-24 (×2): 5 mL via ORAL
  Filled 2023-08-23 (×2): qty 10

## 2023-08-23 MED ORDER — LORATADINE 10 MG PO TABS
10.0000 mg | ORAL_TABLET | Freq: Every day | ORAL | Status: DC | PRN
  Administered 2023-08-23: 10 mg via ORAL
  Filled 2023-08-23: qty 1

## 2023-08-23 MED ORDER — LORAZEPAM 2 MG/ML IJ SOLN
1.0000 mg | Freq: Once | INTRAMUSCULAR | Status: DC | PRN
  Filled 2023-08-23: qty 1

## 2023-08-23 MED ORDER — GADOBUTROL 1 MMOL/ML IV SOLN: 10.0000 mL | Freq: Once | INTRAVENOUS | Status: DC | PRN

## 2023-08-23 MED ORDER — IPRATROPIUM-ALBUTEROL 20-100 MCG/ACT IN AERS
1.0000 | INHALATION_SPRAY | Freq: Four times a day (QID) | RESPIRATORY_TRACT | Status: DC
  Administered 2023-08-23 – 2023-08-24 (×5): 1 via RESPIRATORY_TRACT
  Filled 2023-08-23: qty 4

## 2023-08-23 NOTE — Evaluation (Signed)
 Physical Therapy Evaluation Patient Details Name: Kristina Cruz MRN: [id number] DOB: 1961-08-16 Today's Date: 08/23/2023  History of Present Illness  presented to ER secondary to SOB, abdominal pain; admitted for management of acute hypoxic respiratory failure due to multifocal PNa related to COVID-19 virus. Also noted with pancreatic mass; workup ongoing.  Clinical Impression  Patient seated edge of bed upon arrival to room; alert and oriented, follows commands and agreeable to participation with session.  Denies pain; does endorse persistent SOB.  Bilat UE/LE strength and ROM grossly symmetrical and WFL; no focal weakness appreciated.  Able to complete sit/stand, standing balance, basic transfers and gait (25') with Rw, cga/close sup.  Demonstrates very forward flexed posture at hips (prefers resting/propping forearms on RW, baseline for her); short, choppy steps, but fairly stable without buckling or LOB. Minimal SOB with exertion, maintains sats >95% on RA throughout.  Higher level balance, endurance deficits evident; patient with fair/good awareness and use of compensatory strategies as needed. Would benefit from skilled PT to address above deficits and promote optimal return to PLOF.; recommend post-acute PT follow up as indicated by interdisciplinary care team.   '        If plan is discharge home, recommend the following: A little help with walking and/or transfers;A little help with bathing/dressing/bathroom   Can travel by private vehicle        Equipment Recommendations  (has RW, SPC and stand up walker; not interested in anything further)  Recommendations for Other Services       Functional Status Assessment Patient has had a recent decline in their functional status and demonstrates the ability to make significant improvements in function in a reasonable and predictable amount of time.     Precautions / Restrictions Precautions Precautions: Fall Restrictions Weight  Bearing Restrictions Per Provider Order: No      Mobility  Bed Mobility               General bed mobility comments: seated edge of bed upon arrival to session; in recliner end of session    Transfers Overall transfer level: Needs assistance Equipment used: Rolling walker (2 wheels) Transfers: Sit to/from Stand, Bed to chair/wheelchair/BSC Sit to Stand: Supervision, Contact guard assist Stand pivot transfers: Supervision, Contact guard assist              Ambulation/Gait Ambulation/Gait assistance: Supervision, Contact guard assist Gait Distance (Feet): 25 Feet Assistive device: Rolling walker (2 wheels)         General Gait Details: very forward flexed posture at hips (prefers resting/propping forearms on RW, baseline for her); short, choppy steps, but fairly stable without buckling or LOB.  Minimal SOB with exertion, maintains sats >95% on RA throughout  Stairs            Wheelchair Mobility     Tilt Bed    Modified Rankin (Stroke Patients Only)       Balance Overall balance assessment: Needs assistance Sitting-balance support: No upper extremity supported, Feet supported Sitting balance-Leahy Scale: Good     Standing balance support: Bilateral upper extremity supported Standing balance-Leahy Scale: Fair Standing balance comment: functional reach grossly 4-5 from immediate BOS, often preferring/utilizing contralateral UE for external stabilization                             Pertinent Vitals/Pain Pain Assessment Pain Assessment: No/denies pain    Home Living Family/patient expects to be discharged to:: Private residence Living  Arrangements: Alone Available Help at Discharge: Family;Personal care attendant;Available PRN/intermittently Type of Home: Apartment Home Access: Stairs to enter   Entrance Stairs-Number of Steps: 1 step to enter apt building (no railing) and then 3-4 steps with B railing (unable to reach both at same  time) to enter home   Home Layout: One level        Prior Function Prior Level of Function : Needs assist             Mobility Comments: Pt reports being modified independent ambulating with RW (limited distances d/t SOB/weakness); requires 2 assist typically with stairs; no home O2 use; had been receiving home health services working on strengthening her back and legs ADLs Comments: Pt reports having home health aid 3 hours/day (7 days a week) to assist with IADLs.     Extremity/Trunk Assessment   Upper Extremity Assessment Upper Extremity Assessment: Overall WFL for tasks assessed    Lower Extremity Assessment Lower Extremity Assessment: Overall WFL for tasks assessed (grossly at least 4/5 throughout)       Communication   Communication Communication: No apparent difficulties  Cognition Arousal: Alert Behavior During Therapy: WFL for tasks assessed/performed Overall Cognitive Status: Within Functional Limits for tasks assessed                                 General Comments: Motivated to maintain indep as long as possible        General Comments      Exercises     Assessment/Plan    PT Assessment Patient needs continued PT services  PT Problem List Decreased activity tolerance;Decreased balance;Decreased mobility;Decreased knowledge of use of DME;Decreased safety awareness;Decreased knowledge of precautions;Cardiopulmonary status limiting activity;Obesity       PT Treatment Interventions DME instruction;Gait training;Stair training;Functional mobility training;Therapeutic activities;Therapeutic exercise;Patient/family education    PT Goals (Current goals can be found in the Care Plan section)  Acute Rehab PT Goals Patient Stated Goal: to return home, to be able to do as much for myself as i can PT Goal Formulation: With patient Time For Goal Achievement: 09/06/23 Potential to Achieve Goals: Good    Frequency Min 1X/week      Co-evaluation               AM-PAC PT 6 Clicks Mobility  Outcome Measure Help needed turning from your back to your side while in a flat bed without using bedrails?: None Help needed moving from lying on your back to sitting on the side of a flat bed without using bedrails?: None Help needed moving to and from a bed to a chair (including a wheelchair)?: None Help needed standing up from a chair using your arms (e.g., wheelchair or bedside chair)?: None Help needed to walk in hospital room?: A Little Help needed climbing 3-5 steps with a railing? : A Little 6 Click Score: 22    End of Session   Activity Tolerance: Patient tolerated treatment well Patient left: in chair;with call bell/phone within reach;with chair alarm set Nurse Communication: Mobility status PT Visit Diagnosis: Difficulty in walking, not elsewhere classified (R26.2)    Time: 1220-1240 PT Time Calculation (min) (ACUTE ONLY): 20 min   Charges:   PT Evaluation $PT Eval Moderate Complexity: 1 Mod   PT General Charges $$ ACUTE PT VISIT: 1 Visit        Tatiyana Foucher H. Delores, PT, DPT, NCS 08/23/23, 1:54 PM [phone]

## 2023-08-23 NOTE — TOC Initial Note (Signed)
 Transition of Care [facility]) - Initial/Assessment Note    Patient Details  Name: Kristina Cruz MRN: [id number] Date of Birth: [birth year]  Transition of Care [facility]) CM/SW Contact:    Desmin Daleo E Lawson Mahone, LCSW Phone Number: 08/23/2023, 4:43 PM  Clinical Narrative:                 Patient is on airborne isolation. CSW spoke with patient by phone. Patient is from home alone. Her daughter is involved and supportive. PCP is [facility]. Pharmacy is [facility] or [address]. Patient has an aide 3 hours per day through [facility] [employer], she is trying to see if they can come an extra hour to make up for the time she is in the hospital. Patient has a walker and states she is in the process of getting an electric wheelchair. Either her daughter or her aide provide transportation to appointments. Patient is agreeable to recommendation for home health PT, she declines agency preference other than not [facility] - she requests a [facility] Aide as well. Referral accepted by Cindie with [facility].  Expected Discharge Plan: Home w Home Health Services Barriers to Discharge: Continued Medical Work up   Patient Goals and CMS Choice Patient states their goals for this hospitalization and ongoing recovery are:: home with home health CMS Medicare.gov Compare Post Acute Care list provided to:: Patient Choice offered to / list presented to : Patient      Expected Discharge Plan and Services       Living arrangements for the past 2 months: Single Family Home                           HH Arranged: PT, Nurse's Aide HH Agency: [facility] Date [facility] Agency Contacted: 08/23/23   Representative spoke with at [facility] Agency: Cindie  Prior Living Arrangements/Services Living arrangements for the past 2 months: Single Family Home Lives with:: Self Patient language and need for interpreter reviewed:: Yes Do you feel safe going back to the place where you live?: Yes      Need for Family Participation in  Patient Care: Yes (Comment) Care giver support system in place?: Yes (comment) Current home services: DME Criminal Activity/Legal Involvement Pertinent to Current Situation/Hospitalization: No - Comment as needed  Activities of Daily Living   ADL Screening (condition at time of admission) Independently performs ADLs?: No Does the patient have a NEW difficulty with bathing/dressing/toileting/self-feeding that is expected to last >3 days?: No Does the patient have a NEW difficulty with getting in/out of bed, walking, or climbing stairs that is expected to last >3 days?: No Does the patient have a NEW difficulty with communication that is expected to last >3 days?: No Is the patient deaf or have difficulty hearing?: No Does the patient have difficulty seeing, even when wearing glasses/contacts?: No Does the patient have difficulty concentrating, remembering, or making decisions?: No  Permission Sought/Granted Permission sought to share information with : Oceanographer granted to share information with : Yes, Verbal Permission Granted     Permission granted to share info w AGENCY: HH agencies        Emotional Assessment       Orientation: : Oriented to Self, Oriented to Place, Oriented to  Time, Oriented to Situation Alcohol  / Substance Use: Not Applicable Psych Involvement: No (comment)  Admission diagnosis:  Hypoxia [R09.02] Pancreatic mass [K86.89] COPD exacerbation (HCC) [J44.1] COVID [U07.1] Acute hypoxic respiratory failure (HCC) [  J96.01] Patient Active Problem List   Diagnosis Date Noted   Acute hypoxic respiratory failure (HCC) 08/21/2023   Pneumonia due to COVID-19 virus 08/21/2023   Pancreatic mass 08/21/2023   Spinal stenosis of lumbar region 08/21/2023   Degeneration of lumbar intervertebral disc 08/21/2023   Mastoid effusion, left 06/30/2023   Sinusitis 06/28/2023   Type 2 diabetes mellitus (HCC) 06/28/2023   Asthma exacerbation  06/28/2023   COPD with acute bronchitis (HCC) 04/18/2023   COPD with acute exacerbation (HCC) 04/18/2023   Respiratory failure with hypoxia (HCC) 02/10/2023   Uncontrolled type 2 diabetes mellitus with hyperglycemia, without long-term current use of insulin  (HCC) 02/08/2018   Abnormal ECG 12/31/2017   Chest pain with low risk for cardiac etiology 12/31/2017   Preop cardiovascular exam 12/31/2017   SOB (shortness of breath) on exertion 12/31/2017   Restless leg syndrome 12/16/2017   Hypertension 12/16/2017   Endometrial hyperplasia without atypia, simple 12/03/2017   Endometrial polyp 11/11/2017   Post-menopausal bleeding 11/11/2017   Endometrial thickening on ultrasound 04/27/2017   Morbid obesity (HCC) 04/27/2017   PCP:  System, Provider Not In Pharmacy:   [id number] [address], [place] - [address] AT [facility] [address] [place] [address] Phone: [phone] Fax: [phone]     Social Drivers of Health (SDOH) Social History: SDOH Screenings   Food Insecurity: No Food Insecurity (08/22/2023)  Housing: Low Risk  (08/22/2023)  Recent Concern: Housing - High Risk (08/22/2023)  Transportation Needs: Unmet Transportation Needs (08/22/2023)  Utilities: Not At Risk (08/22/2023)  Financial Resource Strain: Low Risk  (09/26/2022)   [facility], [facility]  Social Connections: Patient Declined (08/22/2023)  Tobacco Use: Low Risk  (08/22/2023)   SDOH Interventions:     Readmission Risk Interventions    08/23/2023    4:39 PM  Readmission Risk Prevention Plan  Transportation Screening Complete  PCP or Specialist Appt within 5-7 Days Complete  Home Care Screening Complete  Medication Review (RN CM) Complete

## 2023-08-23 NOTE — Plan of Care (Signed)
 Pt alert and oriented x 4. Up 1 assist with walker. Pt unable to stand up fully. Pt states she has back trouble and needs hip and knee surgery so that why her back is bad. Received 1 dose of oxy this shift. Weaned oxygen to 1 liter sating 98-100%.  Problem: Education: Goal: Knowledge of risk factors and measures for prevention of condition will improve Outcome: Progressing   Problem: Coping: Goal: Psychosocial and spiritual needs will be supported Outcome: Progressing   Problem: Respiratory: Goal: Will maintain a patent airway Outcome: Progressing Goal: Complications related to the disease process, condition or treatment will be avoided or minimized Outcome: Progressing   Problem: Education: Goal: Ability to describe self-care measures that may prevent or decrease complications (Diabetes Survival Skills Education) will improve Outcome: Progressing Goal: Individualized Educational Video(s) Outcome: Progressing   Problem: Coping: Goal: Ability to adjust to condition or change in health will improve Outcome: Progressing   Problem: Fluid Volume: Goal: Ability to maintain a balanced intake and output will improve Outcome: Progressing   Problem: Health Behavior/Discharge Planning: Goal: Ability to identify and utilize available resources and services will improve Outcome: Progressing Goal: Ability to manage health-related needs will improve Outcome: Progressing   Problem: Metabolic: Goal: Ability to maintain appropriate glucose levels will improve Outcome: Progressing   Problem: Nutritional: Goal: Maintenance of adequate nutrition will improve Outcome: Progressing Goal: Progress toward achieving an optimal weight will improve Outcome: Progressing   Problem: Skin Integrity: Goal: Risk for impaired skin integrity will decrease Outcome: Progressing   Problem: Tissue Perfusion: Goal: Adequacy of tissue perfusion will improve Outcome: Progressing   Problem: Education: Goal:  Knowledge of General Education information will improve Description: Including pain rating scale, medication(s)/side effects and non-pharmacologic comfort measures Outcome: Progressing   Problem: Health Behavior/Discharge Planning: Goal: Ability to manage health-related needs will improve Outcome: Progressing   Problem: Clinical Measurements: Goal: Ability to maintain clinical measurements within normal limits will improve Outcome: Progressing Goal: Will remain free from infection Outcome: Progressing Goal: Diagnostic test results will improve Outcome: Progressing Goal: Respiratory complications will improve Outcome: Progressing Goal: Cardiovascular complication will be avoided Outcome: Progressing   Problem: Activity: Goal: Risk for activity intolerance will decrease Outcome: Progressing   Problem: Nutrition: Goal: Adequate nutrition will be maintained Outcome: Progressing   Problem: Coping: Goal: Level of anxiety will decrease Outcome: Progressing   Problem: Elimination: Goal: Will not experience complications related to bowel motility Outcome: Progressing Goal: Will not experience complications related to urinary retention Outcome: Progressing   Problem: Pain Management: Goal: General experience of comfort will improve Outcome: Progressing   Problem: Safety: Goal: Ability to remain free from injury will improve Outcome: Progressing   Problem: Skin Integrity: Goal: Risk for impaired skin integrity will decrease Outcome: Progressing

## 2023-08-23 NOTE — Progress Notes (Signed)
 PROGRESS NOTE    Kristina Cruz  FMW:[id number] DOB: [birth year] DOA: 08/21/2023 PCP: System, Provider Not In  Outpatient Specialists: pulm    Brief Narrative:   From admission h and p  Kristina Cruz is a 63 y.o. female with medical history significant of moderate persistent asthma, type 2 diabetes, morbid obesity, hypertension, who presents to the ED due to shortness of breath and abdominal pain.   Kristina Cruz is states that her cough has been persistent and productive for at least 6 weeks now, however she has noticed a significant worsening.  She also endorses shortness of breath that has been worsening.  She notes that several days ago, she developed new onset fevers that have been recurrent.  She denies any chest pain palpitations, or lower extremity swelling.  She endorses right-sided abdominal pain that started approximately 6 weeks ago.  She was reluctant to seek medical care regarding this, however her grandson heard her crying at night due to the pain.  She denies any nausea, vomiting.  Assessment & Plan:   Principal Problem:   Acute hypoxic respiratory failure (HCC) Active Problems:   Asthma exacerbation   Pneumonia due to COVID-19 virus   Pancreatic mass   Morbid obesity (HCC)   Hypertension   Uncontrolled type 2 diabetes mellitus with hyperglycemia, without long-term current use of insulin  (HCC)  # Asthma with acute exacerbation Has pulm f/u scheduled 1/16 Dr. Isaiah - continue decadron  - continue breathing treatments (duonebs and prn albuterol )  # Acute hypoxic respiratory failure 2/2 asthma exacerbation, obesity likely contributing. Weaned to room air today - monitor  # Covid-19 infection With signs multifocal pna on CTA, no PE - continue decadron   # Abdominal pain Epigastric and right sided. Prior cholecystectomy. Possibly 2/2 pancreatic mass. Nothing else acute seen on CT. LFTs unremarkable, normal lipase. Tolerating diet, no vomiting or diarrhea. Pain  resolved today - monitor  # Pancreatic mass Masslike thickening involving the proximal and mid tail of pancreas on CT. Needs contrast-enhanced MRI to further characterize, but patient doesn't think she can hold still and cooperate with breathing instructions today. LFTs/lipase wnl - will attempt MRI today as patient says she's confident she can hold still for it  # HTN Normotensive - cont home amlodipine , atorvastatin  - hold home lasix   # Restless legs - home requip   # T2DM With mild glucose elevations - hold home met/ozempic - SSI - semglee  5  # Morbid obesity Complicates care  # Debility - PT consult pending   DVT prophylaxis: lovenox  Code Status: full Family Communication: daughter updated telephonically 1/4  Level of care: Med-Surg Status is: Inpatient Remains inpatient appropriate because: severity of illness    Consultants:  none  Procedures: none  Antimicrobials:  none    Subjective: Reports cough and dyspnea improving, denies abd pain  Objective: Vitals:   08/23/23 0200 08/23/23 0329 08/23/23 0422 08/23/23 0831  BP:   123/74 133/76  Pulse:   75 70  Resp:   18 20  Temp:   98 F (36.7 C) 98.2 F (36.8 C)  TempSrc:   Oral Oral  SpO2: 97% 99% 100% 96%  Weight:      Height:       No intake or output data in the 24 hours ending 08/23/23 1257  Filed Weights   08/21/23 0854 08/22/23 0131  Weight: 131.7 kg 132 kg    Examination:  General exam: Appears calm and comfortable  Respiratory system: decreased air entry, scattered rhonchi, exp wheeze Cardiovascular system:  S1 & S2 heard, RRR.    Gastrointestinal system: Abdomen is obese, soft and nontender.   Central nervous system: Alert and oriented. No focal neurological deficits. Extremities: Symmetric 5 x 5 power. Skin: No rashes, lesions or ulcers. Trace LE edema Psychiatry: Judgement and insight appear normal. Mood & affect appropriate.     Data Reviewed: I have personally reviewed  following labs and imaging studies  CBC: Recent Labs  Lab 08/21/23 0924 08/22/23 0526  WBC 11.4* 10.9*  HGB 12.9 12.2  HCT 40.0 38.2  MCV 87.5 85.8  PLT 408* 412*   Basic Metabolic Panel: Recent Labs  Lab 08/21/23 0924 08/22/23 0526 08/23/23 0646  NA 140 139 137  K 3.5 4.4 4.0  CL 101 101 97*  CO2 28 28 29   GLUCOSE 180* 200* 254*  BUN 11 11 18   CREATININE 0.79 0.79 0.78  CALCIUM  9.0 9.1 8.7*   GFR: Estimated Creatinine Clearance: 93 mL/min (by C-G formula based on SCr of 0.78 mg/dL). Liver Function Tests: Recent Labs  Lab 08/21/23 0924  AST 12*  ALT 18  ALKPHOS 95  BILITOT 0.7  PROT 8.2*  ALBUMIN  3.6   Recent Labs  Lab 08/21/23 0924  LIPASE 19   No results for input(s): AMMONIA in the last 168 hours. Coagulation Profile: No results for input(s): INR, PROTIME in the last 168 hours. Cardiac Enzymes: No results for input(s): CKTOTAL, CKMB, CKMBINDEX, TROPONINI in the last 168 hours. BNP (last 3 results) No results for input(s): PROBNP in the last 8760 hours. HbA1C: No results for input(s): HGBA1C in the last 72 hours. CBG: Recent Labs  Lab 08/22/23 1211 08/22/23 1727 08/22/23 2107 08/23/23 0826 08/23/23 1208  GLUCAP 183* 234* 309* 226* 125*   Lipid Profile: No results for input(s): CHOL, HDL, LDLCALC, TRIG, CHOLHDL, LDLDIRECT in the last 72 hours. Thyroid  Function Tests: No results for input(s): TSH, T4TOTAL, FREET4, T3FREE, THYROIDAB in the last 72 hours. Anemia Panel: No results for input(s): VITAMINB12, FOLATE, FERRITIN, TIBC, IRON, RETICCTPCT in the last 72 hours. Urine analysis:    Component Value Date/Time   COLORURINE YELLOW (A) 08/21/2023 1343   APPEARANCEUR CLEAR (A) 08/21/2023 1343   APPEARANCEUR Cloudy 08/11/2013 0124   LABSPEC >1.046 (H) 08/21/2023 1343   LABSPEC 1.021 08/11/2013 0124   PHURINE 5.0 08/21/2023 1343   GLUCOSEU NEGATIVE 08/21/2023 1343   GLUCOSEU Negative  08/11/2013 0124   HGBUR NEGATIVE 08/21/2023 1343   BILIRUBINUR NEGATIVE 08/21/2023 1343   BILIRUBINUR Negative 08/11/2013 0124   KETONESUR NEGATIVE 08/21/2023 1343   PROTEINUR NEGATIVE 08/21/2023 1343   NITRITE NEGATIVE 08/21/2023 1343   LEUKOCYTESUR NEGATIVE 08/21/2023 1343   LEUKOCYTESUR Negative 08/11/2013 0124   Sepsis Labs: @LABRCNTIP (procalcitonin:4,lacticidven:4)  ) Recent Results (from the past 240 hours)  Resp panel by RT-PCR (RSV, Flu A&B, Covid) Anterior Nasal Swab     Status: Abnormal   Collection Time: 08/21/23  9:24 AM   Specimen: Anterior Nasal Swab  Result Value Ref Range Status   SARS Coronavirus 2 by RT PCR POSITIVE (A) NEGATIVE Final    Comment: (NOTE) SARS-CoV-2 target nucleic acids are DETECTED.  The SARS-CoV-2 RNA is generally detectable in upper respiratory specimens during the acute phase of infection. Positive results are indicative of the presence of the identified virus, but do not rule out bacterial infection or co-infection with other pathogens not detected by the test. Clinical correlation with patient history and other diagnostic information is necessary to determine patient infection status. The expected result is Negative.  Fact  Sheet for Patients: bloggercourse.com  Fact Sheet for Healthcare Providers: seriousbroker.it  This test is not yet approved or cleared by the [place]  FDA and  has been authorized for detection and/or diagnosis of SARS-CoV-2 by FDA under an Emergency Use Authorization (EUA).  This EUA will remain in effect (meaning this test can be used) for the duration of  the COVID-19 declaration under Section 564(b)(1) of the A ct, 21 U.S.C. section 360bbb-3(b)(1), unless the authorization is terminated or revoked sooner.     Influenza A by PCR NEGATIVE NEGATIVE Final   Influenza B by PCR NEGATIVE NEGATIVE Final    Comment: (NOTE) The Xpert Xpress SARS-CoV-2/FLU/RSV plus  assay is intended as an aid in the diagnosis of influenza from Nasopharyngeal swab specimens and should not be used as a sole basis for treatment. Nasal washings and aspirates are unacceptable for Xpert Xpress SARS-CoV-2/FLU/RSV testing.  Fact Sheet for Patients: bloggercourse.com  Fact Sheet for Healthcare Providers: seriousbroker.it  This test is not yet approved or cleared by the [place]  FDA and has been authorized for detection and/or diagnosis of SARS-CoV-2 by FDA under an Emergency Use Authorization (EUA). This EUA will remain in effect (meaning this test can be used) for the duration of the COVID-19 declaration under Section 564(b)(1) of the Act, 21 U.S.C. section 360bbb-3(b)(1), unless the authorization is terminated or revoked.     Resp Syncytial Virus by PCR NEGATIVE NEGATIVE Final    Comment: (NOTE) Fact Sheet for Patients: bloggercourse.com  Fact Sheet for Healthcare Providers: seriousbroker.it  This test is not yet approved or cleared by the [place]  FDA and has been authorized for detection and/or diagnosis of SARS-CoV-2 by FDA under an Emergency Use Authorization (EUA). This EUA will remain in effect (meaning this test can be used) for the duration of the COVID-19 declaration under Section 564(b)(1) of the Act, 21 U.S.C. section 360bbb-3(b)(1), unless the authorization is terminated or revoked.  Performed at [facility], [address], [place], [place] [address]          Radiology Studies: CT Angio Chest PE W and/or Wo Contrast Result Date: 08/21/2023 CLINICAL DATA:  Hypoxia, shortness of breath and elevated D-dimer. Fever and productive cough. EXAM: CT ANGIOGRAPHY CHEST WITH CONTRAST TECHNIQUE: Multidetector CT imaging of the chest was performed using the standard protocol during bolus administration of intravenous contrast.  Multiplanar CT image reconstructions and MIPs were obtained to evaluate the vascular anatomy. RADIATION DOSE REDUCTION: This exam was performed according to the departmental dose-optimization program which includes automated exposure control, adjustment of the mA and/or kV according to patient size and/or use of iterative reconstruction technique. CONTRAST:  75mL OMNIPAQUE  IOHEXOL  350 MG/ML SOLN COMPARISON:  06/28/2023 FINDINGS: Cardiovascular: Stable enlarged heart. Normally opacified pulmonary arteries with no pulmonary arterial filling defects seen. Common trunk of the left common carotid artery and the right innominate artery. Mediastinum/Nodes: Interval enlarged subcarinal and right hilar lymph nodes including a subcarinal node with a short axis diameter of 16 mm on image number 134/5 and right hilar node with a short axis diameter of 18 mm on image number 129/5. Unremarkable thyroid  gland, esophagus and trachea. Lungs/Pleura: Interval multiple areas of patchy airspace opacity in the right upper lobe and to a lesser degree in the right lower lobe and left upper lobe. Mild bilateral lower lobe linear atelectasis. No pleural fluid. Upper Abdomen: Cholecystectomy clips. 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 Review of the MIP images confirms the above findings. IMPRESSION:  1. No pulmonary embolism. 2. Interval multifocal pneumonia. 3. Interval enlarged subcarinal and right hilar lymph nodes compatible with reactive nodes related to the patient's pneumonia. 4. Stable cardiomegaly. Electronically Signed   By: Elspeth Bathe M.D.   On: 08/21/2023 17:29        Scheduled Meds:  amLODipine   10 mg Oral Daily   atorvastatin   20 mg Oral Daily   budesonide   2 puff Inhalation BID   dexamethasone   6 mg Oral Daily   enoxaparin  (LOVENOX ) injection  60 mg Subcutaneous Q24H   insulin  aspart  0-20 Units Subcutaneous TID WC   insulin  glargine-yfgn  5 Units Subcutaneous  QHS   Ipratropium-Albuterol   1 puff Inhalation Q6H   polyethylene glycol  34 g Oral Daily   rOPINIRole   2 mg Oral BID   sodium chloride  flush  3 mL Intravenous Q12H   Continuous Infusions:   LOS: 2 days     Devaughn KATHEE Ban, MD [facility]   If 7PM-7AM, please contact night-coverage [email or web address] Password [facility] 08/23/2023, 12:57 PM

## 2023-08-24 ENCOUNTER — Institutional Professional Consult (permissible substitution): Payer: 59 | Admitting: Pulmonary Disease

## 2023-08-24 ENCOUNTER — Inpatient Hospital Stay: Payer: 59

## 2023-08-24 DIAGNOSIS — J1282 Pneumonia due to coronavirus disease 2019: Secondary | ICD-10-CM

## 2023-08-24 DIAGNOSIS — U071 COVID-19: Secondary | ICD-10-CM

## 2023-08-24 LAB — BASIC METABOLIC PANEL
Anion gap: 10 (ref 5–15)
BUN: 21 mg/dL (ref 8–23)
CO2: 29 mmol/L (ref 22–32)
Calcium: 8.8 mg/dL — ABNORMAL LOW (ref 8.9–10.3)
Chloride: 100 mmol/L (ref 98–111)
Creatinine, Ser: 0.87 mg/dL (ref 0.44–1.00)
GFR, Estimated: >60 mL/min (ref >60)
Glucose, Bld: 215 mg/dL — ABNORMAL HIGH (ref 70–99)
Potassium: 4.4 mmol/L (ref 3.5–5.1)
Sodium: 139 mmol/L (ref 135–145)

## 2023-08-24 LAB — GLUCOSE, CAPILLARY: Glucose-Capillary: 201 mg/dL — ABNORMAL HIGH (ref 70–99)

## 2023-08-24 LAB — HEMOGLOBIN A1C
Hgb A1c MFr Bld: 8.7 % — ABNORMAL HIGH (ref 4.8–5.6)
Mean Plasma Glucose: 203 mg/dL

## 2023-08-24 MED ORDER — PREDNISONE 10 MG PO TABS
40.0000 mg | ORAL_TABLET | Freq: Every day | ORAL | 0 refills | Status: AC
Start: 2023-08-24 — End: 2023-08-27

## 2023-08-24 MED ORDER — HYDROCOD POLI-CHLORPHE POLI ER 10-8 MG/5ML PO SUER: 5.0000 mL | Freq: Two times a day (BID) | ORAL | 0 refills | Status: AC

## 2023-08-24 MED ORDER — LORAZEPAM 2 MG/ML IJ SOLN
1.0000 mg | Freq: Once | INTRAMUSCULAR | Status: AC | PRN
  Administered 2023-08-24: 1 mg via INTRAVENOUS
  Filled 2023-08-24: qty 1

## 2023-08-24 NOTE — Discharge Summary (Signed)
 Kristina Cruz FMW:[id number] DOB: 1960-10-06 DOA: 08/21/2023  PCP: System, Provider Not In  Admit date: 08/21/2023 Discharge date: 08/24/2023  Time spent: 35 minutes  Recommendations for Outpatient Follow-up:  Pcp f/u Mri of pancreas     Discharge Diagnoses:  Principal Problem:   Pneumonia due to COVID-19 virus Active Problems:   Acute hypoxic respiratory failure (HCC)   Asthma exacerbation   Pancreatic mass   Morbid obesity (HCC)   Hypertension   Uncontrolled type 2 diabetes mellitus with hyperglycemia, without long-term current use of insulin  [facility])   Discharge Condition: improved  Diet recommendation: heart healthy  Filed Weights   08/21/23 0854 08/22/23 0131  Weight: 131.7 kg 132 kg    History of present illness:  From admission h and p Kristina Cruz is a 63 y.o. female with medical history significant of moderate persistent asthma, type 2 diabetes, morbid obesity, hypertension, who presents to the ED due to shortness of breath and abdominal pain.   [name] is states that her cough has been persistent and productive for at least 6 weeks now, however she has noticed a significant worsening.  She also endorses shortness of breath that has been worsening.  She notes that several days ago, she developed new onset fevers that have been recurrent.  She denies any chest pain palpitations, or lower extremity swelling.  She endorses right-sided abdominal pain that started approximately 6 weeks ago.  She was reluctant to seek medical care regarding this, however her grandson heard her crying at night due to the pain.  She denies any nausea, vomiting.  Hospital Course:  Patient presents with shortness of breath. Found to have covid infection with asthma exacerbation and acute hypoxic respiratory failure. Treated with decadron  and oxygen. Symptomatically improved, now weaned off oxygen. PT advises home health which was ordered. Also complained of abdominal pain, CT showed  possible pancreatic mass. MRI advised to further characterize. MRI performed but patient unable to hold still long enough to obtain adequate visualization of the mass. LFTs and lipase normal, no signs obstruction. Will need outpatient MRI (order placed). Reviewed this plan with Dr. Jinny of GI who agrees. Abdominal pain resolved at time of discharge and patient tolerating her diet. Will discharge with several more days prednisone  for asthma exacerbation, advise close pcp f/u.   Procedures: none   Consultations: none  Discharge Exam: Vitals:   08/24/23 0527 08/24/23 0832  BP: (!) 142/85 136/79  Pulse: 63 (!) 54  Resp: 17   Temp: 97.9 F (36.6 C) 98.1 F (36.7 C)  SpO2: 99% 98%    General exam: Appears calm and comfortable  Respiratory system: decreased air entry, scattered rhonchi, wheeze resolved Cardiovascular system: S1 & S2 heard, RRR.    Gastrointestinal system: Abdomen is obese, soft and nontender.   Central nervous system: Alert and oriented. No focal neurological deficits. Extremities: Symmetric 5 x 5 power. Skin: No rashes, lesions or ulcers. Trace LE edema Psychiatry: Judgement and insight appear normal. Mood & affect appropriate.   Discharge Instructions   Discharge Instructions     Diet - low sodium heart healthy   Complete by: As directed    Increase activity slowly   Complete by: As directed       Allergies as of 08/24/2023       Reactions   Peanut-containing Drug Products         Medication List     TAKE these medications    acetaminophen  650 MG CR tablet Commonly known as: Tylenol   8 Hour Arthritis Pain Take 3 tablets (1,950 mg total) by mouth every 8 (eight) hours as needed for pain. Do not exceed 4000 mg total in a day.  Home med.   amLODipine  10 MG tablet Commonly known as: NORVASC  Take 10 mg by mouth daily.   atorvastatin  20 MG tablet Commonly known as: LIPITOR Take 20 mg by mouth daily.   baclofen  10 MG tablet Commonly known as:  LIORESAL  Take 10 mg by mouth 3 (three) times daily.   chlorpheniramine-HYDROcodone  10-8 MG/5ML Commonly known as: TUSSIONEX Take 5 mLs by mouth 2 (two) times daily for 5 days.   diphenhydrAMINE  50 MG tablet Commonly known as: BENADRYL  Take 1 tablet (50 mg total) by mouth every 6 (six) hours as needed for itching (swelling).   EPINEPHrine  0.3 mg/0.3 mL Soaj injection Commonly known as: EPI-PEN Inject 0.3 mg into the muscle as needed for anaphylaxis.   fluticasone  furoate-vilanterol 100-25 MCG/ACT Aepb Commonly known as: Breo Ellipta  Inhale 1 puff into the lungs daily.   furosemide  40 MG tablet Commonly known as: LASIX  Take 40 mg by mouth daily.   gabapentin  300 MG capsule Commonly known as: NEURONTIN  Take 600 mg by mouth 2 (two) times daily.   guaiFENesin  600 MG 12 hr tablet Commonly known as: MUCINEX  Take 1 tablet (600 mg total) by mouth 2 (two) times daily as needed for cough or to loosen phlegm.   ipratropium-albuterol  0.5-2.5 (3) MG/3ML Soln Commonly known as: DUONEB Take 3 mLs by nebulization every 4 (four) hours as needed (wheezing / shortness of breath).   Melatonin 10 MG Tabs Take 10 mg by mouth at bedtime as needed.   metFORMIN  1000 MG tablet Commonly known as: GLUCOPHAGE  Take 1,000 mg by mouth 2 (two) times daily.   Ozempic (0.25 or 0.5 MG/DOSE) 2 MG/3ML Sopn Generic drug: Semaglutide(0.25 or 0.5MG /DOS) Inject into the skin.   predniSONE  10 MG tablet Commonly known as: DELTASONE  Take 4 tablets (40 mg total) by mouth daily for 3 days.   rOPINIRole  2 MG tablet Commonly known as: REQUIP  Take 2 mg by mouth in the morning and at bedtime.   sodium chloride  0.65 % Soln nasal spray Commonly known as: OCEAN Place 1 spray into both nostrils as needed for up to 5 days for congestion.   Ventolin  HFA 108 (90 Base) MCG/ACT inhaler Generic drug: albuterol  Inhale 2 puffs into the lungs every 4 (four) hours as needed for wheezing or shortness of breath.        Allergies  Allergen Reactions   Peanut-Containing Drug Products       The results of significant diagnostics from this hospitalization (including imaging, microbiology, ancillary and laboratory) are listed below for reference.    Significant Diagnostic Studies: MR ABDOMEN WO CONTRAST Result Date: 08/24/2023 CLINICAL DATA:  Pancreatic mass seen on prior CT scan. EXAM: MRI ABDOMEN WITHOUT CONTRAST TECHNIQUE: Multiplanar multisequence MR imaging was performed without the administration of intravenous contrast. COMPARISON:  CT scan abdomen and pelvis from 08/21/2023. FINDINGS: Examination is moderately limited due to patient's motion during data acquisition and technique. Intravenous contrast was not administered which limits evaluation for suspected pancreatic tumor. Also, MRCP images were not provided. Only coronal T2 haste, axial true FISP, in/out of phase images and diffusion/ADC images are submitted for review. Lower chest: Unremarkable MR appearance to the lung bases. No pleural effusion. No pericardial effusion. Normal heart size. Hepatobiliary: The liver is normal in size and noncirrhotic in configuration. There is diffuse heterogeneous signal intensity of the liver.  There is mild diffuse hepatic steatosis. There are several ill-defined, predominantly peripheral T1 hypointense and T2 hyperintense areas in the right lobe, which do not exhibit diffusion restriction. These are incompletely characterized on this limited exam. No intrahepatic or extrahepatic bile duct dilatation. No choledocholithiasis. Status post cholecystectomy. Pancreas: There is a heterogeneous masslike thickening of the pancreatic body/proximal tail measuring 4.0 x 6.5 cm orthogonally on axial plane. There is subtle prominence of main pancreatic duct in the remaining tail region. There is no abnormal diffusion restriction in this lesion on high B value images. The lesion remains indeterminate on this limited exam. Further  evaluation with nonemergent MRI abdomen with intravenous contrast as per pancreatic mass protocol is recommended preferably as an outpatient, when patient is in better condition and can hold breath for longer duration. Spleen:  Within normal limits in size and appearance. No focal mass. Adrenals/Urinary Tract: Unremarkable adrenal glands. No hydroureteronephrosis. No suspicious renal mass. Stomach/Bowel: Visualized portions within the abdomen are unremarkable. No disproportionate dilation of bowel loops. Vascular/Lymphatic: No pathologically enlarged lymph nodes identified. No abdominal aortic aneurysm demonstrated. No ascites. Other:  None. Musculoskeletal: No suspicious bone lesions identified. IMPRESSION: 1. Moderately suboptimal unenhanced exam. 2. Heterogeneous masslike thickening of the pancreatic body/tail again seen. The lesion remains indeterminate on this exam. Differential diagnosis includes pancreatic tumor, autoimmune pancreatitis, mass forming chronic pancreatitis, etc. Please see above for follow-up recommendations. 3. Mild diffuse hepatic steatosis. There are indeterminate areas in the liver, which can also be better evaluated on the contrast-enhanced MRI abdomen. 4. Other observations, as described above. Electronically Signed   By: Ree Molt M.D.   On: 08/24/2023 09:33   CT Angio Chest PE W and/or Wo Contrast Result Date: 08/21/2023 CLINICAL DATA:  Hypoxia, shortness of breath and elevated D-dimer. Fever and productive cough. EXAM: CT ANGIOGRAPHY CHEST WITH CONTRAST TECHNIQUE: Multidetector CT imaging of the chest was performed using the standard protocol during bolus administration of intravenous contrast. Multiplanar CT image reconstructions and MIPs were obtained to evaluate the vascular anatomy. RADIATION DOSE REDUCTION: This exam was performed according to the departmental dose-optimization program which includes automated exposure control, adjustment of the mA and/or kV according to  patient size and/or use of iterative reconstruction technique. CONTRAST:  75mL OMNIPAQUE  IOHEXOL  350 MG/ML SOLN COMPARISON:  06/28/2023 FINDINGS: Cardiovascular: Stable enlarged heart. Normally opacified pulmonary arteries with no pulmonary arterial filling defects seen. Common trunk of the left common carotid artery and the right innominate artery. Mediastinum/Nodes: Interval enlarged subcarinal and right hilar lymph nodes including a subcarinal node with a short axis diameter of 16 mm on image number 134/5 and right hilar node with a short axis diameter of 18 mm on image number 129/5. Unremarkable thyroid  gland, esophagus and trachea. Lungs/Pleura: Interval multiple areas of patchy airspace opacity in the right upper lobe and to a lesser degree in the right lower lobe and left upper lobe. Mild bilateral lower lobe linear atelectasis. No pleural fluid. Upper Abdomen: Cholecystectomy clips. 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 Review of the MIP images confirms the above findings. IMPRESSION: 1. No pulmonary embolism. 2. Interval multifocal pneumonia. 3. Interval enlarged subcarinal and right hilar lymph nodes compatible with reactive nodes related to the patient's pneumonia. 4. Stable cardiomegaly. Electronically Signed   By: Elspeth Bathe M.D.   On: 08/21/2023 17:29   CT ABDOMEN PELVIS W CONTRAST Result Date: 08/21/2023 CLINICAL DATA:  Abdominal pain, most severe in the right lower and left lower quadrants with associated  nausea, vomiting and diarrhea. EXAM: CT ABDOMEN AND PELVIS WITH CONTRAST TECHNIQUE: Multidetector CT imaging of the abdomen and pelvis was performed using the standard protocol following bolus administration of intravenous contrast. RADIATION DOSE REDUCTION: This exam was performed according to the departmental dose-optimization program which includes automated exposure control, adjustment of the mA and/or kV according to patient size and/or use  of iterative reconstruction technique. CONTRAST:  OMNIPAQUE  IOHEXOL  300 MG/ML  SOLN COMPARISON:  10/16/2022 FINDINGS: Comment: Exam detail is diminished due to motion artifact predominantly affecting the upper abdominal structures. Lower chest: Patchy ground-glass and airspace densities identified within both lower lobes. These are new compared with the previous exam and are concerning for underlying inflammatory/infectious process. Hepatobiliary: No suspicious liver abnormality. Status post cholecystectomy. No biliary ductal dilatation. Pancreas: Masslike thickening involving the proximal and mid tail of pancreas with relative hypoenhancement measures approximately 3.9 x 5.5 cm, image 6/7. In the absence of signs/symptoms of acute pancreatitis underlying neoplastic process cannot be excluded. Spleen: Normal in size without focal abnormality. Adrenals/Urinary Tract: Normal appearance of the adrenal glands. The kidneys are normal. No nephrolithiasis or hydronephrosis. Urinary bladder is unremarkable. Stomach/Bowel: Stomach appears nondistended. There is no dilated loops of large or small bowel to suggest obstruction. No bowel wall thickening or inflammation. Sigmoid diverticulosis without signs of acute diverticulitis. Moderate retained stool identified within the colon. Vascular/Lymphatic: Normal caliber of the abdominal aorta. No signs of abdominopelvic adenopathy. Reproductive: Status post hysterectomy. No adnexal masses. Other: No free fluid or fluid collections. Marked diastasis recti with ventral herniation of the large and small bowel loops. Periumbilical hernia is containing fat only. No free fluid or fluid collections. Musculoskeletal: Severe degenerative changes involving both hips and both SI joints. Multilevel lumbar spondylosis. No acute or suspicious osseous findings. IMPRESSION: 1. Masslike thickening involving the proximal and mid tail of pancreas with relative hypoenhancement measures  approximately 3.9 x 5.5 cm. In the absence of signs/symptoms of acute pancreatitis underlying neoplastic process cannot be excluded. Further evaluation with nonemergent contrast enhanced MRI of the abdomen is advised. Note: Given patient's body habitus and the motion artifact observed on the current exam an MRI obtained at this time is likely to be severely limited and likely nondiagnostic. MRI should be obtained only once the patient is clinically stable, and is able to remain motionless and breath hold. 2. Patchy ground-glass and airspace densities identified within both lower lobes. These are new compared with the previous exam and are concerning for underlying inflammatory/infectious process. 3. Sigmoid diverticulosis without signs of acute diverticulitis. 4. Marked diastasis recti with ventral herniation of the large and small bowel loops. 5. Periumbilical hernia is containing fat only. Electronically Signed   By: Waddell Calk M.D.   On: 08/21/2023 12:05   DG Chest 2 View Result Date: 08/21/2023 CLINICAL DATA:  Shortness of breath. EXAM: CHEST - 2 VIEW COMPARISON:  06/28/2023. FINDINGS: Low lung volume. There is mild-to-moderate pulmonary edema, slightly accentuated by low lung volume. There is nonspecific heterogeneous opacity overlying the right lower lung zone, which may represent atelectasis and/or pneumonia. Bilateral lung fields are otherwise clear. No acute consolidation or lung collapse. Bilateral costophrenic angles are clear. Stable cardio-mediastinal silhouette. No acute osseous abnormalities. The soft tissues are within normal limits. IMPRESSION: 1. Mild pulmonary edema, slightly accentuated by low lung volume. 2. Nonspecific heterogeneous opacities overlying the right lower lung zone may represent atelectasis and/or pneumonia. Correlate clinically. Electronically Signed   By: Ree Molt M.D.   On: 08/21/2023 10:12  Microbiology: Recent Results (from the past 240 hours)  Resp panel by  RT-PCR (RSV, Flu A&B, Covid) Anterior Nasal Swab     Status: Abnormal   Collection Time: 08/21/23  9:24 AM   Specimen: Anterior Nasal Swab  Result Value Ref Range Status   SARS Coronavirus 2 by RT PCR POSITIVE (A) NEGATIVE Final    Comment: (NOTE) SARS-CoV-2 target nucleic acids are DETECTED.  The SARS-CoV-2 RNA is generally detectable in upper respiratory specimens during the acute phase of infection. Positive results are indicative of the presence of the identified virus, but do not rule out bacterial infection or co-infection with other pathogens not detected by the test. Clinical correlation with patient history and other diagnostic information is necessary to determine patient infection status. The expected result is Negative.  Fact Sheet for Patients: bloggercourse.com  Fact Sheet for Healthcare Providers: seriousbroker.it  This test is not yet approved or cleared by the [place]  FDA and  has been authorized for detection and/or diagnosis of SARS-CoV-2 by FDA under an Emergency Use Authorization (EUA).  This EUA will remain in effect (meaning this test can be used) for the duration of  the COVID-19 declaration under Section 564(b)(1) of the A ct, 21 U.S.C. section 360bbb-3(b)(1), unless the authorization is terminated or revoked sooner.     Influenza A by PCR NEGATIVE NEGATIVE Final   Influenza B by PCR NEGATIVE NEGATIVE Final    Comment: (NOTE) The Xpert Xpress SARS-CoV-2/FLU/RSV plus assay is intended as an aid in the diagnosis of influenza from Nasopharyngeal swab specimens and should not be used as a sole basis for treatment. Nasal washings and aspirates are unacceptable for Xpert Xpress SARS-CoV-2/FLU/RSV testing.  Fact Sheet for Patients: bloggercourse.com  Fact Sheet for Healthcare Providers: seriousbroker.it  This test is not yet approved or cleared by the  [place]  FDA and has been authorized for detection and/or diagnosis of SARS-CoV-2 by FDA under an Emergency Use Authorization (EUA). This EUA will remain in effect (meaning this test can be used) for the duration of the COVID-19 declaration under Section 564(b)(1) of the Act, 21 U.S.C. section 360bbb-3(b)(1), unless the authorization is terminated or revoked.     Resp Syncytial Virus by PCR NEGATIVE NEGATIVE Final    Comment: (NOTE) Fact Sheet for Patients: bloggercourse.com  Fact Sheet for Healthcare Providers: seriousbroker.it  This test is not yet approved or cleared by the [place]  FDA and has been authorized for detection and/or diagnosis of SARS-CoV-2 by FDA under an Emergency Use Authorization (EUA). This EUA will remain in effect (meaning this test can be used) for the duration of the COVID-19 declaration under Section 564(b)(1) of the Act, 21 U.S.C. section 360bbb-3(b)(1), unless the authorization is terminated or revoked.  Performed at [facility], [address] [place], [place] [address]      Labs: Basic Metabolic Panel: Recent Labs  Lab 08/21/23 [phone] 08/22/23 0526 08/23/23 0646 08/24/23 0559  NA 140 139 137 139  K 3.5 4.4 4.0 4.4  CL 101 101 97* 100  CO2 28 28 29 29   GLUCOSE 180* 200* 254* 215*  BUN 11 11 18 21   CREATININE 0.79 0.79 0.78 0.87  CALCIUM  9.0 9.1 8.7* 8.8*   Liver Function Tests: Recent Labs  Lab 08/21/23 0924  AST 12*  ALT 18  ALKPHOS 95  BILITOT 0.7  PROT 8.2*  ALBUMIN  3.6   Recent Labs  Lab 08/21/23 0924  LIPASE 19   No results for input(s): AMMONIA in the last  168 hours. CBC: Recent Labs  Lab 08/21/23 0924 08/22/23 0526  WBC 11.4* 10.9*  HGB 12.9 12.2  HCT 40.0 38.2  MCV 87.5 85.8  PLT 408* 412*   Cardiac Enzymes: No results for input(s): CKTOTAL, CKMB, CKMBINDEX, TROPONINI in the last 168 hours. BNP: BNP (last 3 results) Recent  Labs    02/10/23 1407 04/18/23 0045 08/21/23 0924  BNP 13.6 23.1 37.7    ProBNP (last 3 results) No results for input(s): PROBNP in the last 8760 hours.  CBG: Recent Labs  Lab 08/23/23 0826 08/23/23 1208 08/23/23 1700 08/23/23 2157 08/24/23 0828  GLUCAP 226* 125* 296* 367* 201*       Signed:  Devaughn KATHEE Ban MD.  [facility] 08/24/2023, 10:34 AM

## 2023-08-24 NOTE — TOC Transition Note (Addendum)
 Transition of Care [facility]) - Discharge Note   Patient Details  Name: Kristina Cruz MRN: [id number] Date of Birth: 04/02/1961  [facility]) CM/SW Contact:  Lauraine JAYSON Carpen, LCSW Phone Number: 08/24/2023, 11:28 AM   Clinical Narrative:  Patient has orders to discharge home today. CSW left message for [facility] liaison to notify. SDOH flag for transportation. Added resources to AVS. No further concerns. CSW signing off.   Final next level of care: Home w Home Health Services Barriers to Discharge: Barriers Resolved   Patient Goals and CMS Choice Patient states their goals for this hospitalization and ongoing recovery are:: home with home health CMS Medicare.gov Compare Post Acute Care list provided to:: Patient Choice offered to / list presented to : Patient      Discharge Placement                    Patient and family notified of of transfer: 08/24/23  Discharge Plan and Services Additional resources added to the After Visit Summary for                            [facility] Arranged: PT, Nurse's Aide [facility] Agency: [facility] Health Care Date [facility] Agency Contacted: 08/24/23   Representative spoke with at [facility] Agency: Darleene Gowda  Social Drivers of Health (SDOH) Interventions SDOH Screenings   Food Insecurity: No Food Insecurity (08/22/2023)  Housing: Low Risk  (08/22/2023)  Recent Concern: Housing - High Risk (08/22/2023)  Transportation Needs: Unmet Transportation Needs (08/22/2023)  Utilities: Not At Risk (08/22/2023)  Financial Resource Strain: Low Risk  (09/26/2022)   Received from [facility], [facility]  Social Connections: Patient Declined (08/22/2023)  Tobacco Use: Low Risk  (08/22/2023)     Readmission Risk Interventions    08/23/2023    4:39 PM  Readmission Risk Prevention Plan  Transportation Screening Complete  PCP or Specialist Appt within 5-7 Days Complete  Home Care Screening Complete  Medication Review  (RN CM) Complete

## 2023-08-24 NOTE — Discharge Instructions (Signed)
 Transportation Resources  Agency Name: [facility] Agency Address: [address] [place], [place] [address] Phone: [phone] Email: [email or web address] Website: [email or web address] Service(s) Offered: Housing services, self-sufficiency, congregate meal program, weatherization program, Field seismologist program, emergency food assistance,  housing counseling, home ownership program, wheels-towork program.  Agency Name: [facility] [employer] [phone]) Address: [address], [place], [place] [address] Phone: [phone] Website: [email or web address] Service(s) Offered: Transportation for [employer], subscription and demand response; Dial-a-Ride for citizens 65 years of age or older.  Agency Name: [facility] [employer] [facility] Address: [address] [place], [place] [address] Phone: [phone] Service(s) Offered: Child support services; child welfare services; food stamps; Medicaid; work first family assistance; and aid with fuel,  rent, food and medicine, transportation assistance.  Agency Name: Disabled [employer] (DAV) Transportation  Network Phone: [phone] Service(s) Offered: Transports veterans to the [facility] medical center. Call  forty-eight hours in advance and leave the name, telephone  number, date, and time of appointment. Veteran will be  contacted by the driver the day before the appointment to  arrange a pick up point   Transportation Resources  Agency Name: [facility] Agency Address: [address] [place], [place] [address] Phone: [phone] Email: [email or web address] Website: [email or web address] Service(s) Offered: Housing services, self-sufficiency, congregate meal program, weatherization program, Field seismologist program, emergency food assistance,  housing counseling, home ownership program, wheels-towork  program.  Agency Name: [facility] [employer] [phone]) Address: [address], [place], [place] [address] Phone: [phone] Website: [email or web address] Service(s) Offered: Transportation for [employer], subscription and demand response; Dial-a-Ride for citizens 65 years of age or older.  Agency Name: Department of Social Services Address: [address] [place], [place] [address] Phone: [phone] Service(s) Offered: Child support services; child welfare services; food stamps; Medicaid; work first family assistance; and aid with fuel,  rent, food and medicine, transportation assistance.  Agency Name: Disabled [employer] (DAV) Transportation  Network Phone: [phone] Service(s) Offered: Transports veterans to the [facility] medical center. Call  forty-eight hours in advance and leave the name, telephone  number, date, and time of appointment. Veteran will be  contacted by the driver the day before the appointment to  arrange a pick up point    [employer] currently provides door to door services. ACTA connects with PART daily for services to [facility]. ACTA also performs contract services to [employer] operates 27 vehicles, all but 3 mini-vans are equipped with lifts for special needs as well as the general public. ACTA drivers are each CDL certified and trained in First Aid and CPR. ACT[employer] was established in 2002 by [employer]. An independent Industrial/product designer. ACTA operates via Cytogeneticist with required Research scientist (physical sciences) from [place]. ACT[employer] provides over 80,000 passenger trips each year, including [facility] and [employer] sites.  Call at least by 11 AM one business day prior to needing transportation  [employer].                      [place], [place] [address]     Office  Hours: Monday-Friday  8 AM - 5 PM

## 2023-08-24 NOTE — Progress Notes (Signed)
 Gave to RN to give to pt

## 2023-08-24 NOTE — Care Management Important Message (Signed)
 Important Message  Patient Details  Name: Kristina Cruz MRN: [id number] Date of Birth: [birth year]   Important Message Given:  Yes - Medicare IM     Derren Suydam W, CMA 08/24/2023, 11:33 AM

## 2023-08-24 NOTE — Plan of Care (Signed)
   Problem: Education: Goal: Knowledge of risk factors and measures for prevention of condition will improve Outcome: Progressing   Problem: Coping: Goal: Psychosocial and spiritual needs will be supported Outcome: Progressing   Problem: Respiratory: Goal: Will maintain a patent airway Outcome: Progressing Goal: Complications related to the disease process, condition or treatment will be avoided or minimized Outcome: Progressing   Problem: Education: Goal: Ability to describe self-care measures that may prevent or decrease complications (Diabetes Survival Skills Education) will improve Outcome: Progressing Goal: Individualized Educational Video(s) Outcome: Progressing   Problem: Coping: Goal: Ability to adjust to condition or change in health will improve Outcome: Progressing   Problem: Fluid Volume: Goal: Ability to maintain a balanced intake and output will improve Outcome: Progressing   Problem: Health Behavior/Discharge Planning: Goal: Ability to identify and utilize available resources and services will improve Outcome: Progressing Goal: Ability to manage health-related needs will improve Outcome: Progressing   Problem: Metabolic: Goal: Ability to maintain appropriate glucose levels will improve Outcome: Progressing   Problem: Nutritional: Goal: Maintenance of adequate nutrition will improve Outcome: Progressing Goal: Progress toward achieving an optimal weight will improve Outcome: Progressing   Problem: Skin Integrity: Goal: Risk for impaired skin integrity will decrease Outcome: Progressing   Problem: Tissue Perfusion: Goal: Adequacy of tissue perfusion will improve Outcome: Progressing   Problem: Education: Goal: Knowledge of General Education information will improve Description: Including pain rating scale, medication(s)/side effects and non-pharmacologic comfort measures Outcome: Progressing   Problem: Health Behavior/Discharge Planning: Goal:  Ability to manage health-related needs will improve Outcome: Progressing   Problem: Clinical Measurements: Goal: Ability to maintain clinical measurements within normal limits will improve Outcome: Progressing Goal: Will remain free from infection Outcome: Progressing Goal: Diagnostic test results will improve Outcome: Progressing Goal: Respiratory complications will improve Outcome: Progressing Goal: Cardiovascular complication will be avoided Outcome: Progressing   Problem: Activity: Goal: Risk for activity intolerance will decrease Outcome: Progressing   Problem: Nutrition: Goal: Adequate nutrition will be maintained Outcome: Progressing   Problem: Coping: Goal: Level of anxiety will decrease Outcome: Progressing   Problem: Elimination: Goal: Will not experience complications related to bowel motility Outcome: Progressing Goal: Will not experience complications related to urinary retention Outcome: Progressing   Problem: Pain Management: Goal: General experience of comfort will improve Outcome: Progressing   Problem: Safety: Goal: Ability to remain free from injury will improve Outcome: Progressing   Problem: Skin Integrity: Goal: Risk for impaired skin integrity will decrease Outcome: Progressing

## 2023-08-24 NOTE — Inpatient Diabetes Management (Signed)
 Inpatient Diabetes Program Recommendations  AACE/ADA: New Consensus Statement on Inpatient Glycemic Control Target Ranges:  Prepandial:   less than 140 mg/dL      Peak postprandial:   less than 180 mg/dL (1-2 hours)      Critically ill patients:  140 - 180 mg/dL    Latest Reference Range & Units 08/23/23 08:26 08/23/23 12:08 08/23/23 17:00 08/23/23 21:57 08/24/23 08:28  Glucose-Capillary 70 - 99 mg/dL 773 (H) 874 (H) 703 (H) 367 (H) 201 (H)   Review of Glycemic Control  Diabetes history: DM2 Outpatient Diabetes medications: Metformin  1000 mg BID, Ozempic Qweek Current orders for Inpatient glycemic control: Semglee  5 units QHS, Novolog  0-20 units TID with meals; Decadron  6 mg daily  Inpatient Diabetes Program Recommendations:    Insulin : If steroids are continued as ordered, please consider increasing Semglee  to 8 units at bedtime and adding Novolog  3 units TID with meals for meal coverage if patient eats at least 50% of meals.  Thanks, Earnie Gainer, RN, MSN, CDCES Diabetes Coordinator Inpatient Diabetes Program [phone] (Team Pager from 8am to 5pm)

## 2023-09-03 ENCOUNTER — Institutional Professional Consult (permissible substitution): Payer: 59 | Admitting: Internal Medicine

## 2023-09-10 ENCOUNTER — Telehealth: Payer: Self-pay

## 2023-09-10 NOTE — Telephone Encounter (Signed)
The patient called in because she got a text and don't know the department. She said her PCP wants her to have a MRI done. I transfer here to the main number.

## 2023-09-17 ENCOUNTER — Institutional Professional Consult (permissible substitution): Payer: 59 | Admitting: Internal Medicine

## 2023-09-17 ENCOUNTER — Ambulatory Visit: Payer: 59

## 2023-09-28 ENCOUNTER — Observation Stay
Admission: EM | Admit: 2023-09-28 | Discharge: 2023-09-29 | Disposition: A | Discharge status: Home / Self Care | Payer: 59 | Attending: Internal Medicine | Admitting: Internal Medicine

## 2023-09-28 ENCOUNTER — Emergency Department: Payer: 59

## 2023-09-28 ENCOUNTER — Ambulatory Visit: Admission: RE | Admit: 2023-09-28 | Payer: 59 | Source: Ambulatory Visit

## 2023-09-28 ENCOUNTER — Other Ambulatory Visit: Payer: Self-pay

## 2023-09-28 DIAGNOSIS — E119 Type 2 diabetes mellitus without complications: Secondary | ICD-10-CM | POA: Insufficient documentation

## 2023-09-28 DIAGNOSIS — Z91010 Allergy to peanuts: Secondary | ICD-10-CM | POA: Insufficient documentation

## 2023-09-28 DIAGNOSIS — J441 Chronic obstructive pulmonary disease with (acute) exacerbation: Principal | ICD-10-CM | POA: Insufficient documentation

## 2023-09-28 DIAGNOSIS — Z1152 Encounter for screening for COVID-19: Secondary | ICD-10-CM | POA: Diagnosis not present

## 2023-09-28 DIAGNOSIS — K8689 Other specified diseases of pancreas: Secondary | ICD-10-CM | POA: Diagnosis not present

## 2023-09-28 DIAGNOSIS — E1165 Type 2 diabetes mellitus with hyperglycemia: Secondary | ICD-10-CM

## 2023-09-28 DIAGNOSIS — E118 Type 2 diabetes mellitus with unspecified complications: Secondary | ICD-10-CM

## 2023-09-28 DIAGNOSIS — J45901 Unspecified asthma with (acute) exacerbation: Secondary | ICD-10-CM | POA: Diagnosis present

## 2023-09-28 DIAGNOSIS — Z794 Long term (current) use of insulin: Secondary | ICD-10-CM | POA: Insufficient documentation

## 2023-09-28 DIAGNOSIS — I10 Essential (primary) hypertension: Secondary | ICD-10-CM | POA: Diagnosis present

## 2023-09-28 DIAGNOSIS — Z7984 Long term (current) use of oral hypoglycemic drugs: Secondary | ICD-10-CM | POA: Insufficient documentation

## 2023-09-28 DIAGNOSIS — Z79899 Other long term (current) drug therapy: Secondary | ICD-10-CM | POA: Insufficient documentation

## 2023-09-28 DIAGNOSIS — R0602 Shortness of breath: Secondary | ICD-10-CM | POA: Diagnosis present

## 2023-09-28 DIAGNOSIS — J4541 Moderate persistent asthma with (acute) exacerbation: Secondary | ICD-10-CM | POA: Diagnosis not present

## 2023-09-28 DIAGNOSIS — E66813 Obesity, class 3: Secondary | ICD-10-CM | POA: Diagnosis present

## 2023-09-28 LAB — CBC WITH DIFFERENTIAL/PLATELET
Abs Immature Granulocytes: 0.03 10*3/uL (ref 0.00–0.07)
Basophils Absolute: 0 10*3/uL (ref 0.0–0.1)
Basophils Relative: 1 %
Eosinophils Absolute: 0.6 10*3/uL — ABNORMAL HIGH (ref 0.0–0.5)
Eosinophils Relative: 7 %
HCT: 38.2 % (ref 36.0–46.0)
Hemoglobin: 12.2 g/dL (ref 12.0–15.0)
Immature Granulocytes: 0 %
Lymphocytes Relative: 15 %
Lymphs Abs: 1.2 10*3/uL (ref 0.7–4.0)
MCH: 27.5 pg (ref 26.0–34.0)
MCHC: 31.9 g/dL (ref 30.0–36.0)
MCV: 86.2 fL (ref 80.0–100.0)
Monocytes Absolute: 0.5 10*3/uL (ref 0.1–1.0)
Monocytes Relative: 6 %
Neutro Abs: 5.7 10*3/uL (ref 1.7–7.7)
Neutrophils Relative %: 71 %
Platelets: 284 10*3/uL (ref 150–400)
RBC: 4.43 MIL/uL (ref 3.87–5.11)
RDW: 16 % — ABNORMAL HIGH (ref 11.5–15.5)
WBC: 8.1 10*3/uL (ref 4.0–10.5)
nRBC: 0 % (ref 0.0–0.2)

## 2023-09-28 LAB — RESPIRATORY PANEL BY PCR

## 2023-09-28 LAB — RESP PANEL BY RT-PCR (RSV, FLU A&B, COVID)  RVPGX2
Influenza A by PCR: NEGATIVE
Influenza B by PCR: NEGATIVE
Resp Syncytial Virus by PCR: NEGATIVE
SARS Coronavirus 2 by RT PCR: NEGATIVE

## 2023-09-28 LAB — BASIC METABOLIC PANEL
Anion gap: 14 (ref 5–15)
BUN: 15 mg/dL (ref 8–23)
CO2: 25 mmol/L (ref 22–32)
Calcium: 8.7 mg/dL — ABNORMAL LOW (ref 8.9–10.3)
Chloride: 103 mmol/L (ref 98–111)
Creatinine, Ser: 0.85 mg/dL (ref 0.44–1.00)
GFR, Estimated: >60 mL/min (ref >60)
Glucose, Bld: 264 mg/dL — ABNORMAL HIGH (ref 70–99)
Potassium: 3.8 mmol/L (ref 3.5–5.1)
Sodium: 142 mmol/L (ref 135–145)

## 2023-09-28 LAB — GLUCOSE, CAPILLARY
Glucose-Capillary: 279 mg/dL — ABNORMAL HIGH (ref 70–99)
Glucose-Capillary: 329 mg/dL — ABNORMAL HIGH (ref 70–99)

## 2023-09-28 LAB — CBG MONITORING, ED: Glucose-Capillary: 257 mg/dL — ABNORMAL HIGH (ref 70–99)

## 2023-09-28 MED ORDER — IPRATROPIUM-ALBUTEROL 0.5-2.5 (3) MG/3ML IN SOLN
3.0000 mL | Freq: Four times a day (QID) | RESPIRATORY_TRACT | Status: DC
  Administered 2023-09-28: 3 mL via RESPIRATORY_TRACT
  Filled 2023-09-28: qty 3

## 2023-09-28 MED ORDER — ENOXAPARIN SODIUM 60 MG/0.6ML IJ SOSY
60.0000 mg | PREFILLED_SYRINGE | INTRAMUSCULAR | Status: DC
  Administered 2023-09-28: 60 mg via SUBCUTANEOUS
  Filled 2023-09-28: qty 0.6

## 2023-09-28 MED ORDER — ACETAMINOPHEN 325 MG PO TABS
650.0000 mg | ORAL_TABLET | Freq: Four times a day (QID) | ORAL | Status: DC | PRN
  Administered 2023-09-28 – 2023-09-29 (×3): 650 mg via ORAL
  Filled 2023-09-28 (×3): qty 2

## 2023-09-28 MED ORDER — IPRATROPIUM-ALBUTEROL 0.5-2.5 (3) MG/3ML IN SOLN
3.0000 mL | Freq: Three times a day (TID) | RESPIRATORY_TRACT | Status: DC
  Administered 2023-09-29: 3 mL via RESPIRATORY_TRACT
  Filled 2023-09-28: qty 3

## 2023-09-28 MED ORDER — INSULIN ASPART 100 UNIT/ML IJ SOLN
0.0000 [IU] | Freq: Three times a day (TID) | INTRAMUSCULAR | Status: DC
  Administered 2023-09-28: 11 [IU] via SUBCUTANEOUS
  Administered 2023-09-29: 7 [IU] via SUBCUTANEOUS
  Administered 2023-09-29: 4 [IU] via SUBCUTANEOUS
  Filled 2023-09-28 (×3): qty 1

## 2023-09-28 MED ORDER — NAPROXEN 500 MG PO TABS
500.0000 mg | ORAL_TABLET | Freq: Once | ORAL | Status: AC
  Administered 2023-09-28: 500 mg via ORAL
  Filled 2023-09-28: qty 1

## 2023-09-28 MED ORDER — PREDNISONE 20 MG PO TABS
60.0000 mg | ORAL_TABLET | Freq: Once | ORAL | Status: AC
  Administered 2023-09-28: 60 mg via ORAL
  Filled 2023-09-28: qty 3

## 2023-09-28 MED ORDER — ROPINIROLE HCL 1 MG PO TABS
2.0000 mg | ORAL_TABLET | Freq: Two times a day (BID) | ORAL | Status: DC
  Administered 2023-09-28 – 2023-09-29 (×2): 2 mg via ORAL
  Filled 2023-09-28 (×2): qty 2

## 2023-09-28 MED ORDER — ACETAMINOPHEN 500 MG PO TABS
1000.0000 mg | ORAL_TABLET | Freq: Once | ORAL | Status: AC
  Administered 2023-09-28: 1000 mg via ORAL
  Filled 2023-09-28: qty 2

## 2023-09-28 MED ORDER — ATORVASTATIN CALCIUM 20 MG PO TABS
20.0000 mg | ORAL_TABLET | Freq: Every day | ORAL | Status: DC
  Administered 2023-09-29: 20 mg via ORAL
  Filled 2023-09-28: qty 1

## 2023-09-28 MED ORDER — PREDNISONE 50 MG PO TABS
60.0000 mg | ORAL_TABLET | Freq: Every day | ORAL | Status: DC
  Administered 2023-09-29: 60 mg via ORAL
  Filled 2023-09-28: qty 1

## 2023-09-28 MED ORDER — ONDANSETRON HCL 4 MG/2ML IJ SOLN: 4.0000 mg | Freq: Four times a day (QID) | INTRAMUSCULAR | Status: DC | PRN

## 2023-09-28 MED ORDER — INSULIN GLARGINE-YFGN 100 UNIT/ML ~~LOC~~ SOLN
5.0000 [IU] | Freq: Every day | SUBCUTANEOUS | Status: DC
  Administered 2023-09-28: 5 [IU] via SUBCUTANEOUS
  Filled 2023-09-28 (×2): qty 0.05

## 2023-09-28 MED ORDER — ENOXAPARIN SODIUM 40 MG/0.4ML IJ SOSY: 40.0000 mg | PREFILLED_SYRINGE | INTRAMUSCULAR | Status: DC

## 2023-09-28 MED ORDER — SODIUM CHLORIDE 0.9% FLUSH
3.0000 mL | Freq: Two times a day (BID) | INTRAVENOUS | Status: DC
Start: 2023-09-28 — End: 2023-09-29
  Administered 2023-09-28 – 2023-09-29 (×2): 3 mL via INTRAVENOUS

## 2023-09-28 MED ORDER — GUAIFENESIN ER 600 MG PO TB12
600.0000 mg | ORAL_TABLET | Freq: Two times a day (BID) | ORAL | Status: DC
  Administered 2023-09-28: 600 mg via ORAL
  Filled 2023-09-28: qty 1

## 2023-09-28 MED ORDER — FUROSEMIDE 40 MG PO TABS
40.0000 mg | ORAL_TABLET | Freq: Every day | ORAL | Status: DC
  Administered 2023-09-29: 40 mg via ORAL
  Filled 2023-09-28: qty 1

## 2023-09-28 MED ORDER — ALBUTEROL SULFATE (2.5 MG/3ML) 0.083% IN NEBU
5.0000 mg | INHALATION_SOLUTION | Freq: Once | RESPIRATORY_TRACT | Status: AC
  Administered 2023-09-28: 5 mg via RESPIRATORY_TRACT
  Filled 2023-09-28: qty 6

## 2023-09-28 MED ORDER — AMLODIPINE BESYLATE 10 MG PO TABS
10.0000 mg | ORAL_TABLET | Freq: Every day | ORAL | Status: DC
  Administered 2023-09-29: 10 mg via ORAL
  Filled 2023-09-28: qty 1

## 2023-09-28 MED ORDER — BUDESONIDE 0.5 MG/2ML IN SUSP
0.5000 mg | Freq: Two times a day (BID) | RESPIRATORY_TRACT | Status: DC
  Administered 2023-09-28 – 2023-09-29 (×2): 0.5 mg via RESPIRATORY_TRACT
  Filled 2023-09-28 (×2): qty 2

## 2023-09-28 MED ORDER — HYDROCOD POLI-CHLORPHE POLI ER 10-8 MG/5ML PO SUER
5.0000 mL | Freq: Two times a day (BID) | ORAL | Status: DC | PRN
  Administered 2023-09-28: 5 mL via ORAL
  Filled 2023-09-28: qty 5

## 2023-09-28 NOTE — ED Provider Notes (Signed)
 [facility] Provider Note    Event Date/Time   First MD Initiated Contact with Patient 09/28/23 1001     (approximate)   History   Chief Complaint: Shortness of Breath   HPI  Kristina Cruz is a 63 y.o. female with a history of hypertension diabetes obesity who comes to the ED due to shortness of breath and nonproductive cough for the past 2 days along with some watery diarrhea.  Reports that her home health aide was recently sick with a respiratory illness and frequent cough.  Denies chest pain or other pain complaints.          Physical Exam   Triage Vital Signs: ED Triage Vitals  Encounter Vitals Group     BP 09/28/23 1004 (!) 152/70     Systolic BP Percentile --      Diastolic BP Percentile --      Pulse Rate 09/28/23 1004 (!) 103     Resp 09/28/23 1004 18     Temp 09/28/23 1004 (!) 97.3 F (36.3 C)     Temp Source 09/28/23 1004 Axillary     SpO2 09/28/23 1004 100 %     Weight --      Height --      Head Circumference --      Peak Flow --      Pain Score 09/28/23 1002 10     Pain Loc --      Pain Education --      Exclude from Growth Chart --     Most recent vital signs: Vitals:   09/28/23 1200 09/28/23 1230  BP: 137/67 136/69  Pulse: 86 79  Resp: 18 20  Temp:    SpO2: 96% 96%    General: Awake, no distress.  CV:  Good peripheral perfusion.  Tachycardia heart rate 100 Resp:  Normal effort.  Good air entry bilaterally.  There is diffuse mild expiratory wheezing.  No focal crackles Abd:  No distention.  Soft nontender Other:  No lower extremity edema   ED Results / Procedures / Treatments   Labs (all labs ordered are listed, but only abnormal results are displayed) Labs Reviewed  BASIC METABOLIC PANEL - Abnormal; Notable for the following components:      Result Value   Glucose, Bld 264 (*)    Calcium  8.7 (*)    All other components within normal limits  CBC WITH DIFFERENTIAL/PLATELET - Abnormal; Notable for  the following components:   RDW 16.0 (*)    Eosinophils Absolute 0.6 (*)    All other components within normal limits  RESP PANEL BY RT-PCR (RSV, FLU A&B, COVID)  [id number]     EKG Interpreted by me Normal sinus rhythm rate of 99.  Left axis, mildly prolonged QTc of 510 ms.  Right bundle branch block.  No acute ischemic changes.   RADIOLOGY Chest x-ray interpreted by me, unremarkable.  Radiology report reviewed   PROCEDURES:  Procedures   MEDICATIONS ORDERED IN ED: Medications  predniSONE  (DELTASONE ) tablet 60 mg (60 mg Oral Given 09/28/23 1044)  albuterol  (PROVENTIL ) (2.5 MG/3ML) 0.083% nebulizer solution 5 mg (5 mg Nebulization Given 09/28/23 1046)  acetaminophen  (TYLENOL ) tablet 1,000 mg (1,000 mg Oral Given 09/28/23 1259)  naproxen  (NAPROSYN ) tablet 500 mg (500 mg Oral Given 09/28/23 1259)     IMPRESSION / MDM / ASSESSMENT AND PLAN / ED COURSE  I reviewed the triage vital signs and the nursing notes.  DDx: Influenza, COVID, AKI, electrolyte abnormality,  COPD exacerbation, anemia, pneumonia, pleural effusion  Patient's presentation is most consistent with acute presentation with potential threat to life or bodily function.  Patient presents with symptoms of influenza-like illness.  Will check respiratory swab while obtaining labs.  Has some wheezing and with COPD history, will start prednisone , bronchodilators, antibiotic.   Clinical Course as of 09/28/23 1406  Mon Sep 28, 2023  1348 Still very short of breath especially with any ambulation.  Still with persistent wheezing and prolonged expiratory phase.  Will need to hospitalize for further management. [PS]    Clinical Course User Index [PS] Jacquie Maudlin, MD    ----------------------------------------- 2:06 PM on 09/28/2023 ----------------------------------------- Case discussed with hospitalist   FINAL CLINICAL IMPRESSION(S) / ED DIAGNOSES   Final diagnoses:  COPD exacerbation (HCC)     Rx / DC Orders    ED Discharge Orders     None        Note:  This document was prepared using Dragon voice recognition software and may include unintentional dictation errors.   Jacquie Maudlin, MD 09/28/23 [phone]

## 2023-09-28 NOTE — H&P (Signed)
 History and Physical    Patient: Kristina Cruz OZH:[id number] DOB: 24-Jun-1961 DOA: 09/28/2023 DOS: the patient was seen and examined on 09/28/2023 PCP: Chucky Craver, FNP  Patient coming from: Home  Chief Complaint:  Chief Complaint  Patient presents with   Shortness of Breath   HPI: Kristina Cruz is a 63 y.o. female with medical history significant of moderate persistent asthma, type 2 diabetes, morbid obesity, hypertension, pancreatic mass who presents to the ED 2/2 SOB.   [name] states she felt as though she was improving from her recent hospitalization until several days ago, a home health aide visited that was sick.  Starting on 2/7, she began to experience nausea with multiple episodes of nonbilious vomiting.  She subsequently developed watery diarrhea, sinus congestion, headache, nonproductive cough.  She thought she was managing her symptoms okay up until today when she developed severe shortness of breath with diaphoresis.  During the diaphoretic episode, her neighbor was worried she seemed confused.  Due to this, EMS was called.  She feels somewhat better at this time, but endorses continued malaise.  ED Course:  On arrival to the ED, patient was hypertensive at 152/70 with heart rate of 106.  She was saturating at 100% on 2 L.  Per EDP, patient was placed on oxygen for comfort.  She was afebrile at 97.3. Initial workup notable for normal CBC, and BMP with hyperglycemia 264.  Cova-19, influenza and RSV PCR negative.  Chest x-ray with no active disease.  Patient started on Tylenol , naproxen , albuterol  and prednisone .  [facility] contacted for admission.  Review of Systems: As mentioned in the history of present illness. All other systems reviewed and are negative.  Past Medical History:  Diagnosis Date   Anemia    Asthma    Back pain    Cataract    Diabetes mellitus without complication (HCC)    GERD (gastroesophageal reflux disease)    Hypertension    Morbid obesity  with BMI of 60.0-69.9, adult (HCC)    Restless leg syndrome    Past Surgical History:  Procedure Laterality Date   ABDOMINAL HYSTERECTOMY     CATARACT EXTRACTION W/PHACO Left 04/13/2020   Procedure: CATARACT EXTRACTION PHACO AND INTRAOCULAR LENS PLACEMENT (IOC);  Surgeon: Ola Berger, MD;  Location: [facility] ORS;  Service: Ophthalmology;  Laterality: Left;  US  00:36.0 CDE 5.95 Fluid Pack lot # [id number]   HYSTEROSCOPY WITH D & C N/A 07/14/2017   Procedure: DILATATION AND CURETTAGE /HYSTEROSCOPY;  Surgeon: Alben Alma, MD;  Location: [facility] ORS;  Service: Gynecology;  Laterality: N/A;   POLYPECTOMY  2015   Social History:  reports that she has never smoked. She has never used smokeless tobacco. She reports that she does not drink alcohol and does not use drugs.  Allergies  Allergen Reactions   Peanut-Containing Drug Products     Family History  Problem Relation Age of Onset   Breast cancer Mother 95   Diabetes Mother    Hypertension Mother    Ovarian cancer Paternal Aunt        ?   Diabetes Father    Hypertension Father     Prior to Admission medications   Medication Sig Start Date End Date Taking? Authorizing Provider  acetaminophen  (TYLENOL  8 HOUR ARTHRITIS PAIN) 650 MG CR tablet Take 3 tablets (1,950 mg total) by mouth every 8 (eight) hours as needed for pain. Do not exceed 4000 mg total in a day.  Home med. 04/21/23   Garrison Kanner, MD  amLODipine  (NORVASC ) 10 MG tablet Take 10 mg by mouth daily. 11/24/22   [provider]  atorvastatin  (LIPITOR) 20 MG tablet Take 20 mg by mouth daily.    [provider]  baclofen  (LIORESAL ) 10 MG tablet Take 10 mg by mouth 3 (three) times daily.    [provider]  diphenhydrAMINE  (BENADRYL ) 50 MG tablet Take 1 tablet (50 mg total) by mouth every 6 (six) hours as needed for itching (swelling). 09/19/20   Loman Risk, MD  EPINEPHrine  0.3 mg/0.3 mL IJ SOAJ injection Inject 0.3 mg into the muscle as needed for anaphylaxis.  09/19/20   Loman Risk, MD  fluticasone  furoate-vilanterol (BREO ELLIPTA ) 100-25 MCG/ACT AEPB Inhale 1 puff into the lungs daily. 02/13/23   Alexander, Natalie, DO  furosemide  (LASIX ) 40 MG tablet Take 40 mg by mouth daily. 03/23/20   [provider]  gabapentin  (NEURONTIN ) 300 MG capsule Take 600 mg by mouth 2 (two) times daily.    [provider]  guaiFENesin  (MUCINEX ) 600 MG 12 hr tablet Take 1 tablet (600 mg total) by mouth 2 (two) times daily as needed for cough or to loosen phlegm. 02/13/23   Alexander, Natalie, DO  ipratropium-albuterol  (DUONEB) 0.5-2.5 (3) MG/3ML SOLN Take 3 mLs by nebulization every 4 (four) hours as needed (wheezing / shortness of breath). 02/13/23   Alexander, Natalie, DO  melatonin 10 MG TABS Take 10 mg by mouth at bedtime as needed. 04/21/23   Garrison Kanner, MD  metFORMIN  (GLUCOPHAGE ) 1000 MG tablet Take 1,000 mg by mouth 2 (two) times daily. 11/27/22   [provider]  OZEMPIC, 0.25 OR 0.5 MG/DOSE, 2 MG/3ML SOPN Inject into the skin.    [provider]  rOPINIRole  (REQUIP ) 2 MG tablet Take 2 mg by mouth in the morning and at bedtime.     [provider]  sodium chloride  (OCEAN) 0.65 % SOLN nasal spray Place 1 spray into both nostrils as needed for up to 5 days for congestion. 06/30/23 07/05/23  Sheril Dines, MD  VENTOLIN  HFA 108 (90 Base) MCG/ACT inhaler Inhale 2 puffs into the lungs every 4 (four) hours as needed for wheezing or shortness of breath. 02/13/23   Melodi Sprung, DO   Physical Exam: Vitals:   09/28/23 1230 09/28/23 1400 09/28/23 1430 09/28/23 1500  BP: 136/69 (!) 145/76    Pulse: 79 73    Resp: 20 17    Temp:   (!) 97.3 F (36.3 C) 98.3 F (36.8 C)  TempSrc:   Oral Oral  SpO2: 96% 98%     Physical Exam Vitals and nursing note reviewed.  Constitutional:      General: She is not in acute distress.    Appearance: She is obese.  HENT:     Head: Normocephalic and atraumatic.     Mouth/Throat:     Mouth:  Mucous membranes are moist.     Pharynx: Oropharynx is clear.  Cardiovascular:     Rate and Rhythm: Normal rate and regular rhythm.  Pulmonary:     Effort: Tachypnea present. No accessory muscle usage.     Breath sounds: Wheezing (Diffuse expiratory wheezing) present. No decreased breath sounds, rhonchi or rales.  Abdominal:     Palpations: Abdomen is soft.  Musculoskeletal:     Right lower leg: No edema.     Left lower leg: No edema.  Skin:    General: Skin is warm and dry.  Neurological:     General: No focal deficit present.  Mental Status: She is alert and oriented to person, place, and time.  Psychiatric:        Mood and Affect: Mood normal.        Behavior: Behavior normal.    Data Reviewed: CBC with WBC of 8.1, hemoglobin of 12.2, platelets of 284 BMP with sodium of 142, potassium 3.8, bicarb 25, glucose 264, BUN 15, creatinine 0.85 and GFR above 60 COVID-19, influenza and RSV PCR negative  EKG personally reviewed.  Sinus rhythm with rate of 99.  Right bundle branch block.  Compared to EKG obtained in January 2025.  No changes noted.  DG Chest 1 View Result Date: 09/28/2023 CLINICAL DATA:  Cough.  Shortness of breath. EXAM: CHEST  1 VIEW COMPARISON:  08/21/2023. FINDINGS: Bilateral lung fields are clear. Apparent blunting of left lateral costophrenic angle corresponds to prominent epicardial fat pad. No large pleural effusion or pneumothorax. Normal cardio-mediastinal silhouette. No acute osseous abnormalities. The soft tissues are within normal limits. IMPRESSION: No active disease. Electronically Signed   By: Beula Brunswick M.D.   On: 09/28/2023 13:59   Results are pending, will review when available.  Assessment and Plan:  Asthma exacerbation Likely due to viral infection given also experiencing diarrhea and upper respiratory symptoms.  No hypoxia, however patient requested oxygen for comfort.  - Continue supplemental oxygen to maintain oxygen saturation above 88%.  Avoid oxygen for comfort - Wean as tolerated - Continue prednisone  60 mg tomorrow to complete a 5-day course - RVP pending - DuoNebs every 6 hours - Pulmicort  twice daily - Supportive management with Tylenol , Mucinex , Tussionex, Zofran   Pancreatic mass - Will need outpatient imaging when patient is able to hold her breath given moderately suboptimal imaging before.  Type 2 diabetes mellitus (HCC) Uncontrolled type 2 diabetes with last A1c of 8.7%  - SSI, resistant - Semglee  5 units at bedtime - Hold home antiglycemic regimen  Hypertension - Continue home regimen  Advance Care Planning:   Code Status: Full Code   Consults: None  Family Communication: No family at bedside  Severity of Illness: The appropriate patient status for this patient is OBSERVATION. Observation status is judged to be reasonable and necessary in order to provide the required intensity of service to ensure the patient's safety. The patient's presenting symptoms, physical exam findings, and initial radiographic and laboratory data in the context of their medical condition is felt to place them at decreased risk for further clinical deterioration. Furthermore, it is anticipated that the patient will be medically stable for discharge from the hospital within 2 midnights of admission.   Author: Avi Body, MD 09/28/2023 4:12 PM  For on call review [email or web address].

## 2023-09-28 NOTE — Assessment & Plan Note (Signed)
 Uncontrolled type 2 diabetes with last A1c of 8.7%  - SSI, resistant - Semglee  5 units at bedtime - Hold home antiglycemic regimen

## 2023-09-28 NOTE — ED Notes (Signed)
 Pt stated she wanted to use the restroom. This RN told her it may not be safe to get up since she is SOB and that it may be better to use a bedpan. Pt declined to this idea and stated she still wanted to get up to the restroom. Pt given a walker and this RN walked with her and the oxygen to the restroom.

## 2023-09-28 NOTE — Assessment & Plan Note (Signed)
-   Will need outpatient imaging when patient is able to hold her breath given moderately suboptimal imaging before.

## 2023-09-28 NOTE — Progress Notes (Signed)
 PHARMACIST - PHYSICIAN COMMUNICATION  CONCERNING:  Enoxaparin  (Lovenox ) for DVT Prophylaxis    RECOMMENDATION: Patient was prescribed enoxaprin 40mg  q24 hours for VTE prophylaxis.   Filed Weights   09/28/23 1709  Weight: 124.7 kg (275 lb)    Body mass index is 50.3 kg/m.  Estimated Creatinine Clearance: 83.2 mL/min (by C-G formula based on SCr of 0.85 mg/dL).   Based on [facility] policy patient is candidate for enoxaparin  0.5mg /kg TBW SQ every 24 hours based on BMI being >30.   DESCRIPTION: Pharmacy has adjusted enoxaparin  dose per [facility] policy.  Patient is now receiving enoxaparin  60 mg every 24 hours    Niomie Englert Rodriguez-Guzman PharmD, BCPS 09/28/2023 5:15 PM

## 2023-09-28 NOTE — Assessment & Plan Note (Addendum)
 Likely due to viral infection given also experiencing diarrhea and upper respiratory symptoms.  No hypoxia, however patient requested oxygen for comfort.  - Continue supplemental oxygen to maintain oxygen saturation above 88%. Avoid oxygen for comfort - Wean as tolerated - Continue prednisone  60 mg tomorrow to complete a 5-day course - RVP pending - DuoNebs every 6 hours - Pulmicort  twice daily - Supportive management with Tylenol , Mucinex , Tussionex, Zofran 

## 2023-09-28 NOTE — ED Triage Notes (Signed)
 Pt arrives via EMS from home for SOB x2 days. Pt has a nonproductive cough. EMS gave 2.5 mg of albuterol  and 2 mL duoneb. EKG showed R bundle branch block for EMS. Pt has no cardiac history.   EMS vitals: 97.7 F 160/70 BP 18 RR 97% Spo2 on RA

## 2023-09-28 NOTE — Assessment & Plan Note (Signed)
 -  Continue home regimen

## 2023-09-29 DIAGNOSIS — J45901 Unspecified asthma with (acute) exacerbation: Secondary | ICD-10-CM | POA: Diagnosis not present

## 2023-09-29 DIAGNOSIS — J4541 Moderate persistent asthma with (acute) exacerbation: Secondary | ICD-10-CM | POA: Diagnosis not present

## 2023-09-29 DIAGNOSIS — I10 Essential (primary) hypertension: Secondary | ICD-10-CM | POA: Diagnosis not present

## 2023-09-29 DIAGNOSIS — E1165 Type 2 diabetes mellitus with hyperglycemia: Secondary | ICD-10-CM | POA: Diagnosis not present

## 2023-09-29 LAB — CBC WITH DIFFERENTIAL/PLATELET
Abs Immature Granulocytes: 0.07 10*3/uL (ref 0.00–0.07)
Basophils Absolute: 0 10*3/uL (ref 0.0–0.1)
Basophils Relative: 1 %
Eosinophils Absolute: 0.1 10*3/uL (ref 0.0–0.5)
Eosinophils Relative: 1 %
HCT: 36.8 % (ref 36.0–46.0)
Hemoglobin: 12.2 g/dL (ref 12.0–15.0)
Immature Granulocytes: 1 %
Lymphocytes Relative: 20 %
Lymphs Abs: 1.6 10*3/uL (ref 0.7–4.0)
MCH: 27.8 pg (ref 26.0–34.0)
MCHC: 33.2 g/dL (ref 30.0–36.0)
MCV: 83.8 fL (ref 80.0–100.0)
Monocytes Absolute: 0.7 10*3/uL (ref 0.1–1.0)
Monocytes Relative: 8 %
Neutro Abs: 5.4 10*3/uL (ref 1.7–7.7)
Neutrophils Relative %: 69 %
Platelets: 288 10*3/uL (ref 150–400)
RBC: 4.39 MIL/uL (ref 3.87–5.11)
RDW: 15.8 % — ABNORMAL HIGH (ref 11.5–15.5)
WBC: 7.9 10*3/uL (ref 4.0–10.5)
nRBC: 0 % (ref 0.0–0.2)

## 2023-09-29 LAB — BASIC METABOLIC PANEL
Anion gap: 9 (ref 5–15)
BUN: 14 mg/dL (ref 8–23)
CO2: 27 mmol/L (ref 22–32)
Calcium: 8.9 mg/dL (ref 8.9–10.3)
Chloride: 100 mmol/L (ref 98–111)
Creatinine, Ser: 0.63 mg/dL (ref 0.44–1.00)
GFR, Estimated: >60 mL/min (ref >60)
Glucose, Bld: 188 mg/dL — ABNORMAL HIGH (ref 70–99)
Potassium: 3.6 mmol/L (ref 3.5–5.1)
Sodium: 136 mmol/L (ref 135–145)

## 2023-09-29 LAB — GLUCOSE, CAPILLARY
Glucose-Capillary: 177 mg/dL — ABNORMAL HIGH (ref 70–99)
Glucose-Capillary: 217 mg/dL — ABNORMAL HIGH (ref 70–99)

## 2023-09-29 LAB — HIV ANTIBODY (ROUTINE TESTING W REFLEX): HIV Screen 4th Generation wRfx: NONREACTIVE

## 2023-09-29 MED ORDER — PREDNISONE 10 MG PO TABS: 50.0000 mg | ORAL_TABLET | Freq: Every day | ORAL | 0 refills | Status: DC

## 2023-09-29 MED ORDER — IPRATROPIUM-ALBUTEROL 0.5-2.5 (3) MG/3ML IN SOLN: 3.0000 mL | RESPIRATORY_TRACT | 1 refills | Status: DC | PRN

## 2023-09-29 MED ORDER — VENTOLIN HFA 108 (90 BASE) MCG/ACT IN AERS: 2.0000 | INHALATION_SPRAY | RESPIRATORY_TRACT | 1 refills | Status: DC | PRN

## 2023-09-29 MED ORDER — DM-GUAIFENESIN ER 30-600 MG PO TB12
1.0000 | ORAL_TABLET | Freq: Two times a day (BID) | ORAL | Status: DC
  Administered 2023-09-29: 1 via ORAL
  Filled 2023-09-29: qty 1

## 2023-09-29 MED ORDER — PNEUMOCOCCAL 20-VAL CONJ VACC 0.5 ML IM SUSY: 0.5000 mL | PREFILLED_SYRINGE | INTRAMUSCULAR | Status: DC

## 2023-09-29 MED ORDER — FLUTICASONE PROPIONATE 50 MCG/ACT NA SUSP: 1.0000 | Freq: Every day | NASAL | 0 refills | Status: DC

## 2023-09-29 MED ORDER — SALINE SPRAY 0.65 % NA SOLN
1.0000 | Freq: Three times a day (TID) | NASAL | Status: DC
  Administered 2023-09-29: 1 via NASAL
  Filled 2023-09-29: qty 44

## 2023-09-29 MED ORDER — INFLUENZA VAC A&B SURF ANT ADJ 0.5 ML IM SUSY
0.5000 mL | PREFILLED_SYRINGE | INTRAMUSCULAR | Status: DC
  Filled 2023-09-29: qty 0.5

## 2023-09-29 MED ORDER — GUAIFENESIN ER 600 MG PO TB12: 600.0000 mg | ORAL_TABLET | Freq: Two times a day (BID) | ORAL | 0 refills | Status: DC | PRN

## 2023-09-29 MED ORDER — FLUTICASONE FUROATE-VILANTEROL 100-25 MCG/ACT IN AEPB: 1.0000 | INHALATION_SPRAY | Freq: Every day | RESPIRATORY_TRACT | 1 refills | Status: DC

## 2023-09-29 NOTE — Plan of Care (Signed)

## 2023-09-29 NOTE — Discharge Instructions (Addendum)
Keep log of his sugars at home and discussed treatment adjustment for meds with PCP.  Patient and her daughter Kristina Cruz is advised on getting MRI of the abdomen with contrast pancreatic protocol for evaluation of pancreatic mass.

## 2023-09-29 NOTE — Discharge Summary (Addendum)
Physician Discharge Summary   Patient: Kristina Cruz MRN: [id number] DOB: [birth year]  Admit date:     09/28/2023  Discharge date: 09/29/23  Discharge Physician: Enedina Finner   PCP: Dudley Major, FNP   Recommendations at discharge:   follow-up PCP in 1 to 2 weeks keep log of sugars at home discussed with PCP on your next visit  Discharge Diagnoses: Principal Problem:   Acute asthma exacerbation Active Problems:   Asthma exacerbation   Pancreatic mass   Type 2 diabetes mellitus (HCC)   Hypertension  Kristina Cruz is a 63 y.o. female with medical history significant of moderate persistent asthma, type 2 diabetes, morbid obesity, hypertension, pancreatic mass who presents to the ED 2/2 SOB.  In the ER patient's oxygen saturation's remained more than 90%. She was placed on 2 L just for comfort.   Asthma exacerbation sinus congestion/headache --Likely due to viral infection given also experiencing diarrhea and upper respiratory symptoms.  No hypoxia, however patient requested oxygen for comfort.-- Patient remains 98 200% on room air. No respiratory distress or wheezing today. - Continue prednisone 60 mg tomorrow to complete a 5-day course - RVP negative - DuoNebs every 6 hours - Mucinex and Flonase - Supportive management with Tylenol, Mucinex, Zofran -- chest x-ray nothing acute.   Pancreatic mass - Will need outpatient imaging when patient is able to hold her breath given moderately suboptimal imaging before. -- Patient and daughter Kristina Cruz are aware of it. Patient will be rescheduling MRI abdomen appointment. -- Defer to PCP regarding follow-up on MRI abdomen   Type 2 diabetes mellitus (HCC) Uncontrolled type 2 diabetes with last A1c of 8.7%  - SSI, resistant -- resume home meds   Hypertension - Continue home regimen  Morbid obesity --pt advised diet and exercise  Seen by physical therapy and occupational therapy. Will resume home health services including  aide.  Discharge plan was discussed with patient and daughter Kristina Cruz on the phone. There are in agreement.   Advance Care Planning:   Code Status: Full Code        Pain control - [place] Controlled Substance Reporting System database was reviewed. and patient was instructed, not to drive, operate heavy machinery, perform activities at heights, swimming or participation in water activities or provide baby-sitting services while on Pain, Sleep and Anxiety Medications; until their outpatient Physician has advised to do so again. Also recommended to not to take more than prescribed Pain, Sleep and Anxiety Medications.  Consultants: none Disposition: Home health Diet recommendation:  Discharge Diet Orders (From admission, onward)     Start     Ordered   09/29/23 0000  Diet - low sodium heart healthy        09/29/23 1159   09/29/23 0000  Diet Carb Modified        09/29/23 1159           Cardiac and Carb modified diet DISCHARGE MEDICATION: Allergies as of 09/29/2023       Reactions   Peanut-containing Drug Products         Medication List     TAKE these medications    acetaminophen 650 MG CR tablet Commonly known as: Tylenol 8 Hour Arthritis Pain Take 3 tablets (1,950 mg total) by mouth every 8 (eight) hours as needed for pain. Do not exceed 4000 mg total in a day.  Home med.   amLODipine 10 MG tablet Commonly known as: NORVASC Take 10 mg by mouth daily.   atorvastatin 20  MG tablet Commonly known as: LIPITOR Take 20 mg by mouth daily.   baclofen 10 MG tablet Commonly known as: LIORESAL Take 10 mg by mouth 3 (three) times daily.   diphenhydrAMINE 50 MG tablet Commonly known as: BENADRYL Take 1 tablet (50 mg total) by mouth every 6 (six) hours as needed for itching (swelling).   EPINEPHrine 0.3 mg/0.3 mL Soaj injection Commonly known as: EPI-PEN Inject 0.3 mg into the muscle as needed for anaphylaxis.   fluticasone furoate-vilanterol 100-25 MCG/ACT  Aepb Commonly known as: Breo Ellipta Inhale 1 puff into the lungs daily.   furosemide 40 MG tablet Commonly known as: LASIX Take 40 mg by mouth daily.   gabapentin 300 MG capsule Commonly known as: NEURONTIN Take 600 mg by mouth 2 (two) times daily.   guaiFENesin 600 MG 12 hr tablet Commonly known as: MUCINEX Take 1 tablet (600 mg total) by mouth 2 (two) times daily as needed for cough or to loosen phlegm.   ipratropium-albuterol 0.5-2.5 (3) MG/3ML Soln Commonly known as: DUONEB Take 3 mLs by nebulization every 4 (four) hours as needed (wheezing / shortness of breath).   Melatonin 10 MG Tabs Take 10 mg by mouth at bedtime as needed.   metFORMIN 1000 MG tablet Commonly known as: GLUCOPHAGE Take 1,000 mg by mouth 2 (two) times daily.   Ozempic (0.25 or 0.5 MG/DOSE) 2 MG/3ML Sopn Generic drug: Semaglutide(0.25 or 0.5MG /DOS) Inject into the skin.   predniSONE 10 MG tablet Commonly known as: DELTASONE Take 5 tablets (50 mg total) by mouth daily with breakfast. Take 50 mg daily --tpaer by 10 mg daily then stop Start taking on: September 30, 2023   rOPINIRole 2 MG tablet Commonly known as: REQUIP Take 2 mg by mouth in the morning and at bedtime.   sodium chloride 0.65 % Soln nasal spray Commonly known as: OCEAN Place 1 spray into both nostrils as needed for up to 5 days for congestion.   Ventolin HFA 108 (90 Base) MCG/ACT inhaler Generic drug: albuterol Inhale 2 puffs into the lungs every 4 (four) hours as needed for wheezing or shortness of breath.        Follow-up Information     Dudley Major, FNP. Schedule an appointment as soon as possible for a visit.   Specialties: Nurse Practitioner, Family Medicine Why: hospital f/u Contact information: [address] [place] [address] [phone]                Discharge Exam: Ceasar Mons Weights   09/28/23 1709  Weight: 124.7 kg   Morbidly obese. Alert and oriented times three. Not an acute  distress respiratory decreased breath sounds basis. No respiratory distress. No wheezing cardiovascular both heart sounds normal no murmur abdomen soft abdominal obesity.   Condition at discharge: fair  The results of significant diagnostics from this hospitalization (including imaging, microbiology, ancillary and laboratory) are listed below for reference.   Imaging Studies: DG Chest 1 View Result Date: 09/28/2023 CLINICAL DATA:  Cough.  Shortness of breath. EXAM: CHEST  1 VIEW COMPARISON:  08/21/2023. FINDINGS: Bilateral lung fields are clear. Apparent blunting of left lateral costophrenic angle corresponds to prominent epicardial fat pad. No large pleural effusion or pneumothorax. Normal cardio-mediastinal silhouette. No acute osseous abnormalities. The soft tissues are within normal limits. IMPRESSION: No active disease. Electronically Signed   By: Jules Schick M.D.   On: 09/28/2023 13:59    Microbiology: Results for orders placed or performed during the hospital encounter of 09/28/23  Resp  panel by RT-PCR (RSV, Flu A&B, Covid) Anterior Nasal Swab     Status: None   Collection Time: 09/28/23 10:44 AM   Specimen: Anterior Nasal Swab  Result Value Ref Range Status   SARS Coronavirus 2 by RT PCR NEGATIVE NEGATIVE Final    Comment: (NOTE) SARS-CoV-2 target nucleic acids are NOT DETECTED.  The SARS-CoV-2 RNA is generally detectable in upper respiratory specimens during the acute phase of infection. The lowest concentration of SARS-CoV-2 viral copies this assay can detect is 138 copies/mL. A negative result does not preclude SARS-Cov-2 infection and should not be used as the sole basis for treatment or other patient management decisions. A negative result may occur with  improper specimen collection/handling, submission of specimen other than nasopharyngeal swab, presence of viral mutation(s) within the areas targeted by this assay, and inadequate number of viral copies(<138  copies/mL). A negative result must be combined with clinical observations, patient history, and epidemiological information. The expected result is Negative.  Fact Sheet for Patients:  BloggerCourse.com  Fact Sheet for Healthcare Providers:  SeriousBroker.it  This test is no t yet approved or cleared by the [place] FDA and  has been authorized for detection and/or diagnosis of SARS-CoV-2 by FDA under an Emergency Use Authorization (EUA). This EUA will remain  in effect (meaning this test can be used) for the duration of the COVID-19 declaration under Section 564(b)(1) of the Act, 21 U.S.C.section 360bbb-3(b)(1), unless the authorization is terminated  or revoked sooner.       Influenza A by PCR NEGATIVE NEGATIVE Final   Influenza B by PCR NEGATIVE NEGATIVE Final    Comment: (NOTE) The Xpert Xpress SARS-CoV-2/FLU/RSV plus assay is intended as an aid in the diagnosis of influenza from Nasopharyngeal swab specimens and should not be used as a sole basis for treatment. Nasal washings and aspirates are unacceptable for Xpert Xpress SARS-CoV-2/FLU/RSV testing.  Fact Sheet for Patients: BloggerCourse.com  Fact Sheet for Healthcare Providers: SeriousBroker.it  This test is not yet approved or cleared by the [place] FDA and has been authorized for detection and/or diagnosis of SARS-CoV-2 by FDA under an Emergency Use Authorization (EUA). This EUA will remain in effect (meaning this test can be used) for the duration of the COVID-19 declaration under Section 564(b)(1) of the Act, 21 U.S.C. section 360bbb-3(b)(1), unless the authorization is terminated or revoked.     Resp Syncytial Virus by PCR NEGATIVE NEGATIVE Final    Comment: (NOTE) Fact Sheet for Patients: BloggerCourse.com  Fact Sheet for Healthcare  Providers: SeriousBroker.it  This test is not yet approved or cleared by the [place] FDA and has been authorized for detection and/or diagnosis of SARS-CoV-2 by FDA under an Emergency Use Authorization (EUA). This EUA will remain in effect (meaning this test can be used) for the duration of the COVID-19 declaration under Section 564(b)(1) of the Act, 21 U.S.C. section 360bbb-3(b)(1), unless the authorization is terminated or revoked.  Performed at [facility], [address] [place], [place] [address]   Respiratory (~20 pathogens) panel by PCR     Status: None   Collection Time: 09/28/23  3:23 PM   Specimen: Nasopharyngeal Swab; Respiratory  Result Value Ref Range Status   Adenovirus NOT DETECTED NOT DETECTED Final   Coronavirus 229E NOT DETECTED NOT DETECTED Final    Comment: (NOTE) The Coronavirus on the Respiratory Panel, DOES NOT test for the novel  Coronavirus (2019 nCoV)    Coronavirus HKU1 NOT DETECTED NOT DETECTED Final  Coronavirus NL63 NOT DETECTED NOT DETECTED Final   Coronavirus OC43 NOT DETECTED NOT DETECTED Final   Metapneumovirus NOT DETECTED NOT DETECTED Final   Rhinovirus / Enterovirus NOT DETECTED NOT DETECTED Final   Influenza A NOT DETECTED NOT DETECTED Final   Influenza B NOT DETECTED NOT DETECTED Final   Parainfluenza Virus 1 NOT DETECTED NOT DETECTED Final   Parainfluenza Virus 2 NOT DETECTED NOT DETECTED Final   Parainfluenza Virus 3 NOT DETECTED NOT DETECTED Final   Parainfluenza Virus 4 NOT DETECTED NOT DETECTED Final   Respiratory Syncytial Virus NOT DETECTED NOT DETECTED Final   Bordetella pertussis NOT DETECTED NOT DETECTED Final   Bordetella Parapertussis NOT DETECTED NOT DETECTED Final   Chlamydophila pneumoniae NOT DETECTED NOT DETECTED Final   Mycoplasma pneumoniae NOT DETECTED NOT DETECTED Final    Comment: Performed at [facility], [address], [place], [place] [address]     Labs: CBC: Recent Labs  Lab 09/28/23 1019 09/29/23 0709  WBC 8.1 7.9  NEUTROABS 5.7 5.4  HGB 12.2 12.2  HCT 38.2 36.8  MCV 86.2 83.8  PLT 284 288   Basic Metabolic Panel: Recent Labs  Lab 09/28/23 1019 09/29/23 0709  NA 142 136  K 3.8 3.6  CL 103 100  CO2 25 27  GLUCOSE 264* 188*  BUN 15 14  CREATININE 0.85 0.63  CALCIUM 8.7* 8.9   CBG: Recent Labs  Lab 09/28/23 1407 09/28/23 1658 09/28/23 1956 09/29/23 0755  GLUCAP 257* 279* 329* 177*    Discharge time spent: greater than 30 minutes.  Signed: Enedina Finner, MD [facility] 09/29/2023

## 2023-09-29 NOTE — TOC Transition Note (Addendum)
Transition of Care [facility]) - Discharge Note   Patient Details  Name: Kristina Cruz MRN: [id number] Date of Birth: 24-Apr-1961  [facility] CM/SW Contact:  Allena Katz, LCSW Phone Number: 09/29/2023, 12:04 PM   Clinical Narrative:   Pt to discharge home today with daughter. Pt requesting HH through [facility]. CSW has sent cory [facility] to see if they can accept.  [facility] unable to accept but [facility] is able to accept.   Final next level of care: Home w Home Health Services Barriers to Discharge: Barriers Resolved   Patient Goals and CMS Choice Patient states their goals for this hospitalization and ongoing recovery are:: go home with [facility]   Choice offered to / list presented to : Patient      Discharge Placement                Patient to be transferred to facility by: daughter Name of family member notified: daughter [name] Patient and family notified of of transfer: 09/29/23  Discharge Plan and Services Additional resources added to the After Visit Summary for                                       Social Drivers of Health (SDOH) Interventions SDOH Screenings   Food Insecurity: No Food Insecurity (09/28/2023)  Housing: Low Risk  (09/28/2023)  Recent Concern: Housing - High Risk (08/22/2023)  Transportation Needs: Unmet Transportation Needs (09/28/2023)  Utilities: Not At Risk (09/28/2023)  Financial Resource Strain: Low Risk  (09/26/2022)   [facility]  Social Connections: Unknown (09/28/2023)  Tobacco Use: Low Risk  (09/28/2023)     Readmission Risk Interventions    08/23/2023    4:39 PM  Readmission Risk Prevention Plan  Transportation Screening Complete  PCP or Specialist Appt within 5-7 Days Complete  Home Care Screening Complete  Medication Review (RN CM) Complete

## 2023-09-29 NOTE — Evaluation (Signed)
Occupational Therapy Evaluation Patient Details Name: Kristina Cruz Patient MRN: [id number] DOB: 1961-03-03 Today's Date: 09/29/2023   History of Present Illness   History of Present Illness: Kristina Cruz is a 63 y.o. female with medical history significant of moderate persistent asthma, type 2 diabetes, morbid obesity, hypertension, pancreatic mass who presents to the ED 2/2 shortness of breath, vomiting, and malaise.     Clinical Impressions Kristina Cruz was seen for OT evaluation this date. Prior to hospital admission, pt was ambulatory limited household distances, reports extensive falls hx. Pt lives CGA + RW for simulated BSC t/f ~5 ft. MAX A don B socks in sitting. SETUP seated grooming tasks. Pt currently requires CGA + RW for simulated BSC t/f ~5 ft, significantly forward flexed posture with mobility. Reports on days when her aid is unavailable to come she does not get dressed and is likely to fall going to bathroom. Pt would benefit from skilled OT to address noted impairments and functional limitations (see below for any additional details). Upon hospital discharge, recommend OT follow up <3 hours/day; if pt returns home rec HHOT and BSC.      If plan is discharge home, recommend the following:   A little help with walking and/or transfers;A little help with bathing/dressing/bathroom;Help with stairs or ramp for entrance     Functional Status Assessment   Patient has had a recent decline in their functional status and demonstrates the ability to make significant improvements in function in a reasonable and predictable amount of time.     Equipment Recommendations   BSC/3in1     Recommendations for Other Services         Precautions/Restrictions   Precautions Precautions: Fall Restrictions Weight Bearing Restrictions Per Provider Order: No     Mobility Bed Mobility               General bed mobility comments: not tested    Transfers Overall  transfer level: Needs assistance Equipment used: Rolling walker (2 wheels) Transfers: Sit to/from Stand Sit to Stand: Contact guard assist                  Balance Overall balance assessment: Needs assistance Sitting-balance support: No upper extremity supported, Feet supported Sitting balance-Leahy Scale: Good     Standing balance support: Bilateral upper extremity supported Standing balance-Leahy Scale: Poor                             ADL either performed or assessed with clinical judgement   ADL Overall ADL's : Needs assistance/impaired                                       General ADL Comments: CGA + RW for simulated BSC t/f ~5 ft. MAX A don B socks in sitting. SETUP seated grooming tasks.     Vision         Perception         Praxis         Pertinent Vitals/Pain Pain Assessment Pain Assessment: Faces Faces Pain Scale: Hurts little more Pain Location: headache Pain Descriptors / Indicators: Headache Pain Intervention(s): RN gave pain meds during session     Extremity/Trunk Assessment Upper Extremity Assessment Upper Extremity Assessment: Generalized weakness   Lower Extremity Assessment Lower Extremity Assessment: Generalized weakness       Communication Communication Communication: No  apparent difficulties   Cognition Arousal: Alert Behavior During Therapy: WFL for tasks assessed/performed Cognition: No apparent impairments                               Following commands: Intact       Cueing  General Comments   Cueing Techniques: Verbal cues      Exercises     Shoulder Instructions      Home Living Family/patient expects to be discharged to:: Private residence Living Arrangements: Alone Available Help at Discharge: Family;Personal care attendant;Available PRN/intermittently Type of Home: Apartment Home Access: Stairs to enter Entrance Stairs-Number of Steps: 1 step to enter apt  building (no railing) and then 3-4 steps with B railing (unable to reach both at same time) to enter home   Home Layout: One level     Bathroom Shower/Tub: Tub/shower unit         Home Equipment: None          Prior Functioning/Environment Prior Level of Function : Needs assist             Mobility Comments: Pt reports being modified independent ambulating with RW (limited household distances d/t SOB/weakness); requires 2 assist typically with stairs; no home O2 use ADLs Comments: Pt reports having home health aid 3 hours/day (7 days a week) to assist with IADLs and dressing    OT Problem List: Decreased strength;Decreased range of motion;Decreased activity tolerance;Impaired balance (sitting and/or standing);Decreased safety awareness   OT Treatment/Interventions: Self-care/ADL training;Therapeutic exercise;Energy conservation;DME and/or AE instruction;Therapeutic activities      OT Goals(Current goals can be found in the care plan section)   Acute Rehab OT Goals Patient Stated Goal: to go home OT Goal Formulation: With patient Time For Goal Achievement: 10/13/23 Potential to Achieve Goals: Good ADL Goals Pt Will Perform Grooming: with modified independence;standing Pt Will Perform Lower Body Dressing: with modified independence;sit to/from stand Pt Will Transfer to Toilet: with modified independence;ambulating;regular height toilet   OT Frequency:  Min 1X/week    Co-evaluation              AM-PAC OT "6 Clicks" Daily Activity     Outcome Measure Help from another person eating meals?: None Help from another person taking care of personal grooming?: A Little Help from another person toileting, which includes using toliet, bedpan, or urinal?: A Lot Help from another person bathing (including washing, rinsing, drying)?: A Lot Help from another person to put on and taking off regular upper body clothing?: A Little Help from another person to put on and  taking off regular lower body clothing?: A Lot 6 Click Score: 16   End of Session Equipment Utilized During Treatment: Rolling walker (2 wheels)  Activity Tolerance: Patient tolerated treatment well;Patient limited by fatigue Patient left: in bed;with call bell/phone within reach;with nursing/sitter in room  OT Visit Diagnosis: Other abnormalities of gait and mobility (R26.89);Muscle weakness (generalized) (M62.81)                Time: 1610-9604 OT Time Calculation (min): 17 min Charges:  OT General Charges $OT Visit: 1 Visit OT Evaluation $OT Eval Moderate Complexity: 1 Mod  Kathie Dike, M.S. OTR/L  09/29/23, 11:27 AM  ascom [phone]

## 2023-09-29 NOTE — Plan of Care (Signed)
Problem: Education: Goal: Ability to describe self-care measures that may prevent or decrease complications (Diabetes Survival Skills Education) will improve Outcome: Adequate for Discharge Goal: Individualized Educational Video(s) Outcome: Adequate for Discharge   Problem: Coping: Goal: Ability to adjust to condition or change in health will improve Outcome: Adequate for Discharge   Problem: Fluid Volume: Goal: Ability to maintain a balanced intake and output will improve Outcome: Adequate for Discharge   Problem: Health Behavior/Discharge Planning: Goal: Ability to identify and utilize available resources and services will improve Outcome: Adequate for Discharge Goal: Ability to manage health-related needs will improve Outcome: Adequate for Discharge   Problem: Metabolic: Goal: Ability to maintain appropriate glucose levels will improve Outcome: Adequate for Discharge   Problem: Nutritional: Goal: Maintenance of adequate nutrition will improve Outcome: Adequate for Discharge Goal: Progress toward achieving an optimal weight will improve Outcome: Adequate for Discharge   Problem: Skin Integrity: Goal: Risk for impaired skin integrity will decrease Outcome: Adequate for Discharge   Problem: Tissue Perfusion: Goal: Adequacy of tissue perfusion will improve Outcome: Adequate for Discharge   Problem: Education: Goal: Knowledge of General Education information will improve Description: Including pain rating scale, medication(s)/side effects and non-pharmacologic comfort measures Outcome: Adequate for Discharge   Problem: Health Behavior/Discharge Planning: Goal: Ability to manage health-related needs will improve Outcome: Adequate for Discharge   Problem: Clinical Measurements: Goal: Ability to maintain clinical measurements within normal limits will improve Outcome: Adequate for Discharge Goal: Will remain free from infection Outcome: Adequate for Discharge Goal:  Diagnostic test results will improve Outcome: Adequate for Discharge Goal: Respiratory complications will improve Outcome: Adequate for Discharge Goal: Cardiovascular complication will be avoided Outcome: Adequate for Discharge   Problem: Activity: Goal: Risk for activity intolerance will decrease Outcome: Adequate for Discharge   Problem: Nutrition: Goal: Adequate nutrition will be maintained Outcome: Adequate for Discharge   Problem: Coping: Goal: Level of anxiety will decrease Outcome: Adequate for Discharge   Problem: Elimination: Goal: Will not experience complications related to bowel motility Outcome: Adequate for Discharge Goal: Will not experience complications related to urinary retention Outcome: Adequate for Discharge   Problem: Pain Managment: Goal: General experience of comfort will improve and/or be controlled Outcome: Adequate for Discharge   Problem: Safety: Goal: Ability to remain free from injury will improve Outcome: Adequate for Discharge   Problem: Skin Integrity: Goal: Risk for impaired skin integrity will decrease Outcome: Adequate for Discharge   Problem: Education: Goal: Knowledge of disease or condition will improve Outcome: Adequate for Discharge Goal: Knowledge of the prescribed therapeutic regimen will improve Outcome: Adequate for Discharge Goal: Individualized Educational Video(s) Outcome: Adequate for Discharge   Problem: Activity: Goal: Ability to tolerate increased activity will improve Outcome: Adequate for Discharge Goal: Will verbalize the importance of balancing activity with adequate rest periods Outcome: Adequate for Discharge   Problem: Respiratory: Goal: Ability to maintain a clear airway will improve Outcome: Adequate for Discharge Goal: Levels of oxygenation will improve Outcome: Adequate for Discharge Goal: Ability to maintain adequate ventilation will improve Outcome: Adequate for Discharge

## 2023-09-29 NOTE — Evaluation (Signed)
Physical Therapy Evaluation Patient Details Name: Kristina Cruz MRN: [id number] DOB: 11-24-1960 Today's Date: 09/29/2023  History of Present Illness  Kristina Cruz is a 63 y.o. female with medical history significant of moderate persistent asthma, type 2 diabetes, morbid obesity, hypertension, pancreatic mass who presents to the ED 2/2 shortness of breath, vomiting, and malaise.   Clinical Impression  Pt seated EOB upon arrival to the room.  Pt noted that she was not wanting to tire herself out before being discharged to home, however would be willing to demonstrate ambulation with therapist.  Pt reports she has assistance getting up into her house, however does not leave other than emergencies and MD appointments.  Pt able to ambulate to the doorway then back to bed, but with severely slowed gait due to inability to advance the LE's from forward flexed posture.  Pt would likely benefit from further skilled therapy services in short term rehab facility, however ultimately declining.  Pt left with all needs met and call bell within reach.          If plan is discharge home, recommend the following: A lot of help with walking and/or transfers;A lot of help with bathing/dressing/bathroom;Assistance with cooking/housework;Assist for transportation;Help with stairs or ramp for entrance   Can travel by private vehicle        Equipment Recommendations None recommended by PT  Recommendations for Other Services       Functional Status Assessment Patient has had a recent decline in their functional status and demonstrates the ability to make significant improvements in function in a reasonable and predictable amount of time.     Precautions / Restrictions Precautions Precautions: Fall Restrictions Weight Bearing Restrictions Per Provider Order: No      Mobility  Bed Mobility               General bed mobility comments: not tested    Transfers Overall transfer level:  Needs assistance Equipment used: Rolling walker (2 wheels) Transfers: Sit to/from Stand Sit to Stand: Contact guard assist                Ambulation/Gait Ambulation/Gait assistance: Contact guard assist Gait Distance (Feet): 10 Feet Assistive device: Rolling walker (2 wheels)   Gait velocity: significant decreased.     General Gait Details: pt with significantly forward flexed posture due to chronic back pain and limited mobility of the lumbar spine.  Stairs            Wheelchair Mobility     Tilt Bed    Modified Rankin (Stroke Patients Only)       Balance Overall balance assessment: Needs assistance Sitting-balance support: No upper extremity supported, Feet supported Sitting balance-Leahy Scale: Good     Standing balance support: Bilateral upper extremity supported Standing balance-Leahy Scale: Poor                               Pertinent Vitals/Pain Pain Assessment Pain Assessment: Faces Faces Pain Scale: Hurts little more Pain Location: headache Pain Descriptors / Indicators: Headache Pain Intervention(s): Monitored during session, Premedicated before session    Home Living Family/patient expects to be discharged to:: Private residence Living Arrangements: Alone Available Help at Discharge: Family;Personal care attendant;Available PRN/intermittently Type of Home: Apartment Home Access: Stairs to enter   Entrance Stairs-Number of Steps: 1 step to enter apt building (no railing) and then 3-4 steps with B railing (unable to reach both at same time)  to enter home   Home Layout: One level Home Equipment: None      Prior Function Prior Level of Function : Needs assist             Mobility Comments: Pt reports being modified independent ambulating with RW (limited household distances d/t SOB/weakness); requires 2 assist typically with stairs; no home O2 use ADLs Comments: Pt reports having home health aid 3 hours/day (7 days a  week) to assist with IADLs and dressing     Extremity/Trunk Assessment   Upper Extremity Assessment Upper Extremity Assessment: Generalized weakness    Lower Extremity Assessment Lower Extremity Assessment: Generalized weakness       Communication   Communication Communication: No apparent difficulties    Cognition Arousal: Alert Behavior During Therapy: WFL for tasks assessed/performed                             Following commands: Intact       Cueing Cueing Techniques: Verbal cues     General Comments      Exercises     Assessment/Plan    PT Assessment Patient needs continued PT services  PT Problem List Decreased strength;Decreased range of motion;Decreased activity tolerance;Decreased balance;Decreased mobility;Decreased knowledge of use of DME;Decreased safety awareness;Obesity;Pain       PT Treatment Interventions DME instruction;Gait training;Stair training;Functional mobility training;Therapeutic activities;Therapeutic exercise;Balance training;Neuromuscular re-education    PT Goals (Current goals can be found in the Care Plan section)  Acute Rehab PT Goals Patient Stated Goal: to go home today. PT Goal Formulation: With patient Time For Goal Achievement: 10/13/23 Potential to Achieve Goals: Good    Frequency Min 1X/week     Co-evaluation               AM-PAC PT "6 Clicks" Mobility  Outcome Measure Help needed turning from your back to your side while in a flat bed without using bedrails?: A Little Help needed moving from lying on your back to sitting on the side of a flat bed without using bedrails?: A Little Help needed moving to and from a bed to a chair (including a wheelchair)?: A Little Help needed standing up from a chair using your arms (e.g., wheelchair or bedside chair)?: A Little Help needed to walk in hospital room?: A Lot Help needed climbing 3-5 steps with a railing? : Total 6 Click Score: 15    End of  Session Equipment Utilized During Treatment: Gait belt Activity Tolerance: Patient limited by fatigue;Patient limited by pain Patient left: in bed (seated EOB) Nurse Communication: Mobility status PT Visit Diagnosis: Unsteadiness on feet (R26.81);Other abnormalities of gait and mobility (R26.89);Muscle weakness (generalized) (M62.81);Difficulty in walking, not elsewhere classified (R26.2);Pain Pain - part of body:  (Back)    Time: 9562-1308 PT Time Calculation (min) (ACUTE ONLY): 8 min   Charges:   PT Evaluation $PT Eval Low Complexity: 1 Low   PT General Charges $$ ACUTE PT VISIT: 1 Visit         Nolon Bussing, PT, DPT Physical Therapist - [facility]  09/29/23, 1:08 PM

## 2023-10-12 ENCOUNTER — Ambulatory Visit: Admission: RE | Admit: 2023-10-12 | Payer: 59 | Source: Ambulatory Visit

## 2023-10-15 ENCOUNTER — Ambulatory Visit
Admission: RE | Admit: 2023-10-15 | Discharge: 2023-10-15 | Disposition: A | Discharge status: Home / Self Care | Payer: 59 | Source: Ambulatory Visit | Attending: Obstetrics and Gynecology | Admitting: Obstetrics and Gynecology

## 2023-10-15 DIAGNOSIS — K8689 Other specified diseases of pancreas: Secondary | ICD-10-CM

## 2023-10-16 NOTE — Telephone Encounter (Signed)
 The patient called in to schedule appointment.

## 2023-10-20 ENCOUNTER — Encounter (INDEPENDENT_AMBULATORY_CARE_PROVIDER_SITE_OTHER): Payer: 59 | Admitting: Vascular Surgery

## 2023-11-05 ENCOUNTER — Inpatient Hospital Stay
Admission: EM | Admit: 2023-11-05 | Discharge: 2023-11-10 | DRG: 436 | Disposition: A | Discharge status: Home Health | Attending: Internal Medicine | Admitting: Internal Medicine

## 2023-11-05 ENCOUNTER — Emergency Department

## 2023-11-05 ENCOUNTER — Other Ambulatory Visit: Payer: Self-pay

## 2023-11-05 DIAGNOSIS — Z6841 Body Mass Index (BMI) 40.0 and over, adult: Secondary | ICD-10-CM

## 2023-11-05 DIAGNOSIS — Z8701 Personal history of pneumonia (recurrent): Secondary | ICD-10-CM

## 2023-11-05 DIAGNOSIS — R112 Nausea with vomiting, unspecified: Secondary | ICD-10-CM | POA: Diagnosis present

## 2023-11-05 DIAGNOSIS — Z7985 Long-term (current) use of injectable non-insulin antidiabetic drugs: Secondary | ICD-10-CM

## 2023-11-05 DIAGNOSIS — C252 Malignant neoplasm of tail of pancreas: Secondary | ICD-10-CM | POA: Diagnosis not present

## 2023-11-05 DIAGNOSIS — Z8616 Personal history of COVID-19: Secondary | ICD-10-CM

## 2023-11-05 DIAGNOSIS — Z7984 Long term (current) use of oral hypoglycemic drugs: Secondary | ICD-10-CM

## 2023-11-05 DIAGNOSIS — Z91010 Allergy to peanuts: Secondary | ICD-10-CM

## 2023-11-05 DIAGNOSIS — E1142 Type 2 diabetes mellitus with diabetic polyneuropathy: Secondary | ICD-10-CM | POA: Diagnosis present

## 2023-11-05 DIAGNOSIS — G2581 Restless legs syndrome: Secondary | ICD-10-CM | POA: Diagnosis present

## 2023-11-05 DIAGNOSIS — Z90710 Acquired absence of both cervix and uterus: Secondary | ICD-10-CM

## 2023-11-05 DIAGNOSIS — K8689 Other specified diseases of pancreas: Secondary | ICD-10-CM

## 2023-11-05 DIAGNOSIS — F40240 Claustrophobia: Secondary | ICD-10-CM | POA: Diagnosis present

## 2023-11-05 DIAGNOSIS — I10 Essential (primary) hypertension: Secondary | ICD-10-CM | POA: Diagnosis present

## 2023-11-05 DIAGNOSIS — Z8249 Family history of ischemic heart disease and other diseases of the circulatory system: Secondary | ICD-10-CM

## 2023-11-05 DIAGNOSIS — R52 Pain, unspecified: Secondary | ICD-10-CM | POA: Diagnosis present

## 2023-11-05 DIAGNOSIS — E785 Hyperlipidemia, unspecified: Secondary | ICD-10-CM | POA: Diagnosis not present

## 2023-11-05 DIAGNOSIS — R1013 Epigastric pain: Principal | ICD-10-CM

## 2023-11-05 DIAGNOSIS — Z66 Do not resuscitate: Secondary | ICD-10-CM | POA: Diagnosis present

## 2023-11-05 DIAGNOSIS — J449 Chronic obstructive pulmonary disease, unspecified: Secondary | ICD-10-CM

## 2023-11-05 DIAGNOSIS — G893 Neoplasm related pain (acute) (chronic): Secondary | ICD-10-CM | POA: Diagnosis present

## 2023-11-05 DIAGNOSIS — Z8041 Family history of malignant neoplasm of ovary: Secondary | ICD-10-CM

## 2023-11-05 DIAGNOSIS — K219 Gastro-esophageal reflux disease without esophagitis: Secondary | ICD-10-CM | POA: Diagnosis present

## 2023-11-05 DIAGNOSIS — R160 Hepatomegaly, not elsewhere classified: Secondary | ICD-10-CM

## 2023-11-05 DIAGNOSIS — Z7951 Long term (current) use of inhaled steroids: Secondary | ICD-10-CM

## 2023-11-05 DIAGNOSIS — E66813 Obesity, class 3: Secondary | ICD-10-CM | POA: Diagnosis present

## 2023-11-05 DIAGNOSIS — C259 Malignant neoplasm of pancreas, unspecified: Secondary | ICD-10-CM | POA: Diagnosis not present

## 2023-11-05 DIAGNOSIS — Z803 Family history of malignant neoplasm of breast: Secondary | ICD-10-CM

## 2023-11-05 DIAGNOSIS — J4489 Other specified chronic obstructive pulmonary disease: Secondary | ICD-10-CM | POA: Diagnosis present

## 2023-11-05 DIAGNOSIS — Z79899 Other long term (current) drug therapy: Secondary | ICD-10-CM

## 2023-11-05 DIAGNOSIS — Z833 Family history of diabetes mellitus: Secondary | ICD-10-CM

## 2023-11-05 DIAGNOSIS — Z515 Encounter for palliative care: Secondary | ICD-10-CM

## 2023-11-05 DIAGNOSIS — E1165 Type 2 diabetes mellitus with hyperglycemia: Secondary | ICD-10-CM | POA: Diagnosis present

## 2023-11-05 DIAGNOSIS — C787 Secondary malignant neoplasm of liver and intrahepatic bile duct: Secondary | ICD-10-CM | POA: Diagnosis present

## 2023-11-05 LAB — COMPREHENSIVE METABOLIC PANEL
ALT: 12 U/L (ref 0–44)
AST: 12 U/L — ABNORMAL LOW (ref 15–41)
Albumin: 3.8 g/dL (ref 3.5–5.0)
Alkaline Phosphatase: 80 U/L (ref 38–126)
Anion gap: 7 (ref 5–15)
BUN: 10 mg/dL (ref 8–23)
CO2: 29 mmol/L (ref 22–32)
Calcium: 9 mg/dL (ref 8.9–10.3)
Chloride: 99 mmol/L (ref 98–111)
Creatinine, Ser: 0.7 mg/dL (ref 0.44–1.00)
GFR, Estimated: >60 mL/min (ref >60)
Glucose, Bld: 227 mg/dL — ABNORMAL HIGH (ref 70–99)
Potassium: 3.9 mmol/L (ref 3.5–5.1)
Sodium: 135 mmol/L (ref 135–145)
Total Bilirubin: 0.9 mg/dL (ref 0.0–1.2)
Total Protein: 7.8 g/dL (ref 6.5–8.1)

## 2023-11-05 LAB — CBC
HCT: 41.6 % (ref 36.0–46.0)
Hemoglobin: 13.4 g/dL (ref 12.0–15.0)
MCH: 28 pg (ref 26.0–34.0)
MCHC: 32.2 g/dL (ref 30.0–36.0)
MCV: 86.8 fL (ref 80.0–100.0)
Platelets: 277 10*3/uL (ref 150–400)
RBC: 4.79 MIL/uL (ref 3.87–5.11)
RDW: 14.8 % (ref 11.5–15.5)
WBC: 8.3 10*3/uL (ref 4.0–10.5)
nRBC: 0 % (ref 0.0–0.2)

## 2023-11-05 LAB — LIPASE, BLOOD: Lipase: 24 U/L (ref 11–51)

## 2023-11-05 MED ORDER — GABAPENTIN 300 MG PO CAPS
600.0000 mg | ORAL_CAPSULE | Freq: Two times a day (BID) | ORAL | Status: DC
  Administered 2023-11-06 – 2023-11-10 (×9): 600 mg via ORAL
  Filled 2023-11-05 (×9): qty 2

## 2023-11-05 MED ORDER — ONDANSETRON HCL 4 MG/2ML IJ SOLN
4.0000 mg | Freq: Four times a day (QID) | INTRAMUSCULAR | Status: DC | PRN
  Administered 2023-11-06 (×4): 4 mg via INTRAVENOUS
  Filled 2023-11-05 (×4): qty 2

## 2023-11-05 MED ORDER — ENOXAPARIN SODIUM 60 MG/0.6ML IJ SOSY
60.0000 mg | PREFILLED_SYRINGE | INTRAMUSCULAR | Status: DC
  Administered 2023-11-06 – 2023-11-10 (×3): 60 mg via SUBCUTANEOUS
  Filled 2023-11-05 (×3): qty 0.6

## 2023-11-05 MED ORDER — SODIUM CHLORIDE 0.9 % IV SOLN: INTRAVENOUS | Status: DC

## 2023-11-05 MED ORDER — IOHEXOL 350 MG/ML SOLN
100.0000 mL | Freq: Once | INTRAVENOUS | Status: AC | PRN
  Administered 2023-11-05: 100 mL via INTRAVENOUS

## 2023-11-05 MED ORDER — BACLOFEN 10 MG PO TABS
10.0000 mg | ORAL_TABLET | Freq: Three times a day (TID) | ORAL | Status: DC
  Filled 2023-11-05: qty 1

## 2023-11-05 MED ORDER — ROPINIROLE HCL 1 MG PO TABS
2.0000 mg | ORAL_TABLET | Freq: Two times a day (BID) | ORAL | Status: DC
  Administered 2023-11-06 – 2023-11-10 (×10): 2 mg via ORAL
  Filled 2023-11-05 (×10): qty 2

## 2023-11-05 MED ORDER — FLUTICASONE FUROATE-VILANTEROL 100-25 MCG/ACT IN AEPB
1.0000 | INHALATION_SPRAY | Freq: Every day | RESPIRATORY_TRACT | Status: DC
  Administered 2023-11-06 – 2023-11-10 (×5): 1 via RESPIRATORY_TRACT
  Filled 2023-11-05 (×2): qty 28

## 2023-11-05 MED ORDER — FLUTICASONE PROPIONATE 50 MCG/ACT NA SUSP
1.0000 | Freq: Every day | NASAL | Status: DC
  Administered 2023-11-06 – 2023-11-10 (×5): 1 via NASAL
  Filled 2023-11-05 (×2): qty 16

## 2023-11-05 MED ORDER — ATORVASTATIN CALCIUM 20 MG PO TABS
20.0000 mg | ORAL_TABLET | Freq: Every day | ORAL | Status: DC
  Administered 2023-11-06 – 2023-11-10 (×5): 20 mg via ORAL
  Filled 2023-11-05 (×5): qty 1

## 2023-11-05 MED ORDER — MAGNESIUM HYDROXIDE 400 MG/5ML PO SUSP
30.0000 mL | Freq: Every day | ORAL | Status: DC | PRN
  Administered 2023-11-08: 30 mL via ORAL
  Filled 2023-11-05: qty 30

## 2023-11-05 MED ORDER — IPRATROPIUM-ALBUTEROL 0.5-2.5 (3) MG/3ML IN SOLN
3.0000 mL | RESPIRATORY_TRACT | Status: DC | PRN
Start: 2023-11-05 — End: 2023-11-10

## 2023-11-05 MED ORDER — FUROSEMIDE 40 MG PO TABS
40.0000 mg | ORAL_TABLET | Freq: Every day | ORAL | Status: DC
  Administered 2023-11-09: 40 mg via ORAL
  Filled 2023-11-05 (×5): qty 1

## 2023-11-05 MED ORDER — ONDANSETRON HCL 4 MG/2ML IJ SOLN
4.0000 mg | Freq: Once | INTRAMUSCULAR | Status: AC
Start: 2023-11-05 — End: 2023-11-05
  Administered 2023-11-05: 4 mg via INTRAVENOUS
  Filled 2023-11-05: qty 2

## 2023-11-05 MED ORDER — TRAZODONE HCL 50 MG PO TABS
25.0000 mg | ORAL_TABLET | Freq: Every evening | ORAL | Status: DC | PRN
  Administered 2023-11-06: 25 mg via ORAL
  Filled 2023-11-05: qty 1

## 2023-11-05 MED ORDER — MELATONIN 5 MG PO TABS: 10.0000 mg | ORAL_TABLET | Freq: Every evening | ORAL | Status: DC | PRN

## 2023-11-05 MED ORDER — AMLODIPINE BESYLATE 10 MG PO TABS
10.0000 mg | ORAL_TABLET | Freq: Every day | ORAL | Status: DC
  Administered 2023-11-06 – 2023-11-10 (×4): 10 mg via ORAL
  Filled 2023-11-05 (×3): qty 1
  Filled 2023-11-05: qty 2

## 2023-11-05 MED ORDER — GUAIFENESIN ER 600 MG PO TB12: 600.0000 mg | ORAL_TABLET | Freq: Two times a day (BID) | ORAL | Status: DC | PRN

## 2023-11-05 MED ORDER — ACETAMINOPHEN 325 MG PO TABS: 650.0000 mg | ORAL_TABLET | Freq: Four times a day (QID) | ORAL | Status: DC | PRN

## 2023-11-05 MED ORDER — MORPHINE SULFATE (PF) 4 MG/ML IV SOLN
4.0000 mg | Freq: Once | INTRAVENOUS | Status: AC
  Administered 2023-11-05: 4 mg via INTRAVENOUS
  Filled 2023-11-05: qty 1

## 2023-11-05 MED ORDER — ONDANSETRON HCL 4 MG PO TABS: 4.0000 mg | ORAL_TABLET | Freq: Four times a day (QID) | ORAL | Status: DC | PRN

## 2023-11-05 MED ORDER — EPINEPHRINE 0.3 MG/0.3ML IJ SOAJ
0.3000 mg | INTRAMUSCULAR | Status: DC | PRN
Start: 2023-11-05 — End: 2023-11-10

## 2023-11-05 MED ORDER — ACETAMINOPHEN 650 MG RE SUPP: 650.0000 mg | Freq: Four times a day (QID) | RECTAL | Status: DC | PRN

## 2023-11-05 NOTE — H&P (Incomplete)
 [place]   PATIENT NAME: Kristina Cruz    MR#:  [id number]  DATE OF BIRTH:  [birth year]  DATE OF ADMISSION:  11/05/2023  PRIMARY CARE PHYSICIAN: Dudley Major, FNP   Patient is coming from: Home  REQUESTING/REFERRING PHYSICIAN: Chesley Noon, MD  CHIEF COMPLAINT:   Chief Complaint  Patient presents with  . Abdominal Pain    HISTORY OF PRESENT ILLNESS:  Kristina Cruz is a 63 y.o. female with medical history significant for ***  ED Course: *** EKG as reviewed by me : *** Imaging: *** PAST MEDICAL HISTORY:   Past Medical History:  Diagnosis Date  . Anemia   . Asthma   . Back pain   . Cataract   . Diabetes mellitus without complication (HCC)   . GERD (gastroesophageal reflux disease)   . Hypertension   . Morbid obesity with BMI of 60.0-69.9, adult (HCC)   . Restless leg syndrome     PAST SURGICAL HISTORY:   Past Surgical History:  Procedure Laterality Date  . ABDOMINAL HYSTERECTOMY    . CATARACT EXTRACTION W/PHACO Left 04/13/2020   Procedure: CATARACT EXTRACTION PHACO AND INTRAOCULAR LENS PLACEMENT (IOC);  Surgeon: Elliot Cousin, MD;  Location: [facility] ORS;  Service: Ophthalmology;  Laterality: Left;  [place] 00:36.0 CDE 5.95 Fluid Pack lot # [id number]  . HYSTEROSCOPY WITH D & C N/A 07/14/2017   Procedure: DILATATION AND CURETTAGE /HYSTEROSCOPY;  Surgeon: Nadara Mustard, MD;  Location: [facility] ORS;  Service: Gynecology;  Laterality: N/A;  . POLYPECTOMY  2015    SOCIAL HISTORY:   Social History   Tobacco Use  . Smoking status: Never  . Smokeless tobacco: Never  Substance Use Topics  . Alcohol use: No    FAMILY HISTORY:   Family History  Problem Relation Age of Onset  . Breast cancer Mother 77  . Diabetes Mother   . Hypertension Mother   . Ovarian cancer Paternal Aunt        ?  . Diabetes Father   . Hypertension Father     DRUG ALLERGIES:   Allergies  Allergen Reactions  . Peanut-Containing Drug Products     REVIEW OF SYSTEMS:    ROS As per history of present illness. All pertinent systems were reviewed above. Constitutional, HEENT, cardiovascular, respiratory, GI, GU, musculoskeletal, neuro, psychiatric, endocrine, integumentary and hematologic systems were reviewed and are otherwise negative/unremarkable except for positive findings mentioned above in the HPI.   MEDICATIONS AT HOME:   Prior to Admission medications   Medication Sig Start Date End Date Taking? Authorizing Provider  acetaminophen (TYLENOL 8 HOUR ARTHRITIS PAIN) 650 MG CR tablet Take 3 tablets (1,950 mg total) by mouth every 8 (eight) hours as needed for pain. Do not exceed 4000 mg total in a day.  Home med. 04/21/23   Darlin Priestly, MD  amLODipine (NORVASC) 10 MG tablet Take 10 mg by mouth daily. 11/24/22   [provider]  atorvastatin (LIPITOR) 20 MG tablet Take 20 mg by mouth daily.    [provider]  baclofen (LIORESAL) 10 MG tablet Take 10 mg by mouth 3 (three) times daily.    [provider]  diphenhydrAMINE (BENADRYL) 50 MG tablet Take 1 tablet (50 mg total) by mouth every 6 (six) hours as needed for itching (swelling). 09/19/20   Shaune Pollack, MD  EPINEPHrine 0.3 mg/0.3 mL IJ SOAJ injection Inject 0.3 mg into the muscle as needed for anaphylaxis. 09/19/20   Shaune Pollack, MD  fluticasone Aleda Grana)  50 MCG/ACT nasal spray Place 1 spray into both nostrils daily. 09/29/23   Enedina Finner, MD  fluticasone furoate-vilanterol (BREO ELLIPTA) 100-25 MCG/ACT AEPB Inhale 1 puff into the lungs daily. 09/29/23   Enedina Finner, MD  furosemide (LASIX) 40 MG tablet Take 40 mg by mouth daily. 03/23/20   [provider]  gabapentin (NEURONTIN) 300 MG capsule Take 600 mg by mouth 2 (two) times daily.    [provider]  guaiFENesin (MUCINEX) 600 MG 12 hr tablet Take 1 tablet (600 mg total) by mouth 2 (two) times daily as needed for cough or to loosen phlegm. 09/29/23   Enedina Finner, MD  ipratropium-albuterol (DUONEB) 0.5-2.5 (3)  MG/3ML SOLN Take 3 mLs by nebulization every 4 (four) hours as needed (wheezing / shortness of breath). 09/29/23   Enedina Finner, MD  melatonin 10 MG TABS Take 10 mg by mouth at bedtime as needed. 04/21/23   Darlin Priestly, MD  metFORMIN (GLUCOPHAGE) 1000 MG tablet Take 1,000 mg by mouth 2 (two) times daily. 11/27/22   [provider]  OZEMPIC, 0.25 OR 0.5 MG/DOSE, 2 MG/3ML SOPN Inject into the skin.    [provider]  predniSONE (DELTASONE) 10 MG tablet Take 5 tablets (50 mg total) by mouth daily with breakfast. Take 50 mg daily --tpaer by 10 mg daily then stop 09/30/23   Enedina Finner, MD  rOPINIRole (REQUIP) 2 MG tablet Take 2 mg by mouth in the morning and at bedtime.     [provider]  sodium chloride (OCEAN) 0.65 % SOLN nasal spray Place 1 spray into both nostrils as needed for up to 5 days for congestion. 06/30/23 07/05/23  Lurene Shadow, MD  VENTOLIN HFA 108 (90 Base) MCG/ACT inhaler Inhale 2 puffs into the lungs every 4 (four) hours as needed for wheezing or shortness of breath. 09/29/23   Enedina Finner, MD      VITAL SIGNS:  Blood pressure (!) 162/83, pulse 88, temperature 98.3 F (36.8 C), temperature source Oral, resp. rate 18, height 5\' 2"  (1.575 m), weight 124.7 kg, last menstrual period 01/04/2018, SpO2 100%.  PHYSICAL EXAMINATION:  Physical Exam  GENERAL:  63 y.o.-year-old patient lying in the bed with no acute distress.  EYES: Pupils equal, round, reactive to light and accommodation. No scleral icterus. Extraocular muscles intact.  HEENT: Head atraumatic, normocephalic. Oropharynx and nasopharynx clear.  NECK:  Supple, no jugular venous distention. No thyroid enlargement, no tenderness.  LUNGS: Normal breath sounds bilaterally, no wheezing, rales,rhonchi or crepitation. No use of accessory muscles of respiration.  CARDIOVASCULAR: Regular rate and rhythm, S1, S2 normal. No murmurs, rubs, or gallops.  ABDOMEN: Soft, nondistended, nontender. Bowel sounds present.  No organomegaly or mass.  EXTREMITIES: No pedal edema, cyanosis, or clubbing.  NEUROLOGIC: Cranial nerves II through XII are intact. Muscle strength 5/5 in all extremities. Sensation intact. Gait not checked.  PSYCHIATRIC: The patient is alert and oriented x 3.  Normal affect and good eye contact. SKIN: No obvious rash, lesion, or ulcer.   LABORATORY PANEL:   CBC Recent Labs  Lab 11/05/23 1541  WBC 8.3  HGB 13.4  HCT 41.6  PLT 277   ------------------------------------------------------------------------------------------------------------------  Chemistries  Recent Labs  Lab 11/05/23 1541  NA 135  K 3.9  CL 99  CO2 29  GLUCOSE 227*  BUN 10  CREATININE 0.70  CALCIUM 9.0  AST 12*  ALT 12  ALKPHOS 80  BILITOT 0.9   ------------------------------------------------------------------------------------------------------------------  Cardiac Enzymes No results for input(s): "TROPONINI" in  the last 168 hours. ------------------------------------------------------------------------------------------------------------------  RADIOLOGY:  CT ABDOMEN PELVIS W CONTRAST Result Date: 11/05/2023 CLINICAL DATA:  Acute nonlocalized abdominal pain. Patient reports pain for 2 months, progressive. EXAM: CT ABDOMEN AND PELVIS WITH CONTRAST TECHNIQUE: Multidetector CT imaging of the abdomen and pelvis was performed using the standard protocol following bolus administration of intravenous contrast. RADIATION DOSE REDUCTION: This exam was performed according to the departmental dose-optimization program which includes automated exposure control, adjustment of the mA and/or kV according to patient size and/or use of iterative reconstruction technique. CONTRAST:  OMNIPAQUE IOHEXOL 350 MG/ML SOLN COMPARISON:  CT 08/21/2023, MRI 08/24/2023 FINDINGS: Lower chest: No basilar airspace disease or pleural effusion. Hepatobiliary: Multiple hypodense hepatic lesions, increased in number and size from  prior imaging. Motion artifact limits detailed assessment. Largest lesion is in the inferior right hepatic lobe measuring 2.6 cm, series 2, image 35, not previously demonstrated on CT. Subcapsular lesion in the right lobe measures 19 mm, series 2, image 17, not previously demonstrated on CT. Lesion in the left lobe measures 16 mm, series 2, image 18, not previously demonstrated on CT. Cholecystectomy without biliary dilatation. Pancreas: Hypodense lesion in the pancreatic tail measures 7.2 x 3.9 cm, previously 5 5 x 3.7 cm. No adjacent inflammation. There is no proximal pancreatic ductal dilatation, motion limited. Spleen: Hypodense lesion in the anterior spleen is new from prior imaging, 13 mm series 2, image 7. Adrenals/Urinary Tract: No adrenal nodule. No hydronephrosis or renal calculi. No suspicious renal lesion. Unremarkable urinary bladder. Stomach/Bowel: Laxity of the anterior abdominal wall with midline ventral abdominal wall hernia containing small and to a lesser extent large bowel. There is no bowel obstruction or inflammation. Moderate volume of stool in the colon. Scattered colonic diverticula without diverticulitis. The stomach is nondistended. Vascular/Lymphatic: Minor aortic atherosclerosis. No aortic aneurysm. No definite enlarged lymph nodes on this motion limited exam. Reproductive: Status post hysterectomy. No adnexal masses. Other: No ascites. Small fat containing periumbilical hernia. No definite omental thickening. Musculoskeletal: Advanced degenerative change throughout the spine. Advanced bilateral hip arthropathy. Advanced bilateral sacroiliac arthropathy. No evidence of focal bone lesion or acute osseous findings. IMPRESSION: 1. Multiple hypodense hepatic lesions, increased in number and size from prior imaging, highly suspicious for metastatic disease. 2. Hypodense lesion in the pancreatic tail measures 7.2 x 3.9 cm, increased in size from prior. The increase in size favors pancreatic  neoplasm, with additional etiologies less likely. 3. New hypodense lesion in the anterior spleen, 13 mm, suspicious for metastatic disease. 4. Laxity of the anterior abdominal wall with midline ventral abdominal wall hernia containing small and to a lesser extent large bowel. No bowel obstruction or inflammation. 5. Colonic diverticulosis without diverticulitis. Aortic Atherosclerosis (ICD10-I70.0). Electronically Signed   By: Narda Rutherford M.D.   On: 11/05/2023 23:00      IMPRESSION AND PLAN:  Assessment and Plan: No notes have been filed under this hospital service. Service: Hospitalist      DVT prophylaxis: Lovenox***  Advanced Care Planning:  Code Status: full code***  Family Communication:  The plan of care was discussed in details with the patient (and family). I answered all questions. The patient agreed to proceed with the above mentioned plan. Further management will depend upon hospital course. Disposition Plan: Back to previous home environment Consults called: none***  All the records are reviewed and case discussed with ED provider.  Status is: Observation {Observation:23811}   At the time of the admission, it appears that the appropriate admission status for this  patient is inpatient.  This is judged to be reasonable and necessary in order to provide the required intensity of service to ensure the patient's safety given the presenting symptoms, physical exam findings and initial radiographic and laboratory data in the context of comorbid conditions.  The patient requires inpatient status due to high intensity of service, high risk of further deterioration and high frequency of surveillance required.  I certify that at the time of admission, it is my clinical judgment that the patient will require inpatient hospital care extending more than 2 midnights.                            Dispo: The patient is from: Home              Anticipated d/c is to: Home               Patient currently is not medically stable to d/c.              Difficult to place patient: No  Hannah Beat M.D on 11/05/2023 at 11:41 PM  [facility]   From 7 PM-7 AM, contact night-coverage [email or web address]  CC: Primary care physician; Dudley Major, FNP

## 2023-11-05 NOTE — ED Provider Notes (Signed)
 [facility] Provider Note    Event Date/Time   First MD Initiated Contact with Patient 11/05/23 2118     (approximate)   History   Chief Complaint Abdominal Pain   HPI  Kristina Cruz is a 63 y.o. female with past medical history of hypertension, diabetes, asthma, and pancreatic mass who presents to the ED complaining of abdominal pain.  Patient reports that she has had about a month of increasing pain across her upper abdomen, primarily in the epigastric area but spreading across her entire abdomen from there.  She has been feeling nauseous and states the pain is worse when she goes to eat, has had occasional vomiting.  She denies any changes in her bowel movements and has not had any fever, dysuria, or flank pain.  She was told earlier this year that she has a mass in the area of her pancreas and she needs outpatient MRI to assess this.  She states this has been ordered however she was not able to complete the exam due to claustrophobia.     Physical Exam   Triage Vital Signs: ED Triage Vitals  Encounter Vitals Group     BP 11/05/23 1537 (!) 162/83     Systolic BP Percentile --      Diastolic BP Percentile --      Pulse Rate 11/05/23 1537 88     Resp 11/05/23 1537 18     Temp 11/05/23 1535 98.3 F (36.8 C)     Temp Source 11/05/23 1535 Oral     SpO2 11/05/23 1537 100 %     Weight 11/05/23 1538 274 lb 14.6 oz (124.7 kg)     Height 11/05/23 1538 5\' 2"  (1.575 m)     Head Circumference --      Peak Flow --      Pain Score 11/05/23 1537 10     Pain Loc --      Pain Education --      Exclude from Growth Chart --     Most recent vital signs: Vitals:   11/05/23 1535 11/05/23 1537  BP:  (!) 162/83  Pulse:  88  Resp:  18  Temp: 98.3 F (36.8 C)   SpO2:  100%    Constitutional: Alert and oriented. Eyes: Conjunctivae are normal. Head: Atraumatic. Nose: No congestion/rhinnorhea. Mouth/Throat: Mucous membranes are moist.  Cardiovascular:  Normal rate, regular rhythm. Grossly normal heart sounds.  2+ radial pulses bilaterally. Respiratory: Normal respiratory effort.  No retractions. Lungs CTAB. Gastrointestinal: Soft and diffusely tender to palpation, greatest in the epigastrium. No distention. Musculoskeletal: No lower extremity tenderness nor edema.  Neurologic:  Normal speech and language. No gross focal neurologic deficits are appreciated.    ED Results / Procedures / Treatments   Labs (all labs ordered are listed, but only abnormal results are displayed) Labs Reviewed  COMPREHENSIVE METABOLIC PANEL - Abnormal; Notable for the following components:      Result Value   Glucose, Bld 227 (*)    AST 12 (*)    All other components within normal limits  LIPASE, BLOOD  CBC  URINALYSIS, ROUTINE W REFLEX MICROSCOPIC    RADIOLOGY CT abdomen/pelvis reviewed and interpreted by me with persistent pancreatic mass and multiple liver lesions.  PROCEDURES:  Critical Care performed: No  Procedures   MEDICATIONS ORDERED IN ED: Medications  morphine (PF) 4 MG/ML injection 4 mg (4 mg Intravenous Given 11/05/23 2206)  ondansetron (ZOFRAN) injection 4 mg (4 mg Intravenous Given  11/05/23 2206)  iohexol (OMNIPAQUE) 350 MG/ML injection 100 mL (100 mLs Intravenous Contrast Given 11/05/23 2237)     IMPRESSION / MDM / ASSESSMENT AND PLAN / ED COURSE  I reviewed the triage vital signs and the nursing notes.                              63 y.o. female with past medical history of hypertension, diabetes, asthma, and pancreatic mass who presents to the ED complaining of greater than 1 month of upper abdominal pain, worse when eating.  Patient's presentation is most consistent with acute presentation with potential threat to life or bodily function.  Differential diagnosis includes, but is not limited to, gastritis, pancreatitis, hepatitis, cholecystitis, biliary colic, pancreatic mass, bowel obstruction, UTI, kidney stone.  Patient  nontoxic-appearing and in no acute distress, vital signs are unremarkable.  Her abdomen is soft but she does have significant tenderness diffusely, greatest in the upper abdomen.  We will further assess with CT imaging, treat symptomatically with IV morphine and Zofran.  Labs are reassuring with no significant anemia, leukocytosis, electrolyte abnormality, or AKI.  LFTs and lipase are also unremarkable.  CT imaging concerning for enlarging pancreatic mass along with multiple hypodense lesions in the liver and spleen concerning for metastatic disease.  Patient also with large ventral hernia but no associated obstruction noted.  Patient continues to have significant pain on reassessment and would benefit from admission to the hospital for initiation of oncology workup.  Case discussed with hospitalist for admission.      FINAL CLINICAL IMPRESSION(S) / ED DIAGNOSES   Final diagnoses:  Epigastric pain  Pancreatic mass     Rx / DC Orders   ED Discharge Orders     None        Note:  This document was prepared using Dragon voice recognition software and may include unintentional dictation errors.   Chesley Noon, MD 11/05/23 [phone]

## 2023-11-05 NOTE — ED Triage Notes (Signed)
 Pt here with abd pain x2 months but getting worse. Pt was supposed to get an MRI but was not ordered any anxiety medication so she could not complete it. Pt states pain is left sided radiating around to her right side. Pt endorses lightheadedness as well. Pt denies bleeding. Pt endorses NV.

## 2023-11-05 NOTE — ED Provider Triage Note (Signed)
 Emergency Medicine Provider Triage Evaluation Note  Kristina Cruz , a 63 y.o. female  was evaluated in triage.  Pt complains of abdominal pain for the past several weeks, worse over the past 2 weeks. Now nauseated with inability to eat or drink since last night.  Physical Exam  BP (!) 162/83   Pulse 88   Temp 98.3 F (36.8 C) (Oral)   Resp 18   Ht 5\' 2"  (1.575 m)   Wt 124.7 kg   LMP 01/04/2018   SpO2 100%   BMI 50.28 kg/m  Gen:   Awake, no distress   Resp:  Normal effort  MSK:   Moves extremities without difficulty  Other:    Medical Decision Making  Medically screening exam initiated at 3:41 PM.  Appropriate orders placed.  Kristina Cruz was informed that the remainder of the evaluation will be completed by another provider, this initial triage assessment does not replace that evaluation, and the importance of remaining in the ED until their evaluation is complete.  Patient unable to have the MRI of her abdomen because she didn't have any anti anxiety medications. Abdominal pain is worse today.    Kristina Pester, FNP 11/05/23 1546

## 2023-11-05 NOTE — Progress Notes (Signed)
 PHARMACIST - PHYSICIAN COMMUNICATION  CONCERNING:  Enoxaparin (Lovenox) for DVT Prophylaxis    RECOMMENDATION: Patient was prescribed enoxaprin 40mg  q24 hours for VTE prophylaxis.   Filed Weights   11/05/23 1538  Weight: 124.7 kg (274 lb 14.6 oz)    Body mass index is 50.28 kg/m.  Estimated Creatinine Clearance: 88.4 mL/min (by C-G formula based on SCr of 0.7 mg/dL).   Based on [facility] policy patient is candidate for enoxaparin 0.5mg /kg TBW SQ every 24 hours based on BMI being >30.  DESCRIPTION: Pharmacy has adjusted enoxaparin dose per [facility] policy.  Patient is now receiving enoxaparin 0.5 mg/kg every 24 hours   Otelia Sergeant, PharmD, [facility] 11/05/2023 11:45 PM

## 2023-11-06 ENCOUNTER — Encounter: Payer: Self-pay | Admitting: Family Medicine

## 2023-11-06 DIAGNOSIS — J449 Chronic obstructive pulmonary disease, unspecified: Secondary | ICD-10-CM

## 2023-11-06 DIAGNOSIS — I10 Essential (primary) hypertension: Secondary | ICD-10-CM

## 2023-11-06 DIAGNOSIS — E785 Hyperlipidemia, unspecified: Secondary | ICD-10-CM

## 2023-11-06 DIAGNOSIS — Z803 Family history of malignant neoplasm of breast: Secondary | ICD-10-CM | POA: Diagnosis not present

## 2023-11-06 DIAGNOSIS — E66813 Obesity, class 3: Secondary | ICD-10-CM | POA: Diagnosis present

## 2023-11-06 DIAGNOSIS — R1084 Generalized abdominal pain: Secondary | ICD-10-CM

## 2023-11-06 DIAGNOSIS — Z79899 Other long term (current) drug therapy: Secondary | ICD-10-CM | POA: Diagnosis not present

## 2023-11-06 DIAGNOSIS — C259 Malignant neoplasm of pancreas, unspecified: Secondary | ICD-10-CM | POA: Diagnosis not present

## 2023-11-06 DIAGNOSIS — Z66 Do not resuscitate: Secondary | ICD-10-CM | POA: Diagnosis present

## 2023-11-06 DIAGNOSIS — Z8616 Personal history of COVID-19: Secondary | ICD-10-CM | POA: Diagnosis not present

## 2023-11-06 DIAGNOSIS — Z8701 Personal history of pneumonia (recurrent): Secondary | ICD-10-CM | POA: Diagnosis not present

## 2023-11-06 DIAGNOSIS — R112 Nausea with vomiting, unspecified: Secondary | ICD-10-CM | POA: Diagnosis present

## 2023-11-06 DIAGNOSIS — F40240 Claustrophobia: Secondary | ICD-10-CM | POA: Diagnosis present

## 2023-11-06 DIAGNOSIS — C252 Malignant neoplasm of tail of pancreas: Secondary | ICD-10-CM | POA: Diagnosis present

## 2023-11-06 DIAGNOSIS — G893 Neoplasm related pain (acute) (chronic): Secondary | ICD-10-CM | POA: Diagnosis present

## 2023-11-06 DIAGNOSIS — R160 Hepatomegaly, not elsewhere classified: Secondary | ICD-10-CM

## 2023-11-06 DIAGNOSIS — Z7984 Long term (current) use of oral hypoglycemic drugs: Secondary | ICD-10-CM | POA: Diagnosis not present

## 2023-11-06 DIAGNOSIS — K8689 Other specified diseases of pancreas: Secondary | ICD-10-CM | POA: Diagnosis not present

## 2023-11-06 DIAGNOSIS — R52 Pain, unspecified: Secondary | ICD-10-CM | POA: Diagnosis present

## 2023-11-06 DIAGNOSIS — J4489 Other specified chronic obstructive pulmonary disease: Secondary | ICD-10-CM | POA: Diagnosis present

## 2023-11-06 DIAGNOSIS — G2581 Restless legs syndrome: Secondary | ICD-10-CM | POA: Diagnosis present

## 2023-11-06 DIAGNOSIS — D7389 Other diseases of spleen: Secondary | ICD-10-CM

## 2023-11-06 DIAGNOSIS — R1013 Epigastric pain: Principal | ICD-10-CM

## 2023-11-06 DIAGNOSIS — C787 Secondary malignant neoplasm of liver and intrahepatic bile duct: Secondary | ICD-10-CM | POA: Diagnosis present

## 2023-11-06 DIAGNOSIS — Z515 Encounter for palliative care: Secondary | ICD-10-CM

## 2023-11-06 DIAGNOSIS — Z8041 Family history of malignant neoplasm of ovary: Secondary | ICD-10-CM

## 2023-11-06 DIAGNOSIS — E1142 Type 2 diabetes mellitus with diabetic polyneuropathy: Secondary | ICD-10-CM

## 2023-11-06 DIAGNOSIS — E1165 Type 2 diabetes mellitus with hyperglycemia: Secondary | ICD-10-CM | POA: Diagnosis present

## 2023-11-06 DIAGNOSIS — Z91010 Allergy to peanuts: Secondary | ICD-10-CM | POA: Diagnosis not present

## 2023-11-06 DIAGNOSIS — K219 Gastro-esophageal reflux disease without esophagitis: Secondary | ICD-10-CM | POA: Diagnosis present

## 2023-11-06 DIAGNOSIS — Z6841 Body Mass Index (BMI) 40.0 and over, adult: Secondary | ICD-10-CM | POA: Diagnosis not present

## 2023-11-06 DIAGNOSIS — Z8249 Family history of ischemic heart disease and other diseases of the circulatory system: Secondary | ICD-10-CM | POA: Diagnosis not present

## 2023-11-06 DIAGNOSIS — Z7951 Long term (current) use of inhaled steroids: Secondary | ICD-10-CM | POA: Diagnosis not present

## 2023-11-06 LAB — CBC
HCT: 40.5 % (ref 36.0–46.0)
Hemoglobin: 13.1 g/dL (ref 12.0–15.0)
MCH: 28.1 pg (ref 26.0–34.0)
MCHC: 32.3 g/dL (ref 30.0–36.0)
MCV: 86.7 fL (ref 80.0–100.0)
Platelets: 280 10*3/uL (ref 150–400)
RBC: 4.67 MIL/uL (ref 3.87–5.11)
RDW: 15.1 % (ref 11.5–15.5)
WBC: 8.8 10*3/uL (ref 4.0–10.5)
nRBC: 0 % (ref 0.0–0.2)

## 2023-11-06 LAB — CBG MONITORING, ED
Glucose-Capillary: 263 mg/dL — ABNORMAL HIGH (ref 70–99)
Glucose-Capillary: 187 mg/dL — ABNORMAL HIGH (ref 70–99)
Glucose-Capillary: 194 mg/dL — ABNORMAL HIGH (ref 70–99)

## 2023-11-06 LAB — COMPREHENSIVE METABOLIC PANEL
ALT: 10 U/L (ref 0–44)
AST: 13 U/L — ABNORMAL LOW (ref 15–41)
Albumin: 3.6 g/dL (ref 3.5–5.0)
Alkaline Phosphatase: 77 U/L (ref 38–126)
Anion gap: 8 (ref 5–15)
BUN: 11 mg/dL (ref 8–23)
CO2: 30 mmol/L (ref 22–32)
Calcium: 9.1 mg/dL (ref 8.9–10.3)
Chloride: 100 mmol/L (ref 98–111)
Creatinine, Ser: 0.78 mg/dL (ref 0.44–1.00)
GFR, Estimated: >60 mL/min (ref >60)
Glucose, Bld: 275 mg/dL — ABNORMAL HIGH (ref 70–99)
Potassium: 3.7 mmol/L (ref 3.5–5.1)
Sodium: 138 mmol/L (ref 135–145)
Total Bilirubin: 0.8 mg/dL (ref 0.0–1.2)
Total Protein: 7.2 g/dL (ref 6.5–8.1)

## 2023-11-06 LAB — URINALYSIS, ROUTINE W REFLEX MICROSCOPIC
Bilirubin Urine: NEGATIVE
Glucose, UA: 50 mg/dL — AB
Hgb urine dipstick: NEGATIVE
Ketones, ur: NEGATIVE mg/dL
Leukocytes,Ua: NEGATIVE
Nitrite: NEGATIVE
Protein, ur: NEGATIVE mg/dL
Specific Gravity, Urine: >1.046 — ABNORMAL HIGH (ref 1.005–1.030)
pH: 5 (ref 5.0–8.0)

## 2023-11-06 LAB — GLUCOSE, CAPILLARY
Glucose-Capillary: 244 mg/dL — ABNORMAL HIGH (ref 70–99)
Glucose-Capillary: 202 mg/dL — ABNORMAL HIGH (ref 70–99)

## 2023-11-06 MED ORDER — INSULIN ASPART 100 UNIT/ML IJ SOLN
0.0000 [IU] | Freq: Every day | INTRAMUSCULAR | Status: DC
Start: 2023-11-06 — End: 2023-11-10
  Administered 2023-11-06: 2 [IU] via SUBCUTANEOUS
  Administered 2023-11-07: 3 [IU] via SUBCUTANEOUS
  Administered 2023-11-08: 4 [IU] via SUBCUTANEOUS
  Administered 2023-11-09: 5 [IU] via SUBCUTANEOUS
  Filled 2023-11-06 (×4): qty 1

## 2023-11-06 MED ORDER — INSULIN ASPART 100 UNIT/ML IJ SOLN
0.0000 [IU] | Freq: Three times a day (TID) | INTRAMUSCULAR | Status: DC
  Administered 2023-11-06: 3 [IU] via SUBCUTANEOUS
  Administered 2023-11-06: 5 [IU] via SUBCUTANEOUS
  Administered 2023-11-06: 8 [IU] via SUBCUTANEOUS
  Administered 2023-11-06: 3 [IU] via SUBCUTANEOUS
  Administered 2023-11-07: 8 [IU] via SUBCUTANEOUS
  Administered 2023-11-07 (×2): 5 [IU] via SUBCUTANEOUS
  Administered 2023-11-08 (×2): 8 [IU] via SUBCUTANEOUS
  Administered 2023-11-08: 3 [IU] via SUBCUTANEOUS
  Administered 2023-11-09 – 2023-11-10 (×2): 5 [IU] via SUBCUTANEOUS
  Filled 2023-11-06 (×12): qty 1

## 2023-11-06 MED ORDER — OXYCODONE HCL 5 MG PO TABS
5.0000 mg | ORAL_TABLET | ORAL | Status: DC | PRN
  Administered 2023-11-06 – 2023-11-09 (×3): 5 mg via ORAL
  Filled 2023-11-06 (×3): qty 1

## 2023-11-06 MED ORDER — TRAMADOL HCL 50 MG PO TABS: 50.0000 mg | ORAL_TABLET | Freq: Four times a day (QID) | ORAL | Status: DC | PRN

## 2023-11-06 MED ORDER — MORPHINE SULFATE (PF) 2 MG/ML IV SOLN
2.0000 mg | INTRAVENOUS | Status: DC | PRN
  Administered 2023-11-06 – 2023-11-08 (×7): 2 mg via INTRAVENOUS
  Filled 2023-11-06 (×7): qty 1

## 2023-11-06 NOTE — Progress Notes (Signed)
   11/06/23 1400  Spiritual Encounters  Type of Visit Initial  Care provided to: Patient  Referral source Nurse (RN/NT/LPN)  Reason for visit Advance directives  OnCall Visit No  Interventions  Spiritual Care Interventions Made Established relationship of care and support;Compassionate presence  Intervention Outcomes  Outcomes Connection to spiritual care;Awareness around self/spiritual resourses  Spiritual Care Plan  Spiritual Care Issues Still Outstanding No further spiritual care needs at this time (see row info)   Chaplain took advance directives to patient and patient requested a bible. Chaplain took patient a bible.

## 2023-11-06 NOTE — ED Notes (Signed)
 Pt CAOx4, breathing normally, and normal in color. Pt sitting upright on the side of the bed and requesting pain meds for 10/10 pain. Pt was given a hospital phone to call her daughter as well.

## 2023-11-06 NOTE — Progress Notes (Signed)
 Request to IR for liver lesion biopsy.  Attending physician unavailable this afternoon to review for possible biopsy, will be reviewed Monday 3/24 by interventional radiologist. Will make patient NPO at midnight on Monday 3/24 in case we can proceed.  Plan: - Pending review still. Will have reviewed on Monday 3/24 to determine if biopsy is feasible. - NPO at midnight on 3/24 in case we can proceed. IR will contact primary team on Monday and if unable to proceed will restart diet - Lovenox doses on 3/23 and 3/24 held on [facility] for possible procedure - CBC/INR AM of 3/24  Lynnette Caffey, PA-C

## 2023-11-06 NOTE — Consult Note (Signed)
 Hematology/Oncology Consult note [phone] [phone]      Patient Care Team: Dudley Major, FNP as PCP - General (Nurse Practitioner)   Name of the patient: Kristina Cruz  [id number]  1960-10-28   REASON FOR COSULTATION:  Pancreatic mass, liver lesion History of presenting illness-  63 y.o. female with PMH listed at below who presents to ER for evaluation of generalized abdominal pain progressively getting worse over the past few months.  She has decreased appetite, partially attributed to being on Ozempic.  She has experienced nausea and vomiting without hematemesis.  She has lost about 10 to 15 pounds during the past few weeks.  Patient denies jaundice, fever, chills, chest pain, cough.   Patient was hospitalized in January 2025 due to pneumonia secondary to COVID infection. Patient had CT scan workup for abdominal pain and a CT showed possible pancreatic mass.  An MRI was advised to further characterize the mass.  MRI was performed during her hospitalization however she was not able to hold still enough.  She was recommended to follow-up outpatient and get MRI done which did not happen.  She was hospitalized in February 2025 due to acute asthma exacerbation.  11/05/2023 CT abdomen pelvis with contrast showed 1. Multiple hypodense hepatic lesions, increased in number and size from prior imaging, highly suspicious for metastatic disease. 2. Hypodense lesion in the pancreatic tail measures 7.2 x 3.9 cm, increased in size from prior. The increase in size favors pancreatic neoplasm, with additional etiologies less likely. 3. New hypodense lesion in the anterior spleen, 13 mm, suspicious for metastatic disease. 4. Laxity of the anterior abdominal wall with midline ventral abdominal wall hernia containing small and to a lesser extent large bowel. No bowel obstruction or inflammation. 5. Colonic diverticulosis without diverticulitis. Aortic Atherosclerosis    Hematology oncology was consulted for further evaluation.    Allergies  Allergen Reactions   Peanut-Containing Drug Products     Patient Active Problem List   Diagnosis Date Noted   Essential hypertension 11/06/2023   Dyslipidemia 11/06/2023   Type 2 diabetes mellitus with peripheral neuropathy (HCC) 11/06/2023   Chronic obstructive pulmonary disease (COPD) (HCC) 11/06/2023   Pancreatic cancer metastasized to liver (HCC) 11/05/2023   Acute asthma exacerbation 09/28/2023   Acute hypoxic respiratory failure (HCC) 08/21/2023   Pneumonia due to COVID-19 virus 08/21/2023   Pancreatic mass 08/21/2023   Spinal stenosis of lumbar region 08/21/2023   Degeneration of lumbar intervertebral disc 08/21/2023   Mastoid effusion, left 06/30/2023   Sinusitis 06/28/2023   Type 2 diabetes mellitus (HCC) 06/28/2023   Asthma exacerbation 06/28/2023   COPD with acute bronchitis (HCC) 04/18/2023   COPD with acute exacerbation (HCC) 04/18/2023   Respiratory failure with hypoxia (HCC) 02/10/2023   Uncontrolled type 2 diabetes mellitus with hyperglycemia, without long-term current use of insulin (HCC) 02/08/2018   Abnormal ECG 12/31/2017   Chest pain with low risk for cardiac etiology 12/31/2017   Preop cardiovascular exam 12/31/2017   SOB (shortness of breath) on exertion 12/31/2017   Restless leg syndrome 12/16/2017   Hypertension 12/16/2017   Endometrial hyperplasia without atypia, simple 12/03/2017   Endometrial polyp 11/11/2017   Post-menopausal bleeding 11/11/2017   Endometrial thickening on ultrasound 04/27/2017   Obesity, Class III, BMI 40-49.9 (morbid obesity) (HCC) 04/27/2017     Past Medical History:  Diagnosis Date   Anemia    Asthma    Back pain    Cataract    Diabetes mellitus without complication (HCC)  GERD (gastroesophageal reflux disease)    Hypertension    Morbid obesity with BMI of 60.0-69.9, adult (HCC)    Restless leg syndrome      Past Surgical History:   Procedure Laterality Date   ABDOMINAL HYSTERECTOMY     CATARACT EXTRACTION W/PHACO Left 04/13/2020   Procedure: CATARACT EXTRACTION PHACO AND INTRAOCULAR LENS PLACEMENT (IOC);  Surgeon: Elliot Cousin, MD;  Location: [facility] OR[facility];  Service: Ophthalmology;  Laterality: Left;  [place] 00:36.0 CDE 5.95 Fluid Pack lot # [id number]   HYSTEROSCOPY WITH D & C N/A 07/14/2017   Procedure: DILATATION AND CURETTAGE /HYSTEROSCOPY;  Surgeon: Nadara Mustard, MD;  Location: [facility];  Service: Gynecology;  Laterality: N/A;   POLYPECTOMY  2015    Social History   Socioeconomic History   Marital status: Single    Spouse name: Not on file   Number of children: Not on file   Years of education: Not on file   Highest education level: Not on file  Occupational History   Not on file  Tobacco Use   Smoking status: Never   Smokeless tobacco: Never  Vaping Use   Vaping status: Never Used  Substance and Sexual Activity   Alcohol use: No   Drug use: No   Sexual activity: Never    Birth control/protection: None  Other Topics Concern   Not on file  Social History Narrative   Not on file   Social Drivers of Health   Financial Resource Strain: Low Risk  (09/26/2022)   [facility]   Overall Financial Resource Strain (CARDIA)    Difficulty of Paying Living Expenses: Not hard at all  Food Insecurity: No Food Insecurity (11/06/2023)   Hunger Vital Sign    Worried About Running Out of Food in the Last Year: Never true    Ran Out of Food in the Last Year: Never true  Transportation Needs: No Transportation Needs (11/06/2023)   PRAPARE - Administrator, Civil Service (Medical): No    Lack of Transportation (Non-Medical): No  Recent Concern: Transportation Needs - Unmet Transportation Needs (09/28/2023)   PRAPARE - Administrator, Civil Service (Medical): Yes    Lack of Transportation (Non-Medical): Yes  Physical Activity: Not on  file  Stress: Not on file  Social Connections: Socially Isolated (11/06/2023)   Social Connection and Isolation Panel [NHANES]    Frequency of Communication with Friends and Family: More than three times a week    Frequency of Social Gatherings with Friends and Family: More than three times a week    Attends Religious Services: Never    Database administrator or Organizations: No    Attends Banker Meetings: Never    Marital Status: Never married  Intimate Partner Violence: Not At Risk (11/06/2023)   Humiliation, Afraid, Rape, and Kick questionnaire    Fear of Current or Ex-Partner: No    Emotionally Abused: No    Physically Abused: No    Sexually Abused: No     Family History  Problem Relation Age of Onset   Breast cancer Mother 16   Diabetes Mother    Hypertension Mother    Ovarian cancer Paternal Aunt        ?   Diabetes Father    Hypertension Father      Current Facility-Administered Medications:    0.9 %  sodium chloride infusion, , Intravenous, Continuous, Mansy, Vernetta Honey, MD, Last  Rate: 100 mL/hr at 11/06/23 1004, New Bag at 11/06/23 1004   acetaminophen (TYLENOL) tablet 650 mg, 650 mg, Oral, Q6H PRN **OR** acetaminophen (TYLENOL) suppository 650 mg, 650 mg, Rectal, Q6H PRN, Mansy, Jan A, MD   amLODipine (NORVASC) tablet 10 mg, 10 mg, Oral, Daily, Mansy, Jan A, MD, 10 mg at 11/06/23 1235   atorvastatin (LIPITOR) tablet 20 mg, 20 mg, Oral, Daily, Mansy, Jan A, MD, 20 mg at 11/06/23 0941   enoxaparin (LOVENOX) injection 60 mg, 60 mg, Subcutaneous, Q24H, Mansy, Jan A, MD, 60 mg at 11/06/23 4098   EPINEPHrine (EPI-PEN) injection 0.3 mg, 0.3 mg, Intramuscular, PRN, Mansy, Jan A, MD   fluticasone (FLONASE) 50 MCG/ACT nasal spray 1 spray, 1 spray, Each Nare, Daily, Mansy, Jan A, MD, 1 spray at 11/06/23 1021   fluticasone furoate-vilanterol (BREO ELLIPTA) 100-25 MCG/ACT 1 puff, 1 puff, Inhalation, Daily, Mansy, Jan A, MD, 1 puff at 11/06/23 1191   furosemide (LASIX)  tablet 40 mg, 40 mg, Oral, Daily, Mansy, Jan A, MD   gabapentin (NEURONTIN) capsule 600 mg, 600 mg, Oral, BID, Mansy, Jan A, MD, 600 mg at 11/06/23 1235   guaiFENesin (MUCINEX) 12 hr tablet 600 mg, 600 mg, Oral, BID PRN, Mansy, Jan A, MD   insulin aspart (novoLOG) injection 0-15 Units, 0-15 Units, Subcutaneous, TID WC, Mansy, Jan A, MD, 3 Units at 11/06/23 1319   insulin aspart (novoLOG) injection 0-5 Units, 0-5 Units, Subcutaneous, QHS, Mansy, Jan A, MD   ipratropium-albuterol (DUONEB) 0.5-2.5 (3) MG/3ML nebulizer solution 3 mL, 3 mL, Nebulization, Q4H PRN, Mansy, Jan A, MD   magnesium hydroxide (MILK OF MAGNESIA) suspension 30 mL, 30 mL, Oral, Daily PRN, Mansy, Jan A, MD   melatonin tablet 10 mg, 10 mg, Oral, QHS PRN, Mansy, Jan A, MD   morphine (PF) 2 MG/ML injection 2 mg, 2 mg, Intravenous, Q4H PRN, Mansy, Jan A, MD, 2 mg at 11/06/23 1235   ondansetron (ZOFRAN) tablet 4 mg, 4 mg, Oral, Q6H PRN **OR** ondansetron (ZOFRAN) injection 4 mg, 4 mg, Intravenous, Q6H PRN, Mansy, Jan A, MD, 4 mg at 11/06/23 4782   rOPINIRole (REQUIP) tablet 2 mg, 2 mg, Oral, BID, Mansy, Jan A, MD, 2 mg at 11/06/23 0941   traMADol (ULTRAM) tablet 50 mg, 50 mg, Oral, Q6H PRN, Mansy, Jan A, MD   traZODone (DESYREL) tablet 25 mg, 25 mg, Oral, QHS PRN, Mansy, Jan A, MD  Review of Systems  Constitutional:  Positive for appetite change, fatigue and unexpected weight change. Negative for chills and fever.  HENT:   Negative for hearing loss and voice change.   Eyes:  Negative for eye problems.  Respiratory:  Negative for chest tightness and cough.   Cardiovascular:  Negative for chest pain.  Gastrointestinal:  Positive for abdominal pain, nausea and vomiting. Negative for abdominal distention and blood in stool.  Endocrine: Negative for hot flashes.  Genitourinary:  Negative for difficulty urinating and frequency.   Musculoskeletal:  Negative for arthralgias.  Skin:  Negative for itching and rash.  Neurological:  Negative  for extremity weakness.  Hematological:  Negative for adenopathy.  Psychiatric/Behavioral:  Negative for confusion.     PHYSICAL EXAM Vitals:   11/06/23 1235 11/06/23 1300 11/06/23 1414 11/06/23 1441  BP: (!) 142/74 (!) 140/76  124/71  Pulse:  68  76  Resp:  18  18  Temp:   98.1 F (36.7 C) 97.8 F (36.6 C)  TempSrc:   Oral   SpO2:  91%  95%  Weight:      Height:       Physical Exam Constitutional:      General: She is not in acute distress.    Appearance: She is obese. She is not diaphoretic.  HENT:     Head: Normocephalic and atraumatic.     Nose: Nose normal.  Eyes:     General: No scleral icterus.    Pupils: Pupils are equal, round, and reactive to light.  Cardiovascular:     Rate and Rhythm: Normal rate and regular rhythm.  Pulmonary:     Effort: Pulmonary effort is normal. No respiratory distress.     Breath sounds: No wheezing.     Comments: Decreased breath sound bilaterally Abdominal:     General: There is no distension.     Palpations: Abdomen is soft.     Tenderness: There is abdominal tenderness.  Musculoskeletal:        General: Normal range of motion.     Cervical back: Normal range of motion and neck supple.  Skin:    General: Skin is warm and dry.     Findings: No erythema.  Neurological:     Mental Status: She is alert and oriented to person, place, and time. Mental status is at baseline.     Motor: No abnormal muscle tone.  Psychiatric:        Mood and Affect: Mood and affect normal.       LABORATORY STUDIES    Latest Ref Rng & Units 11/06/2023    4:49 AM 11/05/2023    3:41 PM 09/29/2023    7:09 AM  CBC  WBC 4.0 - 10.5 K/uL 8.8  8.3  7.9   Hemoglobin 12.0 - 15.0 g/dL 81.1  91.4  78.2   Hematocrit 36.0 - 46.0 % 40.5  41.6  36.8   Platelets 150 - 400 K/uL 280  277  288       Latest Ref Rng & Units 11/06/2023    4:49 AM 11/05/2023    3:41 PM 09/29/2023    7:09 AM  CMP  Glucose 70 - 99 mg/dL 956  213  086   BUN 8 - 23 mg/dL 11  10  14     Creatinine 0.44 - 1.00 mg/dL 5.78  4.69  6.29   Sodium 135 - 145 mmol/L 138  135  136   Potassium 3.5 - 5.1 mmol/L 3.7  3.9  3.6   Chloride 98 - 111 mmol/L 100  99  100   CO2 22 - 32 mmol/L 30  29  27    Calcium 8.9 - 10.3 mg/dL 9.1  9.0  8.9   Total Protein 6.5 - 8.1 g/dL 7.2  7.8    Total Bilirubin 0.0 - 1.2 mg/dL 0.8  0.9    Alkaline Phos 38 - 126 U/L 77  80    AST 15 - 41 U/L 13  12    ALT 0 - 44 U/L 10  12       RADIOGRAPHIC STUDIES: I have personally reviewed the radiological images as listed and agreed with the findings in the report. CT ABDOMEN PELVIS W CONTRAST Result Date: 11/05/2023 CLINICAL DATA:  Acute nonlocalized abdominal pain. Patient reports pain for 2 months, progressive. EXAM: CT ABDOMEN AND PELVIS WITH CONTRAST TECHNIQUE: Multidetector CT imaging of the abdomen and pelvis was performed using the standard protocol following bolus administration of intravenous contrast. RADIATION DOSE REDUCTION: This exam was performed according to the departmental dose-optimization program which includes  automated exposure control, adjustment of the mA and/or kV according to patient size and/or use of iterative reconstruction technique. CONTRAST:  OMNIPAQUE IOHEXOL 350 MG/ML SOLN COMPARISON:  CT 08/21/2023, MRI 08/24/2023 FINDINGS: Lower chest: No basilar airspace disease or pleural effusion. Hepatobiliary: Multiple hypodense hepatic lesions, increased in number and size from prior imaging. Motion artifact limits detailed assessment. Largest lesion is in the inferior right hepatic lobe measuring 2.6 cm, series 2, image 35, not previously demonstrated on CT. Subcapsular lesion in the right lobe measures 19 mm, series 2, image 17, not previously demonstrated on CT. Lesion in the left lobe measures 16 mm, series 2, image 18, not previously demonstrated on CT. Cholecystectomy without biliary dilatation. Pancreas: Hypodense lesion in the pancreatic tail measures 7.2 x 3.9 cm, previously 5 5 x  3.7 cm. No adjacent inflammation. There is no proximal pancreatic ductal dilatation, motion limited. Spleen: Hypodense lesion in the anterior spleen is new from prior imaging, 13 mm series 2, image 7. Adrenals/Urinary Tract: No adrenal nodule. No hydronephrosis or renal calculi. No suspicious renal lesion. Unremarkable urinary bladder. Stomach/Bowel: Laxity of the anterior abdominal wall with midline ventral abdominal wall hernia containing small and to a lesser extent large bowel. There is no bowel obstruction or inflammation. Moderate volume of stool in the colon. Scattered colonic diverticula without diverticulitis. The stomach is nondistended. Vascular/Lymphatic: Minor aortic atherosclerosis. No aortic aneurysm. No definite enlarged lymph nodes on this motion limited exam. Reproductive: Status post hysterectomy. No adnexal masses. Other: No ascites. Small fat containing periumbilical hernia. No definite omental thickening. Musculoskeletal: Advanced degenerative change throughout the spine. Advanced bilateral hip arthropathy. Advanced bilateral sacroiliac arthropathy. No evidence of focal bone lesion or acute osseous findings. IMPRESSION: 1. Multiple hypodense hepatic lesions, increased in number and size from prior imaging, highly suspicious for metastatic disease. 2. Hypodense lesion in the pancreatic tail measures 7.2 x 3.9 cm, increased in size from prior. The increase in size favors pancreatic neoplasm, with additional etiologies less likely. 3. New hypodense lesion in the anterior spleen, 13 mm, suspicious for metastatic disease. 4. Laxity of the anterior abdominal wall with midline ventral abdominal wall hernia containing small and to a lesser extent large bowel. No bowel obstruction or inflammation. 5. Colonic diverticulosis without diverticulitis. Aortic Atherosclerosis (ICD10-I70.0). Electronically Signed   By: Narda Rutherford M.D.   On: 11/05/2023 23:00   DG Chest 1 View Result Date:  09/28/2023 CLINICAL DATA:  Cough.  Shortness of breath. EXAM: CHEST  1 VIEW COMPARISON:  08/21/2023. FINDINGS: Bilateral lung fields are clear. Apparent blunting of left lateral costophrenic angle corresponds to prominent epicardial fat pad. No large pleural effusion or pneumothorax. Normal cardio-mediastinal silhouette. No acute osseous abnormalities. The soft tissues are within normal limits. IMPRESSION: No active disease. Electronically Signed   By: Jules Schick M.D.   On: 09/28/2023 13:59   MR ABDOMEN WO CONTRAST Result Date: 08/24/2023 CLINICAL DATA:  Pancreatic mass seen on prior CT scan. EXAM: MRI ABDOMEN WITHOUT CONTRAST TECHNIQUE: Multiplanar multisequence MR imaging was performed without the administration of intravenous contrast. COMPARISON:  CT scan abdomen and pelvis from 08/21/2023. FINDINGS: Examination is moderately limited due to patient's motion during data acquisition and technique. Intravenous contrast was not administered which limits evaluation for suspected pancreatic tumor. Also, MRCP images were not provided. Only coronal T2 haste, axial true FISP, in/out of phase images and diffusion/ADC images are submitted for review. Lower chest: Unremarkable MR appearance to the lung bases. No pleural effusion. No pericardial effusion. Normal heart  size. Hepatobiliary: The liver is normal in size and noncirrhotic in configuration. There is diffuse heterogeneous signal intensity of the liver. There is mild diffuse hepatic steatosis. There are several ill-defined, predominantly peripheral T1 hypointense and T2 hyperintense areas in the right lobe, which do not exhibit diffusion restriction. These are incompletely characterized on this limited exam. No intrahepatic or extrahepatic bile duct dilatation. No choledocholithiasis. Status post cholecystectomy. Pancreas: There is a heterogeneous masslike thickening of the pancreatic body/proximal tail measuring 4.0 x 6.5 cm orthogonally on axial plane. There  is subtle prominence of main pancreatic duct in the remaining tail region. There is no abnormal diffusion restriction in this lesion on high B value images. The lesion remains indeterminate on this limited exam. Further evaluation with nonemergent MRI abdomen with intravenous contrast as per pancreatic mass protocol is recommended preferably as an outpatient, when patient is in better condition and can hold breath for longer duration. Spleen:  Within normal limits in size and appearance. No focal mass. Adrenals/Urinary Tract: Unremarkable adrenal glands. No hydroureteronephrosis. No suspicious renal mass. Stomach/Bowel: Visualized portions within the abdomen are unremarkable. No disproportionate dilation of bowel loops. Vascular/Lymphatic: No pathologically enlarged lymph nodes identified. No abdominal aortic aneurysm demonstrated. No ascites. Other:  None. Musculoskeletal: No suspicious bone lesions identified. IMPRESSION: 1. Moderately suboptimal unenhanced exam. 2. Heterogeneous masslike thickening of the pancreatic body/tail again seen. The lesion remains indeterminate on this exam. Differential diagnosis includes pancreatic tumor, autoimmune pancreatitis, mass forming chronic pancreatitis, etc. Please see above for follow-up recommendations. 3. Mild diffuse hepatic steatosis. There are indeterminate areas in the liver, which can also be better evaluated on the contrast-enhanced MRI abdomen. 4. Other observations, as described above. Electronically Signed   By: Jules Schick M.D.   On: 08/24/2023 09:33   CT Angio Chest PE W and/or Wo Contrast Result Date: 08/21/2023 CLINICAL DATA:  Hypoxia, shortness of breath and elevated D-dimer. Fever and productive cough. EXAM: CT ANGIOGRAPHY CHEST WITH CONTRAST TECHNIQUE: Multidetector CT imaging of the chest was performed using the standard protocol during bolus administration of intravenous contrast. Multiplanar CT image reconstructions and MIPs were obtained to  evaluate the vascular anatomy. RADIATION DOSE REDUCTION: This exam was performed according to the departmental dose-optimization program which includes automated exposure control, adjustment of the mA and/or kV according to patient size and/or use of iterative reconstruction technique. CONTRAST:  75mL OMNIPAQUE IOHEXOL 350 MG/ML SOLN COMPARISON:  06/28/2023 FINDINGS: Cardiovascular: Stable enlarged heart. Normally opacified pulmonary arteries with no pulmonary arterial filling defects seen. Common trunk of the left common carotid artery and the right innominate artery. Mediastinum/Nodes: Interval enlarged subcarinal and right hilar lymph nodes including a subcarinal node with a short axis diameter of 16 mm on image number 134/5 and right hilar node with a short axis diameter of 18 mm on image number 129/5. Unremarkable thyroid gland, esophagus and trachea. Lungs/Pleura: Interval multiple areas of patchy airspace opacity in the right upper lobe and to a lesser degree in the right lower lobe and left upper lobe. Mild bilateral lower lobe linear atelectasis. No pleural fluid. Upper Abdomen: Cholecystectomy clips. 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 Review of the MIP images confirms the above findings. IMPRESSION: 1. No pulmonary embolism. 2. Interval multifocal pneumonia. 3. Interval enlarged subcarinal and right hilar lymph nodes compatible with reactive nodes related to the patient's pneumonia. 4. Stable cardiomegaly. Electronically Signed   By: Beckie Salts M.D.   On: 08/21/2023 17:29   CT ABDOMEN PELVIS W  CONTRAST Result Date: 08/21/2023 CLINICAL DATA:  Abdominal pain, most severe in the right lower and left lower quadrants with associated nausea, vomiting and diarrhea. EXAM: CT ABDOMEN AND PELVIS WITH CONTRAST TECHNIQUE: Multidetector CT imaging of the abdomen and pelvis was performed using the standard protocol following bolus administration of intravenous  contrast. RADIATION DOSE REDUCTION: This exam was performed according to the departmental dose-optimization program which includes automated exposure control, adjustment of the mA and/or kV according to patient size and/or use of iterative reconstruction technique. CONTRAST:  OMNIPAQUE IOHEXOL 300 MG/ML  SOLN COMPARISON:  10/16/2022 FINDINGS: Comment: Exam detail is diminished due to motion artifact predominantly affecting the upper abdominal structures. Lower chest: Patchy ground-glass and airspace densities identified within both lower lobes. These are new compared with the previous exam and are concerning for underlying inflammatory/infectious process. Hepatobiliary: No suspicious liver abnormality. Status post cholecystectomy. No biliary ductal dilatation. Pancreas: Masslike thickening involving the proximal and mid tail of pancreas with relative hypoenhancement measures approximately 3.9 x 5.5 cm, image 6/7. In the absence of signs/symptoms of acute pancreatitis underlying neoplastic process cannot be excluded. Spleen: Normal in size without focal abnormality. Adrenals/Urinary Tract: Normal appearance of the adrenal glands. The kidneys are normal. No nephrolithiasis or hydronephrosis. Urinary bladder is unremarkable. Stomach/Bowel: Stomach appears nondistended. There is no dilated loops of large or small bowel to suggest obstruction. No bowel wall thickening or inflammation. Sigmoid diverticulosis without signs of acute diverticulitis. Moderate retained stool identified within the colon. Vascular/Lymphatic: Normal caliber of the abdominal aorta. No signs of abdominopelvic adenopathy. Reproductive: Status post hysterectomy. No adnexal masses. Other: No free fluid or fluid collections. Marked diastasis recti with ventral herniation of the large and small bowel loops. Periumbilical hernia is containing fat only. No free fluid or fluid collections. Musculoskeletal: Severe degenerative changes involving both  hips and both SI joints. Multilevel lumbar spondylosis. No acute or suspicious osseous findings. IMPRESSION: 1. Masslike thickening involving the proximal and mid tail of pancreas with relative hypoenhancement measures approximately 3.9 x 5.5 cm. In the absence of signs/symptoms of acute pancreatitis underlying neoplastic process cannot be excluded. Further evaluation with nonemergent contrast enhanced MRI of the abdomen is advised. Note: Given patient's body habitus and the motion artifact observed on the current exam an MRI obtained at this time is likely to be severely limited and likely nondiagnostic. MRI should be obtained only once the patient is clinically stable, and is able to remain motionless and breath hold. 2. Patchy ground-glass and airspace densities identified within both lower lobes. These are new compared with the previous exam and are concerning for underlying inflammatory/infectious process. 3. Sigmoid diverticulosis without signs of acute diverticulitis. 4. Marked diastasis recti with ventral herniation of the large and small bowel loops. 5. Periumbilical hernia is containing fat only. Electronically Signed   By: Signa Kell M.D.   On: 08/21/2023 12:05   DG Chest 2 View Result Date: 08/21/2023 CLINICAL DATA:  Shortness of breath. EXAM: CHEST - 2 VIEW COMPARISON:  06/28/2023. FINDINGS: Low lung volume. There is mild-to-moderate pulmonary edema, slightly accentuated by low lung volume. There is nonspecific heterogeneous opacity overlying the right lower lung zone, which may represent atelectasis and/or pneumonia. Bilateral lung fields are otherwise clear. No acute consolidation or lung collapse. Bilateral costophrenic angles are clear. Stable cardio-mediastinal silhouette. No acute osseous abnormalities. The soft tissues are within normal limits. IMPRESSION: 1. Mild pulmonary edema, slightly accentuated by low lung volume. 2. Nonspecific heterogeneous opacities overlying the right lower lung  zone  may represent atelectasis and/or pneumonia. Correlate clinically. Electronically Signed   By: Jules Schick M.D.   On: 08/21/2023 10:12     Assessment and plan-   # Enlarging pancreatic tail mass with multiple liver lesions, lesion increased in number and size comparing to prior imaging.  New spleen lesion.  Clinically suspicious for malignancy, especially metastatic pancreatic cancer.   Check CA 19-9, CEA. Patient is interested in diagnostic workup and further treatments. I recommend to consult IR for liver lesion biopsy to establish tissue diagnosis. Future treatments plan depending on final pathological results.  # Abdominal pain.  Recommend to liberate Oxycodone 5mg -10mg  Q4-6h PRN with bowel regimen.  Consider adding steroid if pain is not improved.  Outpatient follow up for pain assessment and decision of celiac block.   Thank you for allowing me to participate in the care of this patient.   Rickard Patience, MD, PhD Hematology Oncology 11/06/2023

## 2023-11-06 NOTE — Assessment & Plan Note (Signed)
 -  We will continue statin therapy.

## 2023-11-06 NOTE — Assessment & Plan Note (Signed)
-   We will continue antihypertensive therapy.

## 2023-11-06 NOTE — ED Notes (Signed)
Pt given sandwich tray 

## 2023-11-06 NOTE — Consult Note (Signed)
 Palliative Medicine [facility] at [facility] [phone] Fax:([phone]) [phone]   Name: Kristina Cruz Date: 11/06/2023 MRN: [id number]  DOB: 1961/01/25  Patient Care Team: Dudley Major, FNP as PCP - General (Nurse Practitioner)    REASON FOR CONSULTATION: Kristina Cruz is a 63 y.o. female with multiple medical problems including diabetes, hypertension, restless leg syndrome, and pancreatic mass diagnosed in January 2025 who was lost to follow-up.  Patient admitted 11/05/2023 with intractable abdominal pain.  CT revealed increased size of pancreatic tail mass with new hypodense lesion in the anterior spleen and multiple hepatic masses concerning for metastatic disease.  Palliative care consulted to address goals manage ongoing symptoms.  SOCIAL HISTORY:     reports that she has never smoked. She has never used smokeless tobacco. She reports that she does not drink alcohol and does not use drugs.  Patient never married.  Lives at home alone.  She has a daughter who is involved.  Patient also has caregivers for 20 hours weekly.  Patient previously worked as a Lawyer and in a Futures trader.  ADVANCE DIRECTIVES:  Does not have  CODE STATUS: DNR  PAST MEDICAL HISTORY: Past Medical History:  Diagnosis Date   Anemia    Asthma    Back pain    Cataract    Diabetes mellitus without complication (HCC)    GERD (gastroesophageal reflux disease)    Hypertension    Morbid obesity with BMI of 60.0-69.9, adult (HCC)    Restless leg syndrome     PAST SURGICAL HISTORY:  Past Surgical History:  Procedure Laterality Date   ABDOMINAL HYSTERECTOMY     CATARACT EXTRACTION W/PHACO Left 04/13/2020   Procedure: CATARACT EXTRACTION PHACO AND INTRAOCULAR LENS PLACEMENT (IOC);  Surgeon: Elliot Cousin, MD;  Location: [facility] ORS;  Service: Ophthalmology;  Laterality: Left;  [place] 00:36.0 CDE 5.95 Fluid Pack lot # [id number]   HYSTEROSCOPY WITH D & C N/A 07/14/2017    Procedure: DILATATION AND CURETTAGE /HYSTEROSCOPY;  Surgeon: Nadara Mustard, MD;  Location: [facility] ORS;  Service: Gynecology;  Laterality: N/A;   POLYPECTOMY  2015    HEMATOLOGY/ONCOLOGY HISTORY:  Oncology History   No history exists.    ALLERGIES:  is allergic to peanut-containing drug products.  MEDICATIONS:  Current Facility-Administered Medications  Medication Dose Route Frequency Provider Last Rate Last Admin   0.9 %  sodium chloride infusion   Intravenous Continuous Mansy, Jan A, MD 100 mL/hr at 11/06/23 1004 New Bag at 11/06/23 1004   acetaminophen (TYLENOL) tablet 650 mg  650 mg Oral Q6H PRN Mansy, Jan A, MD       Or   acetaminophen (TYLENOL) suppository 650 mg  650 mg Rectal Q6H PRN Mansy, Jan A, MD       amLODipine (NORVASC) tablet 10 mg  10 mg Oral Daily Mansy, Jan A, MD   10 mg at 11/06/23 1235   atorvastatin (LIPITOR) tablet 20 mg  20 mg Oral Daily Mansy, Jan A, MD   20 mg at 11/06/23 0941   enoxaparin (LOVENOX) injection 60 mg  60 mg Subcutaneous Q24H Mansy, Jan A, MD   60 mg at 11/06/23 0942   EPINEPHrine (EPI-PEN) injection 0.3 mg  0.3 mg Intramuscular PRN Mansy, Jan A, MD       fluticasone (FLONASE) 50 MCG/ACT nasal spray 1 spray  1 spray Each Nare Daily Mansy, Jan A, MD   1 spray at 11/06/23 1021   fluticasone furoate-vilanterol (BREO ELLIPTA) 100-25 MCG/ACT  1 puff  1 puff Inhalation Daily Mansy, Jan A, MD   1 puff at 11/06/23 0853   furosemide (LASIX) tablet 40 mg  40 mg Oral Daily Mansy, Jan A, MD       gabapentin (NEURONTIN) capsule 600 mg  600 mg Oral BID Mansy, Jan A, MD   600 mg at 11/06/23 1235   guaiFENesin (MUCINEX) 12 hr tablet 600 mg  600 mg Oral BID PRN Mansy, Jan A, MD       insulin aspart (novoLOG) injection 0-15 Units  0-15 Units Subcutaneous TID WC Mansy, Jan A, MD   3 Units at 11/06/23 1319   insulin aspart (novoLOG) injection 0-5 Units  0-5 Units Subcutaneous QHS Mansy, Jan A, MD       ipratropium-albuterol (DUONEB) 0.5-2.5 (3) MG/3ML nebulizer  solution 3 mL  3 mL Nebulization Q4H PRN Mansy, Jan A, MD       magnesium hydroxide (MILK OF MAGNESIA) suspension 30 mL  30 mL Oral Daily PRN Mansy, Jan A, MD       melatonin tablet 10 mg  10 mg Oral QHS PRN Mansy, Jan A, MD       morphine (PF) 2 MG/ML injection 2 mg  2 mg Intravenous Q4H PRN Mansy, Jan A, MD   2 mg at 11/06/23 1235   ondansetron (ZOFRAN) tablet 4 mg  4 mg Oral Q6H PRN Mansy, Jan A, MD       Or   ondansetron [facility]) injection 4 mg  4 mg Intravenous Q6H PRN Mansy, Jan A, MD   4 mg at 11/06/23 0731   rOPINIRole (REQUIP) tablet 2 mg  2 mg Oral BID Mansy, Jan A, MD   2 mg at 11/06/23 0941   traMADol (ULTRAM) tablet 50 mg  50 mg Oral Q6H PRN Mansy, Jan A, MD       traZODone (DESYREL) tablet 25 mg  25 mg Oral QHS PRN Mansy, Jan A, MD        VITAL SIGNS: BP 124/71 (BP Location: Left Arm)   Pulse 76   Temp 97.8 F (36.6 C)   Resp 18   Ht 5\' 2"  (1.575 m)   Wt 274 lb 14.6 oz (124.7 kg)   LMP 01/04/2018   SpO2 95%   BMI 50.28 kg/m  Filed Weights   11/05/23 1538  Weight: 274 lb 14.6 oz (124.7 kg)    Estimated body mass index is 50.28 kg/m as calculated from the following:   Height as of this encounter: 5\' 2"  (1.575 m).   Weight as of this encounter: 274 lb 14.6 oz (124.7 kg).  LABS: CBC:    Component Value Date/Time   WBC 8.8 11/06/2023 0449   HGB 13.1 11/06/2023 0449   HGB 12.0 04/10/2014 1001   HCT 40.5 11/06/2023 0449   HCT 38.2 04/10/2014 1001   PLT 280 11/06/2023 0449   PLT 421 04/10/2014 1001   MCV 86.7 11/06/2023 0449   MCV 83 04/10/2014 1001   NEUTROABS 5.4 09/29/2023 0709   NEUTROABS 6.3 03/20/2014 0707   LYMPHSABS 1.6 09/29/2023 0709   LYMPHSABS 0.6 (L) 03/20/2014 0707   MONOABS 0.7 09/29/2023 0709   MONOABS 0.2 03/20/2014 0707   EOSABS 0.1 09/29/2023 0709   EOSABS 0.1 03/20/2014 0707   BASOSABS 0.0 09/29/2023 0709   BASOSABS 0.0 03/20/2014 0707   Comprehensive Metabolic Panel:    Component Value Date/Time   NA 138 11/06/2023 0449   NA 143  04/10/2014 1001   K 3.7  11/06/2023 0449   K 3.4 (L) 04/10/2014 1001   CL 100 11/06/2023 0449   CL 108 (H) 04/10/2014 1001   CO2 30 11/06/2023 0449   CO2 31 04/10/2014 1001   BUN 11 11/06/2023 0449   BUN 14 04/10/2014 1001   CREATININE 0.78 11/06/2023 0449   CREATININE 1.14 04/10/2014 1001   GLUCOSE 275 (H) 11/06/2023 0449   GLUCOSE 107 (H) 04/10/2014 1001   CALCIUM 9.1 11/06/2023 0449   CALCIUM 8.8 04/10/2014 1001   AST 13 (L) 11/06/2023 0449   AST 18 04/10/2014 1001   ALT 10 11/06/2023 0449   ALT 14 04/10/2014 1001   ALKPHOS 77 11/06/2023 0449   ALKPHOS 82 04/10/2014 1001   BILITOT 0.8 11/06/2023 0449   BILITOT 0.3 04/10/2014 1001   PROT 7.2 11/06/2023 0449   PROT 7.5 04/10/2014 1001   ALBUMIN 3.6 11/06/2023 0449   ALBUMIN 3.1 (L) 04/10/2014 1001    RADIOGRAPHIC STUDIES: CT ABDOMEN PELVIS W CONTRAST Result Date: 11/05/2023 CLINICAL DATA:  Acute nonlocalized abdominal pain. Patient reports pain for 2 months, progressive. EXAM: CT ABDOMEN AND PELVIS WITH CONTRAST TECHNIQUE: Multidetector CT imaging of the abdomen and pelvis was performed using the standard protocol following bolus administration of intravenous contrast. RADIATION DOSE REDUCTION: This exam was performed according to the departmental dose-optimization program which includes automated exposure control, adjustment of the mA and/or kV according to patient size and/or use of iterative reconstruction technique. CONTRAST:  OMNIPAQUE IOHEXOL 350 MG/ML SOLN COMPARISON:  CT 08/21/2023, MRI 08/24/2023 FINDINGS: Lower chest: No basilar airspace disease or pleural effusion. Hepatobiliary: Multiple hypodense hepatic lesions, increased in number and size from prior imaging. Motion artifact limits detailed assessment. Largest lesion is in the inferior right hepatic lobe measuring 2.6 cm, series 2, image 35, not previously demonstrated on CT. Subcapsular lesion in the right lobe measures 19 mm, series 2, image 17, not previously  demonstrated on CT. Lesion in the left lobe measures 16 mm, series 2, image 18, not previously demonstrated on CT. Cholecystectomy without biliary dilatation. Pancreas: Hypodense lesion in the pancreatic tail measures 7.2 x 3.9 cm, previously 5 5 x 3.7 cm. No adjacent inflammation. There is no proximal pancreatic ductal dilatation, motion limited. Spleen: Hypodense lesion in the anterior spleen is new from prior imaging, 13 mm series 2, image 7. Adrenals/Urinary Tract: No adrenal nodule. No hydronephrosis or renal calculi. No suspicious renal lesion. Unremarkable urinary bladder. Stomach/Bowel: Laxity of the anterior abdominal wall with midline ventral abdominal wall hernia containing small and to a lesser extent large bowel. There is no bowel obstruction or inflammation. Moderate volume of stool in the colon. Scattered colonic diverticula without diverticulitis. The stomach is nondistended. Vascular/Lymphatic: Minor aortic atherosclerosis. No aortic aneurysm. No definite enlarged lymph nodes on this motion limited exam. Reproductive: Status post hysterectomy. No adnexal masses. Other: No ascites. Small fat containing periumbilical hernia. No definite omental thickening. Musculoskeletal: Advanced degenerative change throughout the spine. Advanced bilateral hip arthropathy. Advanced bilateral sacroiliac arthropathy. No evidence of focal bone lesion or acute osseous findings. IMPRESSION: 1. Multiple hypodense hepatic lesions, increased in number and size from prior imaging, highly suspicious for metastatic disease. 2. Hypodense lesion in the pancreatic tail measures 7.2 x 3.9 cm, increased in size from prior. The increase in size favors pancreatic neoplasm, with additional etiologies less likely. 3. New hypodense lesion in the anterior spleen, 13 mm, suspicious for metastatic disease. 4. Laxity of the anterior abdominal wall with midline ventral abdominal wall hernia containing small and to  a lesser extent large  bowel. No bowel obstruction or inflammation. 5. Colonic diverticulosis without diverticulitis. Aortic Atherosclerosis (ICD10-I70.0). Electronically Signed   By: Narda Rutherford M.D.   On: 11/05/2023 23:00    PERFORMANCE STATUS (ECOG) : 2 - Symptomatic, <50% confined to bed  Review of Systems Unless otherwise noted, a complete review of systems is negative.  Physical Exam General: NAD Cardiovascular: regular rate and rhythm Pulmonary: clear ant fields Abdomen: soft, nontender, + bowel sounds GU: no suprapubic tenderness Extremities: no edema, no joint deformities Skin: no rashes Neurological: Weakness but otherwise nonfocal  IMPRESSION: Patient hospitalized in January and February 2025.  Found to have a pancreatic mass with recommendation for outpatient follow-up.  However, patient [facility] care in the [facility].  She is now admitted to the hospital with intractable abdominal pain.  Unfortunately, CT shows enlarging pancreatic mass and new probable metastatic disease to the liver and spleen.  Patient met with medical oncology earlier today and is interested in pursuing biopsy for tissue diagnosis.  Patient tells me that she would be interested in pursuing treatment if any options are available.  However, patient states that she recognizes that workup appears suspicious for metastatic prostate cancer.  Patient says she recognizes that this diagnosis would likely be life limiting.  She says that she is ready whenever "Jesus calls me home."  Patient says she met with her daughter this morning and together they decided on DNR/DNI.  Patient would want her daughter to be involved in medical decision-making if needed.  Patient does not have advanced directives.  At baseline, patient lives at home alone.  She does have caregivers 20 hours weekly through [facility] program.  She is in process of trying to increase the number hours of home support.  Symptomatically, she endorses pain that  localizes to her upper abdomen.  She describes this as sharp, intense, and persistent.  Unchanged with oral intake or movement.  Patient has been receiving as needed morphine with some improvement in symptoms.  PLAN: -Patient interested in biopsy and discussing treatment options -Start oxycodone 5 mg every 4 hours as needed and liberalize dosing as needed/tolerated -Could consider starting on a long-acting opioid if needed -Could also consider discussing with IR for feasibility of celiac plexus block in the outpatient setting -Daily bowel regimen -Will plan outpatient follow-up  Case and plan discussed with Dr. Cathie Hoops and Dr. Georgeann Oppenheim  Patient expressed understanding and was in agreement with this plan. She also understands that She can call the clinic at any time with any questions, concerns, or complaints.     Time Total: 30 minutes  Visit consisted of counseling and education dealing with the complex and emotionally intense issues of symptom management and palliative care in the setting of serious and potentially life-threatening illness.Greater than 50%  of this time was spent counseling and coordinating care related to the above assessment and plan.  Signed by: Laurette Schimke, PhD, NP-C

## 2023-11-06 NOTE — Plan of Care (Signed)
  Problem: Education: Goal: Ability to describe self-care measures that may prevent or decrease complications (Diabetes Survival Skills Education) will improve Outcome: Progressing   Problem: Coping: Goal: Ability to adjust to condition or change in health will improve Outcome: Progressing   Problem: Fluid Volume: Goal: Ability to maintain a balanced intake and output will improve Outcome: Progressing   Problem: Metabolic: Goal: Ability to maintain appropriate glucose levels will improve Outcome: Progressing   Problem: Nutritional: Goal: Maintenance of adequate nutrition will improve Outcome: Progressing   Problem: Skin Integrity: Goal: Risk for impaired skin integrity will decrease Outcome: Progressing   Problem: Tissue Perfusion: Goal: Adequacy of tissue perfusion will improve Outcome: Progressing   Problem: Education: Goal: Knowledge of General Education information will improve Description: Including pain rating scale, medication(s)/side effects and non-pharmacologic comfort measures Outcome: Progressing   Problem: Activity: Goal: Risk for activity intolerance will decrease Outcome: Progressing   Problem: Nutrition: Goal: Adequate nutrition will be maintained Outcome: Progressing   Problem: Elimination: Goal: Will not experience complications related to bowel motility Outcome: Progressing   Problem: Pain Managment: Goal: General experience of comfort will improve and/or be controlled Outcome: Progressing   Problem: Safety: Goal: Ability to remain free from injury will improve Outcome: Progressing

## 2023-11-06 NOTE — Assessment & Plan Note (Signed)
-   We will continue her inhalers. 

## 2023-11-06 NOTE — Assessment & Plan Note (Signed)
-   The patient was admitted to a medical telemetry observation bed. - Pain management will be provided. - Oncology consult will be obtained. - I notified Dr.Yu about the patient.

## 2023-11-06 NOTE — Progress Notes (Signed)
 PROGRESS NOTE    Kristina Cruz  ZOX:[id number] DOB: Nov 19, 1960 DOA: 11/05/2023 PCP: Dudley Major, FNP    Brief Narrative:  63 y.o. female with medical history significant for asthma, type 2 diabetes mellitus, GERD, hypertension and restless leg syndrome as well as pancreatic mass diagnosed in January, presented to the emergency room with acute onset of generalized abdominal pain which has been worsening over the last couple of months.  It has been difficult to eat lately.  She has been having significant diminished appetite.  She admits nausea and vomiting without bilious vomitus or hematemesis.  She lost about 10 pounds over the last few weeks.  No fever or chills.  No dysuria, oliguria or hematuria or flank pain.  No cough or wheezing or hemoptysis.  Not chest pain or palpitations.    Assessment & Plan:   Principal Problem:   Pancreatic cancer metastasized to liver [facility]) Active Problems:   Dyslipidemia   Essential hypertension   Type 2 diabetes mellitus with peripheral neuropathy (HCC)   Chronic obstructive pulmonary disease (COPD) (HCC)   Intractable pain  Pancreatic cancer metastasized to liver (HCC) Intractable cancer related pain Intractable nausea vomiting Patient's has a likely diagnosis based on imaging.  She was initially noted to have a pancreatic mass in January 2025.  She was lost to follow-up subsequently and presented back to the emergency room on 3/20 complaining of intractable abdominal pain associated with nausea vomiting and poor p.o. intake.  Imaging findings very concerning for metastatic pancreatic carcinoma.  Oncology and palliative care involved. Plan: Interventional radiology consult placed for image guided biopsy of liver lesion.  CA 19-9 and CEA to be ordered by oncology.  Will monitor over the weekend.  Ensure pain and nausea control.  Dyslipidemia PTA statin   Type 2 diabetes mellitus with peripheral neuropathy (HCC) Hold oral agents.  Sliding scale  coverage.  Carb modified diet.   Essential hypertension Blood pressure controlled.  Continue current regimen   Chronic obstructive pulmonary disease (COPD) (HCC) No evidence of exacerbation.  PTA bronchodilators  Class III obesity BMI 50.28.  Complicating factor in overall care and prognosis.   DVT prophylaxis: Lovenox Code Status: DNR Family Communication: None today Disposition Plan: Status is: Inpatient Remains inpatient appropriate because: Intractable pain   Level of care: Med-Surg  Consultants:  Oncology Palliative care  Procedures:  None  Antimicrobials: None   Subjective: Seen and examined.  Sitting up in bed.  Reports mild to moderate abdominal pain.  Objective: Vitals:   11/06/23 1235 11/06/23 1300 11/06/23 1414 11/06/23 1441  BP: (!) 142/74 (!) 140/76  124/71  Pulse:  68  76  Resp:  18  18  Temp:   98.1 F (36.7 C) 97.8 F (36.6 C)  TempSrc:   Oral   SpO2:  91%  95%  Weight:      Height:       No intake or output data in the 24 hours ending 11/06/23 1517 Filed Weights   11/05/23 1538  Weight: 124.7 kg    Examination:  General exam: Appears calm and comfortable  Respiratory system: Clear to auscultation. Respiratory effort normal. Cardiovascular system: S1-2, RRR, no murmurs, no pedal edema Gastrointestinal system: Obese, mild TTP, positive bowel sounds Central nervous system: Alert and oriented. No focal neurological deficits. Extremities: Symmetric 5 x 5 power. Skin: No rashes, lesions or ulcers Psychiatry: Judgement and insight appear normal. Mood & affect appropriate.     Data Reviewed: I have personally reviewed following labs  and imaging studies  CBC: Recent Labs  Lab 11/05/23 1541 11/06/23 0449  WBC 8.3 8.8  HGB 13.4 13.1  HCT 41.6 40.5  MCV 86.8 86.7  PLT 277 280   Basic Metabolic Panel: Recent Labs  Lab 11/05/23 1541 11/06/23 0449  NA 135 138  K 3.9 3.7  CL 99 100  CO2 29 30  GLUCOSE 227* 275*  BUN 10 11   CREATININE 0.70 0.78  CALCIUM 9.0 9.1   GFR: Estimated Creatinine Clearance: 88.4 mL/min (by C-G formula based on SCr of 0.78 mg/dL). Liver Function Tests: Recent Labs  Lab 11/05/23 1541 11/06/23 0449  AST 12* 13*  ALT 12 10  ALKPHOS 80 77  BILITOT 0.9 0.8  PROT 7.8 7.2  ALBUMIN 3.8 3.6   Recent Labs  Lab 11/05/23 1541  LIPASE 24   No results for input(s): "AMMONIA" in the last 168 hours. Coagulation Profile: No results for input(s): "INR", "PROTIME" in the last 168 hours. Cardiac Enzymes: No results for input(s): "CKTOTAL", "CKMB", "CKMBINDEX", "TROPONINI" in the last 168 hours. BNP (last 3 results) No results for input(s): "PROBNP" in the last 8760 hours. HbA1C: No results for input(s): "HGBA1C" in the last 72 hours. CBG: Recent Labs  Lab 11/06/23 0258 11/06/23 0728 11/06/23 1211  GLUCAP 263* 187* 194*   Lipid Profile: No results for input(s): "CHOL", "HDL", "LDLCALC", "TRIG", "CHOLHDL", "LDLDIRECT" in the last 72 hours. Thyroid Function Tests: No results for input(s): "TSH", "T4TOTAL", "FREET4", "T3FREE", "THYROIDAB" in the last 72 hours. Anemia Panel: No results for input(s): "VITAMINB12", "FOLATE", "FERRITIN", "TIBC", "IRON", "RETICCTPCT" in the last 72 hours. Sepsis Labs: No results for input(s): "PROCALCITON", "LATICACIDVEN" in the last 168 hours.  No results found for this or any previous visit (from the past 240 hours).       Radiology Studies: CT ABDOMEN PELVIS W CONTRAST Result Date: 11/05/2023 CLINICAL DATA:  Acute nonlocalized abdominal pain. Patient reports pain for 2 months, progressive. EXAM: CT ABDOMEN AND PELVIS WITH CONTRAST TECHNIQUE: Multidetector CT imaging of the abdomen and pelvis was performed using the standard protocol following bolus administration of intravenous contrast. RADIATION DOSE REDUCTION: This exam was performed according to the departmental dose-optimization program which includes automated exposure control, adjustment  of the mA and/or kV according to patient size and/or use of iterative reconstruction technique. CONTRAST:  OMNIPAQUE IOHEXOL 350 MG/ML SOLN COMPARISON:  CT 08/21/2023, MRI 08/24/2023 FINDINGS: Lower chest: No basilar airspace disease or pleural effusion. Hepatobiliary: Multiple hypodense hepatic lesions, increased in number and size from prior imaging. Motion artifact limits detailed assessment. Largest lesion is in the inferior right hepatic lobe measuring 2.6 cm, series 2, image 35, not previously demonstrated on CT. Subcapsular lesion in the right lobe measures 19 mm, series 2, image 17, not previously demonstrated on CT. Lesion in the left lobe measures 16 mm, series 2, image 18, not previously demonstrated on CT. Cholecystectomy without biliary dilatation. Pancreas: Hypodense lesion in the pancreatic tail measures 7.2 x 3.9 cm, previously 5 5 x 3.7 cm. No adjacent inflammation. There is no proximal pancreatic ductal dilatation, motion limited. Spleen: Hypodense lesion in the anterior spleen is new from prior imaging, 13 mm series 2, image 7. Adrenals/Urinary Tract: No adrenal nodule. No hydronephrosis or renal calculi. No suspicious renal lesion. Unremarkable urinary bladder. Stomach/Bowel: Laxity of the anterior abdominal wall with midline ventral abdominal wall hernia containing small and to a lesser extent large bowel. There is no bowel obstruction or inflammation. Moderate volume of stool in the colon.  Scattered colonic diverticula without diverticulitis. The stomach is nondistended. Vascular/Lymphatic: Minor aortic atherosclerosis. No aortic aneurysm. No definite enlarged lymph nodes on this motion limited exam. Reproductive: Status post hysterectomy. No adnexal masses. Other: No ascites. Small fat containing periumbilical hernia. No definite omental thickening. Musculoskeletal: Advanced degenerative change throughout the spine. Advanced bilateral hip arthropathy. Advanced bilateral sacroiliac  arthropathy. No evidence of focal bone lesion or acute osseous findings. IMPRESSION: 1. Multiple hypodense hepatic lesions, increased in number and size from prior imaging, highly suspicious for metastatic disease. 2. Hypodense lesion in the pancreatic tail measures 7.2 x 3.9 cm, increased in size from prior. The increase in size favors pancreatic neoplasm, with additional etiologies less likely. 3. New hypodense lesion in the anterior spleen, 13 mm, suspicious for metastatic disease. 4. Laxity of the anterior abdominal wall with midline ventral abdominal wall hernia containing small and to a lesser extent large bowel. No bowel obstruction or inflammation. 5. Colonic diverticulosis without diverticulitis. Aortic Atherosclerosis (ICD10-I70.0). Electronically Signed   By: Narda Rutherford M.D.   On: 11/05/2023 23:00        Scheduled Meds:  amLODipine  10 mg Oral Daily   atorvastatin  20 mg Oral Daily   enoxaparin (LOVENOX) injection  60 mg Subcutaneous Q24H   fluticasone  1 spray Each Nare Daily   fluticasone furoate-vilanterol  1 puff Inhalation Daily   furosemide  40 mg Oral Daily   gabapentin  600 mg Oral BID   insulin aspart  0-15 Units Subcutaneous TID WC   insulin aspart  0-5 Units Subcutaneous QHS   rOPINIRole  2 mg Oral BID   Continuous Infusions:  sodium chloride 100 mL/hr at 11/06/23 1004     LOS: 0 days    Tresa Moore, MD [facility]   If 7PM-7AM, please contact night-coverage  11/06/2023, 3:17 PM

## 2023-11-06 NOTE — ED Notes (Signed)
 Pt refused lasix dose and advised that it makes her not feel well and she "already don't feel well as it is".

## 2023-11-06 NOTE — ED Notes (Signed)
 Pt assisted walking to the restroom with a walker without incident.

## 2023-11-06 NOTE — Assessment & Plan Note (Signed)
-   The patient will be placed on supplemental coverage with NovoLog. - Will hold off metformin. - We will continue Neurontin.

## 2023-11-06 NOTE — ED Notes (Signed)
 Pt placed on RA to assess O2 sats at this time, currently 95% on RA

## 2023-11-06 NOTE — ED Notes (Signed)
Pt given breakfast meal

## 2023-11-06 NOTE — ED Notes (Signed)
 Pt sats to 81% on RA, pt NAD reports minimal SHOB, placed on 3L [place]

## 2023-11-07 DIAGNOSIS — D7389 Other diseases of spleen: Secondary | ICD-10-CM | POA: Diagnosis not present

## 2023-11-07 DIAGNOSIS — R160 Hepatomegaly, not elsewhere classified: Secondary | ICD-10-CM | POA: Diagnosis not present

## 2023-11-07 DIAGNOSIS — R1084 Generalized abdominal pain: Secondary | ICD-10-CM | POA: Diagnosis not present

## 2023-11-07 DIAGNOSIS — C787 Secondary malignant neoplasm of liver and intrahepatic bile duct: Secondary | ICD-10-CM | POA: Diagnosis not present

## 2023-11-07 DIAGNOSIS — K8689 Other specified diseases of pancreas: Secondary | ICD-10-CM | POA: Diagnosis not present

## 2023-11-07 DIAGNOSIS — C259 Malignant neoplasm of pancreas, unspecified: Secondary | ICD-10-CM | POA: Diagnosis not present

## 2023-11-07 LAB — GLUCOSE, CAPILLARY
Glucose-Capillary: 241 mg/dL — ABNORMAL HIGH (ref 70–99)
Glucose-Capillary: 203 mg/dL — ABNORMAL HIGH (ref 70–99)
Glucose-Capillary: 261 mg/dL — ABNORMAL HIGH (ref 70–99)
Glucose-Capillary: 260 mg/dL — ABNORMAL HIGH (ref 70–99)

## 2023-11-07 MED ORDER — OXYCODONE HCL ER 10 MG PO T12A
10.0000 mg | EXTENDED_RELEASE_TABLET | Freq: Two times a day (BID) | ORAL | Status: DC
  Administered 2023-11-07 – 2023-11-10 (×7): 10 mg via ORAL
  Filled 2023-11-07 (×7): qty 1

## 2023-11-07 MED ORDER — LIDOCAINE 5 % EX PTCH
1.0000 | MEDICATED_PATCH | CUTANEOUS | Status: DC
  Administered 2023-11-07 – 2023-11-09 (×3): 1 via TRANSDERMAL
  Filled 2023-11-07 (×4): qty 1

## 2023-11-07 NOTE — Progress Notes (Signed)
 Mobility Specialist - Progress Note   11/07/23 1114  Mobility  Activity Ambulated with assistance in hallway  Level of Assistance Standby assist, set-up cues, supervision of patient - no hands on  Assistive Device Front wheel walker  Distance Ambulated (ft) 200 ft  Activity Response Tolerated well  Mobility Referral Yes  Mobility visit 1 Mobility  Mobility Specialist Start Time (ACUTE ONLY) 1006  Mobility Specialist Stop Time (ACUTE ONLY) 1021  Mobility Specialist Time Calculation (min) (ACUTE ONLY) 15 min   Pt sitting in recliner eager to ambulate. Pt STS and ambulates in hallway SBA with no LOB noted. Pt does have a anterior lean throughout ambulation, unable to straighten up due to previous back injury. Pt returns to chair with needs in reach.   Kristina Cruz  Mobility Specialist  11/07/23 11:16 AM

## 2023-11-07 NOTE — Progress Notes (Signed)
 PROGRESS NOTE    Kristina Cruz  MVH:[id number] DOB: 10-08-1960 DOA: 11/05/2023 PCP: Dudley Major, FNP    Brief Narrative:  63 y.o. female with medical history significant for asthma, type 2 diabetes mellitus, GERD, hypertension and restless leg syndrome as well as pancreatic mass diagnosed in January, presented to the emergency room with acute onset of generalized abdominal pain which has been worsening over the last couple of months.  It has been difficult to eat lately.  She has been having significant diminished appetite.  She admits nausea and vomiting without bilious vomitus or hematemesis.  She lost about 10 pounds over the last few weeks.  No fever or chills.  No dysuria, oliguria or hematuria or flank pain.  No cough or wheezing or hemoptysis.  Not chest pain or palpitations.    Assessment & Plan:   Principal Problem:   Pancreatic cancer metastasized to liver [facility]) Active Problems:   Dyslipidemia   Essential hypertension   Type 2 diabetes mellitus with peripheral neuropathy (HCC)   Chronic obstructive pulmonary disease (COPD) (HCC)   Intractable pain   Palliative care encounter   Epigastric pain   Liver mass  Pancreatic cancer metastasized to liver [facility]) Intractable cancer related pain Intractable nausea vomiting Patient's has a likely diagnosis based on imaging.  She was initially noted to have a pancreatic mass in January 2025.  She was lost to follow-up subsequently and presented back to the emergency room on 3/20 complaining of intractable abdominal pain associated with nausea vomiting and poor p.o. intake.  Imaging findings very concerning for metastatic pancreatic carcinoma.  Oncology and palliative care involved. Plan: Interventional radiology to evaluate Monday 3/24 for image guided liver biopsy.  CA 19-9 and CEA ordered and pending.  Add low-dose OxyContin 10 mg every 12 hours.  Attempted wean off IV narcotics.  Ensure nausea control  Dyslipidemia PTA statin    Type 2 diabetes mellitus with peripheral neuropathy (HCC) Oral agents on hold.  Sliding scale coverage.  Carb modified diet   Essential hypertension Blood pressure controlled.  Continue current regimen   Chronic obstructive pulmonary disease (COPD) (HCC) No evidence of exacerbation.  PTA bronchodilators  Class III obesity BMI 50.28.  Complicating factor in overall care and prognosis.   DVT prophylaxis: Lovenox Code Status: DNR Family Communication: None today Disposition Plan: Status is: Inpatient Remains inpatient appropriate because: Intractable pain   Level of care: Med-Surg  Consultants:  Oncology Palliative care  Procedures:  None  Antimicrobials: None   Subjective: Seen examined.  Sitting up in bed.  Reports no appreciable change in pain control over interval.  Objective: Vitals:   11/07/23 0000 11/07/23 0322 11/07/23 0800 11/07/23 1100  BP: 133/69 131/69 (!) 114/56 126/67  Pulse: 76 77 66 77  Resp: 18 18 18 18   Temp: 97.8 F (36.6 C) 97.6 F (36.4 C) 97.7 F (36.5 C) (!) 97.5 F (36.4 C)  TempSrc: Oral Oral Oral Axillary  SpO2: 98% 98% 96% 96%  Weight:      Height:        Intake/Output Summary (Last 24 hours) at 11/07/2023 1159 Last data filed at 11/07/2023 1033 Gross per 24 hour  Intake 120 ml  Output --  Net 120 ml   Filed Weights   11/05/23 1538  Weight: 124.7 kg    Examination:  General exam: Appears fatigued Respiratory system: Lungs clear.  Normal work of breathing.  Room air Cardiovascular system: S1-2, RRR, no murmurs, no pedal edema Gastrointestinal system: Obese, mild  TTP, positive bowel sounds Central nervous system: Alert and oriented. No focal neurological deficits. Extremities: Symmetric 5 x 5 power. Skin: No rashes, lesions or ulcers Psychiatry: Judgement and insight appear normal. Mood & affect appropriate.     Data Reviewed: I have personally reviewed following labs and imaging studies  CBC: Recent Labs  Lab  11/05/23 1541 11/06/23 0449  WBC 8.3 8.8  HGB 13.4 13.1  HCT 41.6 40.5  MCV 86.8 86.7  PLT 277 280   Basic Metabolic Panel: Recent Labs  Lab 11/05/23 1541 11/06/23 0449  NA 135 138  K 3.9 3.7  CL 99 100  CO2 29 30  GLUCOSE 227* 275*  BUN 10 11  CREATININE 0.70 0.78  CALCIUM 9.0 9.1   GFR: Estimated Creatinine Clearance: 88.4 mL/min (by C-G formula based on SCr of 0.78 mg/dL). Liver Function Tests: Recent Labs  Lab 11/05/23 1541 11/06/23 0449  AST 12* 13*  ALT 12 10  ALKPHOS 80 77  BILITOT 0.9 0.8  PROT 7.8 7.2  ALBUMIN 3.8 3.6   Recent Labs  Lab 11/05/23 1541  LIPASE 24   No results for input(s): "AMMONIA" in the last 168 hours. Coagulation Profile: No results for input(s): "INR", "PROTIME" in the last 168 hours. Cardiac Enzymes: No results for input(s): "CKTOTAL", "CKMB", "CKMBINDEX", "TROPONINI" in the last 168 hours. BNP (last 3 results) No results for input(s): "PROBNP" in the last 8760 hours. HbA1C: No results for input(s): "HGBA1C" in the last 72 hours. CBG: Recent Labs  Lab 11/06/23 1211 11/06/23 1709 11/06/23 2220 11/07/23 0809 11/07/23 1116  GLUCAP 194* 244* 202* 241* 203*   Lipid Profile: No results for input(s): "CHOL", "HDL", "LDLCALC", "TRIG", "CHOLHDL", "LDLDIRECT" in the last 72 hours. Thyroid Function Tests: No results for input(s): "TSH", "T4TOTAL", "FREET4", "T3FREE", "THYROIDAB" in the last 72 hours. Anemia Panel: No results for input(s): "VITAMINB12", "FOLATE", "FERRITIN", "TIBC", "IRON", "RETICCTPCT" in the last 72 hours. Sepsis Labs: No results for input(s): "PROCALCITON", "LATICACIDVEN" in the last 168 hours.  No results found for this or any previous visit (from the past 240 hours).       Radiology Studies: CT ABDOMEN PELVIS W CONTRAST Result Date: 11/05/2023 CLINICAL DATA:  Acute nonlocalized abdominal pain. Patient reports pain for 2 months, progressive. EXAM: CT ABDOMEN AND PELVIS WITH CONTRAST TECHNIQUE:  Multidetector CT imaging of the abdomen and pelvis was performed using the standard protocol following bolus administration of intravenous contrast. RADIATION DOSE REDUCTION: This exam was performed according to the departmental dose-optimization program which includes automated exposure control, adjustment of the mA and/or kV according to patient size and/or use of iterative reconstruction technique. CONTRAST:  OMNIPAQUE IOHEXOL 350 MG/ML SOLN COMPARISON:  CT 08/21/2023, MRI 08/24/2023 FINDINGS: Lower chest: No basilar airspace disease or pleural effusion. Hepatobiliary: Multiple hypodense hepatic lesions, increased in number and size from prior imaging. Motion artifact limits detailed assessment. Largest lesion is in the inferior right hepatic lobe measuring 2.6 cm, series 2, image 35, not previously demonstrated on CT. Subcapsular lesion in the right lobe measures 19 mm, series 2, image 17, not previously demonstrated on CT. Lesion in the left lobe measures 16 mm, series 2, image 18, not previously demonstrated on CT. Cholecystectomy without biliary dilatation. Pancreas: Hypodense lesion in the pancreatic tail measures 7.2 x 3.9 cm, previously 5 5 x 3.7 cm. No adjacent inflammation. There is no proximal pancreatic ductal dilatation, motion limited. Spleen: Hypodense lesion in the anterior spleen is new from prior imaging, 13 mm series 2, image  7. Adrenals/Urinary Tract: No adrenal nodule. No hydronephrosis or renal calculi. No suspicious renal lesion. Unremarkable urinary bladder. Stomach/Bowel: Laxity of the anterior abdominal wall with midline ventral abdominal wall hernia containing small and to a lesser extent large bowel. There is no bowel obstruction or inflammation. Moderate volume of stool in the colon. Scattered colonic diverticula without diverticulitis. The stomach is nondistended. Vascular/Lymphatic: Minor aortic atherosclerosis. No aortic aneurysm. No definite enlarged lymph nodes on this  motion limited exam. Reproductive: Status post hysterectomy. No adnexal masses. Other: No ascites. Small fat containing periumbilical hernia. No definite omental thickening. Musculoskeletal: Advanced degenerative change throughout the spine. Advanced bilateral hip arthropathy. Advanced bilateral sacroiliac arthropathy. No evidence of focal bone lesion or acute osseous findings. IMPRESSION: 1. Multiple hypodense hepatic lesions, increased in number and size from prior imaging, highly suspicious for metastatic disease. 2. Hypodense lesion in the pancreatic tail measures 7.2 x 3.9 cm, increased in size from prior. The increase in size favors pancreatic neoplasm, with additional etiologies less likely. 3. New hypodense lesion in the anterior spleen, 13 mm, suspicious for metastatic disease. 4. Laxity of the anterior abdominal wall with midline ventral abdominal wall hernia containing small and to a lesser extent large bowel. No bowel obstruction or inflammation. 5. Colonic diverticulosis without diverticulitis. Aortic Atherosclerosis (ICD10-I70.0). Electronically Signed   By: Narda Rutherford M.D.   On: 11/05/2023 23:00        Scheduled Meds:  amLODipine  10 mg Oral Daily   atorvastatin  20 mg Oral Daily   enoxaparin (LOVENOX) injection  60 mg Subcutaneous Q24H   fluticasone  1 spray Each Nare Daily   fluticasone furoate-vilanterol  1 puff Inhalation Daily   furosemide  40 mg Oral Daily   gabapentin  600 mg Oral BID   insulin aspart  0-15 Units Subcutaneous TID WC   insulin aspart  0-5 Units Subcutaneous QHS   oxyCODONE  10 mg Oral Q12H   rOPINIRole  2 mg Oral BID   Continuous Infusions:     LOS: 1 day    Tresa Moore, MD [facility]   If 7PM-7AM, please contact night-coverage  11/07/2023, 11:59 AM

## 2023-11-07 NOTE — Progress Notes (Signed)
 Hematology/Oncology Progress note [phone] [phone]     Patient Care Team: Dudley Major, FNP as PCP - General (Nurse Practitioner)   Name of the patient: Kristina Cruz  [id number]  1961/06/08  Date of visit: 11/07/23   INTERVAL HISTORY-   No acute overnight events. Patient reports that she did not sleep well. Pain improves after taking pain regimen, will 3 out of 10.  But does not last long. Due to tolerated oral intake this morning.   Allergies  Allergen Reactions   Peanut-Containing Drug Products     Patient Active Problem List   Diagnosis Date Noted   Essential hypertension 11/06/2023   Dyslipidemia 11/06/2023   Type 2 diabetes mellitus with peripheral neuropathy (HCC) 11/06/2023   Chronic obstructive pulmonary disease (COPD) (HCC) 11/06/2023   Intractable pain 11/06/2023   Palliative care encounter 11/06/2023   Epigastric pain 11/06/2023   Liver mass 11/06/2023   Pancreatic cancer metastasized to liver (HCC) 11/05/2023   Acute asthma exacerbation 09/28/2023   Acute hypoxic respiratory failure (HCC) 08/21/2023   Pneumonia due to COVID-19 virus 08/21/2023   Pancreatic mass 08/21/2023   Spinal stenosis of lumbar region 08/21/2023   Degeneration of lumbar intervertebral disc 08/21/2023   Mastoid effusion, left 06/30/2023   Sinusitis 06/28/2023   Type 2 diabetes mellitus (HCC) 06/28/2023   Asthma exacerbation 06/28/2023   COPD with acute bronchitis (HCC) 04/18/2023   COPD with acute exacerbation (HCC) 04/18/2023   Respiratory failure with hypoxia (HCC) 02/10/2023   Uncontrolled type 2 diabetes mellitus with hyperglycemia, without long-term current use of insulin (HCC) 02/08/2018   Abnormal ECG 12/31/2017   Chest pain with low risk for cardiac etiology 12/31/2017   Preop cardiovascular exam 12/31/2017   SOB (shortness of breath) on exertion 12/31/2017   Restless leg syndrome 12/16/2017   Hypertension 12/16/2017   Endometrial  hyperplasia without atypia, simple 12/03/2017   Endometrial polyp 11/11/2017   Post-menopausal bleeding 11/11/2017   Endometrial thickening on ultrasound 04/27/2017   Obesity, Class III, BMI 40-49.9 (morbid obesity) (HCC) 04/27/2017     Past Medical History:  Diagnosis Date   Anemia    Asthma    Back pain    Cataract    Diabetes mellitus without complication (HCC)    GERD (gastroesophageal reflux disease)    Hypertension    Morbid obesity with BMI of 60.0-69.9, adult (HCC)    Restless leg syndrome      Past Surgical History:  Procedure Laterality Date   ABDOMINAL HYSTERECTOMY     CATARACT EXTRACTION W/PHACO Left 04/13/2020   Procedure: CATARACT EXTRACTION PHACO AND INTRAOCULAR LENS PLACEMENT (IOC);  Surgeon: Elliot Cousin, MD;  Location: [facility] ORS;  Service: Ophthalmology;  Laterality: Left;  [place] 00:36.0 CDE 5.95 Fluid Pack lot # [id number]   HYSTEROSCOPY WITH D & C N/A 07/14/2017   Procedure: DILATATION AND CURETTAGE /HYSTEROSCOPY;  Surgeon: Nadara Mustard, MD;  Location: [facility];  Service: Gynecology;  Laterality: N/A;   POLYPECTOMY  2015    Social History   Socioeconomic History   Marital status: Single    Spouse name: Not on file   Number of children: Not on file   Years of education: Not on file   Highest education level: Not on file  Occupational History   Not on file  Tobacco Use   Smoking status: Never   Smokeless tobacco: Never  Vaping Use   Vaping status: Never Used  Substance and Sexual Activity   Alcohol use: No  Drug use: No   Sexual activity: Never    Birth control/protection: None  Other Topics Concern   Not on file  Social History Narrative   Not on file   Social Drivers of Health   Financial Resource Strain: Low Risk  (09/26/2022)   Received from [facility], [facility]   Overall Financial Resource Strain (CARDIA)    Difficulty of Paying Living Expenses: Not hard at all  Food Insecurity: No Food  Insecurity (11/06/2023)   Hunger Vital Sign    Worried About Running Out of Food in the Last Year: Never true    Ran Out of Food in the Last Year: Never true  Transportation Needs: No Transportation Needs (11/06/2023)   PRAPARE - Administrator, Civil Service (Medical): No    Lack of Transportation (Non-Medical): No  Recent Concern: Transportation Needs - Unmet Transportation Needs (09/28/2023)   PRAPARE - Administrator, Civil Service (Medical): Yes    Lack of Transportation (Non-Medical): Yes  Physical Activity: Not on file  Stress: Not on file  Social Connections: Socially Isolated (11/06/2023)   Social Connection and Isolation Panel [NHANES]    Frequency of Communication with Friends and Family: More than three times a week    Frequency of Social Gatherings with Friends and Family: More than three times a week    Attends Religious Services: Never    Database administrator or Organizations: No    Attends Banker Meetings: Never    Marital Status: Never married  Intimate Partner Violence: Not At Risk (11/06/2023)   Humiliation, Afraid, Rape, and Kick questionnaire    Fear of Current or Ex-Partner: No    Emotionally Abused: No    Physically Abused: No    Sexually Abused: No     Family History  Problem Relation Age of Onset   Breast cancer Mother 77   Diabetes Mother    Hypertension Mother    Ovarian cancer Paternal Aunt        ?   Diabetes Father    Hypertension Father      Current Facility-Administered Medications:    acetaminophen (TYLENOL) tablet 650 mg, 650 mg, Oral, Q6H PRN **OR** acetaminophen (TYLENOL) suppository 650 mg, 650 mg, Rectal, Q6H PRN, Mansy, Jan A, MD   amLODipine (NORVASC) tablet 10 mg, 10 mg, Oral, Daily, Mansy, Jan A, MD, 10 mg at 11/06/23 1235   atorvastatin (LIPITOR) tablet 20 mg, 20 mg, Oral, Daily, Mansy, Jan A, MD, 20 mg at 11/07/23 1014   enoxaparin (LOVENOX) injection 60 mg, 60 mg, Subcutaneous, Q24H, Mansy, Jan  A, MD, 60 mg at 11/07/23 1016   EPINEPHrine (EPI-PEN) injection 0.3 mg, 0.3 mg, Intramuscular, PRN, Mansy, Jan A, MD   fluticasone (FLONASE) 50 MCG/ACT nasal spray 1 spray, 1 spray, Each Nare, Daily, Mansy, Jan A, MD, 1 spray at 11/07/23 1015   fluticasone furoate-vilanterol (BREO ELLIPTA) 100-25 MCG/ACT 1 puff, 1 puff, Inhalation, Daily, Mansy, Jan A, MD, 1 puff at 11/07/23 1015   furosemide (LASIX) tablet 40 mg, 40 mg, Oral, Daily, Mansy, Jan A, MD   gabapentin (NEURONTIN) capsule 600 mg, 600 mg, Oral, BID, Mansy, Jan A, MD, 600 mg at 11/07/23 1014   guaiFENesin (MUCINEX) 12 hr tablet 600 mg, 600 mg, Oral, BID PRN, Mansy, Jan A, MD   insulin aspart (novoLOG) injection 0-15 Units, 0-15 Units, Subcutaneous, TID WC, Mansy, Vernetta Honey, MD, 5 Units at 11/07/23 1144   insulin  aspart (novoLOG) injection 0-5 Units, 0-5 Units, Subcutaneous, QHS, Mansy, Jan A, MD, 2 Units at 11/06/23 2224   ipratropium-albuterol (DUONEB) 0.5-2.5 (3) MG/3ML nebulizer solution 3 mL, 3 mL, Nebulization, Q4H PRN, Mansy, Jan A, MD   magnesium hydroxide (MILK OF MAGNESIA) suspension 30 mL, 30 mL, Oral, Daily PRN, Mansy, Jan A, MD   melatonin tablet 10 mg, 10 mg, Oral, QHS PRN, Mansy, Jan A, MD   morphine (PF) 2 MG/ML injection 2 mg, 2 mg, Intravenous, Q4H PRN, Mansy, Jan A, MD, 2 mg at 11/07/23 0142   ondansetron (ZOFRAN) tablet 4 mg, 4 mg, Oral, Q6H PRN **OR** ondansetron (ZOFRAN) injection 4 mg, 4 mg, Intravenous, Q6H PRN, Mansy, Jan A, MD, 4 mg at 11/06/23 2224   oxyCODONE (Oxy IR/ROXICODONE) immediate release tablet 5 mg, 5 mg, Oral, Q4H PRN, Borders, Daryl Eastern, NP, 5 mg at 11/07/23 0541   oxyCODONE (OXYCONTIN) 12 hr tablet 10 mg, 10 mg, Oral, Q12H, Sreenath, Sudheer B, MD, 10 mg at 11/07/23 1144   rOPINIRole (REQUIP) tablet 2 mg, 2 mg, Oral, BID, Mansy, Jan A, MD, 2 mg at 11/07/23 1014   traZODone (DESYREL) tablet 25 mg, 25 mg, Oral, QHS PRN, Mansy, Jan A, MD, 25 mg at 11/06/23 2116   Physical exam:  Vitals:   11/07/23 0000  11/07/23 0322 11/07/23 0800 11/07/23 1100  BP: 133/69 131/69 (!) 114/56 126/67  Pulse: 76 77 66 77  Resp: 18 18 18 18   Temp: 97.8 F (36.6 C) 97.6 F (36.4 C) 97.7 F (36.5 C) (!) 97.5 F (36.4 C)  TempSrc: Oral Oral Oral Axillary  SpO2: 98% 98% 96% 96%  Weight:      Height:       Physical Exam Constitutional:      General: She is not in acute distress.    Appearance: She is not diaphoretic.     Comments: Patient sits in the chair  HENT:     Head: Normocephalic and atraumatic.  Eyes:     General: No scleral icterus.    Pupils: Pupils are equal, round, and reactive to light.  Cardiovascular:     Rate and Rhythm: Normal rate and regular rhythm.  Pulmonary:     Effort: Pulmonary effort is normal. No respiratory distress.     Comments: Diminished breath sound bilaterally Abdominal:     General: There is no distension.     Palpations: Abdomen is soft.  Musculoskeletal:        General: Normal range of motion.     Cervical back: Normal range of motion and neck supple.  Skin:    General: Skin is warm and dry.     Findings: No erythema.  Neurological:     Mental Status: She is alert and oriented to person, place, and time. Mental status is at baseline.     Cranial Nerves: No cranial nerve deficit.     Motor: No abnormal muscle tone.  Psychiatric:        Mood and Affect: Mood and affect normal.       Labs    Latest Ref Rng & Units 11/06/2023    4:49 AM 11/05/2023    3:41 PM 09/29/2023    7:09 AM  CBC  WBC 4.0 - 10.5 K/uL 8.8  8.3  7.9   Hemoglobin 12.0 - 15.0 g/dL 84.6  96.2  95.2   Hematocrit 36.0 - 46.0 % 40.5  41.6  36.8   Platelets 150 - 400 K/uL 280  277  288  Latest Ref Rng & Units 11/06/2023    4:49 AM 11/05/2023    3:41 PM 09/29/2023    7:09 AM  CMP  Glucose 70 - 99 mg/dL 161  096  045   BUN 8 - 23 mg/dL 11  10  14    Creatinine 0.44 - 1.00 mg/dL 4.09  8.11  9.14   Sodium 135 - 145 mmol/L 138  135  136   Potassium 3.5 - 5.1 mmol/L 3.7  3.9  3.6    Chloride 98 - 111 mmol/L 100  99  100   CO2 22 - 32 mmol/L 30  29  27    Calcium 8.9 - 10.3 mg/dL 9.1  9.0  8.9   Total Protein 6.5 - 8.1 g/dL 7.2  7.8    Total Bilirubin 0.0 - 1.2 mg/dL 0.8  0.9    Alkaline Phos 38 - 126 U/L 77  80    AST 15 - 41 U/L 13  12    ALT 0 - 44 U/L 10  12       RADIOGRAPHIC STUDIES: I have personally reviewed the radiological images as listed and agreed with the findings in the report. CT ABDOMEN PELVIS W CONTRAST Result Date: 11/05/2023 CLINICAL DATA:  Acute nonlocalized abdominal pain. Patient reports pain for 2 months, progressive. EXAM: CT ABDOMEN AND PELVIS WITH CONTRAST TECHNIQUE: Multidetector CT imaging of the abdomen and pelvis was performed using the standard protocol following bolus administration of intravenous contrast. RADIATION DOSE REDUCTION: This exam was performed according to the departmental dose-optimization program which includes automated exposure control, adjustment of the mA and/or kV according to patient size and/or use of iterative reconstruction technique. CONTRAST:  OMNIPAQUE IOHEXOL 350 MG/ML SOLN COMPARISON:  CT 08/21/2023, MRI 08/24/2023 FINDINGS: Lower chest: No basilar airspace disease or pleural effusion. Hepatobiliary: Multiple hypodense hepatic lesions, increased in number and size from prior imaging. Motion artifact limits detailed assessment. Largest lesion is in the inferior right hepatic lobe measuring 2.6 cm, series 2, image 35, not previously demonstrated on CT. Subcapsular lesion in the right lobe measures 19 mm, series 2, image 17, not previously demonstrated on CT. Lesion in the left lobe measures 16 mm, series 2, image 18, not previously demonstrated on CT. Cholecystectomy without biliary dilatation. Pancreas: Hypodense lesion in the pancreatic tail measures 7.2 x 3.9 cm, previously 5 5 x 3.7 cm. No adjacent inflammation. There is no proximal pancreatic ductal dilatation, motion limited. Spleen: Hypodense lesion in the  anterior spleen is new from prior imaging, 13 mm series 2, image 7. Adrenals/Urinary Tract: No adrenal nodule. No hydronephrosis or renal calculi. No suspicious renal lesion. Unremarkable urinary bladder. Stomach/Bowel: Laxity of the anterior abdominal wall with midline ventral abdominal wall hernia containing small and to a lesser extent large bowel. There is no bowel obstruction or inflammation. Moderate volume of stool in the colon. Scattered colonic diverticula without diverticulitis. The stomach is nondistended. Vascular/Lymphatic: Minor aortic atherosclerosis. No aortic aneurysm. No definite enlarged lymph nodes on this motion limited exam. Reproductive: Status post hysterectomy. No adnexal masses. Other: No ascites. Small fat containing periumbilical hernia. No definite omental thickening. Musculoskeletal: Advanced degenerative change throughout the spine. Advanced bilateral hip arthropathy. Advanced bilateral sacroiliac arthropathy. No evidence of focal bone lesion or acute osseous findings. IMPRESSION: 1. Multiple hypodense hepatic lesions, increased in number and size from prior imaging, highly suspicious for metastatic disease. 2. Hypodense lesion in the pancreatic tail measures 7.2 x 3.9 cm, increased in size from prior. The increase in  size favors pancreatic neoplasm, with additional etiologies less likely. 3. New hypodense lesion in the anterior spleen, 13 mm, suspicious for metastatic disease. 4. Laxity of the anterior abdominal wall with midline ventral abdominal wall hernia containing small and to a lesser extent large bowel. No bowel obstruction or inflammation. 5. Colonic diverticulosis without diverticulitis. Aortic Atherosclerosis (ICD10-I70.0). Electronically Signed   By: Narda Rutherford M.D.   On: 11/05/2023 23:00    Assessment and plan-   # Enlarging pancreatic tail mass with multiple liver lesions, lesion increased in number and size comparing to prior imaging.  New spleen lesion.   Clinically suspicious for malignancy, especially metastatic pancreatic cancer.   Check CA 19-9, CEA, results are pending.  IR has been consulted for liver lesion biopsy early next week.  Future treatments plan depending on final pathological results.   # Abdominal pain.  continue Oxycodone 5mg -10mg  Q4-6h PRN with bowel regimen. I agree with adding long acting. OxyContin 10mg  Q12h.   Outpatient follow up for pain assessment, regimen titration and decision of celiac block.    Thank you for allowing me to participate in the care of this patient.   Rickard Patience, MD, PhD Hematology Oncology 11/07/2023

## 2023-11-08 DIAGNOSIS — C787 Secondary malignant neoplasm of liver and intrahepatic bile duct: Secondary | ICD-10-CM | POA: Diagnosis not present

## 2023-11-08 DIAGNOSIS — C259 Malignant neoplasm of pancreas, unspecified: Secondary | ICD-10-CM | POA: Diagnosis not present

## 2023-11-08 LAB — GLUCOSE, CAPILLARY
Glucose-Capillary: 270 mg/dL — ABNORMAL HIGH (ref 70–99)
Glucose-Capillary: 253 mg/dL — ABNORMAL HIGH (ref 70–99)
Glucose-Capillary: 181 mg/dL — ABNORMAL HIGH (ref 70–99)
Glucose-Capillary: 317 mg/dL — ABNORMAL HIGH (ref 70–99)

## 2023-11-08 LAB — CEA: CEA: 59.8 ng/mL — ABNORMAL HIGH (ref 0.0–4.7)

## 2023-11-08 LAB — CANCER ANTIGEN 19-9: CA 19-9: 2 U/mL (ref 0–35)

## 2023-11-08 NOTE — Progress Notes (Signed)
 Mobility Specialist - Progress Note   11/08/23 0921  Mobility  Activity Ambulated with assistance in hallway  Level of Assistance Standby assist, set-up cues, supervision of patient - no hands on  Assistive Device Front wheel walker  Distance Ambulated (ft) 50 ft  Activity Response Tolerated well  Mobility Referral Yes  Mobility visit 1 Mobility  Mobility Specialist Start Time (ACUTE ONLY) 0834  Mobility Specialist Stop Time (ACUTE ONLY) 0851  Mobility Specialist Time Calculation (min) (ACUTE ONLY) 17 min   Pt sitting in recliner on RA upon arrival. Pt STS and ambulates in hallway SBA, pt has a heavy anterior lean during ambulation. Pt returns to recliner with needs in reach.   Terrilyn Saver  Mobility Specialist  11/08/23 9:22 AM

## 2023-11-08 NOTE — Evaluation (Signed)
 Physical Therapy Evaluation Patient Details Name: Kristina Cruz MRN: [id number] DOB: 12-25-1960 Today's Date: 11/08/2023  History of Present Illness  presented to ER secondary to abdominal pain; admitted for management of intractable back pain related to pancreatic cancer with mets to liver.  Clinical Impression  Patient standing in room, preparing for shower with RN upon arrival to room; therapist assumed patient and assisted with remaining shower routine (see details below). Patient alert and oriented, follows commands and agreeable to participation with treatment session; eager to participate/progress, intent on maintaining indep as much as possible.  Denies pain; feels medications have been very helpful in controlling pain.  Bilat UE/LE strength and ROM grossly symmetrical and WFL; no focal weakness appreciated.  Does demonstrate/maintain significant bilat hip flexion in all standing postures/activities (related to chronic back pain/issues per patient). Able to complete sit/stand, basic transfers and gait (200') with RW, close sup.  Demonstrates very forward flexed posture at bilat hips (approx 70-80 degrees flexed), resting forearms on RW throughout gait distance; broad BOS with choppy, flat-footed steps. 1-2 brief standing rest breaks; no buckling, LOB or overt safety concern.  Patient very confident and comfortable with mobility; may consider having daughter bring personal walker for use in room as able. Would benefit from skilled PT to address above deficits and promote optimal return to PLOF.; recommend post-acute PT follow up as indicated by interdisciplinary care team.            If plan is discharge home, recommend the following: A little help with walking and/or transfers;A little help with bathing/dressing/bathroom   Can travel by private vehicle        Equipment Recommendations    Recommendations for Other Services       Functional Status Assessment       Precautions  / Restrictions Precautions Precautions: Fall Restrictions Weight Bearing Restrictions Per Provider Order: No      Mobility  Bed Mobility               General bed mobility comments: seated in recliner beginning/end of treatment session    Transfers Overall transfer level: Needs assistance Equipment used: Rolling walker (2 wheels) Transfers: Sit to/from Stand Sit to Stand: Supervision                Ambulation/Gait Ambulation/Gait assistance: Supervision Gait Distance (Feet): 200 Feet Assistive device: Rolling walker (2 wheels)         General Gait Details: very forward flexed posture at bilat hips (approx 70-80 degrees flexed), resting forearms on RW throughout gait distance; broad BOS with choppy, flat-footed steps.  1-2 brief standing rest breaks; no buckling, LOB or overt safety concern.  Stairs            Wheelchair Mobility     Tilt Bed    Modified Rankin (Stroke Patients Only)       Balance Overall balance assessment: Needs assistance Sitting-balance support: No upper extremity supported, Feet supported Sitting balance-Leahy Scale: Good     Standing balance support: Bilateral upper extremity supported Standing balance-Leahy Scale: Fair                               Pertinent Vitals/Pain Pain Assessment Pain Assessment: No/denies pain    Home Living Family/patient expects to be discharged to:: Private residence Living Arrangements: Alone Available Help at Discharge: Family;Personal care attendant;Available PRN/intermittently Type of Home: Apartment Home Access: Stairs to enter   [employer] of  Steps: 1 step to enter apt building (no railing) and then 3-4 steps with B railing (unable to reach both at same time) to enter home   Home Layout: One level   Additional Comments: eva walker (?)    Prior Function Prior Level of Function : Needs assist             Mobility Comments: Pt reports being  modified independent ambulating with RW (limited household distances d/t SOB/weakness); requires 2 assist typically with stairs; no home O2 use.  Was actively participating with [facility] therapies prior to admission. ADLs Comments: Pt reports having home health aid 3 hours/day (7 days a week) to assist with IADLs and dressing     Extremity/Trunk Assessment   Upper Extremity Assessment Upper Extremity Assessment: Overall WFL for tasks assessed    Lower Extremity Assessment Lower Extremity Assessment: Overall WFL for tasks assessed (grossly at least 4/5 throughout)    Cervical / Trunk Assessment Cervical / Trunk Assessment:  (maintains bilat hip flexion approx 70-80 degrees in all upright positions)  Communication        Cognition Arousal: Alert Behavior During Therapy: WFL for tasks assessed/performed   PT - Cognitive impairments: No apparent impairments                                 Cueing       General Comments      Exercises Other Exercises Other Exercises: Patient preparing for shower with RN upon arrival to room; therapist assumed patient to complete during session.  Cga/min assist for standing balance when stepping in/out of shower; otherwise, completes shower in standing with distant sup/mod indep.  Total assist to wash back, thread pants/undergarments over LEs and don socks; distant sup/mod indep for standing balance to pull pants over hips, don shirt and apply deoderant.   Assessment/Plan    PT Assessment Patient needs continued PT services  PT Problem List Decreased activity tolerance;Decreased balance;Decreased mobility;Decreased knowledge of use of DME;Decreased safety awareness;Decreased knowledge of precautions;Pain       PT Treatment Interventions DME instruction;Gait training;Stair training;Functional mobility training;Therapeutic activities;Therapeutic exercise;Balance training;Patient/family education    PT Goals (Current goals can be found in  the Care Plan section)  Acute Rehab PT Goals Patient Stated Goal: to keep the ability to do as much for myself as i can PT Goal Formulation: With patient Time For Goal Achievement: 11/22/23 Potential to Achieve Goals: Good    Frequency Min 1X/week     Co-evaluation               AM-PAC PT "6 Clicks" Mobility  Outcome Measure Help needed turning from your back to your side while in a flat bed without using bedrails?: A Little Help needed moving from lying on your back to sitting on the side of a flat bed without using bedrails?: A Little Help needed moving to and from a bed to a chair (including a wheelchair)?: None Help needed standing up from a chair using your arms (e.g., wheelchair or bedside chair)?: None Help needed to walk in hospital room?: None Help needed climbing 3-5 steps with a railing? : A Little 6 Click Score: 21    End of Session   Activity Tolerance: Patient tolerated treatment well Patient left: in chair;with call bell/phone within reach Nurse Communication: Mobility status PT Visit Diagnosis: Muscle weakness (generalized) (M62.81);Difficulty in walking, not elsewhere classified (R26.2)    Time: 1610-9604  PT Time Calculation (min) (ACUTE ONLY): 41 min   Charges:   PT Evaluation $PT Eval Moderate Complexity: 1 Mod PT Treatments $Therapeutic Activity: 23-37 mins PT General Charges $$ ACUTE PT VISIT: 1 Visit        Safa Derner H. Manson Passey, PT, DPT, NCS 11/08/23, 4:38 PM [phone]

## 2023-11-08 NOTE — Progress Notes (Signed)
 PROGRESS NOTE    Kristina Cruz  ZOX:[id number] DOB: 29-Mar-1961 DOA: 11/05/2023 PCP: Dudley Major, FNP    Brief Narrative:  63 y.o. female with medical history significant for asthma, type 2 diabetes mellitus, GERD, hypertension and restless leg syndrome as well as pancreatic mass diagnosed in January, presented to the emergency room with acute onset of generalized abdominal pain which has been worsening over the last couple of months.  It has been difficult to eat lately.  She has been having significant diminished appetite.  She admits nausea and vomiting without bilious vomitus or hematemesis.  She lost about 10 pounds over the last few weeks.  No fever or chills.  No dysuria, oliguria or hematuria or flank pain.  No cough or wheezing or hemoptysis.  Not chest pain or palpitations.    Assessment & Plan:   Principal Problem:   Pancreatic cancer metastasized to liver [facility]) Active Problems:   Dyslipidemia   Essential hypertension   Type 2 diabetes mellitus with peripheral neuropathy (HCC)   Chronic obstructive pulmonary disease (COPD) (HCC)   Intractable pain   Palliative care encounter   Epigastric pain   Liver mass  Pancreatic cancer metastasized to liver [facility]) Intractable cancer related pain Intractable nausea vomiting Patient's has a likely diagnosis based on imaging.  She was initially noted to have a pancreatic mass in January 2025.  She was lost to follow-up subsequently and presented back to the emergency room on 3/20 complaining of intractable abdominal pain associated with nausea vomiting and poor p.o. intake.  Imaging findings very concerning for metastatic pancreatic carcinoma.  Oncology and palliative care involved. Plan: Interventional radiology to evaluate Monday 3/24 for image guided liver biopsy.  CA 19-9 and CEA ordered and pending.  Continue low-dose OxyContin 10 mg every 12 hours.  Attempt to wean from IV narcotics.  Ensure nausea control.  N.p.o. after  midnight.  Dyslipidemia PTA statin   Type 2 diabetes mellitus with peripheral neuropathy (HCC) Oral agents on hold.  Sliding scale coverage.  Diet as tolerated.   Essential hypertension Blood pressure controlled.  Continue current regimen   Chronic obstructive pulmonary disease (COPD) (HCC) No evidence of exacerbation.  PTA bronchodilators  Class III obesity BMI 50.28.  Complicating factor in overall care and prognosis.   DVT prophylaxis: Lovenox Code Status: DNR Family Communication: None today Disposition Plan: Status is: Inpatient Remains inpatient appropriate because: Intractable pain   Level of care: Med-Surg  Consultants:  Oncology Palliative care  Procedures:  None  Antimicrobials: None   Subjective: Seen and examined.  Sitting up in chair.  No visible distress.  Endorses improved pain control.  Request to have a shower.  Objective: Vitals:   11/07/23 1100 11/07/23 1705 11/07/23 1959 11/08/23 0812  BP: 126/67 (!) 143/61 128/72 121/69  Pulse: 77 70 70 77  Resp: 18 18 18 20   Temp: (!) 97.5 F (36.4 C) 97.8 F (36.6 C) 98 F (36.7 C) 98.4 F (36.9 C)  TempSrc: Axillary   Oral  SpO2: 96% 98% 99% 93%  Weight:      Height:        Intake/Output Summary (Last 24 hours) at 11/08/2023 1240 Last data filed at 11/08/2023 1012 Gross per 24 hour  Intake 560 ml  Output --  Net 560 ml   Filed Weights   11/05/23 1538  Weight: 124.7 kg    Examination:  General exam: NAD.  Appears fatigued Respiratory system: Lungs clear.  Normal work of breathing.  Room air  Cardiovascular system: S1-2, RRR, no murmurs, no pedal edema Gastrointestinal system: Obese, mild TTP right upper quadrant, positive bowel sounds Central nervous system: Alert and oriented. No focal neurological deficits. Extremities: Symmetric 5 x 5 power. Skin: No rashes, lesions or ulcers Psychiatry: Judgement and insight appear normal. Mood & affect appropriate.     Data Reviewed: I have  personally reviewed following labs and imaging studies  CBC: Recent Labs  Lab 11/05/23 1541 11/06/23 0449  WBC 8.3 8.8  HGB 13.4 13.1  HCT 41.6 40.5  MCV 86.8 86.7  PLT 277 280   Basic Metabolic Panel: Recent Labs  Lab 11/05/23 1541 11/06/23 0449  NA 135 138  K 3.9 3.7  CL 99 100  CO2 29 30  GLUCOSE 227* 275*  BUN 10 11  CREATININE 0.70 0.78  CALCIUM 9.0 9.1   GFR: Estimated Creatinine Clearance: 88.4 mL/min (by C-G formula based on SCr of 0.78 mg/dL). Liver Function Tests: Recent Labs  Lab 11/05/23 1541 11/06/23 0449  AST 12* 13*  ALT 12 10  ALKPHOS 80 77  BILITOT 0.9 0.8  PROT 7.8 7.2  ALBUMIN 3.8 3.6   Recent Labs  Lab 11/05/23 1541  LIPASE 24   No results for input(s): "AMMONIA" in the last 168 hours. Coagulation Profile: No results for input(s): "INR", "PROTIME" in the last 168 hours. Cardiac Enzymes: No results for input(s): "CKTOTAL", "CKMB", "CKMBINDEX", "TROPONINI" in the last 168 hours. BNP (last 3 results) No results for input(s): "PROBNP" in the last 8760 hours. HbA1C: No results for input(s): "HGBA1C" in the last 72 hours. CBG: Recent Labs  Lab 11/07/23 1116 11/07/23 1700 11/07/23 2115 11/08/23 0821 11/08/23 1116  GLUCAP 203* 261* 260* 270* 253*   Lipid Profile: No results for input(s): "CHOL", "HDL", "LDLCALC", "TRIG", "CHOLHDL", "LDLDIRECT" in the last 72 hours. Thyroid Function Tests: No results for input(s): "TSH", "T4TOTAL", "FREET4", "T3FREE", "THYROIDAB" in the last 72 hours. Anemia Panel: No results for input(s): "VITAMINB12", "FOLATE", "FERRITIN", "TIBC", "IRON", "RETICCTPCT" in the last 72 hours. Sepsis Labs: No results for input(s): "PROCALCITON", "LATICACIDVEN" in the last 168 hours.  No results found for this or any previous visit (from the past 240 hours).       Radiology Studies: No results found.       Scheduled Meds:  amLODipine  10 mg Oral Daily   atorvastatin  20 mg Oral Daily   enoxaparin  (LOVENOX) injection  60 mg Subcutaneous Q24H   fluticasone  1 spray Each Nare Daily   fluticasone furoate-vilanterol  1 puff Inhalation Daily   furosemide  40 mg Oral Daily   gabapentin  600 mg Oral BID   insulin aspart  0-15 Units Subcutaneous TID WC   insulin aspart  0-5 Units Subcutaneous QHS   lidocaine  1 patch Transdermal Q24H   oxyCODONE  10 mg Oral Q12H   rOPINIRole  2 mg Oral BID   Continuous Infusions:     LOS: 2 days    Tresa Moore, MD [facility]   If 7PM-7AM, please contact night-coverage  11/08/2023, 12:40 PM

## 2023-11-09 ENCOUNTER — Inpatient Hospital Stay

## 2023-11-09 DIAGNOSIS — C259 Malignant neoplasm of pancreas, unspecified: Secondary | ICD-10-CM | POA: Diagnosis not present

## 2023-11-09 DIAGNOSIS — C787 Secondary malignant neoplasm of liver and intrahepatic bile duct: Secondary | ICD-10-CM | POA: Diagnosis not present

## 2023-11-09 LAB — CBC
HCT: 38.8 % (ref 36.0–46.0)
Hemoglobin: 12.5 g/dL (ref 12.0–15.0)
MCH: 27.6 pg (ref 26.0–34.0)
MCHC: 32.2 g/dL (ref 30.0–36.0)
MCV: 85.7 fL (ref 80.0–100.0)
Platelets: 248 10*3/uL (ref 150–400)
RBC: 4.53 MIL/uL (ref 3.87–5.11)
RDW: 14.9 % (ref 11.5–15.5)
WBC: 8.1 10*3/uL (ref 4.0–10.5)
nRBC: 0 % (ref 0.0–0.2)

## 2023-11-09 LAB — PROTIME-INR
INR: 1.1 (ref 0.8–1.2)
Prothrombin Time: 14.4 s (ref 11.4–15.2)

## 2023-11-09 LAB — GLUCOSE, CAPILLARY
Glucose-Capillary: 238 mg/dL — ABNORMAL HIGH (ref 70–99)
Glucose-Capillary: 240 mg/dL — ABNORMAL HIGH (ref 70–99)
Glucose-Capillary: 218 mg/dL — ABNORMAL HIGH (ref 70–99)
Glucose-Capillary: 336 mg/dL — ABNORMAL HIGH (ref 70–99)

## 2023-11-09 MED ORDER — MIDAZOLAM HCL 2 MG/2ML IJ SOLN
INTRAMUSCULAR | Status: AC
  Filled 2023-11-09: qty 4

## 2023-11-09 MED ORDER — LIDOCAINE HCL (PF) 1 % IJ SOLN
10.0000 mL | Freq: Once | INTRAMUSCULAR | Status: AC
  Administered 2023-11-09: 10 mL via INTRADERMAL

## 2023-11-09 MED ORDER — FENTANYL CITRATE (PF) 100 MCG/2ML IJ SOLN
INTRAMUSCULAR | Status: AC | PRN
  Administered 2023-11-09: 50 ug via INTRAVENOUS

## 2023-11-09 MED ORDER — MIDAZOLAM HCL 2 MG/2ML IJ SOLN
INTRAMUSCULAR | Status: AC | PRN
  Administered 2023-11-09: 1 mg via INTRAVENOUS

## 2023-11-09 MED ORDER — FENTANYL CITRATE (PF) 100 MCG/2ML IJ SOLN
INTRAMUSCULAR | Status: AC
  Filled 2023-11-09: qty 2

## 2023-11-09 NOTE — Progress Notes (Signed)
 PROGRESS NOTE    Kristina Cruz  ZOX:[id number] DOB: 1961/03/10 DOA: 11/05/2023 PCP: Dudley Major, FNP    Brief Narrative:  63 y.o. female with medical history significant for asthma, type 2 diabetes mellitus, GERD, hypertension and restless leg syndrome as well as pancreatic mass diagnosed in January, presented to the emergency room with acute onset of generalized abdominal pain which has been worsening over the last couple of months.  It has been difficult to eat lately.  She has been having significant diminished appetite.  She admits nausea and vomiting without bilious vomitus or hematemesis.  She lost about 10 pounds over the last few weeks.  No fever or chills.  No dysuria, oliguria or hematuria or flank pain.  No cough or wheezing or hemoptysis.  Not chest pain or palpitations.    Assessment & Plan:   Principal Problem:   Pancreatic cancer metastasized to liver [facility]) Active Problems:   Dyslipidemia   Essential hypertension   Type 2 diabetes mellitus with peripheral neuropathy (HCC)   Chronic obstructive pulmonary disease (COPD) (HCC)   Intractable pain   Palliative care encounter   Epigastric pain   Liver mass  Pancreatic cancer metastasized to liver [facility]) Intractable cancer related pain Intractable nausea vomiting Patient's has a likely diagnosis based on imaging.  She was initially noted to have a pancreatic mass in January 2025.  She was lost to follow-up subsequently and presented back to the emergency room on 3/20 complaining of intractable abdominal pain associated with nausea vomiting and poor p.o. intake.  Imaging findings very concerning for metastatic pancreatic carcinoma.  Oncology and palliative care involved. Plan: IR consulted S/p liver biopsy, suspect pancreas mets per IR Will optimize pain and nausea regimen today  Plan for dc tomorrow AM if stable  Dyslipidemia PTA statin   Type 2 diabetes mellitus with peripheral neuropathy (HCC) Oral agents on  hold.  Sliding scale coverage.  Diet as tolerated.   Essential hypertension Blood pressure controlled.  Continue current regimen   Chronic obstructive pulmonary disease (COPD) (HCC) No evidence of exacerbation.  PTA bronchodilators  Class III obesity BMI 50.28.  Complicating factor in overall care and prognosis.   DVT prophylaxis: Lovenox Code Status: DNR Family Communication: None today Disposition Plan: Status is: Inpatient Remains inpatient appropriate because: Intractable pain   Level of care: Med-Surg  Consultants:  Oncology Palliative care  Procedures:  None  Antimicrobials: None   Subjective: Seen and examined.  Still endorsing pain but improved from prior.  Objective: Vitals:   11/09/23 1429 11/09/23 1445 11/09/23 1500 11/09/23 1515  BP: 127/79 124/88 130/72 (!) 114/96  Pulse: 72 77 75 83  Resp: 14 15 15 20   Temp:      TempSrc:      SpO2: 96% 93% 92% 92%  Weight:      Height:       No intake or output data in the 24 hours ending 11/09/23 1615  Filed Weights   11/05/23 1538  Weight: 124.7 kg    Examination:  General exam: NAD.  Appears fatigued. Respiratory system: Lungs clear.  Normal work of breathing.  Room air Cardiovascular system: S1-2, RRR, no murmurs, no pedal edema Gastrointestinal system: Obese, mild TTP right upper quadrant, positive bowel sounds Central nervous system: Alert and oriented. No focal neurological deficits. Extremities: Symmetric 5 x 5 power. Skin: No rashes, lesions or ulcers Psychiatry: Judgement and insight appear normal. Mood & affect flattened.     Data Reviewed: I have personally reviewed  following labs and imaging studies  CBC: Recent Labs  Lab 11/05/23 1541 11/06/23 0449 11/09/23 0503  WBC 8.3 8.8 8.1  HGB 13.4 13.1 12.5  HCT 41.6 40.5 38.8  MCV 86.8 86.7 85.7  PLT 277 280 248   Basic Metabolic Panel: Recent Labs  Lab 11/05/23 1541 11/06/23 0449  NA 135 138  K 3.9 3.7  CL 99 100  CO2 29 30   GLUCOSE 227* 275*  BUN 10 11  CREATININE 0.70 0.78  CALCIUM 9.0 9.1   GFR: Estimated Creatinine Clearance: 88.4 mL/min (by C-G formula based on SCr of 0.78 mg/dL). Liver Function Tests: Recent Labs  Lab 11/05/23 1541 11/06/23 0449  AST 12* 13*  ALT 12 10  ALKPHOS 80 77  BILITOT 0.9 0.8  PROT 7.8 7.2  ALBUMIN 3.8 3.6   Recent Labs  Lab 11/05/23 1541  LIPASE 24   No results for input(s): "AMMONIA" in the last 168 hours. Coagulation Profile: Recent Labs  Lab 11/09/23 0503  INR 1.1   Cardiac Enzymes: No results for input(s): "CKTOTAL", "CKMB", "CKMBINDEX", "TROPONINI" in the last 168 hours. BNP (last 3 results) No results for input(s): "PROBNP" in the last 8760 hours. HbA1C: No results for input(s): "HGBA1C" in the last 72 hours. CBG: Recent Labs  Lab 11/08/23 1640 11/08/23 2102 11/09/23 0808 11/09/23 1115 11/09/23 1527  GLUCAP 181* 317* 238* 240* 218*   Lipid Profile: No results for input(s): "CHOL", "HDL", "LDLCALC", "TRIG", "CHOLHDL", "LDLDIRECT" in the last 72 hours. Thyroid Function Tests: No results for input(s): "TSH", "T4TOTAL", "FREET4", "T3FREE", "THYROIDAB" in the last 72 hours. Anemia Panel: No results for input(s): "VITAMINB12", "FOLATE", "FERRITIN", "TIBC", "IRON", "RETICCTPCT" in the last 72 hours. Sepsis Labs: No results for input(s): "PROCALCITON", "LATICACIDVEN" in the last 168 hours.  No results found for this or any previous visit (from the past 240 hours).       Radiology Studies: US BIOPSY (LIVER) Result Date: 11/09/2023 INDICATION: 63 year old female presents for biopsy of liver lesion EXAM: ULTRASOUND-GUIDED LIVER MASS BIOPSY MEDICATIONS: None. ANESTHESIA/SEDATION: Moderate (conscious) sedation was employed during this procedure. A total of Versed 1.0 mg and Fentanyl 50 mcg was administered intravenously by the radiology nurse. Total intra-service moderate Sedation Time: 10 minutes. The patient's level of consciousness and vital  signs were monitored continuously by radiology nursing throughout the procedure under my direct supervision. COMPLICATIONS: None PROCEDURE: Informed written consent was obtained from the patient after a thorough discussion of the procedural risks, benefits and alternatives. All questions were addressed. Maximal Sterile Barrier Technique was utilized including caps, mask, sterile gowns, sterile gloves, sterile drape, hand hygiene and skin antiseptic. A timeout was performed prior to the initiation of the procedure. Ultrasound survey of the right liver lobe performed with images stored and sent to PACs. The right lower thorax/right upper abdomen was prepped with chlorhexidine in a sterile fashion, and a sterile drape was applied covering the operative field. A sterile gown and sterile gloves were used for the procedure. Local anesthesia was provided with 1% Lidocaine. The patient was prepped and draped sterilely and the skin and subcutaneous tissues were generously infiltrated with 1% lidocaine. A 17 gauge introducer needle was then advanced under ultrasound guidance in sub costal location into the right liver lobe, targeting hypoechoic mass of the right liver. The stylet was removed, and multiple separate 18 gauge core biopsy were retrieved. Samples were placed into formalin for transportation to the lab. Gel-Foam slurry then infused with a small amount of saline for assistance with  hemostasis. The needle was removed, and a final ultrasound image was performed. The patient tolerated the procedure well and remained hemodynamically stable throughout. No complications were encountered and no significant blood loss was encounter. IMPRESSION: Status post ultrasound-guided liver mass biopsy Signed, Yvone Neu. Miachel Roux, RPVI [facility] Radiology Electronically Signed   By: Gilmer Mor D.O.   On: 11/09/2023 15:11         Scheduled Meds:  amLODipine  10 mg  Oral Daily   atorvastatin  20 mg Oral Daily   enoxaparin (LOVENOX) injection  60 mg Subcutaneous Q24H   fentaNYL       fluticasone  1 spray Each Nare Daily   fluticasone furoate-vilanterol  1 puff Inhalation Daily   furosemide  40 mg Oral Daily   gabapentin  600 mg Oral BID   insulin aspart  0-15 Units Subcutaneous TID WC   insulin aspart  0-5 Units Subcutaneous QHS   lidocaine  1 patch Transdermal Q24H   midazolam       oxyCODONE  10 mg Oral Q12H   rOPINIRole  2 mg Oral BID   Continuous Infusions:     LOS: 3 days    Tresa Moore, MD [facility]   If 7PM-7AM, please contact night-coverage  11/09/2023, 4:15 PM

## 2023-11-09 NOTE — Care Management Important Message (Signed)
 Important Message  Patient Details  Name: Kristina Cruz MRN: [id number] Date of Birth: 01-Apr-1961   Important Message Given:  Yes - Medicare IM     Cristela Blue, CMA 11/09/2023, 12:42 PM

## 2023-11-09 NOTE — Consult Note (Signed)
 Chief Complaint: Liver lesion; request for image guided liver lesion biopsy  Referring Provider(s): Dr. Georgeann Oppenheim  Supervising Physician: Gilmer Mor  Patient Status: [facility] - In-pt  History of Present Illness: Kristina Cruz is a 63 y.o. female with a PMH of asthma, COPD, DM2, GERD, HTN, restless leg syndrome, pancreatic mass (08/2023 dx). She presented to the ED on 3/20 for abd pain that has become progressively worse over last 2 months along with poor appetite, N/V. The abd/pelv CT ordered as part of ED workup revealed new hepatic lesions;  her care team has requested image guided liver lesion biopsy to address this finding.   Today, she is sitting up in her recliner at time of interview, eager to proceed with procedure. Denies fever/chills, N/V, abnormal bleeding. Endorses upper abd pain that "wraps around."  Allergic to peanuts.   Patient is DNR  Past Medical History:  Diagnosis Date   Anemia    Asthma    Back pain    Cataract    Diabetes mellitus without complication (HCC)    GERD (gastroesophageal reflux disease)    Hypertension    Morbid obesity with BMI of 60.0-69.9, adult (HCC)    Restless leg syndrome     Past Surgical History:  Procedure Laterality Date   ABDOMINAL HYSTERECTOMY     CATARACT EXTRACTION W/PHACO Left 04/13/2020   Procedure: CATARACT EXTRACTION PHACO AND INTRAOCULAR LENS PLACEMENT (IOC);  Surgeon: Elliot Cousin, MD;  Location: [facility] ORS;  Service: Ophthalmology;  Laterality: Left;  [place] 00:36.0 CDE 5.95 Fluid Pack lot # [id number]   HYSTEROSCOPY WITH D & C N/A 07/14/2017   Procedure: DILATATION AND CURETTAGE /HYSTEROSCOPY;  Surgeon: Nadara Mustard, MD;  Location: [facility] ORS;  Service: Gynecology;  Laterality: N/A;   POLYPECTOMY  2015    Allergies: Peanut-containing drug products  Medications: Prior to Admission medications   Medication Sig Start Date End Date Taking? Authorizing Provider  acetaminophen (TYLENOL 8 HOUR ARTHRITIS PAIN) 650 MG  CR tablet Take 3 tablets (1,950 mg total) by mouth every 8 (eight) hours as needed for pain. Do not exceed 4000 mg total in a day.  Home med. 04/21/23  Yes Darlin Priestly, MD  azelastine (ASTELIN) 0.1 % nasal spray Place 1 spray into both nostrils 2 (two) times daily. 10/22/23  Yes [provider]  EPINEPHrine 0.3 mg/0.3 mL IJ SOAJ injection Inject 0.3 mg into the muscle as needed for anaphylaxis. 09/19/20  Yes Shaune Pollack, MD  fluticasone (FLONASE) 50 MCG/ACT nasal spray Place 1 spray into both nostrils daily. 09/29/23  Yes Enedina Finner, MD  fluticasone furoate-vilanterol (BREO ELLIPTA) 100-25 MCG/ACT AEPB Inhale 1 puff into the lungs daily. 09/29/23  Yes Enedina Finner, MD  furosemide (LASIX) 40 MG tablet Take 40 mg by mouth every other day. 03/23/20  Yes [provider]  gabapentin (NEURONTIN) 300 MG capsule Take 600 mg by mouth 2 (two) times daily.   Yes [provider]  ipratropium-albuterol (DUONEB) 0.5-2.5 (3) MG/3ML SOLN Take 3 mLs by nebulization every 4 (four) hours as needed (wheezing / shortness of breath). 09/29/23  Yes Enedina Finner, MD  levocetirizine (XYZAL) 5 MG tablet Take 5 mg by mouth daily. 10/21/23  Yes [provider]  metFORMIN (GLUCOPHAGE) 1000 MG tablet Take 1,000 mg by mouth 2 (two) times daily. 11/27/22  Yes [provider]  rOPINIRole (REQUIP) 2 MG tablet Take 2 mg by mouth in the morning and at bedtime.    Yes [provider]  Semaglutide, 1 MG/DOSE,  4 MG/3ML SOPN Inject 1 mg into the skin once a week. 10/29/23  Yes [provider]  VENTOLIN HFA 108 (90 Base) MCG/ACT inhaler Inhale 2 puffs into the lungs every 4 (four) hours as needed for wheezing or shortness of breath. 09/29/23  Yes Enedina Finner, MD     Family History  Problem Relation Age of Onset   Breast cancer Mother 21   Diabetes Mother    Hypertension Mother    Ovarian cancer Paternal Aunt        ?   Diabetes Father    Hypertension Father     Social History    Socioeconomic History   Marital status: Single    Spouse name: Not on file   Number of children: Not on file   Years of education: Not on file   Highest education level: Not on file  Occupational History   Not on file  Tobacco Use   Smoking status: Never   Smokeless tobacco: Never  Vaping Use   Vaping status: Never Used  Substance and Sexual Activity   Alcohol use: No   Drug use: No   Sexual activity: Never    Birth control/protection: None  Other Topics Concern   Not on file  Social History Narrative   Not on file   Social Drivers of Health   Financial Resource Strain: Low Risk  (09/26/2022)   [facility], [facility]   Overall Financial Resource Strain (CARDIA)    Difficulty of Paying Living Expenses: Not hard at all  Food Insecurity: No Food Insecurity (11/06/2023)   Hunger Vital Sign    Worried About Running Out of Food in the Last Year: Never true    Ran Out of Food in the Last Year: Never true  Transportation Needs: No Transportation Needs (11/06/2023)   PRAPARE - Administrator, Civil Service (Medical): No    Lack of Transportation (Non-Medical): No  Recent Concern: Transportation Needs - Unmet Transportation Needs (09/28/2023)   PRAPARE - Administrator, Civil Service (Medical): Yes    Lack of Transportation (Non-Medical): Yes  Physical Activity: Not on file  Stress: Not on file  Social Connections: Socially Isolated (11/06/2023)   Social Connection and Isolation Panel [NHANES]    Frequency of Communication with Friends and Family: More than three times a week    Frequency of Social Gatherings with Friends and Family: More than three times a week    Attends Religious Services: Never    Database administrator or Organizations: No    Attends Banker Meetings: Never    Marital Status: Never married       Review of Systems  Constitutional:  Negative for chills and fever.   Respiratory:  Negative for shortness of breath.   Cardiovascular:  Negative for chest pain.  Gastrointestinal:  Positive for abdominal pain. Negative for nausea and vomiting.  Skin:  Negative for rash.  Hematological:  Does not bruise/bleed easily.     Vital Signs: BP 125/78 (BP Location: Left Wrist)   Pulse 72   Temp 97.9 F (36.6 C) (Oral)   Resp 16   Ht 5\' 2"  (1.575 m)   Wt 274 lb 14.6 oz (124.7 kg)   LMP 01/04/2018   SpO2 96%   BMI 50.28 kg/m   Advance Care Plan: DNR/DNI orders present in EHR; yellow DNR in physical chart   Physical Exam Cardiovascular:     Rate  and Rhythm: Normal rate and regular rhythm.     Heart sounds: Normal heart sounds.  Pulmonary:     Effort: Pulmonary effort is normal.     Breath sounds: Normal breath sounds.  Abdominal:     Palpations: Abdomen is soft.     Tenderness: There is abdominal tenderness in the right upper quadrant and epigastric area.  Skin:    General: Skin is warm and dry.     Comments: No rash or wounds to abdomen  Neurological:     Mental Status: She is alert and oriented to person, place, and time.  Psychiatric:        Mood and Affect: Mood normal.        Behavior: Behavior normal.      Imaging: CT ABDOMEN PELVIS W CONTRAST Result Date: 11/05/2023 CLINICAL DATA:  Acute nonlocalized abdominal pain. Patient reports pain for 2 months, progressive. EXAM: CT ABDOMEN AND PELVIS WITH CONTRAST TECHNIQUE: Multidetector CT imaging of the abdomen and pelvis was performed using the standard protocol following bolus administration of intravenous contrast. RADIATION DOSE REDUCTION: This exam was performed according to the departmental dose-optimization program which includes automated exposure control, adjustment of the mA and/or kV according to patient size and/or use of iterative reconstruction technique. CONTRAST:  OMNIPAQUE IOHEXOL 350 MG/ML SOLN COMPARISON:  CT 08/21/2023, MRI 08/24/2023 FINDINGS: Lower chest: No basilar  airspace disease or pleural effusion. Hepatobiliary: Multiple hypodense hepatic lesions, increased in number and size from prior imaging. Motion artifact limits detailed assessment. Largest lesion is in the inferior right hepatic lobe measuring 2.6 cm, series 2, image 35, not previously demonstrated on CT. Subcapsular lesion in the right lobe measures 19 mm, series 2, image 17, not previously demonstrated on CT. Lesion in the left lobe measures 16 mm, series 2, image 18, not previously demonstrated on CT. Cholecystectomy without biliary dilatation. Pancreas: Hypodense lesion in the pancreatic tail measures 7.2 x 3.9 cm, previously 5 5 x 3.7 cm. No adjacent inflammation. There is no proximal pancreatic ductal dilatation, motion limited. Spleen: Hypodense lesion in the anterior spleen is new from prior imaging, 13 mm series 2, image 7. Adrenals/Urinary Tract: No adrenal nodule. No hydronephrosis or renal calculi. No suspicious renal lesion. Unremarkable urinary bladder. Stomach/Bowel: Laxity of the anterior abdominal wall with midline ventral abdominal wall hernia containing small and to a lesser extent large bowel. There is no bowel obstruction or inflammation. Moderate volume of stool in the colon. Scattered colonic diverticula without diverticulitis. The stomach is nondistended. Vascular/Lymphatic: Minor aortic atherosclerosis. No aortic aneurysm. No definite enlarged lymph nodes on this motion limited exam. Reproductive: Status post hysterectomy. No adnexal masses. Other: No ascites. Small fat containing periumbilical hernia. No definite omental thickening. Musculoskeletal: Advanced degenerative change throughout the spine. Advanced bilateral hip arthropathy. Advanced bilateral sacroiliac arthropathy. No evidence of focal bone lesion or acute osseous findings. IMPRESSION: 1. Multiple hypodense hepatic lesions, increased in number and size from prior imaging, highly suspicious for metastatic disease. 2. Hypodense  lesion in the pancreatic tail measures 7.2 x 3.9 cm, increased in size from prior. The increase in size favors pancreatic neoplasm, with additional etiologies less likely. 3. New hypodense lesion in the anterior spleen, 13 mm, suspicious for metastatic disease. 4. Laxity of the anterior abdominal wall with midline ventral abdominal wall hernia containing small and to a lesser extent large bowel. No bowel obstruction or inflammation. 5. Colonic diverticulosis without diverticulitis. Aortic Atherosclerosis (ICD10-I70.0). Electronically Signed   By: Ivette Loyal.D.  On: 11/05/2023 23:00    Labs:  CBC: Recent Labs    09/29/23 0709 11/05/23 1541 11/06/23 0449 11/09/23 0503  WBC 7.9 8.3 8.8 8.1  HGB 12.2 13.4 13.1 12.5  HCT 36.8 41.6 40.5 38.8  PLT 288 277 280 248    COAGS: Recent Labs    11/09/23 0503  INR 1.1    BMP: Recent Labs    09/28/23 1019 09/29/23 0709 11/05/23 1541 11/06/23 0449  NA 142 136 135 138  K 3.8 3.6 3.9 3.7  CL 103 100 99 100  CO2 25 27 29 30   GLUCOSE 264* 188* 227* 275*  BUN 15 14 10 11   CALCIUM 8.7* 8.9 9.0 9.1  CREATININE 0.85 0.63 0.70 0.78  GFRNONAA >60 >60 >60 >60    LIVER FUNCTION TESTS: Recent Labs    06/29/23 0327 08/21/23 0924 11/05/23 1541 11/06/23 0449  BILITOT 0.3 0.7 0.9 0.8  AST 14* 12* 12* 13*  ALT 17 18 12 10   ALKPHOS 66 95 80 77  PROT 7.6 8.2* 7.8 7.2  ALBUMIN 3.8 3.6 3.8 3.6    TUMOR MARKERS: No results for input(s): "AFPTM", "CEA", "CA199", "CHROMGRNA" in the last 8760 hours.  Assessment and Plan:  Liver lesions; request for image guided liver lesion biopsy - WBC, Hgb, Plts, INR within acceptable range - VSS, afebrile - 11/05/23 CT pertinent findings:   Multiple hypodense hepatic lesions, increased in number and size from prior imaging, highly suspicious for metastatic disease. Hypodense lesion in the pancreatic tail measures 7.2 x 3.9 cm, increased in size from prior. The increase in size favors  pancreatic neoplasm, with additional etiologies less likely. New hypodense lesion in the anterior spleen, 13 mm, suspicious for metastatic disease. - 11/09/23 lovenox held, NPO since MN except sips with meds, able to consent - case reviewed by Dr. Loreta Ave, plan to perform procedure 11/09/23  Risks and benefits of image guided liver lesion biopsy was discussed with the patient and/or patient's family including, but not limited to bleeding, infection, damage to adjacent structures or low yield requiring additional tests.  All of the questions were answered and there is agreement to proceed.  Consent signed and in chart.  Thank you for allowing our service to participate in Zandra Lajeunesse 's care.    Electronically Signed: Carlton Adam, NP   11/09/2023, 9:34 AM     I spent a total of 20 Minutes    in face to face in clinical consultation, greater than 50% of which was counseling/coordinating care for image guided liver lesion biopsy.   (A copy of this note was sent to the referring provider and the time of visit.)

## 2023-11-09 NOTE — Inpatient Diabetes Management (Signed)
 Inpatient Diabetes Program Recommendations  AACE/ADA: New Consensus Statement on Inpatient Glycemic Control   Target Ranges:  Prepandial:   less than 140 mg/dL      Peak postprandial:   less than 180 mg/dL (1-2 hours)      Critically ill patients:  140 - 180 mg/dL    Latest Reference Range & Units 11/08/23 08:21 11/08/23 11:16 11/08/23 16:40 11/08/23 21:02 11/09/23 08:08  Glucose-Capillary 70 - 99 mg/dL 409 (H) 811 (H) 914 (H) 317 (H) 238 (H)   Review of Glycemic Control  Diabetes history: DM2 Outpatient Diabetes medications: Metformin 1000 mg BID Current orders for Inpatient glycemic control: Novolog 0-15 units TID with meals, Novolog 0-5 units QHS  Inpatient Diabetes Program Recommendations:    Insulin: Please consider ordering Semglee 6 units Q24H. Once diet is resumed, please consider ordering Novolog 3 units TID with meals for meal coverage if patient eats at least 50% of meals.  Thanks, Orlando Penner, RN, MSN, CDCES Diabetes Coordinator Inpatient Diabetes Program [phone] (Team Pager from 8am to 5pm)

## 2023-11-09 NOTE — Progress Notes (Signed)
 OT Cancellation Note  Patient Details Name: Kristina Cruz MRN: [id number] DOB: [birth year]   Cancelled Treatment:    Reason Eval/Treat Not Completed: Other (comment). Pt reporting frustration over not being able to eat breakfast and carry on with her normal routine d/t awaiting to have biopsy this morning. OT spoke with nurse to find out if a time had been determined, but it has not. Pt declining to perform any tasks with OT this morning d/t frustration. Pt up in recliner with all needs in place. Will re-attempt at later date/time as available.  Constance Goltz 11/09/2023, 9:25 AM

## 2023-11-09 NOTE — Procedures (Addendum)
 Interventional Radiology Procedure Note  Procedure: US guided biopsy of right liver mass. Suspect pancreas mets Complications: None EBL: None Recommendations: - Bedrest 2 hours. Then ambulate per primary team - ok to advance diet per primary order   - Routine wound care - Follow up pathology - ok to restart [facility] tomorrow on schedule in am    Signed,  Gilmer Mor, DO

## 2023-11-09 NOTE — TOC Initial Note (Signed)
 Transition of Care [facility]) - Initial/Assessment Note    Patient Details  Name: Kristina Cruz MRN: [id number] Date of Birth: Nov 01, 1960  [facility] CM/SW Contact:    Cherre Blanc, RN Phone Number: 11/09/2023, 10:40 AM  Clinical Narrative:                 Patient is from home. She has PCP personal care aides that are in the home for three hours a day. The PCP aides assist her with her medications. She has family that drives and will transport her home at dc. TOC called Roxy Manns [phone] and arranged to resume [facility] PT/OT at discharge. No other needs identified. TOC will continue to follow.  Expected Discharge Plan: Home w Home Health Services Barriers to Discharge: No Barriers Identified   Patient Goals and CMS Choice            Expected Discharge Plan and Services   Discharge Planning Services: CM Consult Post Acute Care Choice: Home Health Living arrangements for the past 2 months: Single Family Home                   DME Agency: Other - Comment ([facility] for PT/OT and [facility] for Care Aides)       HH Arranged: PT, OT (Resume with [facility])          Prior Living Arrangements/Services Living arrangements for the past 2 months: Single Family Home Lives with:: Self              Current home services: DME (RW, Motorized Chair, Paediatric nurse)    Activities of Daily Living   ADL Screening (condition at time of admission) Independently performs ADLs?: No Does the patient have a NEW difficulty with bathing/dressing/toileting/self-feeding that is expected to last >3 days?: No Does the patient have a NEW difficulty with getting in/out of bed, walking, or climbing stairs that is expected to last >3 days?: No Does the patient have a NEW difficulty with communication that is expected to last >3 days?: No Is the patient deaf or have difficulty hearing?: No Does the patient have difficulty seeing, even when wearing glasses/contacts?: No Does the  patient have difficulty concentrating, remembering, or making decisions?: No  Permission Sought/Granted                  Emotional Assessment Appearance:: Appears stated age Attitude/Demeanor/Rapport: Self-Absorbed Affect (typically observed): Anxious Orientation: : Oriented to Self, Oriented to Place, Oriented to  Time, Oriented to Situation   Psych Involvement: No (comment)  Admission diagnosis:  Epigastric pain [R10.13] Pancreatic mass [K86.89] Pancreatic cancer metastasized to liver (HCC) [C25.9, C78.7] Intractable pain [R52] Patient Active Problem List   Diagnosis Date Noted   Essential hypertension 11/06/2023   Dyslipidemia 11/06/2023   Type 2 diabetes mellitus with peripheral neuropathy (HCC) 11/06/2023   Chronic obstructive pulmonary disease (COPD) (HCC) 11/06/2023   Intractable pain 11/06/2023   Palliative care encounter 11/06/2023   Epigastric pain 11/06/2023   Liver mass 11/06/2023   Pancreatic cancer metastasized to liver (HCC) 11/05/2023   Acute asthma exacerbation 09/28/2023   Acute hypoxic respiratory failure (HCC) 08/21/2023   Pneumonia due to COVID-19 virus 08/21/2023   Pancreatic mass 08/21/2023   Spinal stenosis of lumbar region 08/21/2023   Degeneration of lumbar intervertebral disc 08/21/2023   Mastoid effusion, left 06/30/2023   Sinusitis 06/28/2023   Type 2 diabetes mellitus (HCC) 06/28/2023   Asthma exacerbation 06/28/2023   COPD with acute bronchitis (HCC) 04/18/2023  COPD with acute exacerbation (HCC) 04/18/2023   Respiratory failure with hypoxia (HCC) 02/10/2023   Uncontrolled type 2 diabetes mellitus with hyperglycemia, without long-term current use of insulin (HCC) 02/08/2018   Abnormal ECG 12/31/2017   Chest pain with low risk for cardiac etiology 12/31/2017   Preop cardiovascular exam 12/31/2017   SOB (shortness of breath) on exertion 12/31/2017   Restless leg syndrome 12/16/2017   Hypertension 12/16/2017   Endometrial hyperplasia  without atypia, simple 12/03/2017   Endometrial polyp 11/11/2017   Post-menopausal bleeding 11/11/2017   Endometrial thickening on ultrasound 04/27/2017   Obesity, Class III, BMI 40-49.9 (morbid obesity) (HCC) 04/27/2017   PCP:  Dudley Major, FNP Pharmacy:   [id number] [place], [place] - [address] AT [facility] [address] [place] [address] Phone: [phone] Fax: [phone]     Social Drivers of Health (SDOH) Social History: SDOH Screenings   Food Insecurity: No Food Insecurity (11/06/2023)  Housing: Low Risk  (11/06/2023)  Recent Concern: Housing - High Risk (08/22/2023)  Transportation Needs: No Transportation Needs (11/06/2023)  Recent Concern: Transportation Needs - Unmet Transportation Needs (09/28/2023)  Utilities: Not At Risk (11/06/2023)  Financial Resource Strain: Low Risk  (09/26/2022)   Received from [facility], [facility]  Social Connections: Socially Isolated (11/06/2023)  Tobacco Use: Low Risk  (11/06/2023)   SDOH Interventions:     Readmission Risk Interventions    08/23/2023    4:39 PM  Readmission Risk Prevention Plan  Transportation Screening Complete  PCP or Specialist Appt within 5-7 Days Complete  Home Care Screening Complete  Medication Review (RN CM) Complete

## 2023-11-10 ENCOUNTER — Encounter (INDEPENDENT_AMBULATORY_CARE_PROVIDER_SITE_OTHER): Admitting: Vascular Surgery

## 2023-11-10 DIAGNOSIS — C787 Secondary malignant neoplasm of liver and intrahepatic bile duct: Secondary | ICD-10-CM | POA: Diagnosis not present

## 2023-11-10 DIAGNOSIS — C259 Malignant neoplasm of pancreas, unspecified: Secondary | ICD-10-CM | POA: Diagnosis not present

## 2023-11-10 LAB — GLUCOSE, CAPILLARY: Glucose-Capillary: 241 mg/dL — ABNORMAL HIGH (ref 70–99)

## 2023-11-10 MED ORDER — OXYCODONE HCL 5 MG PO TABS: 5.0000 mg | ORAL_TABLET | ORAL | 0 refills | Status: DC | PRN

## 2023-11-10 MED ORDER — OXYCODONE HCL ER 10 MG PO T12A: 10.0000 mg | EXTENDED_RELEASE_TABLET | Freq: Two times a day (BID) | ORAL | 0 refills | Status: DC

## 2023-11-10 MED ORDER — AMLODIPINE BESYLATE 10 MG PO TABS: 10.0000 mg | ORAL_TABLET | Freq: Every day | ORAL | 0 refills | Status: DC

## 2023-11-10 MED ORDER — SENNOSIDES-DOCUSATE SODIUM 8.6-50 MG PO TABS: 1.0000 | ORAL_TABLET | Freq: Every day | ORAL | 0 refills | Status: AC

## 2023-11-10 NOTE — TOC Transition Note (Signed)
 Transition of Care [facility]) - Discharge Note   Patient Details  Name: Kristina Cruz MRN: [id number] Date of Birth: 21-Sep-1960  Transition of Care [facility]) CM/SW Contact:  Cherre Blanc, RN Phone Number: 11/10/2023, 10:39 AM   Clinical Narrative:     Patient medically clear for dc to home with [facility] PT/OT with [facility]. No other TOC needs identified.  Final next level of care: Home w Home Health Services Barriers to Discharge: No Barriers Identified   Patient Goals and CMS Choice            Discharge Placement                       Discharge Plan and Services Additional resources added to the After Visit Summary for     Discharge Planning Services: CM Consult Post Acute Care Choice: Home Health            DME Agency: Other - Comment ([facility] for PT/OT and [facility] for Care Aides)       HH Arranged: PT, OT (HH PT OT via [facility]) HH Agency: [facility] Date [facility] Agency Contacted: 11/09/23   Representative spoke with at [facility] Agency: [place]  Social Drivers of Health (SDOH) Interventions SDOH Screenings   Food Insecurity: No Food Insecurity (11/06/2023)  Housing: Low Risk  (11/06/2023)  Recent Concern: Housing - High Risk (08/22/2023)  Transportation Needs: No Transportation Needs (11/06/2023)  Recent Concern: Transportation Needs - Unmet Transportation Needs (09/28/2023)  Utilities: Not At Risk (11/06/2023)  Financial Resource Strain: Low Risk  (09/26/2022)   Received from [facility], [facility]  Social Connections: Socially Isolated (11/06/2023)  Tobacco Use: Low Risk  (11/06/2023)     Readmission Risk Interventions    08/23/2023    4:39 PM  Readmission Risk Prevention Plan  Transportation Screening Complete  PCP or Specialist Appt within 5-7 Days Complete  Home Care Screening Complete  Medication Review (RN CM) Complete

## 2023-11-10 NOTE — Discharge Summary (Signed)
 Physician Discharge Summary  Kristina Cruz WUX:[id number] DOB: 28-Mar-1961 DOA: 11/05/2023  PCP: Dudley Major, FNP  Admit date: 11/05/2023 Discharge date: 11/10/2023  Admitted From: Home Disposition:  Home w home health  Recommendations for Outpatient Follow-up:  Follow up with PCP in 1-2 weeks Follow up in cancer center ASAP  Home Health:Yes PT OT  Equipment/Devices:None  Discharge Condition:Stable  CODE STATUS:DNR  Diet recommendation: Reg  Brief/Interim Summary:  63 y.o. female with medical history significant for asthma, type 2 diabetes mellitus, GERD, hypertension and restless leg syndrome as well as pancreatic mass diagnosed in January, presented to the emergency room with acute onset of generalized abdominal pain which has been worsening over the last couple of months.  It has been difficult to eat lately.  She has been having significant diminished appetite.  She admits nausea and vomiting without bilious vomitus or hematemesis.  She lost about 10 pounds over the last few weeks.  No fever or chills.  No dysuria, oliguria or hematuria or flank pain.  No cough or wheezing or hemoptysis.  Not chest pain or palpitations.       Discharge Diagnoses:  Principal Problem:   Pancreatic cancer metastasized to liver [facility]) Active Problems:   Dyslipidemia   Essential hypertension   Type 2 diabetes mellitus with peripheral neuropathy (HCC)   Chronic obstructive pulmonary disease (COPD) (HCC)   Intractable pain   Palliative care encounter   Epigastric pain   Liver mass  Pancreatic cancer metastasized to liver [facility]) Intractable cancer related pain Intractable nausea vomiting Patient's has a likely diagnosis based on imaging.  She was initially noted to have a pancreatic mass in January 2025.  She was lost to follow-up subsequently and presented back to the emergency room on 3/20 complaining of intractable abdominal pain associated with nausea vomiting and poor p.o. intake.   Imaging findings very concerning for metastatic pancreatic carcinoma.  Oncology and palliative care involved. Plan: Patient is status post IR guided biopsy of lesion in liver.  Suspected pancreatic metastasis.  Biopsy results pending at time of discharge.  Multimodal pain regimen.  Discharge home with close follow-up in the outpatient cancer center.  Message sent to Laurette Schimke NP to facilitate follow-up.  Discharge Instructions  Discharge Instructions     Diet - low sodium heart healthy   Complete by: As directed    Increase activity slowly   Complete by: As directed    No wound care   Complete by: As directed       Allergies as of 11/10/2023       Reactions   Peanut-containing Drug Products         Medication List     TAKE these medications    acetaminophen 650 MG CR tablet Commonly known as: Tylenol 8 Hour Arthritis Pain Take 3 tablets (1,950 mg total) by mouth every 8 (eight) hours as needed for pain. Do not exceed 4000 mg total in a day.  Home med.   amLODipine 10 MG tablet Commonly known as: NORVASC Take 1 tablet (10 mg total) by mouth daily.   azelastine 0.1 % nasal spray Commonly known as: ASTELIN Place 1 spray into both nostrils 2 (two) times daily.   EPINEPHrine 0.3 mg/0.3 mL Soaj injection Commonly known as: EPI-PEN Inject 0.3 mg into the muscle as needed for anaphylaxis.   fluticasone 50 MCG/ACT nasal spray Commonly known as: FLONASE Place 1 spray into both nostrils daily.   fluticasone furoate-vilanterol 100-25 MCG/ACT Aepb Commonly known as: Engineer, materials  Ellipta Inhale 1 puff into the lungs daily.   furosemide 40 MG tablet Commonly known as: LASIX Take 40 mg by mouth every other day.   gabapentin 300 MG capsule Commonly known as: NEURONTIN Take 600 mg by mouth 2 (two) times daily.   ipratropium-albuterol 0.5-2.5 (3) MG/3ML Soln Commonly known as: DUONEB Take 3 mLs by nebulization every 4 (four) hours as needed (wheezing / shortness of breath).    levocetirizine 5 MG tablet Commonly known as: XYZAL Take 5 mg by mouth daily.   metFORMIN 1000 MG tablet Commonly known as: GLUCOPHAGE Take 1,000 mg by mouth 2 (two) times daily.   oxyCODONE 5 MG immediate release tablet Commonly known as: Oxy IR/ROXICODONE Take 1 tablet (5 mg total) by mouth every 4 (four) hours as needed for moderate pain (pain score 4-6) or severe pain (pain score 7-10).   oxyCODONE 10 mg 12 hr tablet Commonly known as: OXYCONTIN Take 1 tablet (10 mg total) by mouth every 12 (twelve) hours for 14 days.   rOPINIRole 2 MG tablet Commonly known as: REQUIP Take 2 mg by mouth in the morning and at bedtime.   Semaglutide (1 MG/DOSE) 4 MG/3ML Sopn Inject 1 mg into the skin once a week.   senna-docusate 8.6-50 MG tablet Commonly known as: Senokot-S Take 1 tablet by mouth daily.   Ventolin HFA 108 (90 Base) MCG/ACT inhaler Generic drug: albuterol Inhale 2 puffs into the lungs every 4 (four) hours as needed for wheezing or shortness of breath.        Follow-up Information     Dudley Major, FNP Follow up on 12/01/2023.   Specialties: Nurse Practitioner, Family Medicine Why: @10AM  Contact information: [address] [place] [address] [phone]                Allergies  Allergen Reactions   Peanut-Containing Drug Products     Consultations: Oncology Palliative care   Procedures/Studies: US BIOPSY (LIVER) Result Date: 11/09/2023 INDICATION: 63 year old female presents for biopsy of liver lesion EXAM: ULTRASOUND-GUIDED LIVER MASS BIOPSY MEDICATIONS: None. ANESTHESIA/SEDATION: Moderate (conscious) sedation was employed during this procedure. A total of Versed 1.0 mg and Fentanyl 50 mcg was administered intravenously by the radiology nurse. Total intra-service moderate Sedation Time: 10 minutes. The patient's level of consciousness and vital signs were monitored continuously by radiology nursing throughout the procedure under my direct  supervision. COMPLICATIONS: None PROCEDURE: Informed written consent was obtained from the patient after a thorough discussion of the procedural risks, benefits and alternatives. All questions were addressed. Maximal Sterile Barrier Technique was utilized including caps, mask, sterile gowns, sterile gloves, sterile drape, hand hygiene and skin antiseptic. A timeout was performed prior to the initiation of the procedure. Ultrasound survey of the right liver lobe performed with images stored and sent to PACs. The right lower thorax/right upper abdomen was prepped with chlorhexidine in a sterile fashion, and a sterile drape was applied covering the operative field. A sterile gown and sterile gloves were used for the procedure. Local anesthesia was provided with 1% Lidocaine. The patient was prepped and draped sterilely and the skin and subcutaneous tissues were generously infiltrated with 1% lidocaine. A 17 gauge introducer needle was then advanced under ultrasound guidance in sub costal location into the right liver lobe, targeting hypoechoic mass of the right liver. The stylet was removed, and multiple separate 18 gauge core biopsy were retrieved. Samples were placed into formalin for transportation to the lab. Gel-Foam slurry then infused with a small amount  of saline for assistance with hemostasis. The needle was removed, and a final ultrasound image was performed. The patient tolerated the procedure well and remained hemodynamically stable throughout. No complications were encountered and no significant blood loss was encounter. IMPRESSION: Status post ultrasound-guided liver mass biopsy Signed, Yvone Neu. Miachel Roux, RPVI [facility] Electronically Signed   By: Gilmer Mor D.O.   On: 11/09/2023 15:11   CT ABDOMEN PELVIS W CONTRAST Result Date: 11/05/2023 CLINICAL DATA:  Acute nonlocalized abdominal pain. Patient reports pain for 2 months,  progressive. EXAM: CT ABDOMEN AND PELVIS WITH CONTRAST TECHNIQUE: Multidetector CT imaging of the abdomen and pelvis was performed using the standard protocol following bolus administration of intravenous contrast. RADIATION DOSE REDUCTION: This exam was performed according to the departmental dose-optimization program which includes automated exposure control, adjustment of the mA and/or kV according to patient size and/or use of iterative reconstruction technique. CONTRAST:  OMNIPAQUE IOHEXOL 350 MG/ML SOLN COMPARISON:  CT 08/21/2023, MRI 08/24/2023 FINDINGS: Lower chest: No basilar airspace disease or pleural effusion. Hepatobiliary: Multiple hypodense hepatic lesions, increased in number and size from prior imaging. Motion artifact limits detailed assessment. Largest lesion is in the inferior right hepatic lobe measuring 2.6 cm, series 2, image 35, not previously demonstrated on CT. Subcapsular lesion in the right lobe measures 19 mm, series 2, image 17, not previously demonstrated on CT. Lesion in the left lobe measures 16 mm, series 2, image 18, not previously demonstrated on CT. Cholecystectomy without biliary dilatation. Pancreas: Hypodense lesion in the pancreatic tail measures 7.2 x 3.9 cm, previously 5 5 x 3.7 cm. No adjacent inflammation. There is no proximal pancreatic ductal dilatation, motion limited. Spleen: Hypodense lesion in the anterior spleen is new from prior imaging, 13 mm series 2, image 7. Adrenals/Urinary Tract: No adrenal nodule. No hydronephrosis or renal calculi. No suspicious renal lesion. Unremarkable urinary bladder. Stomach/Bowel: Laxity of the anterior abdominal wall with midline ventral abdominal wall hernia containing small and to a lesser extent large bowel. There is no bowel obstruction or inflammation. Moderate volume of stool in the colon. Scattered colonic diverticula without diverticulitis. The stomach is nondistended. Vascular/Lymphatic: Minor aortic atherosclerosis.  No aortic aneurysm. No definite enlarged lymph nodes on this motion limited exam. Reproductive: Status post hysterectomy. No adnexal masses. Other: No ascites. Small fat containing periumbilical hernia. No definite omental thickening. Musculoskeletal: Advanced degenerative change throughout the spine. Advanced bilateral hip arthropathy. Advanced bilateral sacroiliac arthropathy. No evidence of focal bone lesion or acute osseous findings. IMPRESSION: 1. Multiple hypodense hepatic lesions, increased in number and size from prior imaging, highly suspicious for metastatic disease. 2. Hypodense lesion in the pancreatic tail measures 7.2 x 3.9 cm, increased in size from prior. The increase in size favors pancreatic neoplasm, with additional etiologies less likely. 3. New hypodense lesion in the anterior spleen, 13 mm, suspicious for metastatic disease. 4. Laxity of the anterior abdominal wall with midline ventral abdominal wall hernia containing small and to a lesser extent large bowel. No bowel obstruction or inflammation. 5. Colonic diverticulosis without diverticulitis. Aortic Atherosclerosis (ICD10-I70.0). Electronically Signed   By: Narda Rutherford M.D.   On: 11/05/2023 23:00      Subjective: Seen and examined on the day of discharge.  Stable no distress.  Appropriate for discharge home.  Discharge Exam: Vitals:   11/10/23 0405 11/10/23 0755  BP: (!) 142/82 136/75  Pulse: 94 95  Resp:  18  Temp: 98.6 F (37 C) 98.3 F (  36.8 C)  SpO2: 92% 94%   Vitals:   11/09/23 1515 11/09/23 1939 11/10/23 0405 11/10/23 0755  BP: (!) 114/96 (!) 144/78 (!) 142/82 136/75  Pulse: 83 78 94 95  Resp: 20   18  Temp:  97.9 F (36.6 C) 98.6 F (37 C) 98.3 F (36.8 C)  TempSrc:      SpO2: 92% 91% 92% 94%  Weight:      Height:        General: Pt is alert, awake, not in acute distress Cardiovascular: RRR, S1/S2 +, no rubs, no gallops Respiratory: CTA bilaterally, no wheezing, no rhonchi Abdominal: Soft, NT,  ND, bowel sounds + Extremities: no edema, no cyanosis    The results of significant diagnostics from this hospitalization (including imaging, microbiology, ancillary and laboratory) are listed below for reference.     Microbiology: No results found for this or any previous visit (from the past 240 hours).   Labs: BNP (last 3 results) Recent Labs    02/10/23 1407 04/18/23 0045 08/21/23 0924  BNP 13.6 23.1 37.7   Basic Metabolic Panel: Recent Labs  Lab 11/05/23 1541 11/06/23 0449  NA 135 138  K 3.9 3.7  CL 99 100  CO2 29 30  GLUCOSE 227* 275*  BUN 10 11  CREATININE 0.70 0.78  CALCIUM 9.0 9.1   Liver Function Tests: Recent Labs  Lab 11/05/23 1541 11/06/23 0449  AST 12* 13*  ALT 12 10  ALKPHOS 80 77  BILITOT 0.9 0.8  PROT 7.8 7.2  ALBUMIN 3.8 3.6   Recent Labs  Lab 11/05/23 1541  LIPASE 24   No results for input(s): "AMMONIA" in the last 168 hours. CBC: Recent Labs  Lab 11/05/23 1541 11/06/23 0449 11/09/23 0503  WBC 8.3 8.8 8.1  HGB 13.4 13.1 12.5  HCT 41.6 40.5 38.8  MCV 86.8 86.7 85.7  PLT 277 280 248   Cardiac Enzymes: No results for input(s): "CKTOTAL", "CKMB", "CKMBINDEX", "TROPONINI" in the last 168 hours. BNP: Invalid input(s): "POCBNP" CBG: Recent Labs  Lab 11/09/23 0808 11/09/23 1115 11/09/23 1527 11/09/23 2111 11/10/23 0752  GLUCAP 238* 240* 218* 336* 241*   D-Dimer No results for input(s): "DDIMER" in the last 72 hours. Hgb A1c No results for input(s): "HGBA1C" in the last 72 hours. Lipid Profile No results for input(s): "CHOL", "HDL", "LDLCALC", "TRIG", "CHOLHDL", "LDLDIRECT" in the last 72 hours. Thyroid function studies No results for input(s): "TSH", "T4TOTAL", "T3FREE", "THYROIDAB" in the last 72 hours.  Invalid input(s): "FREET3" Anemia work up No results for input(s): "VITAMINB12", "FOLATE", "FERRITIN", "TIBC", "IRON", "RETICCTPCT" in the last 72 hours. Urinalysis    Component Value Date/Time   COLORURINE  YELLOW (A) 11/06/2023 1036   APPEARANCEUR CLEAR (A) 11/06/2023 1036   APPEARANCEUR Cloudy 08/11/2013 0124   LABSPEC >1.046 (H) 11/06/2023 1036   LABSPEC 1.021 08/11/2013 0124   PHURINE 5.0 11/06/2023 1036   GLUCOSEU 50 (A) 11/06/2023 1036   GLUCOSEU Negative 08/11/2013 0124   HGBUR NEGATIVE 11/06/2023 1036   BILIRUBINUR NEGATIVE 11/06/2023 1036   BILIRUBINUR Negative 08/11/2013 0124   KETONESUR NEGATIVE 11/06/2023 1036   PROTEINUR NEGATIVE 11/06/2023 1036   NITRITE NEGATIVE 11/06/2023 1036   LEUKOCYTESUR NEGATIVE 11/06/2023 1036   LEUKOCYTESUR Negative 08/11/2013 0124   Sepsis Labs Recent Labs  Lab 11/05/23 1541 11/06/23 0449 11/09/23 0503  WBC 8.3 8.8 8.1   Microbiology No results found for this or any previous visit (from the past 240 hours).   Time coordinating discharge: Over 30 minutes  SIGNED:   Tresa Moore, MD  [facility] 11/10/2023, 1:40 PM Pager   If 7PM-7AM, please contact night-coverage

## 2023-11-11 ENCOUNTER — Telehealth: Payer: Self-pay

## 2023-11-11 ENCOUNTER — Telehealth: Payer: Self-pay | Admitting: Oncology

## 2023-11-11 LAB — SURGICAL PATHOLOGY

## 2023-11-11 NOTE — Telephone Encounter (Signed)
 Spoke with pt and appt is scheduled for this Friday. Address given to pt as well.

## 2023-11-11 NOTE — Telephone Encounter (Signed)
 Kristina Cruz can you please arrange pt to get follow up MD visit this week to go over biopsy pathology.

## 2023-11-11 NOTE — Telephone Encounter (Signed)
-----   Message from Rickard Patience sent at 11/11/2023  9:47 AM EDT ----- Please arrange pt to get follow up MD visit this week to go over biopsy pathology. I saw her during her admission.

## 2023-11-13 ENCOUNTER — Telehealth: Payer: Self-pay | Admitting: Oncology

## 2023-11-13 ENCOUNTER — Inpatient Hospital Stay: Admitting: Oncology

## 2023-11-13 NOTE — Telephone Encounter (Signed)
 Kristina Cruz called and left message with answering service to cancel her 12:30 appt today and call her to reschedule. Team was notified.

## 2023-11-17 ENCOUNTER — Inpatient Hospital Stay: Attending: Oncology | Admitting: Oncology

## 2023-11-17 ENCOUNTER — Encounter: Payer: Self-pay | Admitting: Oncology

## 2023-11-17 VITALS — BP 152/75 | HR 61 | Temp 96.9°F | Resp 18 | Wt 270.0 lb

## 2023-11-17 DIAGNOSIS — Z803 Family history of malignant neoplasm of breast: Secondary | ICD-10-CM | POA: Insufficient documentation

## 2023-11-17 DIAGNOSIS — C787 Secondary malignant neoplasm of liver and intrahepatic bile duct: Secondary | ICD-10-CM | POA: Diagnosis not present

## 2023-11-17 DIAGNOSIS — Z515 Encounter for palliative care: Secondary | ICD-10-CM

## 2023-11-17 DIAGNOSIS — G893 Neoplasm related pain (acute) (chronic): Secondary | ICD-10-CM | POA: Diagnosis not present

## 2023-11-17 DIAGNOSIS — Z7985 Long-term (current) use of injectable non-insulin antidiabetic drugs: Secondary | ICD-10-CM | POA: Insufficient documentation

## 2023-11-17 DIAGNOSIS — C259 Malignant neoplasm of pancreas, unspecified: Secondary | ICD-10-CM

## 2023-11-17 DIAGNOSIS — Z8041 Family history of malignant neoplasm of ovary: Secondary | ICD-10-CM | POA: Diagnosis not present

## 2023-11-17 DIAGNOSIS — E1165 Type 2 diabetes mellitus with hyperglycemia: Secondary | ICD-10-CM | POA: Diagnosis not present

## 2023-11-17 DIAGNOSIS — Z5111 Encounter for antineoplastic chemotherapy: Secondary | ICD-10-CM | POA: Diagnosis present

## 2023-11-17 DIAGNOSIS — E66813 Obesity, class 3: Secondary | ICD-10-CM | POA: Insufficient documentation

## 2023-11-17 DIAGNOSIS — C252 Malignant neoplasm of tail of pancreas: Secondary | ICD-10-CM | POA: Diagnosis not present

## 2023-11-17 MED ORDER — PROCHLORPERAZINE MALEATE 10 MG PO TABS: 10.0000 mg | ORAL_TABLET | Freq: Four times a day (QID) | ORAL | 1 refills | Status: DC | PRN

## 2023-11-17 MED ORDER — OXYCODONE HCL ER 10 MG PO T12A: 10.0000 mg | EXTENDED_RELEASE_TABLET | Freq: Two times a day (BID) | ORAL | 0 refills | Status: DC

## 2023-11-17 MED ORDER — ONDANSETRON HCL 8 MG PO TABS: 8.0000 mg | ORAL_TABLET | Freq: Three times a day (TID) | ORAL | 1 refills | Status: DC | PRN

## 2023-11-17 NOTE — Assessment & Plan Note (Signed)
 Imaging findings and different lesion biopsy results were reviewed and discussed with patient. 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  Other differential diagnosis include GI tract adenocarcinoma, cholangiocarcinoma etc. CEA is elevated.  His CA 19-9 is normal. CT and MRI imaging is did not show any GI primary.I recommend a PET scan for further evaluation.  I recommend palliative chemotherapy with gemcitabine Abraxane. I explained to the patient the risks and benefits of chemotherapy including all but not limited to infusion reaction, hair loss, hearing loss, mouth sore, nausea, vomiting, low blood counts, bleeding, neuropathy and risk of life threatening infection and even death, secondary malignancy etc.  Patient voices understanding and willing to proceed chemotherapy.   # Chemotherapy education; discussed with patient about option of Medi port placement-patient would like to utilize peripheral IV access first. Antiemetics-Zofran and Compazine; EMLA cream sent to pharmacy Patient currently does not want family members to be involved.  Supportive care measures are necessary for patient well-being and will be provided as necessary. We spent sufficient time to discuss many aspect of care, questions were answered to patient's satisfaction.

## 2023-11-17 NOTE — Progress Notes (Signed)
 Hematology/Oncology Progress note [phone] [phone]        REFERRING PROVIDER: Dudley Major, FNP    CHIEF COMPLAINTS/PURPOSE OF CONSULTATION:  Stage IV pancreatic carcinoma with liver metastasis.  ASSESSMENT & PLAN:   Pancreatic cancer metastasized to liver [facility]) Imaging findings and different lesion biopsy results were reviewed and discussed with patient. 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  Other differential diagnosis include GI tract adenocarcinoma, cholangiocarcinoma etc. CEA is elevated.  His CA 19-9 is normal. CT and MRI imaging is did not show any GI primary.I recommend a PET scan for further evaluation.  I recommend palliative chemotherapy with gemcitabine Abraxane. I explained to the patient the risks and benefits of chemotherapy including all but not limited to infusion reaction, hair loss, hearing loss, mouth sore, nausea, vomiting, low blood counts, bleeding, neuropathy and risk of life threatening infection and even death, secondary malignancy etc.  Patient voices understanding and willing to proceed chemotherapy.   # Chemotherapy education; discussed with patient about option of Medi port placement-patient would like to utilize peripheral IV access first. Antiemetics-Zofran and Compazine; EMLA cream sent to pharmacy Patient currently does not want family members to be involved.  Supportive care measures are necessary for patient well-being and will be provided as necessary. We spent sufficient time to discuss many aspect of care, questions were answered to patient's satisfaction.   Neoplasm related pain Recommend patient to continue OxyContin 10 mg every 12 hours, plus oxycodone 5 mg every 4-6 hours as needed  Obesity, Class III, BMI 40-49.9 (morbid obesity) (HCC) on Ozempic.  Recommend healthy diet, lifestyle modification  Uncontrolled type 2 diabetes mellitus with hyperglycemia,  without long-term current use of insulin (HCC) Continue follow-up with primary care provider.  Last A1c 8.7.   Orders Placed This Encounter  Procedures   Consent Attestation for Oncology Treatment    The patient is informed of risks, benefits, side-effects of the prescribed oncology treatment. Potential short term and long term side effects and response rates discussed. After a long discussion, the patient made informed decision to proceed.:   Yes   NM PET Image Initial (PI) Skull Base To Thigh    Standing Status:   Future    Expected Date:   11/24/2023    Expiration Date:   11/16/2024    If indicated for the ordered procedure, I authorize the administration of a radiopharmaceutical per Radiology protocol:   Yes    Preferred imaging location?:   [facility]   Ambulatory Referral to Palliative Care    Referral Priority:   Routine    Referral Type:   Consultation    Referral Reason:   Symptom Managment    Referred to Provider:   Malachy Moan, NP    Number of Visits Requested:   1   Follow-up in 1 to 2 weeks to start chemotherapy. All questions were answered. The patient knows to call the clinic with any problems, questions or concerns.  Rickard Patience, MD, PhD [facility] Hematology Oncology 11/17/2023    HISTORY OF PRESENTING ILLNESS:  Kristina Cruz 63 y.o. female presents to establish care for  I have reviewed her chart and materials related to her cancer extensively and collaborated history with the patient. Summary of oncologic history is as follows: Oncology History  Pancreatic cancer metastasized to liver (HCC)  08/21/2023 Imaging   CT abdomen pelvis with contrast showed 1. Masslike thickening involving the proximal and mid tail of pancreas  with relative hypoenhancement measures approximately 3.9 x 5.5 cm. In the absence of signs/symptoms of acute pancreatitis underlying neoplastic process cannot be excluded. Further evaluation with nonemergent contrast enhanced MRI of  the abdomen is advised. Note: Given patient's body habitus and the motion artifact observed on the current exam an MRI obtained at this time is likely to be severely limited and likely nondiagnostic. MRI should be obtained only once the patient is clinically stable, and is able to remain motionless and breath hold. 2. Patchy ground-glass and airspace densities identified within both lower lobes. These are new compared with the previous exam and are concerning for underlying inflammatory/infectious process. 3. Sigmoid diverticulosis without signs of acute diverticulitis. 4. Marked diastasis recti with ventral herniation of the large and small bowel loops. 5. Periumbilical hernia is containing fat only.   08/24/2023 Imaging   MRI abdomen wo contrast  1. Moderately suboptimal unenhanced exam. 2. Heterogeneous masslike thickening of the pancreatic body/tail again seen. The lesion remains indeterminate on this exam. Differential diagnosis includes pancreatic tumor, autoimmune pancreatitis, mass forming chronic pancreatitis, etc. Please see above for follow-up recommendations. 3. Mild diffuse hepatic steatosis. There are indeterminate areas in the liver, which can also be better evaluated on the contrast-enhanced MRI abdomen. 4. Other observations, as described above.   11/05/2023 Initial Diagnosis   Pancreatic cancer metastasized to liver  Patient was hospitalized from 11/05/2023 - 11/10/2023 She presented to emergency room due to nausea, vomiting and abdominal pain. CT scan showed multiple hypodense hepatic lesions increasing number and size from prior imaging.  Pancreatic tail mass, hypodense lesion in the anterior spleen.  11/09/2023 ultrasound-guided biopsy was obtained. Pathology showed moderately to poorly differentiated adenocarcinoma with extensive areas of tumor necrosis. The adenocarcinoma is positive for cytokeratin 7 and CDX2 (focal).     Cytokeratin 20, TTF-1, and GATA3 are  negative. The pattern of immunoreactivity is not specific and the differential diagnosis includes metastatic adenocarcinoma from the pancreaticobiliary tract and upper GI. Intrahepatic       cholangiocarcinoma is also a diagnostic possibility. Given the radiographic  findings a pancreaticobiliary primary is favored and correlation with clinical impression is required.    11/17/2023 Cancer Staging   Staging form: Exocrine Pancreas, AJCC 8th Edition - Clinical stage from 11/17/2023: Stage IV (cT3, cNX, pM1) - Signed by Rickard Patience, MD on 11/17/2023 Stage prefix: Initial diagnosis    Imaging   CT abdomen pelvis w contrast  1. Multiple hypodense hepatic lesions, increased in number and size from prior imaging, highly suspicious for metastatic disease. 2. Hypodense lesion in the pancreatic tail measures 7.2 x 3.9 cm, increased in size from prior. The increase in size favors pancreatic neoplasm, with additional etiologies less likely. 3. New hypodense lesion in the anterior spleen, 13 mm, suspicious for metastatic disease. 4. Laxity of the anterior abdominal wall with midline ventral abdominal wall hernia containing small and to a lesser extent large bowel. No bowel obstruction or inflammation. 5. Colonic diverticulosis without diverticulitis.   Aortic Atherosclerosis (ICD10-I70.0)     Patient presents for posthospitalization follow-up. She reports that abdominal pain is controlled patient takes oxycodone 5 mg 2-3 times per day.  She has not used OxyContin since discharge. She was accompanied by her caregiver.   MEDICAL HISTORY:  Past Medical History:  Diagnosis Date   Anemia    Asthma    Back pain    Cataract    Diabetes mellitus without complication (HCC)    GERD (gastroesophageal reflux disease)    Hypertension  Morbid obesity with BMI of 60.0-69.9, adult (HCC)    Restless leg syndrome     SURGICAL HISTORY: Past Surgical History:  Procedure Laterality Date   ABDOMINAL  HYSTERECTOMY     CATARACT EXTRACTION W/PHACO Left 04/13/2020   Procedure: CATARACT EXTRACTION PHACO AND INTRAOCULAR LENS PLACEMENT (IOC);  Surgeon: Elliot Cousin, MD;  Location: [facility] ORS;  Service: Ophthalmology;  Laterality: Left;  [place] 00:36.0 CDE 5.95 Fluid Pack lot # [id number]   HYSTEROSCOPY WITH D & C N/A 07/14/2017   Procedure: DILATATION AND CURETTAGE /HYSTEROSCOPY;  Surgeon: Nadara Mustard, MD;  Location: [facility] ORS;  Service: Gynecology;  Laterality: N/A;   POLYPECTOMY  2015    SOCIAL HISTORY: Social History   Socioeconomic History   Marital status: Single    Spouse name: Not on file   Number of children: Not on file   Years of education: Not on file   Highest education level: Not on file  Occupational History   Not on file  Tobacco Use   Smoking status: Never   Smokeless tobacco: Never  Vaping Use   Vaping status: Never Used  Substance and Sexual Activity   Alcohol use: No   Drug use: No   Sexual activity: Never    Birth control/protection: None  Other Topics Concern   Not on file  Social History Narrative   Not on file   Social Drivers of Health   Financial Resource Strain: Low Risk  (09/26/2022)   [facility], [facility]   Overall Financial Resource Strain (CARDIA)    Difficulty of Paying Living Expenses: Not hard at all  Food Insecurity: No Food Insecurity (11/06/2023)   Hunger Vital Sign    Worried About Running Out of Food in the Last Year: Never true    Ran Out of Food in the Last Year: Never true  Transportation Needs: No Transportation Needs (11/06/2023)   PRAPARE - Administrator, Civil Service (Medical): No    Lack of Transportation (Non-Medical): No  Recent Concern: Transportation Needs - Unmet Transportation Needs (09/28/2023)   PRAPARE - Administrator, Civil Service (Medical): Yes    Lack of Transportation (Non-Medical): Yes  Physical Activity: Not on file  Stress: Not on  file  Social Connections: Socially Isolated (11/06/2023)   Social Connection and Isolation Panel [NHANES]    Frequency of Communication with Friends and Family: More than three times a week    Frequency of Social Gatherings with Friends and Family: More than three times a week    Attends Religious Services: Never    Database administrator or Organizations: No    Attends Banker Meetings: Never    Marital Status: Never married  Intimate Partner Violence: Not At Risk (11/06/2023)   Humiliation, Afraid, Rape, and Kick questionnaire    Fear of Current or Ex-Partner: No    Emotionally Abused: No    Physically Abused: No    Sexually Abused: No    FAMILY HISTORY: Family History  Problem Relation Age of Onset   Breast cancer Mother 40   Diabetes Mother    Hypertension Mother    Ovarian cancer Paternal Aunt        ?   Diabetes Father    Hypertension Father     ALLERGIES:  is allergic to peanut-containing drug products.  MEDICATIONS:  Current Outpatient Medications  Medication Sig Dispense Refill   acetaminophen (TYLENOL 8 HOUR ARTHRITIS PAIN) 650  MG CR tablet Take 3 tablets (1,950 mg total) by mouth every 8 (eight) hours as needed for pain. Do not exceed 4000 mg total in a day.  Home med.     amLODipine (NORVASC) 10 MG tablet Take 1 tablet (10 mg total) by mouth daily. 30 tablet 0   azelastine (ASTELIN) 0.1 % nasal spray Place 1 spray into both nostrils 2 (two) times daily.     EPINEPHrine 0.3 mg/0.3 mL IJ SOAJ injection Inject 0.3 mg into the muscle as needed for anaphylaxis. 1 each 0   fluticasone (FLONASE) 50 MCG/ACT nasal spray Place 1 spray into both nostrils daily. 9.9 mL 0   fluticasone furoate-vilanterol (BREO ELLIPTA) 100-25 MCG/ACT AEPB Inhale 1 puff into the lungs daily. 28 each 1   furosemide (LASIX) 40 MG tablet Take 40 mg by mouth every other day.     gabapentin (NEURONTIN) 300 MG capsule Take 600 mg by mouth 2 (two) times daily.     ipratropium-albuterol  (DUONEB) 0.5-2.5 (3) MG/3ML SOLN Take 3 mLs by nebulization every 4 (four) hours as needed (wheezing / shortness of breath). 360 mL 1   levocetirizine (XYZAL) 5 MG tablet Take 5 mg by mouth daily.     metFORMIN (GLUCOPHAGE) 1000 MG tablet Take 1,000 mg by mouth 2 (two) times daily.     oxyCODONE (OXY IR/ROXICODONE) 5 MG immediate release tablet Take 1 tablet (5 mg total) by mouth every 4 (four) hours as needed for moderate pain (pain score 4-6) or severe pain (pain score 7-10). 30 tablet 0   rOPINIRole (REQUIP) 2 MG tablet Take 2 mg by mouth in the morning and at bedtime.      Semaglutide, 1 MG/DOSE, 4 MG/3ML SOPN Inject 1 mg into the skin once a week.     senna-docusate (SENOKOT-S) 8.6-50 MG tablet Take 1 tablet by mouth daily. 30 tablet 0   VENTOLIN HFA 108 (90 Base) MCG/ACT inhaler Inhale 2 puffs into the lungs every 4 (four) hours as needed for wheezing or shortness of breath. 18 g 1   [START ON 11/23/2023] oxyCODONE (OXYCONTIN) 10 mg 12 hr tablet Take 1 tablet (10 mg total) by mouth every 12 (twelve) hours. 60 tablet 0   No current facility-administered medications for this visit.    Review of Systems  Constitutional:  Positive for appetite change, fatigue and unexpected weight change. Negative for chills and fever.  HENT:   Negative for hearing loss and voice change.   Eyes:  Negative for eye problems.  Respiratory:  Negative for chest tightness and cough.   Cardiovascular:  Negative for chest pain.  Gastrointestinal:  Positive for abdominal pain. Negative for abdominal distention and blood in stool.  Endocrine: Negative for hot flashes.  Genitourinary:  Negative for difficulty urinating and frequency.   Musculoskeletal:  Negative for arthralgias.  Skin:  Negative for itching and rash.  Neurological:  Negative for extremity weakness.  Hematological:  Negative for adenopathy.  Psychiatric/Behavioral:  Negative for confusion.      PHYSICAL EXAMINATION: ECOG PERFORMANCE STATUS: 1 -  Symptomatic but completely ambulatory  Vitals:   11/17/23 1450  BP: (!) 152/75  Pulse: 61  Resp: 18  Temp: (!) 96.9 F (36.1 C)  SpO2: 98%   Filed Weights   11/17/23 1450  Weight: 270 lb (122.5 kg)    Physical Exam Constitutional:      General: She is not in acute distress.    Appearance: She is obese. She is not diaphoretic.  HENT:  Head: Normocephalic and atraumatic.  Eyes:     General: No scleral icterus. Cardiovascular:     Rate and Rhythm: Normal rate and regular rhythm.     Heart sounds: No murmur heard. Pulmonary:     Effort: Pulmonary effort is normal. No respiratory distress.     Breath sounds: No wheezing.     Comments: Decreased breath sound bilaterally Abdominal:     General: Bowel sounds are normal. There is no distension.     Palpations: Abdomen is soft.  Musculoskeletal:        General: Normal range of motion.     Cervical back: Normal range of motion and neck supple.  Skin:    General: Skin is warm and dry.     Findings: No erythema.  Neurological:     Mental Status: She is alert and oriented to person, place, and time. Mental status is at baseline.     Motor: No abnormal muscle tone.  Psychiatric:        Mood and Affect: Mood and affect normal.      LABORATORY DATA:  I have reviewed the data as listed    Latest Ref Rng & Units 11/09/2023    5:03 AM 11/06/2023    4:49 AM 11/05/2023    3:41 PM  CBC  WBC 4.0 - 10.5 K/uL 8.1  8.8  8.3   Hemoglobin 12.0 - 15.0 g/dL 57.8  46.9  62.9   Hematocrit 36.0 - 46.0 % 38.8  40.5  41.6   Platelets 150 - 400 K/uL 248  280  277       Latest Ref Rng & Units 11/06/2023    4:49 AM 11/05/2023    3:41 PM 09/29/2023    7:09 AM  CMP  Glucose 70 - 99 mg/dL 528  413  244   BUN 8 - 23 mg/dL 11  10  14    Creatinine 0.44 - 1.00 mg/dL 0.10  2.72  5.36   Sodium 135 - 145 mmol/L 138  135  136   Potassium 3.5 - 5.1 mmol/L 3.7  3.9  3.6   Chloride 98 - 111 mmol/L 100  99  100   CO2 22 - 32 mmol/L 30  29  27     Calcium 8.9 - 10.3 mg/dL 9.1  9.0  8.9   Total Protein 6.5 - 8.1 g/dL 7.2  7.8    Total Bilirubin 0.0 - 1.2 mg/dL 0.8  0.9    Alkaline Phos 38 - 126 U/L 77  80    AST 15 - 41 U/L 13  12    ALT 0 - 44 U/L 10  12       RADIOGRAPHIC STUDIES: I have personally reviewed the radiological images as listed and agreed with the findings in the report. US BIOPSY (LIVER) Result Date: 11/09/2023 INDICATION: 63 year old female presents for biopsy of liver lesion EXAM: ULTRASOUND-GUIDED LIVER MASS BIOPSY MEDICATIONS: None. ANESTHESIA/SEDATION: Moderate (conscious) sedation was employed during this procedure. A total of Versed 1.0 mg and Fentanyl 50 mcg was administered intravenously by the radiology nurse. Total intra-service moderate Sedation Time: 10 minutes. The patient's level of consciousness and vital signs were monitored continuously by radiology nursing throughout the procedure under my direct supervision. COMPLICATIONS: None PROCEDURE: Informed written consent was obtained from the patient after a thorough discussion of the procedural risks, benefits and alternatives. All questions were addressed. Maximal Sterile Barrier Technique was utilized including caps, mask, sterile gowns, sterile gloves, sterile drape, hand hygiene and  skin antiseptic. A timeout was performed prior to the initiation of the procedure. Ultrasound survey of the right liver lobe performed with images stored and sent to PACs. The right lower thorax/right upper abdomen was prepped with chlorhexidine in a sterile fashion, and a sterile drape was applied covering the operative field. A sterile gown and sterile gloves were used for the procedure. Local anesthesia was provided with 1% Lidocaine. The patient was prepped and draped sterilely and the skin and subcutaneous tissues were generously infiltrated with 1% lidocaine. A 17 gauge introducer needle was then advanced under ultrasound guidance in sub costal location into the right liver lobe,  targeting hypoechoic mass of the right liver. The stylet was removed, and multiple separate 18 gauge core biopsy were retrieved. Samples were placed into formalin for transportation to the lab. Gel-Foam slurry then infused with a small amount of saline for assistance with hemostasis. The needle was removed, and a final ultrasound image was performed. The patient tolerated the procedure well and remained hemodynamically stable throughout. No complications were encountered and no significant blood loss was encounter. IMPRESSION: Status post ultrasound-guided liver mass biopsy Signed, Yvone Neu. Miachel Roux, RPVI [facility] Electronically Signed   By: Gilmer Mor D.O.   On: 11/09/2023 15:11   CT ABDOMEN PELVIS W CONTRAST Result Date: 11/05/2023 CLINICAL DATA:  Acute nonlocalized abdominal pain. Patient reports pain for 2 months, progressive. EXAM: CT ABDOMEN AND PELVIS WITH CONTRAST TECHNIQUE: Multidetector CT imaging of the abdomen and pelvis was performed using the standard protocol following bolus administration of intravenous contrast. RADIATION DOSE REDUCTION: This exam was performed according to the departmental dose-optimization program which includes automated exposure control, adjustment of the mA and/or kV according to patient size and/or use of iterative reconstruction technique. CONTRAST:  OMNIPAQUE IOHEXOL 350 MG/ML SOLN COMPARISON:  CT 08/21/2023, MRI 08/24/2023 FINDINGS: Lower chest: No basilar airspace disease or pleural effusion. Hepatobiliary: Multiple hypodense hepatic lesions, increased in number and size from prior imaging. Motion artifact limits detailed assessment. Largest lesion is in the inferior right hepatic lobe measuring 2.6 cm, series 2, image 35, not previously demonstrated on CT. Subcapsular lesion in the right lobe measures 19 mm, series 2, image 17, not previously demonstrated on CT. Lesion in the left lobe  measures 16 mm, series 2, image 18, not previously demonstrated on CT. Cholecystectomy without biliary dilatation. Pancreas: Hypodense lesion in the pancreatic tail measures 7.2 x 3.9 cm, previously 5 5 x 3.7 cm. No adjacent inflammation. There is no proximal pancreatic ductal dilatation, motion limited. Spleen: Hypodense lesion in the anterior spleen is new from prior imaging, 13 mm series 2, image 7. Adrenals/Urinary Tract: No adrenal nodule. No hydronephrosis or renal calculi. No suspicious renal lesion. Unremarkable urinary bladder. Stomach/Bowel: Laxity of the anterior abdominal wall with midline ventral abdominal wall hernia containing small and to a lesser extent large bowel. There is no bowel obstruction or inflammation. Moderate volume of stool in the colon. Scattered colonic diverticula without diverticulitis. The stomach is nondistended. Vascular/Lymphatic: Minor aortic atherosclerosis. No aortic aneurysm. No definite enlarged lymph nodes on this motion limited exam. Reproductive: Status post hysterectomy. No adnexal masses. Other: No ascites. Small fat containing periumbilical hernia. No definite omental thickening. Musculoskeletal: Advanced degenerative change throughout the spine. Advanced bilateral hip arthropathy. Advanced bilateral sacroiliac arthropathy. No evidence of focal bone lesion or acute osseous findings. IMPRESSION: 1. Multiple hypodense hepatic lesions, increased in number and size from prior imaging, highly suspicious for metastatic  disease. 2. Hypodense lesion in the pancreatic tail measures 7.2 x 3.9 cm, increased in size from prior. The increase in size favors pancreatic neoplasm, with additional etiologies less likely. 3. New hypodense lesion in the anterior spleen, 13 mm, suspicious for metastatic disease. 4. Laxity of the anterior abdominal wall with midline ventral abdominal wall hernia containing small and to a lesser extent large bowel. No bowel obstruction or inflammation. 5.  Colonic diverticulosis without diverticulitis. Aortic Atherosclerosis (ICD10-I70.0). Electronically Signed   By: Narda Rutherford M.D.   On: 11/05/2023 23:00

## 2023-11-17 NOTE — Assessment & Plan Note (Addendum)
 on Ozempic.  Recommend healthy diet, lifestyle modification

## 2023-11-17 NOTE — Progress Notes (Signed)
 START ON PATHWAY REGIMEN - Pancreatic Adenocarcinoma     A cycle is every 28 days:     Nab-paclitaxel (protein bound)      Gemcitabine   **Always confirm dose/schedule in your pharmacy ordering system**  Patient Characteristics: Metastatic Disease, First Line, PS = 0,1, BRCA1/2 and PALB2  Mutation Absent/Unknown Therapeutic Status: Metastatic Disease Line of Therapy: First Line ECOG Performance Status: 1 BRCA1/2 Mutation Status: Awaiting Test Results PALB2 Mutation Status: Awaiting Test Results Intent of Therapy: Non-Curative / Palliative Intent, Discussed with Patient

## 2023-11-17 NOTE — Addendum Note (Signed)
 Addended by: Rickard Patience on: 11/17/2023 09:03 PM   Modules accepted: Orders

## 2023-11-17 NOTE — Assessment & Plan Note (Signed)
 Recommend patient to continue OxyContin 10 mg every 12 hours, plus oxycodone 5 mg every 4-6 hours as needed

## 2023-11-17 NOTE — Assessment & Plan Note (Signed)
 Continue follow-up with primary care provider.  Last A1c 8.7.

## 2023-11-18 ENCOUNTER — Other Ambulatory Visit: Payer: Self-pay

## 2023-11-19 ENCOUNTER — Other Ambulatory Visit

## 2023-11-19 ENCOUNTER — Other Ambulatory Visit: Payer: Self-pay

## 2023-11-19 ENCOUNTER — Ambulatory Visit: Payer: 59 | Admitting: Gastroenterology

## 2023-11-19 NOTE — Progress Notes (Signed)
 Pharmacist Chemotherapy Monitoring - Initial Assessment    Anticipated start date: 11/27/23   The following has been reviewed per standard work regarding the patient's treatment regimen: The patient's diagnosis, treatment plan and drug doses, and organ/hematologic function Lab orders and baseline tests specific to treatment regimen  The treatment plan start date, drug sequencing, and pre-medications Prior authorization status  Patient's documented medication list, including drug-drug interaction screen and prescriptions for anti-emetics and supportive care specific to the treatment regimen The drug concentrations, fluid compatibility, administration routes, and timing of the medications to be used The patient's access for treatment and lifetime cumulative dose history, if applicable  The patient's medication allergies and previous infusion related reactions, if applicable   Changes made to treatment plan:  N/A  Follow up needed:  N/A   Sharen Hones, PharmD, BCPS Clinical Pharmacist   11/19/2023  2:30 PM

## 2023-11-20 ENCOUNTER — Inpatient Hospital Stay (HOSPITAL_BASED_OUTPATIENT_CLINIC_OR_DEPARTMENT_OTHER): Admitting: Hospice and Palliative Medicine

## 2023-11-20 DIAGNOSIS — C259 Malignant neoplasm of pancreas, unspecified: Secondary | ICD-10-CM

## 2023-11-20 DIAGNOSIS — K869 Disease of pancreas, unspecified: Secondary | ICD-10-CM

## 2023-11-20 DIAGNOSIS — G893 Neoplasm related pain (acute) (chronic): Secondary | ICD-10-CM

## 2023-11-20 DIAGNOSIS — Z515 Encounter for palliative care: Secondary | ICD-10-CM | POA: Diagnosis not present

## 2023-11-20 DIAGNOSIS — R933 Abnormal findings on diagnostic imaging of other parts of digestive tract: Secondary | ICD-10-CM

## 2023-11-20 MED ORDER — MORPHINE SULFATE ER 15 MG PO TBCR: 15.0000 mg | EXTENDED_RELEASE_TABLET | Freq: Two times a day (BID) | ORAL | 0 refills | Status: DC

## 2023-11-20 MED ORDER — NALOXONE HCL 4 MG/0.1ML NA LIQD: NASAL | 0 refills | Status: DC

## 2023-11-20 NOTE — Progress Notes (Signed)
 Virtual Visit via Telephone Note  I connected with Kristina Cruz on 11/20/23 at  3:00 PM EDT by telephone and verified that I am speaking with the correct person using two identifiers.  Location: Patient: Home Provider: Clinic   I discussed the limitations, risks, security and privacy concerns of performing an evaluation and management service by telephone and the availability of in person appointments. I also discussed with the patient that there may be a patient responsible charge related to this service. The patient expressed understanding and agreed to proceed.   History of Present Illness: Kristina Cruz is a 63 y.o. female with multiple medical problems including diabetes, hypertension, restless leg syndrome, and pancreatic mass diagnosed in January 2025 who was lost to follow-up.  Patient admitted 11/05/2023 with intractable abdominal pain.  CT revealed increased size of pancreatic tail mass with new hypodense lesion in the anterior spleen and multiple hepatic masses concerning for metastatic disease.  Palliative care consulted to address goals manage ongoing symptoms.    Observations/Objective: Patient seen in the hospital.  Had a telephone call with her today to assess symptoms.  Patient states her pain is much improved on OxyContin/oxycodone.  She is still utilizing oxycodone regularly for breakthrough pain.  However, patient states that she has not been able to have filled the recent prescription for OxyContin due to the cost.  Her pharmacy recommended rotating to MS Contin, which reportedly would be approved by her insurance.  Patient states that she is tolerating pain meds well.  Denies any adverse effects.  Has had occasional nausea but no vomiting.  Denies other symptomatic complaints or concerns today.  Patient has a workers at home that assist her with care.  She is trying to increase home resources.  Will consult social work to discuss.  Assessment and  Plan: Probable stage IV pancreatic cancer -pending initiation of Abraxane/gemcitabine.  Has chemo education on Monday.  Referrals to social work and [employer] and nutrition.  Neoplasm related pain -will rotate from OxyContin to MS Contin.  Continue oxycodone IR as needed for breakthrough pain.  Daily bowel regimen to prevent opioid-induced constipation.  PDMP reviewed.  Follow Up Instructions: Follow-up telephone visit 2 to 3 weeks   I discussed the assessment and treatment plan with the patient. The patient was provided an opportunity to ask questions and all were answered. The patient agreed with the plan and demonstrated an understanding of the instructions.   The patient was advised to call back or seek an in-person evaluation if the symptoms worsen or if the condition fails to improve as anticipated.  I provided 10 minutes of non-face-to-face time during this encounter.   Malachy Moan, NP

## 2023-11-23 ENCOUNTER — Inpatient Hospital Stay

## 2023-11-23 ENCOUNTER — Telehealth: Payer: Self-pay | Admitting: Oncology

## 2023-11-23 NOTE — Telephone Encounter (Signed)
 Patient called to have her DNR revoked. She stated that she was not in her right mind when she agreed to the document. After confirming identity, a [employer] help desk ticket was opened to void the document.

## 2023-11-24 ENCOUNTER — Inpatient Hospital Stay

## 2023-11-24 NOTE — Progress Notes (Signed)
 [facility] Clinical Social Work  Clinical Social Work was referred by medical provider, [employer], for assessment of psychosocial needs.  Clinical Social Worker contacted patient by phone to offer support and assess for needs.    Patient was very focused on having her DNR voided from her medical record.  Informed her that Francee Gentile in [facility] had put in a request to have it removed.  CSW provided active listening and supportive counseling.  CSW to meet with patient during her infusion on Friday, 4/11.     Dorothey Baseman, LCSW  Clinical Social Worker [facility]        Patient is participating in a Managed Medicaid Plan:  Yes

## 2023-11-25 ENCOUNTER — Ambulatory Visit

## 2023-11-25 ENCOUNTER — Encounter: Payer: Self-pay | Admitting: Oncology

## 2023-11-27 ENCOUNTER — Encounter: Payer: Self-pay | Admitting: Oncology

## 2023-11-27 ENCOUNTER — Inpatient Hospital Stay

## 2023-11-27 ENCOUNTER — Inpatient Hospital Stay: Admitting: Oncology

## 2023-11-27 VITALS — BP 150/81 | HR 69 | Temp 97.0°F | Resp 18

## 2023-11-27 VITALS — BP 108/71 | HR 82 | Temp 96.7°F | Resp 18 | Wt 269.2 lb

## 2023-11-27 DIAGNOSIS — C787 Secondary malignant neoplasm of liver and intrahepatic bile duct: Secondary | ICD-10-CM

## 2023-11-27 DIAGNOSIS — Z5111 Encounter for antineoplastic chemotherapy: Secondary | ICD-10-CM | POA: Diagnosis not present

## 2023-11-27 DIAGNOSIS — C259 Malignant neoplasm of pancreas, unspecified: Secondary | ICD-10-CM

## 2023-11-27 DIAGNOSIS — G893 Neoplasm related pain (acute) (chronic): Secondary | ICD-10-CM | POA: Diagnosis not present

## 2023-11-27 DIAGNOSIS — E1165 Type 2 diabetes mellitus with hyperglycemia: Secondary | ICD-10-CM

## 2023-11-27 LAB — CMP (CANCER CENTER ONLY)
ALT: 9 U/L (ref 0–44)
AST: 13 U/L — ABNORMAL LOW (ref 15–41)
Albumin: 4 g/dL (ref 3.5–5.0)
Alkaline Phosphatase: 85 U/L (ref 38–126)
Anion gap: 12 (ref 5–15)
BUN: 9 mg/dL (ref 8–23)
CO2: 28 mmol/L (ref 22–32)
Calcium: 9 mg/dL (ref 8.9–10.3)
Chloride: 97 mmol/L — ABNORMAL LOW (ref 98–111)
Creatinine: 0.84 mg/dL (ref 0.44–1.00)
GFR, Estimated: >60 mL/min (ref >60)
Glucose, Bld: 182 mg/dL — ABNORMAL HIGH (ref 70–99)
Potassium: 4 mmol/L (ref 3.5–5.1)
Sodium: 137 mmol/L (ref 135–145)
Total Bilirubin: 1 mg/dL (ref 0.0–1.2)
Total Protein: 7.6 g/dL (ref 6.5–8.1)

## 2023-11-27 LAB — CBC WITH DIFFERENTIAL (CANCER CENTER ONLY)
Abs Immature Granulocytes: 0.03 10*3/uL (ref 0.00–0.07)
Basophils Absolute: 0.1 10*3/uL (ref 0.0–0.1)
Basophils Relative: 1 %
Eosinophils Absolute: 0.4 10*3/uL (ref 0.0–0.5)
Eosinophils Relative: 5 %
HCT: 41.8 % (ref 36.0–46.0)
Hemoglobin: 13.3 g/dL (ref 12.0–15.0)
Immature Granulocytes: 0 %
Lymphocytes Relative: 21 %
Lymphs Abs: 1.5 10*3/uL (ref 0.7–4.0)
MCH: 27.4 pg (ref 26.0–34.0)
MCHC: 31.8 g/dL (ref 30.0–36.0)
MCV: 86 fL (ref 80.0–100.0)
Monocytes Absolute: 0.6 10*3/uL (ref 0.1–1.0)
Monocytes Relative: 8 %
Neutro Abs: 4.8 10*3/uL (ref 1.7–7.7)
Neutrophils Relative %: 65 %
Platelet Count: 241 10*3/uL (ref 150–400)
RBC: 4.86 MIL/uL (ref 3.87–5.11)
RDW: 14.7 % (ref 11.5–15.5)
WBC Count: 7.4 10*3/uL (ref 4.0–10.5)
nRBC: 0 % (ref 0.0–0.2)

## 2023-11-27 MED ORDER — PROCHLORPERAZINE MALEATE 10 MG PO TABS
10.0000 mg | ORAL_TABLET | Freq: Once | ORAL | Status: AC
  Administered 2023-11-27: 10 mg via ORAL
  Filled 2023-11-27: qty 1

## 2023-11-27 MED ORDER — SODIUM CHLORIDE 0.9 % IV SOLN
1000.0000 mg/m2 | Freq: Once | INTRAVENOUS | Status: AC
  Administered 2023-11-27: 2318 mg via INTRAVENOUS
  Filled 2023-11-27: qty 60.96

## 2023-11-27 MED ORDER — SODIUM CHLORIDE 0.9 % IV SOLN
INTRAVENOUS | Status: DC
  Filled 2023-11-27 (×2): qty 250

## 2023-11-27 MED ORDER — SODIUM CHLORIDE 0.9 % IV SOLN
INTRAVENOUS | Status: DC
  Filled 2023-11-27: qty 250

## 2023-11-27 MED ORDER — PACLITAXEL PROTEIN-BOUND CHEMO INJECTION 100 MG
125.0000 mg/m2 | Freq: Once | INTRAVENOUS | Status: AC
  Administered 2023-11-27: 300 mg via INTRAVENOUS
  Filled 2023-11-27: qty 60

## 2023-11-27 NOTE — Assessment & Plan Note (Signed)
 Recommend patient to continue OxyContin 10 mg every 12 hours, plus oxycodone 5 mg every 4-6 hours as needed

## 2023-11-27 NOTE — Progress Notes (Signed)
 Hematology/Oncology Progress note [phone] [phone]      CHIEF COMPLAINTS/PURPOSE OF CONSULTATION:  Stage IV pancreatic carcinoma with liver metastasis.  ASSESSMENT & PLAN:   Pancreatic cancer metastasized to liver [facility]) Imaging findings and different lesion biopsy results were reviewed and discussed with patient. 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  Other differential diagnosis include GI tract adenocarcinoma, cholangiocarcinoma etc. CEA is elevated.  His CA 19-9 is normal. CT and MRI imaging is did not show any GI primary.I recommend a PET scan for further evaluation.  I recommend palliative chemotherapy with gemcitabine Abraxane. Labs are reviewed and discussed with patient. Proceed with cycle 1 D1 Gemcitabine/Abraxane Refer to IR for medi port  Neoplasm related pain Recommend patient to continue OxyContin 10 mg every 12 hours, plus oxycodone 5 mg every 4-6 hours as needed  Uncontrolled type 2 diabetes mellitus with hyperglycemia, without long-term current use of insulin (HCC) Continue follow-up with primary care provider.  Last A1c 8.7.  Encounter for antineoplastic chemotherapy Chemotherapy plan as listed above   Orders Placed This Encounter  Procedures   IR IMAGING GUIDED PORT INSERTION    Standing Status:   Future    Expected Date:   12/04/2023    Expiration Date:   11/26/2024    Reason for Exam (SYMPTOM  OR DIAGNOSIS REQUIRED):   port placement form chemo    Preferred Imaging Location?:   [facility]   Follow-up in 1 week to start chemotherapy. All questions were answered. The patient knows to call the clinic with any problems, questions or concerns.  Rickard Patience, MD, PhD [facility] Hematology Oncology 11/27/2023    HISTORY OF PRESENTING ILLNESS:  Kristina Cruz 63 y.o. female presents to establish care for  I have reviewed her chart and materials related to her cancer  extensively and collaborated history with the patient. Summary of oncologic history is as follows: Oncology History  Pancreatic cancer metastasized to liver (HCC)  08/21/2023 Imaging   CT abdomen pelvis with contrast showed 1. Masslike thickening involving the proximal and mid tail of pancreas with relative hypoenhancement measures approximately 3.9 x 5.5 cm. In the absence of signs/symptoms of acute pancreatitis underlying neoplastic process cannot be excluded. Further evaluation with nonemergent contrast enhanced MRI of the abdomen is advised. Note: Given patient's body habitus and the motion artifact observed on the current exam an MRI obtained at this time is likely to be severely limited and likely nondiagnostic. MRI should be obtained only once the patient is clinically stable, and is able to remain motionless and breath hold. 2. Patchy ground-glass and airspace densities identified within both lower lobes. These are new compared with the previous exam and are concerning for underlying inflammatory/infectious process. 3. Sigmoid diverticulosis without signs of acute diverticulitis. 4. Marked diastasis recti with ventral herniation of the large and small bowel loops. 5. Periumbilical hernia is containing fat only.   08/24/2023 Imaging   MRI abdomen wo contrast  1. Moderately suboptimal unenhanced exam. 2. Heterogeneous masslike thickening of the pancreatic body/tail again seen. The lesion remains indeterminate on this exam. Differential diagnosis includes pancreatic tumor, autoimmune pancreatitis, mass forming chronic pancreatitis, etc. Please see above for follow-up recommendations. 3. Mild diffuse hepatic steatosis. There are indeterminate areas in the liver, which can also be better evaluated on the contrast-enhanced MRI abdomen. 4. Other observations, as described above.   11/05/2023 Initial Diagnosis   Pancreatic cancer metastasized to liver  Patient was hospitalized from  11/05/2023 -  11/10/2023 She presented to emergency room due to nausea, vomiting and abdominal pain. CT scan showed multiple hypodense hepatic lesions increasing number and size from prior imaging.  Pancreatic tail mass, hypodense lesion in the anterior spleen.  11/09/2023 ultrasound-guided biopsy was obtained. Pathology showed moderately to poorly differentiated adenocarcinoma with extensive areas of tumor necrosis. The adenocarcinoma is positive for cytokeratin 7 and CDX2 (focal).     Cytokeratin 20, TTF-1, and GATA3 are negative. The pattern of immunoreactivity is not specific and the differential diagnosis includes metastatic adenocarcinoma from the pancreaticobiliary tract and upper GI. Intrahepatic       cholangiocarcinoma is also a diagnostic possibility. Given the radiographic  findings a pancreaticobiliary primary is favored and correlation with clinical impression is required.    11/17/2023 Cancer Staging   Staging form: Exocrine Pancreas, AJCC 8th Edition - Clinical stage from 11/17/2023: Stage IV (cT3, cNX, pM1) - Signed by Rickard Patience, MD on 11/17/2023 Stage prefix: Initial diagnosis    Imaging   CT abdomen pelvis w contrast  1. Multiple hypodense hepatic lesions, increased in number and size from prior imaging, highly suspicious for metastatic disease. 2. Hypodense lesion in the pancreatic tail measures 7.2 x 3.9 cm, increased in size from prior. The increase in size favors pancreatic neoplasm, with additional etiologies less likely. 3. New hypodense lesion in the anterior spleen, 13 mm, suspicious for metastatic disease. 4. Laxity of the anterior abdominal wall with midline ventral abdominal wall hernia containing small and to a lesser extent large bowel. No bowel obstruction or inflammation. 5. Colonic diverticulosis without diverticulitis.   Aortic Atherosclerosis (ICD10-I70.0)    11/27/2023 -  Chemotherapy   Patient is on Treatment Plan : PANCREATIC Abraxane D1,8,15 + Gemcitabine  D1,8,15 q28d      Patient would like to get a medi port.  She presents for evaluation prior to chemo.  NO new complaints. Pain is controlled.    MEDICAL HISTORY:  Past Medical History:  Diagnosis Date   Anemia    Asthma    Back pain    Cataract    Diabetes mellitus without complication (HCC)    GERD (gastroesophageal reflux disease)    Hypertension    Morbid obesity with BMI of 60.0-69.9, adult (HCC)    Restless leg syndrome     SURGICAL HISTORY: Past Surgical History:  Procedure Laterality Date   ABDOMINAL HYSTERECTOMY     CATARACT EXTRACTION W/PHACO Left 04/13/2020   Procedure: CATARACT EXTRACTION PHACO AND INTRAOCULAR LENS PLACEMENT (IOC);  Surgeon: Elliot Cousin, MD;  Location: [facility] ORS;  Service: Ophthalmology;  Laterality: Left;  [place] 00:36.0 CDE 5.95 Fluid Pack lot # [id number]   HYSTEROSCOPY WITH D & C N/A 07/14/2017   Procedure: DILATATION AND CURETTAGE /HYSTEROSCOPY;  Surgeon: Nadara Mustard, MD;  Location: [facility] ORS;  Service: Gynecology;  Laterality: N/A;   POLYPECTOMY  2015    SOCIAL HISTORY: Social History   Socioeconomic History   Marital status: Single    Spouse name: Not on file   Number of children: Not on file   Years of education: Not on file   Highest education level: Not on file  Occupational History   Not on file  Tobacco Use   Smoking status: Never   Smokeless tobacco: Never  Vaping Use   Vaping status: Never Used  Substance and Sexual Activity   Alcohol use: No   Drug use: No   Sexual activity: Never    Birth control/protection: None  Other Topics Concern   Not  on file  Social History Narrative   Not on file   Social Drivers of Health   Financial Resource Strain: Low Risk  (09/26/2022)   [facility] from [facility]   Overall Financial Resource Strain (CARDIA)    Difficulty of Paying Living Expenses: Not hard at all  Food Insecurity: No Food Insecurity (11/06/2023)   Hunger Vital Sign     Worried About Running Out of Food in the Last Year: Never true    Ran Out of Food in the Last Year: Never true  Transportation Needs: No Transportation Needs (11/06/2023)   PRAPARE - Administrator, Civil Service (Medical): No    Lack of Transportation (Non-Medical): No  Recent Concern: Transportation Needs - Unmet Transportation Needs (09/28/2023)   PRAPARE - Administrator, Civil Service (Medical): Yes    Lack of Transportation (Non-Medical): Yes  Physical Activity: Not on file  Stress: Not on file  Social Connections: Socially Isolated (11/06/2023)   Social Connection and Isolation Panel [NHANES]    Frequency of Communication with Friends and Family: More than three times a week    Frequency of Social Gatherings with Friends and Family: More than three times a week    Attends Religious Services: Never    Database administrator or Organizations: No    Attends Banker Meetings: Never    Marital Status: Never married  Intimate Partner Violence: Not At Risk (11/06/2023)   Humiliation, Afraid, Rape, and Kick questionnaire    Fear of Current or Ex-Partner: No    Emotionally Abused: No    Physically Abused: No    Sexually Abused: No    FAMILY HISTORY: Family History  Problem Relation Age of Onset   Breast cancer Mother 57   Diabetes Mother    Hypertension Mother    Ovarian cancer Paternal Aunt        ?   Diabetes Father    Hypertension Father     ALLERGIES:  is allergic to peanut-containing drug products.  MEDICATIONS:  Current Outpatient Medications  Medication Sig Dispense Refill   acetaminophen (TYLENOL 8 HOUR ARTHRITIS PAIN) 650 MG CR tablet Take 3 tablets (1,950 mg total) by mouth every 8 (eight) hours as needed for pain. Do not exceed 4000 mg total in a day.  Home med.     amLODipine (NORVASC) 10 MG tablet Take 1 tablet (10 mg total) by mouth daily. 30 tablet 0   azelastine (ASTELIN) 0.1 % nasal spray Place 1 spray into both nostrils  2 (two) times daily.     EPINEPHrine 0.3 mg/0.3 mL IJ SOAJ injection Inject 0.3 mg into the muscle as needed for anaphylaxis. 1 each 0   fluticasone (FLONASE) 50 MCG/ACT nasal spray Place 1 spray into both nostrils daily. 9.9 mL 0   fluticasone furoate-vilanterol (BREO ELLIPTA) 100-25 MCG/ACT AEPB Inhale 1 puff into the lungs daily. 28 each 1   furosemide (LASIX) 40 MG tablet Take 40 mg by mouth every other day.     gabapentin (NEURONTIN) 300 MG capsule Take 600 mg by mouth 2 (two) times daily.     ipratropium-albuterol (DUONEB) 0.5-2.5 (3) MG/3ML SOLN Take 3 mLs by nebulization every 4 (four) hours as needed (wheezing / shortness of breath). 360 mL 1   levocetirizine (XYZAL) 5 MG tablet Take 5 mg by mouth daily.     metFORMIN (GLUCOPHAGE) 1000 MG tablet Take 1,000 mg by mouth 2 (two) times daily.  morphine (MS CONTIN) 15 MG 12 hr tablet Take 1 tablet (15 mg total) by mouth every 12 (twelve) hours. 30 tablet 0   naloxone (NARCAN) nasal spray 4 mg/0.1 mL SPRAY 1 SPRAY INTO ONE NOSTRIL AS DIRECTED FOR OPIOID OVERDOSE (TURN PERSON ON SIDE AFTER DOSE. IF NO RESPONSE IN 2-3 MINUTES OR PERSON RESPONDS BUT RELAPSES, REPEAT USING A NEW SPRAY DEVICE AND SPRAY INTO THE OTHER NOSTRIL. CALL 911 AFTER USE.) * EMERGENCY USE ONLY * 1 each 0   ondansetron (ZOFRAN) 8 MG tablet Take 1 tablet (8 mg total) by mouth every 8 (eight) hours as needed for nausea or vomiting. 30 tablet 1   oxyCODONE (OXY IR/ROXICODONE) 5 MG immediate release tablet Take 1 tablet (5 mg total) by mouth every 4 (four) hours as needed for moderate pain (pain score 4-6) or severe pain (pain score 7-10). 30 tablet 0   prochlorperazine (COMPAZINE) 10 MG tablet Take 1 tablet (10 mg total) by mouth every 6 (six) hours as needed for nausea or vomiting. 30 tablet 1   rOPINIRole (REQUIP) 2 MG tablet Take 2 mg by mouth in the morning and at bedtime.      Semaglutide, 1 MG/DOSE, 4 MG/3ML SOPN Inject 1 mg into the skin once a week.     senna-docusate  (SENOKOT-S) 8.6-50 MG tablet Take 1 tablet by mouth daily. 30 tablet 0   VENTOLIN HFA 108 (90 Base) MCG/ACT inhaler Inhale 2 puffs into the lungs every 4 (four) hours as needed for wheezing or shortness of breath. 18 g 1   No current facility-administered medications for this visit.    Review of Systems  Constitutional:  Positive for fatigue. Negative for appetite change, chills and fever.  HENT:   Negative for hearing loss and voice change.   Eyes:  Negative for eye problems.  Respiratory:  Negative for chest tightness and cough.   Cardiovascular:  Negative for chest pain.  Gastrointestinal:  Positive for abdominal pain. Negative for abdominal distention and blood in stool.  Endocrine: Negative for hot flashes.  Genitourinary:  Negative for difficulty urinating and frequency.   Musculoskeletal:  Negative for arthralgias.  Skin:  Negative for itching and rash.  Neurological:  Negative for extremity weakness.  Hematological:  Negative for adenopathy.  Psychiatric/Behavioral:  Negative for confusion.      PHYSICAL EXAMINATION: ECOG PERFORMANCE STATUS: 1 - Symptomatic but completely ambulatory  Vitals:   11/27/23 1108  BP: 108/71  Pulse: 82  Resp: 18  Temp: (!) 96.7 F (35.9 C)  SpO2: 96%   Filed Weights   11/27/23 1108  Weight: 269 lb 3.2 oz (122.1 kg)    Physical Exam Constitutional:      General: She is not in acute distress.    Appearance: She is obese. She is not diaphoretic.  HENT:     Head: Normocephalic and atraumatic.  Eyes:     General: No scleral icterus. Cardiovascular:     Rate and Rhythm: Normal rate and regular rhythm.     Heart sounds: No murmur heard. Pulmonary:     Effort: Pulmonary effort is normal. No respiratory distress.     Breath sounds: No wheezing.     Comments: Decreased breath sound bilaterally Abdominal:     General: Bowel sounds are normal. There is no distension.     Palpations: Abdomen is soft.  Musculoskeletal:        General:  Normal range of motion.     Cervical back: Normal range of motion and  neck supple.  Skin:    General: Skin is warm and dry.     Findings: No erythema.  Neurological:     Mental Status: She is alert and oriented to person, place, and time. Mental status is at baseline.     Motor: No abnormal muscle tone.  Psychiatric:        Mood and Affect: Mood and affect normal.      LABORATORY DATA:  I have reviewed the data as listed    Latest Ref Rng & Units 11/27/2023   10:53 AM 11/09/2023    5:03 AM 11/06/2023    4:49 AM  CBC  WBC 4.0 - 10.5 K/uL 7.4  8.1  8.8   Hemoglobin 12.0 - 15.0 g/dL 16.1  09.6  04.5   Hematocrit 36.0 - 46.0 % 41.8  38.8  40.5   Platelets 150 - 400 K/uL 241  248  280       Latest Ref Rng & Units 11/27/2023   10:53 AM 11/06/2023    4:49 AM 11/05/2023    3:41 PM  CMP  Glucose 70 - 99 mg/dL 409  811  914   BUN 8 - 23 mg/dL 9  11  10    Creatinine 0.44 - 1.00 mg/dL 7.82  9.56  2.13   Sodium 135 - 145 mmol/L 137  138  135   Potassium 3.5 - 5.1 mmol/L 4.0  3.7  3.9   Chloride 98 - 111 mmol/L 97  100  99   CO2 22 - 32 mmol/L 28  30  29    Calcium 8.9 - 10.3 mg/dL 9.0  9.1  9.0   Total Protein 6.5 - 8.1 g/dL 7.6  7.2  7.8   Total Bilirubin 0.0 - 1.2 mg/dL 1.0  0.8  0.9   Alkaline Phos 38 - 126 U/L 85  77  80   AST 15 - 41 U/L 13  13  12    ALT 0 - 44 U/L 9  10  12       RADIOGRAPHIC STUDIES: I have personally reviewed the radiological images as listed and agreed with the findings in the report. US BIOPSY (LIVER) Result Date: 11/09/2023 INDICATION: 63 year old female presents for biopsy of liver lesion EXAM: ULTRASOUND-GUIDED LIVER MASS BIOPSY MEDICATIONS: None. ANESTHESIA/SEDATION: Moderate (conscious) sedation was employed during this procedure. A total of Versed 1.0 mg and Fentanyl 50 mcg was administered intravenously by the radiology nurse. Total intra-service moderate Sedation Time: 10 minutes. The patient's level of consciousness and vital signs were monitored  continuously by radiology nursing throughout the procedure under my direct supervision. COMPLICATIONS: None PROCEDURE: Informed written consent was obtained from the patient after a thorough discussion of the procedural risks, benefits and alternatives. All questions were addressed. Maximal Sterile Barrier Technique was utilized including caps, mask, sterile gowns, sterile gloves, sterile drape, hand hygiene and skin antiseptic. A timeout was performed prior to the initiation of the procedure. Ultrasound survey of the right liver lobe performed with images stored and sent to PACs. The right lower thorax/right upper abdomen was prepped with chlorhexidine in a sterile fashion, and a sterile drape was applied covering the operative field. A sterile gown and sterile gloves were used for the procedure. Local anesthesia was provided with 1% Lidocaine. The patient was prepped and draped sterilely and the skin and subcutaneous tissues were generously infiltrated with 1% lidocaine. A 17 gauge introducer needle was then advanced under ultrasound guidance in sub costal location into the right liver lobe, targeting  hypoechoic mass of the right liver. The stylet was removed, and multiple separate 18 gauge core biopsy were retrieved. Samples were placed into formalin for transportation to the lab. Gel-Foam slurry then infused with a small amount of saline for assistance with hemostasis. The needle was removed, and a final ultrasound image was performed. The patient tolerated the procedure well and remained hemodynamically stable throughout. No complications were encountered and no significant blood loss was encounter. IMPRESSION: Status post ultrasound-guided liver mass biopsy Signed, Yvone Neu. Miachel Roux, RPVI [facility] Electronically Signed   By: Gilmer Mor D.O.   On: 11/09/2023 15:11   CT ABDOMEN PELVIS W CONTRAST Result Date: 11/05/2023 CLINICAL DATA:   Acute nonlocalized abdominal pain. Patient reports pain for 2 months, progressive. EXAM: CT ABDOMEN AND PELVIS WITH CONTRAST TECHNIQUE: Multidetector CT imaging of the abdomen and pelvis was performed using the standard protocol following bolus administration of intravenous contrast. RADIATION DOSE REDUCTION: This exam was performed according to the departmental dose-optimization program which includes automated exposure control, adjustment of the mA and/or kV according to patient size and/or use of iterative reconstruction technique. CONTRAST:  OMNIPAQUE IOHEXOL 350 MG/ML SOLN COMPARISON:  CT 08/21/2023, MRI 08/24/2023 FINDINGS: Lower chest: No basilar airspace disease or pleural effusion. Hepatobiliary: Multiple hypodense hepatic lesions, increased in number and size from prior imaging. Motion artifact limits detailed assessment. Largest lesion is in the inferior right hepatic lobe measuring 2.6 cm, series 2, image 35, not previously demonstrated on CT. Subcapsular lesion in the right lobe measures 19 mm, series 2, image 17, not previously demonstrated on CT. Lesion in the left lobe measures 16 mm, series 2, image 18, not previously demonstrated on CT. Cholecystectomy without biliary dilatation. Pancreas: Hypodense lesion in the pancreatic tail measures 7.2 x 3.9 cm, previously 5 5 x 3.7 cm. No adjacent inflammation. There is no proximal pancreatic ductal dilatation, motion limited. Spleen: Hypodense lesion in the anterior spleen is new from prior imaging, 13 mm series 2, image 7. Adrenals/Urinary Tract: No adrenal nodule. No hydronephrosis or renal calculi. No suspicious renal lesion. Unremarkable urinary bladder. Stomach/Bowel: Laxity of the anterior abdominal wall with midline ventral abdominal wall hernia containing small and to a lesser extent large bowel. There is no bowel obstruction or inflammation. Moderate volume of stool in the colon. Scattered colonic diverticula without diverticulitis. The  stomach is nondistended. Vascular/Lymphatic: Minor aortic atherosclerosis. No aortic aneurysm. No definite enlarged lymph nodes on this motion limited exam. Reproductive: Status post hysterectomy. No adnexal masses. Other: No ascites. Small fat containing periumbilical hernia. No definite omental thickening. Musculoskeletal: Advanced degenerative change throughout the spine. Advanced bilateral hip arthropathy. Advanced bilateral sacroiliac arthropathy. No evidence of focal bone lesion or acute osseous findings. IMPRESSION: 1. Multiple hypodense hepatic lesions, increased in number and size from prior imaging, highly suspicious for metastatic disease. 2. Hypodense lesion in the pancreatic tail measures 7.2 x 3.9 cm, increased in size from prior. The increase in size favors pancreatic neoplasm, with additional etiologies less likely. 3. New hypodense lesion in the anterior spleen, 13 mm, suspicious for metastatic disease. 4. Laxity of the anterior abdominal wall with midline ventral abdominal wall hernia containing small and to a lesser extent large bowel. No bowel obstruction or inflammation. 5. Colonic diverticulosis without diverticulitis. Aortic Atherosclerosis (ICD10-I70.0). Electronically Signed   By: Narda Rutherford M.D.   On: 11/05/2023 23:00

## 2023-11-27 NOTE — Assessment & Plan Note (Signed)
 Continue follow-up with primary care provider.  Last A1c 8.7.

## 2023-11-27 NOTE — Assessment & Plan Note (Addendum)
 Imaging findings and different lesion biopsy results were reviewed and discussed with patient. 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  Other differential diagnosis include GI tract adenocarcinoma, cholangiocarcinoma etc. CEA is elevated.  His CA 19-9 is normal. CT and MRI imaging is did not show any GI primary.I recommend a PET scan for further evaluation.  I recommend palliative chemotherapy with gemcitabine Abraxane. Labs are reviewed and discussed with patient. Proceed with cycle 1 D1 Gemcitabine/Abraxane Refer to IR for medi port

## 2023-11-27 NOTE — Patient Instructions (Signed)
 [facility] - A DEPT OF [facility]  Discharge Instructions: Thank you for choosing [facility] to provide your oncology and hematology care.  If you have a lab appointment with the Cancer Center, please go directly to the Cancer Center and check in at the registration area.  Wear comfortable clothing and clothing appropriate for easy access to any Portacath or PICC line.   We strive to give you quality time with your provider. You may need to reschedule your appointment if you arrive late (15 or more minutes).  Arriving late affects you and other patients whose appointments are after yours.  Also, if you miss three or more appointments without notifying the office, you may be dismissed from the clinic at the provider's discretion.      For prescription refill requests, have your pharmacy contact our office and allow 72 hours for refills to be completed.    Today you received the following chemotherapy and/or immunotherapy agents Abraxane and Gemzar   To help prevent nausea and vomiting after your treatment, we encourage you to take your nausea medication as directed.  BELOW ARE SYMPTOMS THAT SHOULD BE REPORTED IMMEDIATELY: *FEVER GREATER THAN 100.4 F (38 C) OR HIGHER *CHILLS OR SWEATING *NAUSEA AND VOMITING THAT IS NOT CONTROLLED WITH YOUR NAUSEA MEDICATION *UNUSUAL SHORTNESS OF BREATH *UNUSUAL BRUISING OR BLEEDING *URINARY PROBLEMS (pain or burning when urinating, or frequent urination) *BOWEL PROBLEMS (unusual diarrhea, constipation, pain near the anus) TENDERNESS IN MOUTH AND THROAT WITH OR WITHOUT PRESENCE OF ULCERS (sore throat, sores in mouth, or a toothache) UNUSUAL RASH, SWELLING OR PAIN  UNUSUAL VAGINAL DISCHARGE OR ITCHING   Items with * indicate a potential emergency and should be followed up as soon as possible or go to the Emergency Department if any problems should occur.  Please show the CHEMOTHERAPY ALERT CARD or  IMMUNOTHERAPY ALERT CARD at check-in to the Emergency Department and triage nurse.  Should you have questions after your visit or need to cancel or reschedule your appointment, please contact [facility] - A DEPT OF [facility]  [phone] and follow the prompts.  Office hours are 8:00 a.m. to 4:30 p.m. Monday - Friday. Please note that voicemails left after 4:00 p.m. may not be returned until the following business day.  We are closed weekends and major holidays. You have access to a nurse at all times for urgent questions. Please call the main number to the clinic [phone] and follow the prompts.  For any non-urgent questions, you may also contact your provider using MyChart. We now offer e-Visits for anyone 28 and older to request care online for non-urgent symptoms. For details visit [email or web address].   Also download the MyChart app! Go to the app store, search "MyChart", open the app, select , and log in with your MyChart username and password.

## 2023-11-27 NOTE — Assessment & Plan Note (Signed)
 Chemotherapy plan as listed above

## 2023-11-27 NOTE — Progress Notes (Signed)
 Nutrition Assessment   Reason for Assessment:  Pancreatic cancer   ASSESSMENT:  63 year old female with pancreatic cancer metastasized to liver.  Past medical history of DM, HTN, morbid obesity.  Patient starting abraxane and gemcitabine today.  Met with patient during infusion.  Reports that her appetite is good/normal.  Says that her pain is being controlled with medications.  Having regular bowel movements daily.  Says that she drinks water and juice mostly during the day.  Says that she does not really eat much breakfast.  Says that she sometimes will eat green beans, potato, fish, chicken mid day.  "It all depends on how I feel."  Can't really tell this writer what she has for evening meal.  "It all depends."  Says that she has a caregiver 20 hours per week that helps her prepare meals.  Daughter helps if caregiver is not there.  Daughter recently purchased premier protein shakes for patient to try.  She is planning on trying them tomorrow.      Nutrition Focused Physical Exam:   deferred   Medications: ozempic, metformin, lasxi, compazine, senokot   Labs: glucose 182   Anthropometrics:   Height: 62 inches Weight: 269 lb 8.2 oz today 305 lb on 02/10/24 BMI: 49  12% weight loss in the last 10 months (?? Ozempic)   NUTRITION DIAGNOSIS: Food and nutrition related knowledge deficit related to new diagnosis of pancreatic cancer as evidenced by evaluation by RD    INTERVENTION:  Discussed nutrition during cancer treatment.  Handout provided Encouraged foods rich in protein (lean sources) Encouraged patient to try premier protein shake.   Contact information provided to patient   MONITORING, EVALUATION, GOAL: weight trends, intake   Next Visit: Friday, April 25 during infusion  Yosgar Demirjian B. Elease Hashimoto, CSO, LDN Registered Dietitian [phone]

## 2023-11-27 NOTE — Progress Notes (Signed)
 Patient tolerated chemotherapy with no complaints voiced.  Side effects with management reviewed with understanding verbalized.  IV site clean and dry with no bruising or swelling noted at site.  Good blood return noted before and after administration of chemotherapy.  Gauze dressing applied.  Patient left in satisfactory condition with VSS and no s/s of distress noted. All follow ups as scheduled.   Kristina Cruz [employer]

## 2023-11-30 ENCOUNTER — Other Ambulatory Visit: Payer: Self-pay

## 2023-11-30 ENCOUNTER — Telehealth: Payer: Self-pay

## 2023-11-30 NOTE — Telephone Encounter (Signed)
 Telephone call to patient for follow up after receiving first infusion.   Patient states infusion went great.  States eating fair and drinking plenty of fluids.   Denies any nausea or vomiting.  Encouraged patient to call for any concerns or questions.

## 2023-12-02 ENCOUNTER — Encounter: Admission: RE | Admit: 2023-12-02 | Source: Ambulatory Visit

## 2023-12-02 ENCOUNTER — Telehealth: Payer: Self-pay

## 2023-12-02 NOTE — Telephone Encounter (Signed)
 Request for port placement sent to IR.

## 2023-12-04 ENCOUNTER — Inpatient Hospital Stay (HOSPITAL_BASED_OUTPATIENT_CLINIC_OR_DEPARTMENT_OTHER): Admitting: Oncology

## 2023-12-04 ENCOUNTER — Encounter: Payer: Self-pay | Admitting: Oncology

## 2023-12-04 ENCOUNTER — Inpatient Hospital Stay

## 2023-12-04 VITALS — BP 121/74 | HR 69 | Temp 97.2°F | Resp 18 | Wt 263.0 lb

## 2023-12-04 VITALS — BP 128/70 | HR 63

## 2023-12-04 DIAGNOSIS — C259 Malignant neoplasm of pancreas, unspecified: Secondary | ICD-10-CM

## 2023-12-04 DIAGNOSIS — Z5111 Encounter for antineoplastic chemotherapy: Secondary | ICD-10-CM | POA: Diagnosis not present

## 2023-12-04 DIAGNOSIS — G893 Neoplasm related pain (acute) (chronic): Secondary | ICD-10-CM | POA: Diagnosis not present

## 2023-12-04 DIAGNOSIS — E1165 Type 2 diabetes mellitus with hyperglycemia: Secondary | ICD-10-CM | POA: Diagnosis not present

## 2023-12-04 DIAGNOSIS — C787 Secondary malignant neoplasm of liver and intrahepatic bile duct: Secondary | ICD-10-CM | POA: Diagnosis not present

## 2023-12-04 DIAGNOSIS — R52 Pain, unspecified: Secondary | ICD-10-CM

## 2023-12-04 LAB — CMP (CANCER CENTER ONLY)
ALT: 12 U/L (ref 0–44)
AST: 15 U/L (ref 15–41)
Albumin: 3.5 g/dL (ref 3.5–5.0)
Alkaline Phosphatase: 77 U/L (ref 38–126)
Anion gap: 12 (ref 5–15)
BUN: 8 mg/dL (ref 8–23)
CO2: 26 mmol/L (ref 22–32)
Calcium: 8.8 mg/dL — ABNORMAL LOW (ref 8.9–10.3)
Chloride: 100 mmol/L (ref 98–111)
Creatinine: 0.92 mg/dL (ref 0.44–1.00)
GFR, Estimated: >60 mL/min (ref >60)
Glucose, Bld: 188 mg/dL — ABNORMAL HIGH (ref 70–99)
Potassium: 3.5 mmol/L (ref 3.5–5.1)
Sodium: 138 mmol/L (ref 135–145)
Total Bilirubin: 0.7 mg/dL (ref 0.0–1.2)
Total Protein: 7.2 g/dL (ref 6.5–8.1)

## 2023-12-04 LAB — CBC WITH DIFFERENTIAL (CANCER CENTER ONLY)
Abs Immature Granulocytes: 0.01 10*3/uL (ref 0.00–0.07)
Basophils Absolute: 0 10*3/uL (ref 0.0–0.1)
Basophils Relative: 0 %
Eosinophils Absolute: 0.2 10*3/uL (ref 0.0–0.5)
Eosinophils Relative: 4 %
HCT: 37.2 % (ref 36.0–46.0)
Hemoglobin: 12 g/dL (ref 12.0–15.0)
Immature Granulocytes: 0 %
Lymphocytes Relative: 29 %
Lymphs Abs: 1.4 10*3/uL (ref 0.7–4.0)
MCH: 27.8 pg (ref 26.0–34.0)
MCHC: 32.3 g/dL (ref 30.0–36.0)
MCV: 86.1 fL (ref 80.0–100.0)
Monocytes Absolute: 0.3 10*3/uL (ref 0.1–1.0)
Monocytes Relative: 5 %
Neutro Abs: 3 10*3/uL (ref 1.7–7.7)
Neutrophils Relative %: 62 %
Platelet Count: 143 10*3/uL — ABNORMAL LOW (ref 150–400)
RBC: 4.32 MIL/uL (ref 3.87–5.11)
RDW: 14.1 % (ref 11.5–15.5)
WBC Count: 4.9 10*3/uL (ref 4.0–10.5)
nRBC: 0 % (ref 0.0–0.2)

## 2023-12-04 MED ORDER — SODIUM CHLORIDE 0.9 % IV SOLN
INTRAVENOUS | Status: DC
  Filled 2023-12-04: qty 250

## 2023-12-04 MED ORDER — SODIUM CHLORIDE 0.9 % IV SOLN
1000.0000 mg/m2 | Freq: Once | INTRAVENOUS | Status: AC
  Administered 2023-12-04: 2318 mg via INTRAVENOUS
  Filled 2023-12-04: qty 60.96

## 2023-12-04 MED ORDER — PROCHLORPERAZINE MALEATE 10 MG PO TABS
10.0000 mg | ORAL_TABLET | Freq: Once | ORAL | Status: AC
  Administered 2023-12-04: 10 mg via ORAL
  Filled 2023-12-04: qty 1

## 2023-12-04 MED ORDER — OXYCODONE HCL 5 MG PO TABS
5.0000 mg | ORAL_TABLET | Freq: Once | ORAL | Status: AC
  Administered 2023-12-04: 5 mg via ORAL
  Filled 2023-12-04: qty 1

## 2023-12-04 MED ORDER — OXYCODONE HCL 5 MG PO TABS
5.0000 mg | ORAL_TABLET | ORAL | 0 refills | Status: DC | PRN
Start: 2023-12-04 — End: 2024-03-16

## 2023-12-04 MED ORDER — PACLITAXEL PROTEIN-BOUND CHEMO INJECTION 100 MG
125.0000 mg/m2 | Freq: Once | INTRAVENOUS | Status: AC
  Administered 2023-12-04: 300 mg via INTRAVENOUS
  Filled 2023-12-04: qty 60

## 2023-12-04 NOTE — Assessment & Plan Note (Signed)
 Recommend patient to continue OxyContin  10 mg every 12 hours, plus oxycodone  5 mg every 4-6 hours as needed - refilled.  She forgot to take her pain med today and got a dose of oxycodone  5mg  today at infusion center.

## 2023-12-04 NOTE — Assessment & Plan Note (Addendum)
 Imaging findings and different lesion biopsy results were reviewed and discussed with patient. 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  Other differential diagnosis include GI tract adenocarcinoma, cholangiocarcinoma etc. CEA is elevated.  His CA 19-9 is normal. CT and MRI imaging is did not show any GI primary.I recommend a PET scan for further evaluation- patient rescheduled appt.   Labs are reviewed and discussed with patient. Proceed with cycle 1 D8 Gemcitabine /Abraxane 

## 2023-12-04 NOTE — Telephone Encounter (Signed)
 Pt request for this to be done the week of 4/28. Message sent to IR.

## 2023-12-04 NOTE — Assessment & Plan Note (Signed)
 Chemotherapy plan as listed above

## 2023-12-04 NOTE — Assessment & Plan Note (Signed)
 Continue follow-up with primary care provider.  Last A1c 8.7.

## 2023-12-04 NOTE — Progress Notes (Signed)
 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  ASSESSMENT & PLAN:   Pancreatic cancer metastasized to liver [facility]) Imaging findings and different lesion biopsy results were reviewed and discussed with patient. 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  Other differential diagnosis include GI tract adenocarcinoma, cholangiocarcinoma etc. CEA is elevated.  His CA 19-9 is normal. CT and MRI imaging is did not show any GI primary.I recommend a PET scan for further evaluation- patient rescheduled appt.   Labs are reviewed and discussed with patient. Proceed with cycle 1 D8 Gemcitabine /Abraxane    Encounter for antineoplastic chemotherapy Chemotherapy plan as listed above  Neoplasm related pain Recommend patient to continue OxyContin  10 mg every 12 hours, plus oxycodone  5 mg every 4-6 hours as needed - refilled.  She forgot to take her pain med today and got a dose of oxycodone  5mg  today at infusion center.   Uncontrolled type 2 diabetes mellitus with hyperglycemia, without long-term current use of insulin  [name]) Continue follow-up with primary care provider.  Last A1c 8.7.   Orders Placed This Encounter  Procedures   CEA    Standing Status:   Future    Expected Date:   12/11/2023    Expiration Date:   12/10/2024   Follow-up in 1 week to start chemotherapy. All questions were answered. The patient knows to call the clinic with any problems, questions or concerns.  [name], MD, PhD [facility] Hematology Oncology 12/04/2023    HISTORY OF PRESENTING ILLNESS:  Kristina Cruz 63 y.o. female presents to establish care for  I have reviewed her chart and materials related to her cancer extensively and collaborated history with the patient. Summary of oncologic history is as follows: Oncology History   Pancreatic cancer metastasized to liver (HCC)  08/21/2023 Imaging   CT abdomen pelvis with contrast showed 1. Masslike thickening involving the proximal and mid tail of pancreas with relative hypoenhancement measures approximately 3.9 x 5.5 cm. In the absence of signs/symptoms of acute pancreatitis underlying neoplastic process cannot be excluded. Further evaluation with nonemergent contrast enhanced MRI of the abdomen is advised. Note: Given patient's body habitus and the motion artifact observed on the current exam an MRI obtained at this time is likely to be severely limited and likely nondiagnostic. MRI should be obtained only once the patient is clinically stable, and is able to remain motionless and breath hold. 2. Patchy ground-glass and airspace densities identified within both lower lobes. These are new compared with the previous exam and are concerning for underlying inflammatory/infectious process. 3. Sigmoid diverticulosis without signs of acute diverticulitis. 4. Marked diastasis recti with ventral herniation of the large and small bowel loops. 5. Periumbilical hernia is containing fat only.   08/24/2023 Imaging   MRI abdomen wo contrast  1. Moderately suboptimal unenhanced exam. 2. Heterogeneous masslike thickening of the pancreatic body/tail again seen. The lesion remains indeterminate on this exam. Differential diagnosis includes pancreatic tumor, autoimmune pancreatitis, mass forming chronic pancreatitis, etc. Please see above for follow-up recommendations. 3. Mild diffuse hepatic steatosis. There are indeterminate areas in the liver, which can also be better evaluated on the contrast-enhanced MRI abdomen. 4. Other observations, as described above.   11/05/2023 Initial Diagnosis   Pancreatic cancer metastasized to liver  Patient was hospitalized from 11/05/2023 - 11/10/2023 She presented to emergency room due to nausea, vomiting and abdominal pain. CT scan showed  multiple hypodense hepatic lesions increasing number and size from prior imaging.  Pancreatic tail mass, hypodense lesion in the anterior spleen.  11/09/2023 ultrasound-guided biopsy was obtained. Pathology showed moderately to poorly differentiated adenocarcinoma with extensive areas of tumor necrosis. The adenocarcinoma is positive for cytokeratin 7 and CDX2 (focal).     Cytokeratin 20, TTF-1, and GATA3 are negative. The pattern of immunoreactivity is not specific and the differential diagnosis includes metastatic adenocarcinoma from the pancreaticobiliary tract and upper GI. Intrahepatic       cholangiocarcinoma is also a diagnostic possibility. Given the radiographic  findings a pancreaticobiliary primary is favored and correlation with clinical impression is required.    11/17/2023 Cancer Staging   Staging form: Exocrine Pancreas, AJCC 8th Edition - Clinical stage from 11/17/2023: Stage IV (cT3, cNX, pM1) - Signed by [name], MD on 11/17/2023 Stage prefix: Initial diagnosis    Imaging   CT abdomen pelvis w contrast  1. Multiple hypodense hepatic lesions, increased in number and size from prior imaging, highly suspicious for metastatic disease. 2. Hypodense lesion in the pancreatic tail measures 7.2 x 3.9 cm, increased in size from prior. The increase in size favors pancreatic neoplasm, with additional etiologies less likely. 3. New hypodense lesion in the anterior spleen, 13 mm, suspicious for metastatic disease. 4. Laxity of the anterior abdominal wall with midline ventral abdominal wall hernia containing small and to a lesser extent large bowel. No bowel obstruction or inflammation. 5. Colonic diverticulosis without diverticulitis.   Aortic Atherosclerosis (ICD10-I70.0)    11/27/2023 -  Chemotherapy   Patient is on Treatment Plan : PANCREATIC Abraxane  D1,8,15 + Gemcitabine  D1,8,15 q28d      Patient would like to get a medi port.  She presents for evaluation prior to chemo.  Overall  she tolerated last treatment well.  Mild nausea and antiemetics helps.  Appetite has decreased.   has been off Ozempic. Patient takes pain medication and feels that pain is well-controlled.  Sh forgot to take pain meds this morning and the rates his pain 10 out of 10.   MEDICAL HISTORY:  Past Medical History:  Diagnosis Date   Anemia    Asthma    Back pain    Cataract    Diabetes mellitus without complication (HCC)    GERD (gastroesophageal reflux disease)    Hypertension    Morbid obesity with BMI of 60.0-69.9, adult (HCC)    Restless leg syndrome     SURGICAL HISTORY: Past Surgical History:  Procedure Laterality Date   ABDOMINAL HYSTERECTOMY     CATARACT EXTRACTION W/PHACO Left 04/13/2020   Procedure: CATARACT EXTRACTION PHACO AND INTRAOCULAR LENS PLACEMENT (IOC);  Surgeon: Ola Berger, MD;  Location: [facility] ORS;  Service: Ophthalmology;  Laterality: Left;  US  00:36.0 CDE 5.95 Fluid Pack lot # [id number]   HYSTEROSCOPY WITH D & C N/A 07/14/2017   Procedure: DILATATION AND CURETTAGE /HYSTEROSCOPY;  Surgeon: Alben Alma, MD;  Location: [facility] ORS;  Service: Gynecology;  Laterality: N/A;   POLYPECTOMY  2015    SOCIAL HISTORY: Social History   Socioeconomic History   Marital status: Single    Spouse name: Not on file   Number of children: Not on file   Years of education: Not on file   Highest education level: Not on file  Occupational History   Not on file  Tobacco Use   Smoking status: Never   Smokeless tobacco: Never  Vaping Use   Vaping status: Never Used  Substance and Sexual Activity  Alcohol use: No   Drug use: No   Sexual activity: Never    Birth control/protection: None  Other Topics Concern   Not on file  Social History Narrative   Not on file   Social Drivers of Health   Financial Resource Strain: Low Risk  (09/26/2022)   [facility]   Overall Financial Resource Strain (CARDIA)     Difficulty of Paying Living Expenses: Not hard at all  Food Insecurity: No Food Insecurity (11/06/2023)   Hunger Vital Sign    Worried About Running Out of Food in the Last Year: Never true    Ran Out of Food in the Last Year: Never true  Transportation Needs: No Transportation Needs (11/06/2023)   PRAPARE - Administrator, Civil Service (Medical): No    Lack of Transportation (Non-Medical): No  Recent Concern: Transportation Needs - Unmet Transportation Needs (09/28/2023)   PRAPARE - Administrator, Civil Service (Medical): Yes    Lack of Transportation (Non-Medical): Yes  Physical Activity: Not on file  Stress: Not on file  Social Connections: Socially Isolated (11/06/2023)   Social Connection and Isolation Panel [NHANES]    Frequency of Communication with Friends and Family: More than three times a week    Frequency of Social Gatherings with Friends and Family: More than three times a week    Attends Religious Services: Never    Database administrator or Organizations: No    Attends Banker Meetings: Never    Marital Status: Never married  Intimate Partner Violence: Not At Risk (11/06/2023)   Humiliation, Afraid, Rape, and Kick questionnaire    Fear of Current or Ex-Partner: No    Emotionally Abused: No    Physically Abused: No    Sexually Abused: No    FAMILY HISTORY: Family History  Problem Relation Age of Onset   Breast cancer Mother 36   Diabetes Mother    Hypertension Mother    Ovarian cancer Paternal Aunt        ?   Diabetes Father    Hypertension Father     ALLERGIES:  is allergic to peanut-containing drug products.  MEDICATIONS:  Current Outpatient Medications  Medication Sig Dispense Refill   acetaminophen  (TYLENOL  8 HOUR ARTHRITIS PAIN) 650 MG CR tablet Take 3 tablets (1,950 mg total) by mouth every 8 (eight) hours as needed for pain. Do not exceed 4000 mg total in a day.  Home med.     amLODipine  (NORVASC ) 10 MG tablet Take  1 tablet (10 mg total) by mouth daily. 30 tablet 0   azelastine (ASTELIN) 0.1 % nasal spray Place 1 spray into both nostrils 2 (two) times daily.     EPINEPHrine  0.3 mg/0.3 mL IJ SOAJ injection Inject 0.3 mg into the muscle as needed for anaphylaxis. 1 each 0   fluticasone  (FLONASE ) 50 MCG/ACT nasal spray Place 1 spray into both nostrils daily. 9.9 mL 0   fluticasone  furoate-vilanterol (BREO ELLIPTA ) 100-25 MCG/ACT AEPB Inhale 1 puff into the lungs daily. 28 each 1   furosemide  (LASIX ) 40 MG tablet Take 40 mg by mouth every other day.     gabapentin  (NEURONTIN ) 300 MG capsule Take 600 mg by mouth 2 (two) times daily.     ipratropium-albuterol  (DUONEB) 0.5-2.5 (3) MG/3ML SOLN Take 3 mLs by nebulization every 4 (four) hours as needed (wheezing / shortness of breath). 360 mL 1   levocetirizine (XYZAL) 5  MG tablet Take 5 mg by mouth daily.     metFORMIN  (GLUCOPHAGE ) 1000 MG tablet Take 1,000 mg by mouth 2 (two) times daily.     morphine  (MS CONTIN ) 15 MG 12 hr tablet Take 1 tablet (15 mg total) by mouth every 12 (twelve) hours. 30 tablet 0   naloxone  (NARCAN ) nasal spray 4 mg/0.1 mL SPRAY 1 SPRAY INTO ONE NOSTRIL AS DIRECTED FOR OPIOID OVERDOSE (TURN PERSON ON SIDE AFTER DOSE. IF NO RESPONSE IN 2-3 MINUTES OR PERSON RESPONDS BUT RELAPSES, REPEAT USING A NEW SPRAY DEVICE AND SPRAY INTO THE OTHER NOSTRIL. CALL 911 AFTER USE.) * EMERGENCY USE ONLY * 1 each 0   ondansetron  (ZOFRAN ) 8 MG tablet Take 1 tablet (8 mg total) by mouth every 8 (eight) hours as needed for nausea or vomiting. 30 tablet 1   prochlorperazine  (COMPAZINE ) 10 MG tablet Take 1 tablet (10 mg total) by mouth every 6 (six) hours as needed for nausea or vomiting. 30 tablet 1   rOPINIRole  (REQUIP ) 2 MG tablet Take 2 mg by mouth in the morning and at bedtime.      Semaglutide, 1 MG/DOSE, 4 MG/3ML SOPN Inject 1 mg into the skin once a week.     senna-docusate (SENOKOT-S) 8.6-50 MG tablet Take 1 tablet by mouth daily. 30 tablet 0   VENTOLIN  HFA  108 (90 Base) MCG/ACT inhaler Inhale 2 puffs into the lungs every 4 (four) hours as needed for wheezing or shortness of breath. 18 g 1   oxyCODONE  (OXY IR/ROXICODONE ) 5 MG immediate release tablet Take 1 tablet (5 mg total) by mouth every 4 (four) hours as needed for moderate pain (pain score 4-6) or severe pain (pain score 7-10). 90 tablet 0   No current facility-administered medications for this visit.    Review of Systems  Constitutional:  Positive for fatigue. Negative for appetite change, chills and fever.  HENT:   Negative for hearing loss and voice change.   Eyes:  Negative for eye problems.  Respiratory:  Negative for chest tightness and cough.   Cardiovascular:  Negative for chest pain.  Gastrointestinal:  Positive for abdominal pain. Negative for abdominal distention and blood in stool.  Endocrine: Negative for hot flashes.  Genitourinary:  Negative for difficulty urinating and frequency.   Musculoskeletal:  Negative for arthralgias.  Skin:  Negative for itching and rash.  Neurological:  Negative for extremity weakness.  Hematological:  Negative for adenopathy.  Psychiatric/Behavioral:  Negative for confusion.      PHYSICAL EXAMINATION: ECOG PERFORMANCE STATUS: 1 - Symptomatic but completely ambulatory  Vitals:   12/04/23 0859  BP: 121/74  Pulse: 69  Resp: 18  Temp: (!) 97.2 F (36.2 C)   Filed Weights   12/04/23 0859  Weight: 263 lb (119.3 kg)    Physical Exam Constitutional:      General: She is not in acute distress.    Appearance: She is obese. She is not diaphoretic.  HENT:     Head: Normocephalic and atraumatic.  Eyes:     General: No scleral icterus. Cardiovascular:     Rate and Rhythm: Normal rate and regular rhythm.     Heart sounds: No murmur heard. Pulmonary:     Effort: Pulmonary effort is normal. No respiratory distress.     Breath sounds: No wheezing.     Comments: Decreased breath sound bilaterally Abdominal:     General: Bowel sounds  are normal. There is no distension.     Palpations: Abdomen is soft.  Musculoskeletal:        General: Normal range of motion.     Cervical back: Normal range of motion and neck supple.  Skin:    General: Skin is warm and dry.     Findings: No erythema.  Neurological:     Mental Status: She is alert and oriented to person, place, and time. Mental status is at baseline.     Motor: No abnormal muscle tone.  Psychiatric:        Mood and Affect: Mood and affect normal.      LABORATORY DATA:  I have reviewed the data as listed    Latest Ref Rng & Units 12/04/2023    8:50 AM 11/27/2023   10:53 AM 11/09/2023    5:03 AM  CBC  WBC 4.0 - 10.5 K/uL 4.9  7.4  8.1   Hemoglobin 12.0 - 15.0 g/dL 65.7  84.6  96.2   Hematocrit 36.0 - 46.0 % 37.2  41.8  38.8   Platelets 150 - 400 K/uL 143  241  248       Latest Ref Rng & Units 12/04/2023    8:49 AM 11/27/2023   10:53 AM 11/06/2023    4:49 AM  CMP  Glucose 70 - 99 mg/dL 952  841  324   BUN 8 - 23 mg/dL 8  9  11    Creatinine 0.44 - 1.00 mg/dL 4.01  0.27  2.53   Sodium 135 - 145 mmol/L 138  137  138   Potassium 3.5 - 5.1 mmol/L 3.5  4.0  3.7   Chloride 98 - 111 mmol/L 100  97  100   CO2 22 - 32 mmol/L 26  28  30    Calcium  8.9 - 10.3 mg/dL 8.8  9.0  9.1   Total Protein 6.5 - 8.1 g/dL 7.2  7.6  7.2   Total Bilirubin 0.0 - 1.2 mg/dL 0.7  1.0  0.8   Alkaline Phos 38 - 126 U/L 77  85  77   AST 15 - 41 U/L 15  13  13    ALT 0 - 44 U/L 12  9  10       RADIOGRAPHIC STUDIES: I have personally reviewed the radiological images as listed and agreed with the findings in the report. US  BIOPSY (LIVER) Result Date: 11/09/2023 INDICATION: 63 year old female presents for biopsy of liver lesion EXAM: ULTRASOUND-GUIDED LIVER MASS BIOPSY MEDICATIONS: None. ANESTHESIA/SEDATION: Moderate (conscious) sedation was employed during this procedure. A total of Versed  1.0 mg and Fentanyl  50 mcg was administered intravenously by the radiology nurse. Total intra-service  moderate Sedation Time: 10 minutes. The patient's level of consciousness and vital signs were monitored continuously by radiology nursing throughout the procedure under my direct supervision. COMPLICATIONS: None PROCEDURE: Informed written consent was obtained from the patient after a thorough discussion of the procedural risks, benefits and alternatives. All questions were addressed. Maximal Sterile Barrier Technique was utilized including caps, mask, sterile gowns, sterile gloves, sterile drape, hand hygiene and skin antiseptic. A timeout was performed prior to the initiation of the procedure. Ultrasound survey of the right liver lobe performed with images stored and sent to PACs. The right lower thorax/right upper abdomen was prepped with chlorhexidine  in a sterile fashion, and a sterile drape was applied covering the operative field. A sterile gown and sterile gloves were used for the procedure. Local anesthesia was provided with 1% Lidocaine . The patient was prepped and draped sterilely and the skin and subcutaneous tissues were generously infiltrated  with 1% lidocaine . A 17 gauge introducer needle was then advanced under ultrasound guidance in sub costal location into the right liver lobe, targeting hypoechoic mass of the right liver. The stylet was removed, and multiple separate 18 gauge core biopsy were retrieved. Samples were placed into formalin for transportation to the lab. Gel-Foam slurry then infused with a small amount of saline for assistance with hemostasis. The needle was removed, and a final ultrasound image was performed. The patient tolerated the procedure well and remained hemodynamically stable throughout. No complications were encountered and no significant blood loss was encounter. IMPRESSION: Status post ultrasound-guided liver mass biopsy Signed, Marciano Settles. Rexine Cater, RPVI [facility] Radiology Electronically Signed   By: Myrlene Asper  D.O.   On: 11/09/2023 15:11   CT ABDOMEN PELVIS W CONTRAST Result Date: 11/05/2023 CLINICAL DATA:  Acute nonlocalized abdominal pain. Patient reports pain for 2 months, progressive. EXAM: CT ABDOMEN AND PELVIS WITH CONTRAST TECHNIQUE: Multidetector CT imaging of the abdomen and pelvis was performed using the standard protocol following bolus administration of intravenous contrast. RADIATION DOSE REDUCTION: This exam was performed according to the departmental dose-optimization program which includes automated exposure control, adjustment of the mA and/or kV according to patient size and/or use of iterative reconstruction technique. CONTRAST:  OMNIPAQUE  IOHEXOL  350 MG/ML SOLN COMPARISON:  CT 08/21/2023, MRI 08/24/2023 FINDINGS: Lower chest: No basilar airspace disease or pleural effusion. Hepatobiliary: Multiple hypodense hepatic lesions, increased in number and size from prior imaging. Motion artifact limits detailed assessment. Largest lesion is in the inferior right hepatic lobe measuring 2.6 cm, series 2, image 35, not previously demonstrated on CT. Subcapsular lesion in the right lobe measures 19 mm, series 2, image 17, not previously demonstrated on CT. Lesion in the left lobe measures 16 mm, series 2, image 18, not previously demonstrated on CT. Cholecystectomy without biliary dilatation. Pancreas: Hypodense lesion in the pancreatic tail measures 7.2 x 3.9 cm, previously 5 5 x 3.7 cm. No adjacent inflammation. There is no proximal pancreatic ductal dilatation, motion limited. Spleen: Hypodense lesion in the anterior spleen is new from prior imaging, 13 mm series 2, image 7. Adrenals/Urinary Tract: No adrenal nodule. No hydronephrosis or renal calculi. No suspicious renal lesion. Unremarkable urinary bladder. Stomach/Bowel: Laxity of the anterior abdominal wall with midline ventral abdominal wall hernia containing small and to a lesser extent large bowel. There is no bowel obstruction or inflammation.  Moderate volume of stool in the colon. Scattered colonic diverticula without diverticulitis. The stomach is nondistended. Vascular/Lymphatic: Minor aortic atherosclerosis. No aortic aneurysm. No definite enlarged lymph nodes on this motion limited exam. Reproductive: Status post hysterectomy. No adnexal masses. Other: No ascites. Small fat containing periumbilical hernia. No definite omental thickening. Musculoskeletal: Advanced degenerative change throughout the spine. Advanced bilateral hip arthropathy. Advanced bilateral sacroiliac arthropathy. No evidence of focal bone lesion or acute osseous findings. IMPRESSION: 1. Multiple hypodense hepatic lesions, increased in number and size from prior imaging, highly suspicious for metastatic disease. 2. Hypodense lesion in the pancreatic tail measures 7.2 x 3.9 cm, increased in size from prior. The increase in size favors pancreatic neoplasm, with additional etiologies less likely. 3. New hypodense lesion in the anterior spleen, 13 mm, suspicious for metastatic disease. 4. Laxity of the anterior abdominal wall with midline ventral abdominal wall hernia containing small and to a lesser extent large bowel. No bowel obstruction or inflammation. 5. Colonic diverticulosis without diverticulitis. Aortic Atherosclerosis (ICD10-I70.0). Electronically Signed   By: Prentice Brochure  Sanford M.D.   On: 11/05/2023 23:00

## 2023-12-04 NOTE — Patient Instructions (Signed)
 [facility] - A DEPT OF [facility]  Discharge Instructions: Thank you for choosing [facility] to provide your oncology and hematology care.  If you have a lab appointment with the Cancer Center, please go directly to the Cancer Center and check in at the registration area.  Wear comfortable clothing and clothing appropriate for easy access to any Portacath or PICC line.   We strive to give you quality time with your provider. You may need to reschedule your appointment if you arrive late (15 or more minutes).  Arriving late affects you and other patients whose appointments are after yours.  Also, if you miss three or more appointments without notifying the office, you may be dismissed from the clinic at the provider's discretion.      For prescription refill requests, have your pharmacy contact our office and allow 72 hours for refills to be completed.    Today you received the following chemotherapy and/or immunotherapy agents Gemzar and Abraxane.      To help prevent nausea and vomiting after your treatment, we encourage you to take your nausea medication as directed.  BELOW ARE SYMPTOMS THAT SHOULD BE REPORTED IMMEDIATELY: *FEVER GREATER THAN 100.4 F (38 C) OR HIGHER *CHILLS OR SWEATING *NAUSEA AND VOMITING THAT IS NOT CONTROLLED WITH YOUR NAUSEA MEDICATION *UNUSUAL SHORTNESS OF BREATH *UNUSUAL BRUISING OR BLEEDING *URINARY PROBLEMS (pain or burning when urinating, or frequent urination) *BOWEL PROBLEMS (unusual diarrhea, constipation, pain near the anus) TENDERNESS IN MOUTH AND THROAT WITH OR WITHOUT PRESENCE OF ULCERS (sore throat, sores in mouth, or a toothache) UNUSUAL RASH, SWELLING OR PAIN  UNUSUAL VAGINAL DISCHARGE OR ITCHING   Items with * indicate a potential emergency and should be followed up as soon as possible or go to the Emergency Department if any problems should occur.  Please show the CHEMOTHERAPY ALERT CARD or  IMMUNOTHERAPY ALERT CARD at check-in to the Emergency Department and triage nurse.  Should you have questions after your visit or need to cancel or reschedule your appointment, please contact [facility] - A DEPT OF [facility]  [phone] and follow the prompts.  Office hours are 8:00 a.m. to 4:30 p.m. Monday - Friday. Please note that voicemails left after 4:00 p.m. may not be returned until the following business day.  We are closed weekends and major holidays. You have access to a nurse at all times for urgent questions. Please call the main number to the clinic [phone] and follow the prompts.  For any non-urgent questions, you may also contact your provider using MyChart. We now offer e-Visits for anyone 54 and older to request care online for non-urgent symptoms. For details visit [email or web address].   Also download the MyChart app! Go to the app store, search "MyChart", open the app, select [place], and log in with your MyChart username and password.

## 2023-12-07 ENCOUNTER — Ambulatory Visit: Admission: RE | Admit: 2023-12-07 | Source: Ambulatory Visit

## 2023-12-08 ENCOUNTER — Other Ambulatory Visit: Payer: Self-pay

## 2023-12-09 ENCOUNTER — Inpatient Hospital Stay: Admitting: Hospice and Palliative Medicine

## 2023-12-09 ENCOUNTER — Encounter: Payer: Self-pay | Admitting: Oncology

## 2023-12-09 DIAGNOSIS — K8689 Other specified diseases of pancreas: Secondary | ICD-10-CM

## 2023-12-09 DIAGNOSIS — G893 Neoplasm related pain (acute) (chronic): Secondary | ICD-10-CM

## 2023-12-09 DIAGNOSIS — C259 Malignant neoplasm of pancreas, unspecified: Secondary | ICD-10-CM

## 2023-12-09 DIAGNOSIS — Z515 Encounter for palliative care: Secondary | ICD-10-CM

## 2023-12-09 DIAGNOSIS — R110 Nausea: Secondary | ICD-10-CM | POA: Diagnosis not present

## 2023-12-09 DIAGNOSIS — R160 Hepatomegaly, not elsewhere classified: Secondary | ICD-10-CM | POA: Diagnosis not present

## 2023-12-09 NOTE — Progress Notes (Signed)
 Virtual Visit via Telephone Note  I connected with [name] on 12/09/23 at  3:00 PM EDT by telephone and verified that I am speaking with the correct person using two identifiers.  Location: Patient: Home Provider: Clinic   I discussed the limitations, risks, security and privacy concerns of performing an evaluation and management service by telephone and the availability of in person appointments. I also discussed with the patient that there may be a patient responsible charge related to this service. The patient expressed understanding and agreed to proceed.   History of Present Illness: Kristina Cruz is a 63 y.o. female with multiple medical problems including diabetes, hypertension, restless leg syndrome, and pancreatic mass diagnosed in January 2025 who was lost to follow-up.  Patient admitted 11/05/2023 with intractable abdominal pain.  CT revealed increased size of pancreatic tail mass with new hypodense lesion in the anterior spleen and multiple hepatic masses concerning for metastatic disease.  Palliative care consulted to address goals manage ongoing symptoms.    Observations/Objective: Follow-up.  Spoke with patient by phone.  Today, patient reports that her pain is stable but she is having nausea without vomiting.  She is feeling drowsy after having taken the prochlorperazine .  She has been taking ondansetron  every 8 hours.  She feels that sometimes the antiemetics help and other times they are less effective.  She states that she received an antiemetic that was a dissolvable tablet while hospitalized and it was more effective.  I question if this with ondansetron  ODT.  I suggested that we could switch her antiemetic regimen to include ondansetron  ODT and/or rotate from prochlorperazine  to olanzapine.  However, she stated that she wanted to continue with her current regimen for now.  Patient reports stable pain.  Assessment and Plan: Probable stage IV pancreatic cancer  -on Abraxane /gemcitabine .   Neoplasm related pain -continue MS Contin /oxycodone   Nausea -likely secondary to chemotherapy.  Continue ondansetron /prochlorperazine .  Could consider rotating to olanzapine  Follow Up Instructions: Follow-up telephone visit 1 month   I discussed the assessment and treatment plan with the patient. The patient was provided an opportunity to ask questions and all were answered. The patient agreed with the plan and demonstrated an understanding of the instructions.   The patient was advised to call back or seek an in-person evaluation if the symptoms worsen or if the condition fails to improve as anticipated.  I provided 10 minutes of non-face-to-face time during this encounter.   Peggyann Bower, NP

## 2023-12-09 NOTE — Telephone Encounter (Signed)
 pt accepted port placement appt: Thurs 5/1 @ 11a arrive 10a

## 2023-12-10 ENCOUNTER — Other Ambulatory Visit: Payer: Self-pay

## 2023-12-11 ENCOUNTER — Encounter: Payer: Self-pay | Admitting: Oncology

## 2023-12-11 ENCOUNTER — Inpatient Hospital Stay

## 2023-12-11 ENCOUNTER — Inpatient Hospital Stay: Admitting: Oncology

## 2023-12-11 VITALS — BP 140/76 | HR 82 | Temp 96.4°F | Resp 18 | Wt 258.6 lb

## 2023-12-11 VITALS — BP 134/70 | HR 79 | Temp 97.4°F | Resp 19

## 2023-12-11 DIAGNOSIS — E876 Hypokalemia: Secondary | ICD-10-CM

## 2023-12-11 DIAGNOSIS — C259 Malignant neoplasm of pancreas, unspecified: Secondary | ICD-10-CM

## 2023-12-11 DIAGNOSIS — C787 Secondary malignant neoplasm of liver and intrahepatic bile duct: Secondary | ICD-10-CM

## 2023-12-11 DIAGNOSIS — G620 Drug-induced polyneuropathy: Secondary | ICD-10-CM

## 2023-12-11 DIAGNOSIS — G893 Neoplasm related pain (acute) (chronic): Secondary | ICD-10-CM | POA: Diagnosis not present

## 2023-12-11 DIAGNOSIS — Z5111 Encounter for antineoplastic chemotherapy: Secondary | ICD-10-CM | POA: Diagnosis not present

## 2023-12-11 DIAGNOSIS — T451X5A Adverse effect of antineoplastic and immunosuppressive drugs, initial encounter: Secondary | ICD-10-CM

## 2023-12-11 LAB — CBC WITH DIFFERENTIAL (CANCER CENTER ONLY)
Abs Immature Granulocytes: 0.04 10*3/uL (ref 0.00–0.07)
Basophils Absolute: 0 10*3/uL (ref 0.0–0.1)
Basophils Relative: 0 %
Eosinophils Absolute: 0.2 10*3/uL (ref 0.0–0.5)
Eosinophils Relative: 5 %
HCT: 34.6 % — ABNORMAL LOW (ref 36.0–46.0)
Hemoglobin: 11.2 g/dL — ABNORMAL LOW (ref 12.0–15.0)
Immature Granulocytes: 1 %
Lymphocytes Relative: 26 %
Lymphs Abs: 0.8 10*3/uL (ref 0.7–4.0)
MCH: 27.5 pg (ref 26.0–34.0)
MCHC: 32.4 g/dL (ref 30.0–36.0)
MCV: 85 fL (ref 80.0–100.0)
Monocytes Absolute: 0.2 10*3/uL (ref 0.1–1.0)
Monocytes Relative: 7 %
Neutro Abs: 1.9 10*3/uL (ref 1.7–7.7)
Neutrophils Relative %: 61 %
Platelet Count: 117 10*3/uL — ABNORMAL LOW (ref 150–400)
RBC: 4.07 MIL/uL (ref 3.87–5.11)
RDW: 13.9 % (ref 11.5–15.5)
WBC Count: 3.2 10*3/uL — ABNORMAL LOW (ref 4.0–10.5)
nRBC: 0.6 % — ABNORMAL HIGH (ref 0.0–0.2)

## 2023-12-11 LAB — CMP (CANCER CENTER ONLY)
ALT: 20 U/L (ref 0–44)
AST: 26 U/L (ref 15–41)
Albumin: 3.3 g/dL — ABNORMAL LOW (ref 3.5–5.0)
Alkaline Phosphatase: 81 U/L (ref 38–126)
Anion gap: 11 (ref 5–15)
BUN: 9 mg/dL (ref 8–23)
CO2: 30 mmol/L (ref 22–32)
Calcium: 8.6 mg/dL — ABNORMAL LOW (ref 8.9–10.3)
Chloride: 95 mmol/L — ABNORMAL LOW (ref 98–111)
Creatinine: 0.88 mg/dL (ref 0.44–1.00)
GFR, Estimated: >60 mL/min (ref >60)
Glucose, Bld: 180 mg/dL — ABNORMAL HIGH (ref 70–99)
Potassium: 3.2 mmol/L — ABNORMAL LOW (ref 3.5–5.1)
Sodium: 136 mmol/L (ref 135–145)
Total Bilirubin: 0.5 mg/dL (ref 0.0–1.2)
Total Protein: 7.5 g/dL (ref 6.5–8.1)

## 2023-12-11 MED ORDER — POTASSIUM CHLORIDE CRYS ER 20 MEQ PO TBCR: 20.0000 meq | EXTENDED_RELEASE_TABLET | Freq: Every day | ORAL | 0 refills | Status: DC

## 2023-12-11 MED ORDER — SODIUM CHLORIDE 0.9 % IV SOLN
INTRAVENOUS | Status: DC
  Filled 2023-12-11: qty 250

## 2023-12-11 MED ORDER — PROCHLORPERAZINE MALEATE 10 MG PO TABS
10.0000 mg | ORAL_TABLET | Freq: Once | ORAL | Status: AC
  Administered 2023-12-11: 10 mg via ORAL

## 2023-12-11 MED ORDER — SODIUM CHLORIDE 0.9 % IV SOLN
1000.0000 mg/m2 | Freq: Once | INTRAVENOUS | Status: AC
  Administered 2023-12-11: 2318 mg via INTRAVENOUS
  Filled 2023-12-11: qty 60.96

## 2023-12-11 MED ORDER — GABAPENTIN 100 MG PO CAPS: 100.0000 mg | ORAL_CAPSULE | Freq: Three times a day (TID) | ORAL | 0 refills | Status: DC

## 2023-12-11 MED ORDER — PACLITAXEL PROTEIN-BOUND CHEMO INJECTION 100 MG
125.0000 mg/m2 | Freq: Once | INTRAVENOUS | Status: AC
  Administered 2023-12-11: 300 mg via INTRAVENOUS
  Filled 2023-12-11: qty 60

## 2023-12-11 NOTE — Progress Notes (Signed)
 Nutrition  Patient declined nutrition visit today.    RD available if needed in the future  Alixandria Friedt B. Zollie Hipp, CSO, LDN Registered Dietitian [phone]

## 2023-12-11 NOTE — Progress Notes (Signed)
 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  ASSESSMENT & PLAN:   Pancreatic cancer metastasized to liver [facility]) Imaging findings and different lesion biopsy results were reviewed and discussed with patient. 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  Other differential diagnosis include GI tract adenocarcinoma, cholangiocarcinoma etc. CEA is elevated.  His CA 19-9 is normal. CT and MRI imaging is did not show any GI primary.I recommend a PET scan for further evaluation- patient rescheduled appt.   Labs are reviewed and discussed with patient. Proceed with cycle 1 D15 Gemcitabine /Abraxane    Encounter for antineoplastic chemotherapy Chemotherapy plan as listed above  Neoplasm related pain Recommend patient to continue OxyContin  10 mg every 12 hours, plus oxycodone  5 mg every 4-6 hours as needed - refilled.  .   Hypokalemia Recommend potassium 20meq daily x 3  Chemotherapy-induced neuropathy (HCC) Recommend gabapentin  100mg  TID PRN.     Orders Placed This Encounter  Procedures   CEA    Standing Status:   Future    Expected Date:   12/25/2023    Expiration Date:   12/24/2024   CBC with Differential (Cancer Center Only)    Standing Status:   Future    Expected Date:   12/25/2023    Expiration Date:   12/24/2024   CMP (Cancer Center only)    Standing Status:   Future    Expected Date:   12/25/2023    Expiration Date:   12/24/2024   CBC with Differential (Cancer Center Only)    Standing Status:   Future    Expected Date:   01/01/2024    Expiration Date:   12/31/2024   CMP (Cancer Center only)    Standing Status:   Future    Expected Date:   01/01/2024    Expiration Date:   12/31/2024   CBC with Differential (Cancer Center Only)    Standing Status:   Future    Expected Date:   01/08/2024    Expiration  Date:   01/07/2025   CMP (Cancer Center only)    Standing Status:   Future    Expected Date:   01/08/2024    Expiration Date:   01/07/2025   Follow-up in 2 weeks  All questions were answered. The patient knows to call the clinic with any problems, questions or concerns.  [name], MD, PhD [facility] Hematology Oncology 12/11/2023    HISTORY OF PRESENTING ILLNESS:  Kristina Cruz 63 y.o. female presents to establish care for  I have reviewed her chart and materials related to her cancer extensively and collaborated history with the patient. Summary of oncologic history is as follows: Oncology History  Pancreatic cancer metastasized to liver (HCC)  08/21/2023 Imaging   CT abdomen pelvis with contrast showed 1. Masslike thickening involving the proximal and mid tail of pancreas with relative hypoenhancement measures approximately 3.9 x 5.5 cm. In the absence of signs/symptoms of acute pancreatitis underlying neoplastic process cannot be excluded. Further evaluation with nonemergent contrast enhanced MRI of the abdomen is advised. Note: Given patient's body habitus and the motion artifact observed on the current exam an MRI obtained at this time is likely to be severely limited and likely nondiagnostic. MRI should be obtained only once the patient is clinically stable, and is able to remain motionless and breath hold. 2. Patchy ground-glass and airspace densities identified within both lower  lobes. These are new compared with the previous exam and are concerning for underlying inflammatory/infectious process. 3. Sigmoid diverticulosis without signs of acute diverticulitis. 4. Marked diastasis recti with ventral herniation of the large and small bowel loops. 5. Periumbilical hernia is containing fat only.   08/24/2023 Imaging   MRI abdomen wo contrast  1. Moderately suboptimal unenhanced exam. 2. Heterogeneous masslike thickening of the pancreatic body/tail again seen. The lesion  remains indeterminate on this exam. Differential diagnosis includes pancreatic tumor, autoimmune pancreatitis, mass forming chronic pancreatitis, etc. Please see above for follow-up recommendations. 3. Mild diffuse hepatic steatosis. There are indeterminate areas in the liver, which can also be better evaluated on the contrast-enhanced MRI abdomen. 4. Other observations, as described above.   11/05/2023 Initial Diagnosis   Pancreatic cancer metastasized to liver  Patient was hospitalized from 11/05/2023 - 11/10/2023 She presented to emergency room due to nausea, vomiting and abdominal pain. CT scan showed multiple hypodense hepatic lesions increasing number and size from prior imaging.  Pancreatic tail mass, hypodense lesion in the anterior spleen.  11/09/2023 ultrasound-guided biopsy was obtained. Pathology showed moderately to poorly differentiated adenocarcinoma with extensive areas of tumor necrosis. The adenocarcinoma is positive for cytokeratin 7 and CDX2 (focal).     Cytokeratin 20, TTF-1, and GATA3 are negative. The pattern of immunoreactivity is not specific and the differential diagnosis includes metastatic adenocarcinoma from the pancreaticobiliary tract and upper GI. Intrahepatic       cholangiocarcinoma is also a diagnostic possibility. Given the radiographic  findings a pancreaticobiliary primary is favored and correlation with clinical impression is required.    11/17/2023 Cancer Staging   Staging form: Exocrine Pancreas, AJCC 8th Edition - Clinical stage from 11/17/2023: Stage IV (cT3, cNX, pM1) - Signed by [name], MD on 11/17/2023 Stage prefix: Initial diagnosis    Imaging   CT abdomen pelvis w contrast  1. Multiple hypodense hepatic lesions, increased in number and size from prior imaging, highly suspicious for metastatic disease. 2. Hypodense lesion in the pancreatic tail measures 7.2 x 3.9 cm, increased in size from prior. The increase in size favors pancreatic neoplasm,  with additional etiologies less likely. 3. New hypodense lesion in the anterior spleen, 13 mm, suspicious for metastatic disease. 4. Laxity of the anterior abdominal wall with midline ventral abdominal wall hernia containing small and to a lesser extent large bowel. No bowel obstruction or inflammation. 5. Colonic diverticulosis without diverticulitis.   Aortic Atherosclerosis (ICD10-I70.0)    11/27/2023 -  Chemotherapy   Patient is on Treatment Plan : PANCREATIC Abraxane  D1,8,15 + Gemcitabine  D1,8,15 q28d      Patient would like to get a medi port.  She presents for evaluation prior to chemo.  Overall she tolerated last treatment well.  Mild nausea and antiemetics helps.  Appetite has decreased.   has been off Ozempic. Patient takes pain medication and feels that pain is well-controlled.   + left toe tingling at night.    MEDICAL HISTORY:  Past Medical History:  Diagnosis Date   Anemia    Asthma    Back pain    Cataract    Diabetes mellitus without complication (HCC)    GERD (gastroesophageal reflux disease)    Hypertension    Morbid obesity with BMI of 60.0-69.9, adult (HCC)    Restless leg syndrome     SURGICAL HISTORY: Past Surgical History:  Procedure Laterality Date   ABDOMINAL HYSTERECTOMY     CATARACT EXTRACTION W/PHACO Left 04/13/2020   Procedure: CATARACT EXTRACTION  PHACO AND INTRAOCULAR LENS PLACEMENT (IOC);  Surgeon: Ola Berger, MD;  Location: [facility] ORS;  Service: Ophthalmology;  Laterality: Left;  US  00:36.0 CDE 5.95 Fluid Pack lot # [id number]   HYSTEROSCOPY WITH D & C N/A 07/14/2017   Procedure: DILATATION AND CURETTAGE /HYSTEROSCOPY;  Surgeon: Alben Alma, MD;  Location: [facility] ORS;  Service: Gynecology;  Laterality: N/A;   POLYPECTOMY  2015    SOCIAL HISTORY: Social History   Socioeconomic History   Marital status: Single    Spouse name: Not on file   Number of children: Not on file   Years of education: Not on file   Highest education  level: Not on file  Occupational History   Not on file  Tobacco Use   Smoking status: Never   Smokeless tobacco: Never  Vaping Use   Vaping status: Never Used  Substance and Sexual Activity   Alcohol use: No   Drug use: No   Sexual activity: Never    Birth control/protection: None  Other Topics Concern   Not on file  Social History Narrative   Not on file   Social Drivers of Health   Financial Resource Strain: Low Risk  (09/26/2022)   Received from [facility], [facility]   Overall Financial Resource Strain (CARDIA)    Difficulty of Paying Living Expenses: Not hard at all  Food Insecurity: No Food Insecurity (11/06/2023)   Hunger Vital Sign    Worried About Running Out of Food in the Last Year: Never true    Ran Out of Food in the Last Year: Never true  Transportation Needs: No Transportation Needs (11/06/2023)   PRAPARE - Administrator, Civil Service (Medical): No    Lack of Transportation (Non-Medical): No  Recent Concern: Transportation Needs - Unmet Transportation Needs (09/28/2023)   PRAPARE - Administrator, Civil Service (Medical): Yes    Lack of Transportation (Non-Medical): Yes  Physical Activity: Not on file  Stress: Not on file  Social Connections: Socially Isolated (11/06/2023)   Social Connection and Isolation Panel [NHANES]    Frequency of Communication with Friends and Family: More than three times a week    Frequency of Social Gatherings with Friends and Family: More than three times a week    Attends Religious Services: Never    Database administrator or Organizations: No    Attends Banker Meetings: Never    Marital Status: Never married  Intimate Partner Violence: Not At Risk (11/06/2023)   Humiliation, Afraid, Rape, and Kick questionnaire    Fear of Current or Ex-Partner: No    Emotionally Abused: No    Physically Abused: No    Sexually Abused: No    FAMILY HISTORY: Family  History  Problem Relation Age of Onset   Breast cancer Mother 48   Diabetes Mother    Hypertension Mother    Ovarian cancer Paternal Aunt        ?   Diabetes Father    Hypertension Father     ALLERGIES:  is allergic to peanut-containing drug products.  MEDICATIONS:  Current Outpatient Medications  Medication Sig Dispense Refill   acetaminophen  (TYLENOL  8 HOUR ARTHRITIS PAIN) 650 MG CR tablet Take 3 tablets (1,950 mg total) by mouth every 8 (eight) hours as needed for pain. Do not exceed 4000 mg total in a day.  Home med.     amLODipine  (NORVASC ) 10 MG tablet Take 1 tablet (10 mg  total) by mouth daily. 30 tablet 0   azelastine (ASTELIN) 0.1 % nasal spray Place 1 spray into both nostrils 2 (two) times daily.     EPINEPHrine  0.3 mg/0.3 mL IJ SOAJ injection Inject 0.3 mg into the muscle as needed for anaphylaxis. 1 each 0   fluticasone  (FLONASE ) 50 MCG/ACT nasal spray Place 1 spray into both nostrils daily. 9.9 mL 0   fluticasone  furoate-vilanterol (BREO ELLIPTA ) 100-25 MCG/ACT AEPB Inhale 1 puff into the lungs daily. 28 each 1   furosemide  (LASIX ) 40 MG tablet Take 40 mg by mouth every other day.     gabapentin  (NEURONTIN ) 100 MG capsule Take 1 capsule (100 mg total) by mouth 3 (three) times daily. 30 capsule 0   ipratropium-albuterol  (DUONEB) 0.5-2.5 (3) MG/3ML SOLN Take 3 mLs by nebulization every 4 (four) hours as needed (wheezing / shortness of breath). 360 mL 1   levocetirizine (XYZAL) 5 MG tablet Take 5 mg by mouth daily.     metFORMIN  (GLUCOPHAGE ) 1000 MG tablet Take 1,000 mg by mouth 2 (two) times daily.     morphine  (MS CONTIN ) 15 MG 12 hr tablet Take 1 tablet (15 mg total) by mouth every 12 (twelve) hours. 30 tablet 0   naloxone  (NARCAN ) nasal spray 4 mg/0.1 mL SPRAY 1 SPRAY INTO ONE NOSTRIL AS DIRECTED FOR OPIOID OVERDOSE (TURN PERSON ON SIDE AFTER DOSE. IF NO RESPONSE IN 2-3 MINUTES OR PERSON RESPONDS BUT RELAPSES, REPEAT USING A NEW SPRAY DEVICE AND SPRAY INTO THE OTHER NOSTRIL.  CALL 911 AFTER USE.) * EMERGENCY USE ONLY * 1 each 0   ondansetron  (ZOFRAN ) 8 MG tablet Take 1 tablet (8 mg total) by mouth every 8 (eight) hours as needed for nausea or vomiting. 30 tablet 1   oxyCODONE  (OXY IR/ROXICODONE ) 5 MG immediate release tablet Take 1 tablet (5 mg total) by mouth every 4 (four) hours as needed for moderate pain (pain score 4-6) or severe pain (pain score 7-10). 90 tablet 0   potassium chloride  SA (KLOR-CON  M) 20 MEQ tablet Take 1 tablet (20 mEq total) by mouth daily. 3 tablet 0   prochlorperazine  (COMPAZINE ) 10 MG tablet Take 1 tablet (10 mg total) by mouth every 6 (six) hours as needed for nausea or vomiting. 30 tablet 1   rOPINIRole  (REQUIP ) 2 MG tablet Take 2 mg by mouth in the morning and at bedtime.      Semaglutide, 1 MG/DOSE, 4 MG/3ML SOPN Inject 1 mg into the skin once a week.     VENTOLIN  HFA 108 (90 Base) MCG/ACT inhaler Inhale 2 puffs into the lungs every 4 (four) hours as needed for wheezing or shortness of breath. 18 g 1   No current facility-administered medications for this visit.   Facility-Administered Medications Ordered in Other Visits  Medication Dose Route Frequency Provider Last Rate Last Admin   0.9 %  sodium chloride  infusion   Intravenous Continuous [name], MD 10 mL/hr at 12/11/23 0930 New Bag at 12/11/23 0930   gemcitabine  (GEMZAR ) 2,318 mg in sodium chloride  0.9 % 250 mL chemo infusion  1,000 mg/m2 (Treatment Plan Recorded) Intravenous Once [name], MD       PACLitaxel -protein bound (ABRAXANE ) chemo infusion 300 mg  125 mg/m2 (Treatment Plan Recorded) Intravenous Once Zimri Brennen, MD 120 mL/hr at 12/11/23 1009 300 mg at 12/11/23 1009    Review of Systems  Constitutional:  Positive for fatigue. Negative for appetite change, chills and fever.  HENT:   Negative for hearing loss and voice  change.   Eyes:  Negative for eye problems.  Respiratory:  Negative for chest tightness and cough.   Cardiovascular:  Negative for chest pain.   Gastrointestinal:  Positive for abdominal pain. Negative for abdominal distention and blood in stool.  Endocrine: Negative for hot flashes.  Genitourinary:  Negative for difficulty urinating and frequency.   Musculoskeletal:  Negative for arthralgias.  Skin:  Negative for itching and rash.  Neurological:  Negative for extremity weakness.  Hematological:  Negative for adenopathy.  Psychiatric/Behavioral:  Negative for confusion.      PHYSICAL EXAMINATION: ECOG PERFORMANCE STATUS: 1 - Symptomatic but completely ambulatory  Vitals:   12/11/23 0848  BP: (!) 140/76  Pulse: 82  Resp: 18  Temp: (!) 96.4 F (35.8 C)  SpO2: 96%   Filed Weights   12/11/23 0848  Weight: 258 lb 9.6 oz (117.3 kg)    Physical Exam Constitutional:      General: She is not in acute distress.    Appearance: She is obese. She is not diaphoretic.  HENT:     Head: Normocephalic and atraumatic.  Eyes:     General: No scleral icterus. Cardiovascular:     Rate and Rhythm: Normal rate and regular rhythm.     Heart sounds: No murmur heard. Pulmonary:     Effort: Pulmonary effort is normal. No respiratory distress.     Breath sounds: No wheezing.     Comments: Decreased breath sound bilaterally Abdominal:     General: Bowel sounds are normal. There is no distension.     Palpations: Abdomen is soft.  Musculoskeletal:        General: Normal range of motion.     Cervical back: Normal range of motion and neck supple.  Skin:    General: Skin is warm and dry.     Findings: No erythema.  Neurological:     Mental Status: She is alert and oriented to person, place, and time. Mental status is at baseline.     Motor: No abnormal muscle tone.  Psychiatric:        Mood and Affect: Mood and affect normal.      LABORATORY DATA:  I have reviewed the data as listed    Latest Ref Rng & Units 12/11/2023    8:34 AM 12/04/2023    8:50 AM 11/27/2023   10:53 AM  CBC  WBC 4.0 - 10.5 K/uL 3.2  4.9  7.4   Hemoglobin  12.0 - 15.0 g/dL 11.9  14.7  82.9   Hematocrit 36.0 - 46.0 % 34.6  37.2  41.8   Platelets 150 - 400 K/uL 117  143  241       Latest Ref Rng & Units 12/11/2023    8:35 AM 12/04/2023    8:49 AM 11/27/2023   10:53 AM  CMP  Glucose 70 - 99 mg/dL 562  130  865   BUN 8 - 23 mg/dL 9  8  9    Creatinine 0.44 - 1.00 mg/dL 7.84  6.96  2.95   Sodium 135 - 145 mmol/L 136  138  137   Potassium 3.5 - 5.1 mmol/L 3.2  3.5  4.0   Chloride 98 - 111 mmol/L 95  100  97   CO2 22 - 32 mmol/L 30  26  28    Calcium  8.9 - 10.3 mg/dL 8.6  8.8  9.0   Total Protein 6.5 - 8.1 g/dL 7.5  7.2  7.6   Total Bilirubin 0.0 - 1.2  mg/dL 0.5  0.7  1.0   Alkaline Phos 38 - 126 U/L 81  77  85   AST 15 - 41 U/L 26  15  13    ALT 0 - 44 U/L 20  12  9       RADIOGRAPHIC STUDIES: I have personally reviewed the radiological images as listed and agreed with the findings in the report. No results found.

## 2023-12-11 NOTE — Assessment & Plan Note (Addendum)
 Imaging findings and different lesion biopsy results were reviewed and discussed with patient. 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  Other differential diagnosis include GI tract adenocarcinoma, cholangiocarcinoma etc. CEA is elevated.  His CA 19-9 is normal. CT and MRI imaging is did not show any GI primary.I recommend a PET scan for further evaluation- patient rescheduled appt.   Labs are reviewed and discussed with patient. Proceed with cycle 1 D15 Gemcitabine /Abraxane 

## 2023-12-11 NOTE — Assessment & Plan Note (Signed)
 Recommend potassium 20meq daily x 3

## 2023-12-11 NOTE — Assessment & Plan Note (Signed)
 Chemotherapy plan as listed above

## 2023-12-11 NOTE — Assessment & Plan Note (Signed)
 Recommend patient to continue OxyContin  10 mg every 12 hours, plus oxycodone  5 mg every 4-6 hours as needed - refilled.  Kristina Cruz

## 2023-12-11 NOTE — Assessment & Plan Note (Signed)
 Recommend gabapentin  100mg  TID PRN.

## 2023-12-12 LAB — CEA: CEA: 117 ng/mL — ABNORMAL HIGH (ref 0.0–4.7)

## 2023-12-16 ENCOUNTER — Other Ambulatory Visit: Payer: Self-pay | Admitting: Radiology

## 2023-12-16 ENCOUNTER — Encounter: Attending: Oncology

## 2023-12-16 NOTE — Progress Notes (Signed)
 Patient for IR Port Placement on Thurs 12/17/23, I called and spoke with the patient on the phone and gave pre-procedure instructions. Pt was made aware to be here at 10a, NPO after MN prior to procedure as well as driver post procedure/recovery/discharge. Pt stated understanding.  Called 12/16/23

## 2023-12-16 NOTE — H&P (Signed)
 Chief Complaint: Pancreatic cancer with metastasis to liver - IR consulted for port-a-catheter placement for chemotherapy  Referring Provider(s): [name], MD  Supervising Physician: Elene Griffes  Patient Status: [facility] - Out-pt  History of Present Illness: Kristina Cruz is a 63 y.o. female presenting to IR service for port-a-catheter placement for chemotherapy secondary to pancreatic cancer with metastasis. Pt initially found to have pancreatic mass in the proximal to mid tail, 3.9 x 5.5 cm, on CT abd/pelvis on 08/21/23. Then had follow up MRI abd showing similar indeterminate pancreatic mass. She returned to the ED on 11/05/23 with abd pain, N/V and found to have multiple liver lesions and splenic lesion in additional to pancreatic mass. Had US  guided biopsy of liver lesions on 11/09/23 confirming poorly differentiated pancreatic adenocarcinoma. Pt established with Dr. [name] with oncology, now referred to IR for port-a-catheter placement to begin chemotherapy.   Past Medical History:  Diagnosis Date   Anemia    Asthma    Back pain    Cataract    Diabetes mellitus without complication (HCC)    GERD (gastroesophageal reflux disease)    Hypertension    Morbid obesity with BMI of 60.0-69.9, adult (HCC)    Restless leg syndrome     Past Surgical History:  Procedure Laterality Date   ABDOMINAL HYSTERECTOMY     CATARACT EXTRACTION W/PHACO Left 04/13/2020   Procedure: CATARACT EXTRACTION PHACO AND INTRAOCULAR LENS PLACEMENT (IOC);  Surgeon: Ola Berger, MD;  Location: [facility] ORS;  Service: Ophthalmology;  Laterality: Left;  US  00:36.0 CDE 5.95 Fluid Pack lot # [id number]   HYSTEROSCOPY WITH D & C N/A 07/14/2017   Procedure: DILATATION AND CURETTAGE /HYSTEROSCOPY;  Surgeon: Alben Alma, MD;  Location: [facility] ORS;  Service: Gynecology;  Laterality: N/A;   POLYPECTOMY  2015    Allergies: Peanut-containing drug products  Medications: Prior to Admission medications   Medication  Sig Start Date End Date Taking? Authorizing Provider  acetaminophen  (TYLENOL  8 HOUR ARTHRITIS PAIN) 650 MG CR tablet Take 3 tablets (1,950 mg total) by mouth every 8 (eight) hours as needed for pain. Do not exceed 4000 mg total in a day.  Home med. 04/21/23   Garrison Kanner, MD  amLODipine  (NORVASC ) 10 MG tablet Take 1 tablet (10 mg total) by mouth daily. 11/10/23 12/11/23  Tiajuana Fluke, MD  azelastine (ASTELIN) 0.1 % nasal spray Place 1 spray into both nostrils 2 (two) times daily. 10/22/23   [provider]  EPINEPHrine  0.3 mg/0.3 mL IJ SOAJ injection Inject 0.3 mg into the muscle as needed for anaphylaxis. 09/19/20   Loman Risk, MD  fluticasone  (FLONASE ) 50 MCG/ACT nasal spray Place 1 spray into both nostrils daily. 09/29/23   Patel, Sona, MD  fluticasone  furoate-vilanterol (BREO ELLIPTA ) 100-25 MCG/ACT AEPB Inhale 1 puff into the lungs daily. 09/29/23   Patel, Sona, MD  furosemide  (LASIX ) 40 MG tablet Take 40 mg by mouth every other day. 03/23/20   [provider]  gabapentin  (NEURONTIN ) 100 MG capsule Take 1 capsule (100 mg total) by mouth 3 (three) times daily. 12/11/23   [name], MD  ipratropium-albuterol  (DUONEB) 0.5-2.5 (3) MG/3ML SOLN Take 3 mLs by nebulization every 4 (four) hours as needed (wheezing / shortness of breath). 09/29/23   Patel, Sona, MD  levocetirizine (XYZAL) 5 MG tablet Take 5 mg by mouth daily. 10/21/23   [provider]  metFORMIN  (GLUCOPHAGE ) 1000 MG tablet Take 1,000 mg by mouth 2 (two) times daily. 11/27/22   [provider]  morphine  (MS CONTIN ) 15 MG 12 hr tablet Take 1 tablet (15 mg total) by mouth every 12 (twelve) hours. 11/20/23   Borders, Carlene Che, NP  naloxone  (NARCAN ) nasal spray 4 mg/0.1 mL SPRAY 1 SPRAY INTO ONE NOSTRIL AS DIRECTED FOR OPIOID OVERDOSE (TURN PERSON ON SIDE AFTER DOSE. IF NO RESPONSE IN 2-3 MINUTES OR PERSON RESPONDS BUT RELAPSES, REPEAT USING A NEW SPRAY DEVICE AND SPRAY INTO THE OTHER NOSTRIL. CALL 911 AFTER USE.) *  EMERGENCY USE ONLY * 11/20/23   Borders, Carlene Che, NP  ondansetron  (ZOFRAN ) 8 MG tablet Take 1 tablet (8 mg total) by mouth every 8 (eight) hours as needed for nausea or vomiting. 11/17/23   [name], MD  oxyCODONE  (OXY IR/ROXICODONE ) 5 MG immediate release tablet Take 1 tablet (5 mg total) by mouth every 4 (four) hours as needed for moderate pain (pain score 4-6) or severe pain (pain score 7-10). 12/04/23   [name], MD  potassium chloride  SA (KLOR-CON  M) 20 MEQ tablet Take 1 tablet (20 mEq total) by mouth daily. 12/11/23   [name], MD  prochlorperazine  (COMPAZINE ) 10 MG tablet Take 1 tablet (10 mg total) by mouth every 6 (six) hours as needed for nausea or vomiting. 11/17/23   [name], MD  rOPINIRole  (REQUIP ) 2 MG tablet Take 2 mg by mouth in the morning and at bedtime.     [provider]  Semaglutide, 1 MG/DOSE, 4 MG/3ML SOPN Inject 1 mg into the skin once a week. 10/29/23   [provider]  VENTOLIN  HFA 108 (90 Base) MCG/ACT inhaler Inhale 2 puffs into the lungs every 4 (four) hours as needed for wheezing or shortness of breath. 09/29/23   Melvinia Stager, MD     Family History  Problem Relation Age of Onset   Breast cancer Mother 64   Diabetes Mother    Hypertension Mother    Ovarian cancer Paternal Aunt        ?   Diabetes Father    Hypertension Father     Social History   Socioeconomic History   Marital status: Single    Spouse name: Not on file   Number of children: Not on file   Years of education: Not on file   Highest education level: Not on file  Occupational History   Not on file  Tobacco Use   Smoking status: Never   Smokeless tobacco: Never  Vaping Use   Vaping status: Never Used  Substance and Sexual Activity   Alcohol use: No   Drug use: No   Sexual activity: Never    Birth control/protection: None  Other Topics Concern   Not on file  Social History Narrative   Not on file   Social Drivers of Health   Financial Resource Strain: Low Risk   (09/26/2022)   Received [facility], [facility]   Overall Financial Resource Strain (CARDIA)    Difficulty of Paying Living Expenses: Not hard at all  Food Insecurity: No Food Insecurity (11/06/2023)   Hunger Vital Sign    Worried About Running Out of Food in the Last Year: Never true    Ran Out of Food in the Last Year: Never true  Transportation Needs: No Transportation Needs (11/06/2023)   PRAPARE - Administrator, Civil Service (Medical): No    Lack of Transportation (Non-Medical): No  Recent Concern: Transportation Needs - Unmet Transportation Needs (09/28/2023)   PRAPARE - Transportation  Lack of Transportation (Medical): Yes    Lack of Transportation (Non-Medical): Yes  Physical Activity: Not on file  Stress: Not on file  Social Connections: Socially Isolated (11/06/2023)   Social Connection and Isolation Panel [NHANES]    Frequency of Communication with Friends and Family: More than three times a week    Frequency of Social Gatherings with Friends and Family: More than three times a week    Attends Religious Services: Never    Database administrator or Organizations: No    Attends Banker Meetings: Never    Marital Status: Never married     Review of Systems: A 12 point ROS discussed and pertinent positives are indicated in the HPI above.  All other systems are negative.  Review of Systems  Constitutional:  Negative for appetite change and fatigue.  Respiratory:  Negative for cough and shortness of breath.   Cardiovascular:  Negative for chest pain and leg swelling.  Gastrointestinal:  Positive for diarrhea, nausea and vomiting. Negative for abdominal pain.  Neurological:  Positive for dizziness. Negative for headaches.    Vital Signs: BP 103/66   Pulse (!) 130   Temp 97.7 F (36.5 C) (Oral)   Resp 17   Wt 258 lb 9.6 oz (117.3 kg)   LMP 01/04/2018   SpO2 94%   BMI 47.30 kg/m   Advance Care Plan:  The advanced care place/surrogate decision maker was discussed at the time of visit and the patient did not wish to discuss or was not able to name a surrogate decision maker or provide an advance care plan.  Physical Exam Constitutional:      General: She is not in acute distress.    Appearance: She is obese.  HENT:     Mouth/Throat:     Mouth: Mucous membranes are moist.     Pharynx: Oropharynx is clear.  Cardiovascular:     Rate and Rhythm: Regular rhythm. Tachycardia present.  Pulmonary:     Effort: Pulmonary effort is normal.  Abdominal:     Tenderness: There is no abdominal tenderness.  Skin:    General: Skin is warm and dry.  Neurological:     Mental Status: She is alert and oriented to person, place, and time.     Imaging: No results found.  Labs:  CBC: Recent Labs    11/09/23 0503 11/27/23 1053 12/04/23 0850 12/11/23 0834  WBC 8.1 7.4 4.9 3.2*  HGB 12.5 13.3 12.0 11.2*  HCT 38.8 41.8 37.2 34.6*  PLT 248 241 143* 117*    COAGS: Recent Labs    11/09/23 0503  INR 1.1    BMP: Recent Labs    11/06/23 0449 11/27/23 1053 12/04/23 0849 12/11/23 0835  NA 138 137 138 136  K 3.7 4.0 3.5 3.2*  CL 100 97* 100 95*  CO2 30 28 26 30   GLUCOSE 275* 182* 188* 180*  BUN 11 9 8 9   CALCIUM  9.1 9.0 8.8* 8.6*  CREATININE 0.78 0.84 0.92 0.88  GFRNONAA >60 >60 >60 >60    LIVER FUNCTION TESTS: Recent Labs    11/06/23 0449 11/27/23 1053 12/04/23 0849 12/11/23 0835  BILITOT 0.8 1.0 0.7 0.5  AST 13* 13* 15 26  ALT 10 9 12 20   ALKPHOS 77 85 77 81  PROT 7.2 7.6 7.2 7.5  ALBUMIN  3.6 4.0 3.5 3.3*    TUMOR MARKERS: No results for input(s): "AFPTM", "CEA", "CA199", "CHROMGRNA" in the last 8760 hours.  Assessment and Plan:  Sharnetta Ferraris is a 63 y.o. female presenting to IR service for port-a-catheter placement for chemotherapy secondary to pancreatic cancer with metastasis to liver. Pt initially found to have pancreatic mass in the proximal to mid tail,  3.9 x 5.5 cm, on CT abd/pelvis on 08/21/23. Then had MRI abd showing indeterminate pancreatic mass. She returned to the ED on 11/05/23 with abd pain, N/V and found to have multiple liver lesions and splenic lesion. Had US  guided biopsy on 11/09/23 confirming poorly differentiated adenocarcinoma. Pt established with Dr. [name] with oncology, now referred to Ir for port-a-catheter placement to begin chemotherapy.  Patient is tachycardic with mild lethargy. She is alert, oriented and able to answer questions appropriately and participate in the conversation. She states she has begun chemotherapy and had a treatment last week. She has been having diarrhea. She also states she doesn't have anyone that can stay with her this evening/overnight. Dr. Julietta Ogren discussed placing a PICC line as a safer alternative (no sedation) to a port-a-catheter and the patient requested to have the port-a-catheter placed.   IR will plan to give patient a bolus of normal saline and the procedure will be performed using only one sedating agent. The patient is agreeable to this plan.   Risks and benefits of image-guided Port-a-catheter placement were discussed with the patient including, but not limited to bleeding, infection, pneumothorax, or fibrin sheath development and need for additional procedures.  All of the patient's questions were answered, patient is agreeable to proceed.  Consent signed and in chart.  Thank you for allowing our service to participate in Dante Biedron 's care.  Electronically Signed: Fawn Hooks, NP   12/17/2023, 10:50 AM      I spent a total of  30 Minutes   in face to face in clinical consultation, greater than 50% of which was counseling/coordinating care for port-a-catheter placement.

## 2023-12-17 ENCOUNTER — Other Ambulatory Visit: Payer: Self-pay

## 2023-12-17 ENCOUNTER — Ambulatory Visit
Admission: RE | Admit: 2023-12-17 | Discharge: 2023-12-17 | Disposition: A | Discharge status: Home / Self Care | Source: Ambulatory Visit | Attending: Oncology | Admitting: Oncology

## 2023-12-17 VITALS — BP 125/90 | HR 132 | Temp 97.7°F | Resp 21 | Wt 258.6 lb

## 2023-12-17 DIAGNOSIS — C787 Secondary malignant neoplasm of liver and intrahepatic bile duct: Secondary | ICD-10-CM | POA: Insufficient documentation

## 2023-12-17 DIAGNOSIS — K8689 Other specified diseases of pancreas: Secondary | ICD-10-CM

## 2023-12-17 DIAGNOSIS — C259 Malignant neoplasm of pancreas, unspecified: Secondary | ICD-10-CM | POA: Diagnosis present

## 2023-12-17 LAB — GLUCOSE, CAPILLARY: Glucose-Capillary: 273 mg/dL — ABNORMAL HIGH (ref 70–99)

## 2023-12-17 MED ORDER — LIDOCAINE HCL 1 % IJ SOLN
INTRAMUSCULAR | Status: AC
  Filled 2023-12-17: qty 20

## 2023-12-17 MED ORDER — LIDOCAINE-EPINEPHRINE 1 %-1:100000 IJ SOLN
8.0000 mL | Freq: Once | INTRAMUSCULAR | Status: AC
  Administered 2023-12-17: 8 mL via INTRADERMAL

## 2023-12-17 MED ORDER — HEPARIN SOD (PORK) LOCK FLUSH 100 UNIT/ML IV SOLN
INTRAVENOUS | Status: AC
  Filled 2023-12-17: qty 5

## 2023-12-17 MED ORDER — PROCHLORPERAZINE EDISYLATE 10 MG/2ML IJ SOLN
INTRAMUSCULAR | Status: AC
  Filled 2023-12-17: qty 2

## 2023-12-17 MED ORDER — FENTANYL CITRATE (PF) 100 MCG/2ML IJ SOLN
INTRAMUSCULAR | Status: AC
  Filled 2023-12-17: qty 2

## 2023-12-17 MED ORDER — ONDANSETRON HCL 4 MG/2ML IJ SOLN
INTRAMUSCULAR | Status: AC | PRN
  Administered 2023-12-17: 4 mg via INTRAVENOUS

## 2023-12-17 MED ORDER — HEPARIN SOD (PORK) LOCK FLUSH 100 UNIT/ML IV SOLN
500.0000 [IU] | Freq: Once | INTRAVENOUS | Status: AC
  Administered 2023-12-17: 500 [IU] via INTRAVENOUS

## 2023-12-17 MED ORDER — PROCHLORPERAZINE EDISYLATE 10 MG/2ML IJ SOLN
10.0000 mg | Freq: Once | INTRAMUSCULAR | Status: AC
  Administered 2023-12-17: 10 mg via INTRAVENOUS
  Filled 2023-12-17: qty 2

## 2023-12-17 MED ORDER — SODIUM CHLORIDE 0.9 % IV SOLN: INTRAVENOUS | Status: DC

## 2023-12-17 MED ORDER — LIDOCAINE-EPINEPHRINE 1 %-1:100000 IJ SOLN
INTRAMUSCULAR | Status: AC
  Filled 2023-12-17: qty 1

## 2023-12-17 MED ORDER — FENTANYL CITRATE (PF) 100 MCG/2ML IJ SOLN
INTRAMUSCULAR | Status: AC | PRN
  Administered 2023-12-17 (×2): 25 ug via INTRAVENOUS

## 2023-12-17 MED ORDER — ONDANSETRON HCL 4 MG/2ML IJ SOLN
INTRAMUSCULAR | Status: AC
  Filled 2023-12-17: qty 2

## 2023-12-17 MED ORDER — SODIUM CHLORIDE 0.9 % IV BOLUS
250.0000 mL | Freq: Once | INTRAVENOUS | Status: AC
  Administered 2023-12-17: 250 mL via INTRAVENOUS

## 2023-12-17 MED ORDER — LIDOCAINE HCL 1 % IJ SOLN
15.0000 mL | Freq: Once | INTRAMUSCULAR | Status: AC
  Administered 2023-12-17: 15 mL via INTRADERMAL

## 2023-12-17 NOTE — Procedures (Signed)
Interventional Radiology Procedure:   Indications: Metastatic pancreatic cancer  Procedure: Port placement  Findings: Right jugular port, tip at SVC/RA junction  Complications: None     EBL: Minimal, less than 10 ml  Plan: Discharge in one hour.  Keep port site and incisions dry for at least 24 hours.     Alecsander Hattabaugh R. Anselm Pancoast, MD  Pager: [phone]

## 2023-12-17 NOTE — Progress Notes (Signed)
 Patient clinically unchanged post IR Port placement per Dr Julietta Ogren, although with fluid bolus, post procedure, patient more alert, wanting po's post procedure. Bp stable. Still remains sinus tachycardia from pre procedure.

## 2023-12-18 ENCOUNTER — Other Ambulatory Visit: Payer: Self-pay | Admitting: Nurse Practitioner

## 2023-12-18 ENCOUNTER — Telehealth: Payer: Self-pay | Admitting: *Deleted

## 2023-12-18 MED ORDER — DIPHENHYDRAMINE HCL 12.5 MG/5ML PO LIQD: 5.0000 mL | Freq: Four times a day (QID) | ORAL | 1 refills | Status: DC | PRN

## 2023-12-18 NOTE — Telephone Encounter (Signed)
 The patient called in saying that she has a sore throat and she wants to know what to take to help it as well as she has diarrhea but she says that she knows exactly what pill that she has to get for the diarrhea so the only problem she needs is what to do with the sore throat.  I called her back to see if she has used anything for her sore throat and she said no.  But she thinks that over-the-counter's is not going to take care of it/

## 2023-12-18 NOTE — Progress Notes (Signed)
 Patient complaining of sore throat since chemo. No ulcerations or coatings seen. Plan to start magic mouthwash, swish and swallow. If symptoms don't improve, consider covering with anti-fungals.

## 2023-12-21 ENCOUNTER — Other Ambulatory Visit: Payer: Self-pay | Admitting: Nurse Practitioner

## 2023-12-21 ENCOUNTER — Telehealth: Payer: Self-pay | Admitting: *Deleted

## 2023-12-21 MED ORDER — DIPHENOXYLATE-ATROPINE 2.5-0.025 MG PO TABS: 2.0000 | ORAL_TABLET | Freq: Four times a day (QID) | ORAL | 0 refills | Status: DC | PRN

## 2023-12-21 NOTE — Telephone Encounter (Signed)
 As of today she says that she is having diarrhea after diarrhea the Imodium would only work for 2 days and she wants to have something else to take care of her diarrhea she is getting weak.  She says that she took some chicken noodle soup out of a can and she is going again with all of the diarrhea she wants a stronger medicine to help with the diarrhea.  Drinking water. I spoke to Lauren and she says that she would be putting in a prescription for diarrhea medicine.  I spoke to the patient and I told her that if she was not getting better with the diarrhea by noon we would like her to call us  back she might have to to come in to be seen if she does not get better

## 2023-12-21 NOTE — Progress Notes (Signed)
 Patient called clinic complaining of diarrhea unrelieved by imodium. Prescription for lomotil sent to pharmacy. If symptoms worsening or unrelieved, recommend in person evaluation with stool studies.

## 2023-12-25 ENCOUNTER — Encounter: Payer: Self-pay | Admitting: Emergency Medicine

## 2023-12-25 ENCOUNTER — Inpatient Hospital Stay
Admission: EM | Admit: 2023-12-25 | Discharge: 2023-12-30 | DRG: 872 | Disposition: A | Discharge status: Home Health | Attending: Internal Medicine | Admitting: Internal Medicine

## 2023-12-25 ENCOUNTER — Other Ambulatory Visit: Payer: Self-pay

## 2023-12-25 ENCOUNTER — Inpatient Hospital Stay (HOSPITAL_BASED_OUTPATIENT_CLINIC_OR_DEPARTMENT_OTHER): Admitting: Oncology

## 2023-12-25 ENCOUNTER — Inpatient Hospital Stay: Admit: 2023-12-25

## 2023-12-25 ENCOUNTER — Encounter: Payer: Self-pay | Admitting: Oncology

## 2023-12-25 ENCOUNTER — Emergency Department

## 2023-12-25 ENCOUNTER — Inpatient Hospital Stay

## 2023-12-25 ENCOUNTER — Inpatient Hospital Stay: Attending: Oncology

## 2023-12-25 VITALS — BP 107/76 | HR 140 | Temp 97.5°F | Resp 15

## 2023-12-25 DIAGNOSIS — I509 Heart failure, unspecified: Secondary | ICD-10-CM | POA: Diagnosis present

## 2023-12-25 DIAGNOSIS — Z79899 Other long term (current) drug therapy: Secondary | ICD-10-CM

## 2023-12-25 DIAGNOSIS — E118 Type 2 diabetes mellitus with unspecified complications: Secondary | ICD-10-CM

## 2023-12-25 DIAGNOSIS — N179 Acute kidney failure, unspecified: Secondary | ICD-10-CM | POA: Insufficient documentation

## 2023-12-25 DIAGNOSIS — R160 Hepatomegaly, not elsewhere classified: Secondary | ICD-10-CM

## 2023-12-25 DIAGNOSIS — I4891 Unspecified atrial fibrillation: Secondary | ICD-10-CM | POA: Insufficient documentation

## 2023-12-25 DIAGNOSIS — R600 Localized edema: Secondary | ICD-10-CM | POA: Diagnosis present

## 2023-12-25 DIAGNOSIS — A419 Sepsis, unspecified organism: Secondary | ICD-10-CM | POA: Diagnosis present

## 2023-12-25 DIAGNOSIS — K521 Toxic gastroenteritis and colitis: Secondary | ICD-10-CM | POA: Diagnosis present

## 2023-12-25 DIAGNOSIS — Z961 Presence of intraocular lens: Secondary | ICD-10-CM | POA: Diagnosis present

## 2023-12-25 DIAGNOSIS — E8809 Other disorders of plasma-protein metabolism, not elsewhere classified: Secondary | ICD-10-CM | POA: Diagnosis present

## 2023-12-25 DIAGNOSIS — I110 Hypertensive heart disease with heart failure: Secondary | ICD-10-CM | POA: Diagnosis present

## 2023-12-25 DIAGNOSIS — C252 Malignant neoplasm of tail of pancreas: Secondary | ICD-10-CM | POA: Diagnosis present

## 2023-12-25 DIAGNOSIS — R6520 Severe sepsis without septic shock: Secondary | ICD-10-CM | POA: Diagnosis present

## 2023-12-25 DIAGNOSIS — R970 Elevated carcinoembryonic antigen [CEA]: Secondary | ICD-10-CM | POA: Diagnosis not present

## 2023-12-25 DIAGNOSIS — T451X5A Adverse effect of antineoplastic and immunosuppressive drugs, initial encounter: Secondary | ICD-10-CM | POA: Diagnosis present

## 2023-12-25 DIAGNOSIS — I483 Typical atrial flutter: Secondary | ICD-10-CM | POA: Diagnosis present

## 2023-12-25 DIAGNOSIS — Z515 Encounter for palliative care: Secondary | ICD-10-CM | POA: Diagnosis not present

## 2023-12-25 DIAGNOSIS — Z5982 Transportation insecurity: Secondary | ICD-10-CM

## 2023-12-25 DIAGNOSIS — Z7189 Other specified counseling: Secondary | ICD-10-CM | POA: Diagnosis not present

## 2023-12-25 DIAGNOSIS — D630 Anemia in neoplastic disease: Secondary | ICD-10-CM | POA: Diagnosis present

## 2023-12-25 DIAGNOSIS — Z8041 Family history of malignant neoplasm of ovary: Secondary | ICD-10-CM | POA: Insufficient documentation

## 2023-12-25 DIAGNOSIS — Y929 Unspecified place or not applicable: Secondary | ICD-10-CM

## 2023-12-25 DIAGNOSIS — J984 Other disorders of lung: Secondary | ICD-10-CM | POA: Diagnosis not present

## 2023-12-25 DIAGNOSIS — Z833 Family history of diabetes mellitus: Secondary | ICD-10-CM

## 2023-12-25 DIAGNOSIS — C787 Secondary malignant neoplasm of liver and intrahepatic bile duct: Secondary | ICD-10-CM

## 2023-12-25 DIAGNOSIS — C7802 Secondary malignant neoplasm of left lung: Secondary | ICD-10-CM | POA: Diagnosis present

## 2023-12-25 DIAGNOSIS — G893 Neoplasm related pain (acute) (chronic): Secondary | ICD-10-CM | POA: Diagnosis not present

## 2023-12-25 DIAGNOSIS — L89152 Pressure ulcer of sacral region, stage 2: Secondary | ICD-10-CM | POA: Diagnosis present

## 2023-12-25 DIAGNOSIS — G4700 Insomnia, unspecified: Secondary | ICD-10-CM | POA: Diagnosis not present

## 2023-12-25 DIAGNOSIS — E861 Hypovolemia: Secondary | ICD-10-CM | POA: Diagnosis present

## 2023-12-25 DIAGNOSIS — Z7984 Long term (current) use of oral hypoglycemic drugs: Secondary | ICD-10-CM

## 2023-12-25 DIAGNOSIS — C259 Malignant neoplasm of pancreas, unspecified: Secondary | ICD-10-CM

## 2023-12-25 DIAGNOSIS — Z6841 Body Mass Index (BMI) 40.0 and over, adult: Secondary | ICD-10-CM | POA: Diagnosis not present

## 2023-12-25 DIAGNOSIS — Z9842 Cataract extraction status, left eye: Secondary | ICD-10-CM

## 2023-12-25 DIAGNOSIS — I4819 Other persistent atrial fibrillation: Secondary | ICD-10-CM | POA: Diagnosis present

## 2023-12-25 DIAGNOSIS — J4489 Other specified chronic obstructive pulmonary disease: Secondary | ICD-10-CM | POA: Diagnosis present

## 2023-12-25 DIAGNOSIS — Z803 Family history of malignant neoplasm of breast: Secondary | ICD-10-CM

## 2023-12-25 DIAGNOSIS — D72829 Elevated white blood cell count, unspecified: Secondary | ICD-10-CM | POA: Insufficient documentation

## 2023-12-25 DIAGNOSIS — M160 Bilateral primary osteoarthritis of hip: Secondary | ICD-10-CM | POA: Diagnosis present

## 2023-12-25 DIAGNOSIS — Z604 Social exclusion and rejection: Secondary | ICD-10-CM | POA: Diagnosis present

## 2023-12-25 DIAGNOSIS — E1165 Type 2 diabetes mellitus with hyperglycemia: Secondary | ICD-10-CM | POA: Diagnosis present

## 2023-12-25 DIAGNOSIS — I071 Rheumatic tricuspid insufficiency: Secondary | ICD-10-CM | POA: Diagnosis present

## 2023-12-25 DIAGNOSIS — R6510 Systemic inflammatory response syndrome (SIRS) of non-infectious origin without acute organ dysfunction: Secondary | ICD-10-CM | POA: Diagnosis not present

## 2023-12-25 DIAGNOSIS — R439 Unspecified disturbances of smell and taste: Secondary | ICD-10-CM | POA: Diagnosis present

## 2023-12-25 DIAGNOSIS — G2581 Restless legs syndrome: Secondary | ICD-10-CM | POA: Diagnosis present

## 2023-12-25 DIAGNOSIS — Z789 Other specified health status: Secondary | ICD-10-CM | POA: Diagnosis not present

## 2023-12-25 DIAGNOSIS — E66813 Obesity, class 3: Secondary | ICD-10-CM | POA: Diagnosis present

## 2023-12-25 DIAGNOSIS — Z90710 Acquired absence of both cervix and uterus: Secondary | ICD-10-CM

## 2023-12-25 DIAGNOSIS — Z7985 Long-term (current) use of injectable non-insulin antidiabetic drugs: Secondary | ICD-10-CM

## 2023-12-25 DIAGNOSIS — K529 Noninfective gastroenteritis and colitis, unspecified: Secondary | ICD-10-CM

## 2023-12-25 DIAGNOSIS — J449 Chronic obstructive pulmonary disease, unspecified: Secondary | ICD-10-CM | POA: Diagnosis not present

## 2023-12-25 DIAGNOSIS — R000 Tachycardia, unspecified: Secondary | ICD-10-CM | POA: Diagnosis not present

## 2023-12-25 DIAGNOSIS — I361 Nonrheumatic tricuspid (valve) insufficiency: Secondary | ICD-10-CM | POA: Diagnosis not present

## 2023-12-25 DIAGNOSIS — Z1152 Encounter for screening for COVID-19: Secondary | ICD-10-CM

## 2023-12-25 DIAGNOSIS — Z91010 Allergy to peanuts: Secondary | ICD-10-CM

## 2023-12-25 DIAGNOSIS — E119 Type 2 diabetes mellitus without complications: Secondary | ICD-10-CM

## 2023-12-25 DIAGNOSIS — Z8249 Family history of ischemic heart disease and other diseases of the circulatory system: Secondary | ICD-10-CM

## 2023-12-25 DIAGNOSIS — L8990 Pressure ulcer of unspecified site, unspecified stage: Secondary | ICD-10-CM | POA: Insufficient documentation

## 2023-12-25 DIAGNOSIS — Z7901 Long term (current) use of anticoagulants: Secondary | ICD-10-CM

## 2023-12-25 DIAGNOSIS — I5031 Acute diastolic (congestive) heart failure: Secondary | ICD-10-CM | POA: Diagnosis not present

## 2023-12-25 DIAGNOSIS — I4510 Unspecified right bundle-branch block: Secondary | ICD-10-CM | POA: Diagnosis present

## 2023-12-25 DIAGNOSIS — R5381 Other malaise: Secondary | ICD-10-CM | POA: Diagnosis present

## 2023-12-25 HISTORY — DX: Personal history of other medical treatment: Z92.89

## 2023-12-25 LAB — COMPREHENSIVE METABOLIC PANEL WITH GFR
ALT: 15 U/L (ref 0–44)
AST: 24 U/L (ref 15–41)
Albumin: 2.7 g/dL — ABNORMAL LOW (ref 3.5–5.0)
Alkaline Phosphatase: 98 U/L (ref 38–126)
Anion gap: 14 (ref 5–15)
BUN: 29 mg/dL — ABNORMAL HIGH (ref 8–23)
CO2: 24 mmol/L (ref 22–32)
Calcium: 8.1 mg/dL — ABNORMAL LOW (ref 8.9–10.3)
Chloride: 93 mmol/L — ABNORMAL LOW (ref 98–111)
Creatinine, Ser: 1.18 mg/dL — ABNORMAL HIGH (ref 0.44–1.00)
GFR, Estimated: 51 mL/min — ABNORMAL LOW (ref >60)
Glucose, Bld: 219 mg/dL — ABNORMAL HIGH (ref 70–99)
Potassium: 3 mmol/L — ABNORMAL LOW (ref 3.5–5.1)
Sodium: 131 mmol/L — ABNORMAL LOW (ref 135–145)
Total Bilirubin: 1.5 mg/dL — ABNORMAL HIGH (ref 0.0–1.2)
Total Protein: 7 g/dL (ref 6.5–8.1)

## 2023-12-25 LAB — CMP (CANCER CENTER ONLY)
ALT: 14 U/L (ref 0–44)
AST: 25 U/L (ref 15–41)
Albumin: 2.8 g/dL — ABNORMAL LOW (ref 3.5–5.0)
Alkaline Phosphatase: 120 U/L (ref 38–126)
Anion gap: 14 (ref 5–15)
BUN: 28 mg/dL — ABNORMAL HIGH (ref 8–23)
CO2: 25 mmol/L (ref 22–32)
Calcium: 8 mg/dL — ABNORMAL LOW (ref 8.9–10.3)
Chloride: 91 mmol/L — ABNORMAL LOW (ref 98–111)
Creatinine: 1.33 mg/dL — ABNORMAL HIGH (ref 0.44–1.00)
GFR, Estimated: 44 mL/min — ABNORMAL LOW (ref >60)
Glucose, Bld: 216 mg/dL — ABNORMAL HIGH (ref 70–99)
Potassium: 3 mmol/L — ABNORMAL LOW (ref 3.5–5.1)
Sodium: 130 mmol/L — ABNORMAL LOW (ref 135–145)
Total Bilirubin: 1.6 mg/dL — ABNORMAL HIGH (ref 0.0–1.2)
Total Protein: 6.9 g/dL (ref 6.5–8.1)

## 2023-12-25 LAB — CBC WITH DIFFERENTIAL/PLATELET
Abs Immature Granulocytes: 2 10*3/uL — ABNORMAL HIGH (ref 0.00–0.07)
Basophils Absolute: 0 10*3/uL (ref 0.0–0.1)
Basophils Relative: 0 %
Eosinophils Absolute: 0 10*3/uL (ref 0.0–0.5)
Eosinophils Relative: 0 %
HCT: 31.3 % — ABNORMAL LOW (ref 36.0–46.0)
Hemoglobin: 10.7 g/dL — ABNORMAL LOW (ref 12.0–15.0)
Lymphocytes Relative: 16 %
Lymphs Abs: 3.9 10*3/uL (ref 0.7–4.0)
MCH: 27.9 pg (ref 26.0–34.0)
MCHC: 34.2 g/dL (ref 30.0–36.0)
MCV: 81.5 fL (ref 80.0–100.0)
Metamyelocytes Relative: 3 %
Monocytes Absolute: 3 10*3/uL — ABNORMAL HIGH (ref 0.1–1.0)
Monocytes Relative: 12 %
Myelocytes: 3 %
Neutro Abs: 15.7 10*3/uL — ABNORMAL HIGH (ref 1.7–7.7)
Neutrophils Relative %: 64 %
Platelets: 164 10*3/uL (ref 150–400)
Promyelocytes Relative: 2 %
RBC: 3.84 MIL/uL — ABNORMAL LOW (ref 3.87–5.11)
RDW: 15.5 % (ref 11.5–15.5)
Smear Review: NORMAL
WBC: 24.6 10*3/uL — ABNORMAL HIGH (ref 4.0–10.5)
nRBC: 2.3 % — ABNORMAL HIGH (ref 0.0–0.2)

## 2023-12-25 LAB — TROPONIN I (HIGH SENSITIVITY)
Troponin I (High Sensitivity): 12 ng/L (ref <18)
Troponin I (High Sensitivity): 14 ng/L (ref <18)

## 2023-12-25 LAB — CBC WITH DIFFERENTIAL (CANCER CENTER ONLY)
Abs Immature Granulocytes: 5.22 10*3/uL — ABNORMAL HIGH (ref 0.00–0.07)
Basophils Absolute: 0.1 10*3/uL (ref 0.0–0.1)
Basophils Relative: 0 %
Eosinophils Absolute: 0 10*3/uL (ref 0.0–0.5)
Eosinophils Relative: 0 %
HCT: 32 % — ABNORMAL LOW (ref 36.0–46.0)
Hemoglobin: 10.9 g/dL — ABNORMAL LOW (ref 12.0–15.0)
Immature Granulocytes: 21 %
Lymphocytes Relative: 13 %
Lymphs Abs: 3.1 10*3/uL (ref 0.7–4.0)
MCH: 27.7 pg (ref 26.0–34.0)
MCHC: 34.1 g/dL (ref 30.0–36.0)
MCV: 81.2 fL (ref 80.0–100.0)
Monocytes Absolute: 3.6 10*3/uL — ABNORMAL HIGH (ref 0.1–1.0)
Monocytes Relative: 15 %
Neutro Abs: 12.4 10*3/uL — ABNORMAL HIGH (ref 1.7–7.7)
Neutrophils Relative %: 51 %
Platelet Count: 189 10*3/uL (ref 150–400)
RBC: 3.94 MIL/uL (ref 3.87–5.11)
RDW: 15.1 % (ref 11.5–15.5)
Smear Review: NORMAL
WBC Count: 24.4 10*3/uL — ABNORMAL HIGH (ref 4.0–10.5)
nRBC: 2.4 % — ABNORMAL HIGH (ref 0.0–0.2)

## 2023-12-25 LAB — RESP PANEL BY RT-PCR (RSV, FLU A&B, COVID)  RVPGX2
Influenza A by PCR: NEGATIVE
Influenza B by PCR: NEGATIVE
Resp Syncytial Virus by PCR: NEGATIVE
SARS Coronavirus 2 by RT PCR: NEGATIVE

## 2023-12-25 LAB — PROTIME-INR
INR: 1.2 (ref 0.8–1.2)
Prothrombin Time: 15.8 s — ABNORMAL HIGH (ref 11.4–15.2)

## 2023-12-25 LAB — LACTIC ACID, PLASMA
Lactic Acid, Venous: 1.6 mmol/L (ref 0.5–1.9)
Lactic Acid, Venous: 2 mmol/L (ref 0.5–1.9)

## 2023-12-25 LAB — LIPASE, BLOOD: Lipase: 35 U/L (ref 11–51)

## 2023-12-25 LAB — BRAIN NATRIURETIC PEPTIDE: B Natriuretic Peptide: 109.6 pg/mL — ABNORMAL HIGH (ref 0.0–100.0)

## 2023-12-25 LAB — GLUCOSE, CAPILLARY: Glucose-Capillary: 260 mg/dL — ABNORMAL HIGH (ref 70–99)

## 2023-12-25 MED ORDER — FLUTICASONE FUROATE-VILANTEROL 100-25 MCG/ACT IN AEPB
1.0000 | INHALATION_SPRAY | Freq: Every day | RESPIRATORY_TRACT | Status: DC
  Filled 2023-12-25: qty 28

## 2023-12-25 MED ORDER — POTASSIUM CHLORIDE CRYS ER 20 MEQ PO TBCR
40.0000 meq | EXTENDED_RELEASE_TABLET | ORAL | Status: AC
  Administered 2023-12-25 (×2): 40 meq via ORAL
  Filled 2023-12-25 (×2): qty 2

## 2023-12-25 MED ORDER — SODIUM CHLORIDE 0.9 % IV SOLN
2.0000 g | Freq: Two times a day (BID) | INTRAVENOUS | Status: DC
  Administered 2023-12-26 (×3): 2 g via INTRAVENOUS
  Filled 2023-12-25 (×4): qty 12.5

## 2023-12-25 MED ORDER — ACETAMINOPHEN 325 MG PO TABS
650.0000 mg | ORAL_TABLET | Freq: Four times a day (QID) | ORAL | Status: DC | PRN
  Administered 2023-12-25 – 2023-12-26 (×2): 650 mg via ORAL
  Filled 2023-12-25 (×2): qty 2

## 2023-12-25 MED ORDER — ROPINIROLE HCL 1 MG PO TABS
2.0000 mg | ORAL_TABLET | Freq: Two times a day (BID) | ORAL | Status: DC
  Administered 2023-12-25 – 2023-12-30 (×10): 2 mg via ORAL
  Filled 2023-12-25 (×10): qty 2

## 2023-12-25 MED ORDER — LACTATED RINGERS IV BOLUS (SEPSIS)
600.0000 mL | Freq: Once | INTRAVENOUS | Status: AC
  Administered 2023-12-25: 600 mL via INTRAVENOUS

## 2023-12-25 MED ORDER — IOHEXOL 300 MG/ML  SOLN
100.0000 mL | Freq: Once | INTRAMUSCULAR | Status: AC | PRN
  Administered 2023-12-25: 100 mL via INTRAVENOUS

## 2023-12-25 MED ORDER — INSULIN ASPART 100 UNIT/ML IJ SOLN
0.0000 [IU] | Freq: Three times a day (TID) | INTRAMUSCULAR | Status: DC
  Administered 2023-12-26: 7 [IU] via SUBCUTANEOUS
  Administered 2023-12-26: 3 [IU] via SUBCUTANEOUS
  Administered 2023-12-26: 7 [IU] via SUBCUTANEOUS
  Administered 2023-12-27 – 2023-12-28 (×3): 3 [IU] via SUBCUTANEOUS
  Administered 2023-12-28: 4 [IU] via SUBCUTANEOUS
  Administered 2023-12-29 (×2): 3 [IU] via SUBCUTANEOUS
  Administered 2023-12-30: 4 [IU] via SUBCUTANEOUS
  Filled 2023-12-25 (×11): qty 1

## 2023-12-25 MED ORDER — LACTATED RINGERS IV BOLUS
500.0000 mL | Freq: Once | INTRAVENOUS | Status: AC
  Administered 2023-12-25: 500 mL via INTRAVENOUS

## 2023-12-25 MED ORDER — METRONIDAZOLE 500 MG/100ML IV SOLN
500.0000 mg | Freq: Two times a day (BID) | INTRAVENOUS | Status: AC
  Administered 2023-12-26 – 2023-12-29 (×9): 500 mg via INTRAVENOUS
  Filled 2023-12-25 (×10): qty 100

## 2023-12-25 MED ORDER — IPRATROPIUM-ALBUTEROL 0.5-2.5 (3) MG/3ML IN SOLN
3.0000 mL | Freq: Four times a day (QID) | RESPIRATORY_TRACT | Status: DC
  Filled 2023-12-25: qty 3

## 2023-12-25 MED ORDER — GABAPENTIN 100 MG PO CAPS
100.0000 mg | ORAL_CAPSULE | Freq: Three times a day (TID) | ORAL | Status: DC
  Administered 2023-12-25 – 2023-12-27 (×4): 100 mg via ORAL
  Filled 2023-12-25 (×8): qty 1

## 2023-12-25 MED ORDER — FENTANYL CITRATE PF 50 MCG/ML IJ SOSY
25.0000 ug | PREFILLED_SYRINGE | Freq: Once | INTRAMUSCULAR | Status: AC
  Administered 2023-12-25: 25 ug via INTRAVENOUS
  Filled 2023-12-25: qty 1

## 2023-12-25 MED ORDER — SODIUM CHLORIDE 0.9 % IV SOLN
2.0000 g | Freq: Once | INTRAVENOUS | Status: AC
  Administered 2023-12-25: 2 g via INTRAVENOUS
  Filled 2023-12-25: qty 12.5

## 2023-12-25 MED ORDER — ACETAMINOPHEN 650 MG RE SUPP: 650.0000 mg | Freq: Four times a day (QID) | RECTAL | Status: DC | PRN

## 2023-12-25 MED ORDER — VANCOMYCIN HCL IN DEXTROSE 1-5 GM/200ML-% IV SOLN
1000.0000 mg | INTRAVENOUS | Status: DC
  Filled 2023-12-25 (×3): qty 200

## 2023-12-25 MED ORDER — ONDANSETRON HCL 4 MG/2ML IJ SOLN
4.0000 mg | Freq: Four times a day (QID) | INTRAMUSCULAR | Status: DC | PRN
  Administered 2023-12-26 – 2023-12-29 (×8): 4 mg via INTRAVENOUS
  Filled 2023-12-25 (×8): qty 2

## 2023-12-25 MED ORDER — LACTATED RINGERS IV SOLN
150.0000 mL/h | INTRAVENOUS | Status: DC
Start: 2023-12-25 — End: 2023-12-26
  Administered 2023-12-25 (×2): 150 mL/h via INTRAVENOUS

## 2023-12-25 MED ORDER — ENOXAPARIN SODIUM 60 MG/0.6ML IJ SOSY
0.5000 mg/kg | PREFILLED_SYRINGE | INTRAMUSCULAR | Status: DC
  Administered 2023-12-25: 57.5 mg via SUBCUTANEOUS
  Filled 2023-12-25: qty 0.6

## 2023-12-25 MED ORDER — MORPHINE SULFATE ER 15 MG PO TBCR
15.0000 mg | EXTENDED_RELEASE_TABLET | Freq: Two times a day (BID) | ORAL | Status: DC | PRN
  Administered 2023-12-25 – 2023-12-29 (×4): 15 mg via ORAL
  Filled 2023-12-25 (×4): qty 1

## 2023-12-25 MED ORDER — SODIUM CHLORIDE 0.9% FLUSH
3.0000 mL | Freq: Two times a day (BID) | INTRAVENOUS | Status: DC
  Administered 2023-12-25 – 2023-12-30 (×11): 3 mL via INTRAVENOUS

## 2023-12-25 MED ORDER — LACTATED RINGERS IV BOLUS (SEPSIS)
1000.0000 mL | Freq: Once | INTRAVENOUS | Status: AC
  Administered 2023-12-25: 1000 mL via INTRAVENOUS

## 2023-12-25 MED ORDER — ONDANSETRON HCL 4 MG PO TABS
4.0000 mg | ORAL_TABLET | Freq: Four times a day (QID) | ORAL | Status: DC | PRN
  Administered 2023-12-26: 4 mg via ORAL
  Filled 2023-12-25: qty 1

## 2023-12-25 MED ORDER — VANCOMYCIN HCL IN DEXTROSE 1-5 GM/200ML-% IV SOLN: 1000.0000 mg | Freq: Once | INTRAVENOUS | Status: DC

## 2023-12-25 MED ORDER — VANCOMYCIN HCL 2000 MG/400ML IV SOLN
2000.0000 mg | Freq: Once | INTRAVENOUS | Status: AC
  Administered 2023-12-25: 2000 mg via INTRAVENOUS
  Filled 2023-12-25: qty 400

## 2023-12-25 MED ORDER — ENOXAPARIN SODIUM 40 MG/0.4ML IJ SOSY: 40.0000 mg | PREFILLED_SYRINGE | INTRAMUSCULAR | Status: DC

## 2023-12-25 MED ORDER — METRONIDAZOLE 500 MG/100ML IV SOLN
500.0000 mg | Freq: Once | INTRAVENOUS | Status: AC
  Administered 2023-12-25: 500 mg via INTRAVENOUS
  Filled 2023-12-25: qty 100

## 2023-12-25 NOTE — Progress Notes (Signed)
 PHARMACIST - PHYSICIAN COMMUNICATION  CONCERNING:  Enoxaparin  (Lovenox ) for DVT Prophylaxis    RECOMMENDATION: Patient was prescribed enoxaprin 40mg  q24 hours for VTE prophylaxis.   Filed Weights   12/25/23 0922  Weight: 112.6 kg (248 lb 3.8 oz)    Body mass index is 45.4 kg/m.  Estimated Creatinine Clearance: 56.4 mL/min (A) (by C-G formula based on SCr of 1.18 mg/dL (H)).   Based on [facility] policy patient is candidate for enoxaparin  0.5mg /kg TBW SQ every 24 hours based on BMI being >30.  DESCRIPTION: Pharmacy has adjusted enoxaparin  dose per [facility] policy.  Patient is now receiving enoxaparin  57.5 mg every 24 hours   Malone Sear, PharmD, BCPS Clinical Pharmacist 12/25/2023 2:03 PM

## 2023-12-25 NOTE — Assessment & Plan Note (Addendum)
 Patient presenting with 1 week of vomiting and intermittently melanotic diarrhea.  Potentially secondary to recent chemotherapy, however will need to evaluate for infectious etiology as well. No evidence of colitis on imaging  - GI panel ordered - IV fluids as noted above - Zofran  as needed

## 2023-12-25 NOTE — Assessment & Plan Note (Signed)
 History of recently diagnosed stage IV pancreatic cancer with metastasis to the liver on chemotherapy.She last received paclitaxel  and gemcitabine  on April 25.

## 2023-12-25 NOTE — ED Notes (Signed)
 Adjusted thermostat per patient request

## 2023-12-25 NOTE — Assessment & Plan Note (Signed)
 Likely due to dehydration. She will go to ER for management.

## 2023-12-25 NOTE — Consult Note (Signed)
 Pharmacy Antibiotic Note  Kristina Cruz is a 63 y.o. female admitted on 12/25/2023 with generalized weakness.  Pharmacy has been consulted for Vancomycin  and Cefepime dosing.  Plan: Vancomycin  2g IV x 1 as loading dose, followed by: 1000 mg IV Q 24 hrs. Goal AUC 400-550. Expected AUC: 498.9 SCr used: 1.18  Cefepime 2g IV Q 12 hrs   Height: 5\' 2"  (157.5 cm) Weight: 112.6 kg (248 lb 3.8 oz) IBW/kg (Calculated) : 50.1  Temp (24hrs), Avg:97.6 F (36.4 C), Min:97.5 F (36.4 C), Max:97.6 F (36.4 C)  Recent Labs  Lab 12/25/23 0816 12/25/23 0945 12/25/23 0946 12/25/23 1140  WBC 24.4*  --  24.6*  --   CREATININE 1.33*  --  1.18*  --   LATICACIDVEN  --  1.6  --  2.0*    Estimated Creatinine Clearance: 56.4 mL/min (A) (by C-G formula based on SCr of 1.18 mg/dL (H)).    Allergies  Allergen Reactions   Peanut-Containing Drug Products     Antimicrobials this admission: Vancomycin  5/9 >>  Cefepime 5/9 >>   Dose adjustments this admission: N/A  Microbiology results: 5/9 BCx: collected 5/9 MRSA PCR: pending  Thank you for allowing pharmacy to be a part of this patient's care.  Timothy Ford A Khalik Pewitt 12/25/2023 1:59 PM

## 2023-12-25 NOTE — Assessment & Plan Note (Signed)
 During evaluation for sepsis, a new cavitary lesion in the left lower lobe was discovered, measuring 3.5 x 2.6 cm.  Given new cough, this may be a possible source of her sepsis as she is immuno compromised in the setting of chemotherapy or may be new metastasis of her known stage IV pancreatic cancer.  - Antibiotics as noted above - Expectorated sputum culture, strep pneumo and Legionella urinary antigens - Consider pulmonology consultation

## 2023-12-25 NOTE — Sepsis Progress Note (Signed)
 Code Sepsis protocol being monitored by eLink.

## 2023-12-25 NOTE — Progress Notes (Signed)
 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  ASSESSMENT & PLAN:   Pancreatic cancer metastasized to liver [facility]) Imaging findings and different lesion biopsy results were reviewed and discussed with patient. 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  Other differential diagnosis include GI tract adenocarcinoma, cholangiocarcinoma etc. CEA is elevated.  His CA 19-9 is normal. CT and MRI imaging is did not show any GI primary.I recommend a PET scan for further evaluation- patient rescheduled appt.   Labs are reviewed and discussed with patient. Hold chemotherapy Gemcitabine /Abraxane    AKI (acute kidney injury) (HCC) Likely due to dehydration. She will go to ER for management.   SIRS (systemic inflammatory response syndrome) (HCC) Tachycardia, leukocytosis, suspect that she has underlying infection/sepsis.  I recommend patient to go to ER for further evaluation and management.  I called ER and spoke to Triage RN.     No orders of the defined types were placed in this encounter.  Follow-up TBD All questions were answered. The patient knows to call the clinic with any problems, questions or concerns.  [name], MD, PhD [facility] Hematology Oncology 12/25/2023    HISTORY OF PRESENTING ILLNESS:  Kristina Cruz 63 y.o. female presents to establish care for Stage IV pancreatic cancer I have reviewed her chart and materials related to her cancer extensively and collaborated history with the patient. Summary of oncologic history is as follows: Oncology History  Pancreatic cancer metastasized to liver (HCC)  08/21/2023 Imaging   CT abdomen pelvis with contrast showed 1. Masslike thickening involving the proximal and mid tail of pancreas with relative hypoenhancement measures approximately 3.9  x 5.5 cm. In the absence of signs/symptoms of acute pancreatitis underlying neoplastic process cannot be excluded. Further evaluation with nonemergent contrast enhanced MRI of the abdomen is advised. Note: Given patient's body habitus and the motion artifact observed on the current exam an MRI obtained at this time is likely to be severely limited and likely nondiagnostic. MRI should be obtained only once the patient is clinically stable, and is able to remain motionless and breath hold. 2. Patchy ground-glass and airspace densities identified within both lower lobes. These are new compared with the previous exam and are concerning for underlying inflammatory/infectious process. 3. Sigmoid diverticulosis without signs of acute diverticulitis. 4. Marked diastasis recti with ventral herniation of the large and small bowel loops. 5. Periumbilical hernia is containing fat only.   08/24/2023 Imaging   MRI abdomen wo contrast  1. Moderately suboptimal unenhanced exam. 2. Heterogeneous masslike thickening of the pancreatic body/tail again seen. The lesion remains indeterminate on this exam. Differential diagnosis includes pancreatic tumor, autoimmune pancreatitis, mass forming chronic pancreatitis, etc. Please see above for follow-up recommendations. 3. Mild diffuse hepatic steatosis. There are indeterminate areas in the liver, which can also be better evaluated on the contrast-enhanced MRI abdomen. 4. Other observations, as described above.   11/05/2023 Initial Diagnosis   Pancreatic cancer metastasized to liver  Patient was hospitalized from 11/05/2023 - 11/10/2023 She presented to emergency room due to nausea, vomiting and abdominal pain. CT scan showed multiple hypodense hepatic lesions increasing number and size from prior imaging.  Pancreatic tail mass, hypodense lesion in the anterior spleen.  11/09/2023 ultrasound-guided biopsy was obtained. Pathology showed moderately to poorly  differentiated adenocarcinoma with extensive areas of tumor necrosis. The adenocarcinoma is positive for cytokeratin 7 and CDX2 (focal).  Cytokeratin 20, TTF-1, and GATA3 are negative. The pattern of immunoreactivity is not specific and the differential diagnosis includes metastatic adenocarcinoma from the pancreaticobiliary tract and upper GI. Intrahepatic       cholangiocarcinoma is also a diagnostic possibility. Given the radiographic  findings a pancreaticobiliary primary is favored and correlation with clinical impression is required.    11/17/2023 Cancer Staging   Staging form: Exocrine Pancreas, AJCC 8th Edition - Clinical stage from 11/17/2023: Stage IV (cT3, cNX, pM1) - Signed by [name], MD on 11/17/2023 Stage prefix: Initial diagnosis    Imaging   CT abdomen pelvis w contrast  1. Multiple hypodense hepatic lesions, increased in number and size from prior imaging, highly suspicious for metastatic disease. 2. Hypodense lesion in the pancreatic tail measures 7.2 x 3.9 cm, increased in size from prior. The increase in size favors pancreatic neoplasm, with additional etiologies less likely. 3. New hypodense lesion in the anterior spleen, 13 mm, suspicious for metastatic disease. 4. Laxity of the anterior abdominal wall with midline ventral abdominal wall hernia containing small and to a lesser extent large bowel. No bowel obstruction or inflammation. 5. Colonic diverticulosis without diverticulitis.   Aortic Atherosclerosis (ICD10-I70.0)    11/27/2023 -  Chemotherapy   Patient is on Treatment Plan : PANCREATIC Abraxane  D1,8,15 + Gemcitabine  D1,8,15 q28d      Patient would like to get a medi port.  She presents for evaluation prior to chemo.  She reports feeling poorly today. She feels weakness for past a few days, poor appetite with poor oral intake bilateral lower extremity swelling. Also endorses decreased urine output.  No fever. + SOB.    MEDICAL HISTORY:  Past Medical  History:  Diagnosis Date   Anemia    Asthma    Back pain    Cataract    Diabetes mellitus without complication (HCC)    GERD (gastroesophageal reflux disease)    Hypertension    Morbid obesity with BMI of 60.0-69.9, adult (HCC)    Restless leg syndrome     SURGICAL HISTORY: Past Surgical History:  Procedure Laterality Date   ABDOMINAL HYSTERECTOMY     CATARACT EXTRACTION W/PHACO Left 04/13/2020   Procedure: CATARACT EXTRACTION PHACO AND INTRAOCULAR LENS PLACEMENT (IOC);  Surgeon: Ola Berger, MD;  Location: [facility] ORS;  Service: Ophthalmology;  Laterality: Left;  US  00:36.0 CDE 5.95 Fluid Pack lot # [id number]   HYSTEROSCOPY WITH D & C N/A 07/14/2017   Procedure: DILATATION AND CURETTAGE /HYSTEROSCOPY;  Surgeon: Alben Alma, MD;  Location: [facility] ORS;  Service: Gynecology;  Laterality: N/A;   IR IMAGING GUIDED PORT INSERTION  12/17/2023   POLYPECTOMY  2015    SOCIAL HISTORY: Social History   Socioeconomic History   Marital status: Single    Spouse name: Not on file   Number of children: Not on file   Years of education: Not on file   Highest education level: Not on file  Occupational History   Not on file  Tobacco Use   Smoking status: Never   Smokeless tobacco: Never  Vaping Use   Vaping status: Never Used  Substance and Sexual Activity   Alcohol use: No   Drug use: No   Sexual activity: Never    Birth control/protection: None  Other Topics Concern   Not on file  Social History Narrative   Not on file   Social Drivers of Health   Financial Resource Strain: Low Risk  (09/26/2022)   Received from [facility],  [facility]   Overall Financial Resource Strain (CARDIA)    Difficulty of Paying Living Expenses: Not hard at all  Food Insecurity: No Food Insecurity (11/06/2023)   Hunger Vital Sign    Worried About Running Out of Food in the Last Year: Never true    Ran Out of Food in the Last Year: Never true  Transportation Needs:  No Transportation Needs (11/06/2023)   PRAPARE - Administrator, Civil Service (Medical): No    Lack of Transportation (Non-Medical): No  Recent Concern: Transportation Needs - Unmet Transportation Needs (09/28/2023)   PRAPARE - Administrator, Civil Service (Medical): Yes    Lack of Transportation (Non-Medical): Yes  Physical Activity: Not on file  Stress: Not on file  Social Connections: Socially Isolated (11/06/2023)   Social Connection and Isolation Panel [NHANES]    Frequency of Communication with Friends and Family: More than three times a week    Frequency of Social Gatherings with Friends and Family: More than three times a week    Attends Religious Services: Never    Database administrator or Organizations: No    Attends Banker Meetings: Never    Marital Status: Never married  Intimate Partner Violence: Not At Risk (11/06/2023)   Humiliation, Afraid, Rape, and Kick questionnaire    Fear of Current or Ex-Partner: No    Emotionally Abused: No    Physically Abused: No    Sexually Abused: No    FAMILY HISTORY: Family History  Problem Relation Age of Onset   Breast cancer Mother 37   Diabetes Mother    Hypertension Mother    Ovarian cancer Paternal Aunt        ?   Diabetes Father    Hypertension Father     ALLERGIES:  is allergic to peanut-containing drug products.  MEDICATIONS:  No current facility-administered medications for this visit.   Current Outpatient Medications  Medication Sig Dispense Refill   acetaminophen  (TYLENOL  8 HOUR ARTHRITIS PAIN) 650 MG CR tablet Take 3 tablets (1,950 mg total) by mouth every 8 (eight) hours as needed for pain. Do not exceed 4000 mg total in a day.  Home med.     azelastine (ASTELIN) 0.1 % nasal spray Place 1 spray into both nostrils 2 (two) times daily.     diphenoxylate -atropine  (LOMOTIL ) 2.5-0.025 MG tablet Take 2 tablets by mouth every 6 (six) hours as needed (for diarrhea unrelieved by  imodium). 90 tablet 0   EPINEPHrine  0.3 mg/0.3 mL IJ SOAJ injection Inject 0.3 mg into the muscle as needed for anaphylaxis. 1 each 0   fluticasone  (FLONASE ) 50 MCG/ACT nasal spray Place 1 spray into both nostrils daily. 9.9 mL 0   fluticasone  furoate-vilanterol (BREO ELLIPTA ) 100-25 MCG/ACT AEPB Inhale 1 puff into the lungs daily. 28 each 1   furosemide  (LASIX ) 40 MG tablet Take 40 mg by mouth every other day.     gabapentin  (NEURONTIN ) 100 MG capsule Take 1 capsule (100 mg total) by mouth 3 (three) times daily. 30 capsule 0   ipratropium-albuterol  (DUONEB) 0.5-2.5 (3) MG/3ML SOLN Take 3 mLs by nebulization every 4 (four) hours as needed (wheezing / shortness of breath). 360 mL 1   levocetirizine (XYZAL) 5 MG tablet Take 5 mg by mouth daily.     magic mouthwash (lidocaine , diphenhydrAMINE , alum & mag hydroxide) suspension Swish and swallow 5 mLs 4 (four) times daily as needed for mouth pain. 360 mL 1  metFORMIN  (GLUCOPHAGE ) 1000 MG tablet Take 1,000 mg by mouth 2 (two) times daily.     morphine  (MS CONTIN ) 15 MG 12 hr tablet Take 1 tablet (15 mg total) by mouth every 12 (twelve) hours. 30 tablet 0   naloxone  (NARCAN ) nasal spray 4 mg/0.1 mL SPRAY 1 SPRAY INTO ONE NOSTRIL AS DIRECTED FOR OPIOID OVERDOSE (TURN PERSON ON SIDE AFTER DOSE. IF NO RESPONSE IN 2-3 MINUTES OR PERSON RESPONDS BUT RELAPSES, REPEAT USING A NEW SPRAY DEVICE AND SPRAY INTO THE OTHER NOSTRIL. CALL 911 AFTER USE.) * EMERGENCY USE ONLY * 1 each 0   ondansetron  (ZOFRAN ) 8 MG tablet Take 1 tablet (8 mg total) by mouth every 8 (eight) hours as needed for nausea or vomiting. 30 tablet 1   oxyCODONE  (OXY IR/ROXICODONE ) 5 MG immediate release tablet Take 1 tablet (5 mg total) by mouth every 4 (four) hours as needed for moderate pain (pain score 4-6) or severe pain (pain score 7-10). 90 tablet 0   potassium chloride  SA (KLOR-CON  M) 20 MEQ tablet Take 1 tablet (20 mEq total) by mouth daily. 3 tablet 0   prochlorperazine  (COMPAZINE ) 10 MG  tablet Take 1 tablet (10 mg total) by mouth every 6 (six) hours as needed for nausea or vomiting. 30 tablet 1   rOPINIRole  (REQUIP ) 2 MG tablet Take 2 mg by mouth in the morning and at bedtime.      Semaglutide, 1 MG/DOSE, 4 MG/3ML SOPN Inject 1 mg into the skin once a week.     VENTOLIN  HFA 108 (90 Base) MCG/ACT inhaler Inhale 2 puffs into the lungs every 4 (four) hours as needed for wheezing or shortness of breath. 18 g 1   amLODipine  (NORVASC ) 10 MG tablet Take 1 tablet (10 mg total) by mouth daily. 30 tablet 0   Facility-Administered Medications Ordered in Other Visits  Medication Dose Route Frequency Provider Last Rate Last Admin   metroNIDAZOLE (FLAGYL) IVPB 500 mg  500 mg Intravenous Once Siadecki, Sebastian, MD       vancomycin  (VANCOREADY) IVPB 2000 mg/400 mL  2,000 mg Intravenous Once Nazari, Walid A, RPH        Review of Systems  Constitutional:  Positive for appetite change and fatigue. Negative for chills and fever.  HENT:   Negative for hearing loss and voice change.   Eyes:  Negative for eye problems.  Respiratory:  Positive for shortness of breath. Negative for chest tightness and cough.   Cardiovascular:  Positive for leg swelling. Negative for chest pain.  Gastrointestinal:  Positive for abdominal pain and nausea. Negative for abdominal distention and blood in stool.  Endocrine: Negative for hot flashes.  Genitourinary:  Negative for difficulty urinating and frequency.   Musculoskeletal:  Negative for arthralgias.  Skin:  Negative for itching and rash.  Neurological:  Negative for extremity weakness.  Hematological:  Negative for adenopathy.  Psychiatric/Behavioral:  Negative for confusion.      PHYSICAL EXAMINATION: ECOG PERFORMANCE STATUS: 1 - Symptomatic but completely ambulatory  Vitals:   12/25/23 0842  BP: 107/76  Pulse: (!) 140  Resp: 15  Temp: (!) 97.5 F (36.4 C)  SpO2: 98%   Filed Weights    Physical Exam Constitutional:      General: She is  not in acute distress.    Appearance: She is obese. She is not diaphoretic.  HENT:     Head: Normocephalic and atraumatic.  Eyes:     General: No scleral icterus. Cardiovascular:     Rate  and Rhythm: Regular rhythm. Tachycardia present.  Pulmonary:     Effort: Pulmonary effort is normal. No respiratory distress.     Breath sounds: Rales present. No wheezing.     Comments: Decreased breath sound bilaterally Abdominal:     General: Bowel sounds are normal. There is no distension.     Palpations: Abdomen is soft.  Musculoskeletal:        General: Swelling present. Normal range of motion.     Cervical back: Normal range of motion and neck supple.  Skin:    General: Skin is warm and dry.     Findings: No erythema.  Neurological:     Mental Status: She is alert and oriented to person, place, and time. Mental status is at baseline.     Motor: No abnormal muscle tone.  Psychiatric:        Mood and Affect: Mood and affect normal.      LABORATORY DATA:  I have reviewed the data as listed    Latest Ref Rng & Units 12/25/2023    9:46 AM 12/25/2023    8:16 AM 12/11/2023    8:34 AM  CBC  WBC 4.0 - 10.5 K/uL 24.6  24.4  3.2   Hemoglobin 12.0 - 15.0 g/dL 91.4  78.2  95.6   Hematocrit 36.0 - 46.0 % 31.3  32.0  34.6   Platelets 150 - 400 K/uL 164  189  117       Latest Ref Rng & Units 12/25/2023    9:46 AM 12/25/2023    8:16 AM 12/11/2023    8:35 AM  CMP  Glucose 70 - 99 mg/dL 213  086  578   BUN 8 - 23 mg/dL 29  28  9    Creatinine 0.44 - 1.00 mg/dL 4.69  6.29  5.28   Sodium 135 - 145 mmol/L 131  130  136   Potassium 3.5 - 5.1 mmol/L 3.0  3.0  3.2   Chloride 98 - 111 mmol/L 93  91  95   CO2 22 - 32 mmol/L 24  25  30    Calcium  8.9 - 10.3 mg/dL 8.1  8.0  8.6   Total Protein 6.5 - 8.1 g/dL 7.0  6.9  7.5   Total Bilirubin 0.0 - 1.2 mg/dL 1.5  1.6  0.5   Alkaline Phos 38 - 126 U/L 98  120  81   AST 15 - 41 U/L 24  25  26    ALT 0 - 44 U/L 15  14  20       RADIOGRAPHIC STUDIES: I have  personally reviewed the radiological images as listed and agreed with the findings in the report. DG Chest Port 1 View Result Date: 12/25/2023 CLINICAL DATA:  Suspected sepsis EXAM: PORTABLE CHEST 1 VIEW COMPARISON:  09/28/2023 FINDINGS: Numerous leads and wires project over the chest. Patient is rotated to the left. Mildly degraded exam due to AP portable technique and patient body habitus. Mild cardiomegaly. No right-sided pleural effusion. Left costophrenic angle is obscured by overlying breast tissue. IMPRESSION: Degraded exam secondary to AP portable technique and patient body habitus. Especially the inferior left hemithorax is poorly evaluated secondary to overlying soft tissues. Given this limitation, cardiomegaly and low lung volumes without acute disease. If there is a clinical concern of left lower lobe pneumonia, recommend PA and lateral radiographs. Electronically Signed   By: Lore Rode M.D.   On: 12/25/2023 10:51   IR IMAGING GUIDED PORT INSERTION Result Date: 12/17/2023 INDICATION:  Port-A-Cath needed for treatment of metastatic pancreatic cancer. EXAM: FLUOROSCOPIC AND ULTRASOUND GUIDED PLACEMENT OF A SUBCUTANEOUS PORT MEDICATIONS: Fentanyl  50 mcg ANESTHESIA/SEDATION: Patient's level of consciousness and vital signs were monitored continuously by radiology nurse throughout the procedure under the supervision of the provider performing the procedure. FLUOROSCOPY TIME:  Radiation Exposure Index (as provided by the fluoroscopic device): 17 mGy Kerma COMPLICATIONS: None immediate. PROCEDURE: The procedure, risks, benefits, and alternatives were explained to the patient. Questions regarding the procedure were encouraged and answered. The patient understands and consents to the procedure. Patient was placed supine on the interventional table. Ultrasound confirmed a patent right internal jugular vein. Ultrasound image was saved for documentation. The right chest and neck were cleaned with a skin  antiseptic and a sterile drape was placed. Maximal barrier sterile technique was utilized including caps, mask, sterile gowns, sterile gloves, sterile drape, hand hygiene and skin antiseptic. The right neck was anesthetized with 1% lidocaine . Small incision was made in the right neck with a blade. Micropuncture set was placed in the right internal jugular vein with ultrasound guidance. The micropuncture wire was used for measurement purposes. The right chest was anesthetized with 1% lidocaine  with epinephrine . #15 blade was used to make an incision and a subcutaneous port pocket was formed. 8 french Power Port was assembled. Subcutaneous tunnel was formed with a stiff tunneling device. The port catheter was brought through the subcutaneous tunnel. The port was placed in the subcutaneous pocket. The micropuncture set was exchanged for a peel-away sheath. The catheter was placed through the peel-away sheath and the tip was positioned at the superior cavoatrial junction. Catheter placement was confirmed with fluoroscopy. The port was accessed and flushed with heparinized saline. The port pocket was closed using two layers of absorbable sutures and Dermabond. The vein skin site was closed using a single layer of absorbable suture and Dermabond. Sterile dressings were applied. Patient tolerated the procedure well without an immediate complication. Ultrasound and fluoroscopic images were taken and saved for this procedure. IMPRESSION: Placement of a subcutaneous power-injectable port device. Catheter tip at the superior cavoatrial junction. Electronically Signed   By: Elene Griffes M.D.   On: 12/17/2023 13:13

## 2023-12-25 NOTE — Assessment & Plan Note (Signed)
 Mild AKI in the setting of hypovolemia and sepsis.  - IV fluids as ordered - Hold nephrotoxic agents - Repeat BMP in the a.m.

## 2023-12-25 NOTE — Assessment & Plan Note (Addendum)
 Imaging findings and different lesion biopsy results were reviewed and discussed with patient. 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  Other differential diagnosis include GI tract adenocarcinoma, cholangiocarcinoma etc. CEA is elevated.  His CA 19-9 is normal. CT and MRI imaging is did not show any GI primary.I recommend a PET scan for further evaluation- patient rescheduled appt.   Labs are reviewed and discussed with patient. Hold chemotherapy Gemcitabine /Abraxane 

## 2023-12-25 NOTE — Assessment & Plan Note (Addendum)
 No prior history of CHF, endorsing severe worsening of bilateral lower extremity edema with elevated BNP despite morbid obesity.  Unfortunately, she is needing IV fluid resuscitation for sepsis and associated hypotension.  Very high risk for pulmonary edema.  - Daily weights - Strict and out - Hold off on diuretics - Echocardiogram ordered

## 2023-12-25 NOTE — ED Notes (Signed)
 Patient assisted onto bedpan.

## 2023-12-25 NOTE — Assessment & Plan Note (Signed)
 Tachycardia, leukocytosis, suspect that she has underlying infection/sepsis.  I recommend patient to go to ER for further evaluation and management.  I called ER and spoke to Triage RN.

## 2023-12-25 NOTE — Consult Note (Signed)
 CODE SEPSIS - PHARMACY COMMUNICATION  **Broad Spectrum Antibiotics should be administered within 1 hour of Sepsis diagnosis**  Time Code Sepsis Called/Page Received: 1051  Antibiotics Ordered: vancomycin , cefepime, flagyl  Time of 1st antibiotic administration: 1059  Additional action taken by pharmacy: n/a  If necessary, Name of Provider/Nurse Contacted: n/a    Qamar Rosman A Quetzal Meany ,PharmD Clinical Pharmacist  12/25/2023  10:58 AM

## 2023-12-25 NOTE — Assessment & Plan Note (Signed)
 Patient has a history of mixed COPD/asthma with multiple recent exacerbations, however no wheezing on examination at this time and she notes her shortness of breath is at baseline.  - Given high risk, will schedule DuoNebs every 6 hours - Continue home bronchodilators - Pulmonary toilet

## 2023-12-25 NOTE — Assessment & Plan Note (Addendum)
 Patient is presenting with severe tachycardia, hypotension, leukocytosis concern for sepsis, with suspected sources being either GI versus pulmonary.  She has been experiencing diarrhea/vomiting for 1 week and a new productive cough for 1 day.  - Telemetry monitoring - S/p 2.1 L bolus.  Continue maintenance fluids - Blood cultures pending - Continue cefepime, vancomycin , Flagyl - Continue to trend lactic acid

## 2023-12-25 NOTE — ED Provider Notes (Signed)
 [facility] Provider Note    Event Date/Time   First MD Initiated Contact with Patient 12/25/23 0915     (approximate)   History   Weakness   HPI  Kristina Cruz is a 63 y.o. female with a history of hypertension, diabetes, anemia, asthma, and metastatic pancreatic cancer on chemotherapy who presents with generalized weakness for the last 4 to 5 days, gradual onset, associated with nausea and vomiting as well as lightheadedness.  The patient has had some shortness of breath as well, and some diarrhea.  She reports swelling to both legs that has developed over the last 2 days.  She denies any fever.  She has no urinary symptoms.  She has no chest pain.  She was evaluated at the cancer center today and sent over due to elevated WBC count.  I reviewed the past medical records.  The patient's most recent outpatient encounter was on 5/1 with interventional radiology for portacatheter placement for chemotherapy.  Previous she was seen by Dr. Wilhelmenia Harada at the cancer center on 4/25 for evaluation prior to chemo.   Physical Exam   Triage Vital Signs: ED Triage Vitals [12/25/23 0912]  Encounter Vitals Group     BP 105/64     Systolic BP Percentile      Diastolic BP Percentile      Pulse Rate (!) 140     Resp 18     Temp 97.6 F (36.4 C)     Temp Source Oral     SpO2 98 %     Weight      Height      Head Circumference      Peak Flow      Pain Score      Pain Loc      Pain Education      Exclude from Growth Chart     Most recent vital signs: Vitals:   12/25/23 1255 12/25/23 1300  BP: 96/66 98/70  Pulse: (!) 138   Resp: 20 15  Temp:    SpO2: 97%      General: Alert, somewhat weak appearing but in no distress.  CV:  Good peripheral perfusion.  Tachycardic, regular rhythm. Resp:  Normal effort.  Lungs CTAB. Abd:  Soft with no focal tenderness.  No distention.  Other:  Somewhat dry mucous membranes.  2+ bilateral lower extremity edema.   ED Results  / Procedures / Treatments   Labs (all labs ordered are listed, but only abnormal results are displayed) Labs Reviewed  LACTIC ACID, PLASMA - Abnormal; Notable for the following components:      Result Value   Lactic Acid, Venous 2.0 (*)    All other components within normal limits  PROTIME-INR - Abnormal; Notable for the following components:   Prothrombin Time 15.8 (*)    All other components within normal limits  COMPREHENSIVE METABOLIC PANEL WITH GFR - Abnormal; Notable for the following components:   Sodium 131 (*)    Potassium 3.0 (*)    Chloride 93 (*)    Glucose, Bld 219 (*)    BUN 29 (*)    Creatinine, Ser 1.18 (*)    Calcium  8.1 (*)    Albumin  2.7 (*)    Total Bilirubin 1.5 (*)    GFR, Estimated 51 (*)    All other components within normal limits  CBC WITH DIFFERENTIAL/PLATELET - Abnormal; Notable for the following components:   WBC 24.6 (*)    RBC 3.84 (*)  Hemoglobin 10.7 (*)    HCT 31.3 (*)    nRBC 2.3 (*)    Neutro Abs 15.7 (*)    Monocytes Absolute 3.0 (*)    Abs Immature Granulocytes 2.00 (*)    All other components within normal limits  BRAIN NATRIURETIC PEPTIDE - Abnormal; Notable for the following components:   B Natriuretic Peptide 109.6 (*)    All other components within normal limits  RESP PANEL BY RT-PCR (RSV, FLU A&B, COVID)  RVPGX2  CULTURE, BLOOD (ROUTINE X 2)  CULTURE, BLOOD (ROUTINE X 2)  LACTIC ACID, PLASMA  LIPASE, BLOOD  URINALYSIS, W/ REFLEX TO CULTURE (INFECTION SUSPECTED)  TROPONIN I (HIGH SENSITIVITY)  TROPONIN I (HIGH SENSITIVITY)     EKG  ED ECG REPORT I, Lind Repine, the attending physician, personally viewed and interpreted this ECG.  Date: 12/25/2023 EKG Time: 09 20 Rate: 140 Rhythm: Sinus tachycardia QRS Axis: normal Intervals: RBBB, LAFB ST/T Wave abnormalities: LVH repolarization abnormality Narrative Interpretation: no evidence of acute ischemia    RADIOLOGY  Chest x-ray: I independently viewed and  interpreted the images; there is no focal consolidation or obvious edema although x-ray is limited by the patient's habitus  CT abdomen/pelvis:  IMPRESSION:  1. Redemonstration of ill-defined heterogeneous hypoattenuating mass  centered within the pancreatic tail and distal body, highly  concerning for pancreatic neoplasm. 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  2. Redemonstration of multiple hypoattenuating liver lesions, which  have increased in size since the prior study, compatible with  worsening metastases.  3. There is a new thick-walled cavitary lesion in the left lung  lower lobe measuring 2.4 x 2.8 cm. This is indeterminate and  differential diagnosis includes metastases versus  infectious/inflammatory etiology. Consider dedicated chest imaging.  4. Multiple other nonacute observations, as described above.    Aortic Atherosclerosis (ICD10-I70.0).   CT chest: Pending   PROCEDURES:  Critical Care performed: Yes, see critical care procedure note(s)  .Critical Care  Performed by: Lind Repine, MD Authorized by: Lind Repine, MD   Critical care provider statement:    Critical care time (minutes):  45   Critical care time was exclusive of:  Separately billable procedures and treating other patients   Critical care was necessary to treat or prevent imminent or life-threatening deterioration of the following conditions:  Sepsis   Critical care was time spent personally by me on the following activities:  Development of treatment plan with patient or surrogate, discussions with consultants, evaluation of patient's response to treatment, examination of patient, ordering and review of laboratory studies, ordering and review of radiographic studies, ordering and performing treatments and interventions, pulse oximetry, re-evaluation of patient's condition, review of old charts and obtaining history  from patient or surrogate   Care discussed with: admitting provider      MEDICATIONS ORDERED IN ED: Medications  vancomycin  (VANCOREADY) IVPB 2000 mg/400 mL (2,000 mg Intravenous New Bag/Given 12/25/23 1300)  lactated ringers  bolus 1,000 mL (0 mLs Intravenous Stopped 12/25/23 1049)    And  lactated ringers  bolus 600 mL (0 mLs Intravenous Stopped 12/25/23 1203)  ceFEPIme (MAXIPIME) 2 g in sodium chloride  0.9 % 100 mL IVPB (0 g Intravenous Stopped 12/25/23 1139)  metroNIDAZOLE (FLAGYL) IVPB 500 mg (0 mg Intravenous Stopped 12/25/23 1245)  iohexol  (OMNIPAQUE ) 300 MG/ML solution 100 mL (100 mLs Intravenous Contrast Given 12/25/23 1111)  lactated ringers  bolus 500 mL (500 mLs Intravenous New Bag/Given 12/25/23 1155)  fentaNYL  (SUBLIMAZE ) injection 25 mcg (25 mcg Intravenous Given 12/25/23 1208)     IMPRESSION / MDM / ASSESSMENT AND PLAN / ED COURSE  I reviewed the triage vital signs and the nursing notes.  63 year old female with PMH as noted above including stage IV pancreatic cancer on chemo, most recent treatment last week, who presents with generalized weakness, lightheadedness, nausea and vomiting, and lower extremity edema over the last 4 days.  She was sent from the cancer center after lab workup today showed an elevated WBC count.  On exam the vital signs are normal except for tachycardia and borderline low blood pressure.  Physical exam is otherwise remarkable for lower extremity edema and dry mucous membranes.  Differential diagnosis includes, but is not limited to, acute infection/sepsis, possible UTI, intra-abdominal source, pneumonia, versus dehydration, electrolyte abnormality, AKI, other metabolic cause, less likely primary cardiac etiology.  We will obtain lab workup, chest x-ray, give fluids, and reassess.  Patient's presentation is most consistent with acute presentation with potential threat to life or bodily function.  The patient is on the cardiac monitor to evaluate for evidence of  arrhythmia and/or significant heart rate changes.   ----------------------------------------- 1:49 PM on 12/25/2023 -----------------------------------------  Lab workup is significant for WBC count of 24.  Initial lactic was 1 6 and repeat is 2.0.  Respiratory panel is negative.  CMP shows no acute findings.  CT abdomen/pelvis did not show any acute intra-abdominal findings although did show a new lung lesion which is possibly infectious.  I have ordered a dedicated CT of the chest to evaluate this further.  The patient's blood pressure dropped to the 80s systolic and remained with a MAP rate around 65 for some time.  Heart rate remained in the 130s.  However, the patient responded to a 2 L fluid bolus and the blood pressure is now improved.  Heart rate remains elevated.  The patient continues to be alert and able to converse normally.  She appears well-perfused clinically.  I ordered broad-spectrum antibiotics for sepsis from an unknown source.  I counseled the patient on the results of the workup.  She will need admission for further management.  I consulted Dr. Guss Legacy from the hospitalist service; based on our discussion she agrees to evaluate the patient for admission.   FINAL CLINICAL IMPRESSION(S) / ED DIAGNOSES   Final diagnoses:  Sepsis, due to unspecified organism, unspecified whether acute organ dysfunction present [facility])     Rx / DC Orders   ED Discharge Orders     None        Note:  This document was prepared using Dragon voice recognition software and may include unintentional dictation errors.    Lind Repine, MD 12/25/23 1351

## 2023-12-25 NOTE — ED Triage Notes (Signed)
 Pt to ED via Cancer center. Pt has stage 4 pancreatic cancer and is on chemo. Pt states that her last chemo treatment was last week. Pt reports that she has been feeling bad since then. Pt has edema in bilateral LE and when her blood work was done at the cancer center this morning her WBC was 24k. Staff from cancer center reports that initially pt was very interactive with them and was playing on her cell phone, however, on the way to the ER pt was less interactive. Pt is alert and is answering questions appropriately. Pt is A & O x4.

## 2023-12-25 NOTE — H&P (Signed)
 History and Physical    Patient: Kristina Cruz UJW:[id number] DOB: [birth year] DOA: 12/25/2023 DOS: the patient was seen and examined on 12/25/2023 PCP: Chucky Craver, FNP  Patient coming from: Home  Chief Complaint:  Chief Complaint  Patient presents with   Weakness   HPI: Kristina Cruz is a 63 y.o. female with medical history significant of Stage V pancreatic cancer with liver metastasis on chemotherapy, uncontrolled type 2 diabetes, hypertension, COPD and asthma, morbid obesity, who presents to the ED due to weakness.  Ms. Koetz states that for the last 1 week, she has been experiencing diarrhea and nausea with vomiting every day.  She notes that her vomiting is mostly clear, but her diarrhea is intermittently melanotic.  Her most recent diarrheal episode was normal brown though.  Then in the last 1 day, she has developed a productive cough but denies any worsening in her breathing.  She denies any known fevers but feels very cold.  She denies chest pain, abdominal pain.  She also notes lower extremity swelling that seems worse than usual.  Per chart review, patient presented to her outpatient oncology appointment today and was noted to be tachycardic.  Labs were drawn and concerning for AKI, severe leukocytosis.  She was sent urgently to the ER at that time.  Last infusion of paclitaxel  and gemcitabine  was on December 11, 2023.  ED course: On arrival to the ED, patient was initially normotensive at 107/76 but subsequently became hypotensive at 77/66.  She was tachycardic at 150.  She was saturating at 96% on room air with respiratory rate up to 24.  She was afebrile 97.6.  Initial workup notable for WBC 24.6, hemoglobin 10.7, potassium 3.0, glucose 219, creatinine 1.18, GFR 51.  BNP 109, troponin 12 and then 14.  Lactic acid 1.6 and then 2.0.  CT chest and abdomen were obtained, notable for new cavitary lesion in the left lower lobe measuring 2.4 x 2.8 cm concerning for infection  versus metastasis, in addition to known pancreatic and liver malignancy.  Patient was started on cefepime, vancomycin , Flagyl, IV fluids.  TRH contacted for admission.   Review of Systems: As mentioned in the history of present illness. All other systems reviewed and are negative.  Past Medical History:  Diagnosis Date   Anemia    Asthma    Back pain    Cataract    Diabetes mellitus without complication (HCC)    GERD (gastroesophageal reflux disease)    Hypertension    Morbid obesity with BMI of 60.0-69.9, adult (HCC)    Restless leg syndrome    Past Surgical History:  Procedure Laterality Date   ABDOMINAL HYSTERECTOMY     CATARACT EXTRACTION W/PHACO Left 04/13/2020   Procedure: CATARACT EXTRACTION PHACO AND INTRAOCULAR LENS PLACEMENT (IOC);  Surgeon: Ola Berger, MD;  Location: [facility] ORS;  Service: Ophthalmology;  Laterality: Left;  US  00:36.0 CDE 5.95 Fluid Pack lot # [id number]   HYSTEROSCOPY WITH D & C N/A 07/14/2017   Procedure: DILATATION AND CURETTAGE /HYSTEROSCOPY;  Surgeon: Alben Alma, MD;  Location: [facility] ORS;  Service: Gynecology;  Laterality: N/A;   IR IMAGING GUIDED PORT INSERTION  12/17/2023   POLYPECTOMY  2015   Social History:  reports that she has never smoked. She has never used smokeless tobacco. She reports that she does not drink alcohol and does not use drugs.  Allergies  Allergen Reactions   Peanut-Containing Drug Products     Family History  Problem Relation Age of Onset  Breast cancer Mother 70   Diabetes Mother    Hypertension Mother    Ovarian cancer Paternal Aunt        ?   Diabetes Father    Hypertension Father     Prior to Admission medications   Medication Sig Start Date End Date Taking? Authorizing Provider  acetaminophen  (TYLENOL  8 HOUR ARTHRITIS PAIN) 650 MG CR tablet Take 3 tablets (1,950 mg total) by mouth every 8 (eight) hours as needed for pain. Do not exceed 4000 mg total in a day.  Home med. 04/21/23  Yes Garrison Kanner, MD   amLODipine  (NORVASC ) 10 MG tablet Take 1 tablet (10 mg total) by mouth daily. 11/10/23 12/25/23 Yes Sreenath, Sudheer B, MD  azelastine (ASTELIN) 0.1 % nasal spray Place 1 spray into both nostrils 2 (two) times daily. 10/22/23  Yes [provider]  diphenoxylate -atropine  (LOMOTIL ) 2.5-0.025 MG tablet Take 2 tablets by mouth every 6 (six) hours as needed (for diarrhea unrelieved by imodium). 12/21/23  Yes Nelda Balsam, NP  EPINEPHrine  0.3 mg/0.3 mL IJ SOAJ injection Inject 0.3 mg into the muscle as needed for anaphylaxis. 09/19/20  Yes Loman Risk, MD  fluticasone  (FLONASE ) 50 MCG/ACT nasal spray Place 1 spray into both nostrils daily. 09/29/23  Yes Patel, Sona, MD  fluticasone  furoate-vilanterol (BREO ELLIPTA ) 100-25 MCG/ACT AEPB Inhale 1 puff into the lungs daily. 09/29/23  Yes Patel, Sona, MD  furosemide  (LASIX ) 40 MG tablet Take 40 mg by mouth every other day. 03/23/20  Yes [provider]  gabapentin  (NEURONTIN ) 100 MG capsule Take 1 capsule (100 mg total) by mouth 3 (three) times daily. 12/11/23  Yes [name], MD  ipratropium-albuterol  (DUONEB) 0.5-2.5 (3) MG/3ML SOLN Take 3 mLs by nebulization every 4 (four) hours as needed (wheezing / shortness of breath). 09/29/23  Yes Patel, Sona, MD  levocetirizine (XYZAL) 5 MG tablet Take 5 mg by mouth daily. 10/21/23  Yes [provider]  magic mouthwash (lidocaine , diphenhydrAMINE , alum & mag hydroxide) suspension Swish and swallow 5 mLs 4 (four) times daily as needed for mouth pain. 12/18/23  Yes Nelda Balsam, NP  metFORMIN  (GLUCOPHAGE ) 1000 MG tablet Take 1,000 mg by mouth 2 (two) times daily. 11/27/22  Yes [provider]  morphine  (MS CONTIN ) 15 MG 12 hr tablet Take 1 tablet (15 mg total) by mouth every 12 (twelve) hours. 11/20/23  Yes Borders, Carlene Che, NP  naloxone  (NARCAN ) nasal spray 4 mg/0.1 mL SPRAY 1 SPRAY INTO ONE NOSTRIL AS DIRECTED FOR OPIOID OVERDOSE (TURN PERSON ON SIDE AFTER DOSE. IF NO RESPONSE IN 2-3 MINUTES OR  PERSON RESPONDS BUT RELAPSES, REPEAT USING A NEW SPRAY DEVICE AND SPRAY INTO THE OTHER NOSTRIL. CALL 911 AFTER USE.) * EMERGENCY USE ONLY * 11/20/23  Yes Borders, Carlene Che, NP  ondansetron  (ZOFRAN ) 8 MG tablet Take 1 tablet (8 mg total) by mouth every 8 (eight) hours as needed for nausea or vomiting. 11/17/23  Yes [name], MD  oxyCODONE  (OXY IR/ROXICODONE ) 5 MG immediate release tablet Take 1 tablet (5 mg total) by mouth every 4 (four) hours as needed for moderate pain (pain score 4-6) or severe pain (pain score 7-10). 12/04/23  Yes [name], MD  potassium chloride  SA (KLOR-CON  M) 20 MEQ tablet Take 1 tablet (20 mEq total) by mouth daily. 12/11/23  Yes [name], MD  prochlorperazine  (COMPAZINE ) 10 MG tablet Take 1 tablet (10 mg total) by mouth every 6 (six) hours as needed for nausea or vomiting. 11/17/23  Yes Wilhelmenia Harada,  Allyne Areola, MD  rOPINIRole  (REQUIP ) 2 MG tablet Take 2 mg by mouth in the morning and at bedtime.    Yes [provider]  Semaglutide, 1 MG/DOSE, 4 MG/3ML SOPN Inject 1 mg into the skin once a week. 10/29/23  Yes [provider]  VENTOLIN  HFA 108 (90 Base) MCG/ACT inhaler Inhale 2 puffs into the lungs every 4 (four) hours as needed for wheezing or shortness of breath. 09/29/23  Yes Melvinia Stager, MD    Physical Exam: Vitals:   12/25/23 1300 12/25/23 1515 12/25/23 1530 12/25/23 1600  BP: 98/70 115/80 111/72 98/62  Pulse:    (!) 135  Resp: 15 13 18 17   Temp:      TempSrc:      SpO2:    98%  Weight:      Height:       Physical Exam Vitals and nursing note reviewed.  Constitutional:      Appearance: She is ill-appearing.  HENT:     Head: Normocephalic and atraumatic.     Mouth/Throat:     Mouth: Mucous membranes are dry.     Pharynx: Oropharynx is clear.  Cardiovascular:     Rate and Rhythm: Regular rhythm. Tachycardia present.     Heart sounds: No murmur heard. Pulmonary:     Effort: Pulmonary effort is normal.     Breath sounds: No wheezing or rales.     Comments:  Diminished breath sounds throughout Abdominal:     General: There is no distension.     Palpations: Abdomen is soft.     Tenderness: There is no abdominal tenderness.     Comments: Diminished bowel sounds  Musculoskeletal:     Comments: +2 pitting edema of bilateral lower extremities with notably increased warmth to touch mild erythema.  No wounds.  Skin:    General: Skin is warm and dry.  Neurological:     Mental Status: She is alert and oriented to person, place, and time. Mental status is at baseline.  Psychiatric:        Mood and Affect: Mood normal.        Behavior: Behavior normal.    Data Reviewed: CBC with WBC 24.6, hemoglobin of 10.7, platelets 164 CMP with sodium of 131, potassium 3.0, bicarb 24, glucose 219, BUN 29, creatinine 1.18, AST 24, ALT 15, GFR 51 BNP 109 Troponin 1214 Lactic acid 1.6 and then 2.0 INR 1.2 COVID-19, influenza and RSV PCR negative  EKG personally reviewed.  Narrow complex tachycardia most likely sinus tachycardia although P waves are difficult to see.  No acute ischemic changes.  CT CHEST WO CONTRAST Result Date: 12/25/2023 CLINICAL DATA:  Bilateral lower extremity edema. History of pancreatic cancer. EXAM: CT CHEST WITHOUT CONTRAST TECHNIQUE: Multidetector CT imaging of the chest was performed following the standard protocol without IV contrast. RADIATION DOSE REDUCTION: This exam was performed according to the departmental dose-optimization program which includes automated exposure control, adjustment of the mA and/or kV according to patient size and/or use of iterative reconstruction technique. COMPARISON:  August 21, 2023. FINDINGS: Cardiovascular: Atherosclerosis of thoracic aorta without aneurysm formation. Mild cardiomegaly. No pericardial effusion. Right internal jugular Port-A-Cath is noted. Mediastinum/Nodes: No enlarged mediastinal or axillary lymph nodes. Thyroid gland, trachea, and esophagus demonstrate no significant findings. Lungs/Pleura:  No pneumothorax or pleural effusion is noted. Right lung is clear. 3.5 x 2.6 cm cavitating lesion is noted in left lower lobe which may represent cavitary pneumonia or possibly metastatic disease. Upper Abdomen: Interval development of  multiple rounded low densities throughout the left and right hepatic lobes consistent with metastatic disease. Pancreatic mass is again noted consistent with history of pancreatic malignancy. Musculoskeletal: No chest wall mass or suspicious bone lesions identified. IMPRESSION: Interval development of multiple rounded hepatic low densities consistent with metastatic disease. Pancreatic mass is again noted consistent with history of pancreatic malignancy. 3.5 x 2.6 cm cavitating lesion is noted in left lower lobe which may represent cavitary pneumonia or possibly metastatic disease. Electronically Signed   By: Rosalene Colon M.D.   On: 12/25/2023 13:53   CT ABDOMEN PELVIS W CONTRAST Result Date: 12/25/2023 CLINICAL DATA:  Sepsis. * Tracking Code: BO * EXAM: CT ABDOMEN AND PELVIS WITH CONTRAST TECHNIQUE: Multidetector CT imaging of the abdomen and pelvis was performed using the standard protocol following bolus administration of intravenous contrast. RADIATION DOSE REDUCTION: This exam was performed according to the departmental dose-optimization program which includes automated exposure control, adjustment of the mA and/or kV according to patient size and/or use of iterative reconstruction technique. CONTRAST:  OMNIPAQUE  IOHEXOL  300 MG/ML  SOLN COMPARISON:  CT scan abdomen and pelvis from 11/05/2023. FINDINGS: Lower chest: Since the prior study, there is a new thick-walled cavitary lesion in the left lung lower lobe measuring 2.4 x 2.8 cm. Visualized bilateral lung bases are otherwise clear. No pleural effusion. Normal heart size. No pericardial effusion. Hepatobiliary: The liver is normal in size. Non-cirrhotic configuration. Redemonstration of multiple (more than 25),  hypoattenuating liver lesions with dominant lesion in the right hepatic lobe measuring up to 2.2 x 2.3 cm, which previously measured up to 1.5 x 1.8 cm. Findings are compatible with worsening metastases. No intrahepatic or extrahepatic bile duct dilation. Gallbladder is surgically absent. Pancreas: Redemonstration of ill-defined heterogeneous predominantly hypoattenuating mass centered within the pancreatic tail and distal body measuring approximately 4.2 x 7.4 cm, grossly unchanged since the prior study and highly concerning for pancreatic neoplasm. Rest of the pancreas is unremarkable. Main pancreatic duct is not dilated. No peripancreatic fat stranding. 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 Spleen: Normal size spleen. Redemonstration of a hypoattenuating 2.2 x 2.6 cm lesion along the superolateral aspect, which has increased in size since the prior study. There is a probable new sub 5 mm subcapsular focus in the posterosuperior aspect, which is concerning for new metastases. Adrenals/Urinary Tract: Adrenal glands are unremarkable. No suspicious renal mass. No hydronephrosis. No renal or ureteric calculi. Unremarkable urinary bladder. Stomach/Bowel: No disproportionate dilation of the small or large bowel loops. No evidence of abnormal bowel wall thickening or inflammatory changes. The appendix is unremarkable. There are multiple diverticula mainly in the sigmoid colon, without imaging signs of diverticulitis. Vascular/Lymphatic: No ascites or pneumoperitoneum. No abdominal or pelvic lymphadenopathy, by size criteria. No aneurysmal dilation of the major abdominal arteries. There are mild peripheral atherosclerotic vascular calcifications of the aorta and its major branches. Reproductive: The uterus is surgically absent. No large adnexal mass. Other: Note is again made of thinned/lax anterior abdominal wall. There is a small fat containing right  paramedian periumbilical hernia. The soft tissues and abdominal wall are otherwise unremarkable. Musculoskeletal: No suspicious osseous lesions. There are moderate multilevel degenerative changes in the visualized spine. Bilateral marked hip joint arthritis noted IMPRESSION: 1. Redemonstration of ill-defined heterogeneous hypoattenuating mass centered within the pancreatic tail and distal body, highly concerning for pancreatic neoplasm. 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 2. Redemonstration of multiple hypoattenuating liver lesions, which have increased in size since the prior study, compatible with worsening metastases. 3. There is a new thick-walled cavitary lesion in the left lung lower lobe measuring 2.4 x 2.8 cm. This is indeterminate and differential diagnosis includes metastases versus infectious/inflammatory etiology. Consider dedicated chest imaging. 4. Multiple other nonacute observations, as described above. Aortic Atherosclerosis (ICD10-I70.0). Electronically Signed   By: Beula Brunswick M.D.   On: 12/25/2023 11:37   DG Chest Port 1 View Result Date: 12/25/2023 CLINICAL DATA:  Suspected sepsis EXAM: PORTABLE CHEST 1 VIEW COMPARISON:  09/28/2023 FINDINGS: Numerous leads and wires project over the chest. Patient is rotated to the left. Mildly degraded exam due to AP portable technique and patient body habitus. Mild cardiomegaly. No right-sided pleural effusion. Left costophrenic angle is obscured by overlying breast tissue. IMPRESSION: Degraded exam secondary to AP portable technique and patient body habitus. Especially the inferior left hemithorax is poorly evaluated secondary to overlying soft tissues. Given this limitation, cardiomegaly and low lung volumes without acute disease. If there is a clinical concern of left lower lobe pneumonia, recommend PA and lateral radiographs. Electronically Signed   By: Lore Rode M.D.   On:  12/25/2023 10:51   Results are pending, will review when available.  Assessment and Plan:  * Severe sepsis (HCC) Patient is presenting with severe tachycardia, hypotension, leukocytosis concern for sepsis, with suspected sources being either GI versus pulmonary.  She has been experiencing diarrhea/vomiting for 1 week and a new productive cough for 1 day.  - Telemetry monitoring - S/p 2.1 L bolus.  Continue maintenance fluids - Blood cultures pending - Continue cefepime, vancomycin , Flagyl - Continue to trend lactic acid  Acute heart failure (HCC) No prior history of CHF, endorsing severe worsening of bilateral lower extremity edema with elevated BNP despite morbid obesity.  Unfortunately, she is needing IV fluid resuscitation for sepsis and associated hypotension.  Very high risk for pulmonary edema.  - Daily weights - Strict and out - Hold off on diuretics - Echocardiogram ordered  Cavitary lesion of lung During evaluation for sepsis, a new cavitary lesion in the left lower lobe was discovered, measuring 3.5 x 2.6 cm.  Given new cough, this may be a possible source of her sepsis as she is immuno compromised in the setting of chemotherapy or may be new metastasis of her known stage IV pancreatic cancer.  - Antibiotics as noted above - Expectorated sputum culture, strep pneumo and Legionella urinary antigens - Consider pulmonology consultation  Gastroenteritis Patient presenting with 1 week of vomiting and intermittently melanotic diarrhea.  Potentially secondary to recent chemotherapy, however will need to evaluate for infectious etiology as well. No evidence of colitis on imaging  - GI panel ordered - IV fluids as noted above - Zofran  as needed  AKI (acute kidney injury) (HCC) Mild AKI in the setting of hypovolemia and sepsis.  - IV fluids as ordered - Hold nephrotoxic agents - Repeat BMP in the a.m.  Pancreatic cancer metastasized to liver [facility]) History of recently  diagnosed stage IV pancreatic cancer with metastasis to the liver on chemotherapy.She last received paclitaxel  and gemcitabine  on April 25.  Chronic obstructive pulmonary disease (COPD) (HCC) Patient has a history of mixed COPD/asthma with multiple recent exacerbations, however no wheezing on examination at this time and she notes her shortness of breath is at baseline.  - Given high risk, will schedule DuoNebs every 6 hours - Continue home bronchodilators - Pulmonary  toilet  Type 2 diabetes mellitus (HCC) Last A1c of 8.7% approximately 4 months ago.  - Hold home regimen - A1c  Advance Care Planning:   Code Status: Full Code verified by patient  Consults: None  Family Communication: No family at bedside  Severity of Illness: The appropriate patient status for this patient is INPATIENT. Inpatient status is judged to be reasonable and necessary in order to provide the required intensity of service to ensure the patient's safety. The patient's presenting symptoms, physical exam findings, and initial radiographic and laboratory data in the context of their chronic comorbidities is felt to place them at high risk for further clinical deterioration. Furthermore, it is not anticipated that the patient will be medically stable for discharge from the hospital within 2 midnights of admission.   * I certify that at the point of admission it is my clinical judgment that the patient will require inpatient hospital care spanning beyond 2 midnights from the point of admission due to high intensity of service, high risk for further deterioration and high frequency of surveillance required.*  Author: Avi Body, MD 12/25/2023 4:31 PM  For on call review [email or web address].

## 2023-12-25 NOTE — Assessment & Plan Note (Signed)
 Last A1c of 8.7% approximately 4 months ago.  - Hold home regimen - A1c

## 2023-12-26 ENCOUNTER — Inpatient Hospital Stay (HOSPITAL_COMMUNITY)
Admit: 2023-12-26 | Discharge: 2023-12-26 | Disposition: A | Discharge status: Home / Self Care | Attending: Internal Medicine

## 2023-12-26 ENCOUNTER — Encounter: Payer: Self-pay | Admitting: Internal Medicine

## 2023-12-26 DIAGNOSIS — A419 Sepsis, unspecified organism: Secondary | ICD-10-CM | POA: Diagnosis not present

## 2023-12-26 DIAGNOSIS — I5031 Acute diastolic (congestive) heart failure: Secondary | ICD-10-CM | POA: Diagnosis not present

## 2023-12-26 DIAGNOSIS — I483 Typical atrial flutter: Secondary | ICD-10-CM

## 2023-12-26 DIAGNOSIS — I509 Heart failure, unspecified: Secondary | ICD-10-CM | POA: Diagnosis not present

## 2023-12-26 DIAGNOSIS — I4891 Unspecified atrial fibrillation: Secondary | ICD-10-CM

## 2023-12-26 DIAGNOSIS — N179 Acute kidney failure, unspecified: Secondary | ICD-10-CM | POA: Diagnosis not present

## 2023-12-26 DIAGNOSIS — L8990 Pressure ulcer of unspecified site, unspecified stage: Secondary | ICD-10-CM | POA: Insufficient documentation

## 2023-12-26 LAB — CBC WITH DIFFERENTIAL/PLATELET
Abs Immature Granulocytes: 5.62 10*3/uL — ABNORMAL HIGH (ref 0.00–0.07)
Basophils Absolute: 0.1 10*3/uL (ref 0.0–0.1)
Basophils Relative: 0 %
Eosinophils Absolute: 0 10*3/uL (ref 0.0–0.5)
Eosinophils Relative: 0 %
HCT: 29.6 % — ABNORMAL LOW (ref 36.0–46.0)
Hemoglobin: 10 g/dL — ABNORMAL LOW (ref 12.0–15.0)
Immature Granulocytes: 22 %
Lymphocytes Relative: 11 %
Lymphs Abs: 2.8 10*3/uL (ref 0.7–4.0)
MCH: 27.6 pg (ref 26.0–34.0)
MCHC: 33.8 g/dL (ref 30.0–36.0)
MCV: 81.8 fL (ref 80.0–100.0)
Monocytes Absolute: 3.7 10*3/uL — ABNORMAL HIGH (ref 0.1–1.0)
Monocytes Relative: 14 %
Neutro Abs: 13.3 10*3/uL — ABNORMAL HIGH (ref 1.7–7.7)
Neutrophils Relative %: 53 %
Platelets: 167 10*3/uL (ref 150–400)
RBC: 3.62 MIL/uL — ABNORMAL LOW (ref 3.87–5.11)
RDW: 15.3 % (ref 11.5–15.5)
Smear Review: NORMAL
WBC: 25.5 10*3/uL — ABNORMAL HIGH (ref 4.0–10.5)
nRBC: 2.6 % — ABNORMAL HIGH (ref 0.0–0.2)

## 2023-12-26 LAB — APTT: aPTT: 40 s — ABNORMAL HIGH (ref 24–36)

## 2023-12-26 LAB — GLUCOSE, CAPILLARY
Glucose-Capillary: 134 mg/dL — ABNORMAL HIGH (ref 70–99)
Glucose-Capillary: 149 mg/dL — ABNORMAL HIGH (ref 70–99)
Glucose-Capillary: 150 mg/dL — ABNORMAL HIGH (ref 70–99)
Glucose-Capillary: 206 mg/dL — ABNORMAL HIGH (ref 70–99)
Glucose-Capillary: 216 mg/dL — ABNORMAL HIGH (ref 70–99)

## 2023-12-26 LAB — COMPREHENSIVE METABOLIC PANEL WITH GFR
ALT: 13 U/L (ref 0–44)
AST: 23 U/L (ref 15–41)
Albumin: 2.4 g/dL — ABNORMAL LOW (ref 3.5–5.0)
Alkaline Phosphatase: 85 U/L (ref 38–126)
Anion gap: 8 (ref 5–15)
BUN: 21 mg/dL (ref 8–23)
CO2: 25 mmol/L (ref 22–32)
Calcium: 7.7 mg/dL — ABNORMAL LOW (ref 8.9–10.3)
Chloride: 99 mmol/L (ref 98–111)
Creatinine, Ser: 0.97 mg/dL (ref 0.44–1.00)
GFR, Estimated: >60 mL/min (ref >60)
Glucose, Bld: 207 mg/dL — ABNORMAL HIGH (ref 70–99)
Potassium: 3.9 mmol/L (ref 3.5–5.1)
Sodium: 132 mmol/L — ABNORMAL LOW (ref 135–145)
Total Bilirubin: 1 mg/dL (ref 0.0–1.2)
Total Protein: 6 g/dL — ABNORMAL LOW (ref 6.5–8.1)

## 2023-12-26 LAB — HEMOGLOBIN A1C
Hgb A1c MFr Bld: 10.9 % — ABNORMAL HIGH (ref 4.8–5.6)
Mean Plasma Glucose: 266.13 mg/dL

## 2023-12-26 LAB — MAGNESIUM: Magnesium: 2.1 mg/dL (ref 1.7–2.4)

## 2023-12-26 LAB — ECHOCARDIOGRAM COMPLETE
Area-P 1/2: 4.46 cm2
Height: 62 in
S' Lateral: 3.4 cm
Weight: 3971.81 [oz_av]

## 2023-12-26 LAB — PROTIME-INR
INR: 1.3 — ABNORMAL HIGH (ref 0.8–1.2)
Prothrombin Time: 16.8 s — ABNORMAL HIGH (ref 11.4–15.2)

## 2023-12-26 LAB — HEPARIN LEVEL (UNFRACTIONATED): Heparin Unfractionated: >1.1 [IU]/mL — ABNORMAL HIGH (ref 0.30–0.70)

## 2023-12-26 LAB — D-DIMER, QUANTITATIVE: D-Dimer, Quant: 3.5 ug{FEU}/mL — ABNORMAL HIGH (ref 0.00–0.50)

## 2023-12-26 LAB — CEA: CEA: 136 ng/mL — ABNORMAL HIGH (ref 0.0–4.7)

## 2023-12-26 LAB — PHOSPHORUS: Phosphorus: 2.5 mg/dL (ref 2.5–4.6)

## 2023-12-26 LAB — LACTIC ACID, PLASMA: Lactic Acid, Venous: 1.8 mmol/L (ref 0.5–1.9)

## 2023-12-26 MED ORDER — HEPARIN (PORCINE) 25000 UT/250ML-% IV SOLN
1100.0000 [IU]/h | INTRAVENOUS | Status: DC
  Administered 2023-12-26: 1100 [IU]/h via INTRAVENOUS
  Filled 2023-12-26: qty 250

## 2023-12-26 MED ORDER — ADENOSINE 6 MG/2ML IV SOLN
6.0000 mg | Freq: Once | INTRAVENOUS | Status: AC
  Administered 2023-12-26: 6 mg via INTRAVENOUS
  Filled 2023-12-26: qty 2

## 2023-12-26 MED ORDER — HEPARIN (PORCINE) 25000 UT/250ML-% IV SOLN
1000.0000 [IU]/h | INTRAVENOUS | Status: DC
  Administered 2023-12-26: 850 [IU]/h via INTRAVENOUS
  Administered 2023-12-27 – 2023-12-28 (×2): 1000 [IU]/h via INTRAVENOUS
  Filled 2023-12-26 (×2): qty 250

## 2023-12-26 MED ORDER — DIGOXIN 0.25 MG/ML IJ SOLN
0.2500 mg | Freq: Once | INTRAMUSCULAR | Status: AC
  Administered 2023-12-26: 0.25 mg via INTRAVENOUS
  Filled 2023-12-26: qty 2

## 2023-12-26 MED ORDER — METOPROLOL TARTRATE 25 MG PO TABS
12.5000 mg | ORAL_TABLET | Freq: Four times a day (QID) | ORAL | Status: DC
  Administered 2023-12-26 – 2023-12-29 (×13): 12.5 mg via ORAL
  Filled 2023-12-26 (×13): qty 1

## 2023-12-26 MED ORDER — HEPARIN BOLUS VIA INFUSION
3800.0000 [IU] | Freq: Once | INTRAVENOUS | Status: AC
  Administered 2023-12-26: 3800 [IU] via INTRAVENOUS
  Filled 2023-12-26: qty 3800

## 2023-12-26 MED ORDER — OXYCODONE HCL 5 MG PO TABS
5.0000 mg | ORAL_TABLET | Freq: Four times a day (QID) | ORAL | Status: DC | PRN
  Administered 2023-12-26 – 2023-12-29 (×3): 5 mg via ORAL
  Filled 2023-12-26 (×3): qty 1

## 2023-12-26 MED ORDER — MEDIHONEY WOUND/BURN DRESSING EX PSTE
1.0000 | PASTE | Freq: Every day | CUTANEOUS | Status: DC
  Administered 2023-12-26 – 2023-12-30 (×5): 1 via TOPICAL
  Filled 2023-12-26 (×2): qty 44

## 2023-12-26 MED ORDER — IPRATROPIUM-ALBUTEROL 0.5-2.5 (3) MG/3ML IN SOLN: 3.0000 mL | Freq: Four times a day (QID) | RESPIRATORY_TRACT | Status: DC | PRN

## 2023-12-26 MED ORDER — METOPROLOL TARTRATE 5 MG/5ML IV SOLN
2.5000 mg | Freq: Once | INTRAVENOUS | Status: AC
  Administered 2023-12-26: 2.5 mg via INTRAVENOUS
  Filled 2023-12-26: qty 5

## 2023-12-26 MED ORDER — DIGOXIN 0.25 MG/ML IJ SOLN
0.2500 mg | Freq: Once | INTRAMUSCULAR | Status: AC
Start: 2023-12-26 — End: 2023-12-26
  Administered 2023-12-26: 0.25 mg via INTRAVENOUS
  Filled 2023-12-26: qty 2

## 2023-12-26 NOTE — Consult Note (Signed)
 PHARMACY - ANTICOAGULATION CONSULT NOTE  Pharmacy Consult for Heparin  Indication: Atrial Flutter   Allergies  Allergen Reactions   Peanut-Containing Drug Products    Patient Measurements: Height: 5\' 2"  (157.5 cm) Weight: 112.6 kg (248 lb 3.8 oz) IBW/kg (Calculated) : 50.1 HEPARIN  DW (KG): 77.6  Vital Signs: Temp: 98.3 F (36.8 C) (05/10 1502) BP: 111/75 (05/10 1502) Pulse Rate: 122 (05/10 1502)  Labs: Recent Labs    12/25/23 0816 12/25/23 0945 12/25/23 0946 12/25/23 1140 12/26/23 0300 12/26/23 1043 12/26/23 1830  HGB 10.9*  --  10.7*  --  10.0*  --   --   HCT 32.0*  --  31.3*  --  29.6*  --   --   PLT 189  --  164  --  167  --   --   APTT  --   --   --   --   --  40*  --   LABPROT  --  15.8*  --   --   --  16.8*  --   INR  --  1.2  --   --   --  1.3*  --   HEPARINUNFRC  --   --   --   --   --   --  >1.10*  CREATININE 1.33*  --  1.18*  --  0.97  --   --   TROPONINIHS  --   --  12 14  --   --   --    Estimated Creatinine Clearance: 68.6 mL/min (by C-G formula based on SCr of 0.97 mg/dL).  Medical History: Past Medical History:  Diagnosis Date   Anemia    Asthma    Back pain    Cataract    Diabetes mellitus without complication (HCC)    GERD (gastroesophageal reflux disease)    History of echocardiogram    Hypertension    Morbid obesity with BMI of 60.0-69.9, adult (HCC)    Restless leg syndrome    Medications:  No PTA anticoagulation  DVT prophylactic LMWH given 5/9 @ 2013   Assessment: 63 year old female found to be in atrial flutter with RVR. From chart review, patient was not on any anticoagulation prior to admission. Pharmacy has been consulted for initiation and management of a heparin  infusion. Baseline labs: Hgb 10.0, PLT 167, aPTT & INR have been ordered.   Goal of Therapy:  Heparin  level 0.3-0.7 units/ml Monitor platelets by anticoagulation protocol: Yes  0510 @ 1830: HL >1.10 = Supratherapeutic   Plan: Hold heparin  infusion for one  hour After holding, restart heparin  infusion at reduced rate of 850 units/hr Check heparin  level 6 hours after restart CBC daily while on heparin  infusion F/u transition to PO anticoagulation if indicated   Will M. Alva Jewels, PharmD Clinical Pharmacist 12/26/2023 7:37 PM

## 2023-12-26 NOTE — Consult Note (Signed)
 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    POC discussed with bedside nurse. WOC team will not follow. Re-consult if further needs arise.   Thank you,    Ronni Colace MSN, RN-BC, [employer] [phone]

## 2023-12-26 NOTE — Plan of Care (Signed)
  Problem: Fluid Volume: Goal: Hemodynamic stability will improve Outcome: Progressing   Problem: Clinical Measurements: Goal: Diagnostic test results will improve Outcome: Progressing   Problem: Clinical Measurements: Goal: Signs and symptoms of infection will decrease Outcome: Progressing   Problem: Activity: Goal: Risk for activity intolerance will decrease Outcome: Progressing   Problem: Coping: Goal: Level of anxiety will decrease Outcome: Progressing   Problem: Skin Integrity: Goal: Risk for impaired skin integrity will decrease Outcome: Progressing   Problem: Skin Integrity: Goal: Risk for impaired skin integrity will decrease Outcome: Progressing   Problem: Tissue Perfusion: Goal: Adequacy of tissue perfusion will improve Outcome: Progressing    Plan of care ongoing, see MAR, see flowsheet

## 2023-12-26 NOTE — Consult Note (Signed)
 PHARMACY - ANTICOAGULATION CONSULT NOTE  Pharmacy Consult for Heparin  Indication: Atrial Flutter   Allergies  Allergen Reactions   Peanut-Containing Drug Products    Patient Measurements: Height: 5\' 2"  (157.5 cm) Weight: 112.6 kg (248 lb 3.8 oz) IBW/kg (Calculated) : 50.1 HEPARIN  DW (KG): 77.6  Vital Signs: Temp: 97.9 F (36.6 C) (05/10 0750) Temp Source: Oral (05/10 0235) BP: 91/66 (05/10 0750) Pulse Rate: 141 (05/10 0750)  Labs: Recent Labs    12/25/23 0816 12/25/23 0945 12/25/23 0946 12/25/23 1140 12/26/23 0300  HGB 10.9*  --  10.7*  --  10.0*  HCT 32.0*  --  31.3*  --  29.6*  PLT 189  --  164  --  167  LABPROT  --  15.8*  --   --   --   INR  --  1.2  --   --   --   CREATININE 1.33*  --  1.18*  --  0.97  TROPONINIHS  --   --  12 14  --    Estimated Creatinine Clearance: 68.6 mL/min (by C-G formula based on SCr of 0.97 mg/dL).  Medical History: Past Medical History:  Diagnosis Date   Anemia    Asthma    Back pain    Cataract    Diabetes mellitus without complication (HCC)    GERD (gastroesophageal reflux disease)    History of echocardiogram    Hypertension    Morbid obesity with BMI of 60.0-69.9, adult (HCC)    Restless leg syndrome    Medications:  No PTA anticoagulation  DVT prophylactic LMWH given 5/9 @ 2013   Assessment: 63 year old female found to be in atrial flutter with RVR. From chart review, patient was not on any anticoagulation prior to admission. Pharmacy has been consulted for initiation and management of a heparin  infusion. Baseline labs: Hgb 10.0, PLT 167, aPTT & INR have been ordered.   Goal of Therapy:  Heparin  level 0.3-0.7 units/ml Monitor platelets by anticoagulation protocol: Yes   Plan:  Give 3800 unit bolus x 1 Start heparin  infusion at 1100 units/hr Check HL 6 hours after start of infusion Monitor CBC daily and for s/sx of bleeding  F/u transition to PO anticoagulation if indicated   Pansy Bogus, PharmD Pharmacy  Resident  12/26/2023 9:17 AM

## 2023-12-26 NOTE — Consult Note (Signed)
 Cardiology Consult    Patient ID: Kristina Cruz MRN: [id number], DOB/AGE: 1961-06-22   Admit date: 12/25/2023 Date of Consult: 12/26/2023  Primary Physician: Chucky Craver, FNP Primary Cardiologist: Belva Boyden, MD Requesting Provider: V. Mason Sole, MD  Patient Profile    Kristina Cruz is a 63 y.o. female with a history of stage V pancreatic cancer w/ liver mets on weekly chemoRx, DMII, HTN, obesity, asthma, and COPD, who is being seen today for the evaluation of rapid atrial flutter at the request of Dr. Mason Sole.  Past Medical History   Subjective  Past Medical History:  Diagnosis Date   Anemia    Asthma    Back pain    Cataract    Diabetes mellitus without complication (HCC)    GERD (gastroesophageal reflux disease)    History of echocardiogram    Hypertension    Morbid obesity with BMI of 60.0-69.9, adult (HCC)    Restless leg syndrome     Past Surgical History:  Procedure Laterality Date   ABDOMINAL HYSTERECTOMY     CATARACT EXTRACTION W/PHACO Left 04/13/2020   Procedure: CATARACT EXTRACTION PHACO AND INTRAOCULAR LENS PLACEMENT (IOC);  Surgeon: Ola Berger, MD;  Location: [facility] ORS;  Service: Ophthalmology;  Laterality: Left;  US  00:36.0 CDE 5.95 Fluid Pack lot # [id number]   HYSTEROSCOPY WITH D & C N/A 07/14/2017   Procedure: DILATATION AND CURETTAGE /HYSTEROSCOPY;  Surgeon: Alben Alma, MD;  Location: [facility] OR[facility];  Service: Gynecology;  Laterality: N/A;   IR IMAGING GUIDED PORT INSERTION  12/17/2023   POLYPECTOMY  2015     Allergies  Allergies  Allergen Reactions   Peanut-Containing Drug Products        History of Present Illness   63 y.o. female with a history of stage V pancreatic cancer w/ liver mets on weekly chemoRx, DMII, HTN, obesity, asthma, and COPD.  She has no prior cardiac history.  Prior echo in 01/2023, showed EF of 60-65%, no rwma, mild LVH, nl RV size/fxn, and no significant valvular dzs.  Patient says that over the past 2 weeks, she has  been experiencing significant diarrhea.  This is progressed over the past week and has been associated with anorexia and nausea.  In that setting, p.o. intake has been poor.  She presented to her oncology appointment on May 9 for planned chemotherapy however, she was noted to be tachycardic in the 140s and was referred to the emergency department.  On arrival to the ED, she was afebrile and hypotensive with pressures dipping into the 70s.  Heart rate was elevated at 140.  ECG showed a wide-complex tachycardia in the setting of known right bundle branch block, at 140 bpm.  Lab work notable for sodium of 130, potassium of 3.0, chloride of 91, BUN 28, creatinine 1.33, calcium  8.0, albumin  2.8, WBC 24.4, H&H 10.9/32.0, A1c 10.9, D-dimer 3.5.  CT abdomen and pelvis with pancreatic mass, liver lesions, and new thick-walled cavitary lesion in the left lung lower lobe measuring 2.4 x 2.8 cm.  CT chest showed interval development of multiple rounded hepatic low-density's consistent with metastatic disease, pancreatic mass, and left lower lobe cavitary lesion with question of cavitary pneumonia versus metastatic disease.  Patient was placed on intravenous antibiotics and provided IV fluid boluses followed by lactated Ringer's infusion.  She refused chest CTA.  [name] has remained tachycardic with rates between 142 and 144.  Though she has occasionally noted palpitations in the past, she was not aware of palpitations prior to  admission or since admission.  She denies chest pain.  She has some degree of chronic dyspnea.  Following adenosine administration this morning, she was noted to have flutter waves with heart rate dropping from 144 briefly to the 100 range before increasing again into the 1 teens to 130s with evidence of atrial fibrillation..  Inpatient Medications   Subjective    adenosine (ADENOCARD) IV  6 mg Intravenous Once   fluticasone  furoate-vilanterol  1 puff Inhalation Daily   gabapentin   100 mg  Oral TID   heparin   3,800 Units Intravenous Once   insulin  aspart  0-20 Units Subcutaneous TID WC   leptospermum manuka honey  1 Application Topical Daily   metoprolol tartrate  12.5 mg Oral Q6H   rOPINIRole   2 mg Oral BID   sodium chloride  flush  3 mL Intravenous Q12H    Family History    Family History  Problem Relation Age of Onset   Breast cancer Mother 70   Diabetes Mother    Hypertension Mother    Ovarian cancer Paternal Aunt        ?   Diabetes Father    Hypertension Father    She indicated that the status of her mother is unknown. She indicated that the status of her father is unknown. She indicated that the status of her paternal aunt is unknown.   Social History    Social History   Socioeconomic History   Marital status: Single    Spouse name: Not on file   Number of children: Not on file   Years of education: Not on file   Highest education level: Not on file  Occupational History   Not on file  Tobacco Use   Smoking status: Never   Smokeless tobacco: Never  Vaping Use   Vaping status: Never Used  Substance and Sexual Activity   Alcohol use: No   Drug use: No   Sexual activity: Never    Birth control/protection: None  Other Topics Concern   Not on file  Social History Narrative   Not on file   Social Drivers of Health   Financial Resource Strain: Low Risk  (09/26/2022)   Received from [facility], [facility]   Overall Financial Resource Strain (CARDIA)    Difficulty of Paying Living Expenses: Not hard at all  Food Insecurity: No Food Insecurity (12/25/2023)   Hunger Vital Sign    Worried About Running Out of Food in the Last Year: Never true    Ran Out of Food in the Last Year: Never true  Transportation Needs: No Transportation Needs (12/25/2023)   PRAPARE - Administrator, Civil Service (Medical): No    Lack of Transportation (Non-Medical): No  Recent Concern: Transportation Needs - Unmet  Transportation Needs (09/28/2023)   PRAPARE - Administrator, Civil Service (Medical): Yes    Lack of Transportation (Non-Medical): Yes  Physical Activity: Not on file  Stress: Not on file  Social Connections: Socially Isolated (12/25/2023)   Social Connection and Isolation Panel [NHANES]    Frequency of Communication with Friends and Family: More than three times a week    Frequency of Social Gatherings with Friends and Family: More than three times a week    Attends Religious Services: Never    Database administrator or Organizations: No    Attends Banker Meetings: Never    Marital Status: Never married  Intimate Partner Violence: Not  At Risk (12/25/2023)   Humiliation, Afraid, Rape, and Kick questionnaire    Fear of Current or Ex-Partner: No    Emotionally Abused: No    Physically Abused: No    Sexually Abused: No     Review of Systems    General:  +++ Malaise.  No chills, fever, night sweats or weight changes.  Cardiovascular:  No chest pain, dyspnea on exertion, edema, orthopnea, palpitations, paroxysmal nocturnal dyspnea. Dermatological: No rash, lesions/masses Respiratory: No cough, dyspnea Urologic: No hematuria, dysuria Abdominal:   +++ Anorexia, +++ nausea, +++ diarrhea, no bright red blood per rectum, melena, or hematemesis Neurologic:  No visual changes, +++ generalized wkns, no changes in mental status. All other systems reviewed and are otherwise negative except as noted above.     Objective   Physical Exam    Blood pressure 97/61, pulse (!) 54, temperature 98.5 F (36.9 C), resp. rate 16, height 5\' 2"  (1.575 m), weight 112.6 kg, last menstrual period 01/04/2018, SpO2 99%.  General: Pleasant, NAD Psych: Normal affect. Neuro: Alert and oriented X 3. Moves all extremities spontaneously. HEENT: Normal  Neck: Supple without bruits or JVD. Lungs:  Resp regular and unlabored, CTA. Heart: RRR, tachycardic, no s3, s4, or murmurs. Abdomen: Soft,  non-tender, non-distended, BS + x 4.  Extremities: No clubbing, cyanosis.  1+ bilateral lower extremity edema. DP/PT2+, Radials 2+ and equal bilaterally.  Labs    Cardiac Enzymes Recent Labs  Lab 12/25/23 0946 12/25/23 1140  TROPONINIHS 12 14     BNP    Component Value Date/Time   BNP 109.6 (H) 12/25/2023 0946   Lab Results  Component Value Date   WBC 25.5 (H) 12/26/2023   HGB 10.0 (L) 12/26/2023   HCT 29.6 (L) 12/26/2023   MCV 81.8 12/26/2023   PLT 167 12/26/2023    Recent Labs  Lab 12/26/23 0300  NA 132*  K 3.9  CL 99  CO2 25  BUN 21  CREATININE 0.97  CALCIUM  7.7*  PROT 6.0*  BILITOT 1.0  ALKPHOS 85  ALT 13  AST 23  GLUCOSE 207*                               Lab Results  Component Value Date   DDIMER 3.50 (H) 12/26/2023      Radiology Studies    US  Venous Img Lower Bilateral (DVT) Result Date: 12/25/2023 CLINICAL DATA:  Lower extremity edema for 2 weeks EXAM: Bilateral Lower Extremity Venous Doppler Ultrasound TECHNIQUE: Gray-scale sonography with compression, as well as color and duplex ultrasound, were performed to evaluate the deep venous system(s) from the level of the common femoral vein through the popliteal and proximal calf veins. COMPARISON:  None available FINDINGS: VENOUS Normal compressibility of the common femoral, superficial femoral, and popliteal veins, as well as the visualized calf veins. Visualized portions of profunda femoral vein and great saphenous vein unremarkable. No filling defects to suggest DVT on grayscale or color Doppler imaging. Doppler waveforms show normal direction of venous flow, normal respiratory plasticity and response to augmentation. OTHER None. Limitations: none IMPRESSION: No lower extremity DVT. Electronically Signed   By: Elester Grim M.D.   On: 12/25/2023 17:29   CT CHEST WO CONTRAST Result Date: 12/25/2023 CLINICAL DATA:  Bilateral lower extremity edema. History of pancreatic cancer. EXAM: CT CHEST WITHOUT CONTRAST  TECHNIQUE: Multidetector CT imaging of the chest was performed following the standard protocol without IV contrast. RADIATION DOSE  REDUCTION: This exam was performed according to the departmental dose-optimization program which includes automated exposure control, adjustment of the mA and/or kV according to patient size and/or use of iterative reconstruction technique. COMPARISON:  August 21, 2023. FINDINGS: Cardiovascular: Atherosclerosis of thoracic aorta without aneurysm formation. Mild cardiomegaly. No pericardial effusion. Right internal jugular Port-A-Cath is noted. Mediastinum/Nodes: No enlarged mediastinal or axillary lymph nodes. Thyroid gland, trachea, and esophagus demonstrate no significant findings. Lungs/Pleura: No pneumothorax or pleural effusion is noted. Right lung is clear. 3.5 x 2.6 cm cavitating lesion is noted in left lower lobe which may represent cavitary pneumonia or possibly metastatic disease. Upper Abdomen: Interval development of multiple rounded low densities throughout the left and right hepatic lobes consistent with metastatic disease. Pancreatic mass is again noted consistent with history of pancreatic malignancy. Musculoskeletal: No chest wall mass or suspicious bone lesions identified. IMPRESSION: Interval development of multiple rounded hepatic low densities consistent with metastatic disease. Pancreatic mass is again noted consistent with history of pancreatic malignancy. 3.5 x 2.6 cm cavitating lesion is noted in left lower lobe which may represent cavitary pneumonia or possibly metastatic disease. Electronically Signed   By: Rosalene Colon M.D.   On: 12/25/2023 13:53   CT ABDOMEN PELVIS W CONTRAST Result Date: 12/25/2023 CLINICAL DATA:  Sepsis. * Tracking Code: BO * EXAM: CT ABDOMEN AND PELVIS WITH CONTRAST TECHNIQUE: Multidetector CT imaging of the abdomen and pelvis was performed using the standard protocol following bolus administration of intravenous contrast. RADIATION  DOSE REDUCTION: This exam was performed according to the departmental dose-optimization program which includes automated exposure control, adjustment of the mA and/or kV according to patient size and/or use of iterative reconstruction technique. CONTRAST:  OMNIPAQUE  IOHEXOL  300 MG/ML  SOLN COMPARISON:  CT scan abdomen and pelvis from 11/05/2023. FINDINGS: Lower chest: Since the prior study, there is a new thick-walled cavitary lesion in the left lung lower lobe measuring 2.4 x 2.8 cm. Visualized bilateral lung bases are otherwise clear. No pleural effusion. Normal heart size. No pericardial effusion. Hepatobiliary: The liver is normal in size. Non-cirrhotic configuration. Redemonstration of multiple (more than 25), hypoattenuating liver lesions with dominant lesion in the right hepatic lobe measuring up to 2.2 x 2.3 cm, which previously measured up to 1.5 x 1.8 cm. Findings are compatible with worsening metastases. No intrahepatic or extrahepatic bile duct dilation. Gallbladder is surgically absent. Pancreas: Redemonstration of ill-defined heterogeneous predominantly hypoattenuating mass centered within the pancreatic tail and distal body measuring approximately 4.2 x 7.4 cm, grossly unchanged since the prior study and highly concerning for pancreatic neoplasm. Rest of the pancreas is unremarkable. Main pancreatic duct is not dilated. No peripancreatic fat stranding. 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 Spleen: Normal size spleen. Redemonstration of a hypoattenuating 2.2 x 2.6 cm lesion along the superolateral aspect, which has increased in size since the prior study. There is a probable new sub 5 mm subcapsular focus in the posterosuperior aspect, which is concerning for new metastases. Adrenals/Urinary Tract: Adrenal glands are unremarkable. No suspicious renal mass. No hydronephrosis. No renal or ureteric calculi. Unremarkable urinary  bladder. Stomach/Bowel: No disproportionate dilation of the small or large bowel loops. No evidence of abnormal bowel wall thickening or inflammatory changes. The appendix is unremarkable. There are multiple diverticula mainly in the sigmoid colon, without imaging signs of diverticulitis. Vascular/Lymphatic: No ascites or pneumoperitoneum. No abdominal or pelvic lymphadenopathy, by size criteria. No aneurysmal dilation of the major  abdominal arteries. There are mild peripheral atherosclerotic vascular calcifications of the aorta and its major branches. Reproductive: The uterus is surgically absent. No large adnexal mass. Other: Note is again made of thinned/lax anterior abdominal wall. There is a small fat containing right paramedian periumbilical hernia. The soft tissues and abdominal wall are otherwise unremarkable. Musculoskeletal: No suspicious osseous lesions. There are moderate multilevel degenerative changes in the visualized spine. Bilateral marked hip joint arthritis noted IMPRESSION: 1. Redemonstration of ill-defined heterogeneous hypoattenuating mass centered within the pancreatic tail and distal body, highly concerning for pancreatic neoplasm. 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 2. Redemonstration of multiple hypoattenuating liver lesions, which have increased in size since the prior study, compatible with worsening metastases. 3. There is a new thick-walled cavitary lesion in the left lung lower lobe measuring 2.4 x 2.8 cm. This is indeterminate and differential diagnosis includes metastases versus infectious/inflammatory etiology. Consider dedicated chest imaging. 4. Multiple other nonacute observations, as described above. Aortic Atherosclerosis (ICD10-I70.0). Electronically Signed   By: Beula Brunswick M.D.   On: 12/25/2023 11:37   DG Chest Port 1 View Result Date: 12/25/2023 CLINICAL DATA:  Suspected sepsis EXAM: PORTABLE CHEST  1 VIEW COMPARISON:  09/28/2023 FINDINGS: Numerous leads and wires project over the chest. Patient is rotated to the left. Mildly degraded exam due to AP portable technique and patient body habitus. Mild cardiomegaly. No right-sided pleural effusion. Left costophrenic angle is obscured by overlying breast tissue. IMPRESSION: Degraded exam secondary to AP portable technique and patient body habitus. Especially the inferior left hemithorax is poorly evaluated secondary to overlying soft tissues. Given this limitation, cardiomegaly and low lung volumes without acute disease. If there is a clinical concern of left lower lobe pneumonia, recommend PA and lateral radiographs. Electronically Signed   By: Lore Rode M.D.   On: 12/25/2023 10:51      ECG & Cardiac Imaging    12/25/2023 @ 0920 - SVT vs aflutter, 140, LAD, LAFB, RBBB - personally reviewed.  12/26/2023 @ 0254 - SVT vs aflutter, 144, LAD, LAFB, RBBB - personally reviewed  Tele - SVT vs Aflutter - 142-144  Following adenosine 6 mg, flutter waves noted followed by atrial fibrillation.  Assessment & Plan    1.  Aflutter and atrial fibrillation w/ RVR:  Pt admitted w/ a 2 wk h/o progressive diarrhea, anorexia, and malaise.  Found to be tachycardic @ Onc visit 5/9, prompting admission.  WCT in setting of known RBBB, more or less locked in between 142-144.  Currently asymptomatic.  We gave adenosine 6mg  IV x 1 this AM w/ slowing to low 100's and evidence of flutter waves, followed by resumption of tachycardia though at lower rates, now with evidence of atrial fibrillation.  We will add IV digoxin and low-dose ? blocker as tolerated.  Adding heparin  (CHA2DS2VASc = 4).  If she is unable to be adequately rate controlled/doesn't convert, we will need to consider TEE/DCCV on Monday.  F/u echo and TSH.  Reconsider chest CTA to rule out PE.  2.  Sepsis/gastroenteritis/cavitary lung lesion: Pressure soft but stable.  Follow with addition of low-dose beta-blocker in  the setting of #1.  May require midodrine.  Antibiotics and IV fluids per medicine team.  3.  AKI: In setting of poor p.o. intake.  Creatinine already improved with IV fluids.  Follow.  4.  Lower extremity edema: Patient notes that this is new.  Albumin  only 2.8.  Ongoing tachycardia  may be contributing to diastolic dysfunction and potentially systolic dysfunction.  Echo pending.  Agree with holding off on diuretics at this time in the setting of hypotension and fluid resuscitation requirements.  5.  Type 2 diabetes mellitus: Poorly controlled with an A1c of 10.9.  Management per medicine team.  6.  Metastatic pancreatic cancer: Has been managed as outpatient with weekly chemotherapy, held yesterday due to tachycardia.  Risk Assessment/Risk Scores:          CHA2DS2-VASc Score = 4   This indicates a 4.8% annual risk of stroke. The patient's score is based upon: CHF History: 0 HTN History: 1 Diabetes History: 1 Stroke History: 0 Vascular Disease History: 0 Age Score: 1 Gender Score: 1     Signed, Laneta Pintos, NP 12/26/2023, 11:50 AM  For questions or updates, please contact   Please consult [email or web address] for contact info under Cardiology/STEMI.

## 2023-12-26 NOTE — Progress Notes (Addendum)
 1      PROGRESS NOTE    Kristina Cruz  ZOX:[id number] DOB: August 15, 1961 DOA: 12/25/2023 PCP: Chucky Craver, FNP    Brief Narrative:   63 y.o. female with medical history significant of Stage V pancreatic cancer with liver metastasis on chemotherapy, uncontrolled type 2 diabetes, hypertension, COPD and asthma, morbid obesity, who presents to the ED due to weakness   5/10: Cardiology consult for rapid atrial flutter, palliative care consult for goals of care   Assessment & Plan:   Principal Problem:   Severe sepsis (HCC) Active Problems:   Gastroenteritis   Cavitary lesion of lung   Acute heart failure (HCC)   AKI (acute kidney injury) (HCC)   Pancreatic cancer metastasized to liver (HCC)   Type 2 diabetes mellitus (HCC)   Chronic obstructive pulmonary disease (COPD) (HCC)   Typical atrial flutter (HCC)   Atrial fibrillation with RVR (HCC)   Morbid obesity (HCC)   Pressure injury of skin  * Severe sepsis (HCC) Patient is presenting with severe tachycardia, hypotension, leukocytosis concern for sepsis, with suspected sources being either GI versus pulmonary.  She has been experiencing diarrhea/vomiting for 1 week and a new productive cough for 1 day.   - Telemetry monitoring - S/p 2.1 L bolus.  Continue maintenance fluids - Blood cultures negative thus far - Continue cefepime, vancomycin , Flagyl - Normal lactic acid (2.0-> 1.8)   Atrial flutter with RVR Cardiology consultation.  Discussed with Dr. Jerelene Monday - Echocardiogram pending -Given adenosine-> converted to A-fib -Given digoxin and started metoprolol.  Heart rate improving from 140-> 120s  Lower extremity edema D-dimer is elevated.  lower extremity Dopplers neg for DVT.  Please note patient refused CT angio chest earlier this morning, she couldn't lie flat.   Cavitary lesion of lung During evaluation for sepsis, a new cavitary lesion in the left lower lobe was discovered, measuring 3.5 x 2.6 cm.  Given new cough,  this may be a possible source of her sepsis as she is immuno compromised in the setting of chemotherapy or may be new metastasis of her known stage IV pancreatic cancer.   - Antibiotics as noted above - Expectorated sputum culture, strep pneumo and Legionella urinary antigens - Will obtain pulmonology and ID consultation on Monday if no improvement   Gastroenteritis Patient presenting with 1 week of vomiting and intermittently melanotic diarrhea.  Potentially secondary to recent chemotherapy, however will need to evaluate for infectious etiology as well. No evidence of colitis on imaging   - GI panel ordered but no stool while here - IV fluids as noted above - Zofran  as needed   AKI (acute kidney injury) (HCC) Mild AKI in the setting of hypovolemia and sepsis.   Lab Results  Component Value Date   CREATININE 0.97 12/26/2023   CREATININE 1.18 (H) 12/25/2023   CREATININE 1.33 (H) 12/25/2023    - improved with IV hydration - Hold nephrotoxic agents - Repeat BMP in the a.m.   Pancreatic cancer metastasized to liver [facility]) History of recently diagnosed stage IV pancreatic cancer with metastasis to the liver on chemotherapy.She last received paclitaxel  and gemcitabine  on April 25.  Followed by Dr. Wilhelmenia Harada at the cancer center   Chronic obstructive pulmonary disease (COPD) [facility]) Patient has a history of mixed COPD/asthma with multiple recent exacerbations, however no wheezing on examination at this time and she notes her shortness of breath is at baseline.   -  DuoNebs every 6 hours - Continue home bronchodilators - Pulmonary toilet  Type 2 diabetes mellitus (HCC) Last A1c of 8.7% approximately 4 months ago.  - A1c 10.9 on this admission  Goals of care Consult palliative care   DVT prophylaxis: Heparin  infusion      Code Status: Full code Family Communication: No family at bedside Disposition Plan: Possible discharge in 2 to 3 days depending on clinical condition, cardiology  workup   Consultants:  Cardiology    Antimicrobials:  Cefepime Flagyl Vancomycin    Subjective:  She reports not having much appetite and loss of taste buds.  She is feeling very weak  Objective: Vitals:   12/26/23 0243 12/26/23 0300 12/26/23 0750 12/26/23 1128  BP:   91/66 97/61  Pulse:   (!) 141 (!) 54  Resp: 17 18 16    Temp:   97.9 F (36.6 C) 98.5 F (36.9 C)  TempSrc:      SpO2:   100% 99%  Weight:      Height:        Intake/Output Summary (Last 24 hours) at 12/26/2023 1456 Last data filed at 12/26/2023 0400 Gross per 24 hour  Intake 1922.77 ml  Output --  Net 1922.77 ml   Filed Weights   12/25/23 0922  Weight: 112.6 kg    Examination:  General exam: Appears calm and comfortable  Respiratory system: Clear to auscultation. Respiratory effort normal. Cardiovascular system: S1 & S2 heard, tachycardic, no murmurs, 2+ lower extremity edema Gastrointestinal system: Abdomen is soft, benign Central nervous system: Alert and oriented. No focal neurological deficits. Extremities: Symmetric 5 x 5 power. Skin: No rashes, lesions or ulcers Psychiatry: Judgement and insight appear normal. Mood & affect appropriate.     Data Reviewed: I have personally reviewed following labs and imaging studies  CBC: Recent Labs  Lab 12/25/23 0816 12/25/23 0946 12/26/23 0300  WBC 24.4* 24.6* 25.5*  NEUTROABS 12.4* 15.7* 13.3*  HGB 10.9* 10.7* 10.0*  HCT 32.0* 31.3* 29.6*  MCV 81.2 81.5 81.8  PLT 189 164 167   Basic Metabolic Panel: Recent Labs  Lab 12/25/23 0816 12/25/23 0946 12/26/23 0300  NA 130* 131* 132*  K 3.0* 3.0* 3.9  CL 91* 93* 99  CO2 25 24 25   GLUCOSE 216* 219* 207*  BUN 28* 29* 21  CREATININE 1.33* 1.18* 0.97  CALCIUM  8.0* 8.1* 7.7*  MG  --   --  2.1  PHOS  --   --  2.5   GFR: Estimated Creatinine Clearance: 68.6 mL/min (by C-G formula based on SCr of 0.97 mg/dL). Liver Function Tests: Recent Labs  Lab 12/25/23 0816 12/25/23 0946  12/26/23 0300  AST 25 24 23   ALT 14 15 13   ALKPHOS 120 98 85  BILITOT 1.6* 1.5* 1.0  PROT 6.9 7.0 6.0*  ALBUMIN  2.8* 2.7* 2.4*   Recent Labs  Lab 12/25/23 0946  LIPASE 35   No results for input(s): "AMMONIA" in the last 168 hours. Coagulation Profile: Recent Labs  Lab 12/25/23 0945 12/26/23 1043  INR 1.2 1.3*   Cardiac Enzymes: No results for input(s): "CKTOTAL", "CKMB", "CKMBINDEX", "TROPONINI" in the last 168 hours. BNP (last 3 results) No results for input(s): "PROBNP" in the last 8760 hours. HbA1C: Recent Labs    12/25/23 2200  HGBA1C 10.9*   CBG: Recent Labs  Lab 12/25/23 2132 12/26/23 0751 12/26/23 1130  GLUCAP 260* 206* 216*   Lipid Profile: No results for input(s): "CHOL", "HDL", "LDLCALC", "TRIG", "CHOLHDL", "LDLDIRECT" in the last 72 hours. Thyroid Function Tests: No results for input(s): "TSH", "T4TOTAL", "FREET4", "T3FREE", "  THYROIDAB" in the last 72 hours. Anemia Panel: No results for input(s): "VITAMINB12", "FOLATE", "FERRITIN", "TIBC", "IRON", "RETICCTPCT" in the last 72 hours. Sepsis Labs: Recent Labs  Lab 12/25/23 0945 12/25/23 1140 12/25/23 2235  LATICACIDVEN 1.6 2.0* 1.8    Recent Results (from the past 240 hours)  Culture, blood (Routine x 2)     Status: None (Preliminary result)   Collection Time: 12/25/23  9:45 AM   Specimen: BLOOD  Result Value Ref Range Status   Specimen Description BLOOD RIGHT ANTECUBITAL  Final   Special Requests   Final    BOTTLES DRAWN AEROBIC AND ANAEROBIC Blood Culture adequate volume   Culture   Final    NO GROWTH < 24 HOURS Performed at [facility], [address], [place], [place] [address]    Report Status PENDING  Incomplete  Culture, blood (Routine x 2)     Status: None (Preliminary result)   Collection Time: 12/25/23  9:45 AM   Specimen: BLOOD  Result Value Ref Range Status   Specimen Description BLOOD BLOOD LEFT HAND  Final   Special Requests   Final    BOTTLES DRAWN AEROBIC AND  ANAEROBIC Blood Culture results may not be optimal due to an inadequate volume of blood received in culture bottles   Culture   Final    NO GROWTH < 24 HOURS Performed at [facility], [address], [place], [place] [address]    Report Status PENDING  Incomplete  Resp panel by RT-PCR (RSV, Flu A&B, Covid) Anterior Nasal Swab     Status: None   Collection Time: 12/25/23  9:46 AM   Specimen: Anterior Nasal Swab  Result Value Ref Range Status   SARS Coronavirus 2 by RT PCR NEGATIVE NEGATIVE Final    Comment: (NOTE) SARS-CoV-2 target nucleic acids are NOT DETECTED.  The SARS-CoV-2 RNA is generally detectable in upper respiratory specimens during the acute phase of infection. The lowest concentration of SARS-CoV-2 viral copies this assay can detect is 138 copies/mL. A negative result does not preclude SARS-Cov-2 infection and should not be used as the sole basis for treatment or other patient management decisions. A negative result may occur with  improper specimen collection/handling, submission of specimen other than nasopharyngeal swab, presence of viral mutation(s) within the areas targeted by this assay, and inadequate number of viral copies(<138 copies/mL). A negative result must be combined with clinical observations, patient history, and epidemiological information. The expected result is Negative.  Fact Sheet for Patients:  BloggerCourse.com  Fact Sheet for Healthcare Providers:  SeriousBroker.it  This test is no t yet approved or cleared by the [place]  FDA and  has been authorized for detection and/or diagnosis of SARS-CoV-2 by FDA under an Emergency Use Authorization (EUA). This EUA will remain  in effect (meaning this test can be used) for the duration of the COVID-19 declaration under Section 564(b)(1) of the Act, 21 U.S.C.section 360bbb-3(b)(1), unless the authorization is terminated  or revoked sooner.        Influenza A by PCR NEGATIVE NEGATIVE Final   Influenza B by PCR NEGATIVE NEGATIVE Final    Comment: (NOTE) The Xpert Xpress SARS-CoV-2/FLU/RSV plus assay is intended as an aid in the diagnosis of influenza from Nasopharyngeal swab specimens and should not be used as a sole basis for treatment. Nasal washings and aspirates are unacceptable for Xpert Xpress SARS-CoV-2/FLU/RSV testing.  Fact Sheet for Patients: BloggerCourse.com  Fact Sheet for Healthcare Providers: SeriousBroker.it  This test is not yet  approved or cleared by the [place]  FDA and has been authorized for detection and/or diagnosis of SARS-CoV-2 by FDA under an Emergency Use Authorization (EUA). This EUA will remain in effect (meaning this test can be used) for the duration of the COVID-19 declaration under Section 564(b)(1) of the Act, 21 U.S.C. section 360bbb-3(b)(1), unless the authorization is terminated or revoked.     Resp Syncytial Virus by PCR NEGATIVE NEGATIVE Final    Comment: (NOTE) Fact Sheet for Patients: BloggerCourse.com  Fact Sheet for Healthcare Providers: SeriousBroker.it  This test is not yet approved or cleared by the [place]  FDA and has been authorized for detection and/or diagnosis of SARS-CoV-2 by FDA under an Emergency Use Authorization (EUA). This EUA will remain in effect (meaning this test can be used) for the duration of the COVID-19 declaration under Section 564(b)(1) of the Act, 21 U.S.C. section 360bbb-3(b)(1), unless the authorization is terminated or revoked.  Performed at [facility], [address] [place], [place] [address]          Radiology Studies: US  Venous Img Lower Bilateral (DVT) Result Date: 12/25/2023 CLINICAL DATA:  Lower extremity edema for 2 weeks EXAM: Bilateral Lower Extremity Venous Doppler Ultrasound TECHNIQUE: Gray-scale  sonography with compression, as well as color and duplex ultrasound, were performed to evaluate the deep venous system(s) from the level of the common femoral vein through the popliteal and proximal calf veins. COMPARISON:  None available FINDINGS: VENOUS Normal compressibility of the common femoral, superficial femoral, and popliteal veins, as well as the visualized calf veins. Visualized portions of profunda femoral vein and great saphenous vein unremarkable. No filling defects to suggest DVT on grayscale or color Doppler imaging. Doppler waveforms show normal direction of venous flow, normal respiratory plasticity and response to augmentation. OTHER None. Limitations: none IMPRESSION: No lower extremity DVT. Electronically Signed   By: Elester Grim M.D.   On: 12/25/2023 17:29   CT CHEST WO CONTRAST Result Date: 12/25/2023 CLINICAL DATA:  Bilateral lower extremity edema. History of pancreatic cancer. EXAM: CT CHEST WITHOUT CONTRAST TECHNIQUE: Multidetector CT imaging of the chest was performed following the standard protocol without IV contrast. RADIATION DOSE REDUCTION: This exam was performed according to the departmental dose-optimization program which includes automated exposure control, adjustment of the mA and/or kV according to patient size and/or use of iterative reconstruction technique. COMPARISON:  August 21, 2023. FINDINGS: Cardiovascular: Atherosclerosis of thoracic aorta without aneurysm formation. Mild cardiomegaly. No pericardial effusion. Right internal jugular Port-A-Cath is noted. Mediastinum/Nodes: No enlarged mediastinal or axillary lymph nodes. Thyroid gland, trachea, and esophagus demonstrate no significant findings. Lungs/Pleura: No pneumothorax or pleural effusion is noted. Right lung is clear. 3.5 x 2.6 cm cavitating lesion is noted in left lower lobe which may represent cavitary pneumonia or possibly metastatic disease. Upper Abdomen: Interval development of multiple rounded low  densities throughout the left and right hepatic lobes consistent with metastatic disease. Pancreatic mass is again noted consistent with history of pancreatic malignancy. Musculoskeletal: No chest wall mass or suspicious bone lesions identified. IMPRESSION: Interval development of multiple rounded hepatic low densities consistent with metastatic disease. Pancreatic mass is again noted consistent with history of pancreatic malignancy. 3.5 x 2.6 cm cavitating lesion is noted in left lower lobe which may represent cavitary pneumonia or possibly metastatic disease. Electronically Signed   By: Rosalene Colon M.D.   On: 12/25/2023 13:53   CT ABDOMEN PELVIS W CONTRAST Result Date: 12/25/2023 CLINICAL DATA:  Sepsis. * Tracking Code: BO *  EXAM: CT ABDOMEN AND PELVIS WITH CONTRAST TECHNIQUE: Multidetector CT imaging of the abdomen and pelvis was performed using the standard protocol following bolus administration of intravenous contrast. RADIATION DOSE REDUCTION: This exam was performed according to the departmental dose-optimization program which includes automated exposure control, adjustment of the mA and/or kV according to patient size and/or use of iterative reconstruction technique. CONTRAST:  OMNIPAQUE  IOHEXOL  300 MG/ML  SOLN COMPARISON:  CT scan abdomen and pelvis from 11/05/2023. FINDINGS: Lower chest: Since the prior study, there is a new thick-walled cavitary lesion in the left lung lower lobe measuring 2.4 x 2.8 cm. Visualized bilateral lung bases are otherwise clear. No pleural effusion. Normal heart size. No pericardial effusion. Hepatobiliary: The liver is normal in size. Non-cirrhotic configuration. Redemonstration of multiple (more than 25), hypoattenuating liver lesions with dominant lesion in the right hepatic lobe measuring up to 2.2 x 2.3 cm, which previously measured up to 1.5 x 1.8 cm. Findings are compatible with worsening metastases. No intrahepatic or extrahepatic bile duct dilation.  Gallbladder is surgically absent. Pancreas: Redemonstration of ill-defined heterogeneous predominantly hypoattenuating mass centered within the pancreatic tail and distal body measuring approximately 4.2 x 7.4 cm, grossly unchanged since the prior study and highly concerning for pancreatic neoplasm. Rest of the pancreas is unremarkable. Main pancreatic duct is not dilated. No peripancreatic fat stranding. 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 Spleen: Normal size spleen. Redemonstration of a hypoattenuating 2.2 x 2.6 cm lesion along the superolateral aspect, which has increased in size since the prior study. There is a probable new sub 5 mm subcapsular focus in the posterosuperior aspect, which is concerning for new metastases. Adrenals/Urinary Tract: Adrenal glands are unremarkable. No suspicious renal mass. No hydronephrosis. No renal or ureteric calculi. Unremarkable urinary bladder. Stomach/Bowel: No disproportionate dilation of the small or large bowel loops. No evidence of abnormal bowel wall thickening or inflammatory changes. The appendix is unremarkable. There are multiple diverticula mainly in the sigmoid colon, without imaging signs of diverticulitis. Vascular/Lymphatic: No ascites or pneumoperitoneum. No abdominal or pelvic lymphadenopathy, by size criteria. No aneurysmal dilation of the major abdominal arteries. There are mild peripheral atherosclerotic vascular calcifications of the aorta and its major branches. Reproductive: The uterus is surgically absent. No large adnexal mass. Other: Note is again made of thinned/lax anterior abdominal wall. There is a small fat containing right paramedian periumbilical hernia. The soft tissues and abdominal wall are otherwise unremarkable. Musculoskeletal: No suspicious osseous lesions. There are moderate multilevel degenerative changes in the visualized spine. Bilateral marked hip joint  arthritis noted IMPRESSION: 1. Redemonstration of ill-defined heterogeneous hypoattenuating mass centered within the pancreatic tail and distal body, highly concerning for pancreatic neoplasm. 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 2. Redemonstration of multiple hypoattenuating liver lesions, which have increased in size since the prior study, compatible with worsening metastases. 3. There is a new thick-walled cavitary lesion in the left lung lower lobe measuring 2.4 x 2.8 cm. This is indeterminate and differential diagnosis includes metastases versus infectious/inflammatory etiology. Consider dedicated chest imaging. 4. Multiple other nonacute observations, as described above. Aortic Atherosclerosis (ICD10-I70.0). Electronically Signed   By: Beula Brunswick M.D.   On: 12/25/2023 11:37   DG Chest Port 1 View Result Date: 12/25/2023 CLINICAL DATA:  Suspected sepsis EXAM: PORTABLE CHEST 1 VIEW COMPARISON:  09/28/2023 FINDINGS: Numerous leads and wires project over the chest. Patient is  rotated to the left. Mildly degraded exam due to AP portable technique and patient body habitus. Mild cardiomegaly. No right-sided pleural effusion. Left costophrenic angle is obscured by overlying breast tissue. IMPRESSION: Degraded exam secondary to AP portable technique and patient body habitus. Especially the inferior left hemithorax is poorly evaluated secondary to overlying soft tissues. Given this limitation, cardiomegaly and low lung volumes without acute disease. If there is a clinical concern of left lower lobe pneumonia, recommend PA and lateral radiographs. Electronically Signed   By: Lore Rode M.D.   On: 12/25/2023 10:51        Scheduled Meds:  digoxin  0.25 mg Intravenous Once   fluticasone  furoate-vilanterol  1 puff Inhalation Daily   gabapentin   100 mg Oral TID   insulin  aspart  0-20 Units Subcutaneous TID WC   leptospermum manuka honey   1 Application Topical Daily   metoprolol tartrate  12.5 mg Oral Q6H   rOPINIRole   2 mg Oral BID   sodium chloride  flush  3 mL Intravenous Q12H   Continuous Infusions:  ceFEPime (MAXIPIME) IV Stopped (12/26/23 0130)   heparin  1,100 Units/hr (12/26/23 1159)   metronidazole Stopped (12/26/23 0216)   vancomycin        LOS: 1 day    Time spent: 35 minutes    Lafawn Lenoir Mason Sole, MD [facility] Pager [phone]  If 7PM-7AM, please contact night-coverage [email or web address]  12/26/2023, 2:56 PM

## 2023-12-26 NOTE — Progress Notes (Signed)
       CROSS COVER NOTE  NAME: Kristina Cruz MRN: [id number] DOB : [birth year] ATTENDING PHYSICIAN: Brenna Cam, MD    Date of Service   12/26/2023   HPI/Events of Note   Ongoing tachycardia  HPI and labs/diagnositics reviewed - new lung mass suspected to be metastasis  Interventions   Assessment/Plan:    12/26/2023    2:35 AM 12/25/2023    7:57 PM 12/25/2023    5:23 PM  Vitals with BMI  Systolic 100 98 121  Diastolic 67 76 82  Pulse 144 141 140   Initially after arriving to floor, initiated continuous fluids as ordered, No improvement in heart rate, Patient reported hemoptysis new on 5/9.  Latest Reference Range & Units 12/25/23 09:46 12/26/23 03:00  COMPREHENSIVE METABOLIC PANEL WITH GFR  Rpt ! Rpt !  Sodium 135 - 145 mmol/L 131 (L) 132 (L)  Potassium 3.5 - 5.1 mmol/L 3.0 (L) 3.9  Chloride 98 - 111 mmol/L 93 (L) 99  CO2 22 - 32 mmol/L 24 25  Glucose 70 - 99 mg/dL 409 (H) 811 (H)  BUN 8 - 23 mg/dL 29 (H) 21  Creatinine 9.14 - 1.00 mg/dL 7.82 (H) 9.56  Calcium  8.9 - 10.3 mg/dL 8.1 (L) 7.7 (L)  Anion gap 5 - 15  14 8   Phosphorus 2.5 - 4.6 mg/dL  2.5  Magnesium  1.7 - 2.4 mg/dL  2.1  Alkaline Phosphatase 38 - 126 U/L 98 85  Albumin  3.5 - 5.0 g/dL 2.7 (L) 2.4 (L)  Lipase 11 - 51 U/L 35   AST 15 - 41 U/L 24 23  ALT 0 - 44 U/L 15 13  Total Protein 6.5 - 8.1 g/dL 7.0 6.0 (L)  Total Bilirubin 0.0 - 1.2 mg/dL 1.5 (H) 1.0  GFR, Estimated >60 mL/min 51 (L) >60  !: Data is abnormal (L): Data is abnormally low (H): Data is abnormally high Rpt: View report in Results Review for more information  D dimer 3.5 up from 1.65 in january CTA chest PE ordered Discontinue IV fluids        Kip Peon NP [facility] Cross Cover 7pm-7am - check amion for availability Pager [phone]

## 2023-12-26 NOTE — Progress Notes (Signed)
  Echocardiogram 2D Echocardiogram has been performed. Very difficult study, the patient declined Definity  IV ultrasound imaging agent.  Nichola Barges 12/26/2023, 3:31 PM

## 2023-12-27 DIAGNOSIS — I483 Typical atrial flutter: Secondary | ICD-10-CM | POA: Diagnosis not present

## 2023-12-27 DIAGNOSIS — I4891 Unspecified atrial fibrillation: Secondary | ICD-10-CM

## 2023-12-27 DIAGNOSIS — Z7189 Other specified counseling: Secondary | ICD-10-CM

## 2023-12-27 DIAGNOSIS — Z515 Encounter for palliative care: Secondary | ICD-10-CM | POA: Diagnosis not present

## 2023-12-27 DIAGNOSIS — Z789 Other specified health status: Secondary | ICD-10-CM | POA: Diagnosis not present

## 2023-12-27 DIAGNOSIS — I509 Heart failure, unspecified: Secondary | ICD-10-CM | POA: Diagnosis not present

## 2023-12-27 DIAGNOSIS — A419 Sepsis, unspecified organism: Secondary | ICD-10-CM | POA: Diagnosis not present

## 2023-12-27 DIAGNOSIS — N179 Acute kidney failure, unspecified: Secondary | ICD-10-CM | POA: Diagnosis not present

## 2023-12-27 LAB — COMPREHENSIVE METABOLIC PANEL WITH GFR
ALT: 13 U/L (ref 0–44)
AST: 21 U/L (ref 15–41)
Albumin: 2.3 g/dL — ABNORMAL LOW (ref 3.5–5.0)
Alkaline Phosphatase: 83 U/L (ref 38–126)
Anion gap: 8 (ref 5–15)
BUN: 16 mg/dL (ref 8–23)
CO2: 24 mmol/L (ref 22–32)
Calcium: 7.7 mg/dL — ABNORMAL LOW (ref 8.9–10.3)
Chloride: 103 mmol/L (ref 98–111)
Creatinine, Ser: 0.92 mg/dL (ref 0.44–1.00)
GFR, Estimated: >60 mL/min (ref >60)
Glucose, Bld: 134 mg/dL — ABNORMAL HIGH (ref 70–99)
Potassium: 3.8 mmol/L (ref 3.5–5.1)
Sodium: 135 mmol/L (ref 135–145)
Total Bilirubin: 1 mg/dL (ref 0.0–1.2)
Total Protein: 5.9 g/dL — ABNORMAL LOW (ref 6.5–8.1)

## 2023-12-27 LAB — GLUCOSE, CAPILLARY
Glucose-Capillary: 134 mg/dL — ABNORMAL HIGH (ref 70–99)
Glucose-Capillary: 125 mg/dL — ABNORMAL HIGH (ref 70–99)
Glucose-Capillary: 150 mg/dL — ABNORMAL HIGH (ref 70–99)
Glucose-Capillary: 125 mg/dL — ABNORMAL HIGH (ref 70–99)
Glucose-Capillary: 130 mg/dL — ABNORMAL HIGH (ref 70–99)

## 2023-12-27 LAB — HEPARIN LEVEL (UNFRACTIONATED)
Heparin Unfractionated: 0.29 [IU]/mL — ABNORMAL LOW (ref 0.30–0.70)
Heparin Unfractionated: 0.34 [IU]/mL (ref 0.30–0.70)
Heparin Unfractionated: 0.48 [IU]/mL (ref 0.30–0.70)

## 2023-12-27 LAB — CBC
HCT: 28.7 % — ABNORMAL LOW (ref 36.0–46.0)
Hemoglobin: 9.7 g/dL — ABNORMAL LOW (ref 12.0–15.0)
MCH: 28 pg (ref 26.0–34.0)
MCHC: 33.8 g/dL (ref 30.0–36.0)
MCV: 82.9 fL (ref 80.0–100.0)
Platelets: 153 10*3/uL (ref 150–400)
RBC: 3.46 MIL/uL — ABNORMAL LOW (ref 3.87–5.11)
RDW: 15.7 % — ABNORMAL HIGH (ref 11.5–15.5)
WBC: 25.3 10*3/uL — ABNORMAL HIGH (ref 4.0–10.5)
nRBC: 2.5 % — ABNORMAL HIGH (ref 0.0–0.2)

## 2023-12-27 MED ORDER — CHLORHEXIDINE GLUCONATE CLOTH 2 % EX PADS
6.0000 | MEDICATED_PAD | Freq: Every day | CUTANEOUS | Status: DC
  Administered 2023-12-27 – 2023-12-30 (×4): 6 via TOPICAL

## 2023-12-27 MED ORDER — DIGOXIN 125 MCG PO TABS
0.1250 mg | ORAL_TABLET | Freq: Every day | ORAL | Status: DC
  Administered 2023-12-27 – 2023-12-28 (×2): 0.125 mg via ORAL
  Filled 2023-12-27 (×3): qty 1

## 2023-12-27 MED ORDER — SODIUM CHLORIDE 0.9 % IV SOLN
2.0000 g | Freq: Three times a day (TID) | INTRAVENOUS | Status: AC
Start: 2023-12-27 — End: 2023-12-29
  Administered 2023-12-27 – 2023-12-29 (×8): 2 g via INTRAVENOUS
  Filled 2023-12-27 (×8): qty 12.5

## 2023-12-27 MED ORDER — VANCOMYCIN HCL 1.25 G IV SOLR
1250.0000 mg | INTRAVENOUS | Status: DC
  Administered 2023-12-27 – 2023-12-28 (×2): 1250 mg via INTRAVENOUS
  Filled 2023-12-27 (×3): qty 25

## 2023-12-27 NOTE — Consult Note (Signed)
 PHARMACY - ANTICOAGULATION CONSULT NOTE  Pharmacy Consult for Heparin  Indication: Atrial Flutter   Allergies  Allergen Reactions   Peanut-Containing Drug Products    Patient Measurements: Height: 5\' 2"  (157.5 cm) Weight: 112.6 kg (248 lb 3.8 oz) IBW/kg (Calculated) : 50.1 HEPARIN  DW (KG): 77.6  Vital Signs: Temp: 98.9 F (37.2 C) (05/11 0736) Temp Source: Oral (05/11 0736) BP: 95/73 (05/11 0736) Pulse Rate: 98 (05/11 0736)  Labs: Recent Labs    12/25/23 0816 12/25/23 0945 12/25/23 0946 12/25/23 1140 12/26/23 0300 12/26/23 1043 12/26/23 1830 12/27/23 0331 12/27/23 1150  HGB   < >  --  10.7*  --  10.0*  --   --  9.7*  --   HCT  --   --  31.3*  --  29.6*  --   --  28.7*  --   PLT  --   --  164  --  167  --   --  153  --   APTT  --   --   --   --   --  40*  --   --   --   LABPROT  --  15.8*  --   --   --  16.8*  --   --   --   INR  --  1.2  --   --   --  1.3*  --   --   --   HEPARINUNFRC  --   --   --   --   --   --  >1.10* 0.29* 0.34  CREATININE  --   --  1.18*  --  0.97  --   --  0.92  --   TROPONINIHS  --   --  12 14  --   --   --   --   --    < > = values in this interval not displayed.   Estimated Creatinine Clearance: 72.3 mL/min (by C-G formula based on SCr of 0.92 mg/dL).  Medical History: Past Medical History:  Diagnosis Date   Anemia    Asthma    Back pain    Cataract    Diabetes mellitus without complication (HCC)    GERD (gastroesophageal reflux disease)    History of echocardiogram    Hypertension    Morbid obesity with BMI of 60.0-69.9, adult (HCC)    Restless leg syndrome    Medications:  No PTA anticoagulation  DVT prophylactic LMWH given 5/9 @ 2013   Assessment: 63 year old female found to be in atrial flutter with RVR. From chart review, patient was not on any anticoagulation prior to admission. Pharmacy has been consulted for initiation and management of a heparin  infusion. Baseline labs: Hgb 10.0, PLT 167, aPTT & INR have been ordered.    Goal of Therapy:  Heparin  level 0.3-0.7 units/ml Monitor platelets by anticoagulation protocol: Yes  0510 @ 1830: HL >1.10 = Supratherapeutic 05/11 0331 HL 0.29, slightly subtherapeutic 05/11 1150 HL 0.34, therapeutic    Plan: HL therapeutic x 1  Continue heparin  infusion at 1000 units/hr  Recheck heparin  level in 6 hours to confirm rate  CBC daily while on heparin  infusion F/u transition to PO anticoagulation if indicated  Leah Skora, PharmD Pharmacy Resident  12/27/2023 12:19 PM

## 2023-12-27 NOTE — Care Management Important Message (Signed)
 Important Message  Patient Details  Name: Kristina Cruz MRN: [id number] Date of Birth: 1961/04/23   Important Message Given:  Yes - Medicare IM     Anise Kerns 12/27/2023, 2:08 PM

## 2023-12-27 NOTE — Progress Notes (Signed)
 1      PROGRESS NOTE    Kristina Cruz  ZOX:[id number] DOB: [birth year] DOA: 12/25/2023 PCP: Chucky Craver, FNP    Brief Narrative:   63 y.o. female with medical history significant of Stage V pancreatic cancer with liver metastasis on chemotherapy, uncontrolled type 2 diabetes, hypertension, COPD and asthma, morbid obesity, who presents to the ED due to weakness   5/10: Cardiology consult for rapid atrial flutter, palliative care consult for goals of care 5/11: PT and OT consultation, metoprolol for rate control, may need TEE/cardioversion   Assessment & Plan:   Principal Problem:   Severe sepsis (HCC) Active Problems:   Gastroenteritis   Cavitary lesion of lung   Acute heart failure (HCC)   AKI (acute kidney injury) (HCC)   Pancreatic cancer metastasized to liver (HCC)   Type 2 diabetes mellitus (HCC)   Chronic obstructive pulmonary disease (COPD) (HCC)   Typical atrial flutter (HCC)   Atrial fibrillation with RVR (HCC)   Morbid obesity (HCC)   Pressure injury of skin  * Severe sepsis (HCC) Patient is presenting with severe tachycardia, hypotension, leukocytosis concern for sepsis, with suspected sources being either GI versus pulmonary.  She has been experiencing diarrhea/vomiting for 1 week and a new productive cough for 1 day. -No stool/BM while here.  No exact source of infection found yet - Telemetry monitoring - S/p 2.1 L bolus.  Continue maintenance fluids - Blood cultures negative thus far - Continue cefepime, vancomycin , Flagyl - Normal lactic acid (2.0-> 1.8)   Atrial flutter with RVR Cardiology consultation.  Discussed with Dr. Jerelene Monday - Echocardiogram shows EF of 55 to 60% - Status post adenosine on 5/10-> converted to A-fib - Continue digoxin and metoprolol for rate control.  Heart rate improving from 140-> 120s - May need TEE/cardioversion if not converted to sinus rhythm  Lower extremity edema D-dimer is elevated.  lower extremity Dopplers neg for  DVT.  Please note patient refused CT angio chest but, she couldn't lie flat.   Cavitary lesion of lung During evaluation for sepsis, a new cavitary lesion in the left lower lobe was discovered, measuring 3.5 x 2.6 cm.  Given new cough, this may be a possible source of her sepsis as she is immuno compromised in the setting of chemotherapy or may be new metastasis of her known stage IV pancreatic cancer.   - Antibiotics as noted above - Expectorated sputum culture, strep pneumo and Legionella urinary antigens - Will obtain pulmonology and ID consultation on Monday if no improvement   Gastroenteritis Patient presenting with 1 week of vomiting and intermittently melanotic diarrhea.  Potentially secondary to recent chemotherapy, however will need to evaluate for infectious etiology as well. No evidence of colitis on imaging   - GI panel ordered but no stool while here - IV fluids as noted above - Zofran  as needed   AKI (acute kidney injury) (HCC) Mild AKI in the setting of hypovolemia and sepsis.   Lab Results  Component Value Date   CREATININE 0.92 12/27/2023   CREATININE 0.97 12/26/2023   CREATININE 1.18 (H) 12/25/2023    - improved with IV hydration - Hold nephrotoxic agents - Repeat BMP in the a.m.   Pancreatic cancer metastasized to liver [facility]) History of recently diagnosed stage IV pancreatic cancer with metastasis to the liver on chemotherapy.She last received paclitaxel  and gemcitabine  on April 25.  Followed by Dr. Wilhelmenia Harada at the cancer center Palliative care consult   Chronic obstructive pulmonary disease (COPD) (HCC)  Patient has a history of mixed COPD/asthma with multiple recent exacerbations, however no wheezing on examination at this time and she notes her shortness of breath is at baseline.   -  DuoNebs every 6 hours - Continue home bronchodilators - Pulmonary toilet   Type 2 diabetes mellitus (HCC) Last A1c of 8.7% approximately 4 months ago.  - A1c 10.9 on this  admission  Goals of care Consult palliative care   DVT prophylaxis: Heparin  infusion      Code Status: Full code Family Communication: No family at bedside Disposition Plan: Possible discharge in 2 to 3 days depending on clinical condition, cardiology workup   Consultants:  Cardiology    Antimicrobials:  Cefepime Flagyl Vancomycin    Subjective:  She is feeling somewhat better.  Still having no appetite and loss of taste.  Heart rate somewhat better controlled  Objective: Vitals:   12/26/23 2250 12/26/23 2323 12/27/23 0439 12/27/23 0736  BP: 96/78 113/74 118/65 95/73  Pulse: 90 100 (!) 111 98  Resp:  18  16  Temp:  98.4 F (36.9 C) 98.2 F (36.8 C) 98.9 F (37.2 C)  TempSrc:  Oral Oral Oral  SpO2:  95% 96% 99%  Weight:      Height:        Intake/Output Summary (Last 24 hours) at 12/27/2023 1105 Last data filed at 12/27/2023 0600 Gross per 24 hour  Intake 952.74 ml  Output --  Net 952.74 ml   Filed Weights   12/25/23 0922  Weight: 112.6 kg    Examination:  General exam: Appears calm and comfortable  Respiratory system: Clear to auscultation. Respiratory effort normal. Cardiovascular system: S1 & S2 heard, tachycardic, no murmurs, 1+ lower extremity edema Gastrointestinal system: Abdomen is soft, benign Central nervous system: Alert and oriented. No focal neurological deficits. Extremities: Symmetric 5 x 5 power. Skin: No rashes, lesions or ulcers Psychiatry: Judgement and insight appear normal. Mood & affect appropriate.     Data Reviewed: I have personally reviewed following labs and imaging studies  CBC: Recent Labs  Lab 12/25/23 0816 12/25/23 0946 12/26/23 0300 12/27/23 0331  WBC 24.4* 24.6* 25.5* 25.3*  NEUTROABS 12.4* 15.7* 13.3*  --   HGB 10.9* 10.7* 10.0* 9.7*  HCT 32.0* 31.3* 29.6* 28.7*  MCV 81.2 81.5 81.8 82.9  PLT 189 164 167 153   Basic Metabolic Panel: Recent Labs  Lab 12/25/23 0816 12/25/23 0946 12/26/23 0300  12/27/23 0331  NA 130* 131* 132* 135  K 3.0* 3.0* 3.9 3.8  CL 91* 93* 99 103  CO2 25 24 25 24   GLUCOSE 216* 219* 207* 134*  BUN 28* 29* 21 16  CREATININE 1.33* 1.18* 0.97 0.92  CALCIUM  8.0* 8.1* 7.7* 7.7*  MG  --   --  2.1  --   PHOS  --   --  2.5  --    GFR: Estimated Creatinine Clearance: 72.3 mL/min (by C-G formula based on SCr of 0.92 mg/dL). Liver Function Tests: Recent Labs  Lab 12/25/23 0816 12/25/23 0946 12/26/23 0300 12/27/23 0331  AST 25 24 23 21   ALT 14 15 13 13   ALKPHOS 120 98 85 83  BILITOT 1.6* 1.5* 1.0 1.0  PROT 6.9 7.0 6.0* 5.9*  ALBUMIN  2.8* 2.7* 2.4* 2.3*   Recent Labs  Lab 12/25/23 0946  LIPASE 35   No results for input(s): "AMMONIA" in the last 168 hours. Coagulation Profile: Recent Labs  Lab 12/25/23 0945 12/26/23 1043  INR 1.2 1.3*   Cardiac Enzymes: No  results for input(s): "CKTOTAL", "CKMB", "CKMBINDEX", "TROPONINI" in the last 168 hours. BNP (last 3 results) No results for input(s): "PROBNP" in the last 8760 hours. HbA1C: Recent Labs    12/25/23 2200  HGBA1C 10.9*   CBG: Recent Labs  Lab 12/26/23 1640 12/26/23 1943 12/26/23 2340 12/27/23 0343 12/27/23 0738  GLUCAP 134* 150* 149* 134* 125*   Lipid Profile: No results for input(s): "CHOL", "HDL", "LDLCALC", "TRIG", "CHOLHDL", "LDLDIRECT" in the last 72 hours. Thyroid Function Tests: No results for input(s): "TSH", "T4TOTAL", "FREET4", "T3FREE", "THYROIDAB" in the last 72 hours. Anemia Panel: No results for input(s): "VITAMINB12", "FOLATE", "FERRITIN", "TIBC", "IRON", "RETICCTPCT" in the last 72 hours. Sepsis Labs: Recent Labs  Lab 12/25/23 0945 12/25/23 1140 12/25/23 2235  LATICACIDVEN 1.6 2.0* 1.8    Recent Results (from the past 240 hours)  Culture, blood (Routine x 2)     Status: None (Preliminary result)   Collection Time: 12/25/23  9:45 AM   Specimen: BLOOD  Result Value Ref Range Status   Specimen Description BLOOD RIGHT ANTECUBITAL  Final   Special  Requests   Final    BOTTLES DRAWN AEROBIC AND ANAEROBIC Blood Culture adequate volume   Culture   Final    NO GROWTH 2 DAYS Performed at [facility], [address] [place], [place] [address]    Report Status PENDING  Incomplete  Culture, blood (Routine x 2)     Status: None (Preliminary result)   Collection Time: 12/25/23  9:45 AM   Specimen: BLOOD  Result Value Ref Range Status   Specimen Description BLOOD BLOOD LEFT HAND  Final   Special Requests   Final    BOTTLES DRAWN AEROBIC AND ANAEROBIC Blood Culture results may not be optimal due to an inadequate volume of blood received in culture bottles   Culture   Final    NO GROWTH 2 DAYS Performed at [facility], [address], [place], [place] [address]    Report Status PENDING  Incomplete  Resp panel by RT-PCR (RSV, Flu A&B, Covid) Anterior Nasal Swab     Status: None   Collection Time: 12/25/23  9:46 AM   Specimen: Anterior Nasal Swab  Result Value Ref Range Status   SARS Coronavirus 2 by RT PCR NEGATIVE NEGATIVE Final    Comment: (NOTE) SARS-CoV-2 target nucleic acids are NOT DETECTED.  The SARS-CoV-2 RNA is generally detectable in upper respiratory specimens during the acute phase of infection. The lowest concentration of SARS-CoV-2 viral copies this assay can detect is 138 copies/mL. A negative result does not preclude SARS-Cov-2 infection and should not be used as the sole basis for treatment or other patient management decisions. A negative result may occur with  improper specimen collection/handling, submission of specimen other than nasopharyngeal swab, presence of viral mutation(s) within the areas targeted by this assay, and inadequate number of viral copies(<138 copies/mL). A negative result must be combined with clinical observations, patient history, and epidemiological information. The expected result is Negative.  Fact Sheet for Patients:   BloggerCourse.com  Fact Sheet for Healthcare Providers:  SeriousBroker.it  This test is no t yet approved or cleared by the [place]  FDA and  has been authorized for detection and/or diagnosis of SARS-CoV-2 by FDA under an Emergency Use Authorization (EUA). This EUA will remain  in effect (meaning this test can be used) for the duration of the COVID-19 declaration under Section 564(b)(1) of the Act, 21 U.S.C.section 360bbb-3(b)(1), unless the authorization is terminated  or revoked  sooner.       Influenza A by PCR NEGATIVE NEGATIVE Final   Influenza B by PCR NEGATIVE NEGATIVE Final    Comment: (NOTE) The Xpert Xpress SARS-CoV-2/FLU/RSV plus assay is intended as an aid in the diagnosis of influenza from Nasopharyngeal swab specimens and should not be used as a sole basis for treatment. Nasal washings and aspirates are unacceptable for Xpert Xpress SARS-CoV-2/FLU/RSV testing.  Fact Sheet for Patients: BloggerCourse.com  Fact Sheet for Healthcare Providers: SeriousBroker.it  This test is not yet approved or cleared by the [place]  FDA and has been authorized for detection and/or diagnosis of SARS-CoV-2 by FDA under an Emergency Use Authorization (EUA). This EUA will remain in effect (meaning this test can be used) for the duration of the COVID-19 declaration under Section 564(b)(1) of the Act, 21 U.S.C. section 360bbb-3(b)(1), unless the authorization is terminated or revoked.     Resp Syncytial Virus by PCR NEGATIVE NEGATIVE Final    Comment: (NOTE) Fact Sheet for Patients: BloggerCourse.com  Fact Sheet for Healthcare Providers: SeriousBroker.it  This test is not yet approved or cleared by the [place]  FDA and has been authorized for detection and/or diagnosis of SARS-CoV-2 by FDA under an Emergency Use  Authorization (EUA). This EUA will remain in effect (meaning this test can be used) for the duration of the COVID-19 declaration under Section 564(b)(1) of the Act, 21 U.S.C. section 360bbb-3(b)(1), unless the authorization is terminated or revoked.  Performed at [facility], [address] [place], [place] [address]          Radiology Studies: ECHOCARDIOGRAM COMPLETE Result Date: 12/26/2023    ECHOCARDIOGRAM REPORT   Patient Name:   Ronnesha Faller Date of Exam: 12/26/2023 Medical Rec #:  [id number]          Height:       62.0 in Accession #:    [id number]         Weight:       248.2 lb Date of Birth:  Aug 28, 1960          BSA:          2.096 m Patient Age:    63 years           BP:           91/66 mmHg Patient Gender: F                  HR:           111 bpm. Exam Location:  [facility] Procedure: 2D Echo (Both Spectral and Color Flow Doppler were utilized during            procedure). Indications:     CHF I50.31  History:         Patient has prior history of Echocardiogram examinations, most                  recent 02/12/2023.  Sonographer:     Clenton Czech RDCS, FASE Referring Phys:  [id number] Avi Body Diagnosing Phys: Belva Boyden MD  Sonographer Comments: Technically challenging study due to limited acoustic windows, suboptimal apical window and patient is obese. Image acquisition challenging due to patient body habitus and Image acquisition challenging due to uncooperative patient. The patient declined Definity  IV ultrasound imaging agent. IMPRESSIONS  1. Left ventricular ejection fraction, by estimation, is 55 to 60%. Left ventricular ejection fraction by PLAX is 51 %. The left ventricle has normal function. The left ventricle has no regional wall  motion abnormalities. There is moderate left ventricular hypertrophy. Left ventricular diastolic parameters are indeterminate.  2. Right ventricular systolic function is mildly reduced. The right ventricular size is normal. Tricuspid  regurgitation signal is inadequate for assessing PA pressure.  3. The mitral valve is normal in structure. No evidence of mitral valve regurgitation. No evidence of mitral stenosis.  4. The aortic valve is normal in structure. Aortic valve regurgitation is not visualized. No aortic stenosis is present.  5. The inferior vena cava is normal in size with greater than 50% respiratory variability, suggesting right atrial pressure of 3 mmHg. FINDINGS  Left Ventricle: Left ventricular ejection fraction, by estimation, is 55 to 60%. Left ventricular ejection fraction by PLAX is 51 %. The left ventricle has normal function. The left ventricle has no regional wall motion abnormalities. Strain was performed and the global longitudinal strain is indeterminate. The left ventricular internal cavity size was normal in size. There is moderate left ventricular hypertrophy. Left ventricular diastolic parameters are indeterminate. Right Ventricle: The right ventricular size is normal. No increase in right ventricular wall thickness. Right ventricular systolic function is mildly reduced. Tricuspid regurgitation signal is inadequate for assessing PA pressure. Left Atrium: Left atrial size was normal in size. Right Atrium: Right atrial size was normal in size. Pericardium: There is no evidence of pericardial effusion. Mitral Valve: The mitral valve is normal in structure. No evidence of mitral valve regurgitation. No evidence of mitral valve stenosis. Tricuspid Valve: The tricuspid valve is normal in structure. Tricuspid valve regurgitation is not demonstrated. No evidence of tricuspid stenosis. Aortic Valve: The aortic valve is normal in structure. Aortic valve regurgitation is not visualized. No aortic stenosis is present. Pulmonic Valve: The pulmonic valve was normal in structure. Pulmonic valve regurgitation is not visualized. No evidence of pulmonic stenosis. Aorta: The aortic root is normal in size and structure. Venous: The  inferior vena cava is normal in size with greater than 50% respiratory variability, suggesting right atrial pressure of 3 mmHg. IAS/Shunts: No atrial level shunt detected by color flow Doppler. Additional Comments: 3D was performed not requiring image post processing on an independent workstation and was indeterminate.  LEFT VENTRICLE PLAX 2D LV EF:         Left ventricular ejection fraction by PLAX is 51 %. LVIDd:         4.60 cm LVIDs:         3.40 cm LV PW:         1.40 cm LV IVS:        1.40 cm LVOT diam:     1.90 cm LVOT Area:     2.84 cm  LEFT ATRIUM         Index LA diam:    3.30 cm 1.57 cm/m                        PULMONIC VALVE AORTA                 PV Vmax:       0.98 m/s Ao Root diam: 3.90 cm PV Peak grad:  3.8 mmHg Ao Asc diam:  3.00 cm  MITRAL VALVE               TRICUSPID VALVE MV Area (PHT): 4.46 cm    TR Peak grad:   13.0 mmHg MV Decel Time: 170 msec    TR Vmax:        180.00 cm/s MV E  velocity: 98.50 cm/s MV A velocity: 68.70 cm/s  SHUNTS MV E/A ratio:  1.43        Systemic Diam: 1.90 cm Belva Boyden MD Electronically signed by Belva Boyden MD Signature Date/Time: 12/26/2023/4:06:59 PM    Final    US  Venous Img Lower Bilateral (DVT) Result Date: 12/25/2023 CLINICAL DATA:  Lower extremity edema for 2 weeks EXAM: Bilateral Lower Extremity Venous Doppler Ultrasound TECHNIQUE: Gray-scale sonography with compression, as well as color and duplex ultrasound, were performed to evaluate the deep venous system(s) from the level of the common femoral vein through the popliteal and proximal calf veins. COMPARISON:  None available FINDINGS: VENOUS Normal compressibility of the common femoral, superficial femoral, and popliteal veins, as well as the visualized calf veins. Visualized portions of profunda femoral vein and great saphenous vein unremarkable. No filling defects to suggest DVT on grayscale or color Doppler imaging. Doppler waveforms show normal direction of venous flow, normal respiratory  plasticity and response to augmentation. OTHER None. Limitations: none IMPRESSION: No lower extremity DVT. Electronically Signed   By: Elester Grim M.D.   On: 12/25/2023 17:29   CT CHEST WO CONTRAST Result Date: 12/25/2023 CLINICAL DATA:  Bilateral lower extremity edema. History of pancreatic cancer. EXAM: CT CHEST WITHOUT CONTRAST TECHNIQUE: Multidetector CT imaging of the chest was performed following the standard protocol without IV contrast. RADIATION DOSE REDUCTION: This exam was performed according to the departmental dose-optimization program which includes automated exposure control, adjustment of the mA and/or kV according to patient size and/or use of iterative reconstruction technique. COMPARISON:  August 21, 2023. FINDINGS: Cardiovascular: Atherosclerosis of thoracic aorta without aneurysm formation. Mild cardiomegaly. No pericardial effusion. Right internal jugular Port-A-Cath is noted. Mediastinum/Nodes: No enlarged mediastinal or axillary lymph nodes. Thyroid gland, trachea, and esophagus demonstrate no significant findings. Lungs/Pleura: No pneumothorax or pleural effusion is noted. Right lung is clear. 3.5 x 2.6 cm cavitating lesion is noted in left lower lobe which may represent cavitary pneumonia or possibly metastatic disease. Upper Abdomen: Interval development of multiple rounded low densities throughout the left and right hepatic lobes consistent with metastatic disease. Pancreatic mass is again noted consistent with history of pancreatic malignancy. Musculoskeletal: No chest wall mass or suspicious bone lesions identified. IMPRESSION: Interval development of multiple rounded hepatic low densities consistent with metastatic disease. Pancreatic mass is again noted consistent with history of pancreatic malignancy. 3.5 x 2.6 cm cavitating lesion is noted in left lower lobe which may represent cavitary pneumonia or possibly metastatic disease. Electronically Signed   By: Rosalene Colon M.D.    On: 12/25/2023 13:53   CT ABDOMEN PELVIS W CONTRAST Result Date: 12/25/2023 CLINICAL DATA:  Sepsis. * Tracking Code: BO * EXAM: CT ABDOMEN AND PELVIS WITH CONTRAST TECHNIQUE: Multidetector CT imaging of the abdomen and pelvis was performed using the standard protocol following bolus administration of intravenous contrast. RADIATION DOSE REDUCTION: This exam was performed according to the departmental dose-optimization program which includes automated exposure control, adjustment of the mA and/or kV according to patient size and/or use of iterative reconstruction technique. CONTRAST:  OMNIPAQUE  IOHEXOL  300 MG/ML  SOLN COMPARISON:  CT scan abdomen and pelvis from 11/05/2023. FINDINGS: Lower chest: Since the prior study, there is a new thick-walled cavitary lesion in the left lung lower lobe measuring 2.4 x 2.8 cm. Visualized bilateral lung bases are otherwise clear. No pleural effusion. Normal heart size. No pericardial effusion. Hepatobiliary: The liver is normal in size. Non-cirrhotic configuration. Redemonstration of multiple (more than 25), hypoattenuating  liver lesions with dominant lesion in the right hepatic lobe measuring up to 2.2 x 2.3 cm, which previously measured up to 1.5 x 1.8 cm. Findings are compatible with worsening metastases. No intrahepatic or extrahepatic bile duct dilation. Gallbladder is surgically absent. Pancreas: Redemonstration of ill-defined heterogeneous predominantly hypoattenuating mass centered within the pancreatic tail and distal body measuring approximately 4.2 x 7.4 cm, grossly unchanged since the prior study and highly concerning for pancreatic neoplasm. Rest of the pancreas is unremarkable. Main pancreatic duct is not dilated. No peripancreatic fat stranding. 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 Spleen: Normal size spleen. Redemonstration of a hypoattenuating 2.2 x 2.6 cm lesion along the superolateral  aspect, which has increased in size since the prior study. There is a probable new sub 5 mm subcapsular focus in the posterosuperior aspect, which is concerning for new metastases. Adrenals/Urinary Tract: Adrenal glands are unremarkable. No suspicious renal mass. No hydronephrosis. No renal or ureteric calculi. Unremarkable urinary bladder. Stomach/Bowel: No disproportionate dilation of the small or large bowel loops. No evidence of abnormal bowel wall thickening or inflammatory changes. The appendix is unremarkable. There are multiple diverticula mainly in the sigmoid colon, without imaging signs of diverticulitis. Vascular/Lymphatic: No ascites or pneumoperitoneum. No abdominal or pelvic lymphadenopathy, by size criteria. No aneurysmal dilation of the major abdominal arteries. There are mild peripheral atherosclerotic vascular calcifications of the aorta and its major branches. Reproductive: The uterus is surgically absent. No large adnexal mass. Other: Note is again made of thinned/lax anterior abdominal wall. There is a small fat containing right paramedian periumbilical hernia. The soft tissues and abdominal wall are otherwise unremarkable. Musculoskeletal: No suspicious osseous lesions. There are moderate multilevel degenerative changes in the visualized spine. Bilateral marked hip joint arthritis noted IMPRESSION: 1. Redemonstration of ill-defined heterogeneous hypoattenuating mass centered within the pancreatic tail and distal body, highly concerning for pancreatic neoplasm. 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 2. Redemonstration of multiple hypoattenuating liver lesions, which have increased in size since the prior study, compatible with worsening metastases. 3. There is a new thick-walled cavitary lesion in the left lung lower lobe measuring 2.4 x 2.8 cm. This is indeterminate and differential diagnosis includes metastases versus  infectious/inflammatory etiology. Consider dedicated chest imaging. 4. Multiple other nonacute observations, as described above. Aortic Atherosclerosis (ICD10-I70.0). Electronically Signed   By: Beula Brunswick M.D.   On: 12/25/2023 11:37        Scheduled Meds:  Chlorhexidine  Gluconate Cloth  6 each Topical Q0600   fluticasone  furoate-vilanterol  1 puff Inhalation Daily   gabapentin   100 mg Oral TID   insulin  aspart  0-20 Units Subcutaneous TID WC   leptospermum manuka honey  1 Application Topical Daily   metoprolol tartrate  12.5 mg Oral Q6H   rOPINIRole   2 mg Oral BID   sodium chloride  flush  3 mL Intravenous Q12H   Continuous Infusions:  ceFEPime (MAXIPIME) IV 2 g (12/27/23 0945)   heparin  1,000 Units/hr (12/27/23 0534)   metronidazole 500 mg (12/27/23 1031)   vancomycin        LOS: 2 days    Time spent: 35 minutes    Neala Miggins Mason Sole, MD [facility] Pager [phone]  If 7PM-7AM, please contact night-coverage [email or web address]  12/27/2023, 11:05 AM

## 2023-12-27 NOTE — Progress Notes (Signed)
 PT Cancellation Note  Patient Details Name: Kristina Cruz MRN: [id number] DOB: May 05, 1961   Cancelled Treatment:     PT evaluation not completed 2/2 to pt HR 144/min at rest. PT communicated with the MD about it and MD advised to hold th ept for today. PT will attempt again tomorrow if pt is appropriate and OK with MD.    Henderson Lock 12/27/2023, 10:04 AM

## 2023-12-27 NOTE — Consult Note (Signed)
 PHARMACY - ANTICOAGULATION CONSULT NOTE  Pharmacy Consult for Heparin  Indication: Atrial Flutter   Allergies  Allergen Reactions   Peanut-Containing Drug Products    Patient Measurements: Height: 5\' 2"  (157.5 cm) Weight: 112.6 kg (248 lb 3.8 oz) IBW/kg (Calculated) : 50.1 HEPARIN  DW (KG): 77.6  Vital Signs: Temp: 97.9 F (36.6 C) (05/11 1706) Temp Source: Oral (05/11 0736) BP: 95/64 (05/11 1706) Pulse Rate: 115 (05/11 1706)  Labs: Recent Labs    12/25/23 0816 12/25/23 0945 12/25/23 0946 12/25/23 1140 12/26/23 0300 12/26/23 1043 12/26/23 1830 12/27/23 0331 12/27/23 1150 12/27/23 1747  HGB   < >  --  10.7*  --  10.0*  --   --  9.7*  --   --   HCT  --   --  31.3*  --  29.6*  --   --  28.7*  --   --   PLT  --   --  164  --  167  --   --  153  --   --   APTT  --   --   --   --   --  40*  --   --   --   --   LABPROT  --  15.8*  --   --   --  16.8*  --   --   --   --   INR  --  1.2  --   --   --  1.3*  --   --   --   --   HEPARINUNFRC  --   --   --   --   --   --    < > 0.29* 0.34 0.48  CREATININE  --   --  1.18*  --  0.97  --   --  0.92  --   --   TROPONINIHS  --   --  12 14  --   --   --   --   --   --    < > = values in this interval not displayed.   Estimated Creatinine Clearance: 72.3 mL/min (by C-G formula based on SCr of 0.92 mg/dL).  Medical History: Past Medical History:  Diagnosis Date   Anemia    Asthma    Back pain    Cataract    Diabetes mellitus without complication (HCC)    GERD (gastroesophageal reflux disease)    History of echocardiogram    Hypertension    Morbid obesity with BMI of 60.0-69.9, adult (HCC)    Restless leg syndrome    Medications:  No PTA anticoagulation  DVT prophylactic LMWH given 5/9 @ 2013   Assessment: 63 year old female found to be in atrial flutter with RVR. From chart review, patient was not on any anticoagulation prior to admission. Pharmacy has been consulted for initiation and management of a heparin  infusion.  Baseline labs: Hgb 10.0, PLT 167, aPTT & INR have been ordered.   Goal of Therapy:  Heparin  level 0.3-0.7 units/ml Monitor platelets by anticoagulation protocol: Yes  0510 @ 1830: HL >1.10 = Supratherapeutic 05/11 0331 HL 0.29, slightly subtherapeutic 05/11 1150 HL 0.34, therapeutic  05/11 1747 HL 0.48, therapeutic x 2    Plan: HL therapeutic x 2 Continue heparin  infusion at 1000 units/hr  CBC and HL daily while on heparin  infusion F/u transition to PO anticoagulation if indicated  Ramonita Burow, PharmD 12/27/2023 6:21 PM

## 2023-12-27 NOTE — Progress Notes (Signed)
 OT Cancellation Note  Patient Details Name: Kristina Cruz MRN: [id number] DOB: [birth year]   Cancelled Treatment:    Reason Eval/Treat Not Completed: Patient not medically ready. Pt HR 144/min at rest. OT communicated with the MD and MD advised to hold on this date. OT will attempt on next available date/time when pt is medically ready.   Rosaria Common M.S. OTR/L  12/27/23, 12:01 PM

## 2023-12-27 NOTE — Progress Notes (Addendum)
 Cardiology Progress Note   Patient Name: Kristina Cruz Date of Encounter: 12/27/2023  Primary Cardiologist: Belva Boyden, MD  Subjective   Remains in afib, rates 1-teens to 120's.  Asymptomatic but had a rough night due to abdominal discomfort.  No further diarrhea.  No c/p or dyspnea. Objective   Inpatient Medications    Scheduled Meds:  Chlorhexidine  Gluconate Cloth  6 each Topical Q0600   fluticasone  furoate-vilanterol  1 puff Inhalation Daily   gabapentin   100 mg Oral TID   insulin  aspart  0-20 Units Subcutaneous TID WC   leptospermum manuka honey  1 Application Topical Daily   metoprolol tartrate  12.5 mg Oral Q6H   rOPINIRole   2 mg Oral BID   sodium chloride  flush  3 mL Intravenous Q12H   Continuous Infusions:  ceFEPime (MAXIPIME) IV 2 g (12/27/23 0945)   heparin  1,000 Units/hr (12/27/23 0534)   metronidazole 500 mg (12/26/23 2240)   vancomycin      PRN Meds: acetaminophen  **OR** acetaminophen , ipratropium-albuterol , morphine , ondansetron  **OR** ondansetron  (ZOFRAN ) IV, oxyCODONE    Vital Signs    Vitals:   12/26/23 2250 12/26/23 2323 12/27/23 0439 12/27/23 0736  BP: 96/78 113/74 118/65 95/73  Pulse: 90 100 (!) 111 98  Resp:  18  16  Temp:  98.4 F (36.9 C) 98.2 F (36.8 C) 98.9 F (37.2 C)  TempSrc:  Oral Oral Oral  SpO2:  95% 96% 99%  Weight:      Height:        Intake/Output Summary (Last 24 hours) at 12/27/2023 0948 Last data filed at 12/27/2023 0600 Gross per 24 hour  Intake 952.74 ml  Output --  Net 952.74 ml   Filed Weights   12/25/23 0922  Weight: 112.6 kg    Physical Exam   GEN: Well nourished, well developed, in no acute distress.  HEENT: Grossly normal.  Neck: Supple, no JVD, carotid bruits, or masses. Cardiac: IR, IR, tachy, no murmurs, rubs, or gallops. No clubbing, cyanosis, 1+ bilat LE edema.  Radials 2+, DP/PT 2+ and equal bilaterally.  Respiratory:  Respirations regular and unlabored, clear to auscultation  bilaterally. GI: Soft, nontender, nondistended, BS + x 4. MS: no deformity or atrophy. Skin: warm and dry, no rash. Neuro:  Strength and sensation are intact. Psych: AAOx3.  Normal affect.  Labs    Chemistry Recent Labs  Lab 12/25/23 0946 12/26/23 0300 12/27/23 0331  NA 131* 132* 135  K 3.0* 3.9 3.8  CL 93* 99 103  CO2 24 25 24   GLUCOSE 219* 207* 134*  BUN 29* 21 16  CREATININE 1.18* 0.97 0.92  CALCIUM  8.1* 7.7* 7.7*  PROT 7.0 6.0* 5.9*  ALBUMIN  2.7* 2.4* 2.3*  AST 24 23 21   ALT 15 13 13   ALKPHOS 98 85 83  BILITOT 1.5* 1.0 1.0  GFRNONAA 51* >60 >60  ANIONGAP 14 8 8      Hematology Recent Labs  Lab 12/25/23 0946 12/26/23 0300 12/27/23 0331  WBC 24.6* 25.5* 25.3*  RBC 3.84* 3.62* 3.46*  HGB 10.7* 10.0* 9.7*  HCT 31.3* 29.6* 28.7*  MCV 81.5 81.8 82.9  MCH 27.9 27.6 28.0  MCHC 34.2 33.8 33.8  RDW 15.5 15.3 15.7*  PLT 164 167 153    Cardiac Enzymes  Recent Labs  Lab 12/25/23 0946 12/25/23 1140  TROPONINIHS 12 14      BNP    Component Value Date/Time   BNP 109.6 (H) 12/25/2023 0946   DDimer  Recent Labs  Lab 12/26/23 0320  DDIMER 3.50*    HbA1c  Lab Results  Component Value Date   HGBA1C 10.9 (H) 12/25/2023    Radiology    US  Venous Img Lower Bilateral (DVT) Result Date: 12/25/2023 CLINICAL DATA:  Lower extremity edema for 2 weeks EXAM: Bilateral Lower Extremity Venous Doppler Ultrasound TECHNIQUE: Gray-scale sonography with compression, as well as color and duplex ultrasound, were performed to evaluate the deep venous system(s) from the level of the common femoral vein through the popliteal and proximal calf veins. COMPARISON:  None available FINDINGS: VENOUS Normal compressibility of the common femoral, superficial femoral, and popliteal veins, as well as the visualized calf veins. Visualized portions of profunda femoral vein and great saphenous vein unremarkable. No filling defects to suggest DVT on grayscale or color Doppler imaging. Doppler  waveforms show normal direction of venous flow, normal respiratory plasticity and response to augmentation. OTHER None. Limitations: none IMPRESSION: No lower extremity DVT. Electronically Signed   By: Elester Grim M.D.   On: 12/25/2023 17:29   CT CHEST WO CONTRAST Result Date: 12/25/2023 CLINICAL DATA:  Bilateral lower extremity edema. History of pancreatic cancer. EXAM: CT CHEST WITHOUT CONTRAST TECHNIQUE: Multidetector CT imaging of the chest was performed following the standard protocol without IV contrast. RADIATION DOSE REDUCTION: This exam was performed according to the departmental dose-optimization program which includes automated exposure control, adjustment of the mA and/or kV according to patient size and/or use of iterative reconstruction technique. COMPARISON:  August 21, 2023. FINDINGS: Cardiovascular: Atherosclerosis of thoracic aorta without aneurysm formation. Mild cardiomegaly. No pericardial effusion. Right internal jugular Port-A-Cath is noted. Mediastinum/Nodes: No enlarged mediastinal or axillary lymph nodes. Thyroid gland, trachea, and esophagus demonstrate no significant findings. Lungs/Pleura: No pneumothorax or pleural effusion is noted. Right lung is clear. 3.5 x 2.6 cm cavitating lesion is noted in left lower lobe which may represent cavitary pneumonia or possibly metastatic disease. Upper Abdomen: Interval development of multiple rounded low densities throughout the left and right hepatic lobes consistent with metastatic disease. Pancreatic mass is again noted consistent with history of pancreatic malignancy. Musculoskeletal: No chest wall mass or suspicious bone lesions identified. IMPRESSION: Interval development of multiple rounded hepatic low densities consistent with metastatic disease. Pancreatic mass is again noted consistent with history of pancreatic malignancy. 3.5 x 2.6 cm cavitating lesion is noted in left lower lobe which may represent cavitary pneumonia or possibly  metastatic disease. Electronically Signed   By: Rosalene Colon M.D.   On: 12/25/2023 13:53   CT ABDOMEN PELVIS W CONTRAST Result Date: 12/25/2023 CLINICAL DATA:  Sepsis. * Tracking Code: BO * EXAM: CT ABDOMEN AND PELVIS WITH CONTRAST TECHNIQUE: Multidetector CT imaging of the abdomen and pelvis was performed using the standard protocol following bolus administration of intravenous contrast. RADIATION DOSE REDUCTION: This exam was performed according to the departmental dose-optimization program which includes automated exposure control, adjustment of the mA and/or kV according to patient size and/or use of iterative reconstruction technique. CONTRAST:  OMNIPAQUE  IOHEXOL  300 MG/ML  SOLN COMPARISON:  CT scan abdomen and pelvis from 11/05/2023. FINDINGS: Lower chest: Since the prior study, there is a new thick-walled cavitary lesion in the left lung lower lobe measuring 2.4 x 2.8 cm. Visualized bilateral lung bases are otherwise clear. No pleural effusion. Normal heart size. No pericardial effusion. Hepatobiliary: The liver is normal in size. Non-cirrhotic configuration. Redemonstration of multiple (more than 25), hypoattenuating liver lesions with dominant lesion in the right hepatic lobe measuring up to 2.2 x 2.3 cm, which previously measured  up to 1.5 x 1.8 cm. Findings are compatible with worsening metastases. No intrahepatic or extrahepatic bile duct dilation. Gallbladder is surgically absent. Pancreas: Redemonstration of ill-defined heterogeneous predominantly hypoattenuating mass centered within the pancreatic tail and distal body measuring approximately 4.2 x 7.4 cm, grossly unchanged since the prior study and highly concerning for pancreatic neoplasm. Rest of the pancreas is unremarkable. Main pancreatic duct is not dilated. No peripancreatic fat stranding. 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 Spleen: Normal size spleen.  Redemonstration of a hypoattenuating 2.2 x 2.6 cm lesion along the superolateral aspect, which has increased in size since the prior study. There is a probable new sub 5 mm subcapsular focus in the posterosuperior aspect, which is concerning for new metastases. Adrenals/Urinary Tract: Adrenal glands are unremarkable. No suspicious renal mass. No hydronephrosis. No renal or ureteric calculi. Unremarkable urinary bladder. Stomach/Bowel: No disproportionate dilation of the small or large bowel loops. No evidence of abnormal bowel wall thickening or inflammatory changes. The appendix is unremarkable. There are multiple diverticula mainly in the sigmoid colon, without imaging signs of diverticulitis. Vascular/Lymphatic: No ascites or pneumoperitoneum. No abdominal or pelvic lymphadenopathy, by size criteria. No aneurysmal dilation of the major abdominal arteries. There are mild peripheral atherosclerotic vascular calcifications of the aorta and its major branches. Reproductive: The uterus is surgically absent. No large adnexal mass. Other: Note is again made of thinned/lax anterior abdominal wall. There is a small fat containing right paramedian periumbilical hernia. The soft tissues and abdominal wall are otherwise unremarkable. Musculoskeletal: No suspicious osseous lesions. There are moderate multilevel degenerative changes in the visualized spine. Bilateral marked hip joint arthritis noted IMPRESSION: 1. Redemonstration of ill-defined heterogeneous hypoattenuating mass centered within the pancreatic tail and distal body, highly concerning for pancreatic neoplasm. 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 2. Redemonstration of multiple hypoattenuating liver lesions, which have increased in size since the prior study, compatible with worsening metastases. 3. There is a new thick-walled cavitary lesion in the left lung lower lobe measuring 2.4 x 2.8 cm.  This is indeterminate and differential diagnosis includes metastases versus infectious/inflammatory etiology. Consider dedicated chest imaging. 4. Multiple other nonacute observations, as described above. Aortic Atherosclerosis (ICD10-I70.0). Electronically Signed   By: Beula Brunswick M.D.   On: 12/25/2023 11:37   DG Chest Port 1 View Result Date: 12/25/2023 CLINICAL DATA:  Suspected sepsis EXAM: PORTABLE CHEST 1 VIEW COMPARISON:  09/28/2023 FINDINGS: Numerous leads and wires project over the chest. Patient is rotated to the left. Mildly degraded exam due to AP portable technique and patient body habitus. Mild cardiomegaly. No right-sided pleural effusion. Left costophrenic angle is obscured by overlying breast tissue. IMPRESSION: Degraded exam secondary to AP portable technique and patient body habitus. Especially the inferior left hemithorax is poorly evaluated secondary to overlying soft tissues. Given this limitation, cardiomegaly and low lung volumes without acute disease. If there is a clinical concern of left lower lobe pneumonia, recommend PA and lateral radiographs. Electronically Signed   By: Lore Rode M.D.   On: 12/25/2023 10:51     Telemetry    Afib, 1-teens to 120's - Personally Reviewed  Cardiac Studies   2D Echocardiogram 5.10.2025   1. Left ventricular ejection fraction, by estimation, is 55 to 60%. Left  ventricular ejection fraction by PLAX is 51 %. The left ventricle has  normal function. The left ventricle has no regional wall  motion  abnormalities. There is moderate left  ventricular hypertrophy. Left ventricular diastolic parameters are  indeterminate.   2. Right ventricular systolic function is mildly reduced. The right  ventricular size is normal. Tricuspid regurgitation signal is inadequate  for assessing PA pressure.   3. The mitral valve is normal in structure. No evidence of mitral valve  regurgitation. No evidence of mitral stenosis.   4. The aortic valve is  normal in structure. Aortic valve regurgitation is  not visualized. No aortic stenosis is present.   5. The inferior vena cava is normal in size with greater than 50%  respiratory variability, suggesting right atrial pressure of 3 mmHg.   Patient Profile     63 y.o. female  with a history of stage V pancreatic cancer w/ liver mets on weekly chemoRx, DMII, HTN, obesity, asthma, and COPD, who was admitted w/ rapid atrial flutter, diarrhea, and sepsis.  Assessment & Plan    1.  Atrial flutter/Atrial fibrillation w/ RVR:  Pt admitted w/ a 2 wk h/o progressive diarrhea, anorexia, and malaise. Found to be tachycardic @ Onc visit 5/9, prompting admission. WCT in setting of known RBBB, more or less locked in between 142-144 following admission.  Given adenosine 6 mg IV x 1 w/ evidence of flutter waves followed by afib w/ rates now trending in the 1-teens to 120's. Pt asymptomatic.  Pressures relatively soft.  Cont metoprolol 12.5 q6h.  She received 2 doses of IV digoxin on 5/9.  Will add oral digoxin 0.125 mg daily.  F/u digoxin level later this week (ordered for 5/13).  Discussed options for mgmt w/ pt.  Provided she doesn't convert to sinus rhythm today, we will plan on TEE/DCCV tomorrow, likely around lunch time.  Cont heparin  w/ plan to convert to oral eliquis following DCCV. Informed Consent   Shared Decision Making/Informed Consent   The risks [stroke, cardiac arrhythmias rarely resulting in the need for a temporary or permanent pacemaker, skin irritation or burns, esophageal damage, perforation (1:10,000 risk), bleeding, pharyngeal hematoma as well as other potential complications associated with conscious sedation including aspiration, arrhythmia, respiratory failure and death], benefits (treatment guidance, restoration of normal sinus rhythm, diagnostic support) and alternatives of a transesophageal echocardiogram guided cardioversion were discussed in detail with Ms. Jurewicz and she is willing to  proceed.    2.  Sepsis/gastroenteritis/cavitary lung lesion:  Cont to have some GI upset, but no further diarrhea.  Pressures soft but stable.  Abx/IVF per medicine team.  3.  AKI:  in setting of #2.  Resolved.  4.  Lower ext edema:  stable in setting of hypoalbuminemia, poor appetite, diarrhea.  Nl LV fxn.  Would benefit from compression. Avoid diuretics in setting of above.  5.  DMII:  A1c 10.9.  Per IM.  6.  Metastatic pancreatic cancer:  followed by heme/onc w/ weekly outpt chemo.  Imaging this admission w/ progression.  Signed, Laneta Pintos, NP  12/27/2023, 9:48 AM    For questions or updates, please contact   Please consult [email or web address] for contact info under Cardiology/STEMI.

## 2023-12-27 NOTE — Consult Note (Signed)
 Consultation Note Date: 12/27/2023 at 1055  Patient Name: Kristina Cruz  DOB: Jul 14, 1961  MRN: [id number]  Age / Sex: 63 y.o., female  PCP: Chucky Craver, FNP Referring Physician: Brenna Cam, MD  HPI/Patient Profile: 63 y.o. female with past medical history significant for stage IV pancreatic cancer with metastasis to liver on chemotherapy, anemia, uncontrolled DM II, HTN, COPD, asthma and morbid obesity. She presented to ED from the cancer center c/o N/V/D x 1 week, weakness, poor PO intake, productive cough and bilateral LE edema. Blood work at cancer center showed WBC 24.   ED workup found patient to be normotensive at 107/76 but subsequently became hypotensive with VS 77/66, HR 150, 96% RA, RR 24 and 97.6.  ED labs showed WBC 24.6, Hgb 10.7, K+ 3.0, glucose 219, creatinine 1.18, GFR 51, BNP 109, troponin 12->14, lactic 1.6->2.0.  CT AP revealed new cavitary lesion in left lower lobe measuring 2.4 x 2.8 cm concerning for infection vs. metastasis as well as known pancreatic and liver malignancy.   She was admitted to [facility] for further evaluation and management of severe sepsis, acute heart failure, gastroenteritis and new cavitary lung lesion   Palliative consulted for goals of care discussion. She is currently followed by outpatient palliative, Lilian Register, NP, at the cancer center.   Clinical Assessment and Goals of Care: Extensive chart review completed prior to meeting patient including labs, vital signs, imaging, progress notes, orders, and available advanced directive documents from current and previous encounters. I then met with patient  to discuss diagnosis prognosis, GOC, EOL wishes, disposition and options.  I introduced Palliative Medicine as specialized medical care for people living with serious illness. It focuses on providing relief from the symptoms and stress of a serious illness. The  goal is to improve quality of life for both the patient and the family. Explained that I do the same thing in the hospital that Josh does for her in the cancer center.   Ill-appearing female resting in bed with emesis bag in hand. She denies nausea at this time and reports nausea medicine is working. She denies pain, SOB, CP. She is not receptive to GOC discussion today and is requesting to take a nap. However, patient does request this NP check in on her tomorrow.  Advance directives, concepts specific to code status and rehospitalization were considered and discussed. The patient is adamant that she does not want to be a DNR. She informs that she was confused when the DNR paper was completed. She requests this NP remove DNR from her chart. She wants all medical treatment and be a full code. Request placed under ACP documents for removal of DNR.   Family is facing treatment option decisions, advanced directive, and anticipatory care needs.    Discussed with patient/family the importance of continued conversation with family and the medical providers regarding overall plan of care and treatment options, ensuring decisions are within the context of the patient's values and GOCs.    Questions and concerns were addressed. The family  was encouraged to call with questions or concerns.   Primary Decision Maker PATIENT  Physical Exam Vitals reviewed.  Constitutional:      General: She is not in acute distress.    Appearance: She is obese. She is ill-appearing.  HENT:     Head: Normocephalic and atraumatic.     Mouth/Throat:     Mouth: Mucous membranes are moist.  Pulmonary:     Effort: Pulmonary effort is normal. No respiratory distress.  Skin:    General: Skin is warm and dry.  Neurological:     Mental Status: She is alert and oriented to person, place, and time.  Psychiatric:        Mood and Affect: Mood normal.        Behavior: Behavior normal.        Thought Content: Thought content  normal.        Judgment: Judgment normal.    Recommendations/Plan: FULL CODE status as previously documented    Continue current supportive interventions PMT to follow for continued GOC conversation   Thank you for this consult. Palliative medicine will continue to follow and assist holistically.   Time Total: 55 minutes  Time spent includes: Detailed review of medical records (labs, imaging, vital signs), medically appropriate exam (mental status, respiratory, cardiac, skin), discussed with treatment team, counseling and educating patient, family and staff, documenting clinical information, medication management and coordination of care.     Ina Manas, Joyice Nodal- [facility] Palliative Medicine Team  12/27/2023 11:20 AM  Office [phone]  Pager [phone]     Please contact Palliative Medicine Team providers via AMION for questions and concerns.

## 2023-12-27 NOTE — Consult Note (Signed)
 PHARMACY - ANTICOAGULATION CONSULT NOTE  Pharmacy Consult for Heparin  Indication: Atrial Flutter   Allergies  Allergen Reactions   Peanut-Containing Drug Products    Patient Measurements: Height: 5\' 2"  (157.5 cm) Weight: 112.6 kg (248 lb 3.8 oz) IBW/kg (Calculated) : 50.1 HEPARIN  DW (KG): 77.6  Vital Signs: Temp: 98.2 F (36.8 C) (05/11 0439) Temp Source: Oral (05/11 0439) BP: 118/65 (05/11 0439) Pulse Rate: 111 (05/11 0439)  Labs: Recent Labs    12/25/23 0816 12/25/23 0945 12/25/23 0946 12/25/23 1140 12/26/23 0300 12/26/23 1043 12/26/23 1830 12/27/23 0331  HGB   < >  --  10.7*  --  10.0*  --   --  9.7*  HCT  --   --  31.3*  --  29.6*  --   --  28.7*  PLT  --   --  164  --  167  --   --  153  APTT  --   --   --   --   --  40*  --   --   LABPROT  --  15.8*  --   --   --  16.8*  --   --   INR  --  1.2  --   --   --  1.3*  --   --   HEPARINUNFRC  --   --   --   --   --   --  >1.10* 0.29*  CREATININE  --   --  1.18*  --  0.97  --   --  0.92  TROPONINIHS  --   --  12 14  --   --   --   --    < > = values in this interval not displayed.   Estimated Creatinine Clearance: 72.3 mL/min (by C-G formula based on SCr of 0.92 mg/dL).  Medical History: Past Medical History:  Diagnosis Date   Anemia    Asthma    Back pain    Cataract    Diabetes mellitus without complication (HCC)    GERD (gastroesophageal reflux disease)    History of echocardiogram    Hypertension    Morbid obesity with BMI of 60.0-69.9, adult (HCC)    Restless leg syndrome    Medications:  No PTA anticoagulation  DVT prophylactic LMWH given 5/9 @ 2013   Assessment: 63 year old female found to be in atrial flutter with RVR. From chart review, patient was not on any anticoagulation prior to admission. Pharmacy has been consulted for initiation and management of a heparin  infusion. Baseline labs: Hgb 10.0, PLT 167, aPTT & INR have been ordered.   Goal of Therapy:  Heparin  level 0.3-0.7  units/ml Monitor platelets by anticoagulation protocol: Yes  0510 @ 1830: HL >1.10 = Supratherapeutic 05/11 0331 HL 0.29, slightly subtherapeutic   Plan: Increase heparin  infusion to rate of 1000 units/hr Recheck heparin  level in 6 hours after rate change CBC daily while on heparin  infusion F/u transition to PO anticoagulation if indicated  Coretta Dexter, PharmD, [facility] 12/27/2023 5:19 AM

## 2023-12-27 NOTE — Consult Note (Signed)
 PHARMACY CONSULT NOTE - ELECTROLYTES  Pharmacy Consult for Electrolyte Monitoring and Replacement   Recent Labs: Height: 5\' 2"  (157.5 cm) Weight: 112.6 kg (248 lb 3.8 oz) IBW/kg (Calculated) : 50.1 Estimated Creatinine Clearance: 72.3 mL/min (by C-G formula based on SCr of 0.92 mg/dL). Potassium (mmol/L)  Date Value  12/27/2023 3.8  04/10/2014 3.4 (L)   Magnesium  (mg/dL)  Date Value  16/05/9603 2.1   Calcium  (mg/dL)  Date Value  54/04/8118 7.7 (L)   Calcium , Total (mg/dL)  Date Value  14/78/2956 8.8   Albumin  (g/dL)  Date Value  21/30/8657 2.3 (L)  04/10/2014 3.1 (L)   Phosphorus (mg/dL)  Date Value  84/69/6295 2.5   Sodium (mmol/L)  Date Value  12/27/2023 135  04/10/2014 143    Assessment  Kristina Cruz is a 63 y.o. female presenting with weakness. PMH significant for stage 5 pancreatic cancer with liver metastasis on chemotherapy, T2DM, HTN, COPD, asthma. Pharmacy has been consulted to monitor and replace electrolytes.  Diet: regular MIVF: N/A Pertinent medications: N/A  Goal of Therapy: Electrolytes WNL  Plan:  No replacement currently warranted Check BMP, Mg, Phos with AM labs  Thank you for allowing pharmacy to be a part of this patient's care.  English Tomer A Lizett Chowning, PharmD Clinical Pharmacist 12/27/2023 7:54 AM

## 2023-12-27 NOTE — Plan of Care (Signed)

## 2023-12-27 NOTE — Consult Note (Signed)
 Pharmacy Antibiotic Note  Kristina Cruz is a 62 y.o. female admitted on 12/25/2023 with generalized weakness.  Pharmacy has been consulted for Vancomycin  and Cefepime dosing.  Day 3 of antibiotics. Patient still struggling with oral medications. Renal function continues to improve  Plan: Adjust Vancomycin  dose to: 1250 mg IV Q 24 hrs. Goal AUC 400-550. Expected AUC: 499.8 Expected Cmin: 12.5 SCr used: 0.92  Adjust Cefepime dose to: 2g IV Q 8 hrs   Height: 5\' 2"  (157.5 cm) Weight: 112.6 kg (248 lb 3.8 oz) IBW/kg (Calculated) : 50.1  Temp (24hrs), Avg:98.4 F (36.9 C), Min:98 F (36.7 C), Max:98.9 F (37.2 C)  Recent Labs  Lab 12/25/23 0816 12/25/23 0945 12/25/23 0946 12/25/23 1140 12/25/23 2235 12/26/23 0300 12/27/23 0331  WBC 24.4*  --  24.6*  --   --  25.5* 25.3*  CREATININE 1.33*  --  1.18*  --   --  0.97 0.92  LATICACIDVEN  --  1.6  --  2.0* 1.8  --   --     Estimated Creatinine Clearance: 72.3 mL/min (by C-G formula based on SCr of 0.92 mg/dL).    Allergies  Allergen Reactions   Peanut-Containing Drug Products     Antimicrobials this admission: Vancomycin  5/9 >>  Cefepime 5/9 >>  Flagyl 5/9 >>  Dose adjustments this admission: N/A  Microbiology results: 5/9 BCx: NGTD 5/9 MRSA PCR: pending  Thank you for allowing pharmacy to be a part of this patient's care.  Kristina Cruz A Kristina Cruz 12/27/2023 7:59 AM

## 2023-12-28 ENCOUNTER — Other Ambulatory Visit: Payer: Self-pay

## 2023-12-28 ENCOUNTER — Inpatient Hospital Stay
Admit: 2023-12-28 | Discharge: 2023-12-28 | Disposition: A | Discharge status: Home / Self Care | Attending: Nurse Practitioner | Admitting: Nurse Practitioner

## 2023-12-28 ENCOUNTER — Encounter
Admission: EM | Disposition: A | Discharge status: Home Health | Payer: Self-pay | Source: Home / Self Care | Attending: Internal Medicine

## 2023-12-28 ENCOUNTER — Inpatient Hospital Stay: Admitting: Certified Registered Nurse Anesthetist

## 2023-12-28 DIAGNOSIS — I4891 Unspecified atrial fibrillation: Secondary | ICD-10-CM

## 2023-12-28 DIAGNOSIS — A419 Sepsis, unspecified organism: Secondary | ICD-10-CM | POA: Diagnosis not present

## 2023-12-28 DIAGNOSIS — I509 Heart failure, unspecified: Secondary | ICD-10-CM | POA: Diagnosis not present

## 2023-12-28 DIAGNOSIS — I361 Nonrheumatic tricuspid (valve) insufficiency: Secondary | ICD-10-CM | POA: Diagnosis not present

## 2023-12-28 DIAGNOSIS — Z515 Encounter for palliative care: Secondary | ICD-10-CM | POA: Diagnosis not present

## 2023-12-28 DIAGNOSIS — N179 Acute kidney failure, unspecified: Secondary | ICD-10-CM | POA: Diagnosis not present

## 2023-12-28 DIAGNOSIS — Z7189 Other specified counseling: Secondary | ICD-10-CM | POA: Diagnosis not present

## 2023-12-28 DIAGNOSIS — I4819 Other persistent atrial fibrillation: Secondary | ICD-10-CM | POA: Diagnosis not present

## 2023-12-28 LAB — CBC
HCT: 28.1 % — ABNORMAL LOW (ref 36.0–46.0)
Hemoglobin: 9.3 g/dL — ABNORMAL LOW (ref 12.0–15.0)
MCH: 27.6 pg (ref 26.0–34.0)
MCHC: 33.1 g/dL (ref 30.0–36.0)
MCV: 83.4 fL (ref 80.0–100.0)
Platelets: 186 10*3/uL (ref 150–400)
RBC: 3.37 MIL/uL — ABNORMAL LOW (ref 3.87–5.11)
RDW: 15.8 % — ABNORMAL HIGH (ref 11.5–15.5)
WBC: 25.2 10*3/uL — ABNORMAL HIGH (ref 4.0–10.5)
nRBC: 2.6 % — ABNORMAL HIGH (ref 0.0–0.2)

## 2023-12-28 LAB — GLUCOSE, CAPILLARY
Glucose-Capillary: 127 mg/dL — ABNORMAL HIGH (ref 70–99)
Glucose-Capillary: 109 mg/dL — ABNORMAL HIGH (ref 70–99)
Glucose-Capillary: 169 mg/dL — ABNORMAL HIGH (ref 70–99)
Glucose-Capillary: 86 mg/dL (ref 70–99)

## 2023-12-28 LAB — BASIC METABOLIC PANEL WITH GFR
Anion gap: 8 (ref 5–15)
BUN: 13 mg/dL (ref 8–23)
CO2: 25 mmol/L (ref 22–32)
Calcium: 7.8 mg/dL — ABNORMAL LOW (ref 8.9–10.3)
Chloride: 104 mmol/L (ref 98–111)
Creatinine, Ser: 1.03 mg/dL — ABNORMAL HIGH (ref 0.44–1.00)
GFR, Estimated: >60 mL/min (ref >60)
Glucose, Bld: 132 mg/dL — ABNORMAL HIGH (ref 70–99)
Potassium: 3.8 mmol/L (ref 3.5–5.1)
Sodium: 137 mmol/L (ref 135–145)

## 2023-12-28 LAB — HEPARIN LEVEL (UNFRACTIONATED): Heparin Unfractionated: 0.47 [IU]/mL (ref 0.30–0.70)

## 2023-12-28 LAB — ECHO TEE

## 2023-12-28 SURGERY — ECHOCARDIOGRAM, TRANSESOPHAGEAL
Anesthesia: General

## 2023-12-28 MED ORDER — LIDOCAINE HCL (PF) 2 % IJ SOLN
INTRAMUSCULAR | Status: DC | PRN
Start: 2023-12-28 — End: 2023-12-28
  Administered 2023-12-28: 100 mg via INTRADERMAL

## 2023-12-28 MED ORDER — ENSURE ENLIVE PO LIQD
237.0000 mL | Freq: Three times a day (TID) | ORAL | Status: DC
Start: 2023-12-28 — End: 2023-12-30
  Administered 2023-12-29 – 2023-12-30 (×3): 237 mL via ORAL

## 2023-12-28 MED ORDER — ZINC SULFATE 220 (50 ZN) MG PO CAPS
220.0000 mg | ORAL_CAPSULE | Freq: Every day | ORAL | Status: DC
  Administered 2023-12-28 – 2023-12-30 (×3): 220 mg via ORAL
  Filled 2023-12-28 (×3): qty 1

## 2023-12-28 MED ORDER — BUTAMBEN-TETRACAINE-BENZOCAINE 2-2-14 % EX AERO
INHALATION_SPRAY | CUTANEOUS | Status: AC
  Filled 2023-12-28: qty 5

## 2023-12-28 MED ORDER — PHENYLEPHRINE 80 MCG/ML (10ML) SYRINGE FOR IV PUSH (FOR BLOOD PRESSURE SUPPORT)
PREFILLED_SYRINGE | INTRAVENOUS | Status: AC
  Filled 2023-12-28: qty 10

## 2023-12-28 MED ORDER — EPHEDRINE 5 MG/ML INJ
INTRAVENOUS | Status: AC
  Filled 2023-12-28: qty 5

## 2023-12-28 MED ORDER — PROSOURCE PLUS PO LIQD
30.0000 mL | Freq: Three times a day (TID) | ORAL | Status: DC
  Administered 2023-12-29 – 2023-12-30 (×3): 30 mL via ORAL
  Filled 2023-12-28: qty 30

## 2023-12-28 MED ORDER — ADULT MULTIVITAMIN W/MINERALS CH
1.0000 | ORAL_TABLET | Freq: Every day | ORAL | Status: DC
  Administered 2023-12-28 – 2023-12-30 (×3): 1 via ORAL
  Filled 2023-12-28 (×3): qty 1

## 2023-12-28 MED ORDER — GABAPENTIN 100 MG PO CAPS: 100.0000 mg | ORAL_CAPSULE | Freq: Three times a day (TID) | ORAL | Status: DC | PRN

## 2023-12-28 MED ORDER — NYSTATIN 100000 UNIT/ML MT SUSP
5.0000 mL | Freq: Four times a day (QID) | OROMUCOSAL | Status: DC
  Administered 2023-12-28 – 2023-12-30 (×9): 500000 [IU] via OROMUCOSAL
  Filled 2023-12-28 (×9): qty 5

## 2023-12-28 MED ORDER — PROPOFOL 10 MG/ML IV BOLUS
INTRAVENOUS | Status: DC | PRN
  Administered 2023-12-28: 20 mg via INTRAVENOUS
  Administered 2023-12-28 (×2): 40 mg via INTRAVENOUS
  Administered 2023-12-28: 100 mg via INTRAVENOUS

## 2023-12-28 MED ORDER — SODIUM CHLORIDE 0.9% FLUSH: 3.0000 mL | INTRAVENOUS | Status: DC | PRN

## 2023-12-28 MED ORDER — LIDOCAINE VISCOUS HCL 2 % MT SOLN
OROMUCOSAL | Status: AC
  Filled 2023-12-28: qty 15

## 2023-12-28 MED ORDER — SODIUM CHLORIDE 0.9 % IV SOLN: INTRAVENOUS | Status: DC | PRN

## 2023-12-28 MED ORDER — VITAMIN C 500 MG PO TABS
500.0000 mg | ORAL_TABLET | Freq: Two times a day (BID) | ORAL | Status: DC
  Administered 2023-12-28 – 2023-12-30 (×5): 500 mg via ORAL
  Filled 2023-12-28 (×5): qty 1

## 2023-12-28 MED ORDER — SODIUM CHLORIDE 0.9% FLUSH: 3.0000 mL | Freq: Two times a day (BID) | INTRAVENOUS | Status: DC

## 2023-12-28 MED ORDER — PHENYLEPHRINE 80 MCG/ML (10ML) SYRINGE FOR IV PUSH (FOR BLOOD PRESSURE SUPPORT)
PREFILLED_SYRINGE | INTRAVENOUS | Status: DC | PRN
  Administered 2023-12-28: 240 ug via INTRAVENOUS
  Administered 2023-12-28: 160 ug via INTRAVENOUS
  Administered 2023-12-28 (×4): 240 ug via INTRAVENOUS

## 2023-12-28 MED ORDER — LIDOCAINE HCL (PF) 2 % IJ SOLN
INTRAMUSCULAR | Status: AC
  Filled 2023-12-28: qty 5

## 2023-12-28 NOTE — Progress Notes (Signed)
*  PRELIMINARY RESULTS* Echocardiogram Echocardiogram Transesophageal has been performed.  Kristina Cruz 12/28/2023, 1:29 PM

## 2023-12-28 NOTE — Progress Notes (Signed)
 Initial Nutrition Assessment  DOCUMENTATION CODES:   Morbid obesity  INTERVENTION:   -Continue regular diet -MVI with minerals daily -500 mg vitamin C BID -220 mg zinc sulfate daily x 14 days -Magic cup TID with meals, each supplement provides 290 kcal and 9 grams of protein  -Ensure Enlive po TID, each supplement provides 350 kcal and 20 grams of protein.  -30 ml Prosource Plus TID, each supplement provides 100 kcals and 15 grams protein   NUTRITION DIAGNOSIS:   Increased nutrient needs related to cancer and cancer related treatments as evidenced by estimated needs.  GOAL:   Patient will meet greater than or equal to 90% of their needs  MONITOR:   PO intake, Supplement acceptance  REASON FOR ASSESSMENT:   Consult Assessment of nutrition requirement/status, Poor PO  ASSESSMENT:   Pt with medical history significant of Stage V pancreatic cancer with liver metastasis on chemotherapy, uncontrolled type 2 diabetes, hypertension, COPD and asthma, morbid obesity, who presents due to weakness  Pt admitted with severe sepsis and acute heart failure.   5/12- s/p TEE with cardioversion  Reviewed I/O's: +1.6 L x 24 hours and +6.8 L since admission  Per [facility] notes, pt with stage 3 pressure injury to buttocks.   Per H&P, pt has been experiencing nausea, vomiting, and diarrhea for 1 week PTA. Also, new cavitary lesion in lt lower lobe of lung discovered incidentally- thought to be related to possible source of sepsis secondary to immuno compromised status vs new metastasis secondary to stage IV pancreatic cancer. Pt currently on weekly chemo and followed by Lilian Register, palliative care at cancer center. Palliative care following here; pt desires full code.   Pt drowsy at time of visit. No family at bedside. Pt shares that she is hungry and requesting something to eat. RN reached out to MD and obtained regular diet. Per chart review, pt has complained of taste changes and poor  appetite. Given poor oral intake and stable blood sugars, agree with regular diet.   Reviewed wt hx; pt has experienced a 14.5% wt loss over the past 6 months, which is significant for time frame. Pt also with edema, which may be masking true weight loss as well as fat and muscle depletions.   Obesity is a complex, chronic medical condition that is optimally managed by a multidisciplinary care team. Weight loss is not an ideal goal for an acute inpatient hospitalization. However, if further work-up for obesity is warranted, consider outpatient referral to [facility].    Pt with poor oral intake and would benefit from nutrient dense supplement. One Ensure Enlive supplement provides 350 kcals, 20 grams protein, and 44-45 grams of carbohydrate vs one Glucerna shake supplement, which provides 220 kcals, 10 grams of protein, and 26 grams of carbohydrate. Given pt's hx of DM, RD will reassess adequacy of PO intake, CBGS, and adjust supplement regimen as appropriate at follow-up.    Lab Results  Component Value Date   HGBA1C 10.9 (H) 12/25/2023   PTA DM medications are 1000 mg metformin  BID and 1 mg semaglutide daily.   Labs reviewed: CBGS: 109-130 (inpatient orders for glycemic control are 0-20 units insulin  aspart TID with meals).    NUTRITION - FOCUSED PHYSICAL EXAM:  Flowsheet Row Most Recent Value  Orbital Region No depletion  Upper Arm Region No depletion  Thoracic and Lumbar Region No depletion  Buccal Region No depletion  Temple Region No depletion  Clavicle Bone Region No depletion  Clavicle and Acromion Bone Region No depletion  Scapular Bone Region No depletion  Dorsal Hand No depletion  Patellar Region No depletion  Anterior Thigh Region No depletion  Posterior Calf Region No depletion  Edema (RD Assessment) Moderate  Hair Reviewed  Eyes Reviewed  Mouth Reviewed  Skin Reviewed  Nails Reviewed       Diet Order:   Diet Order              Diet regular Fluid consistency: Thin  Diet effective now                   EDUCATION NEEDS:   No education needs have been identified at this time  Skin:  Skin Assessment: Skin Integrity Issues: Skin Integrity Issues:: Stage III Stage III: sacrum  Last BM:  Unknown  Height:   Ht Readings from Last 1 Encounters:  12/25/23 5\' 2"  (1.575 m)    Weight:   Wt Readings from Last 1 Encounters:  12/25/23 112.6 kg    Ideal Body Weight:  50 kg  BMI:  Body mass index is 45.4 kg/m.  Estimated Nutritional Needs:   Kcal:  1800-2000  Protein:  100-115 grams  Fluid:  1.8-2.0 L    Herschel Lords, RD, LDN, CDCES Registered Dietitian III Certified Diabetes Care and Education Specialist If unable to reach this RD, please use "RD Inpatient" group chat on secure chat between hours of 8am-4 pm daily

## 2023-12-28 NOTE — Progress Notes (Signed)
 1      PROGRESS NOTE    Kristina Cruz  ZOX:[id number] DOB: [birth year] DOA: 12/25/2023 PCP: Chucky Craver, FNP    Brief Narrative:   63 y.o. female with medical history significant of Stage V pancreatic cancer with liver metastasis on chemotherapy, uncontrolled type 2 diabetes, hypertension, COPD and asthma, morbid obesity, who presents to the ED due to weakness   5/10: Cardiology consult for rapid atrial flutter, palliative care consult for goals of care 5/11: PT and OT consultation, metoprolol for rate control,  5/12: Successful TEE/cardioversion, palliative care consult, nutritionist consult   Assessment & Plan:   Principal Problem:   Severe sepsis (HCC) Active Problems:   Gastroenteritis   Cavitary lesion of lung   Acute heart failure (HCC)   AKI (acute kidney injury) (HCC)   Pancreatic cancer metastasized to liver (HCC)   Type 2 diabetes mellitus (HCC)   Chronic obstructive pulmonary disease (COPD) (HCC)   Typical atrial flutter (HCC)   Atrial fibrillation with RVR (HCC)   Morbid obesity (HCC)   Pressure injury of skin   Persistent atrial fibrillation (HCC)  * Severe sepsis (HCC) Patient is presenting with severe tachycardia, hypotension, leukocytosis concern for sepsis, with suspected sources being either GI versus pulmonary.  She has been experiencing diarrhea/vomiting for 1 week and a new productive cough for 1 day. -No stool/BM while here.  No exact source of infection found yet - Telemetry monitoring - Blood cultures negative thus far - Continue cefepime, vancomycin , Flagyl - Normal lactic acid (2.0-> 1.8)   Atrial flutter with RVR Cardiology following - Echocardiogram shows EF of 55 to 60% - Status post adenosine on 5/10-> converted to A-fib - Continue digoxin and metoprolol for rate control.  Heart rate improving from 140-> 120s - Status post successful TEE guided cardioversion.  Plan for transitioning to Eliquis tomorrow morning.  Continue heparin  drip  for now.  Loss of taste and appetite Likely due to chemotherapy, will add nystatin swish and swallow Nutritionist consult.  Discussed with Dr. Yu/oncology  Lower extremity edema D-dimer is elevated.  lower extremity Dopplers neg for DVT.  Please note patient refused CT angio chest but, she couldn't lie flat.   Cavitary lesion of lung During evaluation for sepsis, a new cavitary lesion in the left lower lobe was discovered, measuring 3.5 x 2.6 cm.  Given new cough, this may be a possible source of her sepsis as she is immuno compromised in the setting of chemotherapy or may be new metastasis of her known stage IV pancreatic cancer.   - Antibiotics as noted above - Expectorated sputum culture, strep pneumo and Legionella urinary antigens   Gastroenteritis Patient presenting with 1 week of vomiting and intermittently melanotic diarrhea.  Potentially secondary to recent chemotherapy, however will need to evaluate for infectious etiology as well. No evidence of colitis on imaging   - GI panel ordered but no stool while here - Zofran  as needed   AKI (acute kidney injury) (HCC) Mild AKI in the setting of hypovolemia and sepsis.   Lab Results  Component Value Date   CREATININE 1.03 (H) 12/28/2023   CREATININE 0.92 12/27/2023   CREATININE 0.97 12/26/2023    - improved with IV hydration - Hold nephrotoxic agents - Repeat BMP in the a.m.   Pancreatic cancer metastasized to liver [facility]) History of recently diagnosed stage IV pancreatic cancer with metastasis to the liver on chemotherapy.She last received paclitaxel  and gemcitabine  on April 25.  Followed by Dr. Wilhelmenia Harada at  the cancer center Palliative care consult   Chronic obstructive pulmonary disease (COPD) (HCC) Patient has a history of mixed COPD/asthma with multiple recent exacerbations, however no wheezing on examination at this time and she notes her shortness of breath is at baseline.   -  DuoNebs every 6 hours - Continue home  bronchodilators - Pulmonary toilet   Type 2 diabetes mellitus (HCC) Last A1c of 8.7% approximately 4 months ago.  - A1c 10.9 on this admission  Goals of care Consult palliative care   DVT prophylaxis: Heparin  infusion      Code Status: Full code Family Communication: No family at bedside Disposition Plan: Possible discharge in 1-2 days depending on clinical condition, cardiology workup   Consultants:  Cardiology    Antimicrobials:  Cefepime Flagyl Vancomycin    Subjective:  Feeling tired.  Frustrated with loss of her taste and not having much appetite.  She does understand why she is having such issues.  Looking forward to get her heart rate stabilized  Objective: Vitals:   12/28/23 1335 12/28/23 1345 12/28/23 1400 12/28/23 1524  BP: (!) 92/59 (!) 96/59 (!) 81/70 (!) 94/59  Pulse: 69 69 70 74  Resp:   17 18  Temp:    97.6 F (36.4 C)  TempSrc:      SpO2: 98% 98% 95% 100%  Weight:      Height:        Intake/Output Summary (Last 24 hours) at 12/28/2023 1956 Last data filed at 12/28/2023 1336 Gross per 24 hour  Intake 1610.74 ml  Output --  Net 1610.74 ml   Filed Weights   12/25/23 0922  Weight: 112.6 kg    Examination:  General exam: Appears calm and comfortable  Respiratory system: Clear to auscultation. Respiratory effort normal. Cardiovascular system: S1 & S2 heard, tachycardic, no murmurs, 1+ lower extremity edema Gastrointestinal system: Abdomen is soft, benign Central nervous system: Alert and oriented. No focal neurological deficits. Extremities: Symmetric 5 x 5 power. Skin: No rashes, lesions or ulcers Psychiatry: Judgement and insight appear normal. Mood & affect appropriate.     Data Reviewed: I have personally reviewed following labs and imaging studies  CBC: Recent Labs  Lab 12/25/23 0816 12/25/23 0946 12/26/23 0300 12/27/23 0331 12/28/23 0500  WBC 24.4* 24.6* 25.5* 25.3* 25.2*  NEUTROABS 12.4* 15.7* 13.3*  --   --   HGB  10.9* 10.7* 10.0* 9.7* 9.3*  HCT 32.0* 31.3* 29.6* 28.7* 28.1*  MCV 81.2 81.5 81.8 82.9 83.4  PLT 189 164 167 153 186   Basic Metabolic Panel: Recent Labs  Lab 12/25/23 0816 12/25/23 0946 12/26/23 0300 12/27/23 0331 12/28/23 0500  NA 130* 131* 132* 135 137  K 3.0* 3.0* 3.9 3.8 3.8  CL 91* 93* 99 103 104  CO2 25 24 25 24 25   GLUCOSE 216* 219* 207* 134* 132*  BUN 28* 29* 21 16 13   CREATININE 1.33* 1.18* 0.97 0.92 1.03*  CALCIUM  8.0* 8.1* 7.7* 7.7* 7.8*  MG  --   --  2.1  --   --   PHOS  --   --  2.5  --   --    GFR: Estimated Creatinine Clearance: 64.6 mL/min (A) (by C-G formula based on SCr of 1.03 mg/dL (H)). Liver Function Tests: Recent Labs  Lab 12/25/23 0816 12/25/23 0946 12/26/23 0300 12/27/23 0331  AST 25 24 23 21   ALT 14 15 13 13   ALKPHOS 120 98 85 83  BILITOT 1.6* 1.5* 1.0 1.0  PROT 6.9 7.0  6.0* 5.9*  ALBUMIN  2.8* 2.7* 2.4* 2.3*   Recent Labs  Lab 12/25/23 0946  LIPASE 35   No results for input(s): "AMMONIA" in the last 168 hours. Coagulation Profile: Recent Labs  Lab 12/25/23 0945 12/26/23 1043  INR 1.2 1.3*   Cardiac Enzymes: No results for input(s): "CKTOTAL", "CKMB", "CKMBINDEX", "TROPONINI" in the last 168 hours. BNP (last 3 results) No results for input(s): "PROBNP" in the last 8760 hours. HbA1C: Recent Labs    12/25/23 2200  HGBA1C 10.9*   CBG: Recent Labs  Lab 12/27/23 1706 12/27/23 2103 12/28/23 0740 12/28/23 1120 12/28/23 1721  GLUCAP 125* 130* 127* 109* 169*   Lipid Profile: No results for input(s): "CHOL", "HDL", "LDLCALC", "TRIG", "CHOLHDL", "LDLDIRECT" in the last 72 hours. Thyroid Function Tests: No results for input(s): "TSH", "T4TOTAL", "FREET4", "T3FREE", "THYROIDAB" in the last 72 hours. Anemia Panel: No results for input(s): "VITAMINB12", "FOLATE", "FERRITIN", "TIBC", "IRON", "RETICCTPCT" in the last 72 hours. Sepsis Labs: Recent Labs  Lab 12/25/23 0945 12/25/23 1140 12/25/23 2235  LATICACIDVEN 1.6 2.0*  1.8    Recent Results (from the past 240 hours)  Culture, blood (Routine x 2)     Status: None (Preliminary result)   Collection Time: 12/25/23  9:45 AM   Specimen: BLOOD  Result Value Ref Range Status   Specimen Description BLOOD RIGHT ANTECUBITAL  Final   Special Requests   Final    BOTTLES DRAWN AEROBIC AND ANAEROBIC Blood Culture adequate volume   Culture   Final    NO GROWTH 3 DAYS Performed at [facility], [address] [place], [place] [address]    Report Status PENDING  Incomplete  Culture, blood (Routine x 2)     Status: None (Preliminary result)   Collection Time: 12/25/23  9:45 AM   Specimen: BLOOD  Result Value Ref Range Status   Specimen Description BLOOD BLOOD LEFT HAND  Final   Special Requests   Final    BOTTLES DRAWN AEROBIC AND ANAEROBIC Blood Culture results may not be optimal due to an inadequate volume of blood received in culture bottles   Culture   Final    NO GROWTH 3 DAYS Performed at [facility], [address] [place], [place] [address]    Report Status PENDING  Incomplete  Resp panel by RT-PCR (RSV, Flu A&B, Covid) Anterior Nasal Swab     Status: None   Collection Time: 12/25/23  9:46 AM   Specimen: Anterior Nasal Swab  Result Value Ref Range Status   SARS Coronavirus 2 by RT PCR NEGATIVE NEGATIVE Final    Comment: (NOTE) SARS-CoV-2 target nucleic acids are NOT DETECTED.  The SARS-CoV-2 RNA is generally detectable in upper respiratory specimens during the acute phase of infection. The lowest concentration of SARS-CoV-2 viral copies this assay can detect is 138 copies/mL. A negative result does not preclude SARS-Cov-2 infection and should not be used as the sole basis for treatment or other patient management decisions. A negative result may occur with  improper specimen collection/handling, submission of specimen other than nasopharyngeal swab, presence of viral mutation(s) within the areas targeted by this assay, and  inadequate number of viral copies(<138 copies/mL). A negative result must be combined with clinical observations, patient history, and epidemiological information. The expected result is Negative.  Fact Sheet for Patients:  BloggerCourse.com  Fact Sheet for Healthcare Providers:  SeriousBroker.it  This test is no t yet approved or cleared by the [place]  FDA and  has been authorized for  detection and/or diagnosis of SARS-CoV-2 by FDA under an Emergency Use Authorization (EUA). This EUA will remain  in effect (meaning this test can be used) for the duration of the COVID-19 declaration under Section 564(b)(1) of the Act, 21 U.S.C.section 360bbb-3(b)(1), unless the authorization is terminated  or revoked sooner.       Influenza A by PCR NEGATIVE NEGATIVE Final   Influenza B by PCR NEGATIVE NEGATIVE Final    Comment: (NOTE) The Xpert Xpress SARS-CoV-2/FLU/RSV plus assay is intended as an aid in the diagnosis of influenza from Nasopharyngeal swab specimens and should not be used as a sole basis for treatment. Nasal washings and aspirates are unacceptable for Xpert Xpress SARS-CoV-2/FLU/RSV testing.  Fact Sheet for Patients: BloggerCourse.com  Fact Sheet for Healthcare Providers: SeriousBroker.it  This test is not yet approved or cleared by the [place]  FDA and has been authorized for detection and/or diagnosis of SARS-CoV-2 by FDA under an Emergency Use Authorization (EUA). This EUA will remain in effect (meaning this test can be used) for the duration of the COVID-19 declaration under Section 564(b)(1) of the Act, 21 U.S.C. section 360bbb-3(b)(1), unless the authorization is terminated or revoked.     Resp Syncytial Virus by PCR NEGATIVE NEGATIVE Final    Comment: (NOTE) Fact Sheet for Patients: BloggerCourse.com  Fact Sheet for Healthcare  Providers: SeriousBroker.it  This test is not yet approved or cleared by the [place]  FDA and has been authorized for detection and/or diagnosis of SARS-CoV-2 by FDA under an Emergency Use Authorization (EUA). This EUA will remain in effect (meaning this test can be used) for the duration of the COVID-19 declaration under Section 564(b)(1) of the Act, 21 U.S.C. section 360bbb-3(b)(1), unless the authorization is terminated or revoked.  Performed at [facility], [address] [place], [place] [address]          Radiology Studies: ECHO TEE Result Date: 12/28/2023    TRANSESOPHOGEAL ECHO REPORT   Patient Name:   Kaitlynn Ruppe Date of Exam: 12/28/2023 Medical Rec #:  [id number]          Height:       62.0 in Accession #:    [id number]         Weight:       248.2 lb Date of Birth:  20-Aug-1960          BSA:          2.096 m Patient Age:    63 years           BP:           83/54 mmHg Patient Gender: F                  HR:           85 bpm. Exam Location:  [facility] Procedure: Transesophageal Echo, Color Doppler and Cardiac Doppler (Both            Spectral and Color Flow Doppler were utilized during procedure). Indications:     Atrial Fibrillation I48.91  History:         Patient has prior history of Echocardiogram examinations, most                  recent 12/26/2023. Risk Factors:Diabetes and Hypertension. GERD.  Sonographer:     Broadus Canes Referring Phys:  [id number] BERGE Diagnosing Phys: Constancia Delton MD PROCEDURE: The transesophogeal probe was passed without difficulty through the esophogus of the patient. Sedation performed  by different physician. The patient developed no complications during the procedure.  IMPRESSIONS  1. Left ventricular ejection fraction, by estimation, is 55 to 60%. The left ventricle has normal function.  2. Right ventricular systolic function is normal. The right ventricular size is normal.  3. No left atrial/left  atrial appendage thrombus was detected.  4. The mitral valve is normal in structure. Trivial mitral valve regurgitation.  5. Tricuspid valve regurgitation is mild to moderate.  6. The aortic valve is tricuspid. Aortic valve regurgitation is not visualized. FINDINGS  Left Ventricle: Left ventricular ejection fraction, by estimation, is 55 to 60%. The left ventricle has normal function. The left ventricular internal cavity size was normal in size. Right Ventricle: The right ventricular size is normal. No increase in right ventricular wall thickness. Right ventricular systolic function is normal. Left Atrium: Left atrial size was normal in size. No left atrial/left atrial appendage thrombus was detected. Right Atrium: Right atrial size was normal in size. Pericardium: There is no evidence of pericardial effusion. Mitral Valve: The mitral valve is normal in structure. Trivial mitral valve regurgitation. Tricuspid Valve: The tricuspid valve is normal in structure. Tricuspid valve regurgitation is mild to moderate. Aortic Valve: The aortic valve is tricuspid. Aortic valve regurgitation is not visualized. Pulmonic Valve: The pulmonic valve was grossly normal. Pulmonic valve regurgitation is trivial. Aorta: The aortic root is normal in size and structure. IAS/Shunts: No atrial level shunt detected by color flow Doppler. Constancia Delton MD Electronically signed by Constancia Delton MD Signature Date/Time: 12/28/2023/4:39:22 PM    Final         Scheduled Meds:  (feeding supplement) PROSource Plus  30 mL Oral TID BM   vitamin C  500 mg Oral BID   Chlorhexidine  Gluconate Cloth  6 each Topical Q0600   digoxin  0.125 mg Oral Daily   feeding supplement  237 mL Oral TID BM   fluticasone  furoate-vilanterol  1 puff Inhalation Daily   insulin  aspart  0-20 Units Subcutaneous TID WC   leptospermum manuka honey  1 Application Topical Daily   metoprolol tartrate  12.5 mg Oral Q6H   multivitamin with minerals  1 tablet  Oral Daily   nystatin  5 mL Mouth/Throat QID   rOPINIRole   2 mg Oral BID   sodium chloride  flush  3 mL Intravenous Q12H   zinc sulfate (50mg  elemental zinc)  220 mg Oral Daily   Continuous Infusions:  ceFEPime (MAXIPIME) IV 2 g (12/28/23 1802)   heparin  1,000 Units/hr (12/28/23 1655)   metronidazole 500 mg (12/28/23 0943)   vancomycin  1,250 mg (12/28/23 1153)     LOS: 3 days    Time spent: 35 minutes    Xuan Mateus Mason Sole, MD [facility] Pager [phone]  If 7PM-7AM, please contact night-coverage [email or web address]  12/28/2023, 7:56 PM

## 2023-12-28 NOTE — Consult Note (Signed)
 PHARMACY CONSULT NOTE - ELECTROLYTES  Pharmacy Consult for Electrolyte Monitoring and Replacement   Recent Labs: Height: 5\' 2"  (157.5 cm) Weight: 112.6 kg (248 lb 3.8 oz) IBW/kg (Calculated) : 50.1 Estimated Creatinine Clearance: 64.6 mL/min (A) (by C-G formula based on SCr of 1.03 mg/dL (H)). Potassium (mmol/L)  Date Value  12/28/2023 3.8  04/10/2014 3.4 (L)   Magnesium  (mg/dL)  Date Value  09/81/1914 2.1   Calcium  (mg/dL)  Date Value  78/29/5621 7.8 (L)   Calcium , Total (mg/dL)  Date Value  30/86/5784 8.8   Albumin  (g/dL)  Date Value  69/62/9528 2.3 (L)  04/10/2014 3.1 (L)   Phosphorus (mg/dL)  Date Value  41/32/4401 2.5   Sodium (mmol/L)  Date Value  12/28/2023 137  04/10/2014 143    Assessment  Kristina Cruz is a 63 y.o. female presenting with weakness. PMH significant for stage 5 pancreatic cancer with liver metastasis on chemotherapy, T2DM, HTN, COPD, asthma. Pharmacy has been consulted to monitor and replace electrolytes.  Diet: NPO MIVF: N/A Pertinent medications: N/A  Goal of Therapy: Electrolytes WNL  Plan:  No replacement currently warranted Check BMP, Mg, Phos with AM labs  Thank you for allowing pharmacy to be a part of this patient's care.  Kaydon Creedon Rodriguez-Guzman PharmD, BCPS 12/28/2023 7:42 AM

## 2023-12-28 NOTE — Consult Note (Signed)
 PHARMACY - ANTICOAGULATION CONSULT NOTE  Pharmacy Consult for Heparin  Indication: Atrial Flutter   Allergies  Allergen Reactions   Peanut-Containing Drug Products    Patient Measurements: Height: 5\' 2"  (157.5 cm) Weight: 112.6 kg (248 lb 3.8 oz) IBW/kg (Calculated) : 50.1 HEPARIN  DW (KG): 77.6  Vital Signs: Temp: 97.8 F (36.6 C) (05/12 0427) BP: 126/70 (05/12 0427) Pulse Rate: 116 (05/12 0427)  Labs: Recent Labs    12/25/23 0816 12/25/23 0945 12/25/23 0946 12/25/23 1140 12/26/23 0300 12/26/23 1043 12/26/23 1830 12/27/23 0331 12/27/23 1150 12/27/23 1747 12/28/23 0500  HGB   < >  --  10.7*  --  10.0*  --   --  9.7*  --   --  9.3*  HCT  --   --  31.3*  --  29.6*  --   --  28.7*  --   --  28.1*  PLT   < >  --  164  --  167  --   --  153  --   --  186  APTT  --   --   --   --   --  40*  --   --   --   --   --   LABPROT  --  15.8*  --   --   --  16.8*  --   --   --   --   --   INR  --  1.2  --   --   --  1.3*  --   --   --   --   --   HEPARINUNFRC  --   --   --   --   --   --    < > 0.29* 0.34 0.48 0.47  CREATININE   < >  --  1.18*  --  0.97  --   --  0.92  --   --  1.03*  TROPONINIHS  --   --  12 14  --   --   --   --   --   --   --    < > = values in this interval not displayed.   Estimated Creatinine Clearance: 64.6 mL/min (A) (by C-G formula based on SCr of 1.03 mg/dL (H)).  Medical History: Past Medical History:  Diagnosis Date   Anemia    Asthma    Back pain    Cataract    Diabetes mellitus without complication (HCC)    GERD (gastroesophageal reflux disease)    History of echocardiogram    Hypertension    Morbid obesity with BMI of 60.0-69.9, adult (HCC)    Restless leg syndrome    Medications:  No PTA anticoagulation  DVT prophylactic LMWH given 5/9 @ 2013   Assessment: 63 year old female found to be in atrial flutter with RVR. From chart review, patient was not on any anticoagulation prior to admission. Pharmacy has been consulted for initiation  and management of a heparin  infusion. Baseline labs: Hgb 10.0, PLT 167, aPTT & INR have been ordered.   Goal of Therapy:  Heparin  level 0.3-0.7 units/ml Monitor platelets by anticoagulation protocol: Yes  0510 @ 1830: HL >1.10 = Supratherapeutic 05/11 0331 HL 0.29, slightly subtherapeutic 05/11 1150 HL 0.34, therapeutic  05/11 1747 HL 0.48, therapeutic x 2 05/12 0500 HL 0.47, therapeutic x 3    Plan: HL therapeutic x 3 Continue heparin  infusion at 1000 units/hr  CBC and HL daily while on heparin  infusion F/u  transition to PO anticoagulation if indicated  Coretta Dexter, PharmD, [employer] 12/28/2023 5:49 AM

## 2023-12-28 NOTE — Procedures (Signed)
 Transesophageal Echocardiogram  and DC cardioversion:  Indication: atrial fibrillation  Time Out: Verified patient identification, verified procedure, site/side was marked, verified correct patient position, special equipment/implants available, medications/allergies/relevent history reviewed, required imaging and test results available.  Performed  Procedure: 10 ml of viscous lidocaine  were given orally to provide local anesthesia to the oropharynx. The patient was positioned supine on the left side, bite block provided. The patient was sedated per anesthesia team.  Using digital technique an omniplane probe was advanced into the esophagus without incident.   See report in EPIC  for complete details: In brief, imaging revealed normal LV function with no RWMAs and no mural apical thrombus.  .  Estimated ejection fraction was 55-60%.  Right sided cardiac chambers were normal with no evidence of pulmonary hypertension.  Imaging of the septum showed no ASD or VSD 2D and color flow confirmed no PFO  The LA was well visualized in orthogonal views.  There was no thrombus in the LA and LA appendage   Cardioversion procedure note  Patient placed on cardiac monitor, pulse oximetry, supplemental oxygen as necessary.   Sedation given: propofol  IV per anesthesia team  Pacer pads placed anterior and posterior chest.  Cardioverted 1 time(s).   Cardioverted at  200J. Synchronized biphasic Converted to NSR  Evaluation: Findings: Post procedure EKG shows: NSR Complications: None Patient did tolerate procedure well.  Constancia Delton, M.D.    Polly Brink Agbor-Etang 12/28/2023 1:25 PM

## 2023-12-28 NOTE — Anesthesia Preprocedure Evaluation (Signed)
 Anesthesia Evaluation  Patient identified by MRN, date of birth, ID band Patient awake    Reviewed: Allergy & Precautions, H&P , NPO status , Patient's Chart, lab work & pertinent test results  History of Anesthesia Complications Negative for: history of anesthetic complications  Airway Mallampati: III  TM Distance: >3 FB     Dental   Pulmonary asthma , neg sleep apnea, neg COPD   breath sounds clear to auscultation       Cardiovascular hypertension, (-) angina (-) Past MI and (-) Cardiac Stents + dysrhythmias Atrial Fibrillation  Rhythm:regular Rate:Normal     Neuro/Psych negative neurological ROS  negative psych ROS   GI/Hepatic Neg liver ROS,GERD  ,,  Endo/Other  diabetes  Class 3 obesity (super morbid obesity, BMI 65)  Renal/GU Renal disease     Musculoskeletal   Abdominal   Peds  Hematology  (+) Blood dyscrasia, anemia   Anesthesia Other Findings Past Medical History: No date: Anemia No date: Asthma No date: Back pain No date: Cataract No date: Diabetes mellitus without complication (HCC) No date: GERD (gastroesophageal reflux disease) No date: Hypertension No date: Morbid obesity with BMI of 60.0-69.9, adult (HCC) No date: Restless leg syndrome  Past Surgical History: No date: ABDOMINAL HYSTERECTOMY 07/14/2017: HYSTEROSCOPY WITH D & C; N/A     Comment:  Procedure: DILATATION AND CURETTAGE /HYSTEROSCOPY;                Surgeon: Alben Alma, MD;  Location: [facility] ORS;                Service: Gynecology;  Laterality: N/A; 2015: POLYPECTOMY  BMI    Body Mass Index: 64.84 kg/m      Reproductive/Obstetrics negative OB ROS                             Anesthesia Physical Anesthesia Plan  ASA: 3  Anesthesia Plan: General   Post-op Pain Management: Minimal or no pain anticipated   Induction: Intravenous  PONV Risk Score and Plan: 3 and Propofol  infusion, TIVA and  Ondansetron   Airway Management Planned: Nasal Cannula  Additional Equipment: None  Intra-op Plan:   Post-operative Plan:   Informed Consent: I have reviewed the patients History and Physical, chart, labs and discussed the procedure including the risks, benefits and alternatives for the proposed anesthesia with the patient or authorized representative who has indicated his/her understanding and acceptance.     Dental advisory given  Plan Discussed with: CRNA and Surgeon  Anesthesia Plan Comments: (Discussed risks of anesthesia with patient, including possibility of difficulty with spontaneous ventilation under anesthesia necessitating airway intervention, PONV, and rare risks such as cardiac or respiratory or neurological events, and allergic reactions. Discussed the role of CRNA in patient's perioperative care. Patient understands.)       Anesthesia Quick Evaluation

## 2023-12-28 NOTE — Anesthesia Procedure Notes (Signed)
 Procedure Name: MAC Date/Time: 12/28/2023 1:01 PM  Performed by: Marisue Side, CRNAPre-anesthesia Checklist: Patient identified, Emergency Drugs available, Suction available and Patient being monitored Patient Re-evaluated:Patient Re-evaluated prior to induction Oxygen Delivery Method: Simple face mask Preoxygenation: Pre-oxygenation with 100% oxygen Induction Type: IV induction Comments: pom

## 2023-12-28 NOTE — Plan of Care (Signed)

## 2023-12-28 NOTE — Transfer of Care (Signed)
 Immediate Anesthesia Transfer of Care Note  Patient: Kristina Cruz  Procedure(s) Performed: ECHOCARDIOGRAM, TRANSESOPHAGEAL CARDIOVERSION  Patient Location: Short Stay  Anesthesia Type:General  Level of Consciousness: drowsy and patient cooperative  Airway & Oxygen Therapy: Patient Spontanous Breathing and Patient connected to face mask oxygen  Post-op Assessment: Report given to RN and Post -op Vital signs reviewed and stable  Post vital signs: Reviewed and stable  Last Vitals:  Vitals Value Taken Time  BP 91/59 12/28/23 1334  Temp    Pulse 65 12/28/23 1335  Resp 18 12/28/23 1335  SpO2 99 % 12/28/23 1335  Vitals shown include unfiled device data.  Last Pain:  Vitals:   12/28/23 1214  TempSrc: Axillary  PainSc: 0-No pain      Patients Stated Pain Goal: 0 (12/27/23 2000)  Complications: No notable events documented.

## 2023-12-28 NOTE — Progress Notes (Signed)
 PT Cancellation Note  Patient Details Name: Kristina Cruz MRN: [id number] DOB: Apr 01, 1961   Cancelled Treatment:    Reason Eval/Treat Not Completed: Patient declined, no reason specified.  PT consult received.  Chart reviewed.  Pt declining therapy at this time.  Pt NPO for procedure later today.  Will re-attempt PT session at a later date/time.  Amador Junes, PT 12/28/23, 9:04 AM

## 2023-12-28 NOTE — Progress Notes (Signed)
 OT Cancellation Note  Patient Details Name: Kristina Cruz MRN: [id number] DOB: [birth year]   Cancelled Treatment:    Reason Eval/Treat Not Completed: Patient declined, no reason specified. Discussed with PT - pt declining therapy at this time. OT will check back as able and when medically appropriate (noted pt to have TEE at some point today).     Semir Brill L. Antrone Walla, OTR/L  12/28/23, 9:04 AM

## 2023-12-28 NOTE — Progress Notes (Signed)
 Rounding Note    Patient Name: Kristina Cruz Date of Encounter: 12/28/2023  [facility] Cardiologist: Timothy Gollan, MD   Subjective   At time of my exam patient remained in atrial fibrillation with rate 80-110s bpm on average, up to 140s at times. She is scheduled for TEE/DCCV this afternoon. She is asymptomatic from a cardiac perspective.   Inpatient Medications    Scheduled Meds:  Chlorhexidine  Gluconate Cloth  6 each Topical Q0600   digoxin  0.125 mg Oral Daily   fluticasone  furoate-vilanterol  1 puff Inhalation Daily   gabapentin   100 mg Oral TID   insulin  aspart  0-20 Units Subcutaneous TID WC   leptospermum manuka honey  1 Application Topical Daily   metoprolol tartrate  12.5 mg Oral Q6H   nystatin  5 mL Mouth/Throat QID   rOPINIRole   2 mg Oral BID   sodium chloride  flush  3 mL Intravenous Q12H   Continuous Infusions:  ceFEPime (MAXIPIME) IV 2 g (12/28/23 0259)   heparin  1,000 Units/hr (12/28/23 0400)   metronidazole 500 mg (12/28/23 0943)   vancomycin  1,250 mg (12/27/23 1219)   PRN Meds: acetaminophen  **OR** acetaminophen , ipratropium-albuterol , morphine , ondansetron  **OR** ondansetron  (ZOFRAN ) IV, oxyCODONE    Vital Signs    Vitals:   12/27/23 2107 12/27/23 2343 12/28/23 0427 12/28/23 0739  BP: (!) 91/59 (!) 83/56 126/70 100/60  Pulse: (!) 114 89 (!) 116 84  Resp: 20 18 20 20   Temp: 98.5 F (36.9 C) 98.2 F (36.8 C) 97.8 F (36.6 C) 98.5 F (36.9 C)  TempSrc:    Oral  SpO2: 100% 96% 100% 95%  Weight:      Height:        Intake/Output Summary (Last 24 hours) at 12/28/2023 1033 Last data filed at 12/28/2023 0400 Gross per 24 hour  Intake 1630.74 ml  Output --  Net 1630.74 ml      12/25/2023    9:22 AM 12/25/2023    8:42 AM 12/17/2023   10:30 AM  Last 3 Weights  Weight (lbs) 248 lb 3.8 oz -- 258 lb 9.6 oz  Weight (kg) 112.6 kg -- 117.3 kg      Telemetry    Atrial fibrillation with rate 80-110 bpm on average, up to 140s bpm at  times - Personally Reviewed  Physical Exam   GEN: No acute distress.   Neck: No JVD Cardiac: IRIR, no murmurs, rubs, or gallops.  Respiratory: Clear to auscultation bilaterally. GI: Soft, nontender, non-distended  MS: No edema; No deformity. Neuro:  Nonfocal  Psych: Normal affect   Labs    High Sensitivity Troponin:   Recent Labs  Lab 12/25/23 0946 12/25/23 1140  TROPONINIHS 12 14     Chemistry Recent Labs  Lab 12/25/23 0946 12/26/23 0300 12/27/23 0331 12/28/23 0500  NA 131* 132* 135 137  K 3.0* 3.9 3.8 3.8  CL 93* 99 103 104  CO2 24 25 24 25   GLUCOSE 219* 207* 134* 132*  BUN 29* 21 16 13   CREATININE 1.18* 0.97 0.92 1.03*  CALCIUM  8.1* 7.7* 7.7* 7.8*  MG  --  2.1  --   --   PROT 7.0 6.0* 5.9*  --   ALBUMIN  2.7* 2.4* 2.3*  --   AST 24 23 21   --   ALT 15 13 13   --   ALKPHOS 98 85 83  --   BILITOT 1.5* 1.0 1.0  --   GFRNONAA 51* >60 >60 >60  ANIONGAP 14 8 8  8  Lipids No results for input(s): "CHOL", "TRIG", "HDL", "LABVLDL", "LDLCALC", "CHOLHDL" in the last 168 hours.  Hematology Recent Labs  Lab 12/26/23 0300 12/27/23 0331 12/28/23 0500  WBC 25.5* 25.3* 25.2*  RBC 3.62* 3.46* 3.37*  HGB 10.0* 9.7* 9.3*  HCT 29.6* 28.7* 28.1*  MCV 81.8 82.9 83.4  MCH 27.6 28.0 27.6  MCHC 33.8 33.8 33.1  RDW 15.3 15.7* 15.8*  PLT 167 153 186   Thyroid No results for input(s): "TSH", "FREET4" in the last 168 hours.  BNP Recent Labs  Lab 12/25/23 0946  BNP 109.6*    DDimer  Recent Labs  Lab 12/26/23 0320  DDIMER 3.50*    Cardiac Studies   12/26/2023 Echo complete  1. Left ventricular ejection fraction, by estimation, is 55 to 60%. Left  ventricular ejection fraction by PLAX is 51 %. The left ventricle has  normal function. The left ventricle has no regional wall motion  abnormalities. There is moderate left  ventricular hypertrophy. Left ventricular diastolic parameters are  indeterminate.   2. Right ventricular systolic function is mildly reduced. The  right  ventricular size is normal. Tricuspid regurgitation signal is inadequate  for assessing PA pressure.   3. The mitral valve is normal in structure. No evidence of mitral valve  regurgitation. No evidence of mitral stenosis.   4. The aortic valve is normal in structure. Aortic valve regurgitation is  not visualized. No aortic stenosis is present.   5. The inferior vena cava is normal in size with greater than 50%  respiratory variability, suggesting right atrial pressure of 3 mmHg.   Patient Profile     63 y.o. female with a history of stage V pancreatic cancer w/ liver mets on weekly chemoRx, DMII, HTN, obesity, asthma, and COPD, who was admitted w/ rapid atrial flutter, diarrhea, and sepsis.   Assessment & Plan    Atrial fibrillation/flutter with RVR - Admitted with 2-week history of progressive diarrhea, anorexia, and malaise.  Found to be tachycardic at oncology visit 5/9, prompting admission.  Wide-complex tachycardia in the setting of known RBBB, rates elevated 140s. - S/p adenosine 6 mg IV x 1 with evidence of flutter waves followed by A-fib.  Also received IV digoxin X215/9. - Remains in atrial fibrillation with rates on average 80-110 bpm, up to 140s at times - Continue metoprolol 12.5 mg every 6 hours and digoxin 0.125 mg daily - Will check dig level tomorrow - Tentatively scheduled for TEE/DCCV this afternoon -Continue IV heparin  with plan to transition to Eliquis following cardioversion  Sepsis Gastroenteritis Cavitary lung lesion - No further diarrhea - Antibiotics/IV fluids per IM  AKI - In the setting of above, resolved  Lower extremity edema - Stable in the setting of hypoalbuminemia, poor appetite, and diarrhea - Normal LV function by echo 5/10 - Recommended compression stockings - Avoid diuretics at this time secondary to AKI  T2DM - A1c 10.9 - Management per IM  Metastatic pancreatic cancer - Followed by heme/oncology weekly outpatient chemo -  Imaging this admission showed progression  For questions or updates, please contact [facility] Please consult [email or web address] for contact info under        Signed, Brodie Cannon, PA-C  12/28/2023, 10:33 AM

## 2023-12-28 NOTE — Progress Notes (Signed)
 Palliative Care Progress Note, Assessment & Plan   Patient Name: Kristina Cruz       Date: 12/28/2023 DOB: 15-Apr-1961  Age: 63 y.o. MRN#: [id number] Attending Physician: Brenna Cam, MD Primary Care Physician: Chucky Craver, FNP Admit Date: 12/25/2023  Subjective: Pt resting in bed. Continues to have nausea and mild abdominal pain. She is not allowed to eat today pending "heart procedure" today. Denies CP or SOB.   HPI: 63 y.o. female with past medical history significant for stage IV pancreatic cancer with metastasis to liver on chemotherapy, anemia, uncontrolled DM II, HTN, COPD, asthma and morbid obesity. She presented to ED from the cancer center c/o N/V/D x 1 week, weakness, poor PO intake, productive cough and bilateral LE edema. Blood work at cancer center showed WBC 24.    ED workup found patient to be normotensive at 107/76 but subsequently became hypotensive with VS 77/66, HR 150, 96% RA, RR 24 and 97.6.  ED labs showed WBC 24.6, Hgb 10.7, K+ 3.0, glucose 219, creatinine 1.18, GFR 51, BNP 109, troponin 12->14, lactic 1.6->2.0.  CT AP revealed new cavitary lesion in left lower lobe measuring 2.4 x 2.8 cm concerning for infection vs. metastasis as well as known pancreatic and liver malignancy.    She was admitted to [facility] for further evaluation and management of severe sepsis, acute heart failure, gastroenteritis and new cavitary lung lesion    Palliative consulted for goals of care discussion. She is currently followed by oncology and outpatient palliative, Lilian Register, NP, at the cancer center.   Summary of counseling/coordination of care: Extensive chart review completed prior to meeting patient including labs, vital signs, imaging, progress notes, orders, and available advanced directive  documents from current and previous encounters.   After reviewing the patient's chart and assessing the patient at bedside, I spoke with patient in regards to symptom management and goals of care.   Ill-appearing female resting in bed. No family at bedside. She is A&O, calm and pleasant. Even, unlabored respirations. She is in no distress.   Notified patient per our discussion yesterday, DNR has been requested to be removed from her chart. Dicussed once she is discharged, she will follow up with Josh Borders in the cancer center. Patient is very specific that she wants the little pill that dissolves, 4 mg, not 8 mg, for nausea. Showed empty medication cup with Nystatin label, that she was given to drink that is supposed to help with her taste/taste buds. Discussed there are also medications that can be given to help improve her appetite. Patient requests that I share this information with Josh at the cancer center so he will know what to prescribe.  Discussed patient requests with Lilian Register, NP, via [employer].   Therapeutic silence and active listening provided for patient to share her thoughts and emotions regarding current medical situation.  Emotional support provided.  Physical Exam Vitals reviewed.  Constitutional:      General: She is not in acute distress.    Appearance: She is obese. She is ill-appearing.  HENT:     Head: Normocephalic and atraumatic.     Mouth/Throat:     Mouth: Mucous membranes are dry.  Pulmonary:  Effort: Pulmonary effort is normal. No respiratory distress.  Skin:    General: Skin is warm and dry.  Neurological:     Mental Status: She is alert and oriented to person, place, and time.  Psychiatric:        Mood and Affect: Mood normal.        Behavior: Behavior normal.        Thought Content: Thought content normal.        Judgment: Judgment normal.    Recommendations/Plan: FULL CODE status as previously documented    Continue current supportive  interventions Patient to f/u with OP palliative provider, Lilian Register, NP       Total Time 35 minutes   Time spent includes: Detailed review of medical records (labs, imaging, vital signs), medically appropriate exam (mental status, respiratory, cardiac, skin), discussed with treatment team, counseling and educating patient, family and staff, documenting clinical information, medication management and coordination of care.     Ina Manas, Joyice Nodal- [facility] Palliative Medicine Team  12/28/2023 11:43 AM  Office [phone]  Pager [phone]

## 2023-12-29 ENCOUNTER — Encounter: Payer: Self-pay | Admitting: Cardiology

## 2023-12-29 ENCOUNTER — Inpatient Hospital Stay

## 2023-12-29 ENCOUNTER — Encounter: Payer: Self-pay | Admitting: Oncology

## 2023-12-29 ENCOUNTER — Other Ambulatory Visit (HOSPITAL_COMMUNITY): Payer: Self-pay

## 2023-12-29 ENCOUNTER — Telehealth (HOSPITAL_COMMUNITY): Payer: Self-pay | Admitting: Pharmacy Technician

## 2023-12-29 DIAGNOSIS — R6520 Severe sepsis without septic shock: Secondary | ICD-10-CM | POA: Diagnosis not present

## 2023-12-29 DIAGNOSIS — J449 Chronic obstructive pulmonary disease, unspecified: Secondary | ICD-10-CM | POA: Diagnosis not present

## 2023-12-29 DIAGNOSIS — I4819 Other persistent atrial fibrillation: Secondary | ICD-10-CM | POA: Diagnosis not present

## 2023-12-29 DIAGNOSIS — A419 Sepsis, unspecified organism: Secondary | ICD-10-CM | POA: Diagnosis not present

## 2023-12-29 DIAGNOSIS — I483 Typical atrial flutter: Secondary | ICD-10-CM | POA: Diagnosis not present

## 2023-12-29 DIAGNOSIS — I509 Heart failure, unspecified: Secondary | ICD-10-CM | POA: Diagnosis not present

## 2023-12-29 LAB — THYROID PANEL WITH TSH
Free Thyroxine Index: 3.6 (ref 1.2–4.9)
T3 Uptake Ratio: 34 % (ref 24–39)
T4, Total: 10.5 ug/dL (ref 4.5–12.0)
TSH: 1.9 u[IU]/mL (ref 0.450–4.500)

## 2023-12-29 LAB — CBC
HCT: 26.2 % — ABNORMAL LOW (ref 36.0–46.0)
Hemoglobin: 8.6 g/dL — ABNORMAL LOW (ref 12.0–15.0)
MCH: 28 pg (ref 26.0–34.0)
MCHC: 32.8 g/dL (ref 30.0–36.0)
MCV: 85.3 fL (ref 80.0–100.0)
Platelets: 203 10*3/uL (ref 150–400)
RBC: 3.07 MIL/uL — ABNORMAL LOW (ref 3.87–5.11)
RDW: 16 % — ABNORMAL HIGH (ref 11.5–15.5)
WBC: 22.8 10*3/uL — ABNORMAL HIGH (ref 4.0–10.5)
nRBC: 4.3 % — ABNORMAL HIGH (ref 0.0–0.2)

## 2023-12-29 LAB — BASIC METABOLIC PANEL WITH GFR
Anion gap: 9 (ref 5–15)
BUN: 13 mg/dL (ref 8–23)
CO2: 24 mmol/L (ref 22–32)
Calcium: 7.9 mg/dL — ABNORMAL LOW (ref 8.9–10.3)
Chloride: 105 mmol/L (ref 98–111)
Creatinine, Ser: 0.95 mg/dL (ref 0.44–1.00)
GFR, Estimated: >60 mL/min (ref >60)
Glucose, Bld: 164 mg/dL — ABNORMAL HIGH (ref 70–99)
Potassium: 3.9 mmol/L (ref 3.5–5.1)
Sodium: 138 mmol/L (ref 135–145)

## 2023-12-29 LAB — GLUCOSE, CAPILLARY
Glucose-Capillary: 140 mg/dL — ABNORMAL HIGH (ref 70–99)
Glucose-Capillary: 98 mg/dL (ref 70–99)
Glucose-Capillary: 143 mg/dL — ABNORMAL HIGH (ref 70–99)
Glucose-Capillary: 100 mg/dL — ABNORMAL HIGH (ref 70–99)

## 2023-12-29 LAB — HEPARIN LEVEL (UNFRACTIONATED): Heparin Unfractionated: 0.38 [IU]/mL (ref 0.30–0.70)

## 2023-12-29 LAB — DIGOXIN LEVEL: Digoxin Level: 0.6 ng/mL — ABNORMAL LOW (ref 0.8–2.0)

## 2023-12-29 MED ORDER — ONDANSETRON 4 MG PO TBDP: 4.0000 mg | ORAL_TABLET | Freq: Three times a day (TID) | ORAL | 0 refills | Status: DC | PRN

## 2023-12-29 MED ORDER — APIXABAN 5 MG PO TABS
5.0000 mg | ORAL_TABLET | Freq: Two times a day (BID) | ORAL | Status: DC
  Administered 2023-12-29 – 2023-12-30 (×3): 5 mg via ORAL
  Filled 2023-12-29 (×3): qty 1

## 2023-12-29 MED ORDER — GUAIFENESIN-DM 100-10 MG/5ML PO SYRP
5.0000 mL | ORAL_SOLUTION | ORAL | Status: DC | PRN
  Administered 2023-12-29: 5 mL via ORAL
  Filled 2023-12-29: qty 10

## 2023-12-29 MED ORDER — VANCOMYCIN HCL 1250 MG/250ML IV SOLN
1250.0000 mg | INTRAVENOUS | Status: AC
  Administered 2023-12-29: 1250 mg via INTRAVENOUS
  Filled 2023-12-29: qty 250

## 2023-12-29 MED ORDER — OLANZAPINE 5 MG PO TABS
5.0000 mg | ORAL_TABLET | Freq: Every day | ORAL | Status: DC
  Administered 2023-12-29: 5 mg via ORAL
  Filled 2023-12-29: qty 1

## 2023-12-29 MED ORDER — ORAL CARE MOUTH RINSE: 15.0000 mL | OROMUCOSAL | Status: DC | PRN

## 2023-12-29 MED ORDER — METOPROLOL TARTRATE 25 MG PO TABS
25.0000 mg | ORAL_TABLET | Freq: Two times a day (BID) | ORAL | Status: DC
  Administered 2023-12-29 – 2023-12-30 (×2): 25 mg via ORAL
  Filled 2023-12-29 (×2): qty 1

## 2023-12-29 NOTE — Telephone Encounter (Signed)

## 2023-12-29 NOTE — Evaluation (Signed)
 Occupational Therapy Evaluation Patient Details Name: Kristina Cruz MRN: [id number] DOB: 11-29-1960 Today's Date: 12/29/2023   History of Present Illness   Pt is a 63 y.o. female presenting to hospital 12/25/23 with c/o generalized weakness associated with N/V/D and lightheadedness; also SOB and swelling B LE's; sent over from [facility] center today d/t elevated WBC count.  Pt admitted with severe sepsis, acute heart failure, cavitary lesion of lung, gastroenteritis, AKI, pancreatic CA metastasized to liver; pt also with atrial flutter with RVR.  PMH includes anemia, asthma, DM, gastric reflux, htn, morbid obesity, LE edema, RLS, stage IV pancreatic CA on chemo with liver metastasis, COPD, asthma, back pain.     Clinical Impressions Patient presenting with decreased Ind in self care,balance, functional mobility/transfers, endurance, and safety awareness. Patient reports living at home alone with support from PCA 7days/wk. They are "in and out" multiple times a day to assist as needed per pt report. They assist her with ADLs and IADLs. She is close to baseline at this time. Pt needing mod A for bed mobility but typically sleeps in recliner chair at home. Pt stands with close supervision and use of RW. She has bent posture but reports this is baseline. She turns and makes bed with supervision. No LOB.  Patient will benefit from acute OT to increase overall independence in the areas of ADLs, functional mobility,and safety awareness in order to safely discharge.      If plan is discharge home, recommend the following:   A little help with walking and/or transfers;A little help with bathing/dressing/bathroom;Assistance with cooking/housework;Assist for transportation;Help with stairs or ramp for entrance     Functional Status Assessment   Patient has had a recent decline in their functional status and demonstrates the ability to make significant improvements in function in a reasonable and predictable  amount of time.     Equipment Recommendations   BSC/3in1;Hospital bed     Precautions/Restrictions   Precautions Precautions: Fall Recall of Precautions/Restrictions: Intact     Mobility Bed Mobility Overal bed mobility: Needs Assistance Bed Mobility: Supine to Sit, Sit to Supine     Supine to sit: Mod assist Sit to supine: Mod assist   General bed mobility comments: assist for B LE's    Transfers Overall transfer level: Needs assistance Equipment used: Rolling walker (2 wheels) Transfers: Sit to/from Stand Sit to Stand: Supervision                  Balance Overall balance assessment: Needs assistance Sitting-balance support: No upper extremity supported, Feet supported Sitting balance-Leahy Scale: Good     Standing balance support: Single extremity supported, Reliant on assistive device for balance, During functional activity Standing balance-Leahy Scale: Good                             ADL either performed or assessed with clinical judgement   ADL Overall ADL's : Needs assistance/impaired                                       General ADL Comments: OT anticipates that pt would need min A for LB clothing management but this is her baseline. She stands and makes bed with supervision.     Vision Patient Visual Report: No change from baseline              Pertinent Vitals/Pain  Pain Assessment Pain Assessment: Faces Faces Pain Scale: Hurts even more Pain Location: L hip pain Pain Descriptors / Indicators: Aching, Discomfort Pain Intervention(s): Limited activity within patient's tolerance, Monitored during session, Repositioned     Extremity/Trunk Assessment Upper Extremity Assessment Upper Extremity Assessment: Generalized weakness   Lower Extremity Assessment Lower Extremity Assessment: Generalized weakness       Communication Communication Communication: No apparent difficulties   Cognition Arousal:  Alert Behavior During Therapy: WFL for tasks assessed/performed Cognition: No apparent impairments                               Following commands: Intact       Cueing  General Comments   Cueing Techniques: Verbal cues              Home Living Family/patient expects to be discharged to:: Private residence Living Arrangements: Alone Available Help at Discharge: Family;Personal care attendant;Available PRN/intermittently Type of Home: Apartment Home Access: Stairs to enter Entrance Stairs-Number of Steps: 1 step to enter apt building (no railing) and then 3-4 steps with B railing (unable to reach both at same time) to enter home   Home Layout: One level     Bathroom Shower/Tub: Chief Strategy Officer: Standard     Home Equipment: Agricultural consultant (2 wheels);Wheelchair - power          Prior Functioning/Environment Prior Level of Function : Needs assist             Mobility Comments: Pt reports being modified independent ambulating with RW within home (limited household distance d/t SOB and weakness); requires 2 assist typically with stairs; uses motorized w/c in community/outside; sleeps in recliner ADLs Comments: Pt reports having aides 7 days a week (in and out during the day). She wears disposable briefs when they are not there to help her. She ambulates short distances with RW in bent posture.    OT Problem List: Decreased strength;Decreased activity tolerance;Decreased safety awareness;Impaired balance (sitting and/or standing);Decreased knowledge of use of DME or AE;Decreased knowledge of precautions   OT Treatment/Interventions: Self-care/ADL training;Therapeutic exercise;Therapeutic activities;Energy conservation;DME and/or AE instruction;Patient/family education;Balance training      OT Goals(Current goals can be found in the care plan section)   Acute Rehab OT Goals Patient Stated Goal: to go home OT Goal Formulation: With  patient Time For Goal Achievement: 01/12/24 Potential to Achieve Goals: Fair ADL Goals Pt Will Transfer to Toilet: with modified independence;bedside commode;stand pivot transfer Pt Will Perform Toileting - Clothing Manipulation and hygiene: with modified independence;sit to/from stand   OT Frequency:  Min 1X/week       AM-PAC OT "6 Clicks" Daily Activity     Outcome Measure Help from another person eating meals?: None Help from another person taking care of personal grooming?: None Help from another person toileting, which includes using toliet, bedpan, or urinal?: A Little Help from another person bathing (including washing, rinsing, drying)?: A Little Help from another person to put on and taking off regular upper body clothing?: A Little Help from another person to put on and taking off regular lower body clothing?: A Little 6 Click Score: 20   End of Session Equipment Utilized During Treatment: Rolling walker (2 wheels) Nurse Communication: Mobility status  Activity Tolerance: Patient tolerated treatment well Patient left: in bed;with call bell/phone within reach;with bed alarm set  OT Visit Diagnosis: Unsteadiness on feet (R26.81);Muscle weakness (generalized) (M62.81)  Time: 1529-1550 OT Time Calculation (min): 21 min Charges:  OT General Charges $OT Visit: 1 Visit OT Evaluation $OT Eval Low Complexity: 1 Low OT Treatments $Self Care/Home Management : 8-22 mins George Kinder, MS, OTR/L , CBIS ascom [phone]  12/29/23, 4:09 PM

## 2023-12-29 NOTE — Anesthesia Postprocedure Evaluation (Signed)
 Anesthesia Post Note  Patient: Kristina Cruz  Procedure(s) Performed: ECHOCARDIOGRAM, TRANSESOPHAGEAL CARDIOVERSION  Patient location during evaluation: PACU Anesthesia Type: General Level of consciousness: awake and alert Pain management: pain level controlled Vital Signs Assessment: post-procedure vital signs reviewed and stable Respiratory status: spontaneous breathing, nonlabored ventilation, respiratory function stable and patient connected to nasal cannula oxygen Cardiovascular status: blood pressure returned to baseline and stable Postop Assessment: no apparent nausea or vomiting Anesthetic complications: no  No notable events documented.   Last Vitals:  Vitals:   12/29/23 0024 12/29/23 0346  BP: 102/63 123/64  Pulse: 68 71  Resp: 18 18  Temp: 36.5 C 36.4 C  SpO2: 99% 98%    Last Pain:  Vitals:   12/29/23 0346  TempSrc: Oral  PainSc:                  Enrique Harvest

## 2023-12-29 NOTE — Progress Notes (Signed)
 Rounding Note    Patient Name: Kristina Cruz Date of Encounter: 12/29/2023  [facility] Cardiologist: Timothy Gollan, MD   Subjective   S/p successful TEE/DCCV yesterday. She is maintaining sinus rhythm. Patient is asymptomatic from a cardiac perspective. She endorses ongoing abdominal and lower extremity pain. BP soft.   Inpatient Medications    Scheduled Meds:  (feeding supplement) PROSource Plus  30 mL Oral TID BM   vitamin C  500 mg Oral BID   Chlorhexidine  Gluconate Cloth  6 each Topical Q0600   digoxin  0.125 mg Oral Daily   feeding supplement  237 mL Oral TID BM   fluticasone  furoate-vilanterol  1 puff Inhalation Daily   insulin  aspart  0-20 Units Subcutaneous TID WC   leptospermum manuka honey  1 Application Topical Daily   metoprolol tartrate  12.5 mg Oral Q6H   multivitamin with minerals  1 tablet Oral Daily   nystatin  5 mL Mouth/Throat QID   rOPINIRole   2 mg Oral BID   sodium chloride  flush  3 mL Intravenous Q12H   zinc sulfate (50mg  elemental zinc)  220 mg Oral Daily   Continuous Infusions:  ceFEPime (MAXIPIME) IV 2 g (12/29/23 0339)   heparin  1,000 Units/hr (12/29/23 0400)   metronidazole 500 mg (12/28/23 2141)   vancomycin      PRN Meds: acetaminophen  **OR** acetaminophen , gabapentin , ipratropium-albuterol , morphine , ondansetron  **OR** ondansetron  (ZOFRAN ) IV, mouth rinse, oxyCODONE    Vital Signs    Vitals:   12/28/23 2009 12/29/23 0024 12/29/23 0346 12/29/23 0735  BP: (!) 100/57 102/63 123/64 (!) 118/59  Pulse: 71 68 71 74  Resp: 16 18 18 17   Temp: 98.1 F (36.7 C) 97.7 F (36.5 C) 97.6 F (36.4 C) 98.8 F (37.1 C)  TempSrc: Oral Oral Oral   SpO2: 100% 99% 98% 99%  Weight:      Height:        Intake/Output Summary (Last 24 hours) at 12/29/2023 0750 Last data filed at 12/29/2023 0400 Gross per 24 hour  Intake 1341.88 ml  Output --  Net 1341.88 ml      12/25/2023    9:22 AM 12/25/2023    8:42 AM 12/17/2023   10:30 AM  Last 3  Weights  Weight (lbs) 248 lb 3.8 oz -- 258 lb 9.6 oz  Weight (kg) 112.6 kg -- 117.3 kg      Telemetry    Sinus rhythm with rate 70-80 bpm - Personally Reviewed  Physical Exam   GEN: No acute distress.   Neck: No JVD Cardiac: RRR, no murmurs, rubs, or gallops.  Respiratory: Clear to auscultation bilaterally. GI: Soft, nontender, non-distended  MS: 2+ LE edema; No deformity. Neuro:  Nonfocal  Psych: Normal affect   Labs    High Sensitivity Troponin:   Recent Labs  Lab 12/25/23 0946 12/25/23 1140  TROPONINIHS 12 14     Chemistry Recent Labs  Lab 12/25/23 0946 12/26/23 0300 12/27/23 0331 12/28/23 0500 12/29/23 0520  NA 131* 132* 135 137 138  K 3.0* 3.9 3.8 3.8 3.9  CL 93* 99 103 104 105  CO2 24 25 24 25 24   GLUCOSE 219* 207* 134* 132* 164*  BUN 29* 21 16 13 13   CREATININE 1.18* 0.97 0.92 1.03* 0.95  CALCIUM  8.1* 7.7* 7.7* 7.8* 7.9*  MG  --  2.1  --   --   --   PROT 7.0 6.0* 5.9*  --   --   ALBUMIN  2.7* 2.4* 2.3*  --   --  AST 24 23 21   --   --   ALT 15 13 13   --   --   ALKPHOS 98 85 83  --   --   BILITOT 1.5* 1.0 1.0  --   --   GFRNONAA 51* >60 >60 >60 >60  ANIONGAP 14 8 8 8 9     Lipids No results for input(s): "CHOL", "TRIG", "HDL", "LABVLDL", "LDLCALC", "CHOLHDL" in the last 168 hours.  Hematology Recent Labs  Lab 12/27/23 0331 12/28/23 0500 12/29/23 0520  WBC 25.3* 25.2* 22.8*  RBC 3.46* 3.37* 3.07*  HGB 9.7* 9.3* 8.6*  HCT 28.7* 28.1* 26.2*  MCV 82.9 83.4 85.3  MCH 28.0 27.6 28.0  MCHC 33.8 33.1 32.8  RDW 15.7* 15.8* 16.0*  PLT 153 186 203   Thyroid  Recent Labs  Lab 12/26/23 1043  TSH 1.900    BNP Recent Labs  Lab 12/25/23 0946  BNP 109.6*    DDimer  Recent Labs  Lab 12/26/23 0320  DDIMER 3.50*    Cardiac Studies   12/26/2023 Echo complete  1. Left ventricular ejection fraction, by estimation, is 55 to 60%. Left  ventricular ejection fraction by PLAX is 51 %. The left ventricle has  normal function. The left ventricle has  no regional wall motion  abnormalities. There is moderate left  ventricular hypertrophy. Left ventricular diastolic parameters are  indeterminate.   2. Right ventricular systolic function is mildly reduced. The right  ventricular size is normal. Tricuspid regurgitation signal is inadequate  for assessing PA pressure.   3. The mitral valve is normal in structure. No evidence of mitral valve  regurgitation. No evidence of mitral stenosis.   4. The aortic valve is normal in structure. Aortic valve regurgitation is  not visualized. No aortic stenosis is present.   5. The inferior vena cava is normal in size with greater than 50%  respiratory variability, suggesting right atrial pressure of 3 mmHg.   Patient Profile     63 y.o. female with a history of stage V pancreatic cancer w/ liver mets on weekly chemoRx, DMII, HTN, obesity, asthma, and COPD, who was admitted w/ rapid atrial flutter, diarrhea, and sepsis.   Assessment & Plan    Atrial fibrillation/flutter with RVR - Admitted with 2-week history of progressive diarrhea, anorexia, and malaise. Found to be tachycardic at oncology visit 5/9, prompting admission. Wide-complex tachycardia in the setting of known RBBB, rates elevated 140s. - S/p adenosine 6 mg IV x 1 with evidence of flutter waves followed by A-fib. Also received IV digoxin X 2 on 5/9. - S/p successful TEE/DCCV yesterday 5/12 - Remains in sinus rhythm - Will discontinue digoxin as patient maintaining sinus rhythm, dig level 0.6 today - Continue metoprolol tartrate 12.5 mg every 6 hours, soft BP limiting up titration. Consider consolidating/reducing dose. - Transition from IV heparin  to Eliquis 5 mg twice daily  Sepsis Gastroenteritis Cavitary lung lesion - No further diarrhea - Antibiotics/IV fluids per IM  AKI - In the setting of above, resolved  Lower extremity edema - Stable in the setting of hypoalbuminemia, poor appetite, and diarrhea - Normal LV function by  echo 5/10 - Recommended compression stockings - Avoid diuretics at this time secondary to AKI  T2DM - A1c 10.9 - Management per IM  Metastatic pancreatic cancer - Followed by heme/oncology weekly outpatient chemo - Imaging this admission showed progression  For questions or updates, please contact [facility] Please consult [email or web address] for contact info under  Signed, Brodie Cannon, PA-C  12/29/2023, 7:50 AM

## 2023-12-29 NOTE — Progress Notes (Signed)
 1      PROGRESS NOTE    Kristina Cruz  ZOX:[id number] DOB: 1961/08/05 DOA: 12/25/2023 PCP: Chucky Craver, FNP    Brief Narrative:   63 y.o. female with medical history significant of Stage V pancreatic cancer with liver metastasis on chemotherapy, uncontrolled type 2 diabetes, hypertension, COPD and asthma, morbid obesity, who presents to the ED due to weakness   5/10: Cardiology consult for rapid atrial flutter, palliative care consult for goals of care 5/11: PT and OT consultation, metoprolol for rate control,  5/12: Successful TEE/cardioversion, palliative care consult, nutritionist consult 5/13:  Assessment & Plan:   Principal Problem:   Severe sepsis (HCC) Active Problems:   Gastroenteritis   Cavitary lesion of lung   Acute heart failure (HCC)   AKI (acute kidney injury) (HCC)   Pancreatic cancer metastasized to liver (HCC)   Type 2 diabetes mellitus (HCC)   Chronic obstructive pulmonary disease (COPD) (HCC)   Typical atrial flutter (HCC)   Atrial fibrillation with RVR (HCC)   Morbid obesity (HCC)   Pressure injury of skin   Persistent atrial fibrillation (HCC)  * Severe sepsis (HCC) Patient is presenting with severe tachycardia, hypotension, leukocytosis concern for sepsis, with suspected sources being either GI versus pulmonary.  She has been experiencing diarrhea/vomiting for 1 week and a new productive cough for 1 day. -No stool/BM while here.  No exact source of infection found yet - Telemetry monitoring - Blood cultures negative thus far - Will stop antibiotics after tonight's dose - Normal lactic acid (2.0-> 1.8)   Atrial flutter with RVR Cardiology following - Echocardiogram shows EF of 55 to 60% - Status post successful TEE guided cardioversion on 5/12.  Transition to oral Eliquis today from heparin  - Remains in normal sinus rhythm.  Continue metoprolol for rate control   Loss of taste and appetite Likely due to chemotherapy, will add nystatin swish  and swallow Nutritionist started nutritional supplement.  Discussed with Dr. Yu/oncology  Lower extremity edema D-dimer is elevated.  lower extremity Dopplers neg for DVT.  Please note patient refused CT angio chest but, she couldn't lie flat.  I have ordered compression stockings  Left hip/pelvis pain Will order x-ray to rule out any pathology.  She denies any fall.  Rule out any mets   Cavitary lesion of lung During evaluation for sepsis, a new cavitary lesion in the left lower lobe was discovered, measuring 3.5 x 2.6 cm.  Given new cough, this may be a possible source of her sepsis as she is immuno compromised in the setting of chemotherapy or may be new metastasis of her known stage IV pancreatic cancer. - Antibiotics as noted above   Gastroenteritis Patient presenting with 1 week of vomiting and intermittently melanotic diarrhea.  Potentially secondary to recent chemotherapy, however will need to evaluate for infectious etiology as well. No evidence of colitis on imaging   - GI panel ordered but no stool while here - Zofran  as needed   AKI (acute kidney injury) (HCC) Mild AKI in the setting of hypovolemia and sepsis.   Lab Results  Component Value Date   CREATININE 0.95 12/29/2023   CREATININE 1.03 (H) 12/28/2023   CREATININE 0.92 12/27/2023    - improved with IV hydration - Hold nephrotoxic agents - Repeat BMP in the a.m.   Pancreatic cancer metastasized to liver [facility]) History of recently diagnosed stage IV pancreatic cancer with metastasis to the liver on chemotherapy.She last received paclitaxel  and gemcitabine  on April 25.  Followed by Dr. Wilhelmenia Harada at the cancer center Palliative care consult   Chronic obstructive pulmonary disease (COPD) (HCC) Patient has a history of mixed COPD/asthma with multiple recent exacerbations, however no wheezing on examination at this time and she notes her shortness of breath is at baseline.   -  DuoNebs every 6 hours - Continue home  bronchodilators - Pulmonary toilet   Type 2 diabetes mellitus (HCC) Last A1c of 8.7% approximately 4 months ago.  - A1c 10.9 on this admission  Goals of care Consult palliative care   DVT prophylaxis: Eliquis  apixaban (ELIQUIS) tablet 5 mg     Code Status: Full code Family Communication: No family at bedside Disposition Plan: Possible discharge in 1-2 days depending on clinical condition, cardiology workup   Consultants:  Cardiology    Antimicrobials:  Cefepime Flagyl Vancomycin    Subjective:  Reports left hip/pelvic pain.  Hoping to work with physical therapy/Occupational Therapy 1 more session tomorrow for consideration to go home  Objective: Vitals:   12/29/23 0024 12/29/23 0346 12/29/23 0735 12/29/23 1130  BP: 102/63 123/64 (!) 118/59 (!) 104/56  Pulse: 68 71 74 67  Resp: 18 18 17 17   Temp: 97.7 F (36.5 C) 97.6 F (36.4 C) 98.8 F (37.1 C) 98.2 F (36.8 C)  TempSrc: Oral Oral    SpO2: 99% 98% 99% 98%  Weight:      Height:        Intake/Output Summary (Last 24 hours) at 12/29/2023 1413 Last data filed at 12/29/2023 1047 Gross per 24 hour  Intake 871.88 ml  Output --  Net 871.88 ml   Filed Weights   12/25/23 0922  Weight: 112.6 kg    Examination:  General exam: Appears calm and comfortable  Respiratory system: Clear to auscultation. Respiratory effort normal. Cardiovascular system: Regular rate and rhythm, no murmur Gastrointestinal system: Abdomen is soft, benign Central nervous system: Alert and oriented. No focal neurological deficits. Extremities: Symmetric 5 x 5 power. Skin: No rashes, lesions or ulcers Psychiatry: Judgement and insight appear normal. Mood & affect appropriate.     Data Reviewed: I have personally reviewed following labs and imaging studies  CBC: Recent Labs  Lab 12/25/23 0816 12/25/23 0946 12/26/23 0300 12/27/23 0331 12/28/23 0500 12/29/23 0520  WBC 24.4* 24.6* 25.5* 25.3* 25.2* 22.8*  NEUTROABS 12.4*  15.7* 13.3*  --   --   --   HGB 10.9* 10.7* 10.0* 9.7* 9.3* 8.6*  HCT 32.0* 31.3* 29.6* 28.7* 28.1* 26.2*  MCV 81.2 81.5 81.8 82.9 83.4 85.3  PLT 189 164 167 153 186 203   Basic Metabolic Panel: Recent Labs  Lab 12/25/23 0946 12/26/23 0300 12/27/23 0331 12/28/23 0500 12/29/23 0520  NA 131* 132* 135 137 138  K 3.0* 3.9 3.8 3.8 3.9  CL 93* 99 103 104 105  CO2 24 25 24 25 24   GLUCOSE 219* 207* 134* 132* 164*  BUN 29* 21 16 13 13   CREATININE 1.18* 0.97 0.92 1.03* 0.95  CALCIUM  8.1* 7.7* 7.7* 7.8* 7.9*  MG  --  2.1  --   --   --   PHOS  --  2.5  --   --   --    GFR: Estimated Creatinine Clearance: 70 mL/min (by C-G formula based on SCr of 0.95 mg/dL). Liver Function Tests: Recent Labs  Lab 12/25/23 0816 12/25/23 0946 12/26/23 0300 12/27/23 0331  AST 25 24 23 21   ALT 14 15 13 13   ALKPHOS 120 98 85 83  BILITOT 1.6*  1.5* 1.0 1.0  PROT 6.9 7.0 6.0* 5.9*  ALBUMIN  2.8* 2.7* 2.4* 2.3*   Recent Labs  Lab 12/25/23 0946  LIPASE 35   No results for input(s): "AMMONIA" in the last 168 hours. Coagulation Profile: Recent Labs  Lab 12/25/23 0945 12/26/23 1043  INR 1.2 1.3*     CBG: Recent Labs  Lab 12/28/23 1120 12/28/23 1721 12/28/23 2110 12/29/23 0736 12/29/23 1131  GLUCAP 109* 169* 86 140* 98    Sepsis Labs: Recent Labs  Lab 12/25/23 0945 12/25/23 1140 12/25/23 2235  LATICACIDVEN 1.6 2.0* 1.8    Recent Results (from the past 240 hours)  Culture, blood (Routine x 2)     Status: None (Preliminary result)   Collection Time: 12/25/23  9:45 AM   Specimen: BLOOD  Result Value Ref Range Status   Specimen Description BLOOD RIGHT ANTECUBITAL  Final   Special Requests   Final    BOTTLES DRAWN AEROBIC AND ANAEROBIC Blood Culture adequate volume   Culture   Final    NO GROWTH 4 DAYS Performed at [facility], [address], [place], [place] [address]    Report Status PENDING  Incomplete  Culture, blood (Routine x 2)     Status: None (Preliminary  result)   Collection Time: 12/25/23  9:45 AM   Specimen: BLOOD  Result Value Ref Range Status   Specimen Description BLOOD BLOOD LEFT HAND  Final   Special Requests   Final    BOTTLES DRAWN AEROBIC AND ANAEROBIC Blood Culture results may not be optimal due to an inadequate volume of blood received in culture bottles   Culture   Final    NO GROWTH 4 DAYS Performed at [facility], [address] [place], [place] [address]    Report Status PENDING  Incomplete  Resp panel by RT-PCR (RSV, Flu A&B, Covid) Anterior Nasal Swab     Status: None   Collection Time: 12/25/23  9:46 AM   Specimen: Anterior Nasal Swab  Result Value Ref Range Status   SARS Coronavirus 2 by RT PCR NEGATIVE NEGATIVE Final    Comment: (NOTE) SARS-CoV-2 target nucleic acids are NOT DETECTED.  The SARS-CoV-2 RNA is generally detectable in upper respiratory specimens during the acute phase of infection. The lowest concentration of SARS-CoV-2 viral copies this assay can detect is 138 copies/mL. A negative result does not preclude SARS-Cov-2 infection and should not be used as the sole basis for treatment or other patient management decisions. A negative result may occur with  improper specimen collection/handling, submission of specimen other than nasopharyngeal swab, presence of viral mutation(s) within the areas targeted by this assay, and inadequate number of viral copies(<138 copies/mL). A negative result must be combined with clinical observations, patient history, and epidemiological information. The expected result is Negative.  Fact Sheet for Patients:  BloggerCourse.com  Fact Sheet for Healthcare Providers:  SeriousBroker.it  This test is no t yet approved or cleared by the [place]  FDA and  has been authorized for detection and/or diagnosis of SARS-CoV-2 by FDA under an Emergency Use Authorization (EUA). This EUA will remain  in effect  (meaning this test can be used) for the duration of the COVID-19 declaration under Section 564(b)(1) of the Act, 21 U.S.C.section 360bbb-3(b)(1), unless the authorization is terminated  or revoked sooner.       Influenza A by PCR NEGATIVE NEGATIVE Final   Influenza B by PCR NEGATIVE NEGATIVE Final    Comment: (NOTE) The Xpert Xpress SARS-CoV-2/FLU/RSV plus  assay is intended as an aid in the diagnosis of influenza from Nasopharyngeal swab specimens and should not be used as a sole basis for treatment. Nasal washings and aspirates are unacceptable for Xpert Xpress SARS-CoV-2/FLU/RSV testing.  Fact Sheet for Patients: BloggerCourse.com  Fact Sheet for Healthcare Providers: SeriousBroker.it  This test is not yet approved or cleared by the [place]  FDA and has been authorized for detection and/or diagnosis of SARS-CoV-2 by FDA under an Emergency Use Authorization (EUA). This EUA will remain in effect (meaning this test can be used) for the duration of the COVID-19 declaration under Section 564(b)(1) of the Act, 21 U.S.C. section 360bbb-3(b)(1), unless the authorization is terminated or revoked.     Resp Syncytial Virus by PCR NEGATIVE NEGATIVE Final    Comment: (NOTE) Fact Sheet for Patients: BloggerCourse.com  Fact Sheet for Healthcare Providers: SeriousBroker.it  This test is not yet approved or cleared by the [place]  FDA and has been authorized for detection and/or diagnosis of SARS-CoV-2 by FDA under an Emergency Use Authorization (EUA). This EUA will remain in effect (meaning this test can be used) for the duration of the COVID-19 declaration under Section 564(b)(1) of the Act, 21 U.S.C. section 360bbb-3(b)(1), unless the authorization is terminated or revoked.  Performed at [facility], [address], [place], [place] [address]           Radiology Studies: ECHO TEE Result Date: 12/28/2023    TRANSESOPHOGEAL ECHO REPORT   Patient Name:   Kristina Cruz Date of Exam: 12/28/2023 Medical Rec #:  [id number]          Height:       62.0 in Accession #:    [id number]         Weight:       248.2 lb Date of Birth:  1960-12-14          BSA:          2.096 m Patient Age:    63 years           BP:           83/54 mmHg Patient Gender: F                  HR:           85 bpm. Exam Location:  [facility] Procedure: Transesophageal Echo, Color Doppler and Cardiac Doppler (Both            Spectral and Color Flow Doppler were utilized during procedure). Indications:     Atrial Fibrillation I48.91  History:         Patient has prior history of Echocardiogram examinations, most                  recent 12/26/2023. Risk Factors:Diabetes and Hypertension. GERD.  Sonographer:     Broadus Canes Referring Phys:  [id number] BERGE Diagnosing Phys: Constancia Delton MD PROCEDURE: The transesophogeal probe was passed without difficulty through the esophogus of the patient. Sedation performed by different physician. The patient developed no complications during the procedure.  IMPRESSIONS  1. Left ventricular ejection fraction, by estimation, is 55 to 60%. The left ventricle has normal function.  2. Right ventricular systolic function is normal. The right ventricular size is normal.  3. No left atrial/left atrial appendage thrombus was detected.  4. The mitral valve is normal in structure. Trivial mitral valve regurgitation.  5. Tricuspid valve regurgitation is mild to moderate.  6. The aortic valve is  tricuspid. Aortic valve regurgitation is not visualized. FINDINGS  Left Ventricle: Left ventricular ejection fraction, by estimation, is 55 to 60%. The left ventricle has normal function. The left ventricular internal cavity size was normal in size. Right Ventricle: The right ventricular size is normal. No increase in right ventricular wall thickness. Right  ventricular systolic function is normal. Left Atrium: Left atrial size was normal in size. No left atrial/left atrial appendage thrombus was detected. Right Atrium: Right atrial size was normal in size. Pericardium: There is no evidence of pericardial effusion. Mitral Valve: The mitral valve is normal in structure. Trivial mitral valve regurgitation. Tricuspid Valve: The tricuspid valve is normal in structure. Tricuspid valve regurgitation is mild to moderate. Aortic Valve: The aortic valve is tricuspid. Aortic valve regurgitation is not visualized. Pulmonic Valve: The pulmonic valve was grossly normal. Pulmonic valve regurgitation is trivial. Aorta: The aortic root is normal in size and structure. IAS/Shunts: No atrial level shunt detected by color flow Doppler. Constancia Delton MD Electronically signed by Constancia Delton MD Signature Date/Time: 12/28/2023/4:39:22 PM    Final         Scheduled Meds:  (feeding supplement) PROSource Plus  30 mL Oral TID BM   apixaban  5 mg Oral BID   vitamin C  500 mg Oral BID   Chlorhexidine  Gluconate Cloth  6 each Topical Q0600   feeding supplement  237 mL Oral TID BM   fluticasone  furoate-vilanterol  1 puff Inhalation Daily   insulin  aspart  0-20 Units Subcutaneous TID WC   leptospermum manuka honey  1 Application Topical Daily   metoprolol tartrate  12.5 mg Oral Q6H   multivitamin with minerals  1 tablet Oral Daily   nystatin  5 mL Mouth/Throat QID   rOPINIRole   2 mg Oral BID   sodium chloride  flush  3 mL Intravenous Q12H   zinc sulfate (50mg  elemental zinc)  220 mg Oral Daily   Continuous Infusions:  ceFEPime (MAXIPIME) IV 2 g (12/29/23 1012)   metronidazole 500 mg (12/29/23 1051)     LOS: 4 days    Time spent: 35 minutes    Siniya Lichty Mason Sole, MD [facility] Pager [phone]  If 7PM-7AM, please contact night-coverage [email or web address]  12/29/2023, 2:13 PM

## 2023-12-29 NOTE — Consult Note (Signed)
 PHARMACY - ANTICOAGULATION CONSULT NOTE  Pharmacy Consult for Heparin  Indication: Atrial Flutter   Allergies  Allergen Reactions   Peanut-Containing Drug Products    Patient Measurements: Height: 5\' 2"  (157.5 cm) Weight: 112.6 kg (248 lb 3.8 oz) IBW/kg (Calculated) : 50.1 HEPARIN  DW (KG): 77.6  Vital Signs: Temp: 97.6 F (36.4 C) (05/13 0346) Temp Source: Oral (05/13 0346) BP: 123/64 (05/13 0346) Pulse Rate: 71 (05/13 0346)  Labs: Recent Labs    12/26/23 1043 12/26/23 1830 12/27/23 0331 12/27/23 1150 12/27/23 1747 12/28/23 0500 12/29/23 0520  HGB  --    < > 9.7*  --   --  9.3* 8.6*  HCT  --   --  28.7*  --   --  28.1* 26.2*  PLT  --   --  153  --   --  186 203  APTT 40*  --   --   --   --   --   --   LABPROT 16.8*  --   --   --   --   --   --   INR 1.3*  --   --   --   --   --   --   HEPARINUNFRC  --    < > 0.29*   < > 0.48 0.47 0.38  CREATININE  --   --  0.92  --   --  1.03* 0.95   < > = values in this interval not displayed.   Estimated Creatinine Clearance: 70 mL/min (by C-G formula based on SCr of 0.95 mg/dL).  Medical History: Past Medical History:  Diagnosis Date   Anemia    Asthma    Back pain    Cataract    Diabetes mellitus without complication (HCC)    GERD (gastroesophageal reflux disease)    History of echocardiogram    Hypertension    Morbid obesity with BMI of 60.0-69.9, adult (HCC)    Restless leg syndrome    Medications:  No PTA anticoagulation  DVT prophylactic LMWH given 5/9 @ 2013   Assessment: 63 year old female found to be in atrial flutter with RVR. From chart review, patient was not on any anticoagulation prior to admission. Pharmacy has been consulted for initiation and management of a heparin  infusion. Baseline labs: Hgb 10.0, PLT 167, aPTT & INR have been ordered.   Goal of Therapy:  Heparin  level 0.3-0.7 units/ml Monitor platelets by anticoagulation protocol: Yes  0510 @ 1830: HL >1.10 = Supratherapeutic 05/11 0331 HL  0.29, slightly subtherapeutic 05/11 1150 HL 0.34, therapeutic  05/11 1747 HL 0.48, therapeutic x 2 05/12 0500 HL 0.47, therapeutic x 3 05/13 0520 HL 0.38, therapeutic x 4    Plan: Continue heparin  infusion at 1000 units/hr  CBC and HL daily while on heparin  infusion F/u transition to PO anticoagulation if indicated  Coretta Dexter, PharmD, [facility] 12/29/2023 6:26 AM

## 2023-12-29 NOTE — Evaluation (Signed)
 Physical Therapy Evaluation Patient Details Name: Kristina Cruz MRN: [id number] DOB: 03/10/1961 Today's Date: 12/29/2023  History of Present Illness  Pt is a 63 y.o. female presenting to hospital 12/25/23 with c/o generalized weakness associated with N/V/D and lightheadedness; also SOB and swelling B LE's; sent over from [facility] today d/t elevated WBC count.  Pt admitted with severe sepsis, acute heart failure, cavitary lesion of lung, gastroenteritis, AKI, pancreatic CA metastasized to liver; pt also with atrial flutter with RVR.  PMH includes anemia, asthma, DM, gastric reflux, htn, morbid obesity, LE edema, RLS, stage IV pancreatic CA on chemo with liver metastasis, COPD, asthma, back pain.  Clinical Impression  Prior to recent medical concerns, pt reports being modified independent with ambulation (household distances with RW use); lives alone but has aides that assist 7 days a week (pt reports they are in/out during the day).  8/10 L hip pain reported during session (MD and nursing notified; pt pre-medicated with pain medication).  Currently pt is mod assist with bed mobility; CGA with transfers using RW (increased effort and time for pt to perform on own); and CGA to ambulate short distances in pt's room using RW (pt with significant flexed trunk posture and leaning onto walker with B forearms--pt reports this is her typical walking posture).  Pt's HR 70-82 bpm and SpO2 sats 92% or greater on room air during sessions activities.  Pt would currently benefit from skilled PT to address noted impairments and functional limitations (see below for any additional details).  Upon hospital discharge, pt would benefit from ongoing therapy.     If plan is discharge home, recommend the following: A little help with walking and/or transfers;A little help with bathing/dressing/bathroom;Assistance with cooking/housework;Assist for transportation;Help with stairs or ramp for entrance   Can travel by private  vehicle        Equipment Recommendations Hospital bed  Recommendations for Other Services  OT consult    Functional Status Assessment Patient has had a recent decline in their functional status and demonstrates the ability to make significant improvements in function in a reasonable and predictable amount of time.     Precautions / Restrictions Precautions Precautions: Fall Recall of Precautions/Restrictions: Intact Restrictions Weight Bearing Restrictions Per Provider Order: No      Mobility  Bed Mobility Overal bed mobility: Needs Assistance Bed Mobility: Supine to Sit, Sit to Supine     Supine to sit: Mod assist, HOB elevated, Used rails Sit to supine: Mod assist   General bed mobility comments: assist for B LE's    Transfers Overall transfer level: Needs assistance Equipment used: Rolling walker (2 wheels) Transfers: Sit to/from Stand Sit to Stand: Contact guard assist, Supervision           General transfer comment: extra time and effort to perform on own (pt counting to 3 multiple times before eventually standing); x1 trial from bed and x1 trial from toilet (use of grab bar)    Ambulation/Gait Ambulation/Gait assistance: Contact guard assist Gait Distance (Feet):  (15 feet; 20 feet) Assistive device: Rolling walker (2 wheels)   Gait velocity: decreased     General Gait Details: pt with significant flexed trunk posture (pt leaning on walker with forearms--pt reports how she typically walks); increased effort/time to take steps  Stairs            Wheelchair Mobility     Tilt Bed    Modified Rankin (Stroke Patients Only)       Balance Overall balance  assessment: Needs assistance Sitting-balance support: No upper extremity supported, Feet supported Sitting balance-Leahy Scale: Good Sitting balance - Comments: steady reaching within BOS   Standing balance support: Single extremity supported Standing balance-Leahy Scale: Good Standing  balance comment: steady managing underwear in standing                             Pertinent Vitals/Pain Pain Assessment Pain Assessment: 0-10 Pain Score: 8  Pain Location: L hip pain Pain Descriptors / Indicators: Aching Pain Intervention(s): Limited activity within patient's tolerance, Monitored during session, Premedicated before session, Repositioned    Home Living Family/patient expects to be discharged to:: Private residence Living Arrangements: Alone Available Help at Discharge: Family;Personal care attendant;Available PRN/intermittently Type of Home: Apartment Home Access: Stairs to enter   Entrance Stairs-Number of Steps: 1 step to enter apt building (no railing) and then 3-4 steps with B railing (unable to reach both at same time) to enter home   Home Layout: One level Home Equipment: Agricultural consultant (2 wheels);Wheelchair - power      Prior Function Prior Level of Function : Needs assist             Mobility Comments: Pt reports being modified independent ambulating with RW within home (limited household distance d/t SOB and weakness); requires 2 assist typically with stairs; uses motorized w/c in community/outside; sleeps in recliner ADLs Comments: Pt reports having aides 7 days a week (in and out during the day).     Extremity/Trunk Assessment   Upper Extremity Assessment Upper Extremity Assessment: Defer to OT evaluation    Lower Extremity Assessment Lower Extremity Assessment: Generalized weakness       Communication   Communication Communication: No apparent difficulties    Cognition Arousal: Alert Behavior During Therapy: WFL for tasks assessed/performed   PT - Cognitive impairments: No apparent impairments                         Following commands: Intact       Cueing Cueing Techniques: Verbal cues     General Comments  Nursing cleared pt for participation in physical therapy.  Pt agreeable to PT session.     Exercises     Assessment/Plan    PT Assessment Patient needs continued PT services  PT Problem List Decreased strength;Decreased activity tolerance;Decreased balance;Decreased mobility;Pain       PT Treatment Interventions DME instruction;Gait training;Stair training;Functional mobility training;Therapeutic activities;Therapeutic exercise;Balance training;Patient/family education    PT Goals (Current goals can be found in the Care Plan section)  Acute Rehab PT Goals Patient Stated Goal: to improve strength and walking PT Goal Formulation: With patient Time For Goal Achievement: 01/12/24 Potential to Achieve Goals: Good    Frequency Min 3X/week     Co-evaluation               AM-PAC PT "6 Clicks" Mobility  Outcome Measure Help needed turning from your back to your side while in a flat bed without using bedrails?: None Help needed moving from lying on your back to sitting on the side of a flat bed without using bedrails?: A Lot Help needed moving to and from a bed to a chair (including a wheelchair)?: A Little Help needed standing up from a chair using your arms (e.g., wheelchair or bedside chair)?: A Little Help needed to walk in hospital room?: A Little Help needed climbing 3-5 steps with a railing? :  A Lot 6 Click Score: 17    End of Session Equipment Utilized During Treatment: Gait belt Activity Tolerance: Patient limited by fatigue Patient left: in bed;with call bell/phone within reach;with bed alarm set Nurse Communication: Mobility status;Precautions;Other (comment) (Pt's pain status) PT Visit Diagnosis: Other abnormalities of gait and mobility (R26.89);Muscle weakness (generalized) (M62.81);Pain Pain - Right/Left: Left Pain - part of body: Hip    Time: 8921-1941 PT Time Calculation (min) (ACUTE ONLY): 38 min   Charges:   PT Evaluation $PT Eval Low Complexity: 1 Low PT Treatments $Therapeutic Activity: 23-37 mins PT General Charges $$ ACUTE PT VISIT:  1 Visit        Amador Junes, PT 12/29/23, 11:50 AM

## 2023-12-29 NOTE — Plan of Care (Signed)

## 2023-12-29 NOTE — Consult Note (Signed)
 Palliative Medicine [facility] Telephone:([phone]) [phone] [phone]   Name: Kristina Cruz Date: 12/29/2023 MRN: [id number]  DOB: [birth year]  Patient Care Team: Chucky Craver, FNP as PCP - General (Nurse Practitioner) Devorah Fonder, MD as PCP - Cardiology (Cardiology) [name], MD as Consulting Physician (Oncology) Rochell Chroman, RN as Oncology Nurse Navigator    REASON FOR CONSULTATION: Kristina Cruz is a 63 y.o. 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  Patient was admitted the hospital 12/25/2023 with sepsis suspected GI versus pulmonary source.  Palliative care consulted to address goals and manage ongoing symptoms.  SOCIAL HISTORY:     reports that she has never smoked. She has never used smokeless tobacco. She reports that she does not drink alcohol and does not use drugs.  Patient never married.  Lives at home alone.  She has a daughter who is involved.  Patient also has caregivers for 20 hours weekly.  Patient previously worked as a Lawyer and in a Futures trader.  ADVANCE DIRECTIVES:  Does not have  CODE STATUS: Full code  PAST MEDICAL HISTORY: Past Medical History:  Diagnosis Date   Anemia    Asthma    Back pain    Cataract    Diabetes mellitus without complication (HCC)    GERD (gastroesophageal reflux disease)    History of echocardiogram    Hypertension    Morbid obesity with BMI of 60.0-69.9, adult (HCC)    Restless leg syndrome     PAST SURGICAL HISTORY:  Past Surgical History:  Procedure Laterality Date   ABDOMINAL HYSTERECTOMY     CARDIOVERSION N/A 12/28/2023   Procedure: CARDIOVERSION;  Surgeon: Constancia Delton, MD;  Location: [facility] ORS;  Service: Cardiovascular;  Laterality: N/A;   CATARACT EXTRACTION W/PHACO Left 04/13/2020   Procedure: CATARACT EXTRACTION PHACO AND INTRAOCULAR  LENS PLACEMENT (IOC);  Surgeon: Ola Berger, MD;  Location: [facility] ORS;  Service: Ophthalmology;  Laterality: Left;  US  00:36.0 CDE 5.95 Fluid Pack lot # [id number]   HYSTEROSCOPY WITH D & C N/A 07/14/2017   Procedure: DILATATION AND CURETTAGE /HYSTEROSCOPY;  Surgeon: Alben Alma, MD;  Location: [facility] ORS;  Service: Gynecology;  Laterality: N/A;   IR IMAGING GUIDED PORT INSERTION  12/17/2023   POLYPECTOMY  2015   TEE WITHOUT CARDIOVERSION N/A 12/28/2023   Procedure: ECHOCARDIOGRAM, TRANSESOPHAGEAL;  Surgeon: Constancia Delton, MD;  Location: [facility] ORS;  Service: Cardiovascular;  Laterality: N/A;    HEMATOLOGY/ONCOLOGY HISTORY:  Oncology History  Pancreatic cancer metastasized to liver (HCC)  08/21/2023 Imaging   CT abdomen pelvis with contrast showed 1. Masslike thickening involving the proximal and mid tail of pancreas with relative hypoenhancement measures approximately 3.9 x 5.5 cm. In the absence of signs/symptoms of acute pancreatitis underlying neoplastic process cannot be excluded. Further evaluation with nonemergent contrast enhanced MRI of the abdomen is advised. Note: Given patient's body habitus and the motion artifact observed on the current exam an MRI obtained at this time is likely to be severely limited and likely nondiagnostic. MRI should be obtained only once the patient is clinically stable, and is able to remain motionless and breath hold. 2. Patchy ground-glass and airspace densities identified within both lower lobes. These are new compared with the previous exam and are concerning for underlying inflammatory/infectious process. 3. Sigmoid diverticulosis without signs of acute diverticulitis. 4. Marked diastasis recti with ventral herniation of the large and  small bowel loops. 5. Periumbilical hernia is containing fat only.   08/24/2023 Imaging   MRI abdomen wo contrast  1. Moderately suboptimal unenhanced exam. 2. Heterogeneous masslike thickening of the  pancreatic body/tail again seen. The lesion remains indeterminate on this exam. Differential diagnosis includes pancreatic tumor, autoimmune pancreatitis, mass forming chronic pancreatitis, etc. Please see above for follow-up recommendations. 3. Mild diffuse hepatic steatosis. There are indeterminate areas in the liver, which can also be better evaluated on the contrast-enhanced MRI abdomen. 4. Other observations, as described above.   11/05/2023 Initial Diagnosis   Pancreatic cancer metastasized to liver  Patient was hospitalized from 11/05/2023 - 11/10/2023 She presented to emergency room due to nausea, vomiting and abdominal pain. CT scan showed multiple hypodense hepatic lesions increasing number and size from prior imaging.  Pancreatic tail mass, hypodense lesion in the anterior spleen.  11/09/2023 ultrasound-guided biopsy was obtained. Pathology showed moderately to poorly differentiated adenocarcinoma with extensive areas of tumor necrosis. The adenocarcinoma is positive for cytokeratin 7 and CDX2 (focal).     Cytokeratin 20, TTF-1, and GATA3 are negative. The pattern of immunoreactivity is not specific and the differential diagnosis includes metastatic adenocarcinoma from the pancreaticobiliary tract and upper GI. Intrahepatic       cholangiocarcinoma is also a diagnostic possibility. Given the radiographic  findings a pancreaticobiliary primary is favored and correlation with clinical impression is required.    11/17/2023 Cancer Staging   Staging form: Exocrine Pancreas, AJCC 8th Edition - Clinical stage from 11/17/2023: Stage IV (cT3, cNX, pM1) - Signed by [name], MD on 11/17/2023 Stage prefix: Initial diagnosis    Imaging   CT abdomen pelvis w contrast  1. Multiple hypodense hepatic lesions, increased in number and size from prior imaging, highly suspicious for metastatic disease. 2. Hypodense lesion in the pancreatic tail measures 7.2 x 3.9 cm, increased in size from prior. The  increase in size favors pancreatic neoplasm, with additional etiologies less likely. 3. New hypodense lesion in the anterior spleen, 13 mm, suspicious for metastatic disease. 4. Laxity of the anterior abdominal wall with midline ventral abdominal wall hernia containing small and to a lesser extent large bowel. No bowel obstruction or inflammation. 5. Colonic diverticulosis without diverticulitis.   Aortic Atherosclerosis (ICD10-I70.0)    11/27/2023 -  Chemotherapy   Patient is on Treatment Plan : PANCREATIC Abraxane  D1,8,15 + Gemcitabine  D1,8,15 q28d       ALLERGIES:  is allergic to peanut-containing drug products.  MEDICATIONS:  Current Facility-Administered Medications  Medication Dose Route Frequency Provider Last Rate Last Admin   (feeding supplement) PROSource Plus liquid 30 mL  30 mL Oral TID BM Mason Sole, Vipul, MD   30 mL at 12/29/23 1326   acetaminophen  (TYLENOL ) tablet 650 mg  650 mg Oral Q6H PRN Avi Body, MD   650 mg at 12/26/23 2249   Or   acetaminophen  (TYLENOL ) suppository 650 mg  650 mg Rectal Q6H PRN Avi Body, MD       apixaban (ELIQUIS) tablet 5 mg  5 mg Oral BID Gildardo Labrador L, PA-C   5 mg at 12/29/23 1006   ascorbic acid (VITAMIN C) tablet 500 mg  500 mg Oral BID Shah, Vipul, MD   500 mg at 12/29/23 1007   ceFEPIme (MAXIPIME) 2 g in sodium chloride  0.9 % 100 mL IVPB  2 g Intravenous Q8H Shah, Vipul, MD 200 mL/hr at 12/29/23 1012 2 g at 12/29/23 1012   Chlorhexidine  Gluconate Cloth 2 % PADS 6 each  6  each Topical Q0600 Brenna Cam, MD   6 each at 12/29/23 1008   feeding supplement (ENSURE ENLIVE / ENSURE PLUS) liquid 237 mL  237 mL Oral TID BM Brenna Cam, MD   237 mL at 12/29/23 1327   fluticasone  furoate-vilanterol (BREO ELLIPTA ) 100-25 MCG/ACT 1 puff  1 puff Inhalation Daily Avi Body, MD       gabapentin  (NEURONTIN ) capsule 100 mg  100 mg Oral TID PRN Shah, Vipul, MD       insulin  aspart (novoLOG ) injection 0-20 Units  0-20 Units Subcutaneous TID  WC Basaraba, Iulia, MD   3 Units at 12/29/23 1610   ipratropium-albuterol  (DUONEB) 0.5-2.5 (3) MG/3ML nebulizer solution 3 mL  3 mL Nebulization Q6H PRN Brenna Cam, MD       leptospermum manuka honey (MEDIHONEY) paste 1 Application  1 Application Topical Daily Avi Body, MD   1 Application at 12/29/23 1006   metoprolol tartrate (LOPRESSOR) tablet 25 mg  25 mg Oral BID End, Veryl Gottron, MD       metroNIDAZOLE (FLAGYL) IVPB 500 mg  500 mg Intravenous Q12H Mason Sole, Vipul, MD 100 mL/hr at 12/29/23 1051 500 mg at 12/29/23 1051   morphine  (MS CONTIN ) 12 hr tablet 15 mg  15 mg Oral Q12H PRN Basaraba, Iulia, MD   15 mg at 12/28/23 2133   multivitamin with minerals tablet 1 tablet  1 tablet Oral Daily Brenna Cam, MD   1 tablet at 12/29/23 1007   nystatin (MYCOSTATIN) 100000 UNIT/ML suspension 500,000 Units  5 mL Mouth/Throat QID Brenna Cam, MD   500,000 Units at 12/29/23 1326   ondansetron  (ZOFRAN ) tablet 4 mg  4 mg Oral Q6H PRN Basaraba, Iulia, MD   4 mg at 12/26/23 0346   Or   ondansetron  (ZOFRAN ) injection 4 mg  4 mg Intravenous Q6H PRN Basaraba, Iulia, MD   4 mg at 12/29/23 9604   Oral care mouth rinse  15 mL Mouth Rinse PRN Gideon Kussmaul, NP       oxyCODONE  (Oxy IR/ROXICODONE ) immediate release tablet 5 mg  5 mg Oral Q6H PRN Elisabeth Guild, NP   5 mg at 12/29/23 1007   rOPINIRole  (REQUIP ) tablet 2 mg  2 mg Oral BID Basaraba, Iulia, MD   2 mg at 12/29/23 1007   sodium chloride  flush (NS) 0.9 % injection 3 mL  3 mL Intravenous Q12H Avi Body, MD   3 mL at 12/29/23 1008   zinc sulfate (50mg  elemental zinc) capsule 220 mg  220 mg Oral Daily Brenna Cam, MD   220 mg at 12/29/23 1007    VITAL SIGNS: BP (!) 92/52 (BP Location: Right Arm)   Pulse 67   Temp 97.9 F (36.6 C) (Oral)   Resp 17   Ht 5\' 2"  (1.575 m)   Wt 248 lb 3.8 oz (112.6 kg)   LMP 01/04/2018   SpO2 100%   BMI 45.40 kg/m  Filed Weights   12/25/23 0922  Weight: 248 lb 3.8 oz (112.6 kg)    Estimated body mass  index is 45.4 kg/m as calculated from the following:   Height as of this encounter: 5\' 2"  (1.575 m).   Weight as of this encounter: 248 lb 3.8 oz (112.6 kg).  LABS: CBC:    Component Value Date/Time   WBC 22.8 (H) 12/29/2023 0520   HGB 8.6 (L) 12/29/2023 0520   HGB 10.9 (L) 12/25/2023 0816   HGB 12.0 04/10/2014 1001   HCT 26.2 (L) 12/29/2023 0520   HCT  38.2 04/10/2014 1001   PLT 203 12/29/2023 0520   PLT 189 12/25/2023 0816   PLT 421 04/10/2014 1001   MCV 85.3 12/29/2023 0520   MCV 83 04/10/2014 1001   NEUTROABS 13.3 (H) 12/26/2023 0300   NEUTROABS 6.3 03/20/2014 0707   LYMPHSABS 2.8 12/26/2023 0300   LYMPHSABS 0.6 (L) 03/20/2014 0707   MONOABS 3.7 (H) 12/26/2023 0300   MONOABS 0.2 03/20/2014 0707   EOSABS 0.0 12/26/2023 0300   EOSABS 0.1 03/20/2014 0707   BASOSABS 0.1 12/26/2023 0300   BASOSABS 0.0 03/20/2014 0707   Comprehensive Metabolic Panel:    Component Value Date/Time   NA 138 12/29/2023 0520   NA 143 04/10/2014 1001   K 3.9 12/29/2023 0520   K 3.4 (L) 04/10/2014 1001   CL 105 12/29/2023 0520   CL 108 (H) 04/10/2014 1001   CO2 24 12/29/2023 0520   CO2 31 04/10/2014 1001   BUN 13 12/29/2023 0520   BUN 14 04/10/2014 1001   CREATININE 0.95 12/29/2023 0520   CREATININE 1.33 (H) 12/25/2023 0816   CREATININE 1.14 04/10/2014 1001   GLUCOSE 164 (H) 12/29/2023 0520   GLUCOSE 107 (H) 04/10/2014 1001   CALCIUM  7.9 (L) 12/29/2023 0520   CALCIUM  8.8 04/10/2014 1001   AST 21 12/27/2023 0331   AST 25 12/25/2023 0816   ALT 13 12/27/2023 0331   ALT 14 12/25/2023 0816   ALT 14 04/10/2014 1001   ALKPHOS 83 12/27/2023 0331   ALKPHOS 82 04/10/2014 1001   BILITOT 1.0 12/27/2023 0331   BILITOT 1.6 (H) 12/25/2023 0816   PROT 5.9 (L) 12/27/2023 0331   PROT 7.5 04/10/2014 1001   ALBUMIN  2.3 (L) 12/27/2023 0331   ALBUMIN  3.1 (L) 04/10/2014 1001    RADIOGRAPHIC STUDIES: ECHO TEE Result Date: 12/28/2023    TRANSESOPHOGEAL ECHO REPORT   Patient Name:   Kristina Cruz  Date of Exam: 12/28/2023 Medical Rec #:  [id number]          Height:       62.0 in Accession #:    [id number]         Weight:       248.2 lb Date of Birth:  1960-08-30          BSA:          2.096 m Patient Age:    63 years           BP:           83/54 mmHg Patient Gender: F                  HR:           85 bpm. Exam Location:  [facility] Procedure: Transesophageal Echo, Color Doppler and Cardiac Doppler (Both            Spectral and Color Flow Doppler were utilized during procedure). Indications:     Atrial Fibrillation I48.91  History:         Patient has prior history of Echocardiogram examinations, most                  recent 12/26/2023. Risk Factors:Diabetes and Hypertension. GERD.  Sonographer:     Broadus Canes Referring Phys:  [id number] Eugene Hertz BERGE Diagnosing Phys: Constancia Delton MD PROCEDURE: The transesophogeal probe was passed without difficulty through the esophogus of the patient. Sedation performed by different physician. The patient developed no complications during the procedure.  IMPRESSIONS  1. Left ventricular ejection fraction, by  estimation, is 55 to 60%. The left ventricle has normal function.  2. Right ventricular systolic function is normal. The right ventricular size is normal.  3. No left atrial/left atrial appendage thrombus was detected.  4. The mitral valve is normal in structure. Trivial mitral valve regurgitation.  5. Tricuspid valve regurgitation is mild to moderate.  6. The aortic valve is tricuspid. Aortic valve regurgitation is not visualized. FINDINGS  Left Ventricle: Left ventricular ejection fraction, by estimation, is 55 to 60%. The left ventricle has normal function. The left ventricular internal cavity size was normal in size. Right Ventricle: The right ventricular size is normal. No increase in right ventricular wall thickness. Right ventricular systolic function is normal. Left Atrium: Left atrial size was normal in size. No left atrial/left atrial appendage thrombus was  detected. Right Atrium: Right atrial size was normal in size. Pericardium: There is no evidence of pericardial effusion. Mitral Valve: The mitral valve is normal in structure. Trivial mitral valve regurgitation. Tricuspid Valve: The tricuspid valve is normal in structure. Tricuspid valve regurgitation is mild to moderate. Aortic Valve: The aortic valve is tricuspid. Aortic valve regurgitation is not visualized. Pulmonic Valve: The pulmonic valve was grossly normal. Pulmonic valve regurgitation is trivial. Aorta: The aortic root is normal in size and structure. IAS/Shunts: No atrial level shunt detected by color flow Doppler. Constancia Delton MD Electronically signed by Constancia Delton MD Signature Date/Time: 12/28/2023/4:39:22 PM    Final    ECHOCARDIOGRAM COMPLETE Result Date: 12/26/2023    ECHOCARDIOGRAM REPORT   Patient Name:   Kristina Cruz Date of Exam: 12/26/2023 Medical Rec #:  [id number]          Height:       62.0 in Accession #:    [id number]         Weight:       248.2 lb Date of Birth:  08-03-1961          BSA:          2.096 m Patient Age:    63 years           BP:           91/66 mmHg Patient Gender: F                  HR:           111 bpm. Exam Location:  [facility] Procedure: 2D Echo (Both Spectral and Color Flow Doppler were utilized during            procedure). Indications:     CHF I50.31  History:         Patient has prior history of Echocardiogram examinations, most                  recent 02/12/2023.  Sonographer:     Clenton Czech RDCS, FASE Referring Phys:  [id number] Avi Body Diagnosing Phys: Belva Boyden MD  Sonographer Comments: Technically challenging study due to limited acoustic windows, suboptimal apical window and patient is obese. Image acquisition challenging due to patient body habitus and Image acquisition challenging due to uncooperative patient. The patient declined Definity  IV ultrasound imaging agent. IMPRESSIONS  1. Left ventricular ejection fraction, by estimation,  is 55 to 60%. Left ventricular ejection fraction by PLAX is 51 %. The left ventricle has normal function. The left ventricle has no regional wall motion abnormalities. There is moderate left ventricular hypertrophy. Left ventricular diastolic parameters are indeterminate.  2. Right ventricular systolic function is  mildly reduced. The right ventricular size is normal. Tricuspid regurgitation signal is inadequate for assessing PA pressure.  3. The mitral valve is normal in structure. No evidence of mitral valve regurgitation. No evidence of mitral stenosis.  4. The aortic valve is normal in structure. Aortic valve regurgitation is not visualized. No aortic stenosis is present.  5. The inferior vena cava is normal in size with greater than 50% respiratory variability, suggesting right atrial pressure of 3 mmHg. FINDINGS  Left Ventricle: Left ventricular ejection fraction, by estimation, is 55 to 60%. Left ventricular ejection fraction by PLAX is 51 %. The left ventricle has normal function. The left ventricle has no regional wall motion abnormalities. Strain was performed and the global longitudinal strain is indeterminate. The left ventricular internal cavity size was normal in size. There is moderate left ventricular hypertrophy. Left ventricular diastolic parameters are indeterminate. Right Ventricle: The right ventricular size is normal. No increase in right ventricular wall thickness. Right ventricular systolic function is mildly reduced. Tricuspid regurgitation signal is inadequate for assessing PA pressure. Left Atrium: Left atrial size was normal in size. Right Atrium: Right atrial size was normal in size. Pericardium: There is no evidence of pericardial effusion. Mitral Valve: The mitral valve is normal in structure. No evidence of mitral valve regurgitation. No evidence of mitral valve stenosis. Tricuspid Valve: The tricuspid valve is normal in structure. Tricuspid valve regurgitation is not demonstrated. No  evidence of tricuspid stenosis. Aortic Valve: The aortic valve is normal in structure. Aortic valve regurgitation is not visualized. No aortic stenosis is present. Pulmonic Valve: The pulmonic valve was normal in structure. Pulmonic valve regurgitation is not visualized. No evidence of pulmonic stenosis. Aorta: The aortic root is normal in size and structure. Venous: The inferior vena cava is normal in size with greater than 50% respiratory variability, suggesting right atrial pressure of 3 mmHg. IAS/Shunts: No atrial level shunt detected by color flow Doppler. Additional Comments: 3D was performed not requiring image post processing on an independent workstation and was indeterminate.  LEFT VENTRICLE PLAX 2D LV EF:         Left ventricular ejection fraction by PLAX is 51 %. LVIDd:         4.60 cm LVIDs:         3.40 cm LV PW:         1.40 cm LV IVS:        1.40 cm LVOT diam:     1.90 cm LVOT Area:     2.84 cm  LEFT ATRIUM         Index LA diam:    3.30 cm 1.57 cm/m                        PULMONIC VALVE AORTA                 PV Vmax:       0.98 m/s Ao Root diam: 3.90 cm PV Peak grad:  3.8 mmHg Ao Asc diam:  3.00 cm  MITRAL VALVE               TRICUSPID VALVE MV Area (PHT): 4.46 cm    TR Peak grad:   13.0 mmHg MV Decel Time: 170 msec    TR Vmax:        180.00 cm/s MV E velocity: 98.50 cm/s MV A velocity: 68.70 cm/s  SHUNTS MV E/A ratio:  1.43  Systemic Diam: 1.90 cm Belva Boyden MD Electronically signed by Belva Boyden MD Signature Date/Time: 12/26/2023/4:06:59 PM    Final    US  Venous Img Lower Bilateral (DVT) Result Date: 12/25/2023 CLINICAL DATA:  Lower extremity edema for 2 weeks EXAM: Bilateral Lower Extremity Venous Doppler Ultrasound TECHNIQUE: Gray-scale sonography with compression, as well as color and duplex ultrasound, were performed to evaluate the deep venous system(s) from the level of the common femoral vein through the popliteal and proximal calf veins. COMPARISON:  None available  FINDINGS: VENOUS Normal compressibility of the common femoral, superficial femoral, and popliteal veins, as well as the visualized calf veins. Visualized portions of profunda femoral vein and great saphenous vein unremarkable. No filling defects to suggest DVT on grayscale or color Doppler imaging. Doppler waveforms show normal direction of venous flow, normal respiratory plasticity and response to augmentation. OTHER None. Limitations: none IMPRESSION: No lower extremity DVT. Electronically Signed   By: Elester Grim M.D.   On: 12/25/2023 17:29   CT CHEST WO CONTRAST Result Date: 12/25/2023 CLINICAL DATA:  Bilateral lower extremity edema. History of pancreatic cancer. EXAM: CT CHEST WITHOUT CONTRAST TECHNIQUE: Multidetector CT imaging of the chest was performed following the standard protocol without IV contrast. RADIATION DOSE REDUCTION: This exam was performed according to the departmental dose-optimization program which includes automated exposure control, adjustment of the mA and/or kV according to patient size and/or use of iterative reconstruction technique. COMPARISON:  August 21, 2023. FINDINGS: Cardiovascular: Atherosclerosis of thoracic aorta without aneurysm formation. Mild cardiomegaly. No pericardial effusion. Right internal jugular Port-A-Cath is noted. Mediastinum/Nodes: No enlarged mediastinal or axillary lymph nodes. Thyroid gland, trachea, and esophagus demonstrate no significant findings. Lungs/Pleura: No pneumothorax or pleural effusion is noted. Right lung is clear. 3.5 x 2.6 cm cavitating lesion is noted in left lower lobe which may represent cavitary pneumonia or possibly metastatic disease. Upper Abdomen: Interval development of multiple rounded low densities throughout the left and right hepatic lobes consistent with metastatic disease. Pancreatic mass is again noted consistent with history of pancreatic malignancy. Musculoskeletal: No chest wall mass or suspicious bone lesions identified.  IMPRESSION: Interval development of multiple rounded hepatic low densities consistent with metastatic disease. Pancreatic mass is again noted consistent with history of pancreatic malignancy. 3.5 x 2.6 cm cavitating lesion is noted in left lower lobe which may represent cavitary pneumonia or possibly metastatic disease. Electronically Signed   By: Rosalene Colon M.D.   On: 12/25/2023 13:53   CT ABDOMEN PELVIS W CONTRAST Result Date: 12/25/2023 CLINICAL DATA:  Sepsis. * Tracking Code: BO * EXAM: CT ABDOMEN AND PELVIS WITH CONTRAST TECHNIQUE: Multidetector CT imaging of the abdomen and pelvis was performed using the standard protocol following bolus administration of intravenous contrast. RADIATION DOSE REDUCTION: This exam was performed according to the departmental dose-optimization program which includes automated exposure control, adjustment of the mA and/or kV according to patient size and/or use of iterative reconstruction technique. CONTRAST:  OMNIPAQUE  IOHEXOL  300 MG/ML  SOLN COMPARISON:  CT scan abdomen and pelvis from 11/05/2023. FINDINGS: Lower chest: Since the prior study, there is a new thick-walled cavitary lesion in the left lung lower lobe measuring 2.4 x 2.8 cm. Visualized bilateral lung bases are otherwise clear. No pleural effusion. Normal heart size. No pericardial effusion. Hepatobiliary: The liver is normal in size. Non-cirrhotic configuration. Redemonstration of multiple (more than 25), hypoattenuating liver lesions with dominant lesion in the right hepatic lobe measuring up to 2.2 x 2.3 cm, which previously measured up to  1.5 x 1.8 cm. Findings are compatible with worsening metastases. No intrahepatic or extrahepatic bile duct dilation. Gallbladder is surgically absent. Pancreas: Redemonstration of ill-defined heterogeneous predominantly hypoattenuating mass centered within the pancreatic tail and distal body measuring approximately 4.2 x 7.4 cm, grossly unchanged since the prior study  and highly concerning for pancreatic neoplasm. Rest of the pancreas is unremarkable. Main pancreatic duct is not dilated. No peripancreatic fat stranding. 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 Spleen: Normal size spleen. Redemonstration of a hypoattenuating 2.2 x 2.6 cm lesion along the superolateral aspect, which has increased in size since the prior study. There is a probable new sub 5 mm subcapsular focus in the posterosuperior aspect, which is concerning for new metastases. Adrenals/Urinary Tract: Adrenal glands are unremarkable. No suspicious renal mass. No hydronephrosis. No renal or ureteric calculi. Unremarkable urinary bladder. Stomach/Bowel: No disproportionate dilation of the small or large bowel loops. No evidence of abnormal bowel wall thickening or inflammatory changes. The appendix is unremarkable. There are multiple diverticula mainly in the sigmoid colon, without imaging signs of diverticulitis. Vascular/Lymphatic: No ascites or pneumoperitoneum. No abdominal or pelvic lymphadenopathy, by size criteria. No aneurysmal dilation of the major abdominal arteries. There are mild peripheral atherosclerotic vascular calcifications of the aorta and its major branches. Reproductive: The uterus is surgically absent. No large adnexal mass. Other: Note is again made of thinned/lax anterior abdominal wall. There is a small fat containing right paramedian periumbilical hernia. The soft tissues and abdominal wall are otherwise unremarkable. Musculoskeletal: No suspicious osseous lesions. There are moderate multilevel degenerative changes in the visualized spine. Bilateral marked hip joint arthritis noted IMPRESSION: 1. Redemonstration of ill-defined heterogeneous hypoattenuating mass centered within the pancreatic tail and distal body, highly concerning for pancreatic neoplasm. 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 2. Redemonstration of multiple hypoattenuating liver lesions, which have increased in size since the prior study, compatible with worsening metastases. 3. There is a new thick-walled cavitary lesion in the left lung lower lobe measuring 2.4 x 2.8 cm. This is indeterminate and differential diagnosis includes metastases versus infectious/inflammatory etiology. Consider dedicated chest imaging. 4. Multiple other nonacute observations, as described above. Aortic Atherosclerosis (ICD10-I70.0). Electronically Signed   By: Beula Brunswick M.D.   On: 12/25/2023 11:37   DG Chest Port 1 View Result Date: 12/25/2023 CLINICAL DATA:  Suspected sepsis EXAM: PORTABLE CHEST 1 VIEW COMPARISON:  09/28/2023 FINDINGS: Numerous leads and wires project over the chest. Patient is rotated to the left. Mildly degraded exam due to AP portable technique and patient body habitus. Mild cardiomegaly. No right-sided pleural effusion. Left costophrenic angle is obscured by overlying breast tissue. IMPRESSION: Degraded exam secondary to AP portable technique and patient body habitus. Especially the inferior left hemithorax is poorly evaluated secondary to overlying soft tissues. Given this limitation, cardiomegaly and low lung volumes without acute disease. If there is a clinical concern of left lower lobe pneumonia, recommend PA and lateral radiographs. Electronically Signed   By: Lore Rode M.D.   On: 12/25/2023 10:51   IR IMAGING GUIDED PORT INSERTION Result Date: 12/17/2023 INDICATION: Port-A-Cath needed for treatment of metastatic pancreatic cancer. EXAM: FLUOROSCOPIC AND ULTRASOUND GUIDED PLACEMENT OF A SUBCUTANEOUS PORT MEDICATIONS: Fentanyl  50 mcg ANESTHESIA/SEDATION: Patient's level of consciousness and vital signs were monitored continuously by radiology nurse throughout the procedure under the supervision of the provider performing the procedure. FLUOROSCOPY TIME:  Radiation Exposure Index (as  provided by the fluoroscopic device): 17 mGy Kerma COMPLICATIONS: None immediate. PROCEDURE: The procedure, risks, benefits, and alternatives were explained to the patient. Questions regarding the procedure were encouraged and answered. The patient understands and consents to the procedure. Patient was placed supine on the interventional table. Ultrasound confirmed a patent right internal jugular vein. Ultrasound image was saved for documentation. The right chest and neck were cleaned with a skin antiseptic and a sterile drape was placed. Maximal barrier sterile technique was utilized including caps, mask, sterile gowns, sterile gloves, sterile drape, hand hygiene and skin antiseptic. The right neck was anesthetized with 1% lidocaine . Small incision was made in the right neck with a blade. Micropuncture set was placed in the right internal jugular vein with ultrasound guidance. The micropuncture wire was used for measurement purposes. The right chest was anesthetized with 1% lidocaine  with epinephrine . #15 blade was used to make an incision and a subcutaneous port pocket was formed. 8 french Power Port was assembled. Subcutaneous tunnel was formed with a stiff tunneling device. The port catheter was brought through the subcutaneous tunnel. The port was placed in the subcutaneous pocket. The micropuncture set was exchanged for a peel-away sheath. The catheter was placed through the peel-away sheath and the tip was positioned at the superior cavoatrial junction. Catheter placement was confirmed with fluoroscopy. The port was accessed and flushed with heparinized saline. The port pocket was closed using two layers of absorbable sutures and Dermabond. The vein skin site was closed using a single layer of absorbable suture and Dermabond. Sterile dressings were applied. Patient tolerated the procedure well without an immediate complication. Ultrasound and fluoroscopic images were taken and saved for this procedure.  IMPRESSION: Placement of a subcutaneous power-injectable port device. Catheter tip at the superior cavoatrial junction. Electronically Signed   By: Elene Griffes M.D.   On: 12/17/2023 13:13    PERFORMANCE STATUS (ECOG) : 2 - Symptomatic, <50% confined to bed  Review of Systems Unless otherwise noted, a complete review of systems is negative.  Physical Exam General: NAD Cardiovascular: regular rate and rhythm Pulmonary: clear ant fields Abdomen: soft, nontender, + bowel sounds GU: no suprapubic tenderness Extremities: no edema, no joint deformities Skin: no rashes Neurological: Weakness but otherwise nonfocal  IMPRESSION: Patient is known to me from her previous hospitalization and our outpatient clinic.  She was admitted with sepsis with hospitalization complicated by a flutter.  Patient status post TEE and cardioversion.  She has been seen by inpatient palliative care team.  I followed up with patient today to address goals.  She clearly expresses a desire to continue chemotherapy and cancer treatments.  Of note, patient reversed her CODE STATUS during this hospitalization.  Patient tells me that she has discussed with her family and now would desire resuscitation.  We spoke frankly today regarding the almost certain futility associated with resuscitative efforts in the setting of terminal cancer.  Patient states that she will think more about the subject and would be willing to revisit should her condition decline.  However, for now she wishes to remain a full code.  Symptomatically, she has occasional nausea.  She states that ondansetron  8 mg tablets is less effective than 4 mg ODT.  She requests that I prescribe the latter for her outpatient use.  Additionally, patient requests an appetite stimulant.  Patient also states that she is having some difficulty sleeping at night.  Will trial low-dose olanzapine, which may help with both nausea, appetite,  and sleep.   PLAN: - Continue current  scope of treatment -Zofran  4 mg ODT every 8 as needed for nausea -Olanzapine 5 mg nightly for nausea/appetite -Will plan outpatient follow-up   Patient expressed understanding and was in agreement with this plan. She also understands that She can call the clinic at any time with any questions, concerns, or complaints.     Time Total: 20 minutes  Visit consisted of counseling and education dealing with the complex and emotionally intense issues of symptom management and palliative care in the setting of serious and potentially life-threatening illness.Greater than 50%  of this time was spent counseling and coordinating care related to the above assessment and plan.  Signed by: Gerilyn Kobus, PhD, NP-C

## 2023-12-29 NOTE — Consult Note (Signed)
 PHARMACY CONSULT NOTE - ELECTROLYTES  Pharmacy Consult for Electrolyte Monitoring and Replacement   Recent Labs: Height: 5\' 2"  (157.5 cm) Weight: 112.6 kg (248 lb 3.8 oz) IBW/kg (Calculated) : 50.1 Estimated Creatinine Clearance: 70 mL/min (by C-G formula based on SCr of 0.95 mg/dL). Potassium (mmol/L)  Date Value  12/29/2023 3.9  04/10/2014 3.4 (L)   Magnesium  (mg/dL)  Date Value  13/24/4010 2.1   Calcium  (mg/dL)  Date Value  27/25/3664 7.9 (L)   Calcium , Total (mg/dL)  Date Value  40/34/7425 8.8   Albumin  (g/dL)  Date Value  95/63/8756 2.3 (L)  04/10/2014 3.1 (L)   Phosphorus (mg/dL)  Date Value  43/32/9518 2.5   Sodium (mmol/L)  Date Value  12/29/2023 138  04/10/2014 143    Assessment  Kristina Cruz is a 63 y.o. female presenting with weakness. PMH significant for stage 5 pancreatic cancer with liver metastasis on chemotherapy, T2DM, HTN, COPD, asthma. Pharmacy has been consulted to monitor and replace electrolytes.  Diet: NPO MIVF: N/A Pertinent medications: N/A  Goal of Therapy: Electrolytes WNL  Plan:  No replacement currently warranted Check BMP, Mg, Phos with AM labs  Thank you for allowing pharmacy to be a part of this patient's care.  Almando Brawley Rodriguez-Guzman PharmD, BCPS 12/29/2023 7:34 AM

## 2023-12-30 ENCOUNTER — Telehealth: Payer: Self-pay | Admitting: Oncology

## 2023-12-30 ENCOUNTER — Other Ambulatory Visit: Payer: Self-pay | Admitting: Oncology

## 2023-12-30 DIAGNOSIS — R6520 Severe sepsis without septic shock: Secondary | ICD-10-CM | POA: Diagnosis not present

## 2023-12-30 DIAGNOSIS — A419 Sepsis, unspecified organism: Secondary | ICD-10-CM | POA: Diagnosis not present

## 2023-12-30 LAB — CBC
HCT: 25 % — ABNORMAL LOW (ref 36.0–46.0)
Hemoglobin: 8.4 g/dL — ABNORMAL LOW (ref 12.0–15.0)
MCH: 28.6 pg (ref 26.0–34.0)
MCHC: 33.6 g/dL (ref 30.0–36.0)
MCV: 85 fL (ref 80.0–100.0)
Platelets: 189 10*3/uL (ref 150–400)
RBC: 2.94 MIL/uL — ABNORMAL LOW (ref 3.87–5.11)
RDW: 16.1 % — ABNORMAL HIGH (ref 11.5–15.5)
WBC: 19.4 10*3/uL — ABNORMAL HIGH (ref 4.0–10.5)
nRBC: 4.5 % — ABNORMAL HIGH (ref 0.0–0.2)

## 2023-12-30 LAB — CULTURE, BLOOD (ROUTINE X 2)
Culture: NO GROWTH
Culture: NO GROWTH
Special Requests: ADEQUATE

## 2023-12-30 LAB — GLUCOSE, CAPILLARY
Glucose-Capillary: 109 mg/dL — ABNORMAL HIGH (ref 70–99)
Glucose-Capillary: 163 mg/dL — ABNORMAL HIGH (ref 70–99)

## 2023-12-30 LAB — BASIC METABOLIC PANEL WITH GFR
Anion gap: 7 (ref 5–15)
BUN: 12 mg/dL (ref 8–23)
CO2: 22 mmol/L (ref 22–32)
Calcium: 7.9 mg/dL — ABNORMAL LOW (ref 8.9–10.3)
Chloride: 107 mmol/L (ref 98–111)
Creatinine, Ser: 1.04 mg/dL — ABNORMAL HIGH (ref 0.44–1.00)
GFR, Estimated: 60 mL/min — ABNORMAL LOW (ref >60)
Glucose, Bld: 102 mg/dL — ABNORMAL HIGH (ref 70–99)
Potassium: 3.6 mmol/L (ref 3.5–5.1)
Sodium: 136 mmol/L (ref 135–145)

## 2023-12-30 MED ORDER — POTASSIUM CHLORIDE CRYS ER 20 MEQ PO TBCR
20.0000 meq | EXTENDED_RELEASE_TABLET | Freq: Once | ORAL | Status: AC
  Administered 2023-12-30: 20 meq via ORAL
  Filled 2023-12-30: qty 1

## 2023-12-30 MED ORDER — APIXABAN 5 MG PO TABS: 5.0000 mg | ORAL_TABLET | Freq: Two times a day (BID) | ORAL | 0 refills | Status: DC

## 2023-12-30 MED ORDER — METOPROLOL TARTRATE 25 MG PO TABS: 25.0000 mg | ORAL_TABLET | Freq: Two times a day (BID) | ORAL | 0 refills | Status: AC

## 2023-12-30 NOTE — Consult Note (Signed)
 PHARMACY CONSULT NOTE - ELECTROLYTES  Pharmacy Consult for Electrolyte Monitoring and Replacement   Recent Labs: Height: 5\' 2"  (157.5 cm) Weight: 112.6 kg (248 lb 3.8 oz) IBW/kg (Calculated) : 50.1 Estimated Creatinine Clearance: 63.9 mL/min (A) (by C-G formula based on SCr of 1.04 mg/dL (H)). Potassium (mmol/L)  Date Value  12/30/2023 3.6  04/10/2014 3.4 (L)   Magnesium  (mg/dL)  Date Value  16/05/9603 2.1   Calcium  (mg/dL)  Date Value  54/04/8118 7.9 (L)   Calcium , Total (mg/dL)  Date Value  14/78/2956 8.8   Albumin  (g/dL)  Date Value  21/30/8657 2.3 (L)  04/10/2014 3.1 (L)   Phosphorus (mg/dL)  Date Value  84/69/6295 2.5   Sodium (mmol/L)  Date Value  12/30/2023 136  04/10/2014 143    Assessment  Kristina Cruz is a 63 y.o. female presenting with weakness. PMH significant for stage 5 pancreatic cancer with liver metastasis on chemotherapy, T2DM, HTN, COPD, asthma. Pharmacy has been consulted to monitor and replace electrolytes.  Diet: regula diet MIVF: N/A Pertinent medications: N/A  Goal of Therapy: Electrolytes WNL  Plan:  K within normal limits but drop since yesterday. Patient now on regular diet and off Abx. Will give Kcl po 20meq x 1 dose to keep K optimized for cardiac indication. Check BMP, Mg, Phos with AM labs  Thank you for allowing pharmacy to be a part of this patient's care.  Dohn Stclair Rodriguez-Guzman PharmD, BCPS 12/30/2023 11:27 AM

## 2023-12-30 NOTE — Plan of Care (Signed)
  Problem: Fluid Volume: Goal: Hemodynamic stability will improve Outcome: Progressing   Problem: Clinical Measurements: Goal: Diagnostic test results will improve Outcome: Progressing Goal: Signs and symptoms of infection will decrease Outcome: Progressing   Problem: Respiratory: Goal: Ability to maintain adequate ventilation will improve Outcome: Progressing   Problem: Education: Goal: Knowledge of General Education information will improve Description: Including pain rating scale, medication(s)/side effects and non-pharmacologic comfort measures Outcome: Progressing   Problem: Health Behavior/Discharge Planning: Goal: Ability to manage health-related needs will improve Outcome: Progressing   Problem: Clinical Measurements: Goal: Ability to maintain clinical measurements within normal limits will improve Outcome: Progressing Goal: Will remain free from infection Outcome: Progressing Goal: Diagnostic test results will improve Outcome: Progressing Goal: Respiratory complications will improve Outcome: Progressing Goal: Cardiovascular complication will be avoided Outcome: Progressing   Problem: Activity: Goal: Risk for activity intolerance will decrease Outcome: Progressing

## 2023-12-30 NOTE — Discharge Instructions (Signed)
 Eating enough calories and protein and drinking enough fluid can be challenging during cancer treatment. Your body uses calories and protein for fuel to support healthy organs, muscle repair, and daily activity. Often your body will need more calories and protein to heal tissues and fight infections during cancer treatment. Low appetite, fatigue, and treatment-related side effects can make it difficult to prepare foods and find foods that sound appealing. There are simple ways to encourage adequate intake that will help to provide the nutrition you need during treatment.  Tips Issue Nutritional Considerations  Low appetite Eat 6 to 8 small meals and snacks throughout the day Schedule your mealtimes instead of waiting to be hungry Do not skip meals Carry snacks with you so that food is always available Eat a bigger meal at the time of day when your appetite is largest Choose milkshakes, smoothies or high-calorie, high-protein nutritional drinks if you don't feel like eating a meal  Feeling full quickly Eat 6 to 8 small meals and snacks throughout the day instead of 3 large meals Avoid drinking large amounts of fluids with meals, rather sip on fluids throughout the day Make every bite count by choosing high-calorie and high-protein foods when possible  Difficulty eating meats Try ground or chopped meats mixed into a sauce or in a soup or casserole Use other protein sources including eggs, milk, cheese, yogurt, beans, cottage cheese, nuts and nut butter, and tofu Include a protein source at each meal and snack Drink high-calorie, high-protein nutritional drinks to help boost your protein intake  Taste changes Try enhancing the flavor of foods with herbs, spices, seasoning blends, marinades, and sauces Season foods with tart flavors such as citrus fruits, vinegar-based dressings, and pickled foods. You may need to avoid these foods if you are experiencing sore mouth or throat. Add sugar to improve  the flavor of salty foods Add salt to decrease the sweetness of sugary foods Use lemon juice on foods to mask metallic taste Use plastic utensils if you experience a metallic taste Practice good oral hygiene Rinse mouth with baking soda and saltwater rinse (1 quart water mixed with  teaspoon of salt and 1 teaspoon of baking soda) before and after meals to cleanse the tastebuds  Nausea and vomiting Attempt to eat small, frequent meals Choose bland, starchy foods and clear liquids, all served at room temperature Avoid greasy, high-fat foods and highly seasoned foods Drink liquids between meals rather than with meals Limit exposure to food odors by avoiding food preparation areas and using exhaust fans or opening windows Time meals to when nausea medications are most effective  Weight loss Increase the calories in your foods: Use milk instead of water for soups and cooked cereals Add butter or oil to potatoes, rice, pasta, vegetables, sauces, soups, or casseroles Spread butter or oil on bread for sandwiches Use mayonnaise or salad dressing on sandwiches and in dips or sauces Add gravy or buttery sauces to meats and vegetables Snack on avocado, guacamole, nuts, nut butter, olives or hummus Add extra protein to soups by slicing cooked egg white into the soup  Dehydration Drink 8-10 cups of fluid per day Use cues, like a timer, as a reminder to have something to drink Drink small amounts of fluid as often as possible Keep a filled water bottle nearby at all times Try flavored waters, milks, sports drinks, fruits juice, caffeine -free soft drinks, and other beverages to increase the variety Eat foods that are high in fluids like fresh fruit, broth,  soups, gelatin, fruit ice, popsicles, ice cream, sorbet, sherbet, milkshakes, and high-calorie/high-protein nutritional drinks  Fatigue makes it difficult to prepare meals Choose foods that are ready to eat or easy to make to minimize time for food  preparation including pudding cups, peanut butter, tuna, cereal bars, trail mix, cheese and crackers and eggs Use ready-to-drink high calorie/high protein nutritional drinks Make extra food and freeze the leftovers when you are able Keep meal preparation simple by using prepared and frozen foods Accept offers from family and friends to prepare meals or grocery shop for you Let your friends and family who help with meal preparation know your food preferences and needs   Easy to Prepare High-Calorie, High-Protein Sample 1-Day Menu View Nutrient Info Breakfast 1 frozen waffle, toasted 2 tablespoons peanut butter 1 banana, sliced 1 cup whole milk  Morning Snack  cup yogurt  cup strawberries  cup granola  Lunch Tuna salad sandwich made with: 2 slices bread, whole wheat 3 ounces tuna 1 tablespoon mayonnaise, mixed with tuna 1 slice of cheese One 3.5-ounce, ready-to-eat pudding cup  Afternoon Snack 1 stick string cheese 5 whole wheat crackers  Evening Meal 1 individually portioned frozen chicken pot pie, baked  cup of peas, cooked 1 teaspoon margarine, for peas  Evening Snack 1 cup high-calorie, high-protein nutrition drink 1 chocolate chip cookie  Copyright 2025  [employer]. All rights reserved.

## 2023-12-30 NOTE — Progress Notes (Addendum)
 Nutrition Follow-up  DOCUMENTATION CODES:   Morbid obesity  INTERVENTION:   -Continue regular diet -Continue MVI with minerals daily -Continue 500 mg vitamin C BID -Continue 220 mg zinc sulfate daily x 14 days -Continue Magic cup TID with meals, each supplement provides 290 kcal and 9 grams of protein  -Continue Ensure Enlive po TID, each supplement provides 350 kcal and 20 grams of protein.  -Continue 30 ml Prosource Plus TID, each supplement provides 100 kcals and 15 grams protein -RD provided "Maximizing Nutrition During Cancer Treatment" handout from [facility] Nutrition Care Manual; attached to AVS/ discharge summary; also discussed with [facility] RD for continuity of Care  NUTRITION DIAGNOSIS:   Increased nutrient needs related to cancer and cancer related treatments as evidenced by estimated needs.  ONgoing  GOAL:   Patient will meet greater than or equal to 90% of their needs  Progressing   MONITOR:   PO intake, Supplement acceptance  REASON FOR ASSESSMENT:   Consult Assessment of nutrition requirement/status, Poor PO  ASSESSMENT:   Pt with medical history significant of Stage V pancreatic cancer with liver metastasis on chemotherapy, uncontrolled type 2 diabetes, hypertension, COPD and asthma, morbid obesity, who presents due to weakness  5/12- s/p TEE with cardioversion   Reviewed I/O's: +851 ml x 24 hours and +9 L since admission  Spoke with pt at bedside, who reports feeling a little better today. Pt sitting up on the side of the bed, states she is ready for breakfast. Noted meal completions documented at 0% and pt with variable acceptance for supplements. Pt reports that she has an appetite, but it is difficult to eat due to taste changes. Per pt, "everything tastes like cardboard" and she feels like she has to "choke down food" for nutrition. Pt admits the largest barrier is that she has little desire to eat as she does not get any pleasure from it.    Pt reports that she has tried Ensure supplements in the past, but cannot afford routinely secondary to financial restraints. Pt reports she likes the Banana flavored Premier Protein. RD discussed importance of a high calorie, high protein supplement (such as Ensure or [employer] Equate Brand) to better help pt meet her calorie needs. RD provided pt with Ensure coupons. Pt expressed appreciation for visit.   Pt reports [facility] RD had reached out to her previously, but she felt too sick to meet with her at that time. She gave RD permission to reach out to [facility] RD for additional layer of support at discharge.   Per palliative care notes, pt desires full scope treatment at this time.   Pt shares that she is hopeful to discharge home today with pre-existing home health services.   Labs reviewed: CBGS: 98-140 (inpatient orders for glycemic control are none).    Diet Order:   Diet Order             Diet regular Fluid consistency: Thin  Diet effective now                   EDUCATION NEEDS:   No education needs have been identified at this time  Skin:  Skin Assessment: Skin Integrity Issues: Skin Integrity Issues:: Stage III Stage III: sacrum  Last BM:  Unknown  Height:   Ht Readings from Last 1 Encounters:  12/25/23 5\' 2"  (1.575 m)    Weight:   Wt Readings from Last 1 Encounters:  12/25/23 112.6 kg    Ideal Body  Weight:  50 kg  BMI:  Body mass index is 45.4 kg/m.  Estimated Nutritional Needs:   Kcal:  1800-2000  Protein:  100-115 grams  Fluid:  1.8-2.0 L    Herschel Lords, RD, LDN, CDCES Registered Dietitian III Certified Diabetes Care and Education Specialist If unable to reach this RD, please use "RD Inpatient" group chat on secure chat between hours of 8am-4 pm daily

## 2023-12-30 NOTE — Telephone Encounter (Signed)
 MD updated appts in IS and 01/01/24 was changed to 01/08/24. I spoke with pt and let her know the update. Pt understood.

## 2023-12-30 NOTE — Discharge Summary (Addendum)
 Physician Discharge Summary  Kristina Cruz ZOX:[id number] DOB: 08-23-1960 DOA: 12/25/2023  PCP: Chucky Craver, FNP  Admit date: 12/25/2023 Discharge date: 12/30/2023  Admitted From: home  Disposition:  home w/ home health   Recommendations for Outpatient Follow-up:  Follow up with PCP in 1-2 weeks F/u w/ onco at previously scheduled appt 3.   F/u w/ cardio, Dr. Gollan, in 2-3 weeks   Home Health: yes  Equipment/Devices: wheelchair   Discharge Condition: stable  CODE STATUS: full  Diet recommendation: regular   Brief/Interim Summary: HPI was taken from Dr. Guss Legacy: Kristina Cruz is a 63 y.o. female with medical history significant of Stage V pancreatic cancer with liver metastasis on chemotherapy, uncontrolled type 2 diabetes, hypertension, COPD and asthma, morbid obesity, who presents to the ED due to weakness.   Ms. Tan states that for the last 1 week, she has been experiencing diarrhea and nausea with vomiting every day.  She notes that her vomiting is mostly clear, but her diarrhea is intermittently melanotic.  Her most recent diarrheal episode was normal brown though.  Then in the last 1 day, she has developed a productive cough but denies any worsening in her breathing.  She denies any known fevers but feels very cold.  She denies chest pain, abdominal pain.  She also notes lower extremity swelling that seems worse than usual.   Per chart review, patient presented to her outpatient oncology appointment today and was noted to be tachycardic.  Labs were drawn and concerning for AKI, severe leukocytosis.  She was sent urgently to the ER at that time.   Last infusion of paclitaxel  and gemcitabine  was on December 11, 2023.   ED course: On arrival to the ED, patient was initially normotensive at 107/76 but subsequently became hypotensive at 77/66.  She was tachycardic at 150.  She was saturating at 96% on room air with respiratory rate up to 24.  She was afebrile 97.6.  Initial  workup notable for WBC 24.6, hemoglobin 10.7, potassium 3.0, glucose 219, creatinine 1.18, GFR 51.  BNP 109, troponin 12 and then 14.  Lactic acid 1.6 and then 2.0.  CT chest and abdomen were obtained, notable for new cavitary lesion in the left lower lobe measuring 2.4 x 2.8 cm concerning for infection versus metastasis, in addition to known pancreatic and liver malignancy.  Patient was started on cefepime, vancomycin , Flagyl, IV fluids.  TR[facility] contacted for admission.    Discharge Diagnoses:  Principal Problem:   Severe sepsis (HCC) Active Problems:   Gastroenteritis   Cavitary lesion of lung   Acute heart failure (HCC)   AKI (acute kidney injury) (HCC)   Pancreatic cancer metastasized to liver (HCC)   Type 2 diabetes mellitus (HCC)   Chronic obstructive pulmonary disease (COPD) (HCC)   Typical atrial flutter (HCC)   Atrial fibrillation with RVR (HCC)   Morbid obesity (HCC)   Pressure injury of skin   Persistent atrial fibrillation (HCC)  Severe sepsis: met criteria w/ tachycardia, hypotension, leukocytosis & w/ suspected sources being either GI versus pulmonary. Has been experiencing diarrhea/vomiting for 1 week and a new productive cough. Blood cxs NGTD. Abxs were d/c on 12/29/23. Exact source was not found    Atrial flutter: with RVR. S/p TEE & cardioverison on 12/28/23 which was successful. Continue on metoprolol, eliquis as per cardio    Loss of taste and appetite: likely due to chemo.  Lower extremity edema: US  of b/l LE neg for DVT. Pt refused CTA chest b/c  she could not lie flat. 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 May be new metastasis of her known stage IV pancreatic cancer.   Gastroenteritis: etiology unclear, possibly secondary to chemo. Resolved    AKI: Cr is labile. Will continue to monitor    Discharge Instructions  Discharge Instructions     Diet - low sodium heart healthy   Complete by: As directed    Discharge instructions   Complete by: As directed    F/u w/ PCP in 1-2 weeks. F/u w/ cardio, Dr. Gollan, in 2-3 weeks. F/u w/ onco at previously scheduled appt   Discharge wound care:   Complete by: As directed    Wound care  Daily      Comments: Cleanse sacral/buttocks wounds with NS, apply Medihoney to wound beds daily, cover with dry gauze and silicone foam.   Increase activity slowly   Complete by: As directed       Allergies as of 12/30/2023       Reactions   Peanut-containing Drug Products         Medication List     STOP taking these medications    amLODipine  10 MG tablet Commonly known as: NORVASC    ondansetron  8 MG tablet Commonly known as: Zofran        TAKE these medications    acetaminophen  650 MG CR tablet Commonly known as: Tylenol  8 Hour Arthritis Pain Take 3 tablets (1,950 mg total) by mouth every 8 (eight) hours as needed for pain. Do not exceed 4000 mg total in a day.  Home med.   apixaban 5 MG Tabs tablet Commonly known as: ELIQUIS Take 1 tablet (5 mg total) by mouth 2 (two) times daily.   azelastine 0.1 % nasal spray Commonly known as: ASTELIN Place 1 spray into both nostrils 2 (two) times daily.   diphenoxylate -atropine  2.5-0.025 MG tablet Commonly known as: LOMOTIL  Take 2 tablets by mouth every 6 (six) hours as needed (for diarrhea unrelieved by imodium).   EPINEPHrine  0.3 mg/0.3 mL Soaj injection Commonly known as: EPI-PEN Inject 0.3 mg into the muscle as needed for anaphylaxis.   fluticasone  50 MCG/ACT nasal spray Commonly known as: FLONASE  Place 1 spray into both nostrils daily.   fluticasone  furoate-vilanterol 100-25 MCG/ACT Aepb Commonly known as: Breo Ellipta  Inhale 1 puff into the lungs daily.   furosemide  40 MG tablet Commonly known as: LASIX  Take 40 mg by mouth every other day.   gabapentin  100 MG  capsule Commonly known as: Neurontin  Take 1 capsule (100 mg total) by mouth 3 (three) times daily.   ipratropium-albuterol  0.5-2.5 (3) MG/3ML Soln Commonly known as: DUONEB Take 3 mLs by nebulization every 4 (four) hours as needed (wheezing / shortness of breath).   levocetirizine 5 MG tablet Commonly known as: XYZAL Take 5 mg by mouth daily.   magic mouthwash (lidocaine , diphenhydrAMINE , alum & mag hydroxide) suspension Swish and swallow 5 mLs 4 (four) times daily as needed for mouth pain.   metFORMIN  1000 MG tablet Commonly known as: GLUCOPHAGE  Take 1,000 mg by mouth 2 (two) times daily.   metoprolol tartrate 25 MG tablet Commonly known as: LOPRESSOR Take 1 tablet (25 mg total) by mouth 2 (two) times daily.   morphine  15 MG 12 hr tablet Commonly known as: MS CONTIN  Take 1 tablet (15 mg  total) by mouth every 12 (twelve) hours.   naloxone  4 MG/0.1ML Liqd nasal spray kit Commonly known as: NARCAN  SPRAY 1 SPRAY INTO ONE NOSTRIL AS DIRECTED FOR OPIOID OVERDOSE (TURN PERSON ON SIDE AFTER DOSE. IF NO RESPONSE IN 2-3 MINUTES OR PERSON RESPONDS BUT RELAPSES, REPEAT USING A NEW SPRAY DEVICE AND SPRAY INTO THE OTHER NOSTRIL. CALL 911 AFTER USE.) * EMERGENCY USE ONLY *   ondansetron  4 MG disintegrating tablet Commonly known as: ZOFRAN -ODT Take 1 tablet (4 mg total) by mouth every 8 (eight) hours as needed for nausea or vomiting.   oxyCODONE  5 MG immediate release tablet Commonly known as: Oxy IR/ROXICODONE  Take 1 tablet (5 mg total) by mouth every 4 (four) hours as needed for moderate pain (pain score 4-6) or severe pain (pain score 7-10).   potassium chloride  SA 20 MEQ tablet Commonly known as: KLOR-CON  M Take 1 tablet (20 mEq total) by mouth daily.   prochlorperazine  10 MG tablet Commonly known as: COMPAZINE  Take 1 tablet (10 mg total) by mouth every 6 (six) hours as needed for nausea or vomiting.   rOPINIRole  2 MG tablet Commonly known as: REQUIP  Take 2 mg by mouth in the  morning and at bedtime.   Semaglutide (1 MG/DOSE) 4 MG/3ML Sopn Inject 1 mg into the skin once a week.   Ventolin  HFA 108 (90 Base) MCG/ACT inhaler Generic drug: albuterol  Inhale 2 puffs into the lungs every 4 (four) hours as needed for wheezing or shortness of breath.               Durable Medical Equipment  (From admission, onward)           Start     Ordered   12/30/23 1137  For home use only DME lightweight manual wheelchair with seat cushion  Once       Comments: Patient suffers from generalized weakness, pancreatic cancer which impairs their ability to perform daily activities like bathing, dressing, feeding, grooming, and toileting in the home.  A cane or walker will not resolve  issue with performing activities of daily living. A wheelchair will allow patient to safely perform daily activities. Patient is not able to propel themselves in the home using a standard weight wheelchair due to endurance and general weakness. Patient can self propel in the lightweight wheelchair. Length of need Lifetime. Accessories: elevating leg rests (ELRs), wheel locks, extensions and anti-tippers.  Need a heavy duty wheelchair or bariatric wheelchair   12/30/23 1137              Discharge Care Instructions  (From admission, onward)           Start     Ordered   12/30/23 0000  Discharge wound care:       Comments: Wound care  Daily      Comments: Cleanse sacral/buttocks wounds with NS, apply Medihoney to wound beds daily, cover with dry gauze and silicone foam.   12/30/23 1307            Allergies  Allergen Reactions   Peanut-Containing Drug Products     Consultations: Cardio    Procedures/Studies: DG HIPS BILAT WITH PELVIS 3-4 VIEWS Result Date: 12/29/2023 CLINICAL DATA:  Bilateral hip pain. EXAM: DG HIP (WITH OR WITHOUT PELVIS) 3-4V BILAT COMPARISON:  None Available. FINDINGS: There is no evidence of hip fracture or dislocation. Severe narrowing and  osteophyte formation is seen involving both hips. 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 No acute abnormality seen.  Electronically Signed   By: Rosalene Colon M.D.   On: 12/29/2023 17:29   ECHO TEE Result Date: 12/28/2023    TRANSESOPHOGEAL ECHO REPORT   Patient Name:   Kristina Cruz Date of Exam: 12/28/2023 Medical Rec #:  [id number]          Height:       62.0 in Accession #:    [id number]         Weight:       248.2 lb Date of Birth:  [birth year]          BSA:          2.096 m Patient Age:    63 years           BP:           83/54 mmHg Patient Gender: F                  HR:           85 bpm. Exam Location:  [facility] Procedure: Transesophageal Echo, Color Doppler and Cardiac Doppler (Both            Spectral and Color Flow Doppler were utilized during procedure). Indications:     Atrial Fibrillation I48.91  History:         Patient has prior history of Echocardiogram examinations, most                  recent 12/26/2023. Risk Factors:Diabetes and Hypertension. GERD.  Sonographer:     Broadus Canes Referring Phys:  [id number] Eugene Hertz BERGE Diagnosing Phys: Constancia Delton MD PROCEDURE: The transesophogeal probe was passed without difficulty through the esophogus of the patient. Sedation performed by different physician. The patient developed no complications during the procedure.  IMPRESSIONS  1. Left ventricular ejection fraction, by estimation, is 55 to 60%. The left ventricle has normal function.  2. Right ventricular systolic function is normal. The right ventricular size is normal.  3. No left atrial/left atrial appendage thrombus was detected.  4. The mitral valve is normal in structure. Trivial mitral valve regurgitation.  5. Tricuspid valve regurgitation is mild to moderate.  6. The aortic valve is tricuspid. Aortic valve regurgitation is not visualized. FINDINGS  Left Ventricle: Left ventricular ejection fraction, by estimation, is 55 to 60%. The left ventricle has normal  function. The left ventricular internal cavity size was normal in size. Right Ventricle: The right ventricular size is normal. No increase in right ventricular wall thickness. Right ventricular systolic function is normal. Left Atrium: Left atrial size was normal in size. No left atrial/left atrial appendage thrombus was detected. Right Atrium: Right atrial size was normal in size. Pericardium: There is no evidence of pericardial effusion. Mitral Valve: The mitral valve is normal in structure. Trivial mitral valve regurgitation. Tricuspid Valve: The tricuspid valve is normal in structure. Tricuspid valve regurgitation is mild to moderate. Aortic Valve: The aortic valve is tricuspid. Aortic valve regurgitation is not visualized. Pulmonic Valve: The pulmonic valve was grossly normal. Pulmonic valve regurgitation is trivial. Aorta: The aortic root is normal in size and structure. IAS/Shunts: No atrial level shunt detected by color flow Doppler. Constancia Delton MD Electronically signed by Constancia Delton MD Signature Date/Time: 12/28/2023/4:39:22 PM    Final    ECHOCARDIOGRAM COMPLETE Result Date: 12/26/2023    ECHOCARDIOGRAM REPORT   Patient Name:   Kristina Cruz Date of Exam: 12/26/2023 Medical Rec #:  [id number]  Height:       62.0 in Accession #:    [id number]         Weight:       248.2 lb Date of Birth:  [birth year]          BSA:          2.096 m Patient Age:    63 years           BP:           91/66 mmHg Patient Gender: F                  HR:           111 bpm. Exam Location:  [facility] Procedure: 2D Echo (Both Spectral and Color Flow Doppler were utilized during            procedure). Indications:     CHF I50.31  History:         Patient has prior history of Echocardiogram examinations, most                  recent 02/12/2023.  Sonographer:     Clenton Czech RDCS, FASE Referring Phys:  [id number] Avi Body Diagnosing Phys: Belva Boyden MD  Sonographer Comments: Technically challenging study due  to limited acoustic windows, suboptimal apical window and patient is obese. Image acquisition challenging due to patient body habitus and Image acquisition challenging due to uncooperative patient. The patient declined Definity  IV ultrasound imaging agent. IMPRESSIONS  1. Left ventricular ejection fraction, by estimation, is 55 to 60%. Left ventricular ejection fraction by PLAX is 51 %. The left ventricle has normal function. The left ventricle has no regional wall motion abnormalities. There is moderate left ventricular hypertrophy. Left ventricular diastolic parameters are indeterminate.  2. Right ventricular systolic function is mildly reduced. The right ventricular size is normal. Tricuspid regurgitation signal is inadequate for assessing PA pressure.  3. The mitral valve is normal in structure. No evidence of mitral valve regurgitation. No evidence of mitral stenosis.  4. The aortic valve is normal in structure. Aortic valve regurgitation is not visualized. No aortic stenosis is present.  5. The inferior vena cava is normal in size with greater than 50% respiratory variability, suggesting right atrial pressure of 3 mmHg. FINDINGS  Left Ventricle: Left ventricular ejection fraction, by estimation, is 55 to 60%. Left ventricular ejection fraction by PLAX is 51 %. The left ventricle has normal function. The left ventricle has no regional wall motion abnormalities. Strain was performed and the global longitudinal strain is indeterminate. The left ventricular internal cavity size was normal in size. There is moderate left ventricular hypertrophy. Left ventricular diastolic parameters are indeterminate. Right Ventricle: The right ventricular size is normal. No increase in right ventricular wall thickness. Right ventricular systolic function is mildly reduced. Tricuspid regurgitation signal is inadequate for assessing PA pressure. Left Atrium: Left atrial size was normal in size. Right Atrium: Right atrial size was  normal in size. Pericardium: There is no evidence of pericardial effusion. Mitral Valve: The mitral valve is normal in structure. No evidence of mitral valve regurgitation. No evidence of mitral valve stenosis. Tricuspid Valve: The tricuspid valve is normal in structure. Tricuspid valve regurgitation is not demonstrated. No evidence of tricuspid stenosis. Aortic Valve: The aortic valve is normal in structure. Aortic valve regurgitation is not visualized. No aortic stenosis is present. Pulmonic Valve: The pulmonic valve was normal in structure. Pulmonic valve regurgitation is not visualized. No evidence of  pulmonic stenosis. Aorta: The aortic root is normal in size and structure. Venous: The inferior vena cava is normal in size with greater than 50% respiratory variability, suggesting right atrial pressure of 3 mmHg. IAS/Shunts: No atrial level shunt detected by color flow Doppler. Additional Comments: 3D was performed not requiring image post processing on an independent workstation and was indeterminate.  LEFT VENTRICLE PLAX 2D LV EF:         Left ventricular ejection fraction by PLAX is 51 %. LVIDd:         4.60 cm LVIDs:         3.40 cm LV PW:         1.40 cm LV IVS:        1.40 cm LVOT diam:     1.90 cm LVOT Area:     2.84 cm  LEFT ATRIUM         Index LA diam:    3.30 cm 1.57 cm/m                        PULMONIC VALVE AORTA                 PV Vmax:       0.98 m/s Ao Root diam: 3.90 cm PV Peak grad:  3.8 mmHg Ao Asc diam:  3.00 cm  MITRAL VALVE               TRICUSPID VALVE MV Area (PHT): 4.46 cm    TR Peak grad:   13.0 mmHg MV Decel Time: 170 msec    TR Vmax:        180.00 cm/s MV E velocity: 98.50 cm/s MV A velocity: 68.70 cm/s  SHUNTS MV E/A ratio:  1.43        Systemic Diam: 1.90 cm Belva Boyden MD Electronically signed by Belva Boyden MD Signature Date/Time: 12/26/2023/4:06:59 PM    Final    US  Venous Img Lower Bilateral (DVT) Result Date: 12/25/2023 CLINICAL DATA:  Lower extremity edema for 2 weeks  EXAM: Bilateral Lower Extremity Venous Doppler Ultrasound TECHNIQUE: Gray-scale sonography with compression, as well as color and duplex ultrasound, were performed to evaluate the deep venous system(s) from the level of the common femoral vein through the popliteal and proximal calf veins. COMPARISON:  None available FINDINGS: VENOUS Normal compressibility of the common femoral, superficial femoral, and popliteal veins, as well as the visualized calf veins. Visualized portions of profunda femoral vein and great saphenous vein unremarkable. No filling defects to suggest DVT on grayscale or color Doppler imaging. Doppler waveforms show normal direction of venous flow, normal respiratory plasticity and response to augmentation. OTHER None. Limitations: none IMPRESSION: No lower extremity DVT. Electronically Signed   By: Elester Grim M.D.   On: 12/25/2023 17:29   CT CHEST WO CONTRAST Result Date: 12/25/2023 CLINICAL DATA:  Bilateral lower extremity edema. History of pancreatic cancer. EXAM: CT CHEST WITHOUT CONTRAST TECHNIQUE: Multidetector CT imaging of the chest was performed following the standard protocol without IV contrast. RADIATION DOSE REDUCTION: This exam was performed according to the departmental dose-optimization program which includes automated exposure control, adjustment of the mA and/or kV according to patient size and/or use of iterative reconstruction technique. COMPARISON:  August 21, 2023. FINDINGS: Cardiovascular: Atherosclerosis of thoracic aorta without aneurysm formation. Mild cardiomegaly. No pericardial effusion. Right internal jugular Port-A-Cath is noted. Mediastinum/Nodes: No enlarged mediastinal or axillary lymph nodes. Thyroid gland, trachea, and esophagus demonstrate no significant findings.  Lungs/Pleura: No pneumothorax or pleural effusion is noted. Right lung is clear. 3.5 x 2.6 cm cavitating lesion is noted in left lower lobe which may represent cavitary pneumonia or possibly  metastatic disease. Upper Abdomen: Interval development of multiple rounded low densities throughout the left and right hepatic lobes consistent with metastatic disease. Pancreatic mass is again noted consistent with history of pancreatic malignancy. Musculoskeletal: No chest wall mass or suspicious bone lesions identified. IMPRESSION: Interval development of multiple rounded hepatic low densities consistent with metastatic disease. Pancreatic mass is again noted consistent with history of pancreatic malignancy. 3.5 x 2.6 cm cavitating lesion is noted in left lower lobe which may represent cavitary pneumonia or possibly metastatic disease. Electronically Signed   By: Rosalene Colon M.D.   On: 12/25/2023 13:53   CT ABDOMEN PELVIS W CONTRAST Result Date: 12/25/2023 CLINICAL DATA:  Sepsis. * Tracking Code: BO * EXAM: CT ABDOMEN AND PELVIS WITH CONTRAST TECHNIQUE: Multidetector CT imaging of the abdomen and pelvis was performed using the standard protocol following bolus administration of intravenous contrast. RADIATION DOSE REDUCTION: This exam was performed according to the departmental dose-optimization program which includes automated exposure control, adjustment of the mA and/or kV according to patient size and/or use of iterative reconstruction technique. CONTRAST:  OMNIPAQUE  IOHEXOL  300 MG/ML  SOLN COMPARISON:  CT scan abdomen and pelvis from 11/05/2023. FINDINGS: Lower chest: Since the prior study, there is a new thick-walled cavitary lesion in the left lung lower lobe measuring 2.4 x 2.8 cm. Visualized bilateral lung bases are otherwise clear. No pleural effusion. Normal heart size. No pericardial effusion. Hepatobiliary: The liver is normal in size. Non-cirrhotic configuration. Redemonstration of multiple (more than 25), hypoattenuating liver lesions with dominant lesion in the right hepatic lobe measuring up to 2.2 x 2.3 cm, which previously measured up to 1.5 x 1.8 cm. Findings are compatible with  worsening metastases. No intrahepatic or extrahepatic bile duct dilation. Gallbladder is surgically absent. Pancreas: Redemonstration of ill-defined heterogeneous predominantly hypoattenuating mass centered within the pancreatic tail and distal body measuring approximately 4.2 x 7.4 cm, grossly unchanged since the prior study and highly concerning for pancreatic neoplasm. Rest of the pancreas is unremarkable. Main pancreatic duct is not dilated. No peripancreatic fat stranding. 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 Spleen: Normal size spleen. Redemonstration of a hypoattenuating 2.2 x 2.6 cm lesion along the superolateral aspect, which has increased in size since the prior study. There is a probable new sub 5 mm subcapsular focus in the posterosuperior aspect, which is concerning for new metastases. Adrenals/Urinary Tract: Adrenal glands are unremarkable. No suspicious renal mass. No hydronephrosis. No renal or ureteric calculi. Unremarkable urinary bladder. Stomach/Bowel: No disproportionate dilation of the small or large bowel loops. No evidence of abnormal bowel wall thickening or inflammatory changes. The appendix is unremarkable. There are multiple diverticula mainly in the sigmoid colon, without imaging signs of diverticulitis. Vascular/Lymphatic: No ascites or pneumoperitoneum. No abdominal or pelvic lymphadenopathy, by size criteria. No aneurysmal dilation of the major abdominal arteries. There are mild peripheral atherosclerotic vascular calcifications of the aorta and its major branches. Reproductive: The uterus is surgically absent. No large adnexal mass. Other: Note is again made of thinned/lax anterior abdominal wall. There is a small fat containing right paramedian periumbilical hernia. The soft tissues and abdominal wall are otherwise unremarkable. Musculoskeletal: No suspicious osseous lesions. There are moderate multilevel  degenerative changes in the visualized spine. Bilateral marked hip  joint arthritis noted IMPRESSION: 1. Redemonstration of ill-defined heterogeneous hypoattenuating mass centered within the pancreatic tail and distal body, highly concerning for pancreatic neoplasm. 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 2. Redemonstration of multiple hypoattenuating liver lesions, which have increased in size since the prior study, compatible with worsening metastases. 3. There is a new thick-walled cavitary lesion in the left lung lower lobe measuring 2.4 x 2.8 cm. This is indeterminate and differential diagnosis includes metastases versus infectious/inflammatory etiology. Consider dedicated chest imaging. 4. Multiple other nonacute observations, as described above. Aortic Atherosclerosis (ICD10-I70.0). Electronically Signed   By: Beula Brunswick M.D.   On: 12/25/2023 11:37   DG Chest Port 1 View Result Date: 12/25/2023 CLINICAL DATA:  Suspected sepsis EXAM: PORTABLE CHEST 1 VIEW COMPARISON:  09/28/2023 FINDINGS: Numerous leads and wires project over the chest. Patient is rotated to the left. Mildly degraded exam due to AP portable technique and patient body habitus. Mild cardiomegaly. No right-sided pleural effusion. Left costophrenic angle is obscured by overlying breast tissue. IMPRESSION: Degraded exam secondary to AP portable technique and patient body habitus. Especially the inferior left hemithorax is poorly evaluated secondary to overlying soft tissues. Given this limitation, cardiomegaly and low lung volumes without acute disease. If there is a clinical concern of left lower lobe pneumonia, recommend PA and lateral radiographs. Electronically Signed   By: Lore Rode M.D.   On: 12/25/2023 10:51   IR IMAGING GUIDED PORT INSERTION Result Date: 12/17/2023 INDICATION: Port-A-Cath needed for treatment of metastatic pancreatic cancer. EXAM: FLUOROSCOPIC AND  ULTRASOUND GUIDED PLACEMENT OF A SUBCUTANEOUS PORT MEDICATIONS: Fentanyl  50 mcg ANESTHESIA/SEDATION: Patient's level of consciousness and vital signs were monitored continuously by radiology nurse throughout the procedure under the supervision of the provider performing the procedure. FLUOROSCOPY TIME:  Radiation Exposure Index (as provided by the fluoroscopic device): 17 mGy Kerma COMPLICATIONS: None immediate. PROCEDURE: The procedure, risks, benefits, and alternatives were explained to the patient. Questions regarding the procedure were encouraged and answered. The patient understands and consents to the procedure. Patient was placed supine on the interventional table. Ultrasound confirmed a patent right internal jugular vein. Ultrasound image was saved for documentation. The right chest and neck were cleaned with a skin antiseptic and a sterile drape was placed. Maximal barrier sterile technique was utilized including caps, mask, sterile gowns, sterile gloves, sterile drape, hand hygiene and skin antiseptic. The right neck was anesthetized with 1% lidocaine . Small incision was made in the right neck with a blade. Micropuncture set was placed in the right internal jugular vein with ultrasound guidance. The micropuncture wire was used for measurement purposes. The right chest was anesthetized with 1% lidocaine  with epinephrine . #15 blade was used to make an incision and a subcutaneous port pocket was formed. 8 french Power Port was assembled. Subcutaneous tunnel was formed with a stiff tunneling device. The port catheter was brought through the subcutaneous tunnel. The port was placed in the subcutaneous pocket. The micropuncture set was exchanged for a peel-away sheath. The catheter was placed through the peel-away sheath and the tip was positioned at the superior cavoatrial junction. Catheter placement was confirmed with fluoroscopy. The port was accessed and flushed with heparinized saline. The port pocket was  closed using two layers of absorbable sutures and Dermabond. The vein skin site was closed using a single layer of absorbable suture and Dermabond. Sterile dressings were applied. Patient tolerated the procedure well without an immediate complication. Ultrasound and fluoroscopic  images were taken and saved for this procedure. IMPRESSION: Placement of a subcutaneous power-injectable port device. Catheter tip at the superior cavoatrial junction. Electronically Signed   By: Elene Griffes M.D.   On: 12/17/2023 13:13   (Echo, Carotid, EGD, Colonoscopy, ERCP)    Subjective: Pt c/o fatigue   Discharge Exam: Vitals:   12/30/23 1206 12/30/23 1212  BP: (!) 97/54 126/78  Pulse: 67 71  Resp:    Temp: 97.8 F (36.6 C) 98.1 F (36.7 C)  SpO2: 100% 99%   Vitals:   12/30/23 0340 12/30/23 0833 12/30/23 1206 12/30/23 1212  BP: 96/68 (!) 104/58 (!) 97/54 126/78  Pulse: 68 71 67 71  Resp:      Temp: 97.8 F (36.6 C) 99.1 F (37.3 C) 97.8 F (36.6 C) 98.1 F (36.7 C)  TempSrc: Oral Oral Oral   SpO2: 100% 100% 100% 99%  Weight:      Height:        General: Pt is alert, awake, not in acute distress Cardiovascular: S1/S2 +, no rubs, no gallops Respiratory: decreased breath sounds b/l  Abdominal: Soft, NT, obese, bowel sounds + Extremities:  no cyanosis    The results of significant diagnostics from this hospitalization (including imaging, microbiology, ancillary and laboratory) are listed below for reference.     Microbiology: Recent Results (from the past 240 hours)  Culture, blood (Routine x 2)     Status: None   Collection Time: 12/25/23  9:45 AM   Specimen: BLOOD  Result Value Ref Range Status   Specimen Description BLOOD RIGHT ANTECUBITAL  Final   Special Requests   Final    BOTTLES DRAWN AEROBIC AND ANAEROBIC Blood Culture adequate volume   Culture   Final    NO GROWTH 5 DAYS Performed at [facility], [address] [place], [place] [address]    Report Status  12/30/2023 FINAL  Final  Culture, blood (Routine x 2)     Status: None   Collection Time: 12/25/23  9:45 AM   Specimen: BLOOD  Result Value Ref Range Status   Specimen Description BLOOD BLOOD LEFT HAND  Final   Special Requests   Final    BOTTLES DRAWN AEROBIC AND ANAEROBIC Blood Culture results may not be optimal due to an inadequate volume of blood received in culture bottles   Culture   Final    NO GROWTH 5 DAYS Performed at [facility], [address] [place], [place] [address]    Report Status 12/30/2023 FINAL  Final  Resp panel by RT-PCR (RSV, Flu A&B, Covid) Anterior Nasal Swab     Status: None   Collection Time: 12/25/23  9:46 AM   Specimen: Anterior Nasal Swab  Result Value Ref Range Status   SARS Coronavirus 2 by RT PCR NEGATIVE NEGATIVE Final    Comment: (NOTE) SARS-CoV-2 target nucleic acids are NOT DETECTED.  The SARS-CoV-2 RNA is generally detectable in upper respiratory specimens during the acute phase of infection. The lowest concentration of SARS-CoV-2 viral copies this assay can detect is 138 copies/mL. A negative result does not preclude SARS-Cov-2 infection and should not be used as the sole basis for treatment or other patient management decisions. A negative result may occur with  improper specimen collection/handling, submission of specimen other than nasopharyngeal swab, presence of viral mutation(s) within the areas targeted by this assay, and inadequate number of viral copies(<138 copies/mL). A negative result must be combined with clinical observations, patient history, and epidemiological information. The  expected result is Negative.  Fact Sheet for Patients:  BloggerCourse.com  Fact Sheet for Healthcare Providers:  SeriousBroker.it  This test is no t yet approved or cleared by the [place]  FDA and  has been authorized for detection and/or diagnosis of SARS-CoV-2 by FDA under an  Emergency Use Authorization (EUA). This EUA will remain  in effect (meaning this test can be used) for the duration of the COVID-19 declaration under Section 564(b)(1) of the Act, 21 U.S.C.section 360bbb-3(b)(1), unless the authorization is terminated  or revoked sooner.       Influenza A by PCR NEGATIVE NEGATIVE Final   Influenza B by PCR NEGATIVE NEGATIVE Final    Comment: (NOTE) The Xpert Xpress SARS-CoV-2/FLU/RSV plus assay is intended as an aid in the diagnosis of influenza from Nasopharyngeal swab specimens and should not be used as a sole basis for treatment. Nasal washings and aspirates are unacceptable for Xpert Xpress SARS-CoV-2/FLU/RSV testing.  Fact Sheet for Patients: BloggerCourse.com  Fact Sheet for Healthcare Providers: SeriousBroker.it  This test is not yet approved or cleared by the [place]  FDA and has been authorized for detection and/or diagnosis of SARS-CoV-2 by FDA under an Emergency Use Authorization (EUA). This EUA will remain in effect (meaning this test can be used) for the duration of the COVID-19 declaration under Section 564(b)(1) of the Act, 21 U.S.C. section 360bbb-3(b)(1), unless the authorization is terminated or revoked.     Resp Syncytial Virus by PCR NEGATIVE NEGATIVE Final    Comment: (NOTE) Fact Sheet for Patients: BloggerCourse.com  Fact Sheet for Healthcare Providers: SeriousBroker.it  This test is not yet approved or cleared by the [place]  FDA and has been authorized for detection and/or diagnosis of SARS-CoV-2 by FDA under an Emergency Use Authorization (EUA). This EUA will remain in effect (meaning this test can be used) for the duration of the COVID-19 declaration under Section 564(b)(1) of the Act, 21 U.S.C. section 360bbb-3(b)(1), unless the authorization is terminated or revoked.  Performed at [facility], [address] [place], [place] [address]      Labs: BNP (last 3 results) Recent Labs    04/18/23 0045 08/21/23 0924 12/25/23 0946  BNP 23.1 37.7 109.6*   Basic Metabolic Panel: Recent Labs  Lab 12/26/23 0300 12/27/23 0331 12/28/23 0500 12/29/23 0520 12/30/23 0425  NA 132* 135 137 138 136  K 3.9 3.8 3.8 3.9 3.6  CL 99 103 104 105 107  CO2 25 24 25 24 22   GLUCOSE 207* 134* 132* 164* 102*  BUN 21 16 13 13 12   CREATININE 0.97 0.92 1.03* 0.95 1.04*  CALCIUM  7.7* 7.7* 7.8* 7.9* 7.9*  MG 2.1  --   --   --   --   PHOS 2.5  --   --   --   --    Liver Function Tests: Recent Labs  Lab 12/25/23 0816 12/25/23 0946 12/26/23 0300 12/27/23 0331  AST 25 24 23 21   ALT 14 15 13 13   ALKPHOS 120 98 85 83  BILITOT 1.6* 1.5* 1.0 1.0  PROT 6.9 7.0 6.0* 5.9*  ALBUMIN  2.8* 2.7* 2.4* 2.3*   Recent Labs  Lab 12/25/23 0946  LIPASE 35   No results for input(s): "AMMONIA" in the last 168 hours. CBC: Recent Labs  Lab 12/25/23 0816 12/25/23 0816 12/25/23 0946 12/26/23 0300 12/27/23 0331 12/28/23 0500 12/29/23 0520 12/30/23 0425  WBC 24.4*   < > 24.6* 25.5* 25.3* 25.2* 22.8* 19.4*  NEUTROABS 12.4*  --  15.7* 13.3*  --   --   --   --   HGB 10.9*   < > 10.7* 10.0* 9.7* 9.3* 8.6* 8.4*  HCT 32.0*  --  31.3* 29.6* 28.7* 28.1* 26.2* 25.0*  MCV 81.2  --  81.5 81.8 82.9 83.4 85.3 85.0  PLT 189   < > 164 167 153 186 203 189   < > = values in this interval not displayed.   Cardiac Enzymes: No results for input(s): "CKTOTAL", "CKMB", "CKMBINDEX", "TROPONINI" in the last 168 hours. BNP: Invalid input(s): "POCBNP" CBG: Recent Labs  Lab 12/29/23 1131 12/29/23 1619 12/29/23 2236 12/30/23 0835 12/30/23 1209  GLUCAP 98 143* 100* 109* 163*   D-Dimer No results for input(s): "DDIMER" in the last 72 hours. Hgb A1c No results for input(s): "HGBA1C" in the last 72 hours. Lipid Profile No results for input(s): "CHOL", "HDL", "LDLCALC", "TRIG", "CHOLHDL", "LDLDIRECT" in the  last 72 hours. Thyroid function studies No results for input(s): "TSH", "T4TOTAL", "T3FREE", "THYROIDAB" in the last 72 hours.  Invalid input(s): "FREET3" Anemia work up No results for input(s): "VITAMINB12", "FOLATE", "FERRITIN", "TIBC", "IRON", "RETICCTPCT" in the last 72 hours. Urinalysis    Component Value Date/Time   COLORURINE YELLOW (A) 11/06/2023 1036   APPEARANCEUR CLEAR (A) 11/06/2023 1036   APPEARANCEUR Cloudy 08/11/2013 0124   LABSPEC >1.046 (H) 11/06/2023 1036   LABSPEC 1.021 08/11/2013 0124   PHURINE 5.0 11/06/2023 1036   GLUCOSEU 50 (A) 11/06/2023 1036   GLUCOSEU Negative 08/11/2013 0124   HGBUR NEGATIVE 11/06/2023 1036   BILIRUBINUR NEGATIVE 11/06/2023 1036   BILIRUBINUR Negative 08/11/2013 0124   KETONESUR NEGATIVE 11/06/2023 1036   PROTEINUR NEGATIVE 11/06/2023 1036   NITRITE NEGATIVE 11/06/2023 1036   LEUKOCYTESUR NEGATIVE 11/06/2023 1036   LEUKOCYTESUR Negative 08/11/2013 0124   Sepsis Labs Recent Labs  Lab 12/27/23 0331 12/28/23 0500 12/29/23 0520 12/30/23 0425  WBC 25.3* 25.2* 22.8* 19.4*   Microbiology Recent Results (from the past 240 hours)  Culture, blood (Routine x 2)     Status: None   Collection Time: 12/25/23  9:45 AM   Specimen: BLOOD  Result Value Ref Range Status   Specimen Description BLOOD RIGHT ANTECUBITAL  Final   Special Requests   Final    BOTTLES DRAWN AEROBIC AND ANAEROBIC Blood Culture adequate volume   Culture   Final    NO GROWTH 5 DAYS Performed at [facility], [address] [place], [place] [address]    Report Status 12/30/2023 FINAL  Final  Culture, blood (Routine x 2)     Status: None   Collection Time: 12/25/23  9:45 AM   Specimen: BLOOD  Result Value Ref Range Status   Specimen Description BLOOD BLOOD LEFT HAND  Final   Special Requests   Final    BOTTLES DRAWN AEROBIC AND ANAEROBIC Blood Culture results may not be optimal due to an inadequate volume of blood received in culture bottles   Culture    Final    NO GROWTH 5 DAYS Performed at [facility], [address] [place], [place] [address]    Report Status 12/30/2023 FINAL  Final  Resp panel by RT-PCR (RSV, Flu A&B, Covid) Anterior Nasal Swab     Status: None   Collection Time: 12/25/23  9:46 AM   Specimen: Anterior Nasal Swab  Result Value Ref Range Status   SARS Coronavirus 2 by RT PCR NEGATIVE NEGATIVE Final    Comment: (NOTE) SARS-CoV-2 target nucleic acids are NOT DETECTED.  The SARS-CoV-2 RNA is generally detectable in upper respiratory specimens during the acute phase of infection. The lowest concentration of SARS-CoV-2 viral copies this assay can detect is 138 copies/mL. A negative result does not preclude SARS-Cov-2 infection and should not be used as the sole basis for treatment or other patient management decisions. A negative result may occur with  improper specimen collection/handling, submission of specimen other than nasopharyngeal swab, presence of viral mutation(s) within the areas targeted by this assay, and inadequate number of viral copies(<138 copies/mL). A negative result must be combined with clinical observations, patient history, and epidemiological information. The expected result is Negative.  Fact Sheet for Patients:  BloggerCourse.com  Fact Sheet for Healthcare Providers:  SeriousBroker.it  This test is no t yet approved or cleared by the [place]  FDA and  has been authorized for detection and/or diagnosis of SARS-CoV-2 by FDA under an Emergency Use Authorization (EUA). This EUA will remain  in effect (meaning this test can be used) for the duration of the COVID-19 declaration under Section 564(b)(1) of the Act, 21 U.S.C.section 360bbb-3(b)(1), unless the authorization is terminated  or revoked sooner.       Influenza A by PCR NEGATIVE NEGATIVE Final   Influenza B by PCR NEGATIVE NEGATIVE Final    Comment: (NOTE) The  Xpert Xpress SARS-CoV-2/FLU/RSV plus assay is intended as an aid in the diagnosis of influenza from Nasopharyngeal swab specimens and should not be used as a sole basis for treatment. Nasal washings and aspirates are unacceptable for Xpert Xpress SARS-CoV-2/FLU/RSV testing.  Fact Sheet for Patients: BloggerCourse.com  Fact Sheet for Healthcare Providers: SeriousBroker.it  This test is not yet approved or cleared by the [place]  FDA and has been authorized for detection and/or diagnosis of SARS-CoV-2 by FDA under an Emergency Use Authorization (EUA). This EUA will remain in effect (meaning this test can be used) for the duration of the COVID-19 declaration under Section 564(b)(1) of the Act, 21 U.S.C. section 360bbb-3(b)(1), unless the authorization is terminated or revoked.     Resp Syncytial Virus by PCR NEGATIVE NEGATIVE Final    Comment: (NOTE) Fact Sheet for Patients: BloggerCourse.com  Fact Sheet for Healthcare Providers: SeriousBroker.it  This test is not yet approved or cleared by the [place]  FDA and has been authorized for detection and/or diagnosis of SARS-CoV-2 by FDA under an Emergency Use Authorization (EUA). This EUA will remain in effect (meaning this test can be used) for the duration of the COVID-19 declaration under Section 564(b)(1) of the Act, 21 U.S.C. section 360bbb-3(b)(1), unless the authorization is terminated or revoked.  Performed at [facility], [address] [place], [place] [address]      Time coordinating discharge: Over 30 minutes  SIGNED:   Alphonsus Jeans, MD  [facility] 12/30/2023, 1:07 PM Pager     If 7PM-7AM, please contact night-coverage [email or web address]

## 2023-12-30 NOTE — TOC Progression Note (Signed)
 Transition of Care [facility]) - Progression Note    Patient Details  Name: Kristina Cruz MRN: [id number] Date of Birth: 04/04/1961  [facility] CM/SW Contact  Brittnei Jagiello A Ramiyah Mcclenahan, RN Phone Number: 12/30/2023, 10:33 AM  Clinical Narrative:    Chart reviewed.  Noted that PT recommended Home Health PT on discharge and a wheelchair.    I have spoken with Kristina Cruz.  She informs me that prior to admission she lived at home by herself.  She informs me that she has a private duty agency that comes to assist her 3-4 hours a day with ADL's.  I have informed her that PT has recommend home health PT an a wheelchair on discharge.  Kristina Cruz reports that she has used [facility] in the past and prefers not to use them again.  She did not have a preference with the above exception.   I have asked Bartholomew Light with [facility] to accept home health referral for home health PT.    I have asked Sam Creighton with [employer] to provide patient with a wheelchair for home use.    TOC will continue to follow for discharge planning.         Expected Discharge Plan and Services                                               Social Determinants of Health (SDOH) Interventions SDOH Screenings   Food Insecurity: No Food Insecurity (12/25/2023)  Housing: Low Risk  (12/25/2023)  Transportation Needs: No Transportation Needs (12/25/2023)  Recent Concern: Transportation Needs - Unmet Transportation Needs (09/28/2023)  Utilities: Not At Risk (12/25/2023)  Financial Resource Strain: Low Risk  (09/26/2022)   [facility] from [facility], [facility]  Social Connections: Socially Isolated (12/25/2023)  Tobacco Use: Low Risk  (12/25/2023)    Readmission Risk Interventions    08/23/2023    4:39 PM  Readmission Risk Prevention Plan  Transportation Screening Complete  PCP or Specialist Appt within 5-7 Days Complete  Home Care Screening Complete  Medication Review (RN CM) Complete

## 2023-12-30 NOTE — Progress Notes (Signed)
 Physical Therapy Treatment Patient Details Name: Kristina Cruz MRN: [id number] DOB: [birth year] Today's Date: 12/30/2023   History of Present Illness Pt is a 63 y.o. female presenting to hospital 12/25/23 with c/o generalized weakness associated with N/V/D and lightheadedness; also SOB and swelling B LE's; sent over from [facility] today d/t elevated WBC count.  Pt admitted with severe sepsis, acute heart failure, cavitary lesion of lung, gastroenteritis, AKI, pancreatic CA metastasized to liver; pt also with atrial flutter with RVR.  PMH includes anemia, asthma, DM, gastric reflux, htn, morbid obesity, LE edema, RLS, stage IV pancreatic CA on chemo with liver metastasis, COPD, asthma, back pain.    PT Comments  Pt was sitting EOB upon arrival. She is A and O and agreeable to session. She endorses wanting to go home today and has available ride at 2pm. Pt was able to stand, and ambulate with RW several lps around room. ~ 50 ft. No LOB but distance limited by fatigue. Poor gait posture however this is not new. Author discussed getting pt a w/c for longer community distances and to use for upcoming chemo appointments. HHPT recommended to maximize independence and safety with all ADLs     If plan is discharge home, recommend the following: A little help with walking and/or transfers;A little help with bathing/dressing/bathroom;Assistance with cooking/housework;Assist for transportation;Help with stairs or ramp for entrance     Equipment Recommendations  Wheelchair (measurements PT);Wheelchair cushion (measurements PT);Hospital bed       Precautions / Restrictions Precautions Precautions: Fall Recall of Precautions/Restrictions: Intact     Mobility  Bed Mobility  General bed mobility comments: Pt was I'ly sitting EOB pre/post session    Transfers Overall transfer level: Needs assistance Equipment used: Rolling walker (2 wheels) Transfers: Sit to/from Stand Sit to Stand: Supervision    Ambulation/Gait Ambulation/Gait assistance: Supervision Gait Distance (Feet): 50 Feet Assistive device: Rolling walker (2 wheels) Gait Pattern/deviations: Step-through pattern, Trunk flexed Gait velocity: decreased  General Gait Details: Pt was able to ambulate ~ 3 l;ap around room. Baseline poor posture. pt leans on forearms when ambulating and have been doing so for awhile.    Balance Overall balance assessment: Needs assistance Sitting-balance support: No upper extremity supported, Feet supported Sitting balance-Leahy Scale: Good     Standing balance support: Bilateral upper extremity supported, During functional activity, Reliant on assistive device for balance Standing balance-Leahy Scale: Good      Communication Communication Communication: No apparent difficulties  Cognition Arousal: Alert Behavior During Therapy: WFL for tasks assessed/performed   PT - Cognitive impairments: No apparent impairments      PT - Cognition Comments: Pt is A and O x 4. she was sitting EOB I'ly upon arrival. Following commands: Intact      Cueing Cueing Techniques: Verbal cues     General Comments General comments (skin integrity, edema, etc.): Discussed at length post acute needs and expections . pt would benefit form WC at DC for community distances      Pertinent Vitals/Pain Pain Assessment Pain Assessment: 0-10 Pain Score: 2  Pain Location: L hip pain Pain Descriptors / Indicators: Aching, Discomfort Pain Intervention(s): Limited activity within patient's tolerance, Monitored during session, Premedicated before session, Repositioned     PT Goals (current goals can now be found in the care plan section) Acute Rehab PT Goals Patient Stated Goal: go home Progress towards PT goals: Progressing toward goals    Frequency    Min 3X/week       AM-PAC PT "6  Clicks" Mobility   Outcome Measure  Help needed turning from your back to your side while in a flat bed without using  bedrails?: None Help needed moving from lying on your back to sitting on the side of a flat bed without using bedrails?: A Little Help needed moving to and from a bed to a chair (including a wheelchair)?: A Little Help needed standing up from a chair using your arms (e.g., wheelchair or bedside chair)?: A Little Help needed to walk in hospital room?: A Little Help needed climbing 3-5 steps with a railing? : A Lot 6 Click Score: 18    End of Session Equipment Utilized During Treatment: Gait belt Activity Tolerance: Patient limited by fatigue Patient left: Other (comment) (Pt was seated EOB upon arrival) Nurse Communication: Mobility status;Precautions PT Visit Diagnosis: Other abnormalities of gait and mobility (R26.89);Muscle weakness (generalized) (M62.81);Pain Pain - Right/Left: Left Pain - part of body: Hip     Time: 0865-7846 PT Time Calculation (min) (ACUTE ONLY): 16 min  Charges:    $Gait Training: 8-22 mins PT General Charges $$ ACUTE PT VISIT: 1 Visit                     Chester Costa PTA 12/30/23, 8:39 AM

## 2024-01-01 ENCOUNTER — Inpatient Hospital Stay: Admitting: Oncology

## 2024-01-01 ENCOUNTER — Inpatient Hospital Stay

## 2024-01-05 ENCOUNTER — Telehealth: Payer: Self-pay | Admitting: *Deleted

## 2024-01-05 NOTE — Telephone Encounter (Signed)
 The patient called in and said that she has burning of the legs and also swelling both the legs.  She says she can barely even walk .  She says that she is going to be here on May 23 and she would like to have something that is going to help her with the burning and swelling for her legs. It looks like swelling has been an issue but sounds that it getting worse

## 2024-01-05 NOTE — Telephone Encounter (Signed)
 Spoke to pt and informed her that Dr. Wilhelmenia Harada recommends increasing Gabapentin  to 2 caps 3x a day and to take Lasix . Pt states she has been taking Lasix  daily but it has not helped and when she was in the hospital they ran tests but didn't find out what was going on. Advised pt to elevate legs and wear compression stockings. Pt verbalized understanding.

## 2024-01-08 ENCOUNTER — Encounter: Payer: Self-pay | Admitting: Oncology

## 2024-01-08 ENCOUNTER — Inpatient Hospital Stay (HOSPITAL_BASED_OUTPATIENT_CLINIC_OR_DEPARTMENT_OTHER): Admitting: Oncology

## 2024-01-08 ENCOUNTER — Inpatient Hospital Stay

## 2024-01-08 ENCOUNTER — Ambulatory Visit: Admitting: Oncology

## 2024-01-08 ENCOUNTER — Other Ambulatory Visit

## 2024-01-08 ENCOUNTER — Inpatient Hospital Stay (HOSPITAL_BASED_OUTPATIENT_CLINIC_OR_DEPARTMENT_OTHER): Admitting: Hospice and Palliative Medicine

## 2024-01-08 ENCOUNTER — Ambulatory Visit

## 2024-01-08 VITALS — BP 140/81 | HR 79 | Temp 97.3°F | Resp 18 | Wt 287.0 lb

## 2024-01-08 DIAGNOSIS — R600 Localized edema: Secondary | ICD-10-CM | POA: Diagnosis not present

## 2024-01-08 DIAGNOSIS — I4891 Unspecified atrial fibrillation: Secondary | ICD-10-CM

## 2024-01-08 DIAGNOSIS — Z515 Encounter for palliative care: Secondary | ICD-10-CM

## 2024-01-08 DIAGNOSIS — C259 Malignant neoplasm of pancreas, unspecified: Secondary | ICD-10-CM | POA: Diagnosis not present

## 2024-01-08 DIAGNOSIS — G893 Neoplasm related pain (acute) (chronic): Secondary | ICD-10-CM | POA: Diagnosis not present

## 2024-01-08 DIAGNOSIS — M7989 Other specified soft tissue disorders: Secondary | ICD-10-CM | POA: Diagnosis not present

## 2024-01-08 DIAGNOSIS — C787 Secondary malignant neoplasm of liver and intrahepatic bile duct: Secondary | ICD-10-CM

## 2024-01-08 DIAGNOSIS — C252 Malignant neoplasm of tail of pancreas: Secondary | ICD-10-CM | POA: Diagnosis not present

## 2024-01-08 LAB — CBC WITH DIFFERENTIAL (CANCER CENTER ONLY)
Abs Immature Granulocytes: 0.02 10*3/uL (ref 0.00–0.07)
Basophils Absolute: 0.1 10*3/uL (ref 0.0–0.1)
Basophils Relative: 1 %
Eosinophils Absolute: 1 10*3/uL — ABNORMAL HIGH (ref 0.0–0.5)
Eosinophils Relative: 14 %
HCT: 27.4 % — ABNORMAL LOW (ref 36.0–46.0)
Hemoglobin: 8.9 g/dL — ABNORMAL LOW (ref 12.0–15.0)
Immature Granulocytes: 0 %
Lymphocytes Relative: 26 %
Lymphs Abs: 1.9 10*3/uL (ref 0.7–4.0)
MCH: 29.3 pg (ref 26.0–34.0)
MCHC: 32.5 g/dL (ref 30.0–36.0)
MCV: 90.1 fL (ref 80.0–100.0)
Monocytes Absolute: 1.1 10*3/uL — ABNORMAL HIGH (ref 0.1–1.0)
Monocytes Relative: 15 %
Neutro Abs: 3.2 10*3/uL (ref 1.7–7.7)
Neutrophils Relative %: 44 %
Platelet Count: 240 10*3/uL (ref 150–400)
RBC: 3.04 MIL/uL — ABNORMAL LOW (ref 3.87–5.11)
RDW: 24.4 % — ABNORMAL HIGH (ref 11.5–15.5)
WBC Count: 7.3 10*3/uL (ref 4.0–10.5)
nRBC: 0 % (ref 0.0–0.2)

## 2024-01-08 LAB — CMP (CANCER CENTER ONLY)
ALT: 15 U/L (ref 0–44)
AST: 21 U/L (ref 15–41)
Albumin: 2.6 g/dL — ABNORMAL LOW (ref 3.5–5.0)
Alkaline Phosphatase: 123 U/L (ref 38–126)
Anion gap: 9 (ref 5–15)
BUN: 8 mg/dL (ref 8–23)
CO2: 25 mmol/L (ref 22–32)
Calcium: 8.2 mg/dL — ABNORMAL LOW (ref 8.9–10.3)
Chloride: 105 mmol/L (ref 98–111)
Creatinine: 0.82 mg/dL (ref 0.44–1.00)
GFR, Estimated: >60 mL/min (ref >60)
Glucose, Bld: 155 mg/dL — ABNORMAL HIGH (ref 70–99)
Potassium: 3.9 mmol/L (ref 3.5–5.1)
Sodium: 139 mmol/L (ref 135–145)
Total Bilirubin: 0.6 mg/dL (ref 0.0–1.2)
Total Protein: 6.5 g/dL (ref 6.5–8.1)

## 2024-01-08 MED ORDER — HEPARIN SOD (PORK) LOCK FLUSH 100 UNIT/ML IV SOLN
500.0000 [IU] | Freq: Once | INTRAVENOUS | Status: AC
Start: 2024-01-08 — End: 2024-01-08
  Administered 2024-01-08: 500 [IU] via INTRAVENOUS
  Filled 2024-01-08: qty 5

## 2024-01-08 MED ORDER — LIDOCAINE-PRILOCAINE 2.5-2.5 % EX CREA: 1.0000 | TOPICAL_CREAM | CUTANEOUS | 1 refills | Status: DC | PRN

## 2024-01-08 MED ORDER — TRAZODONE HCL 50 MG PO TABS: 25.0000 mg | ORAL_TABLET | Freq: Every evening | ORAL | 2 refills | Status: DC | PRN

## 2024-01-08 NOTE — Assessment & Plan Note (Addendum)
 Patient is on diuretics.  Symptom has not improved.  She gained 20 to 30 pounds since last visit. Recent echocardiogram showed normal LVEF. ?  Anasarca versus lymphedema.

## 2024-01-08 NOTE — Assessment & Plan Note (Signed)
 Atrial flutter: with RVR. S/p TEE & cardioverison on 12/28/23  Recommend patient to follow-up with cardiology.  Continue metoprolol  and Eliquis 

## 2024-01-08 NOTE — Progress Notes (Signed)
 Palliative Medicine [facility] Telephone:([phone]) [phone] [phone]   Name: Kristina Cruz Date: 01/08/2024 MRN: [id number]  DOB: 04/02/1961  Patient Care Team: Chucky Craver, FNP as PCP - General (Nurse Practitioner) Devorah Fonder, MD as PCP - Cardiology (Cardiology) [name], MD as Consulting Physician (Oncology) Rochell Chroman, RN as Oncology Nurse Navigator    REASON FOR CONSULTATION: Kristina Cruz is a 63 y.o. 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 Palliative care consulted to address goals and manage ongoing symptoms.    SOCIAL HISTORY:     reports that she has never smoked. She has never used smokeless tobacco. She reports that she does not drink alcohol and does not use drugs.  Patient never married.  Lives at home alone.  She has a daughter who is involved.  Patient also has caregivers for 20 hours weekly.  Patient previously worked as a Lawyer and in a Futures trader.  ADVANCE DIRECTIVES:  Does not have  CODE STATUS:   PAST MEDICAL HISTORY: Past Medical History:  Diagnosis Date   Anemia    Asthma    Back pain    Cataract    Diabetes mellitus without complication (HCC)    GERD (gastroesophageal reflux disease)    History of echocardiogram    Hypertension    Morbid obesity with BMI of 60.0-69.9, adult (HCC)    Restless leg syndrome     PAST SURGICAL HISTORY:  Past Surgical History:  Procedure Laterality Date   ABDOMINAL HYSTERECTOMY     CARDIOVERSION N/A 12/28/2023   Procedure: CARDIOVERSION;  Surgeon: Constancia Delton, MD;  Location: [facility] ORS;  Service: Cardiovascular;  Laterality: N/A;   CATARACT EXTRACTION W/PHACO Left 04/13/2020   Procedure: CATARACT EXTRACTION PHACO AND INTRAOCULAR LENS PLACEMENT (IOC);  Surgeon: Ola Berger, MD;  Location: [facility] ORS;  Service: Ophthalmology;   Laterality: Left;  US  00:36.0 CDE 5.95 Fluid Pack lot # [id number]   HYSTEROSCOPY WITH D & C N/A 07/14/2017   Procedure: DILATATION AND CURETTAGE /HYSTEROSCOPY;  Surgeon: Alben Alma, MD;  Location: [facility] ORS;  Service: Gynecology;  Laterality: N/A;   IR IMAGING GUIDED PORT INSERTION  12/17/2023   POLYPECTOMY  2015   TEE WITHOUT CARDIOVERSION N/A 12/28/2023   Procedure: ECHOCARDIOGRAM, TRANSESOPHAGEAL;  Surgeon: Constancia Delton, MD;  Location: [facility] ORS;  Service: Cardiovascular;  Laterality: N/A;    HEMATOLOGY/ONCOLOGY HISTORY:  Oncology History  Pancreatic cancer metastasized to liver (HCC)  08/21/2023 Imaging   CT abdomen pelvis with contrast showed 1. Masslike thickening involving the proximal and mid tail of pancreas with relative hypoenhancement measures approximately 3.9 x 5.5 cm. In the absence of signs/symptoms of acute pancreatitis underlying neoplastic process cannot be excluded. Further evaluation with nonemergent contrast enhanced MRI of the abdomen is advised. Note: Given patient's body habitus and the motion artifact observed on the current exam an MRI obtained at this time is likely to be severely limited and likely nondiagnostic. MRI should be obtained only once the patient is clinically stable, and is able to remain motionless and breath hold. 2. Patchy ground-glass and airspace densities identified within both lower lobes. These are new compared with the previous exam and are concerning for underlying inflammatory/infectious process. 3. Sigmoid diverticulosis without signs of acute diverticulitis. 4. Marked diastasis recti with ventral herniation of the large and small bowel loops. 5. Periumbilical hernia is containing fat only.   08/24/2023 Imaging  MRI abdomen wo contrast  1. Moderately suboptimal unenhanced exam. 2. Heterogeneous masslike thickening of the pancreatic body/tail again seen. The lesion remains indeterminate on this exam. Differential diagnosis  includes pancreatic tumor, autoimmune pancreatitis, mass forming chronic pancreatitis, etc. Please see above for follow-up recommendations. 3. Mild diffuse hepatic steatosis. There are indeterminate areas in the liver, which can also be better evaluated on the contrast-enhanced MRI abdomen. 4. Other observations, as described above.   11/05/2023 Initial Diagnosis   Pancreatic cancer metastasized to liver  Patient was hospitalized from 11/05/2023 - 11/10/2023 She presented to emergency room due to nausea, vomiting and abdominal pain. CT scan showed multiple hypodense hepatic lesions increasing number and size from prior imaging.  Pancreatic tail mass, hypodense lesion in the anterior spleen.  11/09/2023 ultrasound-guided biopsy was obtained. Pathology showed moderately to poorly differentiated adenocarcinoma with extensive areas of tumor necrosis. The adenocarcinoma is positive for cytokeratin 7 and CDX2 (focal).     Cytokeratin 20, TTF-1, and GATA3 are negative. The pattern of immunoreactivity is not specific and the differential diagnosis includes metastatic adenocarcinoma from the pancreaticobiliary tract and upper GI. Intrahepatic       cholangiocarcinoma is also a diagnostic possibility. Given the radiographic  findings a pancreaticobiliary primary is favored and correlation with clinical impression is required.    11/17/2023 Cancer Staging   Staging form: Exocrine Pancreas, AJCC 8th Edition - Clinical stage from 11/17/2023: Stage IV (cT3, cNX, pM1) - Signed by [name], MD on 11/17/2023 Stage prefix: Initial diagnosis    Imaging   CT abdomen pelvis w contrast  1. Multiple hypodense hepatic lesions, increased in number and size from prior imaging, highly suspicious for metastatic disease. 2. Hypodense lesion in the pancreatic tail measures 7.2 x 3.9 cm, increased in size from prior. The increase in size favors pancreatic neoplasm, with additional etiologies less likely. 3. New hypodense  lesion in the anterior spleen, 13 mm, suspicious for metastatic disease. 4. Laxity of the anterior abdominal wall with midline ventral abdominal wall hernia containing small and to a lesser extent large bowel. No bowel obstruction or inflammation. 5. Colonic diverticulosis without diverticulitis.   Aortic Atherosclerosis (ICD10-I70.0)    11/27/2023 -  Chemotherapy   Patient is on Treatment Plan : PANCREATIC Abraxane  D1,8,15 + Gemcitabine  D1,8,15 q28d       ALLERGIES:  is allergic to peanut-containing drug products.  MEDICATIONS:  Current Outpatient Medications  Medication Sig Dispense Refill   traZODone  (DESYREL ) 50 MG tablet Take 0.5-1 tablets (25-50 mg total) by mouth at bedtime as needed for sleep. 30 tablet 2   acetaminophen  (TYLENOL  8 HOUR ARTHRITIS PAIN) 650 MG CR tablet Take 3 tablets (1,950 mg total) by mouth every 8 (eight) hours as needed for pain. Do not exceed 4000 mg total in a day.  Home med.     apixaban  (ELIQUIS ) 5 MG TABS tablet Take 1 tablet (5 mg total) by mouth 2 (two) times daily. 60 tablet 0   azelastine (ASTELIN) 0.1 % nasal spray Place 1 spray into both nostrils 2 (two) times daily.     diphenoxylate -atropine  (LOMOTIL ) 2.5-0.025 MG tablet Take 2 tablets by mouth every 6 (six) hours as needed (for diarrhea unrelieved by imodium). 90 tablet 0   EPINEPHrine  0.3 mg/0.3 mL IJ SOAJ injection Inject 0.3 mg into the muscle as needed for anaphylaxis. 1 each 0   fluticasone  (FLONASE ) 50 MCG/ACT nasal spray Place 1 spray into both nostrils daily. 9.9 mL 0   fluticasone  furoate-vilanterol (BREO ELLIPTA ) 100-25 MCG/ACT  AEPB Inhale 1 puff into the lungs daily. 28 each 1   furosemide  (LASIX ) 40 MG tablet Take 40 mg by mouth every other day.     gabapentin  (NEURONTIN ) 100 MG capsule Take 1 capsule (100 mg total) by mouth 3 (three) times daily. 30 capsule 0   ipratropium-albuterol  (DUONEB) 0.5-2.5 (3) MG/3ML SOLN Take 3 mLs by nebulization every 4 (four) hours as needed (wheezing  / shortness of breath). 360 mL 1   levocetirizine (XYZAL) 5 MG tablet Take 5 mg by mouth daily.     lidocaine -prilocaine (EMLA) cream Apply 1 Application topically as needed. Apply to port and cover with saran wrap 1-2 hours prior to port access 30 g 1   magic mouthwash (lidocaine , diphenhydrAMINE , alum & mag hydroxide) suspension Swish and swallow 5 mLs 4 (four) times daily as needed for mouth pain. 360 mL 1   metFORMIN  (GLUCOPHAGE ) 1000 MG tablet Take 1,000 mg by mouth 2 (two) times daily.     metoprolol  tartrate (LOPRESSOR ) 25 MG tablet Take 1 tablet (25 mg total) by mouth 2 (two) times daily. 60 tablet 0   morphine  (MS CONTIN ) 15 MG 12 hr tablet Take 1 tablet (15 mg total) by mouth every 12 (twelve) hours. 30 tablet 0   naloxone  (NARCAN ) nasal spray 4 mg/0.1 mL SPRAY 1 SPRAY INTO ONE NOSTRIL AS DIRECTED FOR OPIOID OVERDOSE (TURN PERSON ON SIDE AFTER DOSE. IF NO RESPONSE IN 2-3 MINUTES OR PERSON RESPONDS BUT RELAPSES, REPEAT USING A NEW SPRAY DEVICE AND SPRAY INTO THE OTHER NOSTRIL. CALL 911 AFTER USE.) * EMERGENCY USE ONLY * 1 each 0   ondansetron  (ZOFRAN -ODT) 4 MG disintegrating tablet Take 1 tablet (4 mg total) by mouth every 8 (eight) hours as needed for nausea or vomiting. 45 tablet 0   oxyCODONE  (OXY IR/ROXICODONE ) 5 MG immediate release tablet Take 1 tablet (5 mg total) by mouth every 4 (four) hours as needed for moderate pain (pain score 4-6) or severe pain (pain score 7-10). 90 tablet 0   potassium chloride  SA (KLOR-CON  M) 20 MEQ tablet Take 1 tablet (20 mEq total) by mouth daily. 3 tablet 0   prochlorperazine  (COMPAZINE ) 10 MG tablet Take 1 tablet (10 mg total) by mouth every 6 (six) hours as needed for nausea or vomiting. 30 tablet 1   rOPINIRole  (REQUIP ) 2 MG tablet Take 2 mg by mouth in the morning and at bedtime.      Semaglutide, 1 MG/DOSE, 4 MG/3ML SOPN Inject 1 mg into the skin once a week.     VENTOLIN  HFA 108 (90 Base) MCG/ACT inhaler Inhale 2 puffs into the lungs every 4 (four)  hours as needed for wheezing or shortness of breath. 18 g 1   No current facility-administered medications for this visit.    VITAL SIGNS: LMP 01/04/2018  There were no vitals filed for this visit.  Estimated body mass index is 52.49 kg/m as calculated from the following:   Height as of 12/25/23: 5\' 2"  (1.575 m).   Weight as of an earlier encounter on 01/08/24: 287 lb (130.2 kg).  LABS: CBC:    Component Value Date/Time   WBC 7.3 01/08/2024 0839   WBC 19.4 (H) 12/30/2023 0425   HGB 8.9 (L) 01/08/2024 0839   HGB 12.0 04/10/2014 1001   HCT 27.4 (L) 01/08/2024 0839   HCT 38.2 04/10/2014 1001   PLT 240 01/08/2024 0839   PLT 421 04/10/2014 1001   MCV 90.1 01/08/2024 0839   MCV 83 04/10/2014 1001  NEUTROABS 3.2 01/08/2024 0839   NEUTROABS 6.3 03/20/2014 0707   LYMPHSABS 1.9 01/08/2024 0839   LYMPHSABS 0.6 (L) 03/20/2014 0707   MONOABS 1.1 (H) 01/08/2024 0839   MONOABS 0.2 03/20/2014 0707   EOSABS 1.0 (H) 01/08/2024 0839   EOSABS 0.1 03/20/2014 0707   BASOSABS 0.1 01/08/2024 0839   BASOSABS 0.0 03/20/2014 0707   Comprehensive Metabolic Panel:    Component Value Date/Time   NA 139 01/08/2024 0839   NA 143 04/10/2014 1001   K 3.9 01/08/2024 0839   K 3.4 (L) 04/10/2014 1001   CL 105 01/08/2024 0839   CL 108 (H) 04/10/2014 1001   CO2 25 01/08/2024 0839   CO2 31 04/10/2014 1001   BUN 8 01/08/2024 0839   BUN 14 04/10/2014 1001   CREATININE 0.82 01/08/2024 0839   CREATININE 1.14 04/10/2014 1001   GLUCOSE 155 (H) 01/08/2024 0839   GLUCOSE 107 (H) 04/10/2014 1001   CALCIUM  8.2 (L) 01/08/2024 0839   CALCIUM  8.8 04/10/2014 1001   AST 21 01/08/2024 0839   ALT 15 01/08/2024 0839   ALT 14 04/10/2014 1001   ALKPHOS 123 01/08/2024 0839   ALKPHOS 82 04/10/2014 1001   BILITOT 0.6 01/08/2024 0839   PROT 6.5 01/08/2024 0839   PROT 7.5 04/10/2014 1001   ALBUMIN  2.6 (L) 01/08/2024 0839   ALBUMIN  3.1 (L) 04/10/2014 1001    RADIOGRAPHIC STUDIES: DG HIPS BILAT WITH PELVIS 3-4  VIEWS Result Date: 12/29/2023 CLINICAL DATA:  Bilateral hip pain. EXAM: DG HIP (WITH OR WITHOUT PELVIS) 3-4V BILAT COMPARISON:  None Available. FINDINGS: There is no evidence of hip fracture or dislocation. Severe narrowing and osteophyte formation is seen involving both hips. 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 No acute abnormality seen. Electronically Signed   By: Rosalene Colon M.D.   On: 12/29/2023 17:29   ECHO TEE Result Date: 12/28/2023    TRANSESOPHOGEAL ECHO REPORT   Patient Name:   Kristina Cruz Date of Exam: 12/28/2023 Medical Rec #:  [id number]          Height:       62.0 in Accession #:    [id number]         Weight:       248.2 lb Date of Birth:  26-Jan-1961          BSA:          2.096 m Patient Age:    63 years           BP:           83/54 mmHg Patient Gender: F                  HR:           85 bpm. Exam Location:  [facility] Procedure: Transesophageal Echo, Color Doppler and Cardiac Doppler (Both            Spectral and Color Flow Doppler were utilized during procedure). Indications:     Atrial Fibrillation I48.91  History:         Patient has prior history of Echocardiogram examinations, most                  recent 12/26/2023. Risk Factors:Diabetes and Hypertension. GERD.  Sonographer:     Broadus Canes Referring Phys:  [id number] Eugene Hertz BERGE Diagnosing Phys: Constancia Delton MD PROCEDURE: The transesophogeal probe was passed without difficulty through the esophogus of the patient. Sedation performed by different physician. The  patient developed no complications during the procedure.  IMPRESSIONS  1. Left ventricular ejection fraction, by estimation, is 55 to 60%. The left ventricle has normal function.  2. Right ventricular systolic function is normal. The right ventricular size is normal.  3. No left atrial/left atrial appendage thrombus was detected.  4. The mitral valve is normal in structure. Trivial mitral valve regurgitation.  5. Tricuspid valve regurgitation is  mild to moderate.  6. The aortic valve is tricuspid. Aortic valve regurgitation is not visualized. FINDINGS  Left Ventricle: Left ventricular ejection fraction, by estimation, is 55 to 60%. The left ventricle has normal function. The left ventricular internal cavity size was normal in size. Right Ventricle: The right ventricular size is normal. No increase in right ventricular wall thickness. Right ventricular systolic function is normal. Left Atrium: Left atrial size was normal in size. No left atrial/left atrial appendage thrombus was detected. Right Atrium: Right atrial size was normal in size. Pericardium: There is no evidence of pericardial effusion. Mitral Valve: The mitral valve is normal in structure. Trivial mitral valve regurgitation. Tricuspid Valve: The tricuspid valve is normal in structure. Tricuspid valve regurgitation is mild to moderate. Aortic Valve: The aortic valve is tricuspid. Aortic valve regurgitation is not visualized. Pulmonic Valve: The pulmonic valve was grossly normal. Pulmonic valve regurgitation is trivial. Aorta: The aortic root is normal in size and structure. IAS/Shunts: No atrial level shunt detected by color flow Doppler. Constancia Delton MD Electronically signed by Constancia Delton MD Signature Date/Time: 12/28/2023/4:39:22 PM    Final    ECHOCARDIOGRAM COMPLETE Result Date: 12/26/2023    ECHOCARDIOGRAM REPORT   Patient Name:   Kristina Cruz Date of Exam: 12/26/2023 Medical Rec #:  [id number]          Height:       62.0 in Accession #:    [id number]         Weight:       248.2 lb Date of Birth:  10-21-1960          BSA:          2.096 m Patient Age:    63 years           BP:           91/66 mmHg Patient Gender: F                  HR:           111 bpm. Exam Location:  [facility] Procedure: 2D Echo (Both Spectral and Color Flow Doppler were utilized during            procedure). Indications:     CHF I50.31  History:         Patient has prior history of Echocardiogram  examinations, most                  recent 02/12/2023.  Sonographer:     Clenton Czech RDCS, FASE Referring Phys:  [id number] Avi Body Diagnosing Phys: Belva Boyden MD  Sonographer Comments: Technically challenging study due to limited acoustic windows, suboptimal apical window and patient is obese. Image acquisition challenging due to patient body habitus and Image acquisition challenging due to uncooperative patient. The patient declined Definity  IV ultrasound imaging agent. IMPRESSIONS  1. Left ventricular ejection fraction, by estimation, is 55 to 60%. Left ventricular ejection fraction by PLAX is 51 %. The left ventricle has normal function. The left ventricle has no regional wall motion abnormalities. There is moderate  left ventricular hypertrophy. Left ventricular diastolic parameters are indeterminate.  2. Right ventricular systolic function is mildly reduced. The right ventricular size is normal. Tricuspid regurgitation signal is inadequate for assessing PA pressure.  3. The mitral valve is normal in structure. No evidence of mitral valve regurgitation. No evidence of mitral stenosis.  4. The aortic valve is normal in structure. Aortic valve regurgitation is not visualized. No aortic stenosis is present.  5. The inferior vena cava is normal in size with greater than 50% respiratory variability, suggesting right atrial pressure of 3 mmHg. FINDINGS  Left Ventricle: Left ventricular ejection fraction, by estimation, is 55 to 60%. Left ventricular ejection fraction by PLAX is 51 %. The left ventricle has normal function. The left ventricle has no regional wall motion abnormalities. Strain was performed and the global longitudinal strain is indeterminate. The left ventricular internal cavity size was normal in size. There is moderate left ventricular hypertrophy. Left ventricular diastolic parameters are indeterminate. Right Ventricle: The right ventricular size is normal. No increase in right ventricular  wall thickness. Right ventricular systolic function is mildly reduced. Tricuspid regurgitation signal is inadequate for assessing PA pressure. Left Atrium: Left atrial size was normal in size. Right Atrium: Right atrial size was normal in size. Pericardium: There is no evidence of pericardial effusion. Mitral Valve: The mitral valve is normal in structure. No evidence of mitral valve regurgitation. No evidence of mitral valve stenosis. Tricuspid Valve: The tricuspid valve is normal in structure. Tricuspid valve regurgitation is not demonstrated. No evidence of tricuspid stenosis. Aortic Valve: The aortic valve is normal in structure. Aortic valve regurgitation is not visualized. No aortic stenosis is present. Pulmonic Valve: The pulmonic valve was normal in structure. Pulmonic valve regurgitation is not visualized. No evidence of pulmonic stenosis. Aorta: The aortic root is normal in size and structure. Venous: The inferior vena cava is normal in size with greater than 50% respiratory variability, suggesting right atrial pressure of 3 mmHg. IAS/Shunts: No atrial level shunt detected by color flow Doppler. Additional Comments: 3D was performed not requiring image post processing on an independent workstation and was indeterminate.  LEFT VENTRICLE PLAX 2D LV EF:         Left ventricular ejection fraction by PLAX is 51 %. LVIDd:         4.60 cm LVIDs:         3.40 cm LV PW:         1.40 cm LV IVS:        1.40 cm LVOT diam:     1.90 cm LVOT Area:     2.84 cm  LEFT ATRIUM         Index LA diam:    3.30 cm 1.57 cm/m                        PULMONIC VALVE AORTA                 PV Vmax:       0.98 m/s Ao Root diam: 3.90 cm PV Peak grad:  3.8 mmHg Ao Asc diam:  3.00 cm  MITRAL VALVE               TRICUSPID VALVE MV Area (PHT): 4.46 cm    TR Peak grad:   13.0 mmHg MV Decel Time: 170 msec    TR Vmax:        180.00 cm/s MV E velocity: 98.50 cm/s MV A velocity:  68.70 cm/s  SHUNTS MV E/A ratio:  1.43        Systemic Diam: 1.90  cm Belva Boyden MD Electronically signed by Belva Boyden MD Signature Date/Time: 12/26/2023/4:06:59 PM    Final    US  Venous Img Lower Bilateral (DVT) Result Date: 12/25/2023 CLINICAL DATA:  Lower extremity edema for 2 weeks EXAM: Bilateral Lower Extremity Venous Doppler Ultrasound TECHNIQUE: Gray-scale sonography with compression, as well as color and duplex ultrasound, were performed to evaluate the deep venous system(s) from the level of the common femoral vein through the popliteal and proximal calf veins. COMPARISON:  None available FINDINGS: VENOUS Normal compressibility of the common femoral, superficial femoral, and popliteal veins, as well as the visualized calf veins. Visualized portions of profunda femoral vein and great saphenous vein unremarkable. No filling defects to suggest DVT on grayscale or color Doppler imaging. Doppler waveforms show normal direction of venous flow, normal respiratory plasticity and response to augmentation. OTHER None. Limitations: none IMPRESSION: No lower extremity DVT. Electronically Signed   By: Elester Grim M.D.   On: 12/25/2023 17:29   CT CHEST WO CONTRAST Result Date: 12/25/2023 CLINICAL DATA:  Bilateral lower extremity edema. History of pancreatic cancer. EXAM: CT CHEST WITHOUT CONTRAST TECHNIQUE: Multidetector CT imaging of the chest was performed following the standard protocol without IV contrast. RADIATION DOSE REDUCTION: This exam was performed according to the departmental dose-optimization program which includes automated exposure control, adjustment of the mA and/or kV according to patient size and/or use of iterative reconstruction technique. COMPARISON:  August 21, 2023. FINDINGS: Cardiovascular: Atherosclerosis of thoracic aorta without aneurysm formation. Mild cardiomegaly. No pericardial effusion. Right internal jugular Port-A-Cath is noted. Mediastinum/Nodes: No enlarged mediastinal or axillary lymph nodes. Thyroid  gland, trachea, and esophagus  demonstrate no significant findings. Lungs/Pleura: No pneumothorax or pleural effusion is noted. Right lung is clear. 3.5 x 2.6 cm cavitating lesion is noted in left lower lobe which may represent cavitary pneumonia or possibly metastatic disease. Upper Abdomen: Interval development of multiple rounded low densities throughout the left and right hepatic lobes consistent with metastatic disease. Pancreatic mass is again noted consistent with history of pancreatic malignancy. Musculoskeletal: No chest wall mass or suspicious bone lesions identified. IMPRESSION: Interval development of multiple rounded hepatic low densities consistent with metastatic disease. Pancreatic mass is again noted consistent with history of pancreatic malignancy. 3.5 x 2.6 cm cavitating lesion is noted in left lower lobe which may represent cavitary pneumonia or possibly metastatic disease. Electronically Signed   By: Rosalene Colon M.D.   On: 12/25/2023 13:53   CT ABDOMEN PELVIS W CONTRAST Result Date: 12/25/2023 CLINICAL DATA:  Sepsis. * Tracking Code: BO * EXAM: CT ABDOMEN AND PELVIS WITH CONTRAST TECHNIQUE: Multidetector CT imaging of the abdomen and pelvis was performed using the standard protocol following bolus administration of intravenous contrast. RADIATION DOSE REDUCTION: This exam was performed according to the departmental dose-optimization program which includes automated exposure control, adjustment of the mA and/or kV according to patient size and/or use of iterative reconstruction technique. CONTRAST:  OMNIPAQUE  IOHEXOL  300 MG/ML  SOLN COMPARISON:  CT scan abdomen and pelvis from 11/05/2023. FINDINGS: Lower chest: Since the prior study, there is a new thick-walled cavitary lesion in the left lung lower lobe measuring 2.4 x 2.8 cm. Visualized bilateral lung bases are otherwise clear. No pleural effusion. Normal heart size. No pericardial effusion. Hepatobiliary: The liver is normal in size. Non-cirrhotic configuration.  Redemonstration of multiple (more than 25), hypoattenuating liver lesions with dominant lesion  in the right hepatic lobe measuring up to 2.2 x 2.3 cm, which previously measured up to 1.5 x 1.8 cm. Findings are compatible with worsening metastases. No intrahepatic or extrahepatic bile duct dilation. Gallbladder is surgically absent. Pancreas: Redemonstration of ill-defined heterogeneous predominantly hypoattenuating mass centered within the pancreatic tail and distal body measuring approximately 4.2 x 7.4 cm, grossly unchanged since the prior study and highly concerning for pancreatic neoplasm. Rest of the pancreas is unremarkable. Main pancreatic duct is not dilated. No peripancreatic fat stranding. 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 Spleen: Normal size spleen. Redemonstration of a hypoattenuating 2.2 x 2.6 cm lesion along the superolateral aspect, which has increased in size since the prior study. There is a probable new sub 5 mm subcapsular focus in the posterosuperior aspect, which is concerning for new metastases. Adrenals/Urinary Tract: Adrenal glands are unremarkable. No suspicious renal mass. No hydronephrosis. No renal or ureteric calculi. Unremarkable urinary bladder. Stomach/Bowel: No disproportionate dilation of the small or large bowel loops. No evidence of abnormal bowel wall thickening or inflammatory changes. The appendix is unremarkable. There are multiple diverticula mainly in the sigmoid colon, without imaging signs of diverticulitis. Vascular/Lymphatic: No ascites or pneumoperitoneum. No abdominal or pelvic lymphadenopathy, by size criteria. No aneurysmal dilation of the major abdominal arteries. There are mild peripheral atherosclerotic vascular calcifications of the aorta and its major branches. Reproductive: The uterus is surgically absent. No large adnexal mass. Other: Note is again made of thinned/lax anterior abdominal  wall. There is a small fat containing right paramedian periumbilical hernia. The soft tissues and abdominal wall are otherwise unremarkable. Musculoskeletal: No suspicious osseous lesions. There are moderate multilevel degenerative changes in the visualized spine. Bilateral marked hip joint arthritis noted IMPRESSION: 1. Redemonstration of ill-defined heterogeneous hypoattenuating mass centered within the pancreatic tail and distal body, highly concerning for pancreatic neoplasm. 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 2. Redemonstration of multiple hypoattenuating liver lesions, which have increased in size since the prior study, compatible with worsening metastases. 3. There is a new thick-walled cavitary lesion in the left lung lower lobe measuring 2.4 x 2.8 cm. This is indeterminate and differential diagnosis includes metastases versus infectious/inflammatory etiology. Consider dedicated chest imaging. 4. Multiple other nonacute observations, as described above. Aortic Atherosclerosis (ICD10-I70.0). Electronically Signed   By: Beula Brunswick M.D.   On: 12/25/2023 11:37   DG Chest Port 1 View Result Date: 12/25/2023 CLINICAL DATA:  Suspected sepsis EXAM: PORTABLE CHEST 1 VIEW COMPARISON:  09/28/2023 FINDINGS: Numerous leads and wires project over the chest. Patient is rotated to the left. Mildly degraded exam due to AP portable technique and patient body habitus. Mild cardiomegaly. No right-sided pleural effusion. Left costophrenic angle is obscured by overlying breast tissue. IMPRESSION: Degraded exam secondary to AP portable technique and patient body habitus. Especially the inferior left hemithorax is poorly evaluated secondary to overlying soft tissues. Given this limitation, cardiomegaly and low lung volumes without acute disease. If there is a clinical concern of left lower lobe pneumonia, recommend PA and lateral radiographs.  Electronically Signed   By: Lore Rode M.D.   On: 12/25/2023 10:51   IR IMAGING GUIDED PORT INSERTION Result Date: 12/17/2023 INDICATION: Port-A-Cath needed for treatment of metastatic pancreatic cancer. EXAM: FLUOROSCOPIC AND ULTRASOUND GUIDED PLACEMENT OF A SUBCUTANEOUS PORT MEDICATIONS: Fentanyl  50 mcg ANESTHESIA/SEDATION: Patient's level of consciousness and vital signs were monitored continuously by  radiology nurse throughout the procedure under the supervision of the provider performing the procedure. FLUOROSCOPY TIME:  Radiation Exposure Index (as provided by the fluoroscopic device): 17 mGy Kerma COMPLICATIONS: None immediate. PROCEDURE: The procedure, risks, benefits, and alternatives were explained to the patient. Questions regarding the procedure were encouraged and answered. The patient understands and consents to the procedure. Patient was placed supine on the interventional table. Ultrasound confirmed a patent right internal jugular vein. Ultrasound image was saved for documentation. The right chest and neck were cleaned with a skin antiseptic and a sterile drape was placed. Maximal barrier sterile technique was utilized including caps, mask, sterile gowns, sterile gloves, sterile drape, hand hygiene and skin antiseptic. The right neck was anesthetized with 1% lidocaine . Small incision was made in the right neck with a blade. Micropuncture set was placed in the right internal jugular vein with ultrasound guidance. The micropuncture wire was used for measurement purposes. The right chest was anesthetized with 1% lidocaine  with epinephrine . #15 blade was used to make an incision and a subcutaneous port pocket was formed. 8 french Power Port was assembled. Subcutaneous tunnel was formed with a stiff tunneling device. The port catheter was brought through the subcutaneous tunnel. The port was placed in the subcutaneous pocket. The micropuncture set was exchanged for a peel-away sheath. The catheter was  placed through the peel-away sheath and the tip was positioned at the superior cavoatrial junction. Catheter placement was confirmed with fluoroscopy. The port was accessed and flushed with heparinized saline. The port pocket was closed using two layers of absorbable sutures and Dermabond. The vein skin site was closed using a single layer of absorbable suture and Dermabond. Sterile dressings were applied. Patient tolerated the procedure well without an immediate complication. Ultrasound and fluoroscopic images were taken and saved for this procedure. IMPRESSION: Placement of a subcutaneous power-injectable port device. Catheter tip at the superior cavoatrial junction. Electronically Signed   By: Elene Griffes M.D.   On: 12/17/2023 13:13    PERFORMANCE STATUS (ECOG) : 2 - Symptomatic, <50% confined to bed  Review of Systems Unless otherwise noted, a complete review of systems is negative.  Physical Exam General: NAD Cardiovascular: regular rate and rhythm Pulmonary: clear ant fields Abdomen: soft, nontender, + bowel sounds GU: no suprapubic tenderness Extremities: no edema, no joint deformities Skin: no rashes Neurological: Weakness but otherwise nonfocal  IMPRESSION: Posthospital follow-up.  Patient states that she is feeling better.  However, she has had weight gain and increased lower extremity edema.  She is following up with cardiology for this.  No shortness of breath or chest pain.  Patient states symptomatically, she is most concerned about insomnia.  She reports having difficulty both initiating and maintaining sleep.  She asks for something to help her sleep as she often feels daytime fatigue as a result of insomnia.  Will trial low-dose trazodone .  Patient says she is not routinely taking gabapentin  or opioids at bedtime.  Pain is reportedly reasonably well-controlled.  Patient says that she has discussed end-of-life care planning with her family.  She is hoping to have her body  donated to [facility].  Of note, she reversed her CODE STATUS while hospitalized.  Today, she states that she is sure that she would not want to be intubated but was less sure about CPR.  We discussed the probable futility associated with CPR in the setting of terminal cancer.  She says that she will consider this and discuss more with her family.  PLAN: -  Continue current scope of treatment - Continue MS Contin /oxycodone /gabapentin  - Start trazodone  25 to 50 mg nightly as needed - Daily bowel regimen - RTC 1 month   Patient expressed understanding and was in agreement with this plan. She also understands that She can call the clinic at any time with any questions, concerns, or complaints.     Time Total: 20 minutes  Visit consisted of counseling and education dealing with the complex and emotionally intense issues of symptom management and palliative care in the setting of serious and potentially life-threatening illness.Greater than 50%  of this time was spent counseling and coordinating care related to the above assessment and plan.  Signed by: Gerilyn Kobus, PhD, NP-C

## 2024-01-08 NOTE — Progress Notes (Signed)
 Hematology/Oncology Progress note [phone] [phone]      CHIEF COMPLAINTS/PURPOSE OF CONSULTATION:  Stage IV pancreatic carcinoma with liver metastasis.  ASSESSMENT & PLAN:   Pancreatic cancer metastasized to liver [facility]) Imaging findings and different lesion biopsy results were reviewed and discussed with patient. 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  Other differential diagnosis include GI tract adenocarcinoma, cholangiocarcinoma etc. CEA is elevated.  His CA 19-9 is normal. CT and MRI imaging is did not show any GI primary.I recommend a PET scan for further evaluation- patient rescheduled appt initially and later on PET scan was not done due to admission.  Labs are reviewed and discussed with patient. Hold chemotherapy Gemcitabine /Abraxane  -reschedule PET scan Recommend patient continue follow-up with palliative care service.    Neoplasm related pain Recommend patient to continue OxyContin  10 mg every 12 hours, plus oxycodone  5 mg every 4-6 hours as needed -  .   Bilateral lower extremity edema Patient is on diuretics.  Symptom has not improved.  She gained 20 to 30 pounds since last visit. Recent echocardiogram showed normal LVEF. ?  Anasarca versus lymphedema.  Atrial fibrillation with RVR (HCC) Atrial flutter: with RVR. S/p TEE & cardioverison on 12/28/23  Recommend patient to follow-up with cardiology.  Continue metoprolol  and Eliquis     Orders Placed This Encounter  Procedures   Cancer antigen 19-9    Standing Status:   Future    Number of Occurrences:   1    Expected Date:   01/08/2024    Expiration Date:   01/07/2025   Cancer antigen 19-9    Standing Status:   Future    Expected Date:   01/22/2024    Expiration Date:   01/21/2025   CEA    Standing Status:   Future    Expected Date:   01/22/2024    Expiration Date:   01/21/2025   CBC with Differential (Cancer Center Only)    Standing  Status:   Future    Expected Date:   01/22/2024    Expiration Date:   01/21/2025   CMP (Cancer Center only)    Standing Status:   Future    Expected Date:   01/22/2024    Expiration Date:   01/21/2025   Ambulatory referral to Cardiology    Referral Priority:   Urgent    Referral Type:   Consultation    Referral Reason:   Specialty Services Required    Number of Visits Requested:   1   Follow-up 2 weeks All questions were answered. The patient knows to call the clinic with any problems, questions or concerns.  [name], MD, PhD [facility] Hematology Oncology 01/08/2024    HISTORY OF PRESENTING ILLNESS:  Kristina Cruz 63 y.o. female presents to establish care for Stage IV pancreatic cancer I have reviewed her chart and materials related to her cancer extensively and collaborated history with the patient. Summary of oncologic history is as follows: Oncology History  Pancreatic cancer metastasized to liver (HCC)  08/21/2023 Imaging   CT abdomen pelvis with contrast showed 1. Masslike thickening involving the proximal and mid tail of pancreas with relative hypoenhancement measures approximately 3.9 x 5.5 cm. In the absence of signs/symptoms of acute pancreatitis underlying neoplastic process cannot be excluded. Further evaluation with nonemergent contrast enhanced MRI of the abdomen is advised. Note: Given patient's body habitus and the motion artifact observed on the current exam an MRI obtained at this time  is likely to be severely limited and likely nondiagnostic. MRI should be obtained only once the patient is clinically stable, and is able to remain motionless and breath hold. 2. Patchy ground-glass and airspace densities identified within both lower lobes. These are new compared with the previous exam and are concerning for underlying inflammatory/infectious process. 3. Sigmoid diverticulosis without signs of acute diverticulitis. 4. Marked diastasis recti with ventral  herniation of the large and small bowel loops. 5. Periumbilical hernia is containing fat only.   08/24/2023 Imaging   MRI abdomen wo contrast  1. Moderately suboptimal unenhanced exam. 2. Heterogeneous masslike thickening of the pancreatic body/tail again seen. The lesion remains indeterminate on this exam. Differential diagnosis includes pancreatic tumor, autoimmune pancreatitis, mass forming chronic pancreatitis, etc. Please see above for follow-up recommendations. 3. Mild diffuse hepatic steatosis. There are indeterminate areas in the liver, which can also be better evaluated on the contrast-enhanced MRI abdomen. 4. Other observations, as described above.   11/05/2023 Initial Diagnosis   Pancreatic cancer metastasized to liver  Patient was hospitalized from 11/05/2023 - 11/10/2023 She presented to emergency room due to nausea, vomiting and abdominal pain. CT scan showed multiple hypodense hepatic lesions increasing number and size from prior imaging.  Pancreatic tail mass, hypodense lesion in the anterior spleen.  11/09/2023 ultrasound-guided biopsy was obtained. Pathology showed moderately to poorly differentiated adenocarcinoma with extensive areas of tumor necrosis. The adenocarcinoma is positive for cytokeratin 7 and CDX2 (focal).     Cytokeratin 20, TTF-1, and GATA3 are negative. The pattern of immunoreactivity is not specific and the differential diagnosis includes metastatic adenocarcinoma from the pancreaticobiliary tract and upper GI. Intrahepatic       cholangiocarcinoma is also a diagnostic possibility. Given the radiographic  findings a pancreaticobiliary primary is favored and correlation with clinical impression is required.    11/17/2023 Cancer Staging   Staging form: Exocrine Pancreas, AJCC 8th Edition - Clinical stage from 11/17/2023: Stage IV (cT3, cNX, pM1) - Signed by [name], MD on 11/17/2023 Stage prefix: Initial diagnosis    Imaging   CT abdomen pelvis w contrast  1.  Multiple hypodense hepatic lesions, increased in number and size from prior imaging, highly suspicious for metastatic disease. 2. Hypodense lesion in the pancreatic tail measures 7.2 x 3.9 cm, increased in size from prior. The increase in size favors pancreatic neoplasm, with additional etiologies less likely. 3. New hypodense lesion in the anterior spleen, 13 mm, suspicious for metastatic disease. 4. Laxity of the anterior abdominal wall with midline ventral abdominal wall hernia containing small and to a lesser extent large bowel. No bowel obstruction or inflammation. 5. Colonic diverticulosis without diverticulitis.   Aortic Atherosclerosis (ICD10-I70.0)    11/27/2023 -  Chemotherapy   Patient is on Treatment Plan : PANCREATIC Abraxane  D1,8,15 + Gemcitabine  D1,8,15 q28d      Patient would like to get a medi port.  She presents for evaluation prior to chemo.  Recent hospitalization due to Atrial flutter: with RVR. S/p TEE & cardioverison on 12/28/23, severe sepsis, gastroenteritis. Today patient denies any nausea vomiting diarrhea.  No fever or chills.  She feels better however she continues to have severe bilateral lower extremity edema.  She takes Lasix  which did not help her symptoms.   MEDICAL HISTORY:  Past Medical History:  Diagnosis Date   Anemia    Asthma    Back pain    Cataract    Diabetes mellitus without complication (HCC)    GERD (gastroesophageal reflux disease)  History of echocardiogram    Hypertension    Morbid obesity with BMI of 60.0-69.9, adult (HCC)    Restless leg syndrome     SURGICAL HISTORY: Past Surgical History:  Procedure Laterality Date   ABDOMINAL HYSTERECTOMY     CARDIOVERSION N/A 12/28/2023   Procedure: CARDIOVERSION;  Surgeon: Constancia Delton, MD;  Location: [facility] ORS;  Service: Cardiovascular;  Laterality: N/A;   CATARACT EXTRACTION W/PHACO Left 04/13/2020   Procedure: CATARACT EXTRACTION PHACO AND INTRAOCULAR LENS PLACEMENT (IOC);   Surgeon: Ola Berger, MD;  Location: [facility] ORS;  Service: Ophthalmology;  Laterality: Left;  US  00:36.0 CDE 5.95 Fluid Pack lot # [id number]   HYSTEROSCOPY WITH D & C N/A 07/14/2017   Procedure: DILATATION AND CURETTAGE /HYSTEROSCOPY;  Surgeon: Alben Alma, MD;  Location: [facility] ORS;  Service: Gynecology;  Laterality: N/A;   IR IMAGING GUIDED PORT INSERTION  12/17/2023   POLYPECTOMY  2015   TEE WITHOUT CARDIOVERSION N/A 12/28/2023   Procedure: ECHOCARDIOGRAM, TRANSESOPHAGEAL;  Surgeon: Constancia Delton, MD;  Location: [facility] ORS;  Service: Cardiovascular;  Laterality: N/A;    SOCIAL HISTORY: Social History   Socioeconomic History   Marital status: Single    Spouse name: Not on file   Number of children: Not on file   Years of education: Not on file   Highest education level: Not on file  Occupational History   Not on file  Tobacco Use   Smoking status: Never   Smokeless tobacco: Never  Vaping Use   Vaping status: Never Used  Substance and Sexual Activity   Alcohol use: No   Drug use: No   Sexual activity: Never    Birth control/protection: None  Other Topics Concern   Not on file  Social History Narrative   Not on file   Social Drivers of Health   Financial Resource Strain: Low Risk  (09/26/2022)   Received from [facility], [facility]   Overall Financial Resource Strain (CARDIA)    Difficulty of Paying Living Expenses: Not hard at all  Food Insecurity: No Food Insecurity (12/25/2023)   Hunger Vital Sign    Worried About Running Out of Food in the Last Year: Never true    Ran Out of Food in the Last Year: Never true  Transportation Needs: No Transportation Needs (12/25/2023)   PRAPARE - Administrator, Civil Service (Medical): No    Lack of Transportation (Non-Medical): No  Recent Concern: Transportation Needs - Unmet Transportation Needs (09/28/2023)   PRAPARE - Administrator, Civil Service (Medical): Yes     Lack of Transportation (Non-Medical): Yes  Physical Activity: Not on file  Stress: Not on file  Social Connections: Socially Isolated (12/25/2023)   Social Connection and Isolation Panel [NHANES]    Frequency of Communication with Friends and Family: More than three times a week    Frequency of Social Gatherings with Friends and Family: More than three times a week    Attends Religious Services: Never    Database administrator or Organizations: No    Attends Banker Meetings: Never    Marital Status: Never married  Intimate Partner Violence: Not At Risk (12/25/2023)   Humiliation, Afraid, Rape, and Kick questionnaire    Fear of Current or Ex-Partner: No    Emotionally Abused: No    Physically Abused: No    Sexually Abused: No    FAMILY HISTORY: Family History  Problem Relation Age of Onset  Breast cancer Mother 26   Diabetes Mother    Hypertension Mother    Ovarian cancer Paternal Aunt        ?   Diabetes Father    Hypertension Father     ALLERGIES:  is allergic to peanut-containing drug products.  MEDICATIONS:  Current Outpatient Medications  Medication Sig Dispense Refill   acetaminophen  (TYLENOL  8 HOUR ARTHRITIS PAIN) 650 MG CR tablet Take 3 tablets (1,950 mg total) by mouth every 8 (eight) hours as needed for pain. Do not exceed 4000 mg total in a day.  Home med.     apixaban  (ELIQUIS ) 5 MG TABS tablet Take 1 tablet (5 mg total) by mouth 2 (two) times daily. 60 tablet 0   azelastine (ASTELIN) 0.1 % nasal spray Place 1 spray into both nostrils 2 (two) times daily.     diphenoxylate -atropine  (LOMOTIL ) 2.5-0.025 MG tablet Take 2 tablets by mouth every 6 (six) hours as needed (for diarrhea unrelieved by imodium). 90 tablet 0   EPINEPHrine  0.3 mg/0.3 mL IJ SOAJ injection Inject 0.3 mg into the muscle as needed for anaphylaxis. 1 each 0   fluticasone  (FLONASE ) 50 MCG/ACT nasal spray Place 1 spray into both nostrils daily. 9.9 mL 0   fluticasone  furoate-vilanterol  (BREO ELLIPTA ) 100-25 MCG/ACT AEPB Inhale 1 puff into the lungs daily. 28 each 1   furosemide  (LASIX ) 40 MG tablet Take 40 mg by mouth every other day.     gabapentin  (NEURONTIN ) 100 MG capsule Take 1 capsule (100 mg total) by mouth 3 (three) times daily. 30 capsule 0   ipratropium-albuterol  (DUONEB) 0.5-2.5 (3) MG/3ML SOLN Take 3 mLs by nebulization every 4 (four) hours as needed (wheezing / shortness of breath). 360 mL 1   levocetirizine (XYZAL) 5 MG tablet Take 5 mg by mouth daily.     lidocaine -prilocaine (EMLA) cream Apply 1 Application topically as needed. Apply to port and cover with saran wrap 1-2 hours prior to port access 30 g 1   magic mouthwash (lidocaine , diphenhydrAMINE , alum & mag hydroxide) suspension Swish and swallow 5 mLs 4 (four) times daily as needed for mouth pain. 360 mL 1   metFORMIN  (GLUCOPHAGE ) 1000 MG tablet Take 1,000 mg by mouth 2 (two) times daily.     metoprolol  tartrate (LOPRESSOR ) 25 MG tablet Take 1 tablet (25 mg total) by mouth 2 (two) times daily. 60 tablet 0   morphine  (MS CONTIN ) 15 MG 12 hr tablet Take 1 tablet (15 mg total) by mouth every 12 (twelve) hours. 30 tablet 0   naloxone  (NARCAN ) nasal spray 4 mg/0.1 mL SPRAY 1 SPRAY INTO ONE NOSTRIL AS DIRECTED FOR OPIOID OVERDOSE (TURN PERSON ON SIDE AFTER DOSE. IF NO RESPONSE IN 2-3 MINUTES OR PERSON RESPONDS BUT RELAPSES, REPEAT USING A NEW SPRAY DEVICE AND SPRAY INTO THE OTHER NOSTRIL. CALL 911 AFTER USE.) * EMERGENCY USE ONLY * 1 each 0   ondansetron  (ZOFRAN -ODT) 4 MG disintegrating tablet Take 1 tablet (4 mg total) by mouth every 8 (eight) hours as needed for nausea or vomiting. 45 tablet 0   oxyCODONE  (OXY IR/ROXICODONE ) 5 MG immediate release tablet Take 1 tablet (5 mg total) by mouth every 4 (four) hours as needed for moderate pain (pain score 4-6) or severe pain (pain score 7-10). 90 tablet 0   potassium chloride  SA (KLOR-CON  M) 20 MEQ tablet Take 1 tablet (20 mEq total) by mouth daily. 3 tablet 0    prochlorperazine  (COMPAZINE ) 10 MG tablet Take 1 tablet (10 mg total) by  mouth every 6 (six) hours as needed for nausea or vomiting. 30 tablet 1   rOPINIRole  (REQUIP ) 2 MG tablet Take 2 mg by mouth in the morning and at bedtime.      Semaglutide, 1 MG/DOSE, 4 MG/3ML SOPN Inject 1 mg into the skin once a week.     VENTOLIN  HFA 108 (90 Base) MCG/ACT inhaler Inhale 2 puffs into the lungs every 4 (four) hours as needed for wheezing or shortness of breath. 18 g 1   traZODone  (DESYREL ) 50 MG tablet Take 0.5-1 tablets (25-50 mg total) by mouth at bedtime as needed for sleep. 30 tablet 2   No current facility-administered medications for this visit.    Review of Systems  Constitutional:  Positive for fatigue. Negative for appetite change, chills and fever.  HENT:   Negative for hearing loss and voice change.   Eyes:  Negative for eye problems.  Respiratory:  Negative for chest tightness, cough and shortness of breath.   Cardiovascular:  Positive for leg swelling. Negative for chest pain.  Gastrointestinal:  Positive for abdominal pain and nausea. Negative for abdominal distention and blood in stool.  Endocrine: Negative for hot flashes.  Genitourinary:  Negative for difficulty urinating and frequency.   Musculoskeletal:  Negative for arthralgias.  Skin:  Negative for itching and rash.  Neurological:  Negative for extremity weakness.  Hematological:  Negative for adenopathy.  Psychiatric/Behavioral:  Negative for confusion.      PHYSICAL EXAMINATION: ECOG PERFORMANCE STATUS: 1 - Symptomatic but completely ambulatory  Vitals:   01/08/24 0912  BP: (!) 140/81  Pulse: 79  Resp: 18  Temp: (!) 97.3 F (36.3 C)  SpO2: 97%   Filed Weights   01/08/24 0912  Weight: 287 lb (130.2 kg)    Physical Exam Constitutional:      General: She is not in acute distress.    Appearance: She is obese. She is not diaphoretic.  HENT:     Head: Normocephalic and atraumatic.  Eyes:     General: No  scleral icterus. Cardiovascular:     Rate and Rhythm: Normal rate and regular rhythm.  Pulmonary:     Effort: Pulmonary effort is normal. No respiratory distress.     Breath sounds: Rales present. No wheezing.     Comments: Decreased breath sound bilaterally Abdominal:     General: Bowel sounds are normal. There is no distension.     Palpations: Abdomen is soft.  Musculoskeletal:        General: Swelling present. Normal range of motion.     Cervical back: Normal range of motion and neck supple.     Right lower leg: Edema present.     Left lower leg: Edema present.  Skin:    General: Skin is warm and dry.     Findings: No erythema.  Neurological:     Mental Status: She is alert and oriented to person, place, and time. Mental status is at baseline.     Motor: No abnormal muscle tone.  Psychiatric:        Mood and Affect: Mood and affect normal.      LABORATORY DATA:  I have reviewed the data as listed    Latest Ref Rng & Units 01/08/2024    8:39 AM 12/30/2023    4:25 AM 12/29/2023    5:20 AM  CBC  WBC 4.0 - 10.5 K/uL 7.3  19.4  22.8   Hemoglobin 12.0 - 15.0 g/dL 8.9  8.4  8.6  Hematocrit 36.0 - 46.0 % 27.4  25.0  26.2   Platelets 150 - 400 K/uL 240  189  203       Latest Ref Rng & Units 01/08/2024    8:39 AM 12/30/2023    4:25 AM 12/29/2023    5:20 AM  CMP  Glucose 70 - 99 mg/dL 308  657  846   BUN 8 - 23 mg/dL 8  12  13    Creatinine 0.44 - 1.00 mg/dL 9.62  9.52  8.41   Sodium 135 - 145 mmol/L 139  136  138   Potassium 3.5 - 5.1 mmol/L 3.9  3.6  3.9   Chloride 98 - 111 mmol/L 105  107  105   CO2 22 - 32 mmol/L 25  22  24    Calcium  8.9 - 10.3 mg/dL 8.2  7.9  7.9   Total Protein 6.5 - 8.1 g/dL 6.5     Total Bilirubin 0.0 - 1.2 mg/dL 0.6     Alkaline Phos 38 - 126 U/L 123     AST 15 - 41 U/L 21     ALT 0 - 44 U/L 15        RADIOGRAPHIC STUDIES: I have personally reviewed the radiological images as listed and agreed with the findings in the report. DG HIPS BILAT  WITH PELVIS 3-4 VIEWS Result Date: 12/29/2023 CLINICAL DATA:  Bilateral hip pain. EXAM: DG HIP (WITH OR WITHOUT PELVIS) 3-4V BILAT COMPARISON:  None Available. FINDINGS: There is no evidence of hip fracture or dislocation. Severe narrowing and osteophyte formation is seen involving both hips. 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 No acute abnormality seen. Electronically Signed   By: Rosalene Colon M.D.   On: 12/29/2023 17:29   ECHO TEE Result Date: 12/28/2023    TRANSESOPHOGEAL ECHO REPORT   Patient Name:   Marveline Kazanjian Date of Exam: 12/28/2023 Medical Rec #:  [id number]          Height:       62.0 in Accession #:    [id number]         Weight:       248.2 lb Date of Birth:  [birth year]          BSA:          2.096 m Patient Age:    63 years           BP:           83/54 mmHg Patient Gender: F                  HR:           85 bpm. Exam Location:  [facility] Procedure: Transesophageal Echo, Color Doppler and Cardiac Doppler (Both            Spectral and Color Flow Doppler were utilized during procedure). Indications:     Atrial Fibrillation I48.91  History:         Patient has prior history of Echocardiogram examinations, most                  recent 12/26/2023. Risk Factors:Diabetes and Hypertension. GERD.  Sonographer:     Broadus Canes Referring Phys:  [id number] Hertz BERGE Diagnosing Phys: Constancia Delton MD PROCEDURE: The transesophogeal probe was passed without difficulty through the esophogus of the patient. Sedation performed by different physician. The patient developed no complications during the procedure.  IMPRESSIONS  1. Left ventricular ejection fraction,  by estimation, is 55 to 60%. The left ventricle has normal function.  2. Right ventricular systolic function is normal. The right ventricular size is normal.  3. No left atrial/left atrial appendage thrombus was detected.  4. The mitral valve is normal in structure. Trivial mitral valve regurgitation.  5. Tricuspid valve  regurgitation is mild to moderate.  6. The aortic valve is tricuspid. Aortic valve regurgitation is not visualized. FINDINGS  Left Ventricle: Left ventricular ejection fraction, by estimation, is 55 to 60%. The left ventricle has normal function. The left ventricular internal cavity size was normal in size. Right Ventricle: The right ventricular size is normal. No increase in right ventricular wall thickness. Right ventricular systolic function is normal. Left Atrium: Left atrial size was normal in size. No left atrial/left atrial appendage thrombus was detected. Right Atrium: Right atrial size was normal in size. Pericardium: There is no evidence of pericardial effusion. Mitral Valve: The mitral valve is normal in structure. Trivial mitral valve regurgitation. Tricuspid Valve: The tricuspid valve is normal in structure. Tricuspid valve regurgitation is mild to moderate. Aortic Valve: The aortic valve is tricuspid. Aortic valve regurgitation is not visualized. Pulmonic Valve: The pulmonic valve was grossly normal. Pulmonic valve regurgitation is trivial. Aorta: The aortic root is normal in size and structure. IAS/Shunts: No atrial level shunt detected by color flow Doppler. Constancia Delton MD Electronically signed by Constancia Delton MD Signature Date/Time: 12/28/2023/4:39:22 PM    Final    ECHOCARDIOGRAM COMPLETE Result Date: 12/26/2023    ECHOCARDIOGRAM REPORT   Patient Name:   Breuna Haynie Date of Exam: 12/26/2023 Medical Rec #:  [id number]          Height:       62.0 in Accession #:    [id number]         Weight:       248.2 lb Date of Birth:  [birth year]          BSA:          2.096 m Patient Age:    63 years           BP:           91/66 mmHg Patient Gender: F                  HR:           111 bpm. Exam Location:  [facility] Procedure: 2D Echo (Both Spectral and Color Flow Doppler were utilized during            procedure). Indications:     CHF I50.31  History:         Patient has prior history of  Echocardiogram examinations, most                  recent 02/12/2023.  Sonographer:     Clenton Czech RDCS, FASE Referring Phys:  [id number] Avi Body Diagnosing Phys: Belva Boyden MD  Sonographer Comments: Technically challenging study due to limited acoustic windows, suboptimal apical window and patient is obese. Image acquisition challenging due to patient body habitus and Image acquisition challenging due to uncooperative patient. The patient declined Definity  IV ultrasound imaging agent. IMPRESSIONS  1. Left ventricular ejection fraction, by estimation, is 55 to 60%. Left ventricular ejection fraction by PLAX is 51 %. The left ventricle has normal function. The left ventricle has no regional wall motion abnormalities. There is moderate left ventricular hypertrophy. Left ventricular diastolic parameters are indeterminate.  2. Right ventricular systolic function  is mildly reduced. The right ventricular size is normal. Tricuspid regurgitation signal is inadequate for assessing PA pressure.  3. The mitral valve is normal in structure. No evidence of mitral valve regurgitation. No evidence of mitral stenosis.  4. The aortic valve is normal in structure. Aortic valve regurgitation is not visualized. No aortic stenosis is present.  5. The inferior vena cava is normal in size with greater than 50% respiratory variability, suggesting right atrial pressure of 3 mmHg. FINDINGS  Left Ventricle: Left ventricular ejection fraction, by estimation, is 55 to 60%. Left ventricular ejection fraction by PLAX is 51 %. The left ventricle has normal function. The left ventricle has no regional wall motion abnormalities. Strain was performed and the global longitudinal strain is indeterminate. The left ventricular internal cavity size was normal in size. There is moderate left ventricular hypertrophy. Left ventricular diastolic parameters are indeterminate. Right Ventricle: The right ventricular size is normal. No increase in  right ventricular wall thickness. Right ventricular systolic function is mildly reduced. Tricuspid regurgitation signal is inadequate for assessing PA pressure. Left Atrium: Left atrial size was normal in size. Right Atrium: Right atrial size was normal in size. Pericardium: There is no evidence of pericardial effusion. Mitral Valve: The mitral valve is normal in structure. No evidence of mitral valve regurgitation. No evidence of mitral valve stenosis. Tricuspid Valve: The tricuspid valve is normal in structure. Tricuspid valve regurgitation is not demonstrated. No evidence of tricuspid stenosis. Aortic Valve: The aortic valve is normal in structure. Aortic valve regurgitation is not visualized. No aortic stenosis is present. Pulmonic Valve: The pulmonic valve was normal in structure. Pulmonic valve regurgitation is not visualized. No evidence of pulmonic stenosis. Aorta: The aortic root is normal in size and structure. Venous: The inferior vena cava is normal in size with greater than 50% respiratory variability, suggesting right atrial pressure of 3 mmHg. IAS/Shunts: No atrial level shunt detected by color flow Doppler. Additional Comments: 3D was performed not requiring image post processing on an independent workstation and was indeterminate.  LEFT VENTRICLE PLAX 2D LV EF:         Left ventricular ejection fraction by PLAX is 51 %. LVIDd:         4.60 cm LVIDs:         3.40 cm LV PW:         1.40 cm LV IVS:        1.40 cm LVOT diam:     1.90 cm LVOT Area:     2.84 cm  LEFT ATRIUM         Index LA diam:    3.30 cm 1.57 cm/m                        PULMONIC VALVE AORTA                 PV Vmax:       0.98 m/s Ao Root diam: 3.90 cm PV Peak grad:  3.8 mmHg Ao Asc diam:  3.00 cm  MITRAL VALVE               TRICUSPID VALVE MV Area (PHT): 4.46 cm    TR Peak grad:   13.0 mmHg MV Decel Time: 170 msec    TR Vmax:        180.00 cm/s MV E velocity: 98.50 cm/s MV A velocity: 68.70 cm/s  SHUNTS MV E/A ratio:  1.43  Systemic Diam: 1.90 cm Belva Boyden MD Electronically signed by Belva Boyden MD Signature Date/Time: 12/26/2023/4:06:59 PM    Final    US  Venous Img Lower Bilateral (DVT) Result Date: 12/25/2023 CLINICAL DATA:  Lower extremity edema for 2 weeks EXAM: Bilateral Lower Extremity Venous Doppler Ultrasound TECHNIQUE: Gray-scale sonography with compression, as well as color and duplex ultrasound, were performed to evaluate the deep venous system(s) from the level of the common femoral vein through the popliteal and proximal calf veins. COMPARISON:  None available FINDINGS: VENOUS Normal compressibility of the common femoral, superficial femoral, and popliteal veins, as well as the visualized calf veins. Visualized portions of profunda femoral vein and great saphenous vein unremarkable. No filling defects to suggest DVT on grayscale or color Doppler imaging. Doppler waveforms show normal direction of venous flow, normal respiratory plasticity and response to augmentation. OTHER None. Limitations: none IMPRESSION: No lower extremity DVT. Electronically Signed   By: Elester Grim M.D.   On: 12/25/2023 17:29   CT CHEST WO CONTRAST Result Date: 12/25/2023 CLINICAL DATA:  Bilateral lower extremity edema. History of pancreatic cancer. EXAM: CT CHEST WITHOUT CONTRAST TECHNIQUE: Multidetector CT imaging of the chest was performed following the standard protocol without IV contrast. RADIATION DOSE REDUCTION: This exam was performed according to the departmental dose-optimization program which includes automated exposure control, adjustment of the mA and/or kV according to patient size and/or use of iterative reconstruction technique. COMPARISON:  August 21, 2023. FINDINGS: Cardiovascular: Atherosclerosis of thoracic aorta without aneurysm formation. Mild cardiomegaly. No pericardial effusion. Right internal jugular Port-A-Cath is noted. Mediastinum/Nodes: No enlarged mediastinal or axillary lymph nodes. Thyroid  gland,  trachea, and esophagus demonstrate no significant findings. Lungs/Pleura: No pneumothorax or pleural effusion is noted. Right lung is clear. 3.5 x 2.6 cm cavitating lesion is noted in left lower lobe which may represent cavitary pneumonia or possibly metastatic disease. Upper Abdomen: Interval development of multiple rounded low densities throughout the left and right hepatic lobes consistent with metastatic disease. Pancreatic mass is again noted consistent with history of pancreatic malignancy. Musculoskeletal: No chest wall mass or suspicious bone lesions identified. IMPRESSION: Interval development of multiple rounded hepatic low densities consistent with metastatic disease. Pancreatic mass is again noted consistent with history of pancreatic malignancy. 3.5 x 2.6 cm cavitating lesion is noted in left lower lobe which may represent cavitary pneumonia or possibly metastatic disease. Electronically Signed   By: Rosalene Colon M.D.   On: 12/25/2023 13:53   CT ABDOMEN PELVIS W CONTRAST Result Date: 12/25/2023 CLINICAL DATA:  Sepsis. * Tracking Code: BO * EXAM: CT ABDOMEN AND PELVIS WITH CONTRAST TECHNIQUE: Multidetector CT imaging of the abdomen and pelvis was performed using the standard protocol following bolus administration of intravenous contrast. RADIATION DOSE REDUCTION: This exam was performed according to the departmental dose-optimization program which includes automated exposure control, adjustment of the mA and/or kV according to patient size and/or use of iterative reconstruction technique. CONTRAST:  OMNIPAQUE  IOHEXOL  300 MG/ML  SOLN COMPARISON:  CT scan abdomen and pelvis from 11/05/2023. FINDINGS: Lower chest: Since the prior study, there is a new thick-walled cavitary lesion in the left lung lower lobe measuring 2.4 x 2.8 cm. Visualized bilateral lung bases are otherwise clear. No pleural effusion. Normal heart size. No pericardial effusion. Hepatobiliary: The liver is normal in size.  Non-cirrhotic configuration. Redemonstration of multiple (more than 25), hypoattenuating liver lesions with dominant lesion in the right hepatic lobe measuring up to 2.2 x 2.3 cm, which previously measured up to  1.5 x 1.8 cm. Findings are compatible with worsening metastases. No intrahepatic or extrahepatic bile duct dilation. Gallbladder is surgically absent. Pancreas: Redemonstration of ill-defined heterogeneous predominantly hypoattenuating mass centered within the pancreatic tail and distal body measuring approximately 4.2 x 7.4 cm, grossly unchanged since the prior study and highly concerning for pancreatic neoplasm. Rest of the pancreas is unremarkable. Main pancreatic duct is not dilated. No peripancreatic fat stranding. 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 Spleen: Normal size spleen. Redemonstration of a hypoattenuating 2.2 x 2.6 cm lesion along the superolateral aspect, which has increased in size since the prior study. There is a probable new sub 5 mm subcapsular focus in the posterosuperior aspect, which is concerning for new metastases. Adrenals/Urinary Tract: Adrenal glands are unremarkable. No suspicious renal mass. No hydronephrosis. No renal or ureteric calculi. Unremarkable urinary bladder. Stomach/Bowel: No disproportionate dilation of the small or large bowel loops. No evidence of abnormal bowel wall thickening or inflammatory changes. The appendix is unremarkable. There are multiple diverticula mainly in the sigmoid colon, without imaging signs of diverticulitis. Vascular/Lymphatic: No ascites or pneumoperitoneum. No abdominal or pelvic lymphadenopathy, by size criteria. No aneurysmal dilation of the major abdominal arteries. There are mild peripheral atherosclerotic vascular calcifications of the aorta and its major branches. Reproductive: The uterus is surgically absent. No large adnexal mass. Other: Note is again made of  thinned/lax anterior abdominal wall. There is a small fat containing right paramedian periumbilical hernia. The soft tissues and abdominal wall are otherwise unremarkable. Musculoskeletal: No suspicious osseous lesions. There are moderate multilevel degenerative changes in the visualized spine. Bilateral marked hip joint arthritis noted IMPRESSION: 1. Redemonstration of ill-defined heterogeneous hypoattenuating mass centered within the pancreatic tail and distal body, highly concerning for pancreatic neoplasm. 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 2. Redemonstration of multiple hypoattenuating liver lesions, which have increased in size since the prior study, compatible with worsening metastases. 3. There is a new thick-walled cavitary lesion in the left lung lower lobe measuring 2.4 x 2.8 cm. This is indeterminate and differential diagnosis includes metastases versus infectious/inflammatory etiology. Consider dedicated chest imaging. 4. Multiple other nonacute observations, as described above. Aortic Atherosclerosis (ICD10-I70.0). Electronically Signed   By: Beula Brunswick M.D.   On: 12/25/2023 11:37   DG Chest Port 1 View Result Date: 12/25/2023 CLINICAL DATA:  Suspected sepsis EXAM: PORTABLE CHEST 1 VIEW COMPARISON:  09/28/2023 FINDINGS: Numerous leads and wires project over the chest. Patient is rotated to the left. Mildly degraded exam due to AP portable technique and patient body habitus. Mild cardiomegaly. No right-sided pleural effusion. Left costophrenic angle is obscured by overlying breast tissue. IMPRESSION: Degraded exam secondary to AP portable technique and patient body habitus. Especially the inferior left hemithorax is poorly evaluated secondary to overlying soft tissues. Given this limitation, cardiomegaly and low lung volumes without acute disease. If there is a clinical concern of left lower lobe pneumonia, recommend PA and  lateral radiographs. Electronically Signed   By: Lore Rode M.D.   On: 12/25/2023 10:51   IR IMAGING GUIDED PORT INSERTION Result Date: 12/17/2023 INDICATION: Port-A-Cath needed for treatment of metastatic pancreatic cancer. EXAM: FLUOROSCOPIC AND ULTRASOUND GUIDED PLACEMENT OF A SUBCUTANEOUS PORT MEDICATIONS: Fentanyl  50 mcg ANESTHESIA/SEDATION: Patient's level of consciousness and vital signs were monitored continuously by radiology nurse throughout the procedure under the supervision of the provider performing the procedure. FLUOROSCOPY TIME:  Radiation Exposure Index (as provided by the fluoroscopic device): 17 mGy Kerma COMPLICATIONS: None immediate. PROCEDURE: The procedure, risks, benefits, and alternatives were explained to the patient. Questions regarding the procedure were encouraged and answered. The patient understands and consents to the procedure. Patient was placed supine on the interventional table. Ultrasound confirmed a patent right internal jugular vein. Ultrasound image was saved for documentation. The right chest and neck were cleaned with a skin antiseptic and a sterile drape was placed. Maximal barrier sterile technique was utilized including caps, mask, sterile gowns, sterile gloves, sterile drape, hand hygiene and skin antiseptic. The right neck was anesthetized with 1% lidocaine . Small incision was made in the right neck with a blade. Micropuncture set was placed in the right internal jugular vein with ultrasound guidance. The micropuncture wire was used for measurement purposes. The right chest was anesthetized with 1% lidocaine  with epinephrine . #15 blade was used to make an incision and a subcutaneous port pocket was formed. 8 french Power Port was assembled. Subcutaneous tunnel was formed with a stiff tunneling device. The port catheter was brought through the subcutaneous tunnel. The port was placed in the subcutaneous pocket. The micropuncture set was exchanged for a peel-away  sheath. The catheter was placed through the peel-away sheath and the tip was positioned at the superior cavoatrial junction. Catheter placement was confirmed with fluoroscopy. The port was accessed and flushed with heparinized saline. The port pocket was closed using two layers of absorbable sutures and Dermabond. The vein skin site was closed using a single layer of absorbable suture and Dermabond. Sterile dressings were applied. Patient tolerated the procedure well without an immediate complication. Ultrasound and fluoroscopic images were taken and saved for this procedure. IMPRESSION: Placement of a subcutaneous power-injectable port device. Catheter tip at the superior cavoatrial junction. Electronically Signed   By: Elene Griffes M.D.   On: 12/17/2023 13:13

## 2024-01-08 NOTE — Patient Instructions (Signed)
 Cardiology : Dr. Carl Charnley- [place]  Ph: [phone]

## 2024-01-08 NOTE — Assessment & Plan Note (Addendum)
 Imaging findings and different lesion biopsy results were reviewed and discussed with patient. 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  Other differential diagnosis include GI tract adenocarcinoma, cholangiocarcinoma etc. CEA is elevated.  His CA 19-9 is normal. CT and MRI imaging is did not show any GI primary.I recommend a PET scan for further evaluation- patient rescheduled appt initially and later on PET scan was not done due to admission.  Labs are reviewed and discussed with patient. Hold chemotherapy Gemcitabine /Abraxane  -reschedule PET scan Recommend patient continue follow-up with palliative care service.

## 2024-01-08 NOTE — Assessment & Plan Note (Signed)
 Recommend patient to continue OxyContin 10 mg every 12 hours, plus oxycodone 5 mg every 4-6 hours as needed

## 2024-01-10 LAB — CANCER ANTIGEN 19-9: CA 19-9: 3 U/mL (ref 0–35)

## 2024-01-15 ENCOUNTER — Ambulatory Visit

## 2024-01-15 ENCOUNTER — Ambulatory Visit: Admitting: Oncology

## 2024-01-15 ENCOUNTER — Other Ambulatory Visit

## 2024-01-20 NOTE — Addendum Note (Signed)
 Encounter addended by: Karolynn Pack on: 01/20/2024 1:07 PM  Actions taken: Imaging Exam ended

## 2024-01-20 NOTE — Progress Notes (Signed)
 Spoke with patient on 01/20/2024 and she declined services.  Referral has been closed.  [facility] remains available should the patient express interest in the future.

## 2024-01-21 ENCOUNTER — Encounter: Payer: Self-pay | Admitting: *Deleted

## 2024-01-22 ENCOUNTER — Inpatient Hospital Stay

## 2024-01-22 ENCOUNTER — Encounter: Payer: Self-pay | Admitting: Oncology

## 2024-01-22 ENCOUNTER — Inpatient Hospital Stay: Attending: Oncology

## 2024-01-22 ENCOUNTER — Inpatient Hospital Stay (HOSPITAL_BASED_OUTPATIENT_CLINIC_OR_DEPARTMENT_OTHER): Admitting: Oncology

## 2024-01-22 VITALS — BP 149/76 | HR 69 | Temp 98.4°F | Resp 17

## 2024-01-22 VITALS — BP 156/83 | HR 65 | Temp 96.4°F | Resp 18 | Ht 62.0 in | Wt 289.0 lb

## 2024-01-22 DIAGNOSIS — R600 Localized edema: Secondary | ICD-10-CM | POA: Diagnosis not present

## 2024-01-22 DIAGNOSIS — I4891 Unspecified atrial fibrillation: Secondary | ICD-10-CM | POA: Insufficient documentation

## 2024-01-22 DIAGNOSIS — Z7901 Long term (current) use of anticoagulants: Secondary | ICD-10-CM | POA: Insufficient documentation

## 2024-01-22 DIAGNOSIS — C252 Malignant neoplasm of tail of pancreas: Secondary | ICD-10-CM | POA: Diagnosis not present

## 2024-01-22 DIAGNOSIS — Z5111 Encounter for antineoplastic chemotherapy: Secondary | ICD-10-CM

## 2024-01-22 DIAGNOSIS — G893 Neoplasm related pain (acute) (chronic): Secondary | ICD-10-CM | POA: Diagnosis not present

## 2024-01-22 DIAGNOSIS — C787 Secondary malignant neoplasm of liver and intrahepatic bile duct: Secondary | ICD-10-CM | POA: Diagnosis not present

## 2024-01-22 DIAGNOSIS — C259 Malignant neoplasm of pancreas, unspecified: Secondary | ICD-10-CM

## 2024-01-22 DIAGNOSIS — T451X5A Adverse effect of antineoplastic and immunosuppressive drugs, initial encounter: Secondary | ICD-10-CM

## 2024-01-22 DIAGNOSIS — G620 Drug-induced polyneuropathy: Secondary | ICD-10-CM | POA: Diagnosis not present

## 2024-01-22 DIAGNOSIS — Z79899 Other long term (current) drug therapy: Secondary | ICD-10-CM | POA: Diagnosis not present

## 2024-01-22 DIAGNOSIS — I509 Heart failure, unspecified: Secondary | ICD-10-CM | POA: Diagnosis not present

## 2024-01-22 DIAGNOSIS — I4892 Unspecified atrial flutter: Secondary | ICD-10-CM | POA: Diagnosis not present

## 2024-01-22 DIAGNOSIS — Z8041 Family history of malignant neoplasm of ovary: Secondary | ICD-10-CM | POA: Insufficient documentation

## 2024-01-22 DIAGNOSIS — Z803 Family history of malignant neoplasm of breast: Secondary | ICD-10-CM | POA: Insufficient documentation

## 2024-01-22 LAB — CBC WITH DIFFERENTIAL (CANCER CENTER ONLY)
Abs Immature Granulocytes: 0.04 10*3/uL (ref 0.00–0.07)
Basophils Absolute: 0.1 10*3/uL (ref 0.0–0.1)
Basophils Relative: 1 %
Eosinophils Absolute: 0.3 10*3/uL (ref 0.0–0.5)
Eosinophils Relative: 4 %
HCT: 31.4 % — ABNORMAL LOW (ref 36.0–46.0)
Hemoglobin: 9.9 g/dL — ABNORMAL LOW (ref 12.0–15.0)
Immature Granulocytes: 1 %
Lymphocytes Relative: 20 %
Lymphs Abs: 1.4 10*3/uL (ref 0.7–4.0)
MCH: 29.4 pg (ref 26.0–34.0)
MCHC: 31.5 g/dL (ref 30.0–36.0)
MCV: 93.2 fL (ref 80.0–100.0)
Monocytes Absolute: 0.5 10*3/uL (ref 0.1–1.0)
Monocytes Relative: 8 %
Neutro Abs: 4.5 10*3/uL (ref 1.7–7.7)
Neutrophils Relative %: 66 %
Platelet Count: 236 10*3/uL (ref 150–400)
RBC: 3.37 MIL/uL — ABNORMAL LOW (ref 3.87–5.11)
RDW: 21.2 % — ABNORMAL HIGH (ref 11.5–15.5)
WBC Count: 6.8 10*3/uL (ref 4.0–10.5)
nRBC: 0 % (ref 0.0–0.2)

## 2024-01-22 LAB — CMP (CANCER CENTER ONLY)
ALT: 10 U/L (ref 0–44)
AST: 14 U/L — ABNORMAL LOW (ref 15–41)
Albumin: 2.9 g/dL — ABNORMAL LOW (ref 3.5–5.0)
Alkaline Phosphatase: 101 U/L (ref 38–126)
Anion gap: 8 (ref 5–15)
BUN: 9 mg/dL (ref 8–23)
CO2: 27 mmol/L (ref 22–32)
Calcium: 8.2 mg/dL — ABNORMAL LOW (ref 8.9–10.3)
Chloride: 103 mmol/L (ref 98–111)
Creatinine: 0.97 mg/dL (ref 0.44–1.00)
GFR, Estimated: >60 mL/min (ref >60)
Glucose, Bld: 149 mg/dL — ABNORMAL HIGH (ref 70–99)
Potassium: 3.5 mmol/L (ref 3.5–5.1)
Sodium: 138 mmol/L (ref 135–145)
Total Bilirubin: 0.9 mg/dL (ref 0.0–1.2)
Total Protein: 7.1 g/dL (ref 6.5–8.1)

## 2024-01-22 MED ORDER — SODIUM CHLORIDE 0.9 % IV SOLN
900.0000 mg/m2 | Freq: Once | INTRAVENOUS | Status: AC
  Administered 2024-01-22: 2090 mg via INTRAVENOUS
  Filled 2024-01-22: qty 54.97

## 2024-01-22 MED ORDER — PACLITAXEL PROTEIN-BOUND CHEMO INJECTION 100 MG
100.0000 mg/m2 | Freq: Once | INTRAVENOUS | Status: AC
  Administered 2024-01-22: 225 mg via INTRAVENOUS
  Filled 2024-01-22: qty 45

## 2024-01-22 MED ORDER — HEPARIN SOD (PORK) LOCK FLUSH 100 UNIT/ML IV SOLN
500.0000 [IU] | Freq: Once | INTRAVENOUS | Status: AC | PRN
  Administered 2024-01-22: 500 [IU]
  Filled 2024-01-22: qty 5

## 2024-01-22 MED ORDER — PROCHLORPERAZINE MALEATE 10 MG PO TABS
10.0000 mg | ORAL_TABLET | Freq: Once | ORAL | Status: AC
  Administered 2024-01-22: 10 mg via ORAL
  Filled 2024-01-22: qty 1

## 2024-01-22 MED ORDER — SODIUM CHLORIDE 0.9 % IV SOLN
INTRAVENOUS | Status: DC
  Filled 2024-01-22: qty 250

## 2024-01-22 NOTE — Progress Notes (Signed)
 Hematology/Oncology Progress note [phone] [phone]      CHIEF COMPLAINTS/PURPOSE OF CONSULTATION:  Stage IV pancreatic carcinoma with liver metastasis.  ASSESSMENT & PLAN:   Pancreatic cancer metastasized to liver [facility]) Imaging findings and different lesion biopsy results were reviewed and discussed with patient. 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  Other differential diagnosis include GI tract adenocarcinoma, cholangiocarcinoma etc. CEA is elevated.  His CA 19-9 is normal. CT and MRI imaging is did not show any GI primary.I recommend a PET scan for further evaluation- patient rescheduled appt initially and later on PET scan was not done due to admission.  Labs are reviewed and discussed with patient. Proceed with dose reduced Gemcitabine  900mg /m2 /Abraxane  100mg /m2   Recommend patient continue follow-up with palliative care service.    Atrial fibrillation with RVR (HCC) Atrial flutter: with RVR. S/p TEE & cardioverison on 12/28/23  Recommend patient to follow-up with cardiology.  Continue metoprolol  and Eliquis   Bilateral lower extremity edema Recent echocardiogram showed normal LVEF. Anasarca versus lymphedema.  Chemotherapy-induced neuropathy (HCC) Recommend gabapentin  100mg  TID PRN.    Encounter for antineoplastic chemotherapy Chemotherapy plan as listed above    No orders of the defined types were placed in this encounter.  Follow-up 2 weeks All questions were answered. The patient knows to call the clinic with any problems, questions or concerns.  [name], MD, PhD [facility] Hematology Oncology 01/22/2024    HISTORY OF PRESENTING ILLNESS:  Cianna Ratti 63 y.o. female presents to establish care for Stage IV pancreatic cancer I have reviewed her chart and materials related to her cancer extensively and collaborated history with the patient. Summary of oncologic history is as  follows: Oncology History  Pancreatic cancer metastasized to liver (HCC)  08/21/2023 Imaging   CT abdomen pelvis with contrast showed 1. Masslike thickening involving the proximal and mid tail of pancreas with relative hypoenhancement measures approximately 3.9 x 5.5 cm. In the absence of signs/symptoms of acute pancreatitis underlying neoplastic process cannot be excluded. Further evaluation with nonemergent contrast enhanced MRI of the abdomen is advised. Note: Given patient's body habitus and the motion artifact observed on the current exam an MRI obtained at this time is likely to be severely limited and likely nondiagnostic. MRI should be obtained only once the patient is clinically stable, and is able to remain motionless and breath hold. 2. Patchy ground-glass and airspace densities identified within both lower lobes. These are new compared with the previous exam and are concerning for underlying inflammatory/infectious process. 3. Sigmoid diverticulosis without signs of acute diverticulitis. 4. Marked diastasis recti with ventral herniation of the large and small bowel loops. 5. Periumbilical hernia is containing fat only.   08/24/2023 Imaging   MRI abdomen wo contrast  1. Moderately suboptimal unenhanced exam. 2. Heterogeneous masslike thickening of the pancreatic body/tail again seen. The lesion remains indeterminate on this exam. Differential diagnosis includes pancreatic tumor, autoimmune pancreatitis, mass forming chronic pancreatitis, etc. Please see above for follow-up recommendations. 3. Mild diffuse hepatic steatosis. There are indeterminate areas in the liver, which can also be better evaluated on the contrast-enhanced MRI abdomen. 4. Other observations, as described above.   11/05/2023 Initial Diagnosis   Pancreatic cancer metastasized to liver  Patient was hospitalized from 11/05/2023 - 11/10/2023 She presented to emergency room due to nausea, vomiting and  abdominal pain. CT scan showed multiple hypodense hepatic lesions increasing number and size from prior imaging.  Pancreatic tail mass, hypodense lesion  in the anterior spleen.  11/09/2023 ultrasound-guided biopsy was obtained. Pathology showed moderately to poorly differentiated adenocarcinoma with extensive areas of tumor necrosis. The adenocarcinoma is positive for cytokeratin 7 and CDX2 (focal).     Cytokeratin 20, TTF-1, and GATA3 are negative. The pattern of immunoreactivity is not specific and the differential diagnosis includes metastatic adenocarcinoma from the pancreaticobiliary tract and upper GI. Intrahepatic       cholangiocarcinoma is also a diagnostic possibility. Given the radiographic  findings a pancreaticobiliary primary is favored and correlation with clinical impression is required.    11/17/2023 Cancer Staging   Staging form: Exocrine Pancreas, AJCC 8th Edition - Clinical stage from 11/17/2023: Stage IV (cT3, cNX, pM1) - Signed by [name], MD on 11/17/2023 Stage prefix: Initial diagnosis    Imaging   CT abdomen pelvis w contrast  1. Multiple hypodense hepatic lesions, increased in number and size from prior imaging, highly suspicious for metastatic disease. 2. Hypodense lesion in the pancreatic tail measures 7.2 x 3.9 cm, increased in size from prior. The increase in size favors pancreatic neoplasm, with additional etiologies less likely. 3. New hypodense lesion in the anterior spleen, 13 mm, suspicious for metastatic disease. 4. Laxity of the anterior abdominal wall with midline ventral abdominal wall hernia containing small and to a lesser extent large bowel. No bowel obstruction or inflammation. 5. Colonic diverticulosis without diverticulitis.   Aortic Atherosclerosis (ICD10-I70.0)    11/27/2023 -  Chemotherapy   Patient is on Treatment Plan : PANCREATIC Abraxane  D1,8,15 + Gemcitabine  D1,8,15 q28d      Patient would like to get a medi port.  She presents for  evaluation prior to chemo.  Recent hospitalization due to Atrial flutter: with RVR. S/p TEE & cardioverison on 12/28/23, severe sepsis, gastroenteritis. Today patient denies any nausea vomiting diarrhea.  No fever or chills.  She feels better chronic bilateral lower extremity edema.   She walks around with her walker. She feels her mobility has improved  MEDICAL HISTORY:  Past Medical History:  Diagnosis Date   Anemia    Asthma    Back pain    Cataract    Diabetes mellitus without complication (HCC)    GERD (gastroesophageal reflux disease)    History of echocardiogram    Hypertension    Morbid obesity with BMI of 60.0-69.9, adult (HCC)    Restless leg syndrome     SURGICAL HISTORY: Past Surgical History:  Procedure Laterality Date   ABDOMINAL HYSTERECTOMY     CARDIOVERSION N/A 12/28/2023   Procedure: CARDIOVERSION;  Surgeon: Constancia Delton, MD;  Location: [facility] ORS;  Service: Cardiovascular;  Laterality: N/A;   CATARACT EXTRACTION W/PHACO Left 04/13/2020   Procedure: CATARACT EXTRACTION PHACO AND INTRAOCULAR LENS PLACEMENT (IOC);  Surgeon: Ola Berger, MD;  Location: [facility] ORS;  Service: Ophthalmology;  Laterality: Left;  US  00:36.0 CDE 5.95 Fluid Pack lot # [id number]   HYSTEROSCOPY WITH D & C N/A 07/14/2017   Procedure: DILATATION AND CURETTAGE /HYSTEROSCOPY;  Surgeon: Alben Alma, MD;  Location: [facility] ORS;  Service: Gynecology;  Laterality: N/A;   IR IMAGING GUIDED PORT INSERTION  12/17/2023   POLYPECTOMY  2015   TEE WITHOUT CARDIOVERSION N/A 12/28/2023   Procedure: ECHOCARDIOGRAM, TRANSESOPHAGEAL;  Surgeon: Constancia Delton, MD;  Location: [facility] ORS;  Service: Cardiovascular;  Laterality: N/A;    SOCIAL HISTORY: Social History   Socioeconomic History   Marital status: Single    Spouse name: Not on file   Number of children: Not on file  Years of education: Not on file   Highest education level: Not on file  Occupational History   Not on file  Tobacco Use    Smoking status: Never   Smokeless tobacco: Never  Vaping Use   Vaping status: Never Used  Substance and Sexual Activity   Alcohol use: No   Drug use: No   Sexual activity: Never    Birth control/protection: None  Other Topics Concern   Not on file  Social History Narrative   Not on file   Social Drivers of Health   Financial Resource Strain: Low Risk  (09/26/2022)   Received from [facility], [facility]   Overall Financial Resource Strain (CARDIA)    Difficulty of Paying Living Expenses: Not hard at all  Food Insecurity: No Food Insecurity (12/25/2023)   Hunger Vital Sign    Worried About Running Out of Food in the Last Year: Never true    Ran Out of Food in the Last Year: Never true  Transportation Needs: No Transportation Needs (12/25/2023)   PRAPARE - Administrator, Civil Service (Medical): No    Lack of Transportation (Non-Medical): No  Recent Concern: Transportation Needs - Unmet Transportation Needs (09/28/2023)   PRAPARE - Administrator, Civil Service (Medical): Yes    Lack of Transportation (Non-Medical): Yes  Physical Activity: Not on file  Stress: Not on file  Social Connections: Socially Isolated (12/25/2023)   Social Connection and Isolation Panel [NHANES]    Frequency of Communication with Friends and Family: More than three times a week    Frequency of Social Gatherings with Friends and Family: More than three times a week    Attends Religious Services: Never    Database administrator or Organizations: No    Attends Banker Meetings: Never    Marital Status: Never married  Intimate Partner Violence: Not At Risk (12/25/2023)   Humiliation, Afraid, Rape, and Kick questionnaire    Fear of Current or Ex-Partner: No    Emotionally Abused: No    Physically Abused: No    Sexually Abused: No    FAMILY HISTORY: Family History  Problem Relation Age of Onset   Breast cancer Mother 66   Diabetes  Mother    Hypertension Mother    Ovarian cancer Paternal Aunt        ?   Diabetes Father    Hypertension Father     ALLERGIES:  is allergic to peanut-containing drug products.  MEDICATIONS:  Current Outpatient Medications  Medication Sig Dispense Refill   acetaminophen  (TYLENOL  8 HOUR ARTHRITIS PAIN) 650 MG CR tablet Take 3 tablets (1,950 mg total) by mouth every 8 (eight) hours as needed for pain. Do not exceed 4000 mg total in a day.  Home med.     apixaban  (ELIQUIS ) 5 MG TABS tablet Take 1 tablet (5 mg total) by mouth 2 (two) times daily. 60 tablet 0   azelastine (ASTELIN) 0.1 % nasal spray Place 1 spray into both nostrils 2 (two) times daily.     diphenoxylate -atropine  (LOMOTIL ) 2.5-0.025 MG tablet Take 2 tablets by mouth every 6 (six) hours as needed (for diarrhea unrelieved by imodium). 90 tablet 0   EPINEPHrine  0.3 mg/0.3 mL IJ SOAJ injection Inject 0.3 mg into the muscle as needed for anaphylaxis. 1 each 0   fluticasone  (FLONASE ) 50 MCG/ACT nasal spray Place 1 spray into both nostrils daily. 9.9 mL 0   fluticasone  furoate-vilanterol (BREO  ELLIPTA) 100-25 MCG/ACT AEPB Inhale 1 puff into the lungs daily. 28 each 1   furosemide  (LASIX ) 40 MG tablet Take 40 mg by mouth every other day.     gabapentin  (NEURONTIN ) 100 MG capsule Take 1 capsule (100 mg total) by mouth 3 (three) times daily. 30 capsule 0   ipratropium-albuterol  (DUONEB) 0.5-2.5 (3) MG/3ML SOLN Take 3 mLs by nebulization every 4 (four) hours as needed (wheezing / shortness of breath). 360 mL 1   levocetirizine (XYZAL) 5 MG tablet Take 5 mg by mouth daily.     lidocaine -prilocaine  (EMLA ) cream Apply 1 Application topically as needed. Apply to port and cover with saran wrap 1-2 hours prior to port access 30 g 1   magic mouthwash (lidocaine , diphenhydrAMINE , alum & mag hydroxide) suspension Swish and swallow 5 mLs 4 (four) times daily as needed for mouth pain. 360 mL 1   metFORMIN  (GLUCOPHAGE ) 1000 MG tablet Take 1,000 mg by  mouth 2 (two) times daily.     metoprolol  tartrate (LOPRESSOR ) 25 MG tablet Take 1 tablet (25 mg total) by mouth 2 (two) times daily. 60 tablet 0   morphine  (MS CONTIN ) 15 MG 12 hr tablet Take 1 tablet (15 mg total) by mouth every 12 (twelve) hours. 30 tablet 0   naloxone  (NARCAN ) nasal spray 4 mg/0.1 mL SPRAY 1 SPRAY INTO ONE NOSTRIL AS DIRECTED FOR OPIOID OVERDOSE (TURN PERSON ON SIDE AFTER DOSE. IF NO RESPONSE IN 2-3 MINUTES OR PERSON RESPONDS BUT RELAPSES, REPEAT USING A NEW SPRAY DEVICE AND SPRAY INTO THE OTHER NOSTRIL. CALL 911 AFTER USE.) * EMERGENCY USE ONLY * 1 each 0   ondansetron  (ZOFRAN -ODT) 4 MG disintegrating tablet Take 1 tablet (4 mg total) by mouth every 8 (eight) hours as needed for nausea or vomiting. 45 tablet 0   oxyCODONE  (OXY IR/ROXICODONE ) 5 MG immediate release tablet Take 1 tablet (5 mg total) by mouth every 4 (four) hours as needed for moderate pain (pain score 4-6) or severe pain (pain score 7-10). 90 tablet 0   potassium chloride  SA (KLOR-CON  M) 20 MEQ tablet Take 1 tablet (20 mEq total) by mouth daily. 3 tablet 0   prochlorperazine  (COMPAZINE ) 10 MG tablet Take 1 tablet (10 mg total) by mouth every 6 (six) hours as needed for nausea or vomiting. 30 tablet 1   rOPINIRole  (REQUIP ) 2 MG tablet Take 2 mg by mouth in the morning and at bedtime.      Semaglutide, 1 MG/DOSE, 4 MG/3ML SOPN Inject 1 mg into the skin once a week.     traZODone  (DESYREL ) 50 MG tablet Take 0.5-1 tablets (25-50 mg total) by mouth at bedtime as needed for sleep. 30 tablet 2   VENTOLIN  HFA 108 (90 Base) MCG/ACT inhaler Inhale 2 puffs into the lungs every 4 (four) hours as needed for wheezing or shortness of breath. 18 g 1   No current facility-administered medications for this visit.    Review of Systems  Constitutional:  Positive for fatigue. Negative for appetite change, chills and fever.  HENT:   Negative for hearing loss and voice change.   Eyes:  Negative for eye problems.  Respiratory:   Negative for chest tightness, cough and shortness of breath.   Cardiovascular:  Positive for leg swelling. Negative for chest pain.  Gastrointestinal:  Positive for nausea. Negative for abdominal distention, abdominal pain and blood in stool.  Endocrine: Negative for hot flashes.  Genitourinary:  Negative for difficulty urinating and frequency.   Musculoskeletal:  Negative for  arthralgias.  Skin:  Negative for itching and rash.  Neurological:  Negative for extremity weakness.  Hematological:  Negative for adenopathy.  Psychiatric/Behavioral:  Negative for confusion.      PHYSICAL EXAMINATION: ECOG PERFORMANCE STATUS: 1 - Symptomatic but completely ambulatory  Vitals:   01/22/24 0909  BP: (!) 156/83  Pulse: 65  Resp: 18  Temp: (!) 96.4 F (35.8 C)  SpO2: 98%   Filed Weights   01/22/24 0907  Weight: 289 lb (131.1 kg)    Physical Exam Constitutional:      General: She is not in acute distress.    Appearance: She is obese. She is not diaphoretic.  HENT:     Head: Normocephalic and atraumatic.  Eyes:     General: No scleral icterus. Cardiovascular:     Rate and Rhythm: Normal rate and regular rhythm.  Pulmonary:     Effort: Pulmonary effort is normal. No respiratory distress.     Breath sounds: No wheezing or rales.     Comments: Decreased breath sound bilaterally Abdominal:     General: Bowel sounds are normal. There is no distension.     Palpations: Abdomen is soft.  Musculoskeletal:        General: Swelling present. Normal range of motion.     Cervical back: Normal range of motion and neck supple.     Right lower leg: Edema present.     Left lower leg: Edema present.  Skin:    General: Skin is warm and dry.     Findings: No erythema.  Neurological:     Mental Status: She is alert and oriented to person, place, and time. Mental status is at baseline.     Motor: No abnormal muscle tone.  Psychiatric:        Mood and Affect: Mood and affect normal.       LABORATORY DATA:  I have reviewed the data as listed    Latest Ref Rng & Units 01/22/2024    8:37 AM 01/08/2024    8:39 AM 12/30/2023    4:25 AM  CBC  WBC 4.0 - 10.5 K/uL 6.8  7.3  19.4   Hemoglobin 12.0 - 15.0 g/dL 9.9  8.9  8.4   Hematocrit 36.0 - 46.0 % 31.4  27.4  25.0   Platelets 150 - 400 K/uL 236  240  189       Latest Ref Rng & Units 01/22/2024    8:37 AM 01/08/2024    8:39 AM 12/30/2023    4:25 AM  CMP  Glucose 70 - 99 mg/dL 130  865  784   BUN 8 - 23 mg/dL 9  8  12    Creatinine 0.44 - 1.00 mg/dL 6.96  2.95  2.84   Sodium 135 - 145 mmol/L 138  139  136   Potassium 3.5 - 5.1 mmol/L 3.5  3.9  3.6   Chloride 98 - 111 mmol/L 103  105  107   CO2 22 - 32 mmol/L 27  25  22    Calcium  8.9 - 10.3 mg/dL 8.2  8.2  7.9   Total Protein 6.5 - 8.1 g/dL 7.1  6.5    Total Bilirubin 0.0 - 1.2 mg/dL 0.9  0.6    Alkaline Phos 38 - 126 U/L 101  123    AST 15 - 41 U/L 14  21    ALT 0 - 44 U/L 10  15       RADIOGRAPHIC STUDIES: I have personally reviewed  the radiological images as listed and agreed with the findings in the report. DG HIPS BILAT WITH PELVIS 3-4 VIEWS Result Date: 12/29/2023 CLINICAL DATA:  Bilateral hip pain. EXAM: DG HIP (WITH OR WITHOUT PELVIS) 3-4V BILAT COMPARISON:  None Available. FINDINGS: There is no evidence of hip fracture or dislocation. Severe narrowing and osteophyte formation is seen involving both hips. 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 No acute abnormality seen. Electronically Signed   By: Rosalene Colon M.D.   On: 12/29/2023 17:29   ECHO TEE Result Date: 12/28/2023    TRANSESOPHOGEAL ECHO REPORT   Patient Name:   Oreta Landfair Date of Exam: 12/28/2023 Medical Rec #:  [id number]          Height:       62.0 in Accession #:    [id number]         Weight:       248.2 lb Date of Birth:  29-Aug-1960          BSA:          2.096 m Patient Age:    63 years           BP:           83/54 mmHg Patient Gender: F                  HR:           85  bpm. Exam Location:  [facility] Procedure: Transesophageal Echo, Color Doppler and Cardiac Doppler (Both            Spectral and Color Flow Doppler were utilized during procedure). Indications:     Atrial Fibrillation I48.91  History:         Patient has prior history of Echocardiogram examinations, most                  recent 12/26/2023. Risk Factors:Diabetes and Hypertension. GERD.  Sonographer:     Broadus Canes Referring Phys:  [id number] Eugene Hertz BERGE Diagnosing Phys: Constancia Delton MD PROCEDURE: The transesophogeal probe was passed without difficulty through the esophogus of the patient. Sedation performed by different physician. The patient developed no complications during the procedure.  IMPRESSIONS  1. Left ventricular ejection fraction, by estimation, is 55 to 60%. The left ventricle has normal function.  2. Right ventricular systolic function is normal. The right ventricular size is normal.  3. No left atrial/left atrial appendage thrombus was detected.  4. The mitral valve is normal in structure. Trivial mitral valve regurgitation.  5. Tricuspid valve regurgitation is mild to moderate.  6. The aortic valve is tricuspid. Aortic valve regurgitation is not visualized. FINDINGS  Left Ventricle: Left ventricular ejection fraction, by estimation, is 55 to 60%. The left ventricle has normal function. The left ventricular internal cavity size was normal in size. Right Ventricle: The right ventricular size is normal. No increase in right ventricular wall thickness. Right ventricular systolic function is normal. Left Atrium: Left atrial size was normal in size. No left atrial/left atrial appendage thrombus was detected. Right Atrium: Right atrial size was normal in size. Pericardium: There is no evidence of pericardial effusion. Mitral Valve: The mitral valve is normal in structure. Trivial mitral valve regurgitation. Tricuspid Valve: The tricuspid valve is normal in structure. Tricuspid valve regurgitation is  mild to moderate. Aortic Valve: The aortic valve is tricuspid. Aortic valve regurgitation is not visualized. Pulmonic Valve: The pulmonic valve was grossly normal. Pulmonic valve regurgitation is trivial.  Aorta: The aortic root is normal in size and structure. IAS/Shunts: No atrial level shunt detected by color flow Doppler. Constancia Delton MD Electronically signed by Constancia Delton MD Signature Date/Time: 12/28/2023/4:39:22 PM    Final    ECHOCARDIOGRAM COMPLETE Result Date: 12/26/2023    ECHOCARDIOGRAM REPORT   Patient Name:   Raianna Kroeze Date of Exam: 12/26/2023 Medical Rec #:  [id number]          Height:       62.0 in Accession #:    [id number]         Weight:       248.2 lb Date of Birth:  November 25, 1960          BSA:          2.096 m Patient Age:    63 years           BP:           91/66 mmHg Patient Gender: F                  HR:           111 bpm. Exam Location:  [facility] Procedure: 2D Echo (Both Spectral and Color Flow Doppler were utilized during            procedure). Indications:     CHF I50.31  History:         Patient has prior history of Echocardiogram examinations, most                  recent 02/12/2023.  Sonographer:     Clenton Czech RDCS, FASE Referring Phys:  [id number] Avi Body Diagnosing Phys: Belva Boyden MD  Sonographer Comments: Technically challenging study due to limited acoustic windows, suboptimal apical window and patient is obese. Image acquisition challenging due to patient body habitus and Image acquisition challenging due to uncooperative patient. The patient declined Definity  IV ultrasound imaging agent. IMPRESSIONS  1. Left ventricular ejection fraction, by estimation, is 55 to 60%. Left ventricular ejection fraction by PLAX is 51 %. The left ventricle has normal function. The left ventricle has no regional wall motion abnormalities. There is moderate left ventricular hypertrophy. Left ventricular diastolic parameters are indeterminate.  2. Right ventricular systolic  function is mildly reduced. The right ventricular size is normal. Tricuspid regurgitation signal is inadequate for assessing PA pressure.  3. The mitral valve is normal in structure. No evidence of mitral valve regurgitation. No evidence of mitral stenosis.  4. The aortic valve is normal in structure. Aortic valve regurgitation is not visualized. No aortic stenosis is present.  5. The inferior vena cava is normal in size with greater than 50% respiratory variability, suggesting right atrial pressure of 3 mmHg. FINDINGS  Left Ventricle: Left ventricular ejection fraction, by estimation, is 55 to 60%. Left ventricular ejection fraction by PLAX is 51 %. The left ventricle has normal function. The left ventricle has no regional wall motion abnormalities. Strain was performed and the global longitudinal strain is indeterminate. The left ventricular internal cavity size was normal in size. There is moderate left ventricular hypertrophy. Left ventricular diastolic parameters are indeterminate. Right Ventricle: The right ventricular size is normal. No increase in right ventricular wall thickness. Right ventricular systolic function is mildly reduced. Tricuspid regurgitation signal is inadequate for assessing PA pressure. Left Atrium: Left atrial size was normal in size. Right Atrium: Right atrial size was normal in size. Pericardium: There is no evidence of pericardial effusion. Mitral Valve: The mitral  valve is normal in structure. No evidence of mitral valve regurgitation. No evidence of mitral valve stenosis. Tricuspid Valve: The tricuspid valve is normal in structure. Tricuspid valve regurgitation is not demonstrated. No evidence of tricuspid stenosis. Aortic Valve: The aortic valve is normal in structure. Aortic valve regurgitation is not visualized. No aortic stenosis is present. Pulmonic Valve: The pulmonic valve was normal in structure. Pulmonic valve regurgitation is not visualized. No evidence of pulmonic  stenosis. Aorta: The aortic root is normal in size and structure. Venous: The inferior vena cava is normal in size with greater than 50% respiratory variability, suggesting right atrial pressure of 3 mmHg. IAS/Shunts: No atrial level shunt detected by color flow Doppler. Additional Comments: 3D was performed not requiring image post processing on an independent workstation and was indeterminate.  LEFT VENTRICLE PLAX 2D LV EF:         Left ventricular ejection fraction by PLAX is 51 %. LVIDd:         4.60 cm LVIDs:         3.40 cm LV PW:         1.40 cm LV IVS:        1.40 cm LVOT diam:     1.90 cm LVOT Area:     2.84 cm  LEFT ATRIUM         Index LA diam:    3.30 cm 1.57 cm/m                        PULMONIC VALVE AORTA                 PV Vmax:       0.98 m/s Ao Root diam: 3.90 cm PV Peak grad:  3.8 mmHg Ao Asc diam:  3.00 cm  MITRAL VALVE               TRICUSPID VALVE MV Area (PHT): 4.46 cm    TR Peak grad:   13.0 mmHg MV Decel Time: 170 msec    TR Vmax:        180.00 cm/s MV E velocity: 98.50 cm/s MV A velocity: 68.70 cm/s  SHUNTS MV E/A ratio:  1.43        Systemic Diam: 1.90 cm Belva Boyden MD Electronically signed by Belva Boyden MD Signature Date/Time: 12/26/2023/4:06:59 PM    Final    US  Venous Img Lower Bilateral (DVT) Result Date: 12/25/2023 CLINICAL DATA:  Lower extremity edema for 2 weeks EXAM: Bilateral Lower Extremity Venous Doppler Ultrasound TECHNIQUE: Gray-scale sonography with compression, as well as color and duplex ultrasound, were performed to evaluate the deep venous system(s) from the level of the common femoral vein through the popliteal and proximal calf veins. COMPARISON:  None available FINDINGS: VENOUS Normal compressibility of the common femoral, superficial femoral, and popliteal veins, as well as the visualized calf veins. Visualized portions of profunda femoral vein and great saphenous vein unremarkable. No filling defects to suggest DVT on grayscale or color Doppler imaging.  Doppler waveforms show normal direction of venous flow, normal respiratory plasticity and response to augmentation. OTHER None. Limitations: none IMPRESSION: No lower extremity DVT. Electronically Signed   By: Elester Grim M.D.   On: 12/25/2023 17:29   CT CHEST WO CONTRAST Result Date: 12/25/2023 CLINICAL DATA:  Bilateral lower extremity edema. History of pancreatic cancer. EXAM: CT CHEST WITHOUT CONTRAST TECHNIQUE: Multidetector CT imaging of the chest was performed following the standard protocol without IV contrast.  RADIATION DOSE REDUCTION: This exam was performed according to the departmental dose-optimization program which includes automated exposure control, adjustment of the mA and/or kV according to patient size and/or use of iterative reconstruction technique. COMPARISON:  August 21, 2023. FINDINGS: Cardiovascular: Atherosclerosis of thoracic aorta without aneurysm formation. Mild cardiomegaly. No pericardial effusion. Right internal jugular Port-A-Cath is noted. Mediastinum/Nodes: No enlarged mediastinal or axillary lymph nodes. Thyroid  gland, trachea, and esophagus demonstrate no significant findings. Lungs/Pleura: No pneumothorax or pleural effusion is noted. Right lung is clear. 3.5 x 2.6 cm cavitating lesion is noted in left lower lobe which may represent cavitary pneumonia or possibly metastatic disease. Upper Abdomen: Interval development of multiple rounded low densities throughout the left and right hepatic lobes consistent with metastatic disease. Pancreatic mass is again noted consistent with history of pancreatic malignancy. Musculoskeletal: No chest wall mass or suspicious bone lesions identified. IMPRESSION: Interval development of multiple rounded hepatic low densities consistent with metastatic disease. Pancreatic mass is again noted consistent with history of pancreatic malignancy. 3.5 x 2.6 cm cavitating lesion is noted in left lower lobe which may represent cavitary pneumonia or  possibly metastatic disease. Electronically Signed   By: Rosalene Colon M.D.   On: 12/25/2023 13:53   CT ABDOMEN PELVIS W CONTRAST Result Date: 12/25/2023 CLINICAL DATA:  Sepsis. * Tracking Code: BO * EXAM: CT ABDOMEN AND PELVIS WITH CONTRAST TECHNIQUE: Multidetector CT imaging of the abdomen and pelvis was performed using the standard protocol following bolus administration of intravenous contrast. RADIATION DOSE REDUCTION: This exam was performed according to the departmental dose-optimization program which includes automated exposure control, adjustment of the mA and/or kV according to patient size and/or use of iterative reconstruction technique. CONTRAST:  OMNIPAQUE  IOHEXOL  300 MG/ML  SOLN COMPARISON:  CT scan abdomen and pelvis from 11/05/2023. FINDINGS: Lower chest: Since the prior study, there is a new thick-walled cavitary lesion in the left lung lower lobe measuring 2.4 x 2.8 cm. Visualized bilateral lung bases are otherwise clear. No pleural effusion. Normal heart size. No pericardial effusion. Hepatobiliary: The liver is normal in size. Non-cirrhotic configuration. Redemonstration of multiple (more than 25), hypoattenuating liver lesions with dominant lesion in the right hepatic lobe measuring up to 2.2 x 2.3 cm, which previously measured up to 1.5 x 1.8 cm. Findings are compatible with worsening metastases. No intrahepatic or extrahepatic bile duct dilation. Gallbladder is surgically absent. Pancreas: Redemonstration of ill-defined heterogeneous predominantly hypoattenuating mass centered within the pancreatic tail and distal body measuring approximately 4.2 x 7.4 cm, grossly unchanged since the prior study and highly concerning for pancreatic neoplasm. Rest of the pancreas is unremarkable. Main pancreatic duct is not dilated. No peripancreatic fat stranding. 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 Spleen: Normal size spleen.  Redemonstration of a hypoattenuating 2.2 x 2.6 cm lesion along the superolateral aspect, which has increased in size since the prior study. There is a probable new sub 5 mm subcapsular focus in the posterosuperior aspect, which is concerning for new metastases. Adrenals/Urinary Tract: Adrenal glands are unremarkable. No suspicious renal mass. No hydronephrosis. No renal or ureteric calculi. Unremarkable urinary bladder. Stomach/Bowel: No disproportionate dilation of the small or large bowel loops. No evidence of abnormal bowel wall thickening or inflammatory changes. The appendix is unremarkable. There are multiple diverticula mainly in the sigmoid colon, without imaging signs of diverticulitis. Vascular/Lymphatic: No ascites or pneumoperitoneum. No abdominal or pelvic lymphadenopathy, by size criteria. No aneurysmal dilation of  the major abdominal arteries. There are mild peripheral atherosclerotic vascular calcifications of the aorta and its major branches. Reproductive: The uterus is surgically absent. No large adnexal mass. Other: Note is again made of thinned/lax anterior abdominal wall. There is a small fat containing right paramedian periumbilical hernia. The soft tissues and abdominal wall are otherwise unremarkable. Musculoskeletal: No suspicious osseous lesions. There are moderate multilevel degenerative changes in the visualized spine. Bilateral marked hip joint arthritis noted IMPRESSION: 1. Redemonstration of ill-defined heterogeneous hypoattenuating mass centered within the pancreatic tail and distal body, highly concerning for pancreatic neoplasm. 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 2. Redemonstration of multiple hypoattenuating liver lesions, which have increased in size since the prior study, compatible with worsening metastases. 3. There is a new thick-walled cavitary lesion in the left lung lower lobe measuring 2.4 x 2.8 cm.  This is indeterminate and differential diagnosis includes metastases versus infectious/inflammatory etiology. Consider dedicated chest imaging. 4. Multiple other nonacute observations, as described above. Aortic Atherosclerosis (ICD10-I70.0). Electronically Signed   By: Beula Brunswick M.D.   On: 12/25/2023 11:37   DG Chest Port 1 View Result Date: 12/25/2023 CLINICAL DATA:  Suspected sepsis EXAM: PORTABLE CHEST 1 VIEW COMPARISON:  09/28/2023 FINDINGS: Numerous leads and wires project over the chest. Patient is rotated to the left. Mildly degraded exam due to AP portable technique and patient body habitus. Mild cardiomegaly. No right-sided pleural effusion. Left costophrenic angle is obscured by overlying breast tissue. IMPRESSION: Degraded exam secondary to AP portable technique and patient body habitus. Especially the inferior left hemithorax is poorly evaluated secondary to overlying soft tissues. Given this limitation, cardiomegaly and low lung volumes without acute disease. If there is a clinical concern of left lower lobe pneumonia, recommend PA and lateral radiographs. Electronically Signed   By: Lore Rode M.D.   On: 12/25/2023 10:51

## 2024-01-22 NOTE — Assessment & Plan Note (Signed)
 Recent echocardiogram showed normal LVEF. Anasarca versus lymphedema.

## 2024-01-22 NOTE — Progress Notes (Signed)
 Nutrition Follow-up:  Contacted by inpatient RD team for follow-up  Patient with pancreatic cancer metastasized to liver.  Patient resuming abraxane  and gemcitabine .  Noted recent hospitalization due to weakness, sepsis  Met with patient during infusion.  Reports that she is willing to speak with RD today.  Says that her appetite is really good since she has been off chemotherapy.  Drinks ensure shakes each morning.  Has aides that help her prepare meals.  Yesterday ate 2 Big Mac's with fries, spaghetti and garlic bread, 3 hot dogs, polish sausage and mashed potatoes.  Says that taste has come back. Reports that oranges taste good to her.  Struggled with taste alterations during treatment  Medications: reviewed  Labs: glucose 149, hgb 9.9  Anthropometrics:   Weight 289 lb today (fluid) 269 lb on 4/11   NUTRITION DIAGNOSIS: none at this time    INTERVENTION:  Encouraged food rich in protein Continue ensure shake for breakfast Discussed strategies to help with taste alterations    MONITORING, EVALUATION, GOAL: weight trends, intake   NEXT VISIT: Friday, June 27 during infusion  Jago Carton B. Zollie Hipp, CSO, LDN Registered Dietitian [phone]

## 2024-01-22 NOTE — Assessment & Plan Note (Signed)
 Chemotherapy plan as listed above

## 2024-01-22 NOTE — Assessment & Plan Note (Signed)
 Recommend gabapentin  100mg  TID PRN.

## 2024-01-22 NOTE — Assessment & Plan Note (Signed)
 Atrial flutter: with RVR. S/p TEE & cardioverison on 12/28/23  Recommend patient to follow-up with cardiology.  Continue metoprolol  and Eliquis 

## 2024-01-22 NOTE — Assessment & Plan Note (Signed)
 Imaging findings and different lesion biopsy results were reviewed and discussed with patient. 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  Other differential diagnosis include GI tract adenocarcinoma, cholangiocarcinoma etc. CEA is elevated.  His CA 19-9 is normal. CT and MRI imaging is did not show any GI primary.I recommend a PET scan for further evaluation- patient rescheduled appt initially and later on PET scan was not done due to admission.  Labs are reviewed and discussed with patient. Proceed with dose reduced Gemcitabine  900mg /m2 /Abraxane  100mg /m2   Recommend patient continue follow-up with palliative care service.

## 2024-01-22 NOTE — Patient Instructions (Signed)
 [facility] - A DEPT OF [facility]  Discharge Instructions: Thank you for choosing [facility] to provide your oncology and hematology care.  If you have a lab appointment with the Cancer Center, please go directly to the Cancer Center and check in at the registration area.  Wear comfortable clothing and clothing appropriate for easy access to any Portacath or PICC line.   We strive to give you quality time with your provider. You may need to reschedule your appointment if you arrive late (15 or more minutes).  Arriving late affects you and other patients whose appointments are after yours.  Also, if you miss three or more appointments without notifying the office, you may be dismissed from the clinic at the provider's discretion.      For prescription refill requests, have your pharmacy contact our office and allow 72 hours for refills to be completed.    Today you received the following chemotherapy and/or immunotherapy agents abraxane and gemzar   To help prevent nausea and vomiting after your treatment, we encourage you to take your nausea medication as directed.  BELOW ARE SYMPTOMS THAT SHOULD BE REPORTED IMMEDIATELY: *FEVER GREATER THAN 100.4 F (38 C) OR HIGHER *CHILLS OR SWEATING *NAUSEA AND VOMITING THAT IS NOT CONTROLLED WITH YOUR NAUSEA MEDICATION *UNUSUAL SHORTNESS OF BREATH *UNUSUAL BRUISING OR BLEEDING *URINARY PROBLEMS (pain or burning when urinating, or frequent urination) *BOWEL PROBLEMS (unusual diarrhea, constipation, pain near the anus) TENDERNESS IN MOUTH AND THROAT WITH OR WITHOUT PRESENCE OF ULCERS (sore throat, sores in mouth, or a toothache) UNUSUAL RASH, SWELLING OR PAIN  UNUSUAL VAGINAL DISCHARGE OR ITCHING   Items with * indicate a potential emergency and should be followed up as soon as possible or go to the Emergency Department if any problems should occur.  Please show the CHEMOTHERAPY ALERT CARD or  IMMUNOTHERAPY ALERT CARD at check-in to the Emergency Department and triage nurse.  Should you have questions after your visit or need to cancel or reschedule your appointment, please contact [facility] - A DEPT OF [facility]  [phone] and follow the prompts.  Office hours are 8:00 a.m. to 4:30 p.m. Monday - Friday. Please note that voicemails left after 4:00 p.m. may not be returned until the following business day.  We are closed weekends and major holidays. You have access to a nurse at all times for urgent questions. Please call the main number to the clinic [phone] and follow the prompts.  For any non-urgent questions, you may also contact your provider using MyChart. We now offer e-Visits for anyone 5 and older to request care online for non-urgent symptoms. For details visit [email or web address].   Also download the MyChart app! Go to the app store, search "MyChart", open the app, select [place], and log in with your MyChart username and password.

## 2024-01-23 LAB — CANCER ANTIGEN 19-9: CA 19-9: 2 U/mL (ref 0–35)

## 2024-01-23 LAB — CEA: CEA: 87.3 ng/mL — ABNORMAL HIGH (ref 0.0–4.7)

## 2024-01-26 ENCOUNTER — Ambulatory Visit: Attending: Cardiovascular Disease | Admitting: Cardiovascular Disease

## 2024-01-26 NOTE — Progress Notes (Deleted)
 Cardiology Office Note   Date:  01/26/2024   ID:  [name], DOB 04-22-1961, MRN [id number]  PCP:  Kristina Craver, FNP  Cardiologist:  Kristina Kirks, MD   No chief complaint on file.     History of Present Illness: Kristina Cruz is a 63 y.o. female who presents for ***    Past Medical History:  Diagnosis Date   Anemia    Asthma    Back pain    Cataract    Diabetes mellitus without complication (HCC)    GERD (gastroesophageal reflux disease)    History of echocardiogram    Hypertension    Morbid obesity with BMI of 60.0-69.9, adult (HCC)    Restless leg syndrome     Past Surgical History:  Procedure Laterality Date   ABDOMINAL HYSTERECTOMY     CARDIOVERSION N/A 12/28/2023   Procedure: CARDIOVERSION;  Surgeon: Kristina Delton, MD;  Location: [facility] ORS;  Service: Cardiovascular;  Laterality: N/A;   CATARACT EXTRACTION W/PHACO Left 04/13/2020   Procedure: CATARACT EXTRACTION PHACO AND INTRAOCULAR LENS PLACEMENT (IOC);  Surgeon: Kristina Berger, MD;  Location: [facility] ORS;  Service: Ophthalmology;  Laterality: Left;  US  00:36.0 CDE 5.95 Fluid Pack lot # [id number]   HYSTEROSCOPY WITH D & C N/A 07/14/2017   Procedure: DILATATION AND CURETTAGE /HYSTEROSCOPY;  Surgeon: Kristina Alma, MD;  Location: [facility] ORS;  Service: Gynecology;  Laterality: N/A;   IR IMAGING GUIDED PORT INSERTION  12/17/2023   POLYPECTOMY  2015   TEE WITHOUT CARDIOVERSION N/A 12/28/2023   Procedure: ECHOCARDIOGRAM, TRANSESOPHAGEAL;  Surgeon: Kristina Delton, MD;  Location: [facility] ORS;  Service: Cardiovascular;  Laterality: N/A;     Current Outpatient Medications  Medication Sig Dispense Refill   acetaminophen  (TYLENOL  8 HOUR ARTHRITIS PAIN) 650 MG CR tablet Take 3 tablets (1,950 mg total) by mouth every 8 (eight) hours as needed for pain. Do not exceed 4000 mg total in a day.  Home med.     apixaban  (ELIQUIS ) 5 MG TABS tablet Take 1 tablet (5 mg total) by mouth 2 (two) times daily. 60  tablet 0   azelastine (ASTELIN) 0.1 % nasal spray Place 1 spray into both nostrils 2 (two) times daily.     diphenoxylate -atropine  (LOMOTIL ) 2.5-0.025 MG tablet Take 2 tablets by mouth every 6 (six) hours as needed (for diarrhea unrelieved by imodium). 90 tablet 0   EPINEPHrine  0.3 mg/0.3 mL IJ SOAJ injection Inject 0.3 mg into the muscle as needed for anaphylaxis. 1 each 0   fluticasone  (FLONASE ) 50 MCG/ACT nasal spray Place 1 spray into both nostrils daily. 9.9 mL 0   fluticasone  furoate-vilanterol (BREO ELLIPTA ) 100-25 MCG/ACT AEPB Inhale 1 puff into the lungs daily. 28 each 1   furosemide  (LASIX ) 40 MG tablet Take 40 mg by mouth every other day.     gabapentin  (NEURONTIN ) 100 MG capsule Take 1 capsule (100 mg total) by mouth 3 (three) times daily. 30 capsule 0   ipratropium-albuterol  (DUONEB) 0.5-2.5 (3) MG/3ML SOLN Take 3 mLs by nebulization every 4 (four) hours as needed (wheezing / shortness of breath). 360 mL 1   levocetirizine (XYZAL) 5 MG tablet Take 5 mg by mouth daily.     lidocaine -prilocaine  (EMLA ) cream Apply 1 Application topically as needed. Apply to port and cover with saran wrap 1-2 hours prior to port access 30 g 1   magic mouthwash (lidocaine , diphenhydrAMINE , alum & mag hydroxide) suspension Swish and swallow 5 mLs 4 (four) times daily as needed for mouth  pain. 360 mL 1   metFORMIN  (GLUCOPHAGE ) 1000 MG tablet Take 1,000 mg by mouth 2 (two) times daily.     metoprolol  tartrate (LOPRESSOR ) 25 MG tablet Take 1 tablet (25 mg total) by mouth 2 (two) times daily. 60 tablet 0   morphine  (MS CONTIN ) 15 MG 12 hr tablet Take 1 tablet (15 mg total) by mouth every 12 (twelve) hours. 30 tablet 0   naloxone  (NARCAN ) nasal spray 4 mg/0.1 mL SPRAY 1 SPRAY INTO ONE NOSTRIL AS DIRECTED FOR OPIOID OVERDOSE (TURN PERSON ON SIDE AFTER DOSE. IF NO RESPONSE IN 2-3 MINUTES OR PERSON RESPONDS BUT RELAPSES, REPEAT USING A NEW SPRAY DEVICE AND SPRAY INTO THE OTHER NOSTRIL. CALL 911 AFTER USE.) * EMERGENCY  USE ONLY * 1 each 0   ondansetron  (ZOFRAN -ODT) 4 MG disintegrating tablet Take 1 tablet (4 mg total) by mouth every 8 (eight) hours as needed for nausea or vomiting. 45 tablet 0   oxyCODONE  (OXY IR/ROXICODONE ) 5 MG immediate release tablet Take 1 tablet (5 mg total) by mouth every 4 (four) hours as needed for moderate pain (pain score 4-6) or severe pain (pain score 7-10). 90 tablet 0   potassium chloride  SA (KLOR-CON  M) 20 MEQ tablet Take 1 tablet (20 mEq total) by mouth daily. 3 tablet 0   prochlorperazine  (COMPAZINE ) 10 MG tablet Take 1 tablet (10 mg total) by mouth every 6 (six) hours as needed for nausea or vomiting. 30 tablet 1   rOPINIRole  (REQUIP ) 2 MG tablet Take 2 mg by mouth in the morning and at bedtime.      Semaglutide, 1 MG/DOSE, 4 MG/3ML SOPN Inject 1 mg into the skin once a week.     traZODone  (DESYREL ) 50 MG tablet Take 0.5-1 tablets (25-50 mg total) by mouth at bedtime as needed for sleep. 30 tablet 2   VENTOLIN  HFA 108 (90 Base) MCG/ACT inhaler Inhale 2 puffs into the lungs every 4 (four) hours as needed for wheezing or shortness of breath. 18 g 1   No current facility-administered medications for this visit.    Allergies:   Peanut-containing drug products    Social History:  The patient  reports that she has never smoked. She has never used smokeless tobacco. She reports that she does not drink alcohol and does not use drugs.   Family History:  The patient's ***family history includes Breast cancer (age of onset: 45) in her mother; Diabetes in her father and mother; Hypertension in her father and mother; Ovarian cancer in her paternal aunt.    ROS:  Please see the history of present illness.   Otherwise, review of systems are positive for {NONE DEFAULTED:18576}.   All other systems are reviewed and negative.    PHYSICAL EXAM: VS:  LMP 01/04/2018  , BMI There is no height or weight on file to calculate BMI. GEN: Well nourished, well developed, in no acute distress   HEENT: normal  Neck: no JVD, carotid bruits, or masses Cardiac: ***RRR; no murmurs, rubs, or gallops,no edema  Respiratory:  clear to auscultation bilaterally, normal work of breathing GI: soft, nontender, nondistended, + BS MS: no deformity or atrophy  Skin: warm and dry, no rash Neuro:  Strength and sensation are intact Psych: euthymic mood, full affect   EKG:  EKG {ACTION; IS/IS UJW:[id number]} ordered today. The ekg ordered today demonstrates ***   Recent Labs: 12/25/2023: B Natriuretic Peptide 109.6 12/26/2023: Magnesium  2.1; TSH 1.900 01/22/2024: ALT 10; BUN 9; Creatinine 0.97; Hemoglobin 9.9; Platelet Count 236;  Potassium 3.5; Sodium 138    Lipid Panel No results found for: "CHOL", "TRIG", "HDL", "CHOLHDL", "VLDL", "LDLCALC", "LDLDIRECT"    Wt Readings from Last 3 Encounters:  01/22/24 289 lb (131.1 kg)  01/08/24 287 lb (130.2 kg)  12/25/23 248 lb 3.8 oz (112.6 kg)      Other studies Reviewed: Additional studies/ records that were reviewed today include: ***. Review of the above records demonstrates: ***      No data to display            ASSESSMENT AND PLAN:  1.  ***    Disposition:   FU with *** in {gen number 1-91:478295} {Days to years:10300}  Signed,  Kristina Kirks, MD  01/26/2024 2:55 PM    [facility]

## 2024-01-30 ENCOUNTER — Other Ambulatory Visit: Payer: Self-pay

## 2024-01-30 ENCOUNTER — Observation Stay
Admission: EM | Admit: 2024-01-30 | Discharge: 2024-01-31 | Disposition: A | Discharge status: Home / Self Care | Attending: Internal Medicine | Admitting: Internal Medicine

## 2024-01-30 ENCOUNTER — Emergency Department

## 2024-01-30 DIAGNOSIS — R600 Localized edema: Principal | ICD-10-CM | POA: Diagnosis present

## 2024-01-30 DIAGNOSIS — I480 Paroxysmal atrial fibrillation: Secondary | ICD-10-CM | POA: Diagnosis not present

## 2024-01-30 DIAGNOSIS — Z6841 Body Mass Index (BMI) 40.0 and over, adult: Secondary | ICD-10-CM | POA: Diagnosis not present

## 2024-01-30 DIAGNOSIS — Z91010 Allergy to peanuts: Secondary | ICD-10-CM | POA: Diagnosis not present

## 2024-01-30 DIAGNOSIS — C259 Malignant neoplasm of pancreas, unspecified: Secondary | ICD-10-CM | POA: Diagnosis not present

## 2024-01-30 DIAGNOSIS — Z79899 Other long term (current) drug therapy: Secondary | ICD-10-CM | POA: Diagnosis not present

## 2024-01-30 DIAGNOSIS — J449 Chronic obstructive pulmonary disease, unspecified: Secondary | ICD-10-CM | POA: Insufficient documentation

## 2024-01-30 DIAGNOSIS — E876 Hypokalemia: Secondary | ICD-10-CM

## 2024-01-30 DIAGNOSIS — Z7901 Long term (current) use of anticoagulants: Secondary | ICD-10-CM | POA: Insufficient documentation

## 2024-01-30 DIAGNOSIS — I110 Hypertensive heart disease with heart failure: Principal | ICD-10-CM | POA: Insufficient documentation

## 2024-01-30 DIAGNOSIS — R300 Dysuria: Secondary | ICD-10-CM | POA: Diagnosis not present

## 2024-01-30 DIAGNOSIS — R7989 Other specified abnormal findings of blood chemistry: Secondary | ICD-10-CM | POA: Diagnosis not present

## 2024-01-30 DIAGNOSIS — C787 Secondary malignant neoplasm of liver and intrahepatic bile duct: Secondary | ICD-10-CM | POA: Diagnosis not present

## 2024-01-30 DIAGNOSIS — J45909 Unspecified asthma, uncomplicated: Secondary | ICD-10-CM | POA: Insufficient documentation

## 2024-01-30 DIAGNOSIS — E119 Type 2 diabetes mellitus without complications: Secondary | ICD-10-CM | POA: Insufficient documentation

## 2024-01-30 DIAGNOSIS — D649 Anemia, unspecified: Secondary | ICD-10-CM | POA: Diagnosis not present

## 2024-01-30 DIAGNOSIS — I5033 Acute on chronic diastolic (congestive) heart failure: Secondary | ICD-10-CM | POA: Insufficient documentation

## 2024-01-30 DIAGNOSIS — I509 Heart failure, unspecified: Secondary | ICD-10-CM

## 2024-01-30 DIAGNOSIS — I214 Non-ST elevation (NSTEMI) myocardial infarction: Secondary | ICD-10-CM | POA: Insufficient documentation

## 2024-01-30 LAB — COMPREHENSIVE METABOLIC PANEL WITH GFR
ALT: 8 U/L (ref 0–44)
AST: 12 U/L — ABNORMAL LOW (ref 15–41)
Albumin: 2.5 g/dL — ABNORMAL LOW (ref 3.5–5.0)
Alkaline Phosphatase: 65 U/L (ref 38–126)
Anion gap: 10 (ref 5–15)
BUN: 7 mg/dL — ABNORMAL LOW (ref 8–23)
CO2: 27 mmol/L (ref 22–32)
Calcium: 7.9 mg/dL — ABNORMAL LOW (ref 8.9–10.3)
Chloride: 100 mmol/L (ref 98–111)
Creatinine, Ser: 0.92 mg/dL (ref 0.44–1.00)
GFR, Estimated: >60 mL/min (ref >60)
Glucose, Bld: 156 mg/dL — ABNORMAL HIGH (ref 70–99)
Potassium: 2.9 mmol/L — ABNORMAL LOW (ref 3.5–5.1)
Sodium: 137 mmol/L (ref 135–145)
Total Bilirubin: 0.7 mg/dL (ref 0.0–1.2)
Total Protein: 6.9 g/dL (ref 6.5–8.1)

## 2024-01-30 LAB — CBC WITH DIFFERENTIAL/PLATELET
Abs Immature Granulocytes: 0.02 10*3/uL (ref 0.00–0.07)
Basophils Absolute: 0 10*3/uL (ref 0.0–0.1)
Basophils Relative: 0 %
Eosinophils Absolute: 0.1 10*3/uL (ref 0.0–0.5)
Eosinophils Relative: 1 %
HCT: 26.5 % — ABNORMAL LOW (ref 36.0–46.0)
Hemoglobin: 8.8 g/dL — ABNORMAL LOW (ref 12.0–15.0)
Immature Granulocytes: 1 %
Lymphocytes Relative: 20 %
Lymphs Abs: 0.9 10*3/uL (ref 0.7–4.0)
MCH: 31.2 pg (ref 26.0–34.0)
MCHC: 33.2 g/dL (ref 30.0–36.0)
MCV: 94 fL (ref 80.0–100.0)
Monocytes Absolute: 0.2 10*3/uL (ref 0.1–1.0)
Monocytes Relative: 4 %
Neutro Abs: 3.2 10*3/uL (ref 1.7–7.7)
Neutrophils Relative %: 74 %
Platelets: 130 10*3/uL — ABNORMAL LOW (ref 150–400)
RBC: 2.82 MIL/uL — ABNORMAL LOW (ref 3.87–5.11)
RDW: 18.3 % — ABNORMAL HIGH (ref 11.5–15.5)
WBC: 4.3 10*3/uL (ref 4.0–10.5)
nRBC: 0.5 % — ABNORMAL HIGH (ref 0.0–0.2)

## 2024-01-30 LAB — BRAIN NATRIURETIC PEPTIDE: B Natriuretic Peptide: 70.5 pg/mL (ref 0.0–100.0)

## 2024-01-30 LAB — URINALYSIS, ROUTINE W REFLEX MICROSCOPIC
Bilirubin Urine: NEGATIVE
Glucose, UA: NEGATIVE mg/dL
Hgb urine dipstick: NEGATIVE
Ketones, ur: NEGATIVE mg/dL
Nitrite: NEGATIVE
Protein, ur: NEGATIVE mg/dL
Specific Gravity, Urine: 1.009 (ref 1.005–1.030)
WBC, UA: >50 WBC/hpf (ref 0–5)
pH: 6 (ref 5.0–8.0)

## 2024-01-30 LAB — TROPONIN I (HIGH SENSITIVITY)
Troponin I (High Sensitivity): 269 ng/L (ref <18)
Troponin I (High Sensitivity): 270 ng/L (ref <18)

## 2024-01-30 LAB — PHOSPHORUS: Phosphorus: 2.2 mg/dL — ABNORMAL LOW (ref 2.5–4.6)

## 2024-01-30 LAB — RETICULOCYTES
Immature Retic Fract: 6.9 % (ref 2.3–15.9)
RBC.: 2.86 MIL/uL — ABNORMAL LOW (ref 3.87–5.11)
Retic Count, Absolute: 11.6 10*3/uL — ABNORMAL LOW (ref 19.0–186.0)
Retic Ct Pct: 0.4 % (ref 0.4–3.1)

## 2024-01-30 LAB — IRON AND TIBC
Iron: 20 ug/dL — ABNORMAL LOW (ref 28–170)
Saturation Ratios: 14 % (ref 10.4–31.8)
TIBC: 139 ug/dL — ABNORMAL LOW (ref 250–450)
UIBC: 119 ug/dL

## 2024-01-30 LAB — MAGNESIUM: Magnesium: 1.8 mg/dL (ref 1.7–2.4)

## 2024-01-30 LAB — AMMONIA: Ammonia: 30 umol/L (ref 9–35)

## 2024-01-30 LAB — GLUCOSE, CAPILLARY
Glucose-Capillary: 155 mg/dL — ABNORMAL HIGH (ref 70–99)
Glucose-Capillary: 127 mg/dL — ABNORMAL HIGH (ref 70–99)

## 2024-01-30 LAB — FERRITIN: Ferritin: 737 ng/mL — ABNORMAL HIGH (ref 11–307)

## 2024-01-30 LAB — TSH: TSH: 1.763 u[IU]/mL (ref 0.350–4.500)

## 2024-01-30 MED ORDER — FUROSEMIDE 10 MG/ML IJ SOLN
60.0000 mg | Freq: Two times a day (BID) | INTRAMUSCULAR | Status: DC
Start: 2024-01-30 — End: 2024-01-31
  Administered 2024-01-31: 60 mg via INTRAVENOUS
  Filled 2024-01-30: qty 6
  Filled 2024-01-30: qty 8
  Filled 2024-01-30: qty 6

## 2024-01-30 MED ORDER — SODIUM CHLORIDE 0.9% FLUSH
3.0000 mL | Freq: Two times a day (BID) | INTRAVENOUS | Status: DC
  Administered 2024-01-31: 3 mL via INTRAVENOUS

## 2024-01-30 MED ORDER — MORPHINE SULFATE ER 15 MG PO TBCR
15.0000 mg | EXTENDED_RELEASE_TABLET | Freq: Two times a day (BID) | ORAL | Status: DC
  Administered 2024-01-31: 15 mg via ORAL
  Filled 2024-01-30 (×2): qty 1

## 2024-01-30 MED ORDER — APIXABAN 5 MG PO TABS
5.0000 mg | ORAL_TABLET | Freq: Two times a day (BID) | ORAL | Status: DC
  Administered 2024-01-31: 5 mg via ORAL
  Filled 2024-01-30 (×2): qty 1

## 2024-01-30 MED ORDER — METFORMIN HCL 500 MG PO TABS
1000.0000 mg | ORAL_TABLET | Freq: Two times a day (BID) | ORAL | Status: DC
  Administered 2024-01-31: 1000 mg via ORAL
  Filled 2024-01-30 (×2): qty 2

## 2024-01-30 MED ORDER — POTASSIUM CHLORIDE CRYS ER 20 MEQ PO TBCR
40.0000 meq | EXTENDED_RELEASE_TABLET | ORAL | Status: AC
  Filled 2024-01-30 (×3): qty 2

## 2024-01-30 MED ORDER — SODIUM CHLORIDE 0.9% FLUSH: 3.0000 mL | INTRAVENOUS | Status: DC | PRN

## 2024-01-30 MED ORDER — OXYCODONE HCL 5 MG PO TABS
5.0000 mg | ORAL_TABLET | ORAL | Status: DC | PRN
  Administered 2024-01-30: 5 mg via ORAL
  Filled 2024-01-30: qty 1

## 2024-01-30 MED ORDER — POTASSIUM CHLORIDE CRYS ER 20 MEQ PO TBCR
20.0000 meq | EXTENDED_RELEASE_TABLET | Freq: Every day | ORAL | Status: DC
  Administered 2024-01-31: 20 meq via ORAL
  Filled 2024-01-30: qty 1

## 2024-01-30 MED ORDER — INSULIN ASPART 100 UNIT/ML IJ SOLN
0.0000 [IU] | Freq: Three times a day (TID) | INTRAMUSCULAR | Status: DC
  Administered 2024-01-31: 3 [IU] via SUBCUTANEOUS
  Filled 2024-01-30 (×2): qty 1

## 2024-01-30 MED ORDER — SPIRONOLACTONE 25 MG PO TABS
50.0000 mg | ORAL_TABLET | Freq: Every day | ORAL | Status: DC
  Administered 2024-01-31: 50 mg via ORAL
  Filled 2024-01-30: qty 2

## 2024-01-30 MED ORDER — POTASSIUM CHLORIDE 10 MEQ/100ML IV SOLN
10.0000 meq | INTRAVENOUS | Status: AC
  Filled 2024-01-30: qty 100

## 2024-01-30 MED ORDER — ONDANSETRON HCL 4 MG/2ML IJ SOLN
4.0000 mg | Freq: Four times a day (QID) | INTRAMUSCULAR | Status: DC | PRN
  Administered 2024-01-31: 4 mg via INTRAVENOUS
  Filled 2024-01-30: qty 2

## 2024-01-30 MED ORDER — METOPROLOL TARTRATE 50 MG PO TABS
50.0000 mg | ORAL_TABLET | Freq: Two times a day (BID) | ORAL | Status: DC
  Administered 2024-01-31: 50 mg via ORAL
  Filled 2024-01-30 (×2): qty 1

## 2024-01-30 MED ORDER — ACETAMINOPHEN 325 MG PO TABS: 650.0000 mg | ORAL_TABLET | ORAL | Status: DC | PRN

## 2024-01-30 MED ORDER — ALBUMIN HUMAN 25 % IV SOLN
25.0000 g | Freq: Four times a day (QID) | INTRAVENOUS | Status: AC
  Administered 2024-01-31 (×3): 25 g via INTRAVENOUS
  Filled 2024-01-30 (×4): qty 100

## 2024-01-30 MED ORDER — IPRATROPIUM-ALBUTEROL 0.5-2.5 (3) MG/3ML IN SOLN: 3.0000 mL | RESPIRATORY_TRACT | Status: DC | PRN

## 2024-01-30 MED ORDER — FLUTICASONE FUROATE-VILANTEROL 100-25 MCG/ACT IN AEPB
1.0000 | INHALATION_SPRAY | Freq: Every day | RESPIRATORY_TRACT | Status: DC
  Filled 2024-01-30: qty 28

## 2024-01-30 MED ORDER — ONDANSETRON 4 MG PO TBDP: 4.0000 mg | ORAL_TABLET | Freq: Three times a day (TID) | ORAL | Status: DC | PRN

## 2024-01-30 MED ORDER — AZELASTINE HCL 0.1 % NA SOLN
1.0000 | Freq: Two times a day (BID) | NASAL | Status: DC
  Filled 2024-01-30: qty 30

## 2024-01-30 MED ORDER — TRAZODONE HCL 50 MG PO TABS
25.0000 mg | ORAL_TABLET | Freq: Every evening | ORAL | Status: DC | PRN
  Filled 2024-01-30: qty 1

## 2024-01-30 MED ORDER — SODIUM CHLORIDE 0.9 % IV SOLN
2.0000 g | Freq: Once | INTRAVENOUS | Status: AC
  Filled 2024-01-30: qty 20

## 2024-01-30 MED ORDER — ROPINIROLE HCL 1 MG PO TABS
2.0000 mg | ORAL_TABLET | Freq: Three times a day (TID) | ORAL | Status: DC
  Administered 2024-01-31 (×2): 2 mg via ORAL
  Filled 2024-01-30 (×4): qty 2

## 2024-01-30 MED ORDER — GABAPENTIN 100 MG PO CAPS
100.0000 mg | ORAL_CAPSULE | Freq: Three times a day (TID) | ORAL | Status: DC
  Administered 2024-01-31 (×2): 100 mg via ORAL
  Filled 2024-01-30 (×4): qty 1

## 2024-01-30 MED ORDER — ALBUTEROL SULFATE (2.5 MG/3ML) 0.083% IN NEBU: 3.0000 mL | INHALATION_SOLUTION | RESPIRATORY_TRACT | Status: DC | PRN

## 2024-01-30 MED ORDER — SODIUM CHLORIDE 0.9 % IV SOLN: 250.0000 mL | INTRAVENOUS | Status: AC | PRN

## 2024-01-30 MED ORDER — LORATADINE 10 MG PO TABS
10.0000 mg | ORAL_TABLET | Freq: Every day | ORAL | Status: DC
  Filled 2024-01-30: qty 1

## 2024-01-30 MED ORDER — LEVOCETIRIZINE DIHYDROCHLORIDE 5 MG PO TABS: 5.0000 mg | ORAL_TABLET | Freq: Every day | ORAL | Status: DC

## 2024-01-30 MED ORDER — DIPHENOXYLATE-ATROPINE 2.5-0.025 MG PO TABS: 2.0000 | ORAL_TABLET | Freq: Four times a day (QID) | ORAL | Status: DC | PRN

## 2024-01-30 MED ORDER — ASPIRIN 81 MG PO CHEW
324.0000 mg | CHEWABLE_TABLET | Freq: Once | ORAL | Status: AC
  Filled 2024-01-30: qty 4

## 2024-01-30 MED ORDER — SODIUM CHLORIDE 0.9 % IV SOLN
2.0000 g | INTRAVENOUS | Status: DC
  Administered 2024-01-31: 2 g via INTRAVENOUS
  Filled 2024-01-30: qty 20

## 2024-01-30 NOTE — ED Notes (Signed)
 CCMD called to monitor patient

## 2024-01-30 NOTE — ED Notes (Signed)
 Patient needed assistance at 14:54pm. ED tech assist pt with using restroom, placed a female external, change of clothes and linen. Charge nurse was aware. High risk fall patient, along with socks, bracelet and bed alarm. Patient is on a LASIX  at this time.

## 2024-01-30 NOTE — ED Notes (Signed)
 IV team in room now, accessing port.

## 2024-01-30 NOTE — H&P (Signed)
 History and Physical    Kristina Cruz ZOX:[id number] DOB: [birth year] DOA: 01/30/2024  PCP: Chucky Craver, FNP (Confirm with patient/family/NH records and if not entered, this has to be entered at [facility] point of entry) Patient coming from: Home  I have personally briefly reviewed patient's old medical records in [facility]  Chief Complaint: Chest pain, leg swelling, palpitations  HPI: Kristina Cruz is a 63 y.o. female with medical history significant of PAF on Eliquis  status post recent cardioversion, stage V pancreatic cancer with liver metastasis on active chemotherapy, IIDM, morbid obesity, COPD and asthma, presented with multiple complaints including chest pain palpitations and increasing peripheral edema.  Patient reported that last 2 to 3 days  have been stressed out and has had intermittent palpitations and 1 episode of chest pain last night, centrally located nonradiating associated with shortness of breath and palpitations.  Chest pain subsided by its own within 5 minutes.  And later last night she had another brief episode of chest pain and sepsis based on as well.  Meantime she noticed increasing leg swelling for about 1 week.  She has been compliant with her Lasix  and Eliquis .  No cough, no fever or chills. ED Course: Afebrile, borderline tachycardia, blood pressure 104/57 O2 saturation 95% on room air.  Chest x-ray negative for acute infiltrates and blood work showed hemoglobin 8.8 K2.9 BUN 7 creatinine 0.9 glucose 156 albumin  2.5.  Troponin 269> 270.  UA showed 3+ WBC 1+ RBC.  Ceftriaxone  x 1 given in the ED.  Review of Systems: As per HPI otherwise 14 point review of systems negative.    Past Medical History:  Diagnosis Date   Anemia    Asthma    Back pain    Cataract    Diabetes mellitus without complication (HCC)    GERD (gastroesophageal reflux disease)    History of echocardiogram    Hypertension    Morbid obesity with BMI of 60.0-69.9, adult (HCC)     Restless leg syndrome     Past Surgical History:  Procedure Laterality Date   ABDOMINAL HYSTERECTOMY     CARDIOVERSION N/A 12/28/2023   Procedure: CARDIOVERSION;  Surgeon: Constancia Delton, MD;  Location: [facility] ORS;  Service: Cardiovascular;  Laterality: N/A;   CATARACT EXTRACTION W/PHACO Left 04/13/2020   Procedure: CATARACT EXTRACTION PHACO AND INTRAOCULAR LENS PLACEMENT (IOC);  Surgeon: Ola Berger, MD;  Location: [facility] ORS;  Service: Ophthalmology;  Laterality: Left;  US  00:36.0 CDE 5.95 Fluid Pack lot # [id number]   HYSTEROSCOPY WITH D & C N/A 07/14/2017   Procedure: DILATATION AND CURETTAGE /HYSTEROSCOPY;  Surgeon: Alben Alma, MD;  Location: [facility] ORS;  Service: Gynecology;  Laterality: N/A;   IR IMAGING GUIDED PORT INSERTION  12/17/2023   POLYPECTOMY  2015   TEE WITHOUT CARDIOVERSION N/A 12/28/2023   Procedure: ECHOCARDIOGRAM, TRANSESOPHAGEAL;  Surgeon: Constancia Delton, MD;  Location: [facility] ORS;  Service: Cardiovascular;  Laterality: N/A;     reports that she has never smoked. She has never used smokeless tobacco. She reports that she does not drink alcohol and does not use drugs.  Allergies  Allergen Reactions   Peanut-Containing Drug Products     Family History  Problem Relation Age of Onset   Breast cancer Mother 97   Diabetes Mother    Hypertension Mother    Ovarian cancer Paternal Aunt        ?   Diabetes Father    Hypertension Father      Prior to Admission medications  Medication Sig Start Date End Date Taking? Authorizing Provider  acetaminophen  (TYLENOL  8 HOUR ARTHRITIS PAIN) 650 MG CR tablet Take 3 tablets (1,950 mg total) by mouth every 8 (eight) hours as needed for pain. Do not exceed 4000 mg total in a day.  Home med. 04/21/23   Garrison Kanner, MD  apixaban  (ELIQUIS ) 5 MG TABS tablet Take 1 tablet (5 mg total) by mouth 2 (two) times daily. 12/30/23 01/29/24  Alphonsus Jeans, MD  azelastine (ASTELIN) 0.1 % nasal spray Place 1 spray into both nostrils 2  (two) times daily. 10/22/23   [provider]  diphenoxylate -atropine  (LOMOTIL ) 2.5-0.025 MG tablet Take 2 tablets by mouth every 6 (six) hours as needed (for diarrhea unrelieved by imodium). 12/21/23   Nelda Balsam, NP  EPINEPHrine  0.3 mg/0.3 mL IJ SOAJ injection Inject 0.3 mg into the muscle as needed for anaphylaxis. 09/19/20   Loman Risk, MD  fluticasone  (FLONASE ) 50 MCG/ACT nasal spray Place 1 spray into both nostrils daily. 09/29/23   Patel, Sona, MD  fluticasone  furoate-vilanterol (BREO ELLIPTA ) 100-25 MCG/ACT AEPB Inhale 1 puff into the lungs daily. 09/29/23   Patel, Sona, MD  furosemide  (LASIX ) 40 MG tablet Take 40 mg by mouth every other day. 03/23/20   [provider]  gabapentin  (NEURONTIN ) 100 MG capsule Take 1 capsule (100 mg total) by mouth 3 (three) times daily. 12/11/23   [name], MD  ipratropium-albuterol  (DUONEB) 0.5-2.5 (3) MG/3ML SOLN Take 3 mLs by nebulization every 4 (four) hours as needed (wheezing / shortness of breath). 09/29/23   Patel, Sona, MD  levocetirizine (XYZAL) 5 MG tablet Take 5 mg by mouth daily. 10/21/23   [provider]  lidocaine -prilocaine  (EMLA ) cream Apply 1 Application topically as needed. Apply to port and cover with saran wrap 1-2 hours prior to port access 01/08/24   [name], MD  magic mouthwash (lidocaine , diphenhydrAMINE , alum & mag hydroxide) suspension Swish and swallow 5 mLs 4 (four) times daily as needed for mouth pain. 12/18/23   Nelda Balsam, NP  metFORMIN  (GLUCOPHAGE ) 1000 MG tablet Take 1,000 mg by mouth 2 (two) times daily. 11/27/22   [provider]  metoprolol  tartrate (LOPRESSOR ) 25 MG tablet Take 1 tablet (25 mg total) by mouth 2 (two) times daily. 12/30/23 01/29/24  Alphonsus Jeans, MD  morphine  (MS CONTIN ) 15 MG 12 hr tablet Take 1 tablet (15 mg total) by mouth every 12 (twelve) hours. 11/20/23   Borders, Carlene Che, NP  naloxone  (NARCAN ) nasal spray 4 mg/0.1 mL SPRAY 1 SPRAY INTO ONE NOSTRIL AS DIRECTED FOR  OPIOID OVERDOSE (TURN PERSON ON SIDE AFTER DOSE. IF NO RESPONSE IN 2-3 MINUTES OR PERSON RESPONDS BUT RELAPSES, REPEAT USING A NEW SPRAY DEVICE AND SPRAY INTO THE OTHER NOSTRIL. CALL 911 AFTER USE.) * EMERGENCY USE ONLY * 11/20/23   Borders, Carlene Che, NP  ondansetron  (ZOFRAN -ODT) 4 MG disintegrating tablet Take 1 tablet (4 mg total) by mouth every 8 (eight) hours as needed for nausea or vomiting. 12/29/23   Borders, Carlene Che, NP  oxyCODONE  (OXY IR/ROXICODONE ) 5 MG immediate release tablet Take 1 tablet (5 mg total) by mouth every 4 (four) hours as needed for moderate pain (pain score 4-6) or severe pain (pain score 7-10). 12/04/23   [name], MD  potassium chloride  SA (KLOR-CON  M) 20 MEQ tablet Take 1 tablet (20 mEq total) by mouth daily. 12/11/23   [name], MD  prochlorperazine  (COMPAZINE ) 10 MG tablet Take 1 tablet (10 mg total) by  mouth every 6 (six) hours as needed for nausea or vomiting. 11/17/23   [name], MD  rOPINIRole  (REQUIP ) 2 MG tablet Take 2 mg by mouth in the morning and at bedtime.     [provider]  Semaglutide, 1 MG/DOSE, 4 MG/3ML SOPN Inject 1 mg into the skin once a week. 10/29/23   [provider]  traZODone  (DESYREL ) 50 MG tablet Take 0.5-1 tablets (25-50 mg total) by mouth at bedtime as needed for sleep. 01/08/24   Borders, Carlene Che, NP  VENTOLIN  HFA 108 (90 Base) MCG/ACT inhaler Inhale 2 puffs into the lungs every 4 (four) hours as needed for wheezing or shortness of breath. 09/29/23   Melvinia Stager, MD    Physical Exam: Vitals:   01/30/24 0957 01/30/24 0958  BP:  (!) 104/57  Pulse:  95  Resp:  16  Temp:  98.4 F (36.9 C)  TempSrc:  Oral  SpO2:  95%  Weight: 128.8 kg   Height: 5' 2 (1.575 m)     Constitutional: NAD, calm, comfortable Vitals:   01/30/24 0957 01/30/24 0958  BP:  (!) 104/57  Pulse:  95  Resp:  16  Temp:  98.4 F (36.9 C)  TempSrc:  Oral  SpO2:  95%  Weight: 128.8 kg   Height: 5' 2 (1.575 m)    Eyes: PERRL, lids and conjunctivae  normal ENMT: Mucous membranes are moist. Posterior pharynx clear of any exudate or lesions.Normal dentition.  Neck: normal, supple, no masses, no thyromegaly Respiratory: clear to auscultation bilaterally, no wheezing, no crackles. Normal respiratory effort. No accessory muscle use.  Cardiovascular: Regular rate and rhythm, no murmurs / rubs / gallops. 2+ extremity edema. 2+ pedal pulses. No carotid bruits.  Abdomen: no tenderness, no masses palpated. No hepatosplenomegaly. Bowel sounds positive.  Musculoskeletal: no clubbing / cyanosis. No joint deformity upper and lower extremities. Good ROM, no contractures. Normal muscle tone.  Skin: no rashes, lesions, ulcers. No induration Neurologic: CN 2-12 grossly intact. Sensation intact, DTR normal. Strength 5/5 in all 4.  Psychiatric: Normal judgment and insight. Alert and oriented x 3. Normal mood.    Labs on Admission: I have personally reviewed following labs and imaging studies  CBC: Recent Labs  Lab 01/30/24 1036  WBC 4.3  NEUTROABS 3.2  HGB 8.8*  HCT 26.5*  MCV 94.0  PLT 130*   Basic Metabolic Panel: Recent Labs  Lab 01/30/24 1036  NA 137  K 2.9*  CL 100  CO2 27  GLUCOSE 156*  BUN 7*  CREATININE 0.92  CALCIUM  7.9*  MG 1.8   GFR: Estimated Creatinine Clearance: 78.5 mL/min (by C-G formula based on SCr of 0.92 mg/dL). Liver Function Tests: Recent Labs  Lab 01/30/24 1036  AST 12*  ALT 8  ALKPHOS 65  BILITOT 0.7  PROT 6.9  ALBUMIN  2.5*   No results for input(s): LIPASE, AMYLASE in the last 168 hours. No results for input(s): AMMONIA in the last 168 hours. Coagulation Profile: No results for input(s): INR, PROTIME in the last 168 hours. Cardiac Enzymes: No results for input(s): CKTOTAL, CKMB, CKMBINDEX, TROPONINI in the last 168 hours. BNP (last 3 results) No results for input(s): PROBNP in the last 8760 hours. HbA1C: No results for input(s): HGBA1C in the last 72 hours. CBG: No results  for input(s): GLUCAP in the last 168 hours. Lipid Profile: No results for input(s): CHOL, HDL, LDLCALC, TRIG, CHOLHDL, LDLDIRECT in the last 72 hours. Thyroid  Function Tests: No results for input(s): TSH, T4TOTAL,  FREET4, T3FREE, THYROIDAB in the last 72 hours. Anemia Panel: No results for input(s): VITAMINB12, FOLATE, FERRITIN, TIBC, IRON, RETICCTPCT in the last 72 hours. Urine analysis:    Component Value Date/Time   COLORURINE YELLOW (A) 01/30/2024 1115   APPEARANCEUR HAZY (A) 01/30/2024 1115   APPEARANCEUR Cloudy 08/11/2013 0124   LABSPEC 1.009 01/30/2024 1115   LABSPEC 1.021 08/11/2013 0124   PHURINE 6.0 01/30/2024 1115   GLUCOSEU NEGATIVE 01/30/2024 1115   GLUCOSEU Negative 08/11/2013 0124   HGBUR NEGATIVE 01/30/2024 1115   BILIRUBINUR NEGATIVE 01/30/2024 1115   BILIRUBINUR Negative 08/11/2013 0124   KETONESUR NEGATIVE 01/30/2024 1115   PROTEINUR NEGATIVE 01/30/2024 1115   NITRITE NEGATIVE 01/30/2024 1115   LEUKOCYTESUR MODERATE (A) 01/30/2024 1115   LEUKOCYTESUR Negative 08/11/2013 0124    Radiological Exams on Admission: DG Chest Portable 1 View Result Date: 01/30/2024 CLINICAL DATA:  sob EXAM: PORTABLE CHEST 1 VIEW COMPARISON:  Dec 25, 2023 FINDINGS: The cardiomediastinal silhouette is unchanged and enlarged in contour.RIGHT chest port with tip terminating over the superior cavoatrial junction. No pleural effusion. No pneumothorax. Known cavitary LEFT-sided pulmonary nodule is not visualized radiographically. No acute pleuroparenchymal abnormality. IMPRESSION: 1. No acute cardiopulmonary abnormality. 2. Known cavitary LEFT-sided pulmonary nodule is not visualized radiographically. Electronically Signed   By: Clancy Crimes M.D.   On: 01/30/2024 10:28    EKG: Independently reviewed.  Sinus tachycardia, no acute ST changes.  Assessment/Plan Principal Problem:   CHF (congestive heart failure) (HCC) Active Problems:   Acute heart  failure (HCC)   Troponin level elevated  (please populate well all problems here in Problem List. (For example, if patient is on BP meds at home and you resume or decide to hold them, it is a problem that needs to be her. Same for CAD, COPD, HLD and so on)  Troponin elevations - Likely secondary to demand ischemia from acute on chronic HFpEF decompensation also there is a possibility of breakthrough A-fib with RVR which happen intermittently in the last couple of days. - ACS unlikely given the trending of troponin pattern is flat.  Discussed with cardiology, agreed with echocardiogram.  Continue to trend troponins - Continue telemonitoring  Acute on chronic HFpEF decompensation - Versus decompensation of chronic liver failure from space-occupying metastatic lesions in the liver.  As patient also has concurrent synthetic dysfunction of severe hypoalbuminemia compatible with chronic liver failure. - Diuresis Lasix  60 mg twice daily, titrate according to response - Will give a total of 4 doses of IV albumin  2 point diuresis - Start spironolactone  Hypokalemia - IV and p.o. replacement - Magnesium  and phosphorus level pending - Repeat BMP tomorrow  IIDM - Continue metformin  - SSI  COPD/asthma - No acute concern  Stage V pancreatic cancer with liver metastasis - Outpatient follow-up with oncology for continueous chemotherapy  Chronic normocytic anemia - Denied any black tarry stool or blood in the stool - Check iron study reticulocyte count  DVT prophylaxis: Eliquis  Code Status: Full code Family Communication: None at bedside Disposition Plan: Expect less than 2 midnight hospital stay Consults called: Curbside consult with cardiology, if any abnormal finding on telemonitoring or echocardiogram, reconsult cardiology Admission status: Tele obs   [place] MD [facility] Pager [phone]  01/30/2024, 1:57 PM

## 2024-01-30 NOTE — ED Notes (Signed)
 Kristina Bowels, MD, made aware of troponin 269

## 2024-01-30 NOTE — ED Triage Notes (Signed)
 Pt to ED via [facility] from home for c/o bilateral pitting edema that has been getting worse for the last week. Pt also states dizziness, hx vertigo.

## 2024-01-30 NOTE — ED Provider Notes (Addendum)
 [facility] Provider Note    Event Date/Time   First MD Initiated Contact with Patient 01/30/24 [phone]     (approximate)   History   Leg Swelling   HPI  Kristina Cruz is a 63 y.o. female with pancreatic cancer currently on chemotherapy with last dose of 1 week ago who comes in with concerns for leg swelling, shortness of breath.  Patient reports leg swelling is getting worse over the past 1 week.  She reports that she has been taking her Lasix  40 mg once daily but is continue to get worse.  She reports a little bit of increasing shortness of breath and a little bit of chest tightness yesterday.  She had a little episode of dizziness earlier but reports a history of vertigo denies any dizziness currently.  She denies any falls hitting her head, headaches, abdominal pain  I reviewed the note from 01/22/2024 where patient has a history of A-fib which she does take Eliquis  for, pancreatic cancer with mets to the liver   Physical Exam   Triage Vital Signs: ED Triage Vitals [01/30/24 0956]  Encounter Vitals Group     BP      Girls Systolic BP Percentile      Girls Diastolic BP Percentile      Boys Systolic BP Percentile      Boys Diastolic BP Percentile      Pulse      Resp      Temp      Temp src      SpO2      Weight      Height      Head Circumference      Peak Flow      Pain Score 0     Pain Loc      Pain Education      Exclude from Growth Chart     Most recent vital signs: Vitals:   01/30/24 0958  BP: (!) 104/57  Pulse: 95  Resp: 16  Temp: 98.4 F (36.9 C)  SpO2: 95%     General: Awake, no distress.  CV:  Good peripheral perfusion.  Resp:  Normal effort.  Clear lungs, no murmur.  Port noted right chest wall Abd:  No distention.  Soft and nontender Other:  3+ edema bilaterally with good distal pulses, quarter sized fluid-filled bulla noted Cranial nerves II to XII are intact.  Equal strength in arms and legs.  Finger-nose intact  bilaterally.  ED Results / Procedures / Treatments   Labs (all labs ordered are listed, but only abnormal results are displayed) Labs Reviewed  URINALYSIS, ROUTINE W REFLEX MICROSCOPIC - Abnormal; Notable for the following components:      Result Value   Color, Urine YELLOW (*)    APPearance HAZY (*)    Leukocytes,Ua MODERATE (*)    Bacteria, UA MANY (*)    All other components within normal limits  CBC WITH DIFFERENTIAL/PLATELET - Abnormal; Notable for the following components:   RBC 2.82 (*)    Hemoglobin 8.8 (*)    HCT 26.5 (*)    RDW 18.3 (*)    Platelets 130 (*)    nRBC 0.5 (*)    All other components within normal limits  COMPREHENSIVE METABOLIC PANEL WITH GFR - Abnormal; Notable for the following components:   Potassium 2.9 (*)    Glucose, Bld 156 (*)    BUN 7 (*)    Calcium  7.9 (*)    Albumin   2.5 (*)    AST 12 (*)    All other components within normal limits  TROPONIN I (HIGH SENSITIVITY) - Abnormal; Notable for the following components:   Troponin I (High Sensitivity) 269 (*)    All other components within normal limits  BRAIN NATRIURETIC PEPTIDE  MAGNESIUM   CBC WITH DIFFERENTIAL/PLATELET  TROPONIN I (HIGH SENSITIVITY)     EKG  My interpretation of EKG:  Sinus rate of 94 without any ST elevation or T wave inversions, right bundle branch block with left anterior fascicular block.  Similar to prior EKG  RADIOLOGY I have reviewed the xray personally and interpreted no edema noted   PROCEDURES:  Critical Care performed: Yes, see critical care procedure note(s)  .Critical Care  Performed by: Lubertha Rush, MD Authorized by: Lubertha Rush, MD   Critical care provider statement:    Critical care time (minutes):  30   Critical care was necessary to treat or prevent imminent or life-threatening deterioration of the following conditions:  Cardiac failure   Critical care was time spent personally by me on the following activities:  Development of treatment  plan with patient or surrogate, discussions with consultants, evaluation of patient's response to treatment, examination of patient, ordering and review of laboratory studies, ordering and review of radiographic studies, ordering and performing treatments and interventions, pulse oximetry, re-evaluation of patient's condition and review of old charts .1-3 Lead EKG Interpretation  Performed by: Lubertha Rush, MD Authorized by: Lubertha Rush, MD     Interpretation: normal     ECG rate:  90   ECG rate assessment: normal     Rhythm: sinus rhythm     Ectopy: none     Conduction: normal      MEDICATIONS ORDERED IN ED: Medications  potassium chloride  10 mEq in 100 mL IVPB (has no administration in time range)     IMPRESSION / MDM / ASSESSMENT AND PLAN / ED COURSE  I reviewed the triage vital signs and the nursing notes.   Patient's presentation is most consistent with acute presentation with potential threat to life or bodily function.   Patient comes in with leg swelling episode of chest tightness.  She did report some dizziness earlier but states that it was only when she moved and it had resolved when I evaluated to her she is neuro intact therefore stroke code was not called.  Will get workup to evaluate for ACS, CHF given some chest tightness and leg swelling.  No chest pain at this time.  She is already taking Eliquis  for A-fib so doubt PE.  Abdomen is soft and nontender low suspicion for acute abdominal process in her abdomen she does have known pancreatic cancer.  UA with concern for possible UTI will send for culture.  CBC shows stable hemoglobin around baseline at 8.8 she denies any bleeding issues.  She is on Eliquis .  CMP shows potassium of 2.9 will check mag and give some IV potassium.  Her troponin is elevated at 269 patient will need to be started on heparin .  Will get CT head first to ensure no evidence of intercranial hemorrhage given she did report some dizziness  earlier.  When I went to reevaluate her she continues deny any current dizziness only when she stands up and walks but out of precaution before starting blood thinner we will get CT head to ensure no large met or bleed that would preclude wanting to start her on heparin .  12:40 PM Discussed  again with patient she does not want to have CT head done as she cannot lay flat.  She denies any headache or dizziness at this time on repeat cranial nerves are intact.  She states that she has not had any falls.  She reports being compliant with her Eliquis  and having no issues with it.  Therefore seems unlikely to have acute intracranial problem.  I will load her with aspirin  for her elevated troponin and we can trend this and if upgoing I think it be reasonable to convert Eliquis  to heparin .  Will discuss in the hospital team for admission.    The patient is on the cardiac monitor to evaluate for evidence of arrhythmia and/or significant heart rate changes.      FINAL CLINICAL IMPRESSION(S) / ED DIAGNOSES   Final diagnoses:  Leg edema  NSTEMI (non-ST elevated myocardial infarction) (HCC)  Malignant neoplasm of pancreas, unspecified location of malignancy (HCC)  Hypokalemia     Rx / DC Orders   ED Discharge Orders     None        Note:  This document was prepared using Dragon voice recognition software and may include unintentional dictation errors.   Lubertha Rush, MD 01/30/24 1242    Lubertha Rush, MD 01/30/24 [phone]

## 2024-01-30 NOTE — ED Notes (Signed)
 Lab called to collect blood work due to patient being difficult stick

## 2024-01-31 DIAGNOSIS — R600 Localized edema: Secondary | ICD-10-CM | POA: Diagnosis not present

## 2024-01-31 DIAGNOSIS — I110 Hypertensive heart disease with heart failure: Secondary | ICD-10-CM | POA: Diagnosis not present

## 2024-01-31 DIAGNOSIS — I5030 Unspecified diastolic (congestive) heart failure: Secondary | ICD-10-CM

## 2024-01-31 DIAGNOSIS — R7989 Other specified abnormal findings of blood chemistry: Secondary | ICD-10-CM | POA: Diagnosis not present

## 2024-01-31 DIAGNOSIS — C259 Malignant neoplasm of pancreas, unspecified: Secondary | ICD-10-CM

## 2024-01-31 DIAGNOSIS — E876 Hypokalemia: Secondary | ICD-10-CM

## 2024-01-31 LAB — BASIC METABOLIC PANEL WITH GFR
Anion gap: 7 (ref 5–15)
BUN: 6 mg/dL — ABNORMAL LOW (ref 8–23)
CO2: 29 mmol/L (ref 22–32)
Calcium: 8 mg/dL — ABNORMAL LOW (ref 8.9–10.3)
Chloride: 102 mmol/L (ref 98–111)
Creatinine, Ser: 0.87 mg/dL (ref 0.44–1.00)
GFR, Estimated: >60 mL/min (ref >60)
Glucose, Bld: 155 mg/dL — ABNORMAL HIGH (ref 70–99)
Potassium: 3.9 mmol/L (ref 3.5–5.1)
Sodium: 138 mmol/L (ref 135–145)

## 2024-01-31 LAB — TROPONIN I (HIGH SENSITIVITY): Troponin I (High Sensitivity): 193 ng/L (ref <18)

## 2024-01-31 LAB — GLUCOSE, CAPILLARY
Glucose-Capillary: 151 mg/dL — ABNORMAL HIGH (ref 70–99)
Glucose-Capillary: 113 mg/dL — ABNORMAL HIGH (ref 70–99)

## 2024-01-31 MED ORDER — CEPHALEXIN 500 MG PO CAPS: 500.0000 mg | ORAL_CAPSULE | Freq: Three times a day (TID) | ORAL | 0 refills | Status: AC

## 2024-01-31 MED ORDER — SPIRONOLACTONE 50 MG PO TABS: 50.0000 mg | ORAL_TABLET | Freq: Every day | ORAL | 1 refills | Status: DC

## 2024-01-31 MED ORDER — CHLORHEXIDINE GLUCONATE CLOTH 2 % EX PADS
6.0000 | MEDICATED_PAD | Freq: Every day | CUTANEOUS | Status: DC
  Administered 2024-01-31: 6 via TOPICAL

## 2024-01-31 MED ORDER — SODIUM CHLORIDE 0.9% FLUSH: 10.0000 mL | INTRAVENOUS | Status: DC | PRN

## 2024-01-31 MED ORDER — HEPARIN SOD (PORK) LOCK FLUSH 100 UNIT/ML IV SOLN
500.0000 [IU] | INTRAVENOUS | Status: AC | PRN
Start: 2024-01-31 — End: 2024-01-31
  Administered 2024-01-31: 500 [IU]

## 2024-01-31 MED ORDER — PROCHLORPERAZINE EDISYLATE 10 MG/2ML IJ SOLN
10.0000 mg | Freq: Once | INTRAMUSCULAR | Status: AC
  Administered 2024-01-31: 10 mg via INTRAVENOUS
  Filled 2024-01-31: qty 2

## 2024-01-31 MED ORDER — SODIUM CHLORIDE 0.9% FLUSH
10.0000 mL | Freq: Two times a day (BID) | INTRAVENOUS | Status: DC
  Administered 2024-01-31: 10 mL

## 2024-01-31 NOTE — Discharge Summary (Signed)
 Physician Discharge Summary   Patient: Kristina Cruz MRN: [id number] DOB: [birth year]  Admit date:     01/30/2024  Discharge date: 01/31/24  Discharge Physician: Luna Salinas   PCP: Chucky Craver, FNP   Recommendations at discharge:  Please obtain CBC and CMP on follow-up Please follow-up final urine culture results Follow-up with oncology Follow-up with primary care provider  Discharge Diagnoses: Principal Problem:   CHF (congestive heart failure) (HCC) Active Problems:   Acute heart failure (HCC)   Malignant neoplasm of pancreas (HCC)   Leg edema   Troponin level elevated   Hospital Course: Taken from H&P.  Kristina Cruz is a 63 y.o. female with medical history significant of PAF on Eliquis  status post recent cardioversion, stage V pancreatic cancer with liver metastasis on active chemotherapy, IIDM, morbid obesity, COPD and asthma, presented with multiple complaints including chest pain palpitations and increasing peripheral edema.  Patient has been compliant with her Lasix  and Eliquis .  Having intermittent transient chest pain lasted for few minutes, associated with some shortness of breath and palpitations.  On presentation borderline tachycardia otherwise stable vital.  Labs with hemoglobin of 8.8, potassium 2.9, albumin  2.5,Troponin 269> 270.  UA showed 3+ WBC 1+ RBC.  Chest x-ray negative for acute infiltrate.  Patient was given IV albumin  and started on IV diuresis.  BNP of 70. Concern of decompensated liver versus acute on chronic HFpEF. Urine cultures were sent and she was started on ceftriaxone  for concern of UTI.  6/15: Vital stable, troponin 193, BMP stable, iron studies with borderline low iron and more consistent with an of chronic disease.  Her chest pain and shortness of breath has been resolved.  Trace lower extremity edema.  Patient has an echocardiogram done last month with normal EF.  Her BNP was normal so likely not CHF exacerbation.  Troponin  downtrending and no ongoing chest pain-troponin was mildly elevated likely due to demand ischemia.  Patient need to restart home Lasix , 50 mg of spironolactone was also added as her hypervolemia was likely due to liver disease and hypoalbuminemia.  She was given IV albumin .  She need to follow-up closely with her oncologist as she has pretty advanced pancreatic cancer which is most likely contributory.  We discontinued her home potassium supplement as started on spironolactone.  She need close monitoring of her BMP and if needed potassium supplement can be restarted.  Patient now appears at her baseline.  She was also given 4 more days of Keflex for concern of UTI.  Urine cultures are pending, her primary care provider can follow-up and make changes to the antibiotics if needed.  She will continue the rest of her home medications and follow-up with her providers closely for further management.  Patient with advanced pancreatic cancer with overall poor prognosis.   Consultants: None Procedures performed: None Disposition: Home Diet recommendation:  Discharge Diet Orders (From admission, onward)     Start     Ordered   01/31/24 0000  Diet - low sodium heart healthy        01/31/24 1046           Cardiac and Carb modified diet DISCHARGE MEDICATION: Allergies as of 01/31/2024       Reactions   Peanut-containing Drug Products         Medication List     STOP taking these medications    potassium chloride  SA 20 MEQ tablet Commonly known as: KLOR-CON  M   Semaglutide (1 MG/DOSE) 4 MG/3ML Sopn  TAKE these medications    acetaminophen  650 MG CR tablet Commonly known as: Tylenol  8 Hour Arthritis Pain Take 3 tablets (1,950 mg total) by mouth every 8 (eight) hours as needed for pain. Do not exceed 4000 mg total in a day.  Home med.   apixaban  5 MG Tabs tablet Commonly known as: ELIQUIS  Take 1 tablet (5 mg total) by mouth 2 (two) times daily.   azelastine 0.1 % nasal  spray Commonly known as: ASTELIN Place 1 spray into both nostrils 2 (two) times daily.   baclofen  10 MG tablet Commonly known as: LIORESAL  Take 10 mg by mouth 3 (three) times daily.   cephALEXin 500 MG capsule Commonly known as: KEFLEX Take 1 capsule (500 mg total) by mouth 3 (three) times daily for 4 days.   diphenoxylate -atropine  2.5-0.025 MG tablet Commonly known as: LOMOTIL  Take 2 tablets by mouth every 6 (six) hours as needed (for diarrhea unrelieved by imodium).   EPINEPHrine  0.3 mg/0.3 mL Soaj injection Commonly known as: EPI-PEN Inject 0.3 mg into the muscle as needed for anaphylaxis.   fluticasone  50 MCG/ACT nasal spray Commonly known as: FLONASE  Place 1 spray into both nostrils daily.   fluticasone  furoate-vilanterol 100-25 MCG/ACT Aepb Commonly known as: Breo Ellipta  Inhale 1 puff into the lungs daily.   furosemide  40 MG tablet Commonly known as: LASIX  Take 40 mg by mouth daily.   gabapentin  100 MG capsule Commonly known as: Neurontin  Take 1 capsule (100 mg total) by mouth 3 (three) times daily.   ipratropium-albuterol  0.5-2.5 (3) MG/3ML Soln Commonly known as: DUONEB Take 3 mLs by nebulization every 4 (four) hours as needed (wheezing / shortness of breath).   levocetirizine 5 MG tablet Commonly known as: XYZAL Take 5 mg by mouth daily.   lidocaine -prilocaine  cream Commonly known as: EMLA  Apply 1 Application topically as needed. Apply to port and cover with saran wrap 1-2 hours prior to port access   magic mouthwash (lidocaine , diphenhydrAMINE , alum & mag hydroxide) suspension Swish and swallow 5 mLs 4 (four) times daily as needed for mouth pain.   metFORMIN  1000 MG tablet Commonly known as: GLUCOPHAGE  Take 1,000 mg by mouth 2 (two) times daily.   metoprolol  tartrate 25 MG tablet Commonly known as: LOPRESSOR  Take 1 tablet (25 mg total) by mouth 2 (two) times daily.   morphine  15 MG 12 hr tablet Commonly known as: MS CONTIN  Take 1 tablet (15 mg  total) by mouth every 12 (twelve) hours.   naloxone  4 MG/0.1ML Liqd nasal spray kit Commonly known as: NARCAN  SPRAY 1 SPRAY INTO ONE NOSTRIL AS DIRECTED FOR OPIOID OVERDOSE (TURN PERSON ON SIDE AFTER DOSE. IF NO RESPONSE IN 2-3 MINUTES OR PERSON RESPONDS BUT RELAPSES, REPEAT USING A NEW SPRAY DEVICE AND SPRAY INTO THE OTHER NOSTRIL. CALL 911 AFTER USE.) * EMERGENCY USE ONLY *   ondansetron  4 MG disintegrating tablet Commonly known as: ZOFRAN -ODT Take 1 tablet (4 mg total) by mouth every 8 (eight) hours as needed for nausea or vomiting.   oxyCODONE  5 MG immediate release tablet Commonly known as: Oxy IR/ROXICODONE  Take 1 tablet (5 mg total) by mouth every 4 (four) hours as needed for moderate pain (pain score 4-6) or severe pain (pain score 7-10).   prochlorperazine  10 MG tablet Commonly known as: COMPAZINE  Take 1 tablet (10 mg total) by mouth every 6 (six) hours as needed for nausea or vomiting.   rOPINIRole  2 MG tablet Commonly known as: REQUIP  Take 2 mg by mouth in the morning and  at bedtime.   spironolactone 50 MG tablet Commonly known as: ALDACTONE Take 1 tablet (50 mg total) by mouth daily. Start taking on: February 01, 2024   traZODone  50 MG tablet Commonly known as: DESYREL  Take 0.5-1 tablets (25-50 mg total) by mouth at bedtime as needed for sleep.   Ventolin  HFA 108 (90 Base) MCG/ACT inhaler Generic drug: albuterol  Inhale 2 puffs into the lungs every 4 (four) hours as needed for wheezing or shortness of breath.        Follow-up Information     Chucky Craver, FNP. Schedule an appointment as soon as possible for a visit in 1 week(s).   Specialties: Nurse Practitioner, Family Medicine Contact information: [address] [place] [address] [phone]                Discharge Exam: Cleavon Curls Weights   01/30/24 0957 01/31/24 0500  Weight: 128.8 kg 128.4 kg   General.  Morbidly obese lady, in no acute distress. Pulmonary.  Lungs clear bilaterally, normal  respiratory effort. CV.  Regular rate and rhythm, no JVD, rub or murmur. Abdomen.  Soft, nontender, nondistended, BS positive. CNS.  Alert and oriented .  No focal neurologic deficit. Extremities.  Trace LE edema,  pulses intact and symmetrical. Psychiatry.  Judgment and insight appears normal.   Condition at discharge: stable  The results of significant diagnostics from this hospitalization (including imaging, microbiology, ancillary and laboratory) are listed below for reference.   Imaging Studies: DG Chest Portable 1 View Result Date: 01/30/2024 CLINICAL DATA:  sob EXAM: PORTABLE CHEST 1 VIEW COMPARISON:  Dec 25, 2023 FINDINGS: The cardiomediastinal silhouette is unchanged and enlarged in contour.RIGHT chest port with tip terminating over the superior cavoatrial junction. No pleural effusion. No pneumothorax. Known cavitary LEFT-sided pulmonary nodule is not visualized radiographically. No acute pleuroparenchymal abnormality. IMPRESSION: 1. No acute cardiopulmonary abnormality. 2. Known cavitary LEFT-sided pulmonary nodule is not visualized radiographically. Electronically Signed   By: Clancy Crimes M.D.   On: 01/30/2024 10:28    Microbiology: Results for orders placed or performed during the hospital encounter of 12/25/23  Culture, blood (Routine x 2)     Status: None   Collection Time: 12/25/23  9:45 AM   Specimen: BLOOD  Result Value Ref Range Status   Specimen Description BLOOD RIGHT ANTECUBITAL  Final   Special Requests   Final    BOTTLES DRAWN AEROBIC AND ANAEROBIC Blood Culture adequate volume   Culture   Final    NO GROWTH 5 DAYS Performed at [facility], [address] [place], [place] [address]    Report Status 12/30/2023 FINAL  Final  Culture, blood (Routine x 2)     Status: None   Collection Time: 12/25/23  9:45 AM   Specimen: BLOOD  Result Value Ref Range Status   Specimen Description BLOOD BLOOD LEFT HAND  Final   Special Requests   Final     BOTTLES DRAWN AEROBIC AND ANAEROBIC Blood Culture results may not be optimal due to an inadequate volume of blood received in culture bottles   Culture   Final    NO GROWTH 5 DAYS Performed at [facility], [address], [place], [place] [address]    Report Status 12/30/2023 FINAL  Final  Resp panel by RT-PCR (RSV, Flu A&B, Covid) Anterior Nasal Swab     Status: None   Collection Time: 12/25/23  9:46 AM   Specimen: Anterior Nasal Swab  Result Value Ref Range Status  SARS Coronavirus 2 by RT PCR NEGATIVE NEGATIVE Final    Comment: (NOTE) SARS-CoV-2 target nucleic acids are NOT DETECTED.  The SARS-CoV-2 RNA is generally detectable in upper respiratory specimens during the acute phase of infection. The lowest concentration of SARS-CoV-2 viral copies this assay can detect is 138 copies/mL. A negative result does not preclude SARS-Cov-2 infection and should not be used as the sole basis for treatment or other patient management decisions. A negative result may occur with  improper specimen collection/handling, submission of specimen other than nasopharyngeal swab, presence of viral mutation(s) within the areas targeted by this assay, and inadequate number of viral copies(<138 copies/mL). A negative result must be combined with clinical observations, patient history, and epidemiological information. The expected result is Negative.  Fact Sheet for Patients:  BloggerCourse.com  Fact Sheet for Healthcare Providers:  SeriousBroker.it  This test is no t yet approved or cleared by the [place]  FDA and  has been authorized for detection and/or diagnosis of SARS-CoV-2 by FDA under an Emergency Use Authorization (EUA). This EUA will remain  in effect (meaning this test can be used) for the duration of the COVID-19 declaration under Section 564(b)(1) of the Act, 21 U.S.C.section 360bbb-3(b)(1), unless the authorization is  terminated  or revoked sooner.       Influenza A by PCR NEGATIVE NEGATIVE Final   Influenza B by PCR NEGATIVE NEGATIVE Final    Comment: (NOTE) The Xpert Xpress SARS-CoV-2/FLU/RSV plus assay is intended as an aid in the diagnosis of influenza from Nasopharyngeal swab specimens and should not be used as a sole basis for treatment. Nasal washings and aspirates are unacceptable for Xpert Xpress SARS-CoV-2/FLU/RSV testing.  Fact Sheet for Patients: BloggerCourse.com  Fact Sheet for Healthcare Providers: SeriousBroker.it  This test is not yet approved or cleared by the [place]  FDA and has been authorized for detection and/or diagnosis of SARS-CoV-2 by FDA under an Emergency Use Authorization (EUA). This EUA will remain in effect (meaning this test can be used) for the duration of the COVID-19 declaration under Section 564(b)(1) of the Act, 21 U.S.C. section 360bbb-3(b)(1), unless the authorization is terminated or revoked.     Resp Syncytial Virus by PCR NEGATIVE NEGATIVE Final    Comment: (NOTE) Fact Sheet for Patients: BloggerCourse.com  Fact Sheet for Healthcare Providers: SeriousBroker.it  This test is not yet approved or cleared by the [place]  FDA and has been authorized for detection and/or diagnosis of SARS-CoV-2 by FDA under an Emergency Use Authorization (EUA). This EUA will remain in effect (meaning this test can be used) for the duration of the COVID-19 declaration under Section 564(b)(1) of the Act, 21 U.S.C. section 360bbb-3(b)(1), unless the authorization is terminated or revoked.  Performed at [facility], [address] [place], [place] [address]     Labs: CBC: Recent Labs  Lab 01/30/24 1036  WBC 4.3  NEUTROABS 3.2  HGB 8.8*  HCT 26.5*  MCV 94.0  PLT 130*   Basic Metabolic Panel: Recent Labs  Lab 01/30/24 1036  01/30/24 1159 01/31/24 0423  NA 137  --  138  K 2.9*  --  3.9  CL 100  --  102  CO2 27  --  29  GLUCOSE 156*  --  155*  BUN 7*  --  6*  CREATININE 0.92  --  0.87  CALCIUM  7.9*  --  8.0*  MG 1.8  --   --   PHOS  --  2.2*  --  Liver Function Tests: Recent Labs  Lab 01/30/24 1036  AST 12*  ALT 8  ALKPHOS 65  BILITOT 0.7  PROT 6.9  ALBUMIN  2.5*   CBG: Recent Labs  Lab 01/30/24 1949 01/30/24 2227 01/31/24 0757  GLUCAP 155* 127* 151*    Discharge time spent: greater than 30 minutes.  This record has been created using Conservation officer, historic buildings. Errors have been sought and corrected,but may not always be located. Such creation errors do not reflect on the standard of care.   Signed: Luna Salinas, MD [facility] 01/31/2024

## 2024-01-31 NOTE — Hospital Course (Addendum)
 Taken from H&P.  Kristina Cruz is a 63 y.o. female with medical history significant of PAF on Eliquis  status post recent cardioversion, stage V pancreatic cancer with liver metastasis on active chemotherapy, IIDM, morbid obesity, COPD and asthma, presented with multiple complaints including chest pain palpitations and increasing peripheral edema.  Patient has been compliant with her Lasix  and Eliquis .  Having intermittent transient chest pain lasted for few minutes, associated with some shortness of breath and palpitations.  On presentation borderline tachycardia otherwise stable vital.  Labs with hemoglobin of 8.8, potassium 2.9, albumin  2.5,Troponin 269> 270.  UA showed 3+ WBC 1+ RBC.  Chest x-ray negative for acute infiltrate.  Patient was given IV albumin  and started on IV diuresis.  BNP of 70. Concern of decompensated liver versus acute on chronic HFpEF. Urine cultures were sent and she was started on ceftriaxone  for concern of UTI.  6/15: Vital stable, troponin 193, BMP stable, iron studies with borderline low iron and more consistent with an of chronic disease.  Her chest pain and shortness of breath has been resolved.  Trace lower extremity edema.  Patient has an echocardiogram done last month with normal EF.  Her BNP was normal so likely not CHF exacerbation.  Troponin downtrending and no ongoing chest pain-troponin was mildly elevated likely due to demand ischemia.  Patient need to restart home Lasix , 50 mg of spironolactone was also added as her hypervolemia was likely due to liver disease and hypoalbuminemia.  She was given IV albumin .  She need to follow-up closely with her oncologist as she has pretty advanced pancreatic cancer which is most likely contributory.  We discontinued her home potassium supplement as started on spironolactone.  She need close monitoring of her BMP and if needed potassium supplement can be restarted.  Patient now appears at her baseline.  She was also given  4 more days of Keflex for concern of UTI.  Urine cultures are pending, her primary care provider can follow-up and make changes to the antibiotics if needed.  She will continue the rest of her home medications and follow-up with her providers closely for further management.  Patient with advanced pancreatic cancer with overall poor prognosis.

## 2024-01-31 NOTE — Care Management Important Message (Signed)
 Important Message  Patient Details  Name: Kristina Cruz MRN: [id number] Date of Birth: [birth year]   Important Message Given:   Yes      Zoe Hinds, RN 01/31/2024, 5:41 PM

## 2024-01-31 NOTE — Plan of Care (Signed)
   Problem: Education: Goal: Ability to describe self-care measures that may prevent or decrease complications (Diabetes Survival Skills Education) will improve Outcome: Progressing Goal: Individualized Educational Video(s) Outcome: Progressing   Problem: Coping: Goal: Ability to adjust to condition or change in health will improve Outcome: Progressing

## 2024-02-01 LAB — URINE CULTURE: Culture: 40000 — AB

## 2024-02-01 LAB — GLUCOSE, CAPILLARY: Glucose-Capillary: 196 mg/dL — ABNORMAL HIGH (ref 70–99)

## 2024-02-03 ENCOUNTER — Ambulatory Visit: Attending: Internal Medicine | Admitting: Internal Medicine

## 2024-02-03 NOTE — Progress Notes (Deleted)
  Cardiology Office Note:  .   Date:  02/03/2024  ID:  Kristina Cruz, DOB January 23, 1961, MRN [id number] PCP: Chucky Craver, FNP  [facility] Providers Cardiologist:  Belva Boyden, MD { Click to update primary MD,subspecialty MD or APP then REFRESH:1}    History of Present Illness: Kristina Cruz is a 63 y.o. female with history of persistent atrial fibrillation status post TEE/DCCV in 12/2023, hypertension, type II diabetes mellitus, stage IV pancreatic cancer, and asthma/COPD, who presents for follow-up of atrial fibrillation.  She did not follow-up with our office afterwards.  She was hospitalized last week at [facility] due to worsening lower extremity edema.  She was noted to have mild/flat troponin elevation at that time as well as chronic anemia, hypoalbuminemia, and hypokalemia.  She was diuresed and also received IV albumin .  She was also treated with antibiotics for suspected UTI.  ROS: See HPI  Studies Reviewed: Aaron Aas        TEE (12/28/2023): LVEF 55-60%.  Normal RV size and function.  No atrial thrombus.  No pericardial effusion.  Trivial mitral regurgitation.  Mild to moderate tricuspid regurgitation.  Tricuspid aortic valve.  TTE (12/26/2023): Technically difficult study.  Normal LV size with moderate LVH.  LVEF 55-60% with normal wall motion.  Indeterminate diastolic parameters.  Normal RV size with mildly reduced contraction.  Normal biatrial size.  No pericardial effusion.  No significant valvular abnormalities.  Risk Assessment/Calculations:   {Does this patient have ATRIAL FIBRILLATION?:[phone]} No BP recorded.  {Refresh Note OR Click here to enter BP  :1}***       Physical Exam:   VS:  LMP 01/04/2018    Wt Readings from Last 3 Encounters:  01/31/24 283 lb 1.1 oz (128.4 kg)  01/22/24 289 lb (131.1 kg)  01/08/24 287 lb (130.2 kg)    General:  NAD. Neck: No JVD or HJR. Lungs: Clear to auscultation bilaterally without wheezes or crackles. Heart: Regular rate  and rhythm without murmurs, rubs, or gallops. Abdomen: Soft, nontender, nondistended. Extremities: No lower extremity edema.  ASSESSMENT AND PLAN: .    ***    {Are you ordering a CV Procedure (e.g. stress test, cath, DCCV, TEE, etc)?   Press F2        :[id number]}  Dispo: ***  Signed, Sammy Crisp, MD

## 2024-02-04 ENCOUNTER — Other Ambulatory Visit: Payer: Self-pay

## 2024-02-04 ENCOUNTER — Encounter: Payer: Self-pay | Admitting: Oncology

## 2024-02-04 ENCOUNTER — Other Ambulatory Visit: Payer: Self-pay | Admitting: Oncology

## 2024-02-04 DIAGNOSIS — C259 Malignant neoplasm of pancreas, unspecified: Secondary | ICD-10-CM

## 2024-02-04 DIAGNOSIS — I4891 Unspecified atrial fibrillation: Secondary | ICD-10-CM

## 2024-02-05 ENCOUNTER — Telehealth: Payer: Self-pay | Admitting: Oncology

## 2024-02-05 ENCOUNTER — Inpatient Hospital Stay: Admitting: Hospice and Palliative Medicine

## 2024-02-05 ENCOUNTER — Inpatient Hospital Stay: Admitting: Oncology

## 2024-02-05 ENCOUNTER — Inpatient Hospital Stay

## 2024-02-05 NOTE — Telephone Encounter (Signed)
 Pt over slept and stated she wont be here. Wants the appts to be moved to 6/27. Dr.Yu can you update the IS dates please

## 2024-02-05 NOTE — Telephone Encounter (Signed)
 Dr. Wilhelmenia Harada recommends pt to see Palliative for goals of care discussion. Pt has been been scheduled to see Josh next week.

## 2024-02-08 ENCOUNTER — Telehealth: Payer: Self-pay | Admitting: Oncology

## 2024-02-08 ENCOUNTER — Inpatient Hospital Stay: Admitting: Hospice and Palliative Medicine

## 2024-02-08 NOTE — Telephone Encounter (Signed)
 Patient called to see when her next appointment for chemo is. She said no one called to reschedule.   Please call her at the number in the chart.

## 2024-02-08 NOTE — Telephone Encounter (Signed)
 Please schedule pt for lab/ MD/ Pall same day

## 2024-02-12 ENCOUNTER — Ambulatory Visit

## 2024-02-12 ENCOUNTER — Inpatient Hospital Stay

## 2024-02-12 ENCOUNTER — Ambulatory Visit: Admitting: Oncology

## 2024-02-12 ENCOUNTER — Other Ambulatory Visit

## 2024-02-15 ENCOUNTER — Inpatient Hospital Stay

## 2024-02-15 ENCOUNTER — Inpatient Hospital Stay (HOSPITAL_BASED_OUTPATIENT_CLINIC_OR_DEPARTMENT_OTHER): Admitting: Hospice and Palliative Medicine

## 2024-02-15 ENCOUNTER — Encounter: Payer: Self-pay | Admitting: Oncology

## 2024-02-15 ENCOUNTER — Inpatient Hospital Stay (HOSPITAL_BASED_OUTPATIENT_CLINIC_OR_DEPARTMENT_OTHER): Admitting: Oncology

## 2024-02-15 VITALS — BP 145/80 | HR 71 | Temp 96.6°F | Resp 18

## 2024-02-15 DIAGNOSIS — G620 Drug-induced polyneuropathy: Secondary | ICD-10-CM

## 2024-02-15 DIAGNOSIS — Z5111 Encounter for antineoplastic chemotherapy: Secondary | ICD-10-CM

## 2024-02-15 DIAGNOSIS — C259 Malignant neoplasm of pancreas, unspecified: Secondary | ICD-10-CM

## 2024-02-15 DIAGNOSIS — Z95828 Presence of other vascular implants and grafts: Secondary | ICD-10-CM

## 2024-02-15 DIAGNOSIS — Z515 Encounter for palliative care: Secondary | ICD-10-CM | POA: Diagnosis not present

## 2024-02-15 DIAGNOSIS — C787 Secondary malignant neoplasm of liver and intrahepatic bile duct: Secondary | ICD-10-CM | POA: Diagnosis not present

## 2024-02-15 DIAGNOSIS — R600 Localized edema: Secondary | ICD-10-CM

## 2024-02-15 DIAGNOSIS — I4891 Unspecified atrial fibrillation: Secondary | ICD-10-CM

## 2024-02-15 DIAGNOSIS — G893 Neoplasm related pain (acute) (chronic): Secondary | ICD-10-CM

## 2024-02-15 DIAGNOSIS — T451X5A Adverse effect of antineoplastic and immunosuppressive drugs, initial encounter: Secondary | ICD-10-CM

## 2024-02-15 DIAGNOSIS — S80821A Blister (nonthermal), right lower leg, initial encounter: Secondary | ICD-10-CM

## 2024-02-15 DIAGNOSIS — I879 Disorder of vein, unspecified: Secondary | ICD-10-CM

## 2024-02-15 DIAGNOSIS — S80822A Blister (nonthermal), left lower leg, initial encounter: Secondary | ICD-10-CM

## 2024-02-15 LAB — CMP (CANCER CENTER ONLY)
ALT: 20 U/L (ref 0–44)
AST: 23 U/L (ref 15–41)
Albumin: 3.4 g/dL — ABNORMAL LOW (ref 3.5–5.0)
Alkaline Phosphatase: 126 U/L (ref 38–126)
Anion gap: 8 (ref 5–15)
BUN: 9 mg/dL (ref 8–23)
CO2: 25 mmol/L (ref 22–32)
Calcium: 8.6 mg/dL — ABNORMAL LOW (ref 8.9–10.3)
Chloride: 102 mmol/L (ref 98–111)
Creatinine: 0.83 mg/dL (ref 0.44–1.00)
GFR, Estimated: >60 mL/min (ref >60)
Glucose, Bld: 160 mg/dL — ABNORMAL HIGH (ref 70–99)
Potassium: 3.7 mmol/L (ref 3.5–5.1)
Sodium: 135 mmol/L (ref 135–145)
Total Bilirubin: 0.6 mg/dL (ref 0.0–1.2)
Total Protein: 7.6 g/dL (ref 6.5–8.1)

## 2024-02-15 LAB — CBC WITH DIFFERENTIAL (CANCER CENTER ONLY)
Abs Immature Granulocytes: 0.04 10*3/uL (ref 0.00–0.07)
Basophils Absolute: 0.1 10*3/uL (ref 0.0–0.1)
Basophils Relative: 1 %
Eosinophils Absolute: 0.1 10*3/uL (ref 0.0–0.5)
Eosinophils Relative: 3 %
HCT: 31.2 % — ABNORMAL LOW (ref 36.0–46.0)
Hemoglobin: 10.3 g/dL — ABNORMAL LOW (ref 12.0–15.0)
Immature Granulocytes: 1 %
Lymphocytes Relative: 28 %
Lymphs Abs: 1.5 10*3/uL (ref 0.7–4.0)
MCH: 31.3 pg (ref 26.0–34.0)
MCHC: 33 g/dL (ref 30.0–36.0)
MCV: 94.8 fL (ref 80.0–100.0)
Monocytes Absolute: 0.8 10*3/uL (ref 0.1–1.0)
Monocytes Relative: 14 %
Neutro Abs: 3 10*3/uL (ref 1.7–7.7)
Neutrophils Relative %: 53 %
Platelet Count: 288 10*3/uL (ref 150–400)
RBC: 3.29 MIL/uL — ABNORMAL LOW (ref 3.87–5.11)
RDW: 19.4 % — ABNORMAL HIGH (ref 11.5–15.5)
WBC Count: 5.4 10*3/uL (ref 4.0–10.5)
nRBC: 0 % (ref 0.0–0.2)

## 2024-02-15 MED ORDER — HEPARIN SOD (PORK) LOCK FLUSH 100 UNIT/ML IV SOLN
500.0000 [IU] | Freq: Once | INTRAVENOUS | Status: AC
  Administered 2024-02-15: 500 [IU] via INTRAVENOUS
  Filled 2024-02-15: qty 5

## 2024-02-15 MED ORDER — GABAPENTIN 100 MG PO CAPS: 100.0000 mg | ORAL_CAPSULE | Freq: Three times a day (TID) | ORAL | 0 refills | Status: DC

## 2024-02-15 MED ORDER — SODIUM CHLORIDE 0.9% FLUSH
10.0000 mL | Freq: Once | INTRAVENOUS | Status: AC
  Administered 2024-02-15: 10 mL via INTRAVENOUS
  Filled 2024-02-15: qty 10

## 2024-02-15 NOTE — Assessment & Plan Note (Signed)
 continue OxyContin  10 mg every 12 hours oxycodone  5 mg every 4-6 hours as needed -  .

## 2024-02-15 NOTE — Progress Notes (Signed)
 Hematology/Oncology Progress note [phone] [phone]      CHIEF COMPLAINTS/PURPOSE OF CONSULTATION:  Stage IV pancreatic carcinoma with liver metastasis.  ASSESSMENT & PLAN:   Malignant neoplasm of pancreas [name]) Imaging findings and different lesion biopsy results were reviewed and discussed with patient. 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  Other differential diagnosis include GI tract adenocarcinoma, cholangiocarcinoma etc. CEA is elevated.  His CA 19-9 is normal. CT and MRI imaging is did not show any GI primary.I recommend a PET scan for further evaluation- patient rescheduled appt initially and later on PET scan was not done due to admission. I offered to call her daughter to give her updates and patient declines.   Labs are reviewed and discussed with patient. Patient did not tolerate Gemciabine and Abraxane  well.  She understands that with her performance status, multiple medical conditions, she is at risk of developing treatment related complications. She strongly desires to continue cancer treatments.  Discontinue Abraxane , shared decision was made to proceed with dose reduced Gemcitabine  900mg /m2.  Recommend patient continue follow-up with palliative care service.    Atrial fibrillation with RVR (HCC) Atrial flutter: with RVR. S/p TEE & cardioverison on 12/28/23  follow-up with cardiology.  Continue metoprolol  and Eliquis   Chemotherapy-induced neuropathy (HCC) Recommend gabapentin  100mg  TID PRN.    Encounter for antineoplastic chemotherapy Chemotherapy plan as listed above  Leg edema Likely secondary to chronically insufficiency/CHF.   Refer to wound care.  Refer to vascular surgery.  Neoplasm related pain continue OxyContin  10 mg every 12 hours oxycodone  5 mg every 4-6 hours as needed -  .     Orders Placed This Encounter  Procedures   Cancer antigen 19-9    Standing Status:    Future    Expected Date:   03/03/2024    Expiration Date:   03/03/2025   CEA    Standing Status:   Future    Expected Date:   03/03/2024    Expiration Date:   03/03/2025   CBC with Differential (Cancer Center Only)    Standing Status:   Future    Expected Date:   03/03/2024    Expiration Date:   03/03/2025   CMP (Cancer Center only)    Standing Status:   Future    Expected Date:   03/03/2024    Expiration Date:   03/03/2025   CBC with Differential (Cancer Center Only)    Standing Status:   Future    Expected Date:   03/10/2024    Expiration Date:   03/10/2025   CMP (Cancer Center only)    Standing Status:   Future    Expected Date:   03/10/2024    Expiration Date:   03/10/2025   Ambulatory referral to Vascular Surgery    Referral Priority:   Routine    Referral Type:   Surgical    Referral Reason:   Specialty Services Required    Requested Specialty:   Vascular Surgery    Number of Visits Requested:   1   AMB referral to wound care center    Referral Priority:   Routine    Referral Type:   Consultation    Number of Visits Requested:   1   Follow-up 2 weeks All questions were answered. The patient knows to call the clinic with any problems, questions or concerns.  [name], MD, PhD [facility] Hematology Oncology 02/15/2024    HISTORY OF PRESENTING ILLNESS:  Kristina  Cruz 63 y.o. female presents to establish care for Stage IV pancreatic cancer I have reviewed her chart and materials related to her cancer extensively and collaborated history with the patient. Summary of oncologic history is as follows: Oncology History  Malignant neoplasm of pancreas (HCC)  08/21/2023 Imaging   CT abdomen pelvis with contrast showed 1. Masslike thickening involving the proximal and mid tail of pancreas with relative hypoenhancement measures approximately 3.9 x 5.5 cm. In the absence of signs/symptoms of acute pancreatitis underlying neoplastic process cannot be excluded. Further  evaluation with nonemergent contrast enhanced MRI of the abdomen is advised. Note: Given patient's body habitus and the motion artifact observed on the current exam an MRI obtained at this time is likely to be severely limited and likely nondiagnostic. MRI should be obtained only once the patient is clinically stable, and is able to remain motionless and breath hold. 2. Patchy ground-glass and airspace densities identified within both lower lobes. These are new compared with the previous exam and are concerning for underlying inflammatory/infectious process. 3. Sigmoid diverticulosis without signs of acute diverticulitis. 4. Marked diastasis recti with ventral herniation of the large and small bowel loops. 5. Periumbilical hernia is containing fat only.   08/24/2023 Imaging   MRI abdomen wo contrast  1. Moderately suboptimal unenhanced exam. 2. Heterogeneous masslike thickening of the pancreatic body/tail again seen. The lesion remains indeterminate on this exam. Differential diagnosis includes pancreatic tumor, autoimmune pancreatitis, mass forming chronic pancreatitis, etc. Please see above for follow-up recommendations. 3. Mild diffuse hepatic steatosis. There are indeterminate areas in the liver, which can also be better evaluated on the contrast-enhanced MRI abdomen. 4. Other observations, as described above.   11/05/2023 Initial Diagnosis   Pancreatic cancer metastasized to liver  Patient was hospitalized from 11/05/2023 - 11/10/2023 She presented to emergency room due to nausea, vomiting and abdominal pain. CT scan showed multiple hypodense hepatic lesions increasing number and size from prior imaging.  Pancreatic tail mass, hypodense lesion in the anterior spleen.  11/09/2023 ultrasound-guided biopsy was obtained. Pathology showed moderately to poorly differentiated adenocarcinoma with extensive areas of tumor necrosis. The adenocarcinoma is positive for cytokeratin 7 and CDX2  (focal).     Cytokeratin 20, TTF-1, and GATA3 are negative. The pattern of immunoreactivity is not specific and the differential diagnosis includes metastatic adenocarcinoma from the pancreaticobiliary tract and upper GI. Intrahepatic       cholangiocarcinoma is also a diagnostic possibility. Given the radiographic  findings a pancreaticobiliary primary is favored and correlation with clinical impression is required.    11/17/2023 Cancer Staging   Staging form: Exocrine Pancreas, AJCC 8th Edition - Clinical stage from 11/17/2023: Stage IV (cT3, cNX, pM1) - Signed by Babara Call, MD on 11/17/2023 Stage prefix: Initial diagnosis    Imaging   CT abdomen pelvis w contrast  1. Multiple hypodense hepatic lesions, increased in number and size from prior imaging, highly suspicious for metastatic disease. 2. Hypodense lesion in the pancreatic tail measures 7.2 x 3.9 cm, increased in size from prior. The increase in size favors pancreatic neoplasm, with additional etiologies less likely. 3. New hypodense lesion in the anterior spleen, 13 mm, suspicious for metastatic disease. 4. Laxity of the anterior abdominal wall with midline ventral abdominal wall hernia containing small and to a lesser extent large bowel. No bowel obstruction or inflammation. 5. Colonic diverticulosis without diverticulitis.   Aortic Atherosclerosis (ICD10-I70.0)    11/27/2023 -  Chemotherapy   Patient is on Treatment Plan : PANCREATIC Abraxane   D1,8,15 + Gemcitabine  D1,8,15 q28d      Hospitalization due to Atrial flutter: with RVR. S/p TEE & cardioverison on 12/28/23, severe sepsis, gastroenteritis.  01/30/2024 - 01/31/2024 patient was hospitalized due to CHF exacerbation, she also received antibiotics for E. coli UTI.  Today patient denies any nausea vomiting diarrhea.  No fever or chills.  She feels better.   chronic bilateral lower extremity edema.  Patient noticed worse of chronic lower extremity edema, skin breakdown. She walks  around with her walker at home. She feels her mobility has improved. 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 Patient states that her daughter is aware about her diagnosis and the cancer treatments.   MEDICAL HISTORY:  Past Medical History:  Diagnosis Date   Anemia    Asthma    Back pain    Cataract    Diabetes mellitus without complication (HCC)    GERD (gastroesophageal reflux disease)    History of echocardiogram    Hypertension    Morbid obesity with BMI of 60.0-69.9, adult (HCC)    Restless leg syndrome     SURGICAL HISTORY: Past Surgical History:  Procedure Laterality Date   ABDOMINAL HYSTERECTOMY     CARDIOVERSION N/A 12/28/2023   Procedure: CARDIOVERSION;  Surgeon: Darliss Rogue, MD;  Location: [facility] ORS;  Service: Cardiovascular;  Laterality: N/A;   CATARACT EXTRACTION W/PHACO Left 04/13/2020   Procedure: CATARACT EXTRACTION PHACO AND INTRAOCULAR LENS PLACEMENT (IOC);  Surgeon: Ferol Rogue, MD;  Location: [facility] ORS;  Service: Ophthalmology;  Laterality: Left;  US  00:36.0 CDE 5.95 Fluid Pack lot # [id number]   HYSTEROSCOPY WITH D & C N/A 07/14/2017   Procedure: DILATATION AND CURETTAGE /HYSTEROSCOPY;  Surgeon: Arloa Lamar SQUIBB, MD;  Location: [facility] ORS;  Service: Gynecology;  Laterality: N/A;   IR IMAGING GUIDED PORT INSERTION  12/17/2023   POLYPECTOMY  2015   TEE WITHOUT CARDIOVERSION N/A 12/28/2023   Procedure: ECHOCARDIOGRAM, TRANSESOPHAGEAL;  Surgeon: Darliss Rogue, MD;  Location: [facility] ORS;  Service: Cardiovascular;  Laterality: N/A;    SOCIAL HISTORY: Social History   Socioeconomic History   Marital status: Single    Spouse name: Not on file   Number of children: Not on file   Years of education: Not on file   Highest education level: Not on file  Occupational History   Not on file  Tobacco Use   Smoking status: Never   Smokeless tobacco: Never  Vaping Use   Vaping status: Never Used  Substance and  Sexual Activity   Alcohol use: No   Drug use: No   Sexual activity: Never    Birth control/protection: None  Other Topics Concern   Not on file  Social History Narrative   Not on file   Social Drivers of Health   Financial Resource Strain: Low Risk  (09/26/2022)   Received from [facility]   Overall Financial Resource Strain (CARDIA)    Difficulty of Paying Living Expenses: Not hard at all  Food Insecurity: Patient Declined (01/30/2024)   Hunger Vital Sign    Worried About Running Out of Food in the Last Year: Patient declined    Ran Out of Food in the Last Year: Patient declined  Transportation Needs: Patient Declined (01/30/2024)   PRAPARE - Administrator, Civil Service (Medical): Patient declined    Lack of Transportation (Non-Medical): Patient declined  Physical Activity: Not on file  Stress: Not on file  Social Connections: Socially Isolated (12/25/2023)   Social Connection and Isolation Panel    Frequency of Communication with Friends and Family: More than three times a week    Frequency of Social Gatherings with Friends and Family: More than three times a week    Attends Religious Services: Never    Database administrator or Organizations: No    Attends Banker Meetings: Never    Marital Status: Never married  Intimate Partner Violence: Patient Declined (01/30/2024)   Humiliation, Afraid, Rape, and Kick questionnaire    Fear of Current or Ex-Partner: Patient declined    Emotionally Abused: Patient declined    Physically Abused: Patient declined    Sexually Abused: Patient declined    FAMILY HISTORY: Family History  Problem Relation Age of Onset   Breast cancer Mother 7   Diabetes Mother    Hypertension Mother    Ovarian cancer Paternal Aunt        ?   Diabetes Father    Hypertension Father     ALLERGIES:  is allergic to peanut-containing drug products.  MEDICATIONS:  Current Outpatient Medications  Medication Sig  Dispense Refill   acetaminophen  (TYLENOL  8 HOUR ARTHRITIS PAIN) 650 MG CR tablet Take 3 tablets (1,950 mg total) by mouth every 8 (eight) hours as needed for pain. Do not exceed 4000 mg total in a day.  Home med.     apixaban  (ELIQUIS ) 5 MG TABS tablet Take 1 tablet (5 mg total) by mouth 2 (two) times daily. 60 tablet 0   azelastine  (ASTELIN ) 0.1 % nasal spray Place 1 spray into both nostrils 2 (two) times daily.     baclofen  (LIORESAL ) 10 MG tablet Take 10 mg by mouth 3 (three) times daily.     diphenoxylate -atropine  (LOMOTIL ) 2.5-0.025 MG tablet Take 2 tablets by mouth every 6 (six) hours as needed (for diarrhea unrelieved by imodium). 90 tablet 0   EPINEPHrine  0.3 mg/0.3 mL IJ SOAJ injection Inject 0.3 mg into the muscle as needed for anaphylaxis. 1 each 0   fluticasone  (FLONASE ) 50 MCG/ACT nasal spray Place 1 spray into both nostrils daily. 9.9 mL 0   fluticasone  furoate-vilanterol (BREO ELLIPTA ) 100-25 MCG/ACT AEPB Inhale 1 puff into the lungs daily. 28 each 1   furosemide  (LASIX ) 40 MG tablet Take 40 mg by mouth daily.     ipratropium-albuterol  (DUONEB) 0.5-2.5 (3) MG/3ML SOLN Take 3 mLs by nebulization every 4 (four) hours as needed (wheezing / shortness of breath). 360 mL 1   levocetirizine (XYZAL ) 5 MG tablet Take 5 mg by mouth daily.     lidocaine -prilocaine  (EMLA ) cream Apply 1 Application topically as needed. Apply to port and cover with saran wrap 1-2 hours prior to port access 30 g 1   magic mouthwash (lidocaine , diphenhydrAMINE , alum & mag hydroxide) suspension Swish and swallow 5 mLs 4 (four) times daily as needed for mouth pain. 360 mL 1   metFORMIN  (GLUCOPHAGE ) 1000 MG tablet Take 1,000 mg by mouth 2 (two) times daily.     metoprolol  tartrate (LOPRESSOR ) 25 MG tablet Take 1 tablet (25 mg total) by mouth 2 (two) times daily. 60 tablet 0   morphine  (MS CONTIN ) 15 MG 12 hr tablet Take 1 tablet (15 mg total) by mouth every 12 (twelve) hours. 30 tablet 0   naloxone  (NARCAN ) nasal spray  4 mg/0.1 mL SPRAY 1 SPRAY INTO ONE NOSTRIL AS DIRECTED FOR OPIOID OVERDOSE (TURN PERSON ON SIDE AFTER DOSE. IF NO RESPONSE IN 2-3 MINUTES  OR PERSON RESPONDS BUT RELAPSES, REPEAT USING A NEW SPRAY DEVICE AND SPRAY INTO THE OTHER NOSTRIL. CALL 911 AFTER USE.) * EMERGENCY USE ONLY * 1 each 0   ondansetron  (ZOFRAN -ODT) 4 MG disintegrating tablet Take 1 tablet (4 mg total) by mouth every 8 (eight) hours as needed for nausea or vomiting. 45 tablet 0   oxyCODONE  (OXY IR/ROXICODONE ) 5 MG immediate release tablet Take 1 tablet (5 mg total) by mouth every 4 (four) hours as needed for moderate pain (pain score 4-6) or severe pain (pain score 7-10). 90 tablet 0   prochlorperazine  (COMPAZINE ) 10 MG tablet Take 1 tablet (10 mg total) by mouth every 6 (six) hours as needed for nausea or vomiting. 30 tablet 1   rOPINIRole  (REQUIP ) 2 MG tablet Take 2 mg by mouth in the morning and at bedtime.      spironolactone  (ALDACTONE ) 50 MG tablet Take 1 tablet (50 mg total) by mouth daily. 30 tablet 1   traZODone  (DESYREL ) 50 MG tablet Take 0.5-1 tablets (25-50 mg total) by mouth at bedtime as needed for sleep. 30 tablet 2   VENTOLIN  HFA 108 (90 Base) MCG/ACT inhaler Inhale 2 puffs into the lungs every 4 (four) hours as needed for wheezing or shortness of breath. 18 g 1   gabapentin  (NEURONTIN ) 100 MG capsule Take 1 capsule (100 mg total) by mouth 3 (three) times daily. 60 capsule 0   No current facility-administered medications for this visit.    Review of Systems  Constitutional:  Positive for fatigue. Negative for appetite change, chills and fever.  HENT:   Negative for hearing loss and voice change.   Eyes:  Negative for eye problems.  Respiratory:  Negative for chest tightness, cough and shortness of breath.   Cardiovascular:  Positive for leg swelling. Negative for chest pain.  Gastrointestinal:  Positive for nausea. Negative for abdominal distention, abdominal pain and blood in stool.  Endocrine: Negative for hot  flashes.  Genitourinary:  Negative for difficulty urinating and frequency.   Musculoskeletal:  Negative for arthralgias.  Skin:  Negative for itching and rash.  Neurological:  Negative for extremity weakness.  Hematological:  Negative for adenopathy.  Psychiatric/Behavioral:  Negative for confusion.      PHYSICAL EXAMINATION: ECOG PERFORMANCE STATUS: 1 - Symptomatic but completely ambulatory  Vitals:   02/15/24 0916  BP: (!) 145/80  Pulse: 71  Resp: 18  Temp: (!) 96.6 F (35.9 C)  SpO2: 98%   There were no vitals filed for this visit.   Physical Exam Constitutional:      General: She is not in acute distress.    Appearance: She is obese. She is not diaphoretic.  HENT:     Head: Normocephalic and atraumatic.   Eyes:     General: No scleral icterus.   Cardiovascular:     Rate and Rhythm: Normal rate and regular rhythm.  Pulmonary:     Effort: Pulmonary effort is normal. No respiratory distress.     Breath sounds: No wheezing or rales.     Comments: Decreased breath sound bilaterally Abdominal:     General: Bowel sounds are normal. There is no distension.     Palpations: Abdomen is soft.   Musculoskeletal:        General: Swelling present. Normal range of motion.     Cervical back: Normal range of motion and neck supple.     Right lower leg: Edema present.     Left lower leg: Edema present.   Skin:  General: Skin is warm and dry.     Findings: No erythema.   Neurological:     Mental Status: She is alert and oriented to person, place, and time. Mental status is at baseline.     Motor: No abnormal muscle tone.   Psychiatric:        Mood and Affect: Mood and affect normal.      LABORATORY DATA:  I have reviewed the data as listed    Latest Ref Rng & Units 02/15/2024    8:55 AM 01/30/2024   10:36 AM 01/22/2024    8:37 AM  CBC  WBC 4.0 - 10.5 K/uL 5.4  4.3  6.8   Hemoglobin 12.0 - 15.0 g/dL 89.6  8.8  9.9   Hematocrit 36.0 - 46.0 % 31.2  26.5  31.4    Platelets 150 - 400 K/uL 288  130  236       Latest Ref Rng & Units 02/15/2024    8:55 AM 01/31/2024    4:23 AM 01/30/2024   10:36 AM  CMP  Glucose 70 - 99 mg/dL 839  844  843   BUN 8 - 23 mg/dL 9  6  7    Creatinine 0.44 - 1.00 mg/dL 9.16  9.12  9.07   Sodium 135 - 145 mmol/L 135  138  137   Potassium 3.5 - 5.1 mmol/L 3.7  3.9  2.9   Chloride 98 - 111 mmol/L 102  102  100   CO2 22 - 32 mmol/L 25  29  27    Calcium  8.9 - 10.3 mg/dL 8.6  8.0  7.9   Total Protein 6.5 - 8.1 g/dL 7.6   6.9   Total Bilirubin 0.0 - 1.2 mg/dL 0.6   0.7   Alkaline Phos 38 - 126 U/L 126   65   AST 15 - 41 U/L 23   12   ALT 0 - 44 U/L 20   8      RADIOGRAPHIC STUDIES: I have personally reviewed the radiological images as listed and agreed with the findings in the report. DG Chest Portable 1 View Result Date: 01/30/2024 CLINICAL DATA:  sob EXAM: PORTABLE CHEST 1 VIEW COMPARISON:  Dec 25, 2023 FINDINGS: The cardiomediastinal silhouette is unchanged and enlarged in contour.RIGHT chest port with tip terminating over the superior cavoatrial junction. No pleural effusion. No pneumothorax. Known cavitary LEFT-sided pulmonary nodule is not visualized radiographically. No acute pleuroparenchymal abnormality. IMPRESSION: 1. No acute cardiopulmonary abnormality. 2. Known cavitary LEFT-sided pulmonary nodule is not visualized radiographically. Electronically Signed   By: Corean Salter M.D.   On: 01/30/2024 10:28

## 2024-02-15 NOTE — Progress Notes (Signed)
 Palliative Medicine [facility] at [facility] Telephone:([phone]) [phone] Fax:(336) [phone]   Name: Kristina Cruz Date: 02/15/2024 MRN: [id number]  DOB: 10-21-1960  Patient Care Team: Debarah [name], FNP as PCP - General (Nurse Practitioner) Perla Evalene PARAS, MD as PCP - Cardiology (Cardiology) Babara Call, MD as Consulting Physician (Oncology) Maurie Rayfield BIRCH, RN as Oncology Nurse Navigator    REASON FOR CONSULTATION: Kristina Cruz is a 63 y.o. 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 Palliative care consulted to address goals and manage ongoing symptoms.    SOCIAL HISTORY:     reports that she has never smoked. She has never used smokeless tobacco. She reports that she does not drink alcohol and does not use drugs.  Patient never married.  Lives at home alone.  She has a daughter who is involved.  Patient also has caregivers for 20 hours weekly.  Patient previously worked as a Lawyer and in a Futures trader.  ADVANCE DIRECTIVES:  Does not have  CODE STATUS:   PAST MEDICAL HISTORY: Past Medical History:  Diagnosis Date   Anemia    Asthma    Back pain    Cataract    Diabetes mellitus without complication (HCC)    GERD (gastroesophageal reflux disease)    History of echocardiogram    Hypertension    Morbid obesity with BMI of 60.0-69.9, adult (HCC)    Restless leg syndrome     PAST SURGICAL HISTORY:  Past Surgical History:  Procedure Laterality Date   ABDOMINAL HYSTERECTOMY     CARDIOVERSION N/A 12/28/2023   Procedure: CARDIOVERSION;  Surgeon: Darliss Rogue, MD;  Location: [facility] ORS;  Service: Cardiovascular;  Laterality: N/A;   CATARACT EXTRACTION W/PHACO Left 04/13/2020   Procedure: CATARACT EXTRACTION PHACO AND INTRAOCULAR LENS PLACEMENT (IOC);  Surgeon: Ferol Rogue, MD;  Location: [facility] ORS;  Service: Ophthalmology;   Laterality: Left;  US  00:36.0 CDE 5.95 Fluid Pack lot # [id number]   HYSTEROSCOPY WITH D & C N/A 07/14/2017   Procedure: DILATATION AND CURETTAGE /HYSTEROSCOPY;  Surgeon: Arloa Lamar SQUIBB, MD;  Location: [facility] ORS;  Service: Gynecology;  Laterality: N/A;   IR IMAGING GUIDED PORT INSERTION  12/17/2023   POLYPECTOMY  2015   TEE WITHOUT CARDIOVERSION N/A 12/28/2023   Procedure: ECHOCARDIOGRAM, TRANSESOPHAGEAL;  Surgeon: Darliss Rogue, MD;  Location: [facility] ORS;  Service: Cardiovascular;  Laterality: N/A;    HEMATOLOGY/ONCOLOGY HISTORY:  Oncology History  Malignant neoplasm of pancreas (HCC)  08/21/2023 Imaging   CT abdomen pelvis with contrast showed 1. Masslike thickening involving the proximal and mid tail of pancreas with relative hypoenhancement measures approximately 3.9 x 5.5 cm. In the absence of signs/symptoms of acute pancreatitis underlying neoplastic process cannot be excluded. Further evaluation with nonemergent contrast enhanced MRI of the abdomen is advised. Note: Given patient's body habitus and the motion artifact observed on the current exam an MRI obtained at this time is likely to be severely limited and likely nondiagnostic. MRI should be obtained only once the patient is clinically stable, and is able to remain motionless and breath hold. 2. Patchy ground-glass and airspace densities identified within both lower lobes. These are new compared with the previous exam and are concerning for underlying inflammatory/infectious process. 3. Sigmoid diverticulosis without signs of acute diverticulitis. 4. Marked diastasis recti with ventral herniation of the large and small bowel loops. 5. Periumbilical hernia is containing fat only.   08/24/2023 Imaging  MRI abdomen wo contrast  1. Moderately suboptimal unenhanced exam. 2. Heterogeneous masslike thickening of the pancreatic body/tail again seen. The lesion remains indeterminate on this exam. Differential diagnosis includes  pancreatic tumor, autoimmune pancreatitis, mass forming chronic pancreatitis, etc. Please see above for follow-up recommendations. 3. Mild diffuse hepatic steatosis. There are indeterminate areas in the liver, which can also be better evaluated on the contrast-enhanced MRI abdomen. 4. Other observations, as described above.   11/05/2023 Initial Diagnosis   Pancreatic cancer metastasized to liver  Patient was hospitalized from 11/05/2023 - 11/10/2023 She presented to emergency room due to nausea, vomiting and abdominal pain. CT scan showed multiple hypodense hepatic lesions increasing number and size from prior imaging.  Pancreatic tail mass, hypodense lesion in the anterior spleen.  11/09/2023 ultrasound-guided biopsy was obtained. Pathology showed moderately to poorly differentiated adenocarcinoma with extensive areas of tumor necrosis. The adenocarcinoma is positive for cytokeratin 7 and CDX2 (focal).     Cytokeratin 20, TTF-1, and GATA3 are negative. The pattern of immunoreactivity is not specific and the differential diagnosis includes metastatic adenocarcinoma from the pancreaticobiliary tract and upper GI. Intrahepatic       cholangiocarcinoma is also a diagnostic possibility. Given the radiographic  findings a pancreaticobiliary primary is favored and correlation with clinical impression is required.    11/17/2023 Cancer Staging   Staging form: Exocrine Pancreas, AJCC 8th Edition - Clinical stage from 11/17/2023: Stage IV (cT3, cNX, pM1) - Signed by Babara Call, MD on 11/17/2023 Stage prefix: Initial diagnosis    Imaging   CT abdomen pelvis w contrast  1. Multiple hypodense hepatic lesions, increased in number and size from prior imaging, highly suspicious for metastatic disease. 2. Hypodense lesion in the pancreatic tail measures 7.2 x 3.9 cm, increased in size from prior. The increase in size favors pancreatic neoplasm, with additional etiologies less likely. 3. New hypodense lesion in the  anterior spleen, 13 mm, suspicious for metastatic disease. 4. Laxity of the anterior abdominal wall with midline ventral abdominal wall hernia containing small and to a lesser extent large bowel. No bowel obstruction or inflammation. 5. Colonic diverticulosis without diverticulitis.   Aortic Atherosclerosis (ICD10-I70.0)    11/27/2023 -  Chemotherapy   Patient is on Treatment Plan : PANCREATIC Abraxane  D1,8,15 + Gemcitabine  D1,8,15 q28d       ALLERGIES:  is allergic to peanut-containing drug products.  MEDICATIONS:  Current Outpatient Medications  Medication Sig Dispense Refill   acetaminophen  (TYLENOL  8 HOUR ARTHRITIS PAIN) 650 MG CR tablet Take 3 tablets (1,950 mg total) by mouth every 8 (eight) hours as needed for pain. Do not exceed 4000 mg total in a day.  Home med.     apixaban  (ELIQUIS ) 5 MG TABS tablet Take 1 tablet (5 mg total) by mouth 2 (two) times daily. 60 tablet 0   azelastine  (ASTELIN ) 0.1 % nasal spray Place 1 spray into both nostrils 2 (two) times daily.     baclofen  (LIORESAL ) 10 MG tablet Take 10 mg by mouth 3 (three) times daily.     diphenoxylate -atropine  (LOMOTIL ) 2.5-0.025 MG tablet Take 2 tablets by mouth every 6 (six) hours as needed (for diarrhea unrelieved by imodium). 90 tablet 0   EPINEPHrine  0.3 mg/0.3 mL IJ SOAJ injection Inject 0.3 mg into the muscle as needed for anaphylaxis. 1 each 0   fluticasone  (FLONASE ) 50 MCG/ACT nasal spray Place 1 spray into both nostrils daily. 9.9 mL 0   fluticasone  furoate-vilanterol (BREO ELLIPTA ) 100-25 MCG/ACT AEPB Inhale 1 puff into the  lungs daily. 28 each 1   furosemide  (LASIX ) 40 MG tablet Take 40 mg by mouth daily.     gabapentin  (NEURONTIN ) 100 MG capsule Take 1 capsule (100 mg total) by mouth 3 (three) times daily. 60 capsule 0   ipratropium-albuterol  (DUONEB) 0.5-2.5 (3) MG/3ML SOLN Take 3 mLs by nebulization every 4 (four) hours as needed (wheezing / shortness of breath). 360 mL 1   levocetirizine (XYZAL ) 5 MG  tablet Take 5 mg by mouth daily.     lidocaine -prilocaine  (EMLA ) cream Apply 1 Application topically as needed. Apply to port and cover with saran wrap 1-2 hours prior to port access 30 g 1   magic mouthwash (lidocaine , diphenhydrAMINE , alum & mag hydroxide) suspension Swish and swallow 5 mLs 4 (four) times daily as needed for mouth pain. 360 mL 1   metFORMIN  (GLUCOPHAGE ) 1000 MG tablet Take 1,000 mg by mouth 2 (two) times daily.     metoprolol  tartrate (LOPRESSOR ) 25 MG tablet Take 1 tablet (25 mg total) by mouth 2 (two) times daily. 60 tablet 0   morphine  (MS CONTIN ) 15 MG 12 hr tablet Take 1 tablet (15 mg total) by mouth every 12 (twelve) hours. 30 tablet 0   naloxone  (NARCAN ) nasal spray 4 mg/0.1 mL SPRAY 1 SPRAY INTO ONE NOSTRIL AS DIRECTED FOR OPIOID OVERDOSE (TURN PERSON ON SIDE AFTER DOSE. IF NO RESPONSE IN 2-3 MINUTES OR PERSON RESPONDS BUT RELAPSES, REPEAT USING A NEW SPRAY DEVICE AND SPRAY INTO THE OTHER NOSTRIL. CALL 911 AFTER USE.) * EMERGENCY USE ONLY * 1 each 0   ondansetron  (ZOFRAN -ODT) 4 MG disintegrating tablet Take 1 tablet (4 mg total) by mouth every 8 (eight) hours as needed for nausea or vomiting. 45 tablet 0   oxyCODONE  (OXY IR/ROXICODONE ) 5 MG immediate release tablet Take 1 tablet (5 mg total) by mouth every 4 (four) hours as needed for moderate pain (pain score 4-6) or severe pain (pain score 7-10). 90 tablet 0   prochlorperazine  (COMPAZINE ) 10 MG tablet Take 1 tablet (10 mg total) by mouth every 6 (six) hours as needed for nausea or vomiting. 30 tablet 1   rOPINIRole  (REQUIP ) 2 MG tablet Take 2 mg by mouth in the morning and at bedtime.      spironolactone  (ALDACTONE ) 50 MG tablet Take 1 tablet (50 mg total) by mouth daily. 30 tablet 1   traZODone  (DESYREL ) 50 MG tablet Take 0.5-1 tablets (25-50 mg total) by mouth at bedtime as needed for sleep. 30 tablet 2   VENTOLIN  HFA 108 (90 Base) MCG/ACT inhaler Inhale 2 puffs into the lungs every 4 (four) hours as needed for wheezing or  shortness of breath. 18 g 1   No current facility-administered medications for this visit.    VITAL SIGNS: LMP 01/04/2018  There were no vitals filed for this visit.  Estimated body mass index is 51.77 kg/m as calculated from the following:   Height as of 01/30/24: 5' 2 (1.575 m).   Weight as of 01/31/24: 283 lb 1.1 oz (128.4 kg).  LABS: CBC:    Component Value Date/Time   WBC 5.4 02/15/2024 0855   WBC 4.3 01/30/2024 1036   HGB 10.3 (L) 02/15/2024 0855   HGB 12.0 04/10/2014 1001   HCT 31.2 (L) 02/15/2024 0855   HCT 38.2 04/10/2014 1001   PLT 288 02/15/2024 0855   PLT 421 04/10/2014 1001   MCV 94.8 02/15/2024 0855   MCV 83 04/10/2014 1001   NEUTROABS 3.0 02/15/2024 0855   NEUTROABS 6.3  03/20/2014 0707   LYMPHSABS 1.5 02/15/2024 0855   LYMPHSABS 0.6 (L) 03/20/2014 0707   MONOABS 0.8 02/15/2024 0855   MONOABS 0.2 03/20/2014 0707   EOSABS 0.1 02/15/2024 0855   EOSABS 0.1 03/20/2014 0707   BASOSABS 0.1 02/15/2024 0855   BASOSABS 0.0 03/20/2014 0707   Comprehensive Metabolic Panel:    Component Value Date/Time   NA 135 02/15/2024 0855   NA 143 04/10/2014 1001   K 3.7 02/15/2024 0855   K 3.4 (L) 04/10/2014 1001   CL 102 02/15/2024 0855   CL 108 (H) 04/10/2014 1001   CO2 25 02/15/2024 0855   CO2 31 04/10/2014 1001   BUN 9 02/15/2024 0855   BUN 14 04/10/2014 1001   CREATININE 0.83 02/15/2024 0855   CREATININE 1.14 04/10/2014 1001   GLUCOSE 160 (H) 02/15/2024 0855   GLUCOSE 107 (H) 04/10/2014 1001   CALCIUM  8.6 (L) 02/15/2024 0855   CALCIUM  8.8 04/10/2014 1001   AST 23 02/15/2024 0855   ALT 20 02/15/2024 0855   ALT 14 04/10/2014 1001   ALKPHOS 126 02/15/2024 0855   ALKPHOS 82 04/10/2014 1001   BILITOT 0.6 02/15/2024 0855   PROT 7.6 02/15/2024 0855   PROT 7.5 04/10/2014 1001   ALBUMIN  3.4 (L) 02/15/2024 0855   ALBUMIN  3.1 (L) 04/10/2014 1001    RADIOGRAPHIC STUDIES: DG Chest Portable 1 View Result Date: 01/30/2024 CLINICAL DATA:  sob EXAM: PORTABLE CHEST 1  VIEW COMPARISON:  Dec 25, 2023 FINDINGS: The cardiomediastinal silhouette is unchanged and enlarged in contour.RIGHT chest port with tip terminating over the superior cavoatrial junction. No pleural effusion. No pneumothorax. Known cavitary LEFT-sided pulmonary nodule is not visualized radiographically. No acute pleuroparenchymal abnormality. IMPRESSION: 1. No acute cardiopulmonary abnormality. 2. Known cavitary LEFT-sided pulmonary nodule is not visualized radiographically. Electronically Signed   By: Corean Salter M.D.   On: 01/30/2024 10:28    PERFORMANCE STATUS (ECOG) : 2 - Symptomatic, <50% confined to bed  Review of Systems Unless otherwise noted, a complete review of systems is negative.  Physical Exam General: NAD Cardiovascular: regular rate and rhythm Pulmonary: clear ant fields Abdomen: soft, nontender, + bowel sounds GU: no suprapubic tenderness Extremities: no edema, no joint deformities Skin: no rashes Neurological: Weakness but otherwise nonfocal  IMPRESSION: Follow-up visit.  Patient was hospitalized 01/30/2024 to 01/31/2024 with CHF.  Patient states that she is now home and back to baseline.  She denies any significant symptomatic complaints or concerns today.  Patient does endorse ongoing neuropathy.  She says that the gabapentin  is helping and requests a refill of that today.  Patient says that she is only taking the gabapentin  twice daily and we discussed that she could take it 3 times daily.  Will refill.  Patient says she is in process of moving apartments.  She is going to have a roommate and continues to have need services in the home.  Patient says she is talking with her family and exploring option of donating her body after death to a [facility] anatomic gift [facility].  She asked for me to provide her information for anatomical gift program at [facility].  I did give her a number and email to contact university directly.   PLAN: - Continue current scope  of treatment - Continue MS Contin /oxycodone /gabapentin  - Refill gabapentin  - Daily bowel regimen - Follow-up telephone visit 1 to 2 months   Patient expressed understanding and was in agreement with this plan. She also understands that She can call the clinic at  any time with any questions, concerns, or complaints.     Time Total: 20 minutes  Visit consisted of counseling and education dealing with the complex and emotionally intense issues of symptom management and palliative care in the setting of serious and potentially life-threatening illness.Greater than 50%  of this time was spent counseling and coordinating care related to the above assessment and plan.  Signed by: Fonda Mower, PhD, NP-C

## 2024-02-15 NOTE — Assessment & Plan Note (Signed)
 Likely secondary to chronically insufficiency/CHF.   Refer to wound care.  Refer to vascular surgery.

## 2024-02-15 NOTE — Assessment & Plan Note (Signed)
 Atrial flutter: with RVR. S/p TEE & cardioverison on 12/28/23  follow-up with cardiology.  Continue metoprolol  and Eliquis 

## 2024-02-15 NOTE — Assessment & Plan Note (Signed)
 Recommend gabapentin  100mg  TID PRN.

## 2024-02-15 NOTE — Assessment & Plan Note (Signed)
 Chemotherapy plan as listed above

## 2024-02-15 NOTE — Assessment & Plan Note (Addendum)
 Imaging findings and different lesion biopsy results were reviewed and discussed with patient. 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  Other differential diagnosis include GI tract adenocarcinoma, cholangiocarcinoma etc. CEA is elevated.  His CA 19-9 is normal. CT and MRI imaging is did not show any GI primary.I recommend a PET scan for further evaluation- patient rescheduled appt initially and later on PET scan was not done due to admission. I offered to call her daughter to give her updates and patient declines.   Labs are reviewed and discussed with patient. Patient did not tolerate Gemciabine and Abraxane  well.  She understands that with her performance status, multiple medical conditions, she is at risk of developing treatment related complications. She strongly desires to continue cancer treatments.  Discontinue Abraxane , shared decision was made to proceed with dose reduced Gemcitabine  900mg /m2.  Recommend patient continue follow-up with palliative care service.

## 2024-02-15 NOTE — Progress Notes (Signed)
 Due to toxcity  abraxane  held and single agent gemzar  dosed at 900mg /m2 day 1 and 8

## 2024-02-16 LAB — CEA: CEA: 58 ng/mL — ABNORMAL HIGH (ref 0.0–4.7)

## 2024-02-18 ENCOUNTER — Inpatient Hospital Stay

## 2024-02-18 ENCOUNTER — Inpatient Hospital Stay: Attending: Oncology

## 2024-02-18 ENCOUNTER — Telehealth: Payer: Self-pay

## 2024-02-18 VITALS — BP 153/84 | HR 66 | Temp 96.0°F | Resp 18 | Wt 267.7 lb

## 2024-02-18 DIAGNOSIS — C787 Secondary malignant neoplasm of liver and intrahepatic bile duct: Secondary | ICD-10-CM | POA: Diagnosis not present

## 2024-02-18 DIAGNOSIS — C259 Malignant neoplasm of pancreas, unspecified: Secondary | ICD-10-CM | POA: Diagnosis not present

## 2024-02-18 DIAGNOSIS — Z5111 Encounter for antineoplastic chemotherapy: Secondary | ICD-10-CM | POA: Insufficient documentation

## 2024-02-18 MED ORDER — PROCHLORPERAZINE MALEATE 10 MG PO TABS
10.0000 mg | ORAL_TABLET | Freq: Once | ORAL | Status: AC
  Administered 2024-02-18: 10 mg via ORAL
  Filled 2024-02-18: qty 1

## 2024-02-18 MED ORDER — SODIUM CHLORIDE 0.9 % IV SOLN
INTRAVENOUS | Status: DC
  Filled 2024-02-18: qty 250

## 2024-02-18 MED ORDER — SODIUM CHLORIDE 0.9 % IV SOLN
900.0000 mg/m2 | Freq: Once | INTRAVENOUS | Status: AC
  Administered 2024-02-18: 2090 mg via INTRAVENOUS
  Filled 2024-02-18: qty 54.97

## 2024-02-18 MED ORDER — HEPARIN SOD (PORK) LOCK FLUSH 100 UNIT/ML IV SOLN
500.0000 [IU] | Freq: Once | INTRAVENOUS | Status: AC | PRN
  Administered 2024-02-18: 500 [IU]
  Filled 2024-02-18: qty 5

## 2024-02-18 NOTE — Patient Instructions (Signed)
 [facility] - A DEPT OF [facility]  Discharge Instructions: Thank you for choosing [facility] to provide your oncology and hematology care.  If you have a lab appointment with the Cancer Center, please go directly to the Cancer Center and check in at the registration area.  Wear comfortable clothing and clothing appropriate for easy access to any Portacath or PICC line.   We strive to give you quality time with your provider. You may need to reschedule your appointment if you arrive late (15 or more minutes).  Arriving late affects you and other patients whose appointments are after yours.  Also, if you miss three or more appointments without notifying the office, you may be dismissed from the clinic at the provider's discretion.      For prescription refill requests, have your pharmacy contact our office and allow 72 hours for refills to be completed.    Today you received the following chemotherapy and/or immunotherapy agents GEMZAR      To help prevent nausea and vomiting after your treatment, we encourage you to take your nausea medication as directed.  BELOW ARE SYMPTOMS THAT SHOULD BE REPORTED IMMEDIATELY: *FEVER GREATER THAN 100.4 F (38 C) OR HIGHER *CHILLS OR SWEATING *NAUSEA AND VOMITING THAT IS NOT CONTROLLED WITH YOUR NAUSEA MEDICATION *UNUSUAL SHORTNESS OF BREATH *UNUSUAL BRUISING OR BLEEDING *URINARY PROBLEMS (pain or burning when urinating, or frequent urination) *BOWEL PROBLEMS (unusual diarrhea, constipation, pain near the anus) TENDERNESS IN MOUTH AND THROAT WITH OR WITHOUT PRESENCE OF ULCERS (sore throat, sores in mouth, or a toothache) UNUSUAL RASH, SWELLING OR PAIN  UNUSUAL VAGINAL DISCHARGE OR ITCHING   Items with * indicate a potential emergency and should be followed up as soon as possible or go to the Emergency Department if any problems should occur.  Please show the CHEMOTHERAPY ALERT CARD or IMMUNOTHERAPY ALERT  CARD at check-in to the Emergency Department and triage nurse.  Should you have questions after your visit or need to cancel or reschedule your appointment, please contact [facility] - A DEPT OF [facility]  [phone] and follow the prompts.  Office hours are 8:00 a.m. to 4:30 p.m. Monday - Friday. Please note that voicemails left after 4:00 p.m. may not be returned until the following business day.  We are closed weekends and major holidays. You have access to a nurse at all times for urgent questions. Please call the main number to the clinic [phone] and follow the prompts.  For any non-urgent questions, you may also contact your provider using MyChart. We now offer e-Visits for anyone 58 and older to request care online for non-urgent symptoms. For details visit [email or web address].   Also download the MyChart app! Go to the app store, search "MyChart", open the app, select [place], and log in with your MyChart username and password.  Gemcitabine Injection What is this medication? GEMCITABINE (jem SYE ta been) treats some types of cancer. It works by slowing down the growth of cancer cells. This medicine may be used for other purposes; ask your health care provider or pharmacist if you have questions. COMMON BRAND NAME(S): Gemzar, Infugem What should I tell my care team before I take this medication? They need to know if you have any of these conditions: Blood disorders Infection Kidney disease Liver disease Lung or breathing disease, such as asthma or COPD Recent or ongoing radiation therapy An unusual or allergic  reaction to gemcitabine, other medications, foods, dyes, or preservatives If you or your partner are pregnant or trying to get pregnant Breast-feeding How should I use this medication? This medication is injected into a vein. It is given by your care team in a hospital or clinic setting. Talk to your care team about the use of  this medication in children. Special care may be needed. Overdosage: If you think you have taken too much of this medicine contact a poison control center or emergency room at once. NOTE: This medicine is only for you. Do not share this medicine with others. What if I miss a dose? Keep appointments for follow-up doses. It is important not to miss your dose. Call your care team if you are unable to keep an appointment. What may interact with this medication? Interactions have not been studied. This list may not describe all possible interactions. Give your health care provider a list of all the medicines, herbs, non-prescription drugs, or dietary supplements you use. Also tell them if you smoke, drink alcohol, or use illegal drugs. Some items may interact with your medicine. What should I watch for while using this medication? Your condition will be monitored carefully while you are receiving this medication. This medication may make you feel generally unwell. This is not uncommon, as chemotherapy can affect healthy cells as well as cancer cells. Report any side effects. Continue your course of treatment even though you feel ill unless your care team tells you to stop. In some cases, you may be given additional medications to help with side effects. Follow all directions for their use. This medication may increase your risk of getting an infection. Call your care team for advice if you get a fever, chills, sore throat, or other symptoms of a cold or flu. Do not treat yourself. Try to avoid being around people who are sick. This medication may increase your risk to bruise or bleed. Call your care team if you notice any unusual bleeding. Be careful brushing or flossing your teeth or using a toothpick because you may get an infection or bleed more easily. If you have any dental work done, tell your dentist you are receiving this medication. Avoid taking medications that contain aspirin, acetaminophen,  ibuprofen, naproxen, or ketoprofen unless instructed by your care team. These medications may hide a fever. Talk to your care team if you or your partner wish to become pregnant or think you might be pregnant. This medication can cause serious birth defects if taken during pregnancy and for 6 months after the last dose. A negative pregnancy test is required before starting this medication. A reliable form of contraception is recommended while taking this medication and for 6 months after the last dose. Talk to your care team about effective forms of contraception. Do not father a child while taking this medication and for 3 months after the last dose. Use a condom while having sex during this time period. Do not breastfeed while taking this medication and for at least 1 week after the last dose. This medication may cause infertility. Talk to your care team if you are concerned about your fertility. What side effects may I notice from receiving this medication? Side effects that you should report to your care team as soon as possible: Allergic reactions--skin rash, itching, hives, swelling of the face, lips, tongue, or throat Capillary leak syndrome--stomach or muscle pain, unusual weakness or fatigue, feeling faint or lightheaded, decrease in the amount of urine, swelling of  the ankles, hands, or feet, trouble breathing Infection--fever, chills, cough, sore throat, wounds that don't heal, pain or trouble when passing urine, general feeling of discomfort or being unwell Liver injury--right upper belly pain, loss of appetite, nausea, light-colored stool, dark yellow or brown urine, yellowing skin or eyes, unusual weakness or fatigue Low red blood cell level--unusual weakness or fatigue, dizziness, headache, trouble breathing Lung injury--shortness of breath or trouble breathing, cough, spitting up blood, chest pain, fever Stomach pain, bloody diarrhea, pale skin, unusual weakness or fatigue, decrease in  the amount of urine, which may be signs of hemolytic uremic syndrome Sudden and severe headache, confusion, change in vision, seizures, which may be signs of posterior reversible encephalopathy syndrome (PRES) Unusual bruising or bleeding Side effects that usually do not require medical attention (report to your care team if they continue or are bothersome): Diarrhea Drowsiness Hair loss Nausea Pain, redness, or swelling with sores inside the mouth or throat Vomiting This list may not describe all possible side effects. Call your doctor for medical advice about side effects. You may report side effects to FDA at 1-800-FDA-1088. Where should I keep my medication? This medication is given in a hospital or clinic. It will not be stored at home. NOTE: This sheet is a summary. It may not cover all possible information. If you have questions about this medicine, talk to your doctor, pharmacist, or health care provider.  2024 Elsevier/Gold Standard (2021-12-10 00:00:00)

## 2024-02-18 NOTE — Telephone Encounter (Signed)
 Clinical Social Work was referred by Charity fundraiser for assessment of psychosocial needs.  CSW attempted to contact patient by phone.  Left voicemail with contact information and request for return call.

## 2024-02-18 NOTE — Progress Notes (Signed)
 [facility] Clinical Social Work  Clinical Social Work was referred by Engineer, civil (consulting) for financial concerns.  Clinical Social Worker contacted patient by phone to offer support and assess for needs.    Patient reports not being able to pay her rent this month.  Interventions: Provided patient with information about the [employer].  Patient receives food stamps and meets the financial requirement.  CSW sent referral to [name] Drummer       Follow Up Plan:  CSW will follow-up with patient by phone     Macario CHRISTELLA Au, LCSW  Clinical Social Worker [facility]        Patient is participating in a Managed Medicaid Plan:  Yes

## 2024-02-23 ENCOUNTER — Telehealth: Payer: Self-pay | Admitting: Pharmacy Technician

## 2024-02-23 ENCOUNTER — Telehealth: Payer: Self-pay | Admitting: *Deleted

## 2024-02-23 NOTE — Telephone Encounter (Signed)
 Patient called to say she wanted to get in touch with Josh he had something to ask of him.  And she says that she cannot do the CT scan she just cannot get on that table.  The scan was canceled already.  I called and got a voicemail stating that Sidra is not going to be here for a whole week and she can call back and let me know what is going on and I can ask that question to Dr. Babara instead

## 2024-02-23 NOTE — Telephone Encounter (Signed)
 Attempted to call patient.  Unable to reach.  Left message.  [name] [name] Patient Services Navigator [facility]

## 2024-02-24 ENCOUNTER — Ambulatory Visit

## 2024-02-24 ENCOUNTER — Encounter

## 2024-02-29 ENCOUNTER — Telehealth: Payer: Self-pay | Admitting: Pharmacy Technician

## 2024-02-29 NOTE — Telephone Encounter (Signed)
 Spoke with patient.  Patient to bring EBT card and sign paperwork for [employer] at her next appointment on 03/03/24.  Patient requesting assistance for rent, but does not have the rental agreement.  Assisted patient with contacting [employer] to obtain rental agreement.  Spoke with Dianna.  Rental agreement is to be e-mailed to me.  Once received, will process for payment.  Kristina Cruz Patient Services Navigator [facility]

## 2024-03-02 ENCOUNTER — Telehealth: Admitting: Hospice and Palliative Medicine

## 2024-03-03 ENCOUNTER — Encounter: Payer: Self-pay | Admitting: Oncology

## 2024-03-03 ENCOUNTER — Inpatient Hospital Stay

## 2024-03-03 ENCOUNTER — Telehealth: Payer: Self-pay | Admitting: Pharmacy Technician

## 2024-03-03 ENCOUNTER — Inpatient Hospital Stay: Admitting: Oncology

## 2024-03-03 VITALS — BP 149/72

## 2024-03-03 VITALS — BP 165/80 | HR 62 | Temp 96.6°F | Resp 18 | Wt 262.8 lb

## 2024-03-03 DIAGNOSIS — T451X5A Adverse effect of antineoplastic and immunosuppressive drugs, initial encounter: Secondary | ICD-10-CM

## 2024-03-03 DIAGNOSIS — G893 Neoplasm related pain (acute) (chronic): Secondary | ICD-10-CM

## 2024-03-03 DIAGNOSIS — Z5111 Encounter for antineoplastic chemotherapy: Secondary | ICD-10-CM

## 2024-03-03 DIAGNOSIS — C259 Malignant neoplasm of pancreas, unspecified: Secondary | ICD-10-CM

## 2024-03-03 DIAGNOSIS — R600 Localized edema: Secondary | ICD-10-CM

## 2024-03-03 DIAGNOSIS — I4891 Unspecified atrial fibrillation: Secondary | ICD-10-CM | POA: Diagnosis not present

## 2024-03-03 DIAGNOSIS — G620 Drug-induced polyneuropathy: Secondary | ICD-10-CM | POA: Diagnosis not present

## 2024-03-03 LAB — CBC WITH DIFFERENTIAL (CANCER CENTER ONLY)
Abs Immature Granulocytes: 0.01 K/uL (ref 0.00–0.07)
Basophils Absolute: 0 K/uL (ref 0.0–0.1)
Basophils Relative: 0 %
Eosinophils Absolute: 0.3 K/uL (ref 0.0–0.5)
Eosinophils Relative: 5 %
HCT: 31.5 % — ABNORMAL LOW (ref 36.0–46.0)
Hemoglobin: 10.4 g/dL — ABNORMAL LOW (ref 12.0–15.0)
Immature Granulocytes: 0 %
Lymphocytes Relative: 25 %
Lymphs Abs: 1.4 K/uL (ref 0.7–4.0)
MCH: 31.8 pg (ref 26.0–34.0)
MCHC: 33 g/dL (ref 30.0–36.0)
MCV: 96.3 fL (ref 80.0–100.0)
Monocytes Absolute: 0.7 K/uL (ref 0.1–1.0)
Monocytes Relative: 13 %
Neutro Abs: 3.3 K/uL (ref 1.7–7.7)
Neutrophils Relative %: 57 %
Platelet Count: 402 K/uL — ABNORMAL HIGH (ref 150–400)
RBC: 3.27 MIL/uL — ABNORMAL LOW (ref 3.87–5.11)
RDW: 17.8 % — ABNORMAL HIGH (ref 11.5–15.5)
WBC Count: 5.7 K/uL (ref 4.0–10.5)
nRBC: 0 % (ref 0.0–0.2)

## 2024-03-03 LAB — CMP (CANCER CENTER ONLY)
ALT: 14 U/L (ref 0–44)
AST: 21 U/L (ref 15–41)
Albumin: 3.4 g/dL — ABNORMAL LOW (ref 3.5–5.0)
Alkaline Phosphatase: 134 U/L — ABNORMAL HIGH (ref 38–126)
Anion gap: 9 (ref 5–15)
BUN: 11 mg/dL (ref 8–23)
CO2: 24 mmol/L (ref 22–32)
Calcium: 8.6 mg/dL — ABNORMAL LOW (ref 8.9–10.3)
Chloride: 102 mmol/L (ref 98–111)
Creatinine: 0.86 mg/dL (ref 0.44–1.00)
GFR, Estimated: >60 mL/min (ref >60)
Glucose, Bld: 148 mg/dL — ABNORMAL HIGH (ref 70–99)
Potassium: 3.4 mmol/L — ABNORMAL LOW (ref 3.5–5.1)
Sodium: 135 mmol/L (ref 135–145)
Total Bilirubin: 0.6 mg/dL (ref 0.0–1.2)
Total Protein: 7.5 g/dL (ref 6.5–8.1)

## 2024-03-03 MED ORDER — SODIUM CHLORIDE 0.9% FLUSH
10.0000 mL | INTRAVENOUS | Status: DC | PRN
  Filled 2024-03-03: qty 10

## 2024-03-03 MED ORDER — SODIUM CHLORIDE 0.9 % IV SOLN
INTRAVENOUS | Status: DC
  Filled 2024-03-03: qty 250

## 2024-03-03 MED ORDER — SODIUM CHLORIDE 0.9 % IV SOLN
900.0000 mg/m2 | Freq: Once | INTRAVENOUS | Status: AC
  Administered 2024-03-03: 2090 mg via INTRAVENOUS
  Filled 2024-03-03: qty 54.97

## 2024-03-03 MED ORDER — HEPARIN SOD (PORK) LOCK FLUSH 100 UNIT/ML IV SOLN
500.0000 [IU] | Freq: Once | INTRAVENOUS | Status: AC | PRN
  Administered 2024-03-03: 500 [IU]
  Filled 2024-03-03: qty 5

## 2024-03-03 MED ORDER — PROCHLORPERAZINE MALEATE 10 MG PO TABS
10.0000 mg | ORAL_TABLET | Freq: Once | ORAL | Status: AC
  Administered 2024-03-03: 10 mg via ORAL
  Filled 2024-03-03: qty 1

## 2024-03-03 NOTE — Progress Notes (Signed)
 Hematology/Oncology Progress note [phone] [phone]      CHIEF COMPLAINTS/PURPOSE OF CONSULTATION:  Stage IV pancreatic carcinoma with liver metastasis.  ASSESSMENT & PLAN:   Malignant neoplasm of pancreas [facility]) Imaging findings and different lesion biopsy results were reviewed and discussed with patient. 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  Other differential diagnosis include GI tract adenocarcinoma, cholangiocarcinoma etc. CEA is elevated.  His CA 19-9 is normal. CT and MRI imaging is did not show any GI primary.I recommend a PET scan for further evaluation- patient rescheduled appt initially and later on PET scan was not done due to admission. I offered to call her daughter to give her updates and patient declines.   Labs are reviewed and discussed with patient. Patient did not tolerate Gemciabine and Abraxane  well.  Tolerates dose reduced monotherapy Gemcitaibine 900mg /m2 well.  Proceed with D1 gemcitabine . She will return in 2 weeks for D15 treatments.  CEA is trending down.  She has established care with palliative care service.     Atrial fibrillation with RVR (HCC) Atrial flutter: with RVR. S/p TEE & cardioverison on 12/28/23  follow-up with cardiology.  Continue metoprolol  and Eliquis   Chemotherapy-induced neuropathy (HCC) Recommend gabapentin  100mg  TID PRN.    Encounter for antineoplastic chemotherapy Chemotherapy plan as listed above  Leg edema Likely secondary to chronically insufficiency/CHF.   I have referred to wound care.  Referred to vascular surgery.  Neoplasm related pain continue OxyContin  10 mg every 12 hours oxycodone  5 mg every 4-6 hours as needed -  .     Orders Placed This Encounter  Procedures   Cancer antigen 19-9    Standing Status:   Future    Expected Date:   03/31/2024    Expiration Date:   03/31/2025   CEA    Standing Status:   Future    Expected  Date:   03/31/2024    Expiration Date:   03/31/2025   CBC with Differential (Cancer Center Only)    Standing Status:   Future    Expected Date:   03/31/2024    Expiration Date:   03/31/2025   CMP (Cancer Center only)    Standing Status:   Future    Expected Date:   03/31/2024    Expiration Date:   03/31/2025   CBC with Differential (Cancer Center Only)    Standing Status:   Future    Expected Date:   04/14/2024    Expiration Date:   04/14/2025   CMP (Cancer Center only)    Standing Status:   Future    Expected Date:   04/14/2024    Expiration Date:   04/14/2025   CBC with Differential (Cancer Center Only)    Standing Status:   Future    Expected Date:   03/17/2024    Expiration Date:   03/17/2025   CMP (Cancer Center only)    Standing Status:   Future    Expected Date:   03/17/2024    Expiration Date:   03/17/2025   Follow-up 2 weeks All questions were answered. The patient knows to call the clinic with any problems, questions or concerns.  [name], MD, PhD [facility] Hematology Oncology 03/03/2024    HISTORY OF PRESENTING ILLNESS:  Kristina Cruz 62 y.o. female presents to establish care for Stage IV pancreatic cancer I have reviewed her chart and materials related to her cancer extensively and collaborated history with the patient. Summary of oncologic history  is as follows: Oncology History  Malignant neoplasm of pancreas (HCC)  08/21/2023 Imaging   CT abdomen pelvis with contrast showed 1. Masslike thickening involving the proximal and mid tail of pancreas with relative hypoenhancement measures approximately 3.9 x 5.5 cm. In the absence of signs/symptoms of acute pancreatitis underlying neoplastic process cannot be excluded. Further evaluation with nonemergent contrast enhanced MRI of the abdomen is advised. Note: Given patient's body habitus and the motion artifact observed on the current exam an MRI obtained at this time is likely to be severely limited and likely  nondiagnostic. MRI should be obtained only once the patient is clinically stable, and is able to remain motionless and breath hold. 2. Patchy ground-glass and airspace densities identified within both lower lobes. These are new compared with the previous exam and are concerning for underlying inflammatory/infectious process. 3. Sigmoid diverticulosis without signs of acute diverticulitis. 4. Marked diastasis recti with ventral herniation of the large and small bowel loops. 5. Periumbilical hernia is containing fat only.   08/24/2023 Imaging   MRI abdomen wo contrast  1. Moderately suboptimal unenhanced exam. 2. Heterogeneous masslike thickening of the pancreatic body/tail again seen. The lesion remains indeterminate on this exam. Differential diagnosis includes pancreatic tumor, autoimmune pancreatitis, mass forming chronic pancreatitis, etc. Please see above for follow-up recommendations. 3. Mild diffuse hepatic steatosis. There are indeterminate areas in the liver, which can also be better evaluated on the contrast-enhanced MRI abdomen. 4. Other observations, as described above.   11/05/2023 Initial Diagnosis   Pancreatic cancer metastasized to liver  Patient was hospitalized from 11/05/2023 - 11/10/2023 She presented to emergency room due to nausea, vomiting and abdominal pain. CT scan showed multiple hypodense hepatic lesions increasing number and size from prior imaging.  Pancreatic tail mass, hypodense lesion in the anterior spleen.  11/09/2023 ultrasound-guided biopsy was obtained. Pathology showed moderately to poorly differentiated adenocarcinoma with extensive areas of tumor necrosis. The adenocarcinoma is positive for cytokeratin 7 and CDX2 (focal).     Cytokeratin 20, TTF-1, and GATA3 are negative. The pattern of immunoreactivity is not specific and the differential diagnosis includes metastatic adenocarcinoma from the pancreaticobiliary tract and upper GI. Intrahepatic        cholangiocarcinoma is also a diagnostic possibility. Given the radiographic  findings a pancreaticobiliary primary is favored and correlation with clinical impression is required.    11/17/2023 Cancer Staging   Staging form: Exocrine Pancreas, AJCC 8th Edition - Clinical stage from 11/17/2023: Stage IV (cT3, cNX, pM1) - Signed by Babara Call, MD on 11/17/2023 Stage prefix: Initial diagnosis    Imaging   CT abdomen pelvis w contrast  1. Multiple hypodense hepatic lesions, increased in number and size from prior imaging, highly suspicious for metastatic disease. 2. Hypodense lesion in the pancreatic tail measures 7.2 x 3.9 cm, increased in size from prior. The increase in size favors pancreatic neoplasm, with additional etiologies less likely. 3. New hypodense lesion in the anterior spleen, 13 mm, suspicious for metastatic disease. 4. Laxity of the anterior abdominal wall with midline ventral abdominal wall hernia containing small and to a lesser extent large bowel. No bowel obstruction or inflammation. 5. Colonic diverticulosis without diverticulitis.   Aortic Atherosclerosis (ICD10-I70.0)    11/27/2023 -  Chemotherapy   Patient is on Treatment Plan : PANCREATIC Abraxane  D1,8,15 + Gemcitabine  D1,8,15 q28d      Hospitalization due to Atrial flutter: with RVR. S/p TEE & cardioverison on 12/28/23, severe sepsis, gastroenteritis.  01/30/2024 - 01/31/2024 patient was hospitalized due to  CHF exacerbation, she also received antibiotics for E. coli UTI.  Today patient denies any nausea vomiting diarrhea.  No fever or chills.  She feels well. Tolerated gemcitabine  monotherapy well.    chronic bilateral lower extremity edema, skin breakdown.    MEDICAL HISTORY:  Past Medical History:  Diagnosis Date   Anemia    Asthma    Back pain    Cataract    Diabetes mellitus without complication (HCC)    GERD (gastroesophageal reflux disease)    History of echocardiogram    Hypertension    Morbid obesity  with BMI of 60.0-69.9, adult (HCC)    Restless leg syndrome     SURGICAL HISTORY: Past Surgical History:  Procedure Laterality Date   ABDOMINAL HYSTERECTOMY     CARDIOVERSION N/A 12/28/2023   Procedure: CARDIOVERSION;  Surgeon: Darliss Rogue, MD;  Location: [facility] ORS;  Service: Cardiovascular;  Laterality: N/A;   CATARACT EXTRACTION W/PHACO Left 04/13/2020   Procedure: CATARACT EXTRACTION PHACO AND INTRAOCULAR LENS PLACEMENT (IOC);  Surgeon: Ferol Rogue, MD;  Location: [facility] ORS;  Service: Ophthalmology;  Laterality: Left;  US  00:36.0 CDE 5.95 Fluid Pack lot # [id number]   HYSTEROSCOPY WITH D & C N/A 07/14/2017   Procedure: DILATATION AND CURETTAGE /HYSTEROSCOPY;  Surgeon: Arloa Lamar SQUIBB, MD;  Location: [facility] ORS;  Service: Gynecology;  Laterality: N/A;   IR IMAGING GUIDED PORT INSERTION  12/17/2023   POLYPECTOMY  2015   TEE WITHOUT CARDIOVERSION N/A 12/28/2023   Procedure: ECHOCARDIOGRAM, TRANSESOPHAGEAL;  Surgeon: Darliss Rogue, MD;  Location: [facility] ORS;  Service: Cardiovascular;  Laterality: N/A;    SOCIAL HISTORY: Social History   Socioeconomic History   Marital status: Single    Spouse name: Not on file   Number of children: Not on file   Years of education: Not on file   Highest education level: Not on file  Occupational History   Not on file  Tobacco Use   Smoking status: Never   Smokeless tobacco: Never  Vaping Use   Vaping status: Never Used  Substance and Sexual Activity   Alcohol use: No   Drug use: No   Sexual activity: Never    Birth control/protection: None  Other Topics Concern   Not on file  Social History Narrative   Not on file   Social Drivers of Health   Financial Resource Strain: Low Risk  (09/26/2022)   Received from [facility]   Overall Financial Resource Strain (CARDIA)    Difficulty of Paying Living Expenses: Not hard at all  Food Insecurity: Patient Declined (01/30/2024)   Hunger Vital Sign    Worried About Running  Out of Food in the Last Year: Patient declined    Ran Out of Food in the Last Year: Patient declined  Transportation Needs: Patient Declined (01/30/2024)   PRAPARE - Administrator, Civil Service (Medical): Patient declined    Lack of Transportation (Non-Medical): Patient declined  Physical Activity: Not on file  Stress: Not on file  Social Connections: Socially Isolated (12/25/2023)   Social Connection and Isolation Panel    Frequency of Communication with Friends and Family: More than three times a week    Frequency of Social Gatherings with Friends and Family: More than three times a week    Attends Religious Services: Never    Database administrator or Organizations: No    Attends Banker Meetings: Never    Marital Status: Never married  Intimate Partner Violence: Patient Declined (  01/30/2024)   Humiliation, Afraid, Rape, and Kick questionnaire    Fear of Current or Ex-Partner: Patient declined    Emotionally Abused: Patient declined    Physically Abused: Patient declined    Sexually Abused: Patient declined    FAMILY HISTORY: Family History  Problem Relation Age of Onset   Breast cancer Mother 88   Diabetes Mother    Hypertension Mother    Ovarian cancer Paternal Aunt        ?   Diabetes Father    Hypertension Father     ALLERGIES:  is allergic to peanut-containing drug products.  MEDICATIONS:  Current Outpatient Medications  Medication Sig Dispense Refill   acetaminophen  (TYLENOL  8 HOUR ARTHRITIS PAIN) 650 MG CR tablet Take 3 tablets (1,950 mg total) by mouth every 8 (eight) hours as needed for pain. Do not exceed 4000 mg total in a day.  Home med.     apixaban  (ELIQUIS ) 5 MG TABS tablet Take 1 tablet (5 mg total) by mouth 2 (two) times daily. 60 tablet 0   azelastine  (ASTELIN ) 0.1 % nasal spray Place 1 spray into both nostrils 2 (two) times daily.     baclofen  (LIORESAL ) 10 MG tablet Take 10 mg by mouth 3 (three) times daily.      diphenoxylate -atropine  (LOMOTIL ) 2.5-0.025 MG tablet Take 2 tablets by mouth every 6 (six) hours as needed (for diarrhea unrelieved by imodium). 90 tablet 0   EPINEPHrine  0.3 mg/0.3 mL IJ SOAJ injection Inject 0.3 mg into the muscle as needed for anaphylaxis. 1 each 0   fluticasone  (FLONASE ) 50 MCG/ACT nasal spray Place 1 spray into both nostrils daily. 9.9 mL 0   fluticasone  furoate-vilanterol (BREO ELLIPTA ) 100-25 MCG/ACT AEPB Inhale 1 puff into the lungs daily. 28 each 1   furosemide  (LASIX ) 40 MG tablet Take 40 mg by mouth daily.     gabapentin  (NEURONTIN ) 100 MG capsule Take 1 capsule (100 mg total) by mouth 3 (three) times daily. 60 capsule 0   ipratropium-albuterol  (DUONEB) 0.5-2.5 (3) MG/3ML SOLN Take 3 mLs by nebulization every 4 (four) hours as needed (wheezing / shortness of breath). 360 mL 1   levocetirizine (XYZAL ) 5 MG tablet Take 5 mg by mouth daily.     lidocaine -prilocaine  (EMLA ) cream Apply 1 Application topically as needed. Apply to port and cover with saran wrap 1-2 hours prior to port access 30 g 1   magic mouthwash (lidocaine , diphenhydrAMINE , alum & mag hydroxide) suspension Swish and swallow 5 mLs 4 (four) times daily as needed for mouth pain. 360 mL 1   metFORMIN  (GLUCOPHAGE ) 1000 MG tablet Take 1,000 mg by mouth 2 (two) times daily.     metoprolol  tartrate (LOPRESSOR ) 25 MG tablet Take 1 tablet (25 mg total) by mouth 2 (two) times daily. 60 tablet 0   morphine  (MS CONTIN ) 15 MG 12 hr tablet Take 1 tablet (15 mg total) by mouth every 12 (twelve) hours. 30 tablet 0   naloxone  (NARCAN ) nasal spray 4 mg/0.1 mL SPRAY 1 SPRAY INTO ONE NOSTRIL AS DIRECTED FOR OPIOID OVERDOSE (TURN PERSON ON SIDE AFTER DOSE. IF NO RESPONSE IN 2-3 MINUTES OR PERSON RESPONDS BUT RELAPSES, REPEAT USING A NEW SPRAY DEVICE AND SPRAY INTO THE OTHER NOSTRIL. CALL 911 AFTER USE.) * EMERGENCY USE ONLY * 1 each 0   ondansetron  (ZOFRAN -ODT) 4 MG disintegrating tablet Take 1 tablet (4 mg total) by mouth every 8  (eight) hours as needed for nausea or vomiting. 45 tablet 0   oxyCODONE  (OXY  IR/ROXICODONE ) 5 MG immediate release tablet Take 1 tablet (5 mg total) by mouth every 4 (four) hours as needed for moderate pain (pain score 4-6) or severe pain (pain score 7-10). 90 tablet 0   prochlorperazine  (COMPAZINE ) 10 MG tablet Take 1 tablet (10 mg total) by mouth every 6 (six) hours as needed for nausea or vomiting. 30 tablet 1   rOPINIRole  (REQUIP ) 2 MG tablet Take 2 mg by mouth in the morning and at bedtime.      spironolactone  (ALDACTONE ) 50 MG tablet Take 1 tablet (50 mg total) by mouth daily. 30 tablet 1   traZODone  (DESYREL ) 50 MG tablet Take 0.5-1 tablets (25-50 mg total) by mouth at bedtime as needed for sleep. 30 tablet 2   VENTOLIN  HFA 108 (90 Base) MCG/ACT inhaler Inhale 2 puffs into the lungs every 4 (four) hours as needed for wheezing or shortness of breath. 18 g 1   No current facility-administered medications for this visit.   Facility-Administered Medications Ordered in Other Visits  Medication Dose Route Frequency Provider Last Rate Last Admin   0.9 %  sodium chloride  infusion   Intravenous Continuous Babara Call, MD   Stopped at 03/03/24 1150   sodium chloride  flush (NS) 0.9 % injection 10 mL  10 mL Intracatheter PRN Babara Call, MD        Review of Systems  Constitutional:  Positive for fatigue. Negative for appetite change, chills and fever.  HENT:   Negative for hearing loss and voice change.   Eyes:  Negative for eye problems.  Respiratory:  Negative for chest tightness, cough and shortness of breath.   Cardiovascular:  Positive for leg swelling. Negative for chest pain.  Gastrointestinal:  Positive for nausea. Negative for abdominal distention, abdominal pain and blood in stool.  Endocrine: Negative for hot flashes.  Genitourinary:  Negative for difficulty urinating and frequency.   Musculoskeletal:  Negative for arthralgias.  Skin:  Negative for itching and rash.  Neurological:   Negative for extremity weakness.  Hematological:  Negative for adenopathy.  Psychiatric/Behavioral:  Negative for confusion.      PHYSICAL EXAMINATION: ECOG PERFORMANCE STATUS: 1 - Symptomatic but completely ambulatory  Vitals:   03/03/24 0922 03/03/24 0933  BP: (!) 176/83 (!) 165/80  Pulse: 62   Resp: 18   Temp: (!) 96.6 F (35.9 C)   SpO2: 98%    Filed Weights   03/03/24 0922  Weight: 262 lb 12.8 oz (119.2 kg)     Physical Exam Constitutional:      General: She is not in acute distress.    Appearance: She is obese. She is not diaphoretic.  HENT:     Head: Normocephalic and atraumatic.  Eyes:     General: No scleral icterus. Cardiovascular:     Rate and Rhythm: Normal rate and regular rhythm.  Pulmonary:     Effort: Pulmonary effort is normal. No respiratory distress.     Breath sounds: No wheezing or rales.     Comments: Decreased breath sound bilaterally Abdominal:     General: Bowel sounds are normal. There is no distension.     Palpations: Abdomen is soft.  Musculoskeletal:        General: Swelling present. Normal range of motion.     Cervical back: Normal range of motion and neck supple.     Right lower leg: Edema present.     Left lower leg: Edema present.  Skin:    General: Skin is warm and dry.  Findings: No erythema.  Neurological:     Mental Status: She is alert and oriented to person, place, and time. Mental status is at baseline.     Motor: No abnormal muscle tone.  Psychiatric:        Mood and Affect: Mood and affect normal.      LABORATORY DATA:  I have reviewed the data as listed    Latest Ref Rng & Units 03/03/2024    9:12 AM 02/15/2024    8:55 AM 01/30/2024   10:36 AM  CBC  WBC 4.0 - 10.5 K/uL 5.7  5.4  4.3   Hemoglobin 12.0 - 15.0 g/dL 89.5  89.6  8.8   Hematocrit 36.0 - 46.0 % 31.5  31.2  26.5   Platelets 150 - 400 K/uL 402  288  130       Latest Ref Rng & Units 03/03/2024    9:12 AM 02/15/2024    8:55 AM 01/31/2024    4:23 AM   CMP  Glucose 70 - 99 mg/dL 851  839  844   BUN 8 - 23 mg/dL 11  9  6    Creatinine 0.44 - 1.00 mg/dL 9.13  9.16  9.12   Sodium 135 - 145 mmol/L 135  135  138   Potassium 3.5 - 5.1 mmol/L 3.4  3.7  3.9   Chloride 98 - 111 mmol/L 102  102  102   CO2 22 - 32 mmol/L 24  25  29    Calcium  8.9 - 10.3 mg/dL 8.6  8.6  8.0   Total Protein 6.5 - 8.1 g/dL 7.5  7.6    Total Bilirubin 0.0 - 1.2 mg/dL 0.6  0.6    Alkaline Phos 38 - 126 U/L 134  126    AST 15 - 41 U/L 21  23    ALT 0 - 44 U/L 14  20       RADIOGRAPHIC STUDIES: I have personally reviewed the radiological images as listed and agreed with the findings in the report. No results found.

## 2024-03-03 NOTE — Assessment & Plan Note (Signed)
 continue OxyContin  10 mg every 12 hours oxycodone  5 mg every 4-6 hours as needed -  .

## 2024-03-03 NOTE — Patient Instructions (Addendum)
 [facility] - A DEPT OF [facility]  Discharge Instructions: Thank you for choosing [facility] to provide your oncology and hematology care.  If you have a lab appointment with the Cancer Center, please go directly to the Cancer Center and check in at the registration area.  Wear comfortable clothing and clothing appropriate for easy access to any Portacath or PICC line.   We strive to give you quality time with your provider. You may need to reschedule your appointment if you arrive late (15 or more minutes).  Arriving late affects you and other patients whose appointments are after yours.  Also, if you miss three or more appointments without notifying the office, you may be dismissed from the clinic at the provider's discretion.      For prescription refill requests, have your pharmacy contact our office and allow 72 hours for refills to be completed.    Today you received the following chemotherapy and/or immunotherapy agents- gemzar      To help prevent nausea and vomiting after your treatment, we encourage you to take your nausea medication as directed.  BELOW ARE SYMPTOMS THAT SHOULD BE REPORTED IMMEDIATELY: *FEVER GREATER THAN 100.4 F (38 C) OR HIGHER *CHILLS OR SWEATING *NAUSEA AND VOMITING THAT IS NOT CONTROLLED WITH YOUR NAUSEA MEDICATION *UNUSUAL SHORTNESS OF BREATH *UNUSUAL BRUISING OR BLEEDING *URINARY PROBLEMS (pain or burning when urinating, or frequent urination) *BOWEL PROBLEMS (unusual diarrhea, constipation, pain near the anus) TENDERNESS IN MOUTH AND THROAT WITH OR WITHOUT PRESENCE OF ULCERS (sore throat, sores in mouth, or a toothache) UNUSUAL RASH, SWELLING OR PAIN  UNUSUAL VAGINAL DISCHARGE OR ITCHING   Items with * indicate a potential emergency and should be followed up as soon as possible or go to the Emergency Department if any problems should occur.  Please show the CHEMOTHERAPY ALERT CARD or IMMUNOTHERAPY  ALERT CARD at check-in to the Emergency Department and triage nurse.  Should you have questions after your visit or need to cancel or reschedule your appointment, please contact [facility] - A DEPT OF [facility]  [phone] and follow the prompts.  Office hours are 8:00 a.m. to 4:30 p.m. Monday - Friday. Please note that voicemails left after 4:00 p.m. may not be returned until the following business day.  We are closed weekends and major holidays. You have access to a nurse at all times for urgent questions. Please call the main number to the clinic [phone] and follow the prompts.  For any non-urgent questions, you may also contact your provider using MyChart. We now offer e-Visits for anyone 59 and older to request care online for non-urgent symptoms. For details visit [email or web address].   Also download the MyChart app! Go to the app store, search "MyChart", open the app, select [place], and log in with your MyChart username and password.

## 2024-03-03 NOTE — Assessment & Plan Note (Signed)
 Atrial flutter: with RVR. S/p TEE & cardioverison on 12/28/23  follow-up with cardiology.  Continue metoprolol  and Eliquis 

## 2024-03-03 NOTE — Assessment & Plan Note (Addendum)
 Likely secondary to chronically insufficiency/CHF.   I have referred to wound care.  Referred to vascular surgery.

## 2024-03-03 NOTE — Progress Notes (Signed)
 Nutrition Follow-up:  Patient with pancreatic cancer metastasized to liver.  Patient receiving gemcitabine  alone.  Met with patient during infusion.  Reports good appetite. Denies nausea, diarrhea, constipation, or taste alterations.  Noted admission for CHF.      Medications: lasix   Labs: glucose 148, K 3.4  Anthropometrics:   Weight 262 lb 12.8 oz today (?decreased fluid) 289 lb on 6/6 (fluid present) 269 lb on 4/11   NUTRITION DIAGNOSIS: Unintentional weight loss related to fluid as evidenced by weight loss    INTERVENTION:  Encouraged foods rich in protein    MONITORING, EVALUATION, GOAL: weight trends, intake   NEXT VISIT: Thursday, August 14 during infusion  Philbert Ocallaghan B. Dasie SOLON, CSO, LDN Registered Dietitian [phone]

## 2024-03-03 NOTE — Assessment & Plan Note (Addendum)
 Imaging findings and different lesion biopsy results were reviewed and discussed with patient. 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  Other differential diagnosis include GI tract adenocarcinoma, cholangiocarcinoma etc. CEA is elevated.  His CA 19-9 is normal. CT and MRI imaging is did not show any GI primary.I recommend a PET scan for further evaluation- patient rescheduled appt initially and later on PET scan was not done due to admission. I offered to call her daughter to give her updates and patient declines.   Labs are reviewed and discussed with patient. Patient did not tolerate Gemciabine and Abraxane  well.  Tolerates dose reduced monotherapy Gemcitaibine 900mg /m2 well.  Proceed with D1 gemcitabine . She will return in 2 weeks for D15 treatments.  CEA is trending down.  She has established care with palliative care service.

## 2024-03-03 NOTE — Telephone Encounter (Signed)
 Kristina Cruz is approved for the [employer] one-time grant.  She has provided a rental agreement and is asking for assistance with rent.  I have sent a W-9 form to be completed by her landlord.  I made patient aware that we needed the completed W-9 before a check would be issued by [facility] [employer].  Patient told me that she would follow-up with her landlord and make sure the form gets completed and e-mailed to my attention.  [name] [name] Patient Services Navigator [facility]

## 2024-03-03 NOTE — Assessment & Plan Note (Signed)
 Chemotherapy plan as listed above

## 2024-03-03 NOTE — Assessment & Plan Note (Signed)
 Recommend gabapentin  100mg  TID PRN.

## 2024-03-04 LAB — CANCER ANTIGEN 19-9: CA 19-9: <2 U/mL (ref 0–35)

## 2024-03-04 LAB — CEA: CEA: 45.5 ng/mL — ABNORMAL HIGH (ref 0.0–4.7)

## 2024-03-08 ENCOUNTER — Ambulatory Visit: Admitting: Physician Assistant

## 2024-03-10 ENCOUNTER — Other Ambulatory Visit

## 2024-03-10 ENCOUNTER — Ambulatory Visit

## 2024-03-10 ENCOUNTER — Ambulatory Visit: Admitting: Oncology

## 2024-03-16 ENCOUNTER — Inpatient Hospital Stay (HOSPITAL_BASED_OUTPATIENT_CLINIC_OR_DEPARTMENT_OTHER): Admitting: Hospice and Palliative Medicine

## 2024-03-16 ENCOUNTER — Ambulatory Visit: Admitting: Cardiology

## 2024-03-16 DIAGNOSIS — R52 Pain, unspecified: Secondary | ICD-10-CM | POA: Diagnosis not present

## 2024-03-16 DIAGNOSIS — Z515 Encounter for palliative care: Secondary | ICD-10-CM | POA: Diagnosis not present

## 2024-03-16 MED ORDER — OXYCODONE HCL 5 MG PO TABS: 5.0000 mg | ORAL_TABLET | ORAL | 0 refills | Status: DC | PRN

## 2024-03-16 NOTE — Progress Notes (Signed)
 Virtual Visit via Telephone Note  I connected with Kristina Cruz on 03/16/24 at  3:20 PM EDT by telephone and verified that I am speaking with the correct person using two identifiers.  Location: Patient: Home Provider: Clinic   I discussed the limitations, risks, security and privacy concerns of performing an evaluation and management service by telephone and the availability of in person appointments. I also discussed with the patient that there may be a patient responsible charge related to this service. The patient expressed understanding and agreed to proceed.   History of Present Illness: Kristina Cruz is a 63 y.o. 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 Palliative care consulted to address goals and manage ongoing symptoms.      Observations/Objective: Telephone visit today for follow-up.  Patient reports she is having generalized pain.  She says that it is worse today.  She is trying to avoid taking MS Contin /oxycodone  (both last refilled in April 2025).  I encouraged patient to take oxycodone  as needed for breakthrough pain.  Will send refill of oxycodone  today.  Patient would like to meet with social work tomorrow to discuss paperwork and resources for body donation program.  Assessment and Plan: Stage IV pancreatic cancer -on gemcitabine   Neoplasm related pain -refill oxycodone .  Daily bowel regimen to prevent opioid-induced constipation.  PDMP reviewed.  Advance directives -patient would like to meet with social work to discuss body donation program  Follow Up Instructions: Follow-up telephone visit 1 month   I discussed the assessment and treatment plan with the patient. The patient was provided an opportunity to ask questions and all were answered. The patient agreed with the plan and demonstrated an understanding of the instructions.   The patient  was advised to call back or seek an in-person evaluation if the symptoms worsen or if the condition fails to improve as anticipated.  I provided 10 minutes of non-face-to-face time during this encounter.   FONDA JONELLE MOWER, NP

## 2024-03-17 ENCOUNTER — Inpatient Hospital Stay (HOSPITAL_BASED_OUTPATIENT_CLINIC_OR_DEPARTMENT_OTHER): Admitting: Oncology

## 2024-03-17 ENCOUNTER — Telehealth: Payer: Self-pay | Admitting: Pharmacy Technician

## 2024-03-17 ENCOUNTER — Inpatient Hospital Stay

## 2024-03-17 ENCOUNTER — Encounter: Payer: Self-pay | Admitting: Oncology

## 2024-03-17 VITALS — BP 161/84 | HR 70 | Temp 96.6°F | Resp 19 | Wt 261.3 lb

## 2024-03-17 VITALS — BP 172/84 | HR 70 | Temp 98.0°F | Resp 18

## 2024-03-17 DIAGNOSIS — Z5111 Encounter for antineoplastic chemotherapy: Secondary | ICD-10-CM | POA: Diagnosis not present

## 2024-03-17 DIAGNOSIS — G620 Drug-induced polyneuropathy: Secondary | ICD-10-CM

## 2024-03-17 DIAGNOSIS — R600 Localized edema: Secondary | ICD-10-CM

## 2024-03-17 DIAGNOSIS — C259 Malignant neoplasm of pancreas, unspecified: Secondary | ICD-10-CM

## 2024-03-17 DIAGNOSIS — G893 Neoplasm related pain (acute) (chronic): Secondary | ICD-10-CM

## 2024-03-17 DIAGNOSIS — I4891 Unspecified atrial fibrillation: Secondary | ICD-10-CM

## 2024-03-17 DIAGNOSIS — T451X5A Adverse effect of antineoplastic and immunosuppressive drugs, initial encounter: Secondary | ICD-10-CM

## 2024-03-17 LAB — CBC WITH DIFFERENTIAL (CANCER CENTER ONLY)
Abs Immature Granulocytes: 0.02 K/uL (ref 0.00–0.07)
Basophils Absolute: 0 K/uL (ref 0.0–0.1)
Basophils Relative: 0 %
Eosinophils Absolute: 0.3 K/uL (ref 0.0–0.5)
Eosinophils Relative: 5 %
HCT: 33.4 % — ABNORMAL LOW (ref 36.0–46.0)
Hemoglobin: 11.2 g/dL — ABNORMAL LOW (ref 12.0–15.0)
Immature Granulocytes: 0 %
Lymphocytes Relative: 22 %
Lymphs Abs: 1.4 K/uL (ref 0.7–4.0)
MCH: 32.2 pg (ref 26.0–34.0)
MCHC: 33.5 g/dL (ref 30.0–36.0)
MCV: 96 fL (ref 80.0–100.0)
Monocytes Absolute: 0.9 K/uL (ref 0.1–1.0)
Monocytes Relative: 14 %
Neutro Abs: 3.7 K/uL (ref 1.7–7.7)
Neutrophils Relative %: 59 %
Platelet Count: 254 K/uL (ref 150–400)
RBC: 3.48 MIL/uL — ABNORMAL LOW (ref 3.87–5.11)
RDW: 17.7 % — ABNORMAL HIGH (ref 11.5–15.5)
WBC Count: 6.3 K/uL (ref 4.0–10.5)
nRBC: 0 % (ref 0.0–0.2)

## 2024-03-17 LAB — CMP (CANCER CENTER ONLY)
ALT: 12 U/L (ref 0–44)
AST: 19 U/L (ref 15–41)
Albumin: 3.5 g/dL (ref 3.5–5.0)
Alkaline Phosphatase: 136 U/L — ABNORMAL HIGH (ref 38–126)
Anion gap: 8 (ref 5–15)
BUN: 14 mg/dL (ref 8–23)
CO2: 25 mmol/L (ref 22–32)
Calcium: 8.8 mg/dL — ABNORMAL LOW (ref 8.9–10.3)
Chloride: 103 mmol/L (ref 98–111)
Creatinine: 0.82 mg/dL (ref 0.44–1.00)
GFR, Estimated: >60 mL/min (ref >60)
Glucose, Bld: 135 mg/dL — ABNORMAL HIGH (ref 70–99)
Potassium: 4 mmol/L (ref 3.5–5.1)
Sodium: 136 mmol/L (ref 135–145)
Total Bilirubin: 0.5 mg/dL (ref 0.0–1.2)
Total Protein: 8 g/dL (ref 6.5–8.1)

## 2024-03-17 MED ORDER — SODIUM CHLORIDE 0.9 % IV SOLN
INTRAVENOUS | Status: DC
  Filled 2024-03-17: qty 250

## 2024-03-17 MED ORDER — PROCHLORPERAZINE MALEATE 10 MG PO TABS
10.0000 mg | ORAL_TABLET | Freq: Once | ORAL | Status: AC
  Administered 2024-03-17: 10 mg via ORAL
  Filled 2024-03-17: qty 1

## 2024-03-17 MED ORDER — SODIUM CHLORIDE 0.9 % IV SOLN
900.0000 mg/m2 | Freq: Once | INTRAVENOUS | Status: AC
  Administered 2024-03-17: 2090 mg via INTRAVENOUS
  Filled 2024-03-17: qty 54.97

## 2024-03-17 MED ORDER — HEPARIN SOD (PORK) LOCK FLUSH 100 UNIT/ML IV SOLN
500.0000 [IU] | Freq: Once | INTRAVENOUS | Status: AC | PRN
  Administered 2024-03-17: 500 [IU]
  Filled 2024-03-17: qty 5

## 2024-03-17 NOTE — Assessment & Plan Note (Signed)
 Atrial flutter: with RVR. S/p TEE & cardioverison on 12/28/23  follow-up with cardiology.  Continue metoprolol  and Eliquis 

## 2024-03-17 NOTE — Telephone Encounter (Signed)
 Made patient aware that I have received the lease agreement, however, I need a completed W-9 from her landlord.  I have e-mailed the form to her landlord, but it has not been returned.  I have also left multiple messages for her landlord to call me but have not received a return phone call.  I contacted patient to make aware.  Patient indicated that she is going to contact [employer] to find out why the W-9 form has not been completed.  [name] Kristina Cruz Patient Services Navigator [facility]

## 2024-03-17 NOTE — Patient Instructions (Signed)
 [facility] - A DEPT OF [facility]  Discharge Instructions: Thank you for choosing [facility] to provide your oncology and hematology care.  If you have a lab appointment with the Cancer Center, please go directly to the Cancer Center and check in at the registration area.  Wear comfortable clothing and clothing appropriate for easy access to any Portacath or PICC line.   We strive to give you quality time with your provider. You may need to reschedule your appointment if you arrive late (15 or more minutes).  Arriving late affects you and other patients whose appointments are after yours.  Also, if you miss three or more appointments without notifying the office, you may be dismissed from the clinic at the provider's discretion.      For prescription refill requests, have your pharmacy contact our office and allow 72 hours for refills to be completed.    Today you received the following chemotherapy and/or immunotherapy agents Gemzar      To help prevent nausea and vomiting after your treatment, we encourage you to take your nausea medication as directed.  BELOW ARE SYMPTOMS THAT SHOULD BE REPORTED IMMEDIATELY: *FEVER GREATER THAN 100.4 F (38 C) OR HIGHER *CHILLS OR SWEATING *NAUSEA AND VOMITING THAT IS NOT CONTROLLED WITH YOUR NAUSEA MEDICATION *UNUSUAL SHORTNESS OF BREATH *UNUSUAL BRUISING OR BLEEDING *URINARY PROBLEMS (pain or burning when urinating, or frequent urination) *BOWEL PROBLEMS (unusual diarrhea, constipation, pain near the anus) TENDERNESS IN MOUTH AND THROAT WITH OR WITHOUT PRESENCE OF ULCERS (sore throat, sores in mouth, or a toothache) UNUSUAL RASH, SWELLING OR PAIN  UNUSUAL VAGINAL DISCHARGE OR ITCHING   Items with * indicate a potential emergency and should be followed up as soon as possible or go to the Emergency Department if any problems should occur.  Please show the CHEMOTHERAPY ALERT CARD or IMMUNOTHERAPY ALERT  CARD at check-in to the Emergency Department and triage nurse.  Should you have questions after your visit or need to cancel or reschedule your appointment, please contact [facility] - A DEPT OF [facility]  [phone] and follow the prompts.  Office hours are 8:00 a.m. to 4:30 p.m. Monday - Friday. Please note that voicemails left after 4:00 p.m. may not be returned until the following business day.  We are closed weekends and major holidays. You have access to a nurse at all times for urgent questions. Please call the main number to the clinic [phone] and follow the prompts.  For any non-urgent questions, you may also contact your provider using MyChart. We now offer e-Visits for anyone 60 and older to request care online for non-urgent symptoms. For details visit [email or web address].   Also download the MyChart app! Go to the app store, search "MyChart", open the app, select [place], and log in with your MyChart username and password.

## 2024-03-17 NOTE — Progress Notes (Signed)
 [facility] CSW Progress Note  Visual merchandiser met with patient to follow-up on body donations.    Interventions: CSW assisted patient with filling out paperwork for body donation to [facility].  There were some answers patient needed to obtain and will bring with her during the next visit.      Follow Up Plan:  CSW will see patient on 8/14 at 9am.    Macario CHRISTELLA Au, LCSW Clinical Social Worker [facility]    Patient is participating in a Managed Medicaid Plan:  Yes

## 2024-03-17 NOTE — Assessment & Plan Note (Signed)
 Recommend gabapentin  100mg  TID PRN.

## 2024-03-17 NOTE — Assessment & Plan Note (Signed)
 Chemotherapy plan as listed above

## 2024-03-17 NOTE — Assessment & Plan Note (Signed)
 Likely secondary to chronically insufficiency/CHF.   I have referred to wound care.  Referred to vascular surgery.

## 2024-03-17 NOTE — Telephone Encounter (Signed)
 Received W-9 from [employer].  Have requested reimbursement using [employer] funds for rental assistance for patient.  [name] [name] Patient Services Navigator [facility]

## 2024-03-17 NOTE — Assessment & Plan Note (Signed)
 continue OxyContin  10 mg every 12 hours oxycodone  5 mg every 4-6 hours as needed -  .

## 2024-03-17 NOTE — Assessment & Plan Note (Addendum)
 Imaging findings and different lesion biopsy results were reviewed and discussed with patient. 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  Other differential diagnosis include GI tract adenocarcinoma, cholangiocarcinoma etc. CEA is elevated.  His CA 19-9 is normal. CT and MRI imaging is did not show any GI primary.I recommend a PET scan for further evaluation- patient rescheduled appt initially and later on PET scan was not done due to admission. I offered to call her daughter to give her updates and patient declines.   Labs are reviewed and discussed with patient. Patient did not tolerate Gemciabine and Abraxane  well.  On dose reduced monotherapy Gemcitaibine 900mg /m2 1 week on 1 week off  Proceed with cycle 3 D15, she tolerates well CEA is trending down. Repeat CT for evaluation of treatment response.  She has established care with palliative care service.

## 2024-03-17 NOTE — Progress Notes (Signed)
 Patient has no concerns today.

## 2024-03-17 NOTE — Progress Notes (Signed)
 Hematology/Oncology Progress note [phone] [phone]      CHIEF COMPLAINTS/PURPOSE OF CONSULTATION:  Stage IV pancreatic carcinoma with liver metastasis.  ASSESSMENT & PLAN:   Malignant neoplasm of pancreas [facility]) Imaging findings and different lesion biopsy results were reviewed and discussed with patient. 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  Other differential diagnosis include GI tract adenocarcinoma, cholangiocarcinoma etc. CEA is elevated.  His CA 19-9 is normal. CT and MRI imaging is did not show any GI primary.I recommend a PET scan for further evaluation- patient rescheduled appt initially and later on PET scan was not done due to admission. I offered to call her daughter to give her updates and patient declines.   Labs are reviewed and discussed with patient. Patient did not tolerate Gemciabine and Abraxane  well.  On dose reduced monotherapy Gemcitaibine 900mg /m2 1 week on 1 week off  Proceed with cycle 3 D15, she tolerates well CEA is trending down. Repeat CT for evaluation of treatment response.  She has established care with palliative care service.     Atrial fibrillation with RVR (HCC) Atrial flutter: with RVR. S/p TEE & cardioverison on 12/28/23  follow-up with cardiology.  Continue metoprolol  and Eliquis   Chemotherapy-induced neuropathy (HCC) Recommend gabapentin  100mg  TID PRN.    Encounter for antineoplastic chemotherapy Chemotherapy plan as listed above  Leg edema Likely secondary to chronically insufficiency/CHF.   I have referred to wound care.  Referred to vascular surgery.  Neoplasm related pain continue OxyContin  10 mg every 12 hours oxycodone  5 mg every 4-6 hours as needed -  .     Orders Placed This Encounter  Procedures   CT CHEST ABDOMEN PELVIS W CONTRAST    Standing Status:   Future    Expected Date:   03/24/2024    Expiration Date:   03/17/2025    If indicated  for the ordered procedure, I authorize the administration of contrast media per Radiology protocol:   Yes    Does the patient have a contrast media/X-ray dye allergy?:   No    Preferred imaging location?:   [facility]    If indicated for the ordered procedure, I authorize the administration of oral contrast media per Radiology protocol:   Yes   Follow-up 2 weeks All questions were answered. The patient knows to call the clinic with any problems, questions or concerns.  [name], MD, PhD [facility] Hematology Oncology 03/17/2024    HISTORY OF PRESENTING ILLNESS:  Kristina Cruz 63 y.o. female presents to establish care for Stage IV pancreatic cancer I have reviewed her chart and materials related to her cancer extensively and collaborated history with the patient. Summary of oncologic history is as follows: Oncology History  Malignant neoplasm of pancreas (HCC)  08/21/2023 Imaging   CT abdomen pelvis with contrast showed 1. Masslike thickening involving the proximal and mid tail of pancreas with relative hypoenhancement measures approximately 3.9 x 5.5 cm. In the absence of signs/symptoms of acute pancreatitis underlying neoplastic process cannot be excluded. Further evaluation with nonemergent contrast enhanced MRI of the abdomen is advised. Note: Given patient's body habitus and the motion artifact observed on the current exam an MRI obtained at this time is likely to be severely limited and likely nondiagnostic. MRI should be obtained only once the patient is clinically stable, and is able to remain motionless and breath hold. 2. Patchy ground-glass and airspace densities identified within both lower lobes. These are new compared with the  previous exam and are concerning for underlying inflammatory/infectious process. 3. Sigmoid diverticulosis without signs of acute diverticulitis. 4. Marked diastasis recti with ventral herniation of the large and small bowel loops. 5.  Periumbilical hernia is containing fat only.   08/24/2023 Imaging   MRI abdomen wo contrast  1. Moderately suboptimal unenhanced exam. 2. Heterogeneous masslike thickening of the pancreatic body/tail again seen. The lesion remains indeterminate on this exam. Differential diagnosis includes pancreatic tumor, autoimmune pancreatitis, mass forming chronic pancreatitis, etc. Please see above for follow-up recommendations. 3. Mild diffuse hepatic steatosis. There are indeterminate areas in the liver, which can also be better evaluated on the contrast-enhanced MRI abdomen. 4. Other observations, as described above.   11/05/2023 Initial Diagnosis   Pancreatic cancer metastasized to liver  Patient was hospitalized from 11/05/2023 - 11/10/2023 She presented to emergency room due to nausea, vomiting and abdominal pain. CT scan showed multiple hypodense hepatic lesions increasing number and size from prior imaging.  Pancreatic tail mass, hypodense lesion in the anterior spleen.  11/09/2023 ultrasound-guided biopsy was obtained. Pathology showed moderately to poorly differentiated adenocarcinoma with extensive areas of tumor necrosis. The adenocarcinoma is positive for cytokeratin 7 and CDX2 (focal).     Cytokeratin 20, TTF-1, and GATA3 are negative. The pattern of immunoreactivity is not specific and the differential diagnosis includes metastatic adenocarcinoma from the pancreaticobiliary tract and upper GI. Intrahepatic       cholangiocarcinoma is also a diagnostic possibility. Given the radiographic  findings a pancreaticobiliary primary is favored and correlation with clinical impression is required.    11/17/2023 Cancer Staging   Staging form: Exocrine Pancreas, AJCC 8th Edition - Clinical stage from 11/17/2023: Stage IV (cT3, cNX, pM1) - Signed by Babara Call, MD on 11/17/2023 Stage prefix: Initial diagnosis    Imaging   CT abdomen pelvis w contrast  1. Multiple hypodense hepatic lesions, increased in  number and size from prior imaging, highly suspicious for metastatic disease. 2. Hypodense lesion in the pancreatic tail measures 7.2 x 3.9 cm, increased in size from prior. The increase in size favors pancreatic neoplasm, with additional etiologies less likely. 3. New hypodense lesion in the anterior spleen, 13 mm, suspicious for metastatic disease. 4. Laxity of the anterior abdominal wall with midline ventral abdominal wall hernia containing small and to a lesser extent large bowel. No bowel obstruction or inflammation. 5. Colonic diverticulosis without diverticulitis.   Aortic Atherosclerosis (ICD10-I70.0)    11/27/2023 -  Chemotherapy   Patient is on Treatment Plan : PANCREATIC Abraxane  D1,8,15 + Gemcitabine  D1,8,15 q28d      Hospitalization due to Atrial flutter: with RVR. S/p TEE & cardioverison on 12/28/23, severe sepsis, gastroenteritis.  01/30/2024 - 01/31/2024 patient was hospitalized due to CHF exacerbation, she also received antibiotics for E. coli UTI.  Today patient denies any nausea vomiting diarrhea.  No fever or chills.  She feels well. Tolerated gemcitabine  monotherapy well.    chronic bilateral lower extremity edema, skin breakdown.    MEDICAL HISTORY:  Past Medical History:  Diagnosis Date   Anemia    Asthma    Back pain    Cataract    Diabetes mellitus without complication (HCC)    GERD (gastroesophageal reflux disease)    History of echocardiogram    Hypertension    Morbid obesity with BMI of 60.0-69.9, adult (HCC)    Restless leg syndrome     SURGICAL HISTORY: Past Surgical History:  Procedure Laterality Date   ABDOMINAL HYSTERECTOMY     CARDIOVERSION N/A  12/28/2023   Procedure: CARDIOVERSION;  Surgeon: Darliss Rogue, MD;  Location: [facility] ORS;  Service: Cardiovascular;  Laterality: N/A;   CATARACT EXTRACTION W/PHACO Left 04/13/2020   Procedure: CATARACT EXTRACTION PHACO AND INTRAOCULAR LENS PLACEMENT (IOC);  Surgeon: Ferol Rogue, MD;  Location:  [facility] ORS;  Service: Ophthalmology;  Laterality: Left;  US  00:36.0 CDE 5.95 Fluid Pack lot # [id number]   HYSTEROSCOPY WITH D & C N/A 07/14/2017   Procedure: DILATATION AND CURETTAGE /HYSTEROSCOPY;  Surgeon: Arloa Lamar SQUIBB, MD;  Location: [facility] ORS;  Service: Gynecology;  Laterality: N/A;   IR IMAGING GUIDED PORT INSERTION  12/17/2023   POLYPECTOMY  2015   TEE WITHOUT CARDIOVERSION N/A 12/28/2023   Procedure: ECHOCARDIOGRAM, TRANSESOPHAGEAL;  Surgeon: Darliss Rogue, MD;  Location: [facility] ORS;  Service: Cardiovascular;  Laterality: N/A;    SOCIAL HISTORY: Social History   Socioeconomic History   Marital status: Single    Spouse name: Not on file   Number of children: Not on file   Years of education: Not on file   Highest education level: Not on file  Occupational History   Not on file  Tobacco Use   Smoking status: Never   Smokeless tobacco: Never  Vaping Use   Vaping status: Never Used  Substance and Sexual Activity   Alcohol use: No   Drug use: No   Sexual activity: Never    Birth control/protection: None  Other Topics Concern   Not on file  Social History Narrative   Not on file   Social Drivers of Health   Financial Resource Strain: Low Risk  (09/26/2022)   [facility]   Overall Financial Resource Strain (CARDIA)    Difficulty of Paying Living Expenses: Not hard at all  Food Insecurity: Patient Declined (01/30/2024)   Hunger Vital Sign    Worried About Running Out of Food in the Last Year: Patient declined    Ran Out of Food in the Last Year: Patient declined  Transportation Needs: Patient Declined (01/30/2024)   PRAPARE - Administrator, Civil Service (Medical): Patient declined    Lack of Transportation (Non-Medical): Patient declined  Physical Activity: Not on file  Stress: Not on file  Social Connections: Socially Isolated (12/25/2023)   Social Connection and Isolation Panel    Frequency of Communication with Friends  and Family: More than three times a week    Frequency of Social Gatherings with Friends and Family: More than three times a week    Attends Religious Services: Never    Database administrator or Organizations: No    Attends Banker Meetings: Never    Marital Status: Never married  Intimate Partner Violence: Patient Declined (01/30/2024)   Humiliation, Afraid, Rape, and Kick questionnaire    Fear of Current or Ex-Partner: Patient declined    Emotionally Abused: Patient declined    Physically Abused: Patient declined    Sexually Abused: Patient declined    FAMILY HISTORY: Family History  Problem Relation Age of Onset   Breast cancer Mother 15   Diabetes Mother    Hypertension Mother    Ovarian cancer Paternal Aunt        ?   Diabetes Father    Hypertension Father     ALLERGIES:  is allergic to peanut-containing drug products.  MEDICATIONS:  Current Outpatient Medications  Medication Sig Dispense Refill   acetaminophen  (TYLENOL  8 HOUR ARTHRITIS PAIN) 650 MG CR tablet Take 3 tablets (1,950 mg total) by  mouth every 8 (eight) hours as needed for pain. Do not exceed 4000 mg total in a day.  Home med.     apixaban  (ELIQUIS ) 5 MG TABS tablet Take 1 tablet (5 mg total) by mouth 2 (two) times daily. 60 tablet 0   azelastine  (ASTELIN ) 0.1 % nasal spray Place 1 spray into both nostrils 2 (two) times daily.     baclofen  (LIORESAL ) 10 MG tablet Take 10 mg by mouth 3 (three) times daily.     diphenoxylate -atropine  (LOMOTIL ) 2.5-0.025 MG tablet Take 2 tablets by mouth every 6 (six) hours as needed (for diarrhea unrelieved by imodium). 90 tablet 0   EPINEPHrine  0.3 mg/0.3 mL IJ SOAJ injection Inject 0.3 mg into the muscle as needed for anaphylaxis. 1 each 0   fluticasone  (FLONASE ) 50 MCG/ACT nasal spray Place 1 spray into both nostrils daily. 9.9 mL 0   fluticasone  furoate-vilanterol (BREO ELLIPTA ) 100-25 MCG/ACT AEPB Inhale 1 puff into the lungs daily. 28 each 1   furosemide  (LASIX )  40 MG tablet Take 40 mg by mouth daily.     gabapentin  (NEURONTIN ) 100 MG capsule Take 1 capsule (100 mg total) by mouth 3 (three) times daily. 60 capsule 0   ipratropium-albuterol  (DUONEB) 0.5-2.5 (3) MG/3ML SOLN Take 3 mLs by nebulization every 4 (four) hours as needed (wheezing / shortness of breath). 360 mL 1   levocetirizine (XYZAL ) 5 MG tablet Take 5 mg by mouth daily.     lidocaine -prilocaine  (EMLA ) cream Apply 1 Application topically as needed. Apply to port and cover with saran wrap 1-2 hours prior to port access 30 g 1   magic mouthwash (lidocaine , diphenhydrAMINE , alum & mag hydroxide) suspension Swish and swallow 5 mLs 4 (four) times daily as needed for mouth pain. 360 mL 1   metFORMIN  (GLUCOPHAGE ) 1000 MG tablet Take 1,000 mg by mouth 2 (two) times daily.     metoprolol  tartrate (LOPRESSOR ) 25 MG tablet Take 1 tablet (25 mg total) by mouth 2 (two) times daily. 60 tablet 0   morphine  (MS CONTIN ) 15 MG 12 hr tablet Take 1 tablet (15 mg total) by mouth every 12 (twelve) hours. 30 tablet 0   naloxone  (NARCAN ) nasal spray 4 mg/0.1 mL SPRAY 1 SPRAY INTO ONE NOSTRIL AS DIRECTED FOR OPIOID OVERDOSE (TURN PERSON ON SIDE AFTER DOSE. IF NO RESPONSE IN 2-3 MINUTES OR PERSON RESPONDS BUT RELAPSES, REPEAT USING A NEW SPRAY DEVICE AND SPRAY INTO THE OTHER NOSTRIL. CALL 911 AFTER USE.) * EMERGENCY USE ONLY * 1 each 0   ondansetron  (ZOFRAN -ODT) 4 MG disintegrating tablet Take 1 tablet (4 mg total) by mouth every 8 (eight) hours as needed for nausea or vomiting. 45 tablet 0   oxyCODONE  (OXY IR/ROXICODONE ) 5 MG immediate release tablet Take 1 tablet (5 mg total) by mouth every 4 (four) hours as needed for moderate pain (pain score 4-6) or severe pain (pain score 7-10). 45 tablet 0   prochlorperazine  (COMPAZINE ) 10 MG tablet Take 1 tablet (10 mg total) by mouth every 6 (six) hours as needed for nausea or vomiting. 30 tablet 1   rOPINIRole  (REQUIP ) 2 MG tablet Take 2 mg by mouth in the morning and at bedtime.       spironolactone  (ALDACTONE ) 50 MG tablet Take 1 tablet (50 mg total) by mouth daily. 30 tablet 1   traZODone  (DESYREL ) 50 MG tablet Take 0.5-1 tablets (25-50 mg total) by mouth at bedtime as needed for sleep. 30 tablet 2   VENTOLIN  HFA 108 (90 Base)  MCG/ACT inhaler Inhale 2 puffs into the lungs every 4 (four) hours as needed for wheezing or shortness of breath. 18 g 1   No current facility-administered medications for this visit.   Facility-Administered Medications Ordered in Other Visits  Medication Dose Route Frequency Provider Last Rate Last Admin   0.9 %  sodium chloride  infusion   Intravenous Continuous Babara Call, MD   Stopped at 03/17/24 1052    Review of Systems  Constitutional:  Positive for fatigue. Negative for appetite change, chills and fever.  HENT:   Negative for hearing loss and voice change.   Eyes:  Negative for eye problems.  Respiratory:  Negative for chest tightness, cough and shortness of breath.   Cardiovascular:  Positive for leg swelling. Negative for chest pain.  Gastrointestinal:  Positive for nausea. Negative for abdominal distention, abdominal pain and blood in stool.  Endocrine: Negative for hot flashes.  Genitourinary:  Negative for difficulty urinating and frequency.   Musculoskeletal:  Negative for arthralgias.  Skin:  Negative for itching and rash.  Neurological:  Negative for extremity weakness.  Hematological:  Negative for adenopathy.  Psychiatric/Behavioral:  Negative for confusion.      PHYSICAL EXAMINATION: ECOG PERFORMANCE STATUS: 1 - Symptomatic but completely ambulatory  Vitals:   03/17/24 0908  BP: (!) 161/84  Pulse: 70  Resp: 19  Temp: (!) 96.6 F (35.9 C)  SpO2: 99%   Filed Weights   03/17/24 0908  Weight: 261 lb 4.8 oz (118.5 kg)     Physical Exam Constitutional:      General: She is not in acute distress.    Appearance: She is obese. She is not diaphoretic.  HENT:     Head: Normocephalic and atraumatic.  Eyes:      General: No scleral icterus. Cardiovascular:     Rate and Rhythm: Normal rate and regular rhythm.  Pulmonary:     Effort: Pulmonary effort is normal. No respiratory distress.     Breath sounds: No wheezing or rales.     Comments: Decreased breath sound bilaterally Abdominal:     General: Bowel sounds are normal. There is no distension.     Palpations: Abdomen is soft.  Musculoskeletal:        General: Swelling present. Normal range of motion.     Cervical back: Normal range of motion and neck supple.     Right lower leg: Edema present.     Left lower leg: Edema present.  Skin:    General: Skin is warm and dry.     Findings: No erythema.  Neurological:     Mental Status: She is alert and oriented to person, place, and time. Mental status is at baseline.     Motor: No abnormal muscle tone.  Psychiatric:        Mood and Affect: Mood and affect normal.      LABORATORY DATA:  I have reviewed the data as listed    Latest Ref Rng & Units 03/17/2024    8:48 AM 03/03/2024    9:12 AM 02/15/2024    8:55 AM  CBC  WBC 4.0 - 10.5 K/uL 6.3  5.7  5.4   Hemoglobin 12.0 - 15.0 g/dL 88.7  89.5  89.6   Hematocrit 36.0 - 46.0 % 33.4  31.5  31.2   Platelets 150 - 400 K/uL 254  402  288       Latest Ref Rng & Units 03/17/2024    8:48 AM 03/03/2024    9:12  AM 02/15/2024    8:55 AM  CMP  Glucose 70 - 99 mg/dL 864  851  839   BUN 8 - 23 mg/dL 14  11  9    Creatinine 0.44 - 1.00 mg/dL 9.17  9.13  9.16   Sodium 135 - 145 mmol/L 136  135  135   Potassium 3.5 - 5.1 mmol/L 4.0  3.4  3.7   Chloride 98 - 111 mmol/L 103  102  102   CO2 22 - 32 mmol/L 25  24  25    Calcium  8.9 - 10.3 mg/dL 8.8  8.6  8.6   Total Protein 6.5 - 8.1 g/dL 8.0  7.5  7.6   Total Bilirubin 0.0 - 1.2 mg/dL 0.5  0.6  0.6   Alkaline Phos 38 - 126 U/L 136  134  126   AST 15 - 41 U/L 19  21  23    ALT 0 - 44 U/L 12  14  20       RADIOGRAPHIC STUDIES: I have personally reviewed the radiological images as listed and agreed with  the findings in the report. No results found.

## 2024-03-18 ENCOUNTER — Ambulatory Visit: Admitting: Physician Assistant

## 2024-03-25 ENCOUNTER — Ambulatory Visit: Attending: Oncology

## 2024-03-28 ENCOUNTER — Encounter: Payer: Self-pay | Admitting: Oncology

## 2024-03-31 ENCOUNTER — Inpatient Hospital Stay

## 2024-03-31 ENCOUNTER — Inpatient Hospital Stay: Attending: Oncology

## 2024-03-31 ENCOUNTER — Inpatient Hospital Stay (HOSPITAL_BASED_OUTPATIENT_CLINIC_OR_DEPARTMENT_OTHER): Attending: Oncology | Admitting: Oncology

## 2024-03-31 ENCOUNTER — Encounter: Payer: Self-pay | Admitting: Oncology

## 2024-03-31 VITALS — BP 149/77 | HR 74 | Temp 96.7°F | Resp 18 | Wt 257.6 lb

## 2024-03-31 DIAGNOSIS — Z803 Family history of malignant neoplasm of breast: Secondary | ICD-10-CM | POA: Diagnosis not present

## 2024-03-31 DIAGNOSIS — G620 Drug-induced polyneuropathy: Secondary | ICD-10-CM | POA: Insufficient documentation

## 2024-03-31 DIAGNOSIS — C787 Secondary malignant neoplasm of liver and intrahepatic bile duct: Secondary | ICD-10-CM | POA: Insufficient documentation

## 2024-03-31 DIAGNOSIS — C252 Malignant neoplasm of tail of pancreas: Secondary | ICD-10-CM | POA: Diagnosis not present

## 2024-03-31 DIAGNOSIS — I4891 Unspecified atrial fibrillation: Secondary | ICD-10-CM

## 2024-03-31 DIAGNOSIS — G893 Neoplasm related pain (acute) (chronic): Secondary | ICD-10-CM | POA: Insufficient documentation

## 2024-03-31 DIAGNOSIS — T451X5A Adverse effect of antineoplastic and immunosuppressive drugs, initial encounter: Secondary | ICD-10-CM | POA: Insufficient documentation

## 2024-03-31 DIAGNOSIS — Z7901 Long term (current) use of anticoagulants: Secondary | ICD-10-CM | POA: Diagnosis not present

## 2024-03-31 DIAGNOSIS — R970 Elevated carcinoembryonic antigen [CEA]: Secondary | ICD-10-CM | POA: Diagnosis not present

## 2024-03-31 DIAGNOSIS — C259 Malignant neoplasm of pancreas, unspecified: Secondary | ICD-10-CM

## 2024-03-31 DIAGNOSIS — Z5111 Encounter for antineoplastic chemotherapy: Secondary | ICD-10-CM

## 2024-03-31 LAB — CMP (CANCER CENTER ONLY)
ALT: 16 U/L (ref 0–44)
AST: 20 U/L (ref 15–41)
Albumin: 3.5 g/dL (ref 3.5–5.0)
Alkaline Phosphatase: 140 U/L — ABNORMAL HIGH (ref 38–126)
Anion gap: 8 (ref 5–15)
BUN: 18 mg/dL (ref 8–23)
CO2: 27 mmol/L (ref 22–32)
Calcium: 9.2 mg/dL (ref 8.9–10.3)
Chloride: 100 mmol/L (ref 98–111)
Creatinine: 0.86 mg/dL (ref 0.44–1.00)
GFR, Estimated: >60 mL/min (ref >60)
Glucose, Bld: 170 mg/dL — ABNORMAL HIGH (ref 70–99)
Potassium: 3.6 mmol/L (ref 3.5–5.1)
Sodium: 135 mmol/L (ref 135–145)
Total Bilirubin: 0.7 mg/dL (ref 0.0–1.2)
Total Protein: 8.1 g/dL (ref 6.5–8.1)

## 2024-03-31 LAB — CBC WITH DIFFERENTIAL (CANCER CENTER ONLY)
Abs Immature Granulocytes: 0.01 K/uL (ref 0.00–0.07)
Basophils Absolute: 0 K/uL (ref 0.0–0.1)
Basophils Relative: 1 %
Eosinophils Absolute: 0.3 K/uL (ref 0.0–0.5)
Eosinophils Relative: 4 %
HCT: 35.1 % — ABNORMAL LOW (ref 36.0–46.0)
Hemoglobin: 11.7 g/dL — ABNORMAL LOW (ref 12.0–15.0)
Immature Granulocytes: 0 %
Lymphocytes Relative: 28 %
Lymphs Abs: 1.6 K/uL (ref 0.7–4.0)
MCH: 31.5 pg (ref 26.0–34.0)
MCHC: 33.3 g/dL (ref 30.0–36.0)
MCV: 94.6 fL (ref 80.0–100.0)
Monocytes Absolute: 0.8 K/uL (ref 0.1–1.0)
Monocytes Relative: 14 %
Neutro Abs: 3.1 K/uL (ref 1.7–7.7)
Neutrophils Relative %: 53 %
Platelet Count: 296 K/uL (ref 150–400)
RBC: 3.71 MIL/uL — ABNORMAL LOW (ref 3.87–5.11)
RDW: 17.4 % — ABNORMAL HIGH (ref 11.5–15.5)
WBC Count: 5.8 K/uL (ref 4.0–10.5)
nRBC: 0 % (ref 0.0–0.2)

## 2024-03-31 MED ORDER — SODIUM CHLORIDE 0.9 % IV SOLN
INTRAVENOUS | Status: DC
  Filled 2024-03-31: qty 250

## 2024-03-31 MED ORDER — SODIUM CHLORIDE 0.9 % IV SOLN
900.0000 mg/m2 | Freq: Once | INTRAVENOUS | Status: AC
  Administered 2024-03-31: 2090 mg via INTRAVENOUS
  Filled 2024-03-31: qty 54.97

## 2024-03-31 MED ORDER — PROCHLORPERAZINE MALEATE 10 MG PO TABS
10.0000 mg | ORAL_TABLET | Freq: Once | ORAL | Status: AC
  Administered 2024-03-31: 10 mg via ORAL
  Filled 2024-03-31: qty 1

## 2024-03-31 NOTE — Progress Notes (Signed)
 [facility] CSW Progress Note  Visual merchandiser met with patient to follow-up on Anatomical Gift form..    Interventions: Provided patient with information about completing her Anatomical Gift form for [facility].  CSW was informed by [employer] that the application is not a legal document and that anyone could witness patient signature.  Was able to obtain signatures and assisted patient in completing the document.  Made patient a copy and mailed the forms per her request.  Also obtained a Visual merchandiser gift card from [employer]..       Follow Up Plan:  CSW will follow-up with patient by phone     Macario CHRISTELLA Au, LCSW Clinical Social Worker [facility]    Patient is participating in a Managed Medicaid Plan:  Yes

## 2024-03-31 NOTE — Assessment & Plan Note (Signed)
 Recommend gabapentin  100mg  TID PRN.

## 2024-03-31 NOTE — Patient Instructions (Signed)
 [facility] - A DEPT OF [facility]  Discharge Instructions: Thank you for choosing [facility] to provide your oncology and hematology care.  If you have a lab appointment with the Cancer Center, please go directly to the Cancer Center and check in at the registration area.  Wear comfortable clothing and clothing appropriate for easy access to any Portacath or PICC line.   We strive to give you quality time with your provider. You may need to reschedule your appointment if you arrive late (15 or more minutes).  Arriving late affects you and other patients whose appointments are after yours.  Also, if you miss three or more appointments without notifying the office, you may be dismissed from the clinic at the provider's discretion.      For prescription refill requests, have your pharmacy contact our office and allow 72 hours for refills to be completed.    Today you received the following chemotherapy and/or immunotherapy agents GEMZAR      To help prevent nausea and vomiting after your treatment, we encourage you to take your nausea medication as directed.  BELOW ARE SYMPTOMS THAT SHOULD BE REPORTED IMMEDIATELY: *FEVER GREATER THAN 100.4 F (38 C) OR HIGHER *CHILLS OR SWEATING *NAUSEA AND VOMITING THAT IS NOT CONTROLLED WITH YOUR NAUSEA MEDICATION *UNUSUAL SHORTNESS OF BREATH *UNUSUAL BRUISING OR BLEEDING *URINARY PROBLEMS (pain or burning when urinating, or frequent urination) *BOWEL PROBLEMS (unusual diarrhea, constipation, pain near the anus) TENDERNESS IN MOUTH AND THROAT WITH OR WITHOUT PRESENCE OF ULCERS (sore throat, sores in mouth, or a toothache) UNUSUAL RASH, SWELLING OR PAIN  UNUSUAL VAGINAL DISCHARGE OR ITCHING   Items with * indicate a potential emergency and should be followed up as soon as possible or go to the Emergency Department if any problems should occur.  Please show the CHEMOTHERAPY ALERT CARD or IMMUNOTHERAPY ALERT  CARD at check-in to the Emergency Department and triage nurse.  Should you have questions after your visit or need to cancel or reschedule your appointment, please contact [facility] - A DEPT OF [facility]  [phone] and follow the prompts.  Office hours are 8:00 a.m. to 4:30 p.m. Monday - Friday. Please note that voicemails left after 4:00 p.m. may not be returned until the following business day.  We are closed weekends and major holidays. You have access to a nurse at all times for urgent questions. Please call the main number to the clinic [phone] and follow the prompts.  For any non-urgent questions, you may also contact your provider using MyChart. We now offer e-Visits for anyone 58 and older to request care online for non-urgent symptoms. For details visit [email or web address].   Also download the MyChart app! Go to the app store, search "MyChart", open the app, select [place], and log in with your MyChart username and password.  Gemcitabine Injection What is this medication? GEMCITABINE (jem SYE ta been) treats some types of cancer. It works by slowing down the growth of cancer cells. This medicine may be used for other purposes; ask your health care provider or pharmacist if you have questions. COMMON BRAND NAME(S): Gemzar, Infugem What should I tell my care team before I take this medication? They need to know if you have any of these conditions: Blood disorders Infection Kidney disease Liver disease Lung or breathing disease, such as asthma or COPD Recent or ongoing radiation therapy An unusual or allergic  reaction to gemcitabine, other medications, foods, dyes, or preservatives If you or your partner are pregnant or trying to get pregnant Breast-feeding How should I use this medication? This medication is injected into a vein. It is given by your care team in a hospital or clinic setting. Talk to your care team about the use of  this medication in children. Special care may be needed. Overdosage: If you think you have taken too much of this medicine contact a poison control center or emergency room at once. NOTE: This medicine is only for you. Do not share this medicine with others. What if I miss a dose? Keep appointments for follow-up doses. It is important not to miss your dose. Call your care team if you are unable to keep an appointment. What may interact with this medication? Interactions have not been studied. This list may not describe all possible interactions. Give your health care provider a list of all the medicines, herbs, non-prescription drugs, or dietary supplements you use. Also tell them if you smoke, drink alcohol, or use illegal drugs. Some items may interact with your medicine. What should I watch for while using this medication? Your condition will be monitored carefully while you are receiving this medication. This medication may make you feel generally unwell. This is not uncommon, as chemotherapy can affect healthy cells as well as cancer cells. Report any side effects. Continue your course of treatment even though you feel ill unless your care team tells you to stop. In some cases, you may be given additional medications to help with side effects. Follow all directions for their use. This medication may increase your risk of getting an infection. Call your care team for advice if you get a fever, chills, sore throat, or other symptoms of a cold or flu. Do not treat yourself. Try to avoid being around people who are sick. This medication may increase your risk to bruise or bleed. Call your care team if you notice any unusual bleeding. Be careful brushing or flossing your teeth or using a toothpick because you may get an infection or bleed more easily. If you have any dental work done, tell your dentist you are receiving this medication. Avoid taking medications that contain aspirin, acetaminophen,  ibuprofen, naproxen, or ketoprofen unless instructed by your care team. These medications may hide a fever. Talk to your care team if you or your partner wish to become pregnant or think you might be pregnant. This medication can cause serious birth defects if taken during pregnancy and for 6 months after the last dose. A negative pregnancy test is required before starting this medication. A reliable form of contraception is recommended while taking this medication and for 6 months after the last dose. Talk to your care team about effective forms of contraception. Do not father a child while taking this medication and for 3 months after the last dose. Use a condom while having sex during this time period. Do not breastfeed while taking this medication and for at least 1 week after the last dose. This medication may cause infertility. Talk to your care team if you are concerned about your fertility. What side effects may I notice from receiving this medication? Side effects that you should report to your care team as soon as possible: Allergic reactions--skin rash, itching, hives, swelling of the face, lips, tongue, or throat Capillary leak syndrome--stomach or muscle pain, unusual weakness or fatigue, feeling faint or lightheaded, decrease in the amount of urine, swelling of  the ankles, hands, or feet, trouble breathing Infection--fever, chills, cough, sore throat, wounds that don't heal, pain or trouble when passing urine, general feeling of discomfort or being unwell Liver injury--right upper belly pain, loss of appetite, nausea, light-colored stool, dark yellow or brown urine, yellowing skin or eyes, unusual weakness or fatigue Low red blood cell level--unusual weakness or fatigue, dizziness, headache, trouble breathing Lung injury--shortness of breath or trouble breathing, cough, spitting up blood, chest pain, fever Stomach pain, bloody diarrhea, pale skin, unusual weakness or fatigue, decrease in  the amount of urine, which may be signs of hemolytic uremic syndrome Sudden and severe headache, confusion, change in vision, seizures, which may be signs of posterior reversible encephalopathy syndrome (PRES) Unusual bruising or bleeding Side effects that usually do not require medical attention (report to your care team if they continue or are bothersome): Diarrhea Drowsiness Hair loss Nausea Pain, redness, or swelling with sores inside the mouth or throat Vomiting This list may not describe all possible side effects. Call your doctor for medical advice about side effects. You may report side effects to FDA at 1-800-FDA-1088. Where should I keep my medication? This medication is given in a hospital or clinic. It will not be stored at home. NOTE: This sheet is a summary. It may not cover all possible information. If you have questions about this medicine, talk to your doctor, pharmacist, or health care provider.  2024 Elsevier/Gold Standard (2021-12-10 00:00:00)

## 2024-03-31 NOTE — Progress Notes (Signed)
 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  ASSESSMENT & PLAN:   Malignant neoplasm of pancreas (HCC) stage IV pancreatic cancer with liver metastasis was discussed with patient.  Other differential diagnosis include GI tract adenocarcinoma, cholangiocarcinoma etc. Baseline CEA is elevated.  CA 19-9 is normal. CT and MRI imaging is did not show any GI primary.I recommend a PET scan for further evaluation- patient rescheduled appt initially and later on PET scan was not done due to admission. I offered to call her daughter to give her updates and patient declines.   Labs are reviewed and discussed with patient. Patient did not tolerate Gemciabine and Abraxane  well.  On dose reduced monotherapy Gemcitaibine 900mg /m2 1 week on 1 week off  Proceed with cycle 3 D15 gemcitabine , she tolerates well CEA is trending down. Repeat CT for evaluation of treatment response.  She has established care with palliative care service.     Atrial fibrillation with RVR (HCC) Atrial flutter: with RVR. S/p TEE & cardioverison on 12/28/23  follow-up with cardiology.  Continue metoprolol  and Eliquis   Chemotherapy-induced neuropathy (HCC) Recommend gabapentin  100mg  TID PRN.    Encounter for antineoplastic chemotherapy Chemotherapy plan as listed above  Neoplasm related pain continue OxyContin  10 mg every 12 hours oxycodone  5 mg every 4-6 hours as needed -  .     Orders Placed This Encounter  Procedures   Cancer antigen 19-9    Standing Status:   Future    Expected Date:   04/28/2024    Expiration Date:   04/28/2025   CEA    Standing Status:   Future    Expected Date:   04/28/2024    Expiration Date:   04/28/2025   CBC with Differential (Cancer Center Only)    Standing Status:   Future    Expected Date:   04/28/2024    Expiration Date:   04/28/2025   CMP  (Cancer Center only)    Standing Status:   Future    Expected Date:   04/28/2024    Expiration Date:   04/28/2025   CBC with Differential (Cancer Center Only)    Standing Status:   Future    Expected Date:   05/12/2024    Expiration Date:   05/12/2025   CMP (Cancer Center only)    Standing Status:   Future    Expected Date:   05/12/2024    Expiration Date:   05/12/2025   Follow-up 2 weeks All questions were answered. The patient knows to call the clinic with any problems, questions or concerns.  [name], MD, PhD [facility] Hematology Oncology 03/31/2024    HISTORY OF PRESENTING ILLNESS:  Kristina Cruz 63 y.o. female presents to establish care for Stage IV pancreatic cancer I have reviewed her chart and materials related to her cancer extensively and collaborated history with the patient. Summary of oncologic history is as follows: Oncology History  Malignant neoplasm of pancreas (HCC)  08/21/2023 Imaging   CT abdomen pelvis with contrast showed 1. Masslike thickening involving the proximal and mid tail of pancreas with relative hypoenhancement measures approximately 3.9 x 5.5 cm. In the absence of signs/symptoms of acute pancreatitis underlying neoplastic process cannot be excluded. Further evaluation with nonemergent contrast enhanced MRI of the abdomen is advised. Note: Given patient's body habitus and the motion artifact observed on the current exam an MRI obtained at this time is likely to  be severely limited and likely nondiagnostic. MRI should be obtained only once the patient is clinically stable, and is able to remain motionless and breath hold. 2. Patchy ground-glass and airspace densities identified within both lower lobes. These are new compared with the previous exam and are concerning for underlying inflammatory/infectious process. 3. Sigmoid diverticulosis without signs of acute diverticulitis. 4. Marked diastasis recti with ventral herniation of the large  and small bowel loops. 5. Periumbilical hernia is containing fat only.   08/24/2023 Imaging   MRI abdomen wo contrast  1. Moderately suboptimal unenhanced exam. 2. Heterogeneous masslike thickening of the pancreatic body/tail again seen. The lesion remains indeterminate on this exam. Differential diagnosis includes pancreatic tumor, autoimmune pancreatitis, mass forming chronic pancreatitis, etc. Please see above for follow-up recommendations. 3. Mild diffuse hepatic steatosis. There are indeterminate areas in the liver, which can also be better evaluated on the contrast-enhanced MRI abdomen. 4. Other observations, as described above.   11/05/2023 Initial Diagnosis   Pancreatic cancer metastasized to liver  Patient was hospitalized from 11/05/2023 - 11/10/2023 She presented to emergency room due to nausea, vomiting and abdominal pain. CT scan showed multiple hypodense hepatic lesions increasing number and size from prior imaging.  Pancreatic tail mass, hypodense lesion in the anterior spleen.  11/09/2023 ultrasound-guided biopsy was obtained. Pathology showed moderately to poorly differentiated adenocarcinoma with extensive areas of tumor necrosis. The adenocarcinoma is positive for cytokeratin 7 and CDX2 (focal).     Cytokeratin 20, TTF-1, and GATA3 are negative. The pattern of immunoreactivity is not specific and the differential diagnosis includes metastatic adenocarcinoma from the pancreaticobiliary tract and upper GI. Intrahepatic       cholangiocarcinoma is also a diagnostic possibility. Given the radiographic  findings a pancreaticobiliary primary is favored and correlation with clinical impression is required.    11/17/2023 Cancer Staging   Staging form: Exocrine Pancreas, AJCC 8th Edition - Clinical stage from 11/17/2023: Stage IV (cT3, cNX, pM1) - Signed by Babara Call, MD on 11/17/2023 Stage prefix: Initial diagnosis    Imaging   CT abdomen pelvis w contrast  1. Multiple hypodense  hepatic lesions, increased in number and size from prior imaging, highly suspicious for metastatic disease. 2. Hypodense lesion in the pancreatic tail measures 7.2 x 3.9 cm, increased in size from prior. The increase in size favors pancreatic neoplasm, with additional etiologies less likely. 3. New hypodense lesion in the anterior spleen, 13 mm, suspicious for metastatic disease. 4. Laxity of the anterior abdominal wall with midline ventral abdominal wall hernia containing small and to a lesser extent large bowel. No bowel obstruction or inflammation. 5. Colonic diverticulosis without diverticulitis.   Aortic Atherosclerosis (ICD10-I70.0)    11/27/2023 -  Chemotherapy   Patient is on Treatment Plan : PANCREATIC Abraxane  D1,8,15 + Gemcitabine  D1,8,15 q28d      Hospitalization due to Atrial flutter: with RVR. S/p TEE & cardioverison on 12/28/23, severe sepsis, gastroenteritis.  01/30/2024 - 01/31/2024 patient was hospitalized due to CHF exacerbation, she also received antibiotics for E. coli UTI.  Today patient denies any nausea vomiting diarrhea.  No fever or chills.  She feels well. Tolerated gemcitabine  monotherapy well.    chronic bilateral lower extremity edema, skin breakdown.    MEDICAL HISTORY:  Past Medical History:  Diagnosis Date   Anemia    Asthma    Back pain    Cataract    Diabetes mellitus without complication (HCC)    GERD (gastroesophageal reflux disease)    History of echocardiogram  Hypertension    Morbid obesity with BMI of 60.0-69.9, adult (HCC)    Restless leg syndrome     SURGICAL HISTORY: Past Surgical History:  Procedure Laterality Date   ABDOMINAL HYSTERECTOMY     CARDIOVERSION N/A 12/28/2023   Procedure: CARDIOVERSION;  Surgeon: Darliss Rogue, MD;  Location: [facility] ORS;  Service: Cardiovascular;  Laterality: N/A;   CATARACT EXTRACTION W/PHACO Left 04/13/2020   Procedure: CATARACT EXTRACTION PHACO AND INTRAOCULAR LENS PLACEMENT (IOC);  Surgeon:  Ferol Rogue, MD;  Location: [facility] ORS;  Service: Ophthalmology;  Laterality: Left;  US  00:36.0 CDE 5.95 Fluid Pack lot # [id number]   HYSTEROSCOPY WITH D & C N/A 07/14/2017   Procedure: DILATATION AND CURETTAGE /HYSTEROSCOPY;  Surgeon: Arloa Lamar SQUIBB, MD;  Location: [facility] ORS;  Service: Gynecology;  Laterality: N/A;   IR IMAGING GUIDED PORT INSERTION  12/17/2023   POLYPECTOMY  2015   TEE WITHOUT CARDIOVERSION N/A 12/28/2023   Procedure: ECHOCARDIOGRAM, TRANSESOPHAGEAL;  Surgeon: Darliss Rogue, MD;  Location: [facility] ORS;  Service: Cardiovascular;  Laterality: N/A;    SOCIAL HISTORY: Social History   Socioeconomic History   Marital status: Single    Spouse name: Not on file   Number of children: Not on file   Years of education: Not on file   Highest education level: Not on file  Occupational History   Not on file  Tobacco Use   Smoking status: Never   Smokeless tobacco: Never  Vaping Use   Vaping status: Never Used  Substance and Sexual Activity   Alcohol use: No   Drug use: No   Sexual activity: Never    Birth control/protection: None  Other Topics Concern   Not on file  Social History Narrative   Not on file   Social Drivers of Health   Financial Resource Strain: Low Risk  (09/26/2022)   Received from [facility]   Overall Financial Resource Strain (CARDIA)    Difficulty of Paying Living Expenses: Not hard at all  Food Insecurity: Patient Declined (01/30/2024)   Hunger Vital Sign    Worried About Running Out of Food in the Last Year: Patient declined    Ran Out of Food in the Last Year: Patient declined  Transportation Needs: Patient Declined (01/30/2024)   PRAPARE - Administrator, Civil Service (Medical): Patient declined    Lack of Transportation (Non-Medical): Patient declined  Physical Activity: Not on file  Stress: Not on file  Social Connections: Socially Isolated (12/25/2023)   Social Connection and Isolation Panel    Frequency of  Communication with Friends and Family: More than three times a week    Frequency of Social Gatherings with Friends and Family: More than three times a week    Attends Religious Services: Never    Database administrator or Organizations: No    Attends Banker Meetings: Never    Marital Status: Never married  Intimate Partner Violence: Patient Declined (01/30/2024)   Humiliation, Afraid, Rape, and Kick questionnaire    Fear of Current or Ex-Partner: Patient declined    Emotionally Abused: Patient declined    Physically Abused: Patient declined    Sexually Abused: Patient declined    FAMILY HISTORY: Family History  Problem Relation Age of Onset   Breast cancer Mother 60   Diabetes Mother    Hypertension Mother    Ovarian cancer Paternal Aunt        ?   Diabetes Father    Hypertension Father  ALLERGIES:  is allergic to peanut-containing drug products.  MEDICATIONS:  Current Outpatient Medications  Medication Sig Dispense Refill   acetaminophen  (TYLENOL  8 HOUR ARTHRITIS PAIN) 650 MG CR tablet Take 3 tablets (1,950 mg total) by mouth every 8 (eight) hours as needed for pain. Do not exceed 4000 mg total in a day.  Home med.     apixaban  (ELIQUIS ) 5 MG TABS tablet Take 1 tablet (5 mg total) by mouth 2 (two) times daily. 60 tablet 0   azelastine  (ASTELIN ) 0.1 % nasal spray Place 1 spray into both nostrils 2 (two) times daily.     baclofen  (LIORESAL ) 10 MG tablet Take 10 mg by mouth 3 (three) times daily.     diphenoxylate -atropine  (LOMOTIL ) 2.5-0.025 MG tablet Take 2 tablets by mouth every 6 (six) hours as needed (for diarrhea unrelieved by imodium). 90 tablet 0   EPINEPHrine  0.3 mg/0.3 mL IJ SOAJ injection Inject 0.3 mg into the muscle as needed for anaphylaxis. 1 each 0   fluticasone  (FLONASE ) 50 MCG/ACT nasal spray Place 1 spray into both nostrils daily. 9.9 mL 0   fluticasone  furoate-vilanterol (BREO ELLIPTA ) 100-25 MCG/ACT AEPB Inhale 1 puff into the lungs daily. 28  each 1   furosemide  (LASIX ) 40 MG tablet Take 40 mg by mouth daily.     gabapentin  (NEURONTIN ) 100 MG capsule Take 1 capsule (100 mg total) by mouth 3 (three) times daily. 60 capsule 0   ipratropium-albuterol  (DUONEB) 0.5-2.5 (3) MG/3ML SOLN Take 3 mLs by nebulization every 4 (four) hours as needed (wheezing / shortness of breath). 360 mL 1   levocetirizine (XYZAL ) 5 MG tablet Take 5 mg by mouth daily.     lidocaine -prilocaine  (EMLA ) cream Apply 1 Application topically as needed. Apply to port and cover with saran wrap 1-2 hours prior to port access 30 g 1   magic mouthwash (lidocaine , diphenhydrAMINE , alum & mag hydroxide) suspension Swish and swallow 5 mLs 4 (four) times daily as needed for mouth pain. 360 mL 1   metFORMIN  (GLUCOPHAGE ) 1000 MG tablet Take 1,000 mg by mouth 2 (two) times daily.     metoprolol  tartrate (LOPRESSOR ) 25 MG tablet Take 1 tablet (25 mg total) by mouth 2 (two) times daily. 60 tablet 0   morphine  (MS CONTIN ) 15 MG 12 hr tablet Take 1 tablet (15 mg total) by mouth every 12 (twelve) hours. 30 tablet 0   naloxone  (NARCAN ) nasal spray 4 mg/0.1 mL SPRAY 1 SPRAY INTO ONE NOSTRIL AS DIRECTED FOR OPIOID OVERDOSE (TURN PERSON ON SIDE AFTER DOSE. IF NO RESPONSE IN 2-3 MINUTES OR PERSON RESPONDS BUT RELAPSES, REPEAT USING A NEW SPRAY DEVICE AND SPRAY INTO THE OTHER NOSTRIL. CALL 911 AFTER USE.) * EMERGENCY USE ONLY * 1 each 0   ondansetron  (ZOFRAN -ODT) 4 MG disintegrating tablet Take 1 tablet (4 mg total) by mouth every 8 (eight) hours as needed for nausea or vomiting. 45 tablet 0   oxyCODONE  (OXY IR/ROXICODONE ) 5 MG immediate release tablet Take 1 tablet (5 mg total) by mouth every 4 (four) hours as needed for moderate pain (pain score 4-6) or severe pain (pain score 7-10). 45 tablet 0   prochlorperazine  (COMPAZINE ) 10 MG tablet Take 1 tablet (10 mg total) by mouth every 6 (six) hours as needed for nausea or vomiting. 30 tablet 1   rOPINIRole  (REQUIP ) 2 MG tablet Take 2 mg by mouth in  the morning and at bedtime.      spironolactone  (ALDACTONE ) 50 MG tablet Take 1 tablet (50 mg  total) by mouth daily. 30 tablet 1   traZODone  (DESYREL ) 50 MG tablet Take 0.5-1 tablets (25-50 mg total) by mouth at bedtime as needed for sleep. 30 tablet 2   VENTOLIN  HFA 108 (90 Base) MCG/ACT inhaler Inhale 2 puffs into the lungs every 4 (four) hours as needed for wheezing or shortness of breath. 18 g 1   No current facility-administered medications for this visit.   Facility-Administered Medications Ordered in Other Visits  Medication Dose Route Frequency Provider Last Rate Last Admin   0.9 %  sodium chloride  infusion   Intravenous Continuous Babara Call, MD   Stopped at 03/31/24 1107    Review of Systems  Constitutional:  Positive for fatigue. Negative for appetite change, chills and fever.  HENT:   Negative for hearing loss and voice change.   Eyes:  Negative for eye problems.  Respiratory:  Negative for chest tightness, cough and shortness of breath.   Cardiovascular:  Positive for leg swelling. Negative for chest pain.  Gastrointestinal:  Positive for nausea. Negative for abdominal distention, abdominal pain and blood in stool.  Endocrine: Negative for hot flashes.  Genitourinary:  Negative for difficulty urinating and frequency.   Musculoskeletal:  Negative for arthralgias.  Skin:  Negative for itching and rash.  Neurological:  Negative for extremity weakness.  Hematological:  Negative for adenopathy.  Psychiatric/Behavioral:  Negative for confusion.      PHYSICAL EXAMINATION: ECOG PERFORMANCE STATUS: 1 - Symptomatic but completely ambulatory  Vitals:   03/31/24 0851  BP: (!) 149/77  Pulse: 74  Resp: 18  Temp: (!) 96.7 F (35.9 C)  SpO2: 100%   Filed Weights   03/31/24 0851  Weight: 257 lb 9.6 oz (116.8 kg)     Physical Exam Constitutional:      General: She is not in acute distress.    Appearance: She is obese. She is not diaphoretic.  HENT:     Head: Normocephalic  and atraumatic.  Eyes:     General: No scleral icterus. Cardiovascular:     Rate and Rhythm: Normal rate and regular rhythm.  Pulmonary:     Effort: Pulmonary effort is normal. No respiratory distress.     Breath sounds: No wheezing or rales.     Comments: Decreased breath sound bilaterally Abdominal:     General: Bowel sounds are normal. There is no distension.     Palpations: Abdomen is soft.  Musculoskeletal:        General: Swelling present. Normal range of motion.     Cervical back: Normal range of motion and neck supple.     Right lower leg: Edema present.     Left lower leg: Edema present.  Skin:    General: Skin is warm and dry.     Findings: No erythema.  Neurological:     Mental Status: She is alert and oriented to person, place, and time. Mental status is at baseline.     Motor: No abnormal muscle tone.  Psychiatric:        Mood and Affect: Mood and affect normal.      LABORATORY DATA:  I have reviewed the data as listed    Latest Ref Rng & Units 03/31/2024    8:33 AM 03/17/2024    8:48 AM 03/03/2024    9:12 AM  CBC  WBC 4.0 - 10.5 K/uL 5.8  6.3  5.7   Hemoglobin 12.0 - 15.0 g/dL 88.2  88.7  89.5   Hematocrit 36.0 - 46.0 % 35.1  33.4  31.5   Platelets 150 - 400 K/uL 296  254  402       Latest Ref Rng & Units 03/31/2024    8:33 AM 03/17/2024    8:48 AM 03/03/2024    9:12 AM  CMP  Glucose 70 - 99 mg/dL 829  864  851   BUN 8 - 23 mg/dL 18  14  11    Creatinine 0.44 - 1.00 mg/dL 9.13  9.17  9.13   Sodium 135 - 145 mmol/L 135  136  135   Potassium 3.5 - 5.1 mmol/L 3.6  4.0  3.4   Chloride 98 - 111 mmol/L 100  103  102   CO2 22 - 32 mmol/L 27  25  24    Calcium  8.9 - 10.3 mg/dL 9.2  8.8  8.6   Total Protein 6.5 - 8.1 g/dL 8.1  8.0  7.5   Total Bilirubin 0.0 - 1.2 mg/dL 0.7  0.5  0.6   Alkaline Phos 38 - 126 U/L 140  136  134   AST 15 - 41 U/L 20  19  21    ALT 0 - 44 U/L 16  12  14       RADIOGRAPHIC STUDIES: I have personally reviewed the radiological  images as listed and agreed with the findings in the report. No results found.

## 2024-03-31 NOTE — Assessment & Plan Note (Signed)
 Atrial flutter: with RVR. S/p TEE & cardioverison on 12/28/23  follow-up with cardiology.  Continue metoprolol  and Eliquis 

## 2024-03-31 NOTE — Assessment & Plan Note (Signed)
 continue OxyContin  10 mg every 12 hours oxycodone  5 mg every 4-6 hours as needed -  .

## 2024-03-31 NOTE — Assessment & Plan Note (Addendum)
 stage IV pancreatic cancer with liver metastasis was discussed with patient.  Other differential diagnosis include GI tract adenocarcinoma, cholangiocarcinoma etc. Baseline CEA is elevated.  CA 19-9 is normal. CT and MRI imaging is did not show any GI primary.I recommend a PET scan for further evaluation- patient rescheduled appt initially and later on PET scan was not done due to admission. I offered to call her daughter to give her updates and patient declines.   Labs are reviewed and discussed with patient. Patient did not tolerate Gemciabine and Abraxane  well.  On dose reduced monotherapy Gemcitaibine 900mg /m2 1 week on 1 week off  Proceed with cycle 3 D15 gemcitabine , she tolerates well CEA is trending down. Repeat CT for evaluation of treatment response.  She has established care with palliative care service.

## 2024-03-31 NOTE — Assessment & Plan Note (Signed)
 Chemotherapy plan as listed above

## 2024-03-31 NOTE — Progress Notes (Signed)
 Nutrition Follow-up:  Patient with pancreatic cancer metastasized to the liver.  Patient receiving gemcitabine  alone.   Met with patient during infusion.  Reports good appetite.  Denies nutrition impact symptoms at this time.  Had hamburger and fries yesterday and peanut butter and jelly sandwich.      Medications: reviewed  Labs: reviewed  Anthropometrics:   Weight 257 lb today (fluid in feet and ankles) 262 lb on 7/17 289 lb on 6/6 (fluid)    NUTRITION DIAGNOSIS: Unintentional weight loss continues but fluid weight loss    INTERVENTION:  Discussed healthy, well balanced diet.  Handout provided.  Encouraged low sodium foods with CHF and fluid in ankles and feet.      MONITORING, EVALUATION, GOAL: weight trends, intake   NEXT VISIT: Thursday, Sept 25 during infusion  Elsi Stelzer B. Dasie SOLON, CSO, LDN Registered Dietitian [phone]

## 2024-04-01 LAB — CEA: CEA: 53.5 ng/mL — ABNORMAL HIGH (ref 0.0–4.7)

## 2024-04-01 LAB — CANCER ANTIGEN 19-9: CA 19-9: <2 U/mL (ref 0–35)

## 2024-04-06 ENCOUNTER — Ambulatory Visit

## 2024-04-14 ENCOUNTER — Encounter: Payer: Self-pay | Admitting: Oncology

## 2024-04-14 ENCOUNTER — Inpatient Hospital Stay (HOSPITAL_BASED_OUTPATIENT_CLINIC_OR_DEPARTMENT_OTHER): Admitting: Oncology

## 2024-04-14 ENCOUNTER — Inpatient Hospital Stay

## 2024-04-14 VITALS — BP 130/80 | HR 70

## 2024-04-14 VITALS — BP 155/84 | HR 87 | Temp 97.3°F | Resp 18 | Wt 252.8 lb

## 2024-04-14 DIAGNOSIS — Z5111 Encounter for antineoplastic chemotherapy: Secondary | ICD-10-CM | POA: Diagnosis not present

## 2024-04-14 DIAGNOSIS — C259 Malignant neoplasm of pancreas, unspecified: Secondary | ICD-10-CM

## 2024-04-14 DIAGNOSIS — G893 Neoplasm related pain (acute) (chronic): Secondary | ICD-10-CM

## 2024-04-14 LAB — CMP (CANCER CENTER ONLY)
ALT: 15 U/L (ref 0–44)
AST: 21 U/L (ref 15–41)
Albumin: 3.5 g/dL (ref 3.5–5.0)
Alkaline Phosphatase: 140 U/L — ABNORMAL HIGH (ref 38–126)
Anion gap: 9 (ref 5–15)
BUN: 12 mg/dL (ref 8–23)
CO2: 25 mmol/L (ref 22–32)
Calcium: 9 mg/dL (ref 8.9–10.3)
Chloride: 101 mmol/L (ref 98–111)
Creatinine: 0.88 mg/dL (ref 0.44–1.00)
GFR, Estimated: >60 mL/min (ref >60)
Glucose, Bld: 180 mg/dL — ABNORMAL HIGH (ref 70–99)
Potassium: 3.6 mmol/L (ref 3.5–5.1)
Sodium: 135 mmol/L (ref 135–145)
Total Bilirubin: 0.7 mg/dL (ref 0.0–1.2)
Total Protein: 7.9 g/dL (ref 6.5–8.1)

## 2024-04-14 LAB — CBC WITH DIFFERENTIAL (CANCER CENTER ONLY)
Abs Immature Granulocytes: 0.01 K/uL (ref 0.00–0.07)
Basophils Absolute: 0 K/uL (ref 0.0–0.1)
Basophils Relative: 1 %
Eosinophils Absolute: 0.2 K/uL (ref 0.0–0.5)
Eosinophils Relative: 4 %
HCT: 36.1 % (ref 36.0–46.0)
Hemoglobin: 12.1 g/dL (ref 12.0–15.0)
Immature Granulocytes: 0 %
Lymphocytes Relative: 26 %
Lymphs Abs: 1.4 K/uL (ref 0.7–4.0)
MCH: 31.3 pg (ref 26.0–34.0)
MCHC: 33.5 g/dL (ref 30.0–36.0)
MCV: 93.3 fL (ref 80.0–100.0)
Monocytes Absolute: 0.8 K/uL (ref 0.1–1.0)
Monocytes Relative: 14 %
Neutro Abs: 2.9 K/uL (ref 1.7–7.7)
Neutrophils Relative %: 55 %
Platelet Count: 204 K/uL (ref 150–400)
RBC: 3.87 MIL/uL (ref 3.87–5.11)
RDW: 17.2 % — ABNORMAL HIGH (ref 11.5–15.5)
WBC Count: 5.4 K/uL (ref 4.0–10.5)
nRBC: 0 % (ref 0.0–0.2)

## 2024-04-14 MED ORDER — PROCHLORPERAZINE MALEATE 10 MG PO TABS
10.0000 mg | ORAL_TABLET | Freq: Once | ORAL | Status: AC
  Administered 2024-04-14: 10 mg via ORAL
  Filled 2024-04-14: qty 1

## 2024-04-14 MED ORDER — SODIUM CHLORIDE 0.9 % IV SOLN
900.0000 mg/m2 | Freq: Once | INTRAVENOUS | Status: AC
  Administered 2024-04-14: 2090 mg via INTRAVENOUS
  Filled 2024-04-14: qty 54.97

## 2024-04-14 MED ORDER — SODIUM CHLORIDE 0.9 % IV SOLN
INTRAVENOUS | Status: DC
  Filled 2024-04-14: qty 250

## 2024-04-14 NOTE — Patient Instructions (Signed)
 [facility] - A DEPT OF [facility]  Discharge Instructions: Thank you for choosing [facility] to provide your oncology and hematology care.  If you have a lab appointment with the Cancer Center, please go directly to the Cancer Center and check in at the registration area.  Wear comfortable clothing and clothing appropriate for easy access to any Portacath or PICC line.   We strive to give you quality time with your provider. You may need to reschedule your appointment if you arrive late (15 or more minutes).  Arriving late affects you and other patients whose appointments are after yours.  Also, if you miss three or more appointments without notifying the office, you may be dismissed from the clinic at the provider's discretion.      For prescription refill requests, have your pharmacy contact our office and allow 72 hours for refills to be completed.    Today you received the following chemotherapy and/or immunotherapy agents Gemzar      To help prevent nausea and vomiting after your treatment, we encourage you to take your nausea medication as directed.  BELOW ARE SYMPTOMS THAT SHOULD BE REPORTED IMMEDIATELY: *FEVER GREATER THAN 100.4 F (38 C) OR HIGHER *CHILLS OR SWEATING *NAUSEA AND VOMITING THAT IS NOT CONTROLLED WITH YOUR NAUSEA MEDICATION *UNUSUAL SHORTNESS OF BREATH *UNUSUAL BRUISING OR BLEEDING *URINARY PROBLEMS (pain or burning when urinating, or frequent urination) *BOWEL PROBLEMS (unusual diarrhea, constipation, pain near the anus) TENDERNESS IN MOUTH AND THROAT WITH OR WITHOUT PRESENCE OF ULCERS (sore throat, sores in mouth, or a toothache) UNUSUAL RASH, SWELLING OR PAIN  UNUSUAL VAGINAL DISCHARGE OR ITCHING   Items with * indicate a potential emergency and should be followed up as soon as possible or go to the Emergency Department if any problems should occur.  Please show the CHEMOTHERAPY ALERT CARD or IMMUNOTHERAPY ALERT  CARD at check-in to the Emergency Department and triage nurse.  Should you have questions after your visit or need to cancel or reschedule your appointment, please contact [facility] - A DEPT OF [facility]  [phone] and follow the prompts.  Office hours are 8:00 a.m. to 4:30 p.m. Monday - Friday. Please note that voicemails left after 4:00 p.m. may not be returned until the following business day.  We are closed weekends and major holidays. You have access to a nurse at all times for urgent questions. Please call the main number to the clinic [phone] and follow the prompts.  For any non-urgent questions, you may also contact your provider using MyChart. We now offer e-Visits for anyone 60 and older to request care online for non-urgent symptoms. For details visit [email or web address].   Also download the MyChart app! Go to the app store, search "MyChart", open the app, select [place], and log in with your MyChart username and password.

## 2024-04-14 NOTE — Progress Notes (Signed)
 [facility] CSW Progress Note  Visual merchandiser met with patient to follow-up on emotional support.    Interventions: Provided brief mental health counseling with regard to continued adjustment to her illness.  She reports no concerns or needs at this time.  Application was sent to [facility].       Follow Up Plan:  CSW will follow-up with patient by phone     Macario CHRISTELLA Au, LCSW Clinical Social Worker [facility]    Patient is participating in a Managed Medicaid Plan:  Yes

## 2024-04-14 NOTE — Assessment & Plan Note (Signed)
 continue OxyContin  10 mg every 12 hours oxycodone  5 mg every 4-6 hours as needed -  .

## 2024-04-14 NOTE — Assessment & Plan Note (Signed)
 stage IV pancreatic cancer with liver metastasis was discussed with patient.  Other differential diagnosis include GI tract adenocarcinoma, cholangiocarcinoma etc. Baseline CEA is elevated.  CA 19-9 is normal. CT and MRI imaging is did not show any GI primary.I recommend a PET scan for further evaluation- patient rescheduled appt initially and later on PET scan was not done due to admission. I offered to call her daughter to give her updates and patient declines.   Labs are reviewed and discussed with patient. Patient did not tolerate Gemciabine and Abraxane  well.  On dose reduced monotherapy Gemcitaibine 900mg /m2 1 week on 1 week off  Proceed with cycle 3 D15 gemcitabine , she tolerates well CEA is trending down. Repeat CT for evaluation of treatment response.  She has established care with palliative care service.

## 2024-04-14 NOTE — Assessment & Plan Note (Signed)
 Chemotherapy plan as listed above

## 2024-04-14 NOTE — Progress Notes (Signed)
 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  ASSESSMENT & PLAN:   Malignant neoplasm of pancreas (HCC) stage IV pancreatic cancer with liver metastasis was discussed with patient.  Other differential diagnosis include GI tract adenocarcinoma, cholangiocarcinoma etc. Baseline CEA is elevated.  CA 19-9 is normal. CT and MRI imaging is did not show any GI primary.I recommend a PET scan for further evaluation- patient rescheduled appt initially and later on PET scan was not done due to admission. I offered to call her daughter to give her updates and patient declines.   Labs are reviewed and discussed with patient. Patient did not tolerate Gemciabine and Abraxane  well.  On dose reduced monotherapy Gemcitaibine 900mg /m2 1 week on 1 week off  Proceed with cycle 3 D15 gemcitabine , she tolerates well CEA is trending down. Repeat CT for evaluation of treatment response.  She has established care with palliative care service.     Encounter for antineoplastic chemotherapy Chemotherapy plan as listed above  Neoplasm related pain continue OxyContin  10 mg every 12 hours oxycodone  5 mg every 4-6 hours as needed -  .     No orders of the defined types were placed in this encounter.  Follow-up 2 weeks All questions were answered. The patient knows to call the clinic with any problems, questions or concerns.  [name], MD, PhD [facility] Hematology Oncology 04/14/2024    HISTORY OF PRESENTING ILLNESS:  Kristina Cruz 63 y.o. female presents to establish care for Stage IV pancreatic cancer I have reviewed her chart and materials related to her cancer extensively and collaborated history with the patient. Summary of oncologic history is as follows: Oncology History  Malignant neoplasm of pancreas (HCC)  08/21/2023 Imaging   CT abdomen pelvis with  contrast showed 1. Masslike thickening involving the proximal and mid tail of pancreas with relative hypoenhancement measures approximately 3.9 x 5.5 cm. In the absence of signs/symptoms of acute pancreatitis underlying neoplastic process cannot be excluded. Further evaluation with nonemergent contrast enhanced MRI of the abdomen is advised. Note: Given patient's body habitus and the motion artifact observed on the current exam an MRI obtained at this time is likely to be severely limited and likely nondiagnostic. MRI should be obtained only once the patient is clinically stable, and is able to remain motionless and breath hold. 2. Patchy ground-glass and airspace densities identified within both lower lobes. These are new compared with the previous exam and are concerning for underlying inflammatory/infectious process. 3. Sigmoid diverticulosis without signs of acute diverticulitis. 4. Marked diastasis recti with ventral herniation of the large and small bowel loops. 5. Periumbilical hernia is containing fat only.   08/24/2023 Imaging   MRI abdomen wo contrast  1. Moderately suboptimal unenhanced exam. 2. Heterogeneous masslike thickening of the pancreatic body/tail again seen. The lesion remains indeterminate on this exam. Differential diagnosis includes pancreatic tumor, autoimmune pancreatitis, mass forming chronic pancreatitis, etc. Please see above for follow-up recommendations. 3. Mild diffuse hepatic steatosis. There are indeterminate areas in the liver, which can also be better evaluated on the contrast-enhanced MRI abdomen. 4. Other observations, as described above.   11/05/2023 Initial Diagnosis   Pancreatic cancer metastasized to liver  Patient was hospitalized from 11/05/2023 - 11/10/2023 She presented to emergency room due to nausea, vomiting and abdominal pain. CT scan showed multiple hypodense hepatic lesions increasing number and size from prior imaging.  Pancreatic  tail mass,  hypodense lesion in the anterior spleen.  11/09/2023 ultrasound-guided biopsy was obtained. Pathology showed moderately to poorly differentiated adenocarcinoma with extensive areas of tumor necrosis. The adenocarcinoma is positive for cytokeratin 7 and CDX2 (focal).     Cytokeratin 20, TTF-1, and GATA3 are negative. The pattern of immunoreactivity is not specific and the differential diagnosis includes metastatic adenocarcinoma from the pancreaticobiliary tract and upper GI. Intrahepatic       cholangiocarcinoma is also a diagnostic possibility. Given the radiographic  findings a pancreaticobiliary primary is favored and correlation with clinical impression is required.    11/17/2023 Cancer Staging   Staging form: Exocrine Pancreas, AJCC 8th Edition - Clinical stage from 11/17/2023: Stage IV (cT3, cNX, pM1) - Signed by Babara Call, MD on 11/17/2023 Stage prefix: Initial diagnosis    Imaging   CT abdomen pelvis w contrast  1. Multiple hypodense hepatic lesions, increased in number and size from prior imaging, highly suspicious for metastatic disease. 2. Hypodense lesion in the pancreatic tail measures 7.2 x 3.9 cm, increased in size from prior. The increase in size favors pancreatic neoplasm, with additional etiologies less likely. 3. New hypodense lesion in the anterior spleen, 13 mm, suspicious for metastatic disease. 4. Laxity of the anterior abdominal wall with midline ventral abdominal wall hernia containing small and to a lesser extent large bowel. No bowel obstruction or inflammation. 5. Colonic diverticulosis without diverticulitis.   Aortic Atherosclerosis (ICD10-I70.0)    11/27/2023 -  Chemotherapy   Patient is on Treatment Plan : PANCREATIC Abraxane  D1,8,15 + Gemcitabine  D1,8,15 q28d      Hospitalization due to Atrial flutter: with RVR. S/p TEE & cardioverison on 12/28/23, severe sepsis, gastroenteritis.  01/30/2024 - 01/31/2024 patient was hospitalized due to CHF  exacerbation, she also received antibiotics for E. coli UTI.  Today patient denies any nausea vomiting diarrhea.  No fever or chills.  She feels well. Tolerated gemcitabine  monotherapy well.    chronic bilateral lower extremity edema, skin breakdown.    MEDICAL HISTORY:  Past Medical History:  Diagnosis Date   Anemia    Asthma    Back pain    Cataract    Diabetes mellitus without complication (HCC)    GERD (gastroesophageal reflux disease)    History of echocardiogram    Hypertension    Morbid obesity with BMI of 60.0-69.9, adult (HCC)    Restless leg syndrome     SURGICAL HISTORY: Past Surgical History:  Procedure Laterality Date   ABDOMINAL HYSTERECTOMY     CARDIOVERSION N/A 12/28/2023   Procedure: CARDIOVERSION;  Surgeon: Darliss Rogue, MD;  Location: [facility] ORS;  Service: Cardiovascular;  Laterality: N/A;   CATARACT EXTRACTION W/PHACO Left 04/13/2020   Procedure: CATARACT EXTRACTION PHACO AND INTRAOCULAR LENS PLACEMENT (IOC);  Surgeon: Ferol Rogue, MD;  Location: [facility] ORS;  Service: Ophthalmology;  Laterality: Left;  US  00:36.0 CDE 5.95 Fluid Pack lot # [id number]   HYSTEROSCOPY WITH D & C N/A 07/14/2017   Procedure: DILATATION AND CURETTAGE /HYSTEROSCOPY;  Surgeon: Arloa Lamar SQUIBB, MD;  Location: [facility] ORS;  Service: Gynecology;  Laterality: N/A;   IR IMAGING GUIDED PORT INSERTION  12/17/2023   POLYPECTOMY  2015   TEE WITHOUT CARDIOVERSION N/A 12/28/2023   Procedure: ECHOCARDIOGRAM, TRANSESOPHAGEAL;  Surgeon: Darliss Rogue, MD;  Location: [facility] ORS;  Service: Cardiovascular;  Laterality: N/A;    SOCIAL HISTORY: Social History   Socioeconomic History   Marital status: Single    Spouse name: Not on file   Number of children: Not on file  Years of education: Not on file   Highest education level: Not on file  Occupational History   Not on file  Tobacco Use   Smoking status: Never   Smokeless tobacco: Never  Vaping Use   Vaping status: Never Used  Substance  and Sexual Activity   Alcohol use: No   Drug use: No   Sexual activity: Never    Birth control/protection: None  Other Topics Concern   Not on file  Social History Narrative   Not on file   Social Drivers of Health   Financial Resource Strain: Low Risk  (09/26/2022)   Received [facility]   Overall Financial Resource Strain (CARDIA)    Difficulty of Paying Living Expenses: Not hard at all  Food Insecurity: Patient Declined (01/30/2024)   Hunger Vital Sign    Worried About Running Out of Food in the Last Year: Patient declined    Ran Out of Food in the Last Year: Patient declined  Transportation Needs: Patient Declined (01/30/2024)   PRAPARE - Administrator, Civil Service (Medical): Patient declined    Lack of Transportation (Non-Medical): Patient declined  Physical Activity: Not on file  Stress: Not on file  Social Connections: Socially Isolated (12/25/2023)   Social Connection and Isolation Panel    Frequency of Communication with Friends and Family: More than three times a week    Frequency of Social Gatherings with Friends and Family: More than three times a week    Attends Religious Services: Never    Database administrator or Organizations: No    Attends Banker Meetings: Never    Marital Status: Never married  Intimate Partner Violence: Patient Declined (01/30/2024)   Humiliation, Afraid, Rape, and Kick questionnaire    Fear of Current or Ex-Partner: Patient declined    Emotionally Abused: Patient declined    Physically Abused: Patient declined    Sexually Abused: Patient declined    FAMILY HISTORY: Family History  Problem Relation Age of Onset   Breast cancer Mother 19   Diabetes Mother    Hypertension Mother    Ovarian cancer Paternal Aunt        ?   Diabetes Father    Hypertension Father     ALLERGIES:  is allergic to peanut-containing drug products.  MEDICATIONS:  Current Outpatient Medications  Medication  Sig Dispense Refill   acetaminophen  (TYLENOL  8 HOUR ARTHRITIS PAIN) 650 MG CR tablet Take 3 tablets (1,950 mg total) by mouth every 8 (eight) hours as needed for pain. Do not exceed 4000 mg total in a day.  Home med.     apixaban  (ELIQUIS ) 5 MG TABS tablet Take 1 tablet (5 mg total) by mouth 2 (two) times daily. 60 tablet 0   azelastine  (ASTELIN ) 0.1 % nasal spray Place 1 spray into both nostrils 2 (two) times daily.     baclofen  (LIORESAL ) 10 MG tablet Take 10 mg by mouth 3 (three) times daily.     diphenoxylate -atropine  (LOMOTIL ) 2.5-0.025 MG tablet Take 2 tablets by mouth every 6 (six) hours as needed (for diarrhea unrelieved by imodium). 90 tablet 0   EPINEPHrine  0.3 mg/0.3 mL IJ SOAJ injection Inject 0.3 mg into the muscle as needed for anaphylaxis. 1 each 0   fluticasone  (FLONASE ) 50 MCG/ACT nasal spray Place 1 spray into both nostrils daily. 9.9 mL 0   fluticasone  furoate-vilanterol (BREO ELLIPTA ) 100-25 MCG/ACT AEPB Inhale 1 puff into the lungs daily. 28 each 1  furosemide  (LASIX ) 40 MG tablet Take 40 mg by mouth daily.     gabapentin  (NEURONTIN ) 100 MG capsule Take 1 capsule (100 mg total) by mouth 3 (three) times daily. 60 capsule 0   ipratropium-albuterol  (DUONEB) 0.5-2.5 (3) MG/3ML SOLN Take 3 mLs by nebulization every 4 (four) hours as needed (wheezing / shortness of breath). 360 mL 1   levocetirizine (XYZAL ) 5 MG tablet Take 5 mg by mouth daily.     lidocaine -prilocaine  (EMLA ) cream Apply 1 Application topically as needed. Apply to port and cover with saran wrap 1-2 hours prior to port access 30 g 1   magic mouthwash (lidocaine , diphenhydrAMINE , alum & mag hydroxide) suspension Swish and swallow 5 mLs 4 (four) times daily as needed for mouth pain. 360 mL 1   metFORMIN  (GLUCOPHAGE ) 1000 MG tablet Take 1,000 mg by mouth 2 (two) times daily.     metoprolol  tartrate (LOPRESSOR ) 25 MG tablet Take 1 tablet (25 mg total) by mouth 2 (two) times daily. 60 tablet 0   morphine  (MS CONTIN ) 15 MG  12 hr tablet Take 1 tablet (15 mg total) by mouth every 12 (twelve) hours. 30 tablet 0   naloxone  (NARCAN ) nasal spray 4 mg/0.1 mL SPRAY 1 SPRAY INTO ONE NOSTRIL AS DIRECTED FOR OPIOID OVERDOSE (TURN PERSON ON SIDE AFTER DOSE. IF NO RESPONSE IN 2-3 MINUTES OR PERSON RESPONDS BUT RELAPSES, REPEAT USING A NEW SPRAY DEVICE AND SPRAY INTO THE OTHER NOSTRIL. CALL 911 AFTER USE.) * EMERGENCY USE ONLY * 1 each 0   ondansetron  (ZOFRAN -ODT) 4 MG disintegrating tablet Take 1 tablet (4 mg total) by mouth every 8 (eight) hours as needed for nausea or vomiting. 45 tablet 0   oxyCODONE  (OXY IR/ROXICODONE ) 5 MG immediate release tablet Take 1 tablet (5 mg total) by mouth every 4 (four) hours as needed for moderate pain (pain score 4-6) or severe pain (pain score 7-10). 45 tablet 0   prochlorperazine  (COMPAZINE ) 10 MG tablet Take 1 tablet (10 mg total) by mouth every 6 (six) hours as needed for nausea or vomiting. 30 tablet 1   rOPINIRole  (REQUIP ) 2 MG tablet Take 2 mg by mouth in the morning and at bedtime.      spironolactone  (ALDACTONE ) 50 MG tablet Take 1 tablet (50 mg total) by mouth daily. 30 tablet 1   traZODone  (DESYREL ) 50 MG tablet Take 0.5-1 tablets (25-50 mg total) by mouth at bedtime as needed for sleep. 30 tablet 2   VENTOLIN  HFA 108 (90 Base) MCG/ACT inhaler Inhale 2 puffs into the lungs every 4 (four) hours as needed for wheezing or shortness of breath. 18 g 1   No current facility-administered medications for this visit.   Facility-Administered Medications Ordered in Other Visits  Medication Dose Route Frequency Provider Last Rate Last Admin   0.9 %  sodium chloride  infusion   Intravenous Continuous Babara Call, MD   Stopped at 04/14/24 1103    Review of Systems  Constitutional:  Positive for fatigue. Negative for appetite change, chills and fever.  HENT:   Negative for hearing loss and voice change.   Eyes:  Negative for eye problems.  Respiratory:  Negative for chest tightness, cough and  shortness of breath.   Cardiovascular:  Positive for leg swelling. Negative for chest pain.  Gastrointestinal:  Positive for nausea. Negative for abdominal distention, abdominal pain and blood in stool.  Endocrine: Negative for hot flashes.  Genitourinary:  Negative for difficulty urinating and frequency.   Musculoskeletal:  Negative for arthralgias.  Skin:  Negative for itching and rash.  Neurological:  Negative for extremity weakness.  Hematological:  Negative for adenopathy.  Psychiatric/Behavioral:  Negative for confusion.      PHYSICAL EXAMINATION: ECOG PERFORMANCE STATUS: 1 - Symptomatic but completely ambulatory  Vitals:   04/14/24 0910 04/14/24 0918  BP: (!) 167/88 (!) 155/84  Pulse: 87   Resp: 18   Temp: (!) 97.3 F (36.3 C)   SpO2: 98%    Filed Weights   04/14/24 0910  Weight: 252 lb 12.8 oz (114.7 kg)     Physical Exam Constitutional:      General: She is not in acute distress.    Appearance: She is obese. She is not diaphoretic.  HENT:     Head: Normocephalic and atraumatic.  Eyes:     General: No scleral icterus. Cardiovascular:     Rate and Rhythm: Normal rate and regular rhythm.  Pulmonary:     Effort: Pulmonary effort is normal. No respiratory distress.     Breath sounds: No wheezing or rales.     Comments: Decreased breath sound bilaterally Abdominal:     General: Bowel sounds are normal. There is no distension.     Palpations: Abdomen is soft.  Musculoskeletal:        General: Swelling present. Normal range of motion.     Cervical back: Normal range of motion and neck supple.     Right lower leg: Edema present.     Left lower leg: Edema present.  Skin:    General: Skin is warm and dry.     Findings: No erythema.  Neurological:     Mental Status: She is alert and oriented to person, place, and time. Mental status is at baseline.     Motor: No abnormal muscle tone.  Psychiatric:        Mood and Affect: Mood and affect normal.       LABORATORY DATA:  I have reviewed the data as listed    Latest Ref Rng & Units 04/14/2024    9:05 AM 03/31/2024    8:33 AM 03/17/2024    8:48 AM  CBC  WBC 4.0 - 10.5 K/uL 5.4  5.8  6.3   Hemoglobin 12.0 - 15.0 g/dL 87.8  88.2  88.7   Hematocrit 36.0 - 46.0 % 36.1  35.1  33.4   Platelets 150 - 400 K/uL 204  296  254       Latest Ref Rng & Units 04/14/2024    9:05 AM 03/31/2024    8:33 AM 03/17/2024    8:48 AM  CMP  Glucose 70 - 99 mg/dL 819  829  864   BUN 8 - 23 mg/dL 12  18  14    Creatinine 0.44 - 1.00 mg/dL 9.11  9.13  9.17   Sodium 135 - 145 mmol/L 135  135  136   Potassium 3.5 - 5.1 mmol/L 3.6  3.6  4.0   Chloride 98 - 111 mmol/L 101  100  103   CO2 22 - 32 mmol/L 25  27  25    Calcium  8.9 - 10.3 mg/dL 9.0  9.2  8.8   Total Protein 6.5 - 8.1 g/dL 7.9  8.1  8.0   Total Bilirubin 0.0 - 1.2 mg/dL 0.7  0.7  0.5   Alkaline Phos 38 - 126 U/L 140  140  136   AST 15 - 41 U/L 21  20  19    ALT 0 - 44 U/L 15  16  12      RADIOGRAPHIC STUDIES: I have personally reviewed the radiological images as listed and agreed with the findings in the report. No results found.

## 2024-04-15 ENCOUNTER — Ambulatory Visit: Admitting: Physician Assistant

## 2024-04-19 ENCOUNTER — Ambulatory Visit: Admitting: Physician Assistant

## 2024-04-20 ENCOUNTER — Inpatient Hospital Stay: Attending: Oncology | Admitting: Hospice and Palliative Medicine

## 2024-04-20 ENCOUNTER — Ambulatory Visit: Admission: RE | Admit: 2024-04-20 | Source: Ambulatory Visit

## 2024-04-20 DIAGNOSIS — C259 Malignant neoplasm of pancreas, unspecified: Secondary | ICD-10-CM | POA: Diagnosis not present

## 2024-04-20 DIAGNOSIS — C252 Malignant neoplasm of tail of pancreas: Secondary | ICD-10-CM | POA: Insufficient documentation

## 2024-04-20 DIAGNOSIS — R600 Localized edema: Secondary | ICD-10-CM | POA: Insufficient documentation

## 2024-04-20 DIAGNOSIS — G893 Neoplasm related pain (acute) (chronic): Secondary | ICD-10-CM | POA: Diagnosis not present

## 2024-04-20 DIAGNOSIS — Z8041 Family history of malignant neoplasm of ovary: Secondary | ICD-10-CM | POA: Insufficient documentation

## 2024-04-20 DIAGNOSIS — G620 Drug-induced polyneuropathy: Secondary | ICD-10-CM | POA: Insufficient documentation

## 2024-04-20 DIAGNOSIS — Z5111 Encounter for antineoplastic chemotherapy: Secondary | ICD-10-CM | POA: Insufficient documentation

## 2024-04-20 DIAGNOSIS — Z7901 Long term (current) use of anticoagulants: Secondary | ICD-10-CM | POA: Insufficient documentation

## 2024-04-20 DIAGNOSIS — E876 Hypokalemia: Secondary | ICD-10-CM | POA: Insufficient documentation

## 2024-04-20 DIAGNOSIS — C787 Secondary malignant neoplasm of liver and intrahepatic bile duct: Secondary | ICD-10-CM | POA: Insufficient documentation

## 2024-04-20 DIAGNOSIS — T451X5A Adverse effect of antineoplastic and immunosuppressive drugs, initial encounter: Secondary | ICD-10-CM | POA: Insufficient documentation

## 2024-04-20 DIAGNOSIS — I4891 Unspecified atrial fibrillation: Secondary | ICD-10-CM | POA: Insufficient documentation

## 2024-04-20 DIAGNOSIS — Z515 Encounter for palliative care: Secondary | ICD-10-CM

## 2024-04-20 DIAGNOSIS — I4892 Unspecified atrial flutter: Secondary | ICD-10-CM | POA: Insufficient documentation

## 2024-04-20 DIAGNOSIS — R079 Chest pain, unspecified: Secondary | ICD-10-CM | POA: Insufficient documentation

## 2024-04-20 DIAGNOSIS — Z803 Family history of malignant neoplasm of breast: Secondary | ICD-10-CM | POA: Insufficient documentation

## 2024-04-20 DIAGNOSIS — R970 Elevated carcinoembryonic antigen [CEA]: Secondary | ICD-10-CM | POA: Insufficient documentation

## 2024-04-20 MED ORDER — GABAPENTIN 300 MG PO CAPS: 300.0000 mg | ORAL_CAPSULE | Freq: Two times a day (BID) | ORAL | 0 refills | Status: DC

## 2024-04-20 NOTE — Progress Notes (Signed)
 Virtual Visit via Telephone Note  I connected with Kristina Cruz on 04/20/24 at  2:20 PM EDT by telephone and verified that I am speaking with the correct person using two identifiers.  Location: Patient: Home Provider: Clinic   I discussed the limitations, risks, security and privacy concerns of performing an evaluation and management service by telephone and the availability of in person appointments. I also discussed with the patient that there may be a patient responsible charge related to this service. The patient expressed understanding and agreed to proceed.   History of Present Illness: Kristina Cruz is a 63 y.o. 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 Palliative care consulted to address goals and manage ongoing symptoms.      Observations/Objective: Telephone visit today for follow-up.  Patient reports that overall she feels she is doing reasonably okay.  She does endorse poorly controlled peripheral neuropathy.  She feels like her gabapentin  helps with that but that symptoms remain poorly controlled.  She denies any adverse effects from gabapentin  such as sedation or lethargy.  Discussed that we could increase her dose to 300 mg twice daily to see if this helps.  Patient also continues to take oxycodone  as needed.  Patient denies other symptomatic complaints or concerns.  She remains in agreement with current scope of treatment.  Assessment and Plan: Stage IV pancreatic cancer -on gemcitabine   Neoplasm related pain -Continue oxycodone .  Daily bowel regimen to prevent opioid-induced constipation.  PDMP reviewed.  Chemo-induced peripheral neuropathy -increase gabapentin  300 mg twice daily.  If ineffective, could consider duloxetine.  Advance directives -patient would like to meet with social work to discuss body donation program  Follow Up  Instructions: Follow-up telephone visit 1 month   I discussed the assessment and treatment plan with the patient. The patient was provided an opportunity to ask questions and all were answered. The patient agreed with the plan and demonstrated an understanding of the instructions.   The patient was advised to call back or seek an in-person evaluation if the symptoms worsen or if the condition fails to improve as anticipated.  I provided 10 minutes of non-face-to-face time during this encounter.   FONDA JONELLE MOWER, NP

## 2024-04-28 ENCOUNTER — Inpatient Hospital Stay

## 2024-04-28 ENCOUNTER — Inpatient Hospital Stay (HOSPITAL_BASED_OUTPATIENT_CLINIC_OR_DEPARTMENT_OTHER): Admitting: Oncology

## 2024-04-28 ENCOUNTER — Encounter: Payer: Self-pay | Admitting: Oncology

## 2024-04-28 VITALS — BP 140/74 | HR 65 | Temp 97.8°F | Resp 19 | Wt 246.4 lb

## 2024-04-28 VITALS — BP 141/73

## 2024-04-28 DIAGNOSIS — C259 Malignant neoplasm of pancreas, unspecified: Secondary | ICD-10-CM

## 2024-04-28 DIAGNOSIS — Z5111 Encounter for antineoplastic chemotherapy: Secondary | ICD-10-CM

## 2024-04-28 DIAGNOSIS — E876 Hypokalemia: Secondary | ICD-10-CM | POA: Diagnosis not present

## 2024-04-28 DIAGNOSIS — C252 Malignant neoplasm of tail of pancreas: Secondary | ICD-10-CM | POA: Diagnosis not present

## 2024-04-28 DIAGNOSIS — Z8041 Family history of malignant neoplasm of ovary: Secondary | ICD-10-CM | POA: Diagnosis not present

## 2024-04-28 DIAGNOSIS — R079 Chest pain, unspecified: Secondary | ICD-10-CM | POA: Diagnosis not present

## 2024-04-28 DIAGNOSIS — C787 Secondary malignant neoplasm of liver and intrahepatic bile duct: Secondary | ICD-10-CM | POA: Diagnosis not present

## 2024-04-28 DIAGNOSIS — I4891 Unspecified atrial fibrillation: Secondary | ICD-10-CM

## 2024-04-28 DIAGNOSIS — G620 Drug-induced polyneuropathy: Secondary | ICD-10-CM | POA: Diagnosis not present

## 2024-04-28 DIAGNOSIS — T451X5A Adverse effect of antineoplastic and immunosuppressive drugs, initial encounter: Secondary | ICD-10-CM | POA: Diagnosis not present

## 2024-04-28 DIAGNOSIS — R600 Localized edema: Secondary | ICD-10-CM

## 2024-04-28 DIAGNOSIS — Z803 Family history of malignant neoplasm of breast: Secondary | ICD-10-CM | POA: Diagnosis not present

## 2024-04-28 DIAGNOSIS — I4892 Unspecified atrial flutter: Secondary | ICD-10-CM | POA: Diagnosis not present

## 2024-04-28 DIAGNOSIS — R970 Elevated carcinoembryonic antigen [CEA]: Secondary | ICD-10-CM | POA: Diagnosis not present

## 2024-04-28 DIAGNOSIS — G893 Neoplasm related pain (acute) (chronic): Secondary | ICD-10-CM | POA: Diagnosis not present

## 2024-04-28 DIAGNOSIS — Z7901 Long term (current) use of anticoagulants: Secondary | ICD-10-CM | POA: Diagnosis not present

## 2024-04-28 LAB — CBC WITH DIFFERENTIAL (CANCER CENTER ONLY)
Abs Immature Granulocytes: 0.02 K/uL (ref 0.00–0.07)
Basophils Absolute: 0 K/uL (ref 0.0–0.1)
Basophils Relative: 1 %
Eosinophils Absolute: 0.3 K/uL (ref 0.0–0.5)
Eosinophils Relative: 4 %
HCT: 35.3 % — ABNORMAL LOW (ref 36.0–46.0)
Hemoglobin: 11.8 g/dL — ABNORMAL LOW (ref 12.0–15.0)
Immature Granulocytes: 0 %
Lymphocytes Relative: 29 %
Lymphs Abs: 1.7 K/uL (ref 0.7–4.0)
MCH: 31.2 pg (ref 26.0–34.0)
MCHC: 33.4 g/dL (ref 30.0–36.0)
MCV: 93.4 fL (ref 80.0–100.0)
Monocytes Absolute: 0.7 K/uL (ref 0.1–1.0)
Monocytes Relative: 12 %
Neutro Abs: 3.2 K/uL (ref 1.7–7.7)
Neutrophils Relative %: 54 %
Platelet Count: 251 K/uL (ref 150–400)
RBC: 3.78 MIL/uL — ABNORMAL LOW (ref 3.87–5.11)
RDW: 17.2 % — ABNORMAL HIGH (ref 11.5–15.5)
WBC Count: 5.9 K/uL (ref 4.0–10.5)
nRBC: 0 % (ref 0.0–0.2)

## 2024-04-28 LAB — CMP (CANCER CENTER ONLY)
ALT: 17 U/L (ref 0–44)
AST: 20 U/L (ref 15–41)
Albumin: 3.6 g/dL (ref 3.5–5.0)
Alkaline Phosphatase: 153 U/L — ABNORMAL HIGH (ref 38–126)
Anion gap: 10 (ref 5–15)
BUN: 20 mg/dL (ref 8–23)
CO2: 27 mmol/L (ref 22–32)
Calcium: 9.2 mg/dL (ref 8.9–10.3)
Chloride: 98 mmol/L (ref 98–111)
Creatinine: 0.99 mg/dL (ref 0.44–1.00)
GFR, Estimated: >60 mL/min (ref >60)
Glucose, Bld: 189 mg/dL — ABNORMAL HIGH (ref 70–99)
Potassium: 3.4 mmol/L — ABNORMAL LOW (ref 3.5–5.1)
Sodium: 135 mmol/L (ref 135–145)
Total Bilirubin: 0.8 mg/dL (ref 0.0–1.2)
Total Protein: 7.9 g/dL (ref 6.5–8.1)

## 2024-04-28 MED ORDER — SODIUM CHLORIDE 0.9 % IV SOLN
900.0000 mg/m2 | Freq: Once | INTRAVENOUS | Status: AC
  Administered 2024-04-28: 2090 mg via INTRAVENOUS
  Filled 2024-04-28: qty 54.97

## 2024-04-28 MED ORDER — POTASSIUM CHLORIDE CRYS ER 20 MEQ PO TBCR
20.0000 meq | EXTENDED_RELEASE_TABLET | Freq: Every day | ORAL | 0 refills | Status: DC
Start: 2024-04-28 — End: 2024-05-19

## 2024-04-28 MED ORDER — PROCHLORPERAZINE MALEATE 10 MG PO TABS
10.0000 mg | ORAL_TABLET | Freq: Once | ORAL | Status: AC
  Administered 2024-04-28: 10 mg via ORAL
  Filled 2024-04-28: qty 1

## 2024-04-28 MED ORDER — SODIUM CHLORIDE 0.9 % IV SOLN
INTRAVENOUS | Status: DC
  Filled 2024-04-28: qty 250

## 2024-04-28 NOTE — Patient Instructions (Signed)
 [facility] - A DEPT OF [facility]  Discharge Instructions: Thank you for choosing [facility] to provide your oncology and hematology care.  If you have a lab appointment with the Cancer Center, please go directly to the Cancer Center and check in at the registration area.  Wear comfortable clothing and clothing appropriate for easy access to any Portacath or PICC line.   We strive to give you quality time with your provider. You may need to reschedule your appointment if you arrive late (15 or more minutes).  Arriving late affects you and other patients whose appointments are after yours.  Also, if you miss three or more appointments without notifying the office, you may be dismissed from the clinic at the provider's discretion.      For prescription refill requests, have your pharmacy contact our office and allow 72 hours for refills to be completed.    Today you received the following chemotherapy and/or immunotherapy agents GEMZAR      To help prevent nausea and vomiting after your treatment, we encourage you to take your nausea medication as directed.  BELOW ARE SYMPTOMS THAT SHOULD BE REPORTED IMMEDIATELY: *FEVER GREATER THAN 100.4 F (38 C) OR HIGHER *CHILLS OR SWEATING *NAUSEA AND VOMITING THAT IS NOT CONTROLLED WITH YOUR NAUSEA MEDICATION *UNUSUAL SHORTNESS OF BREATH *UNUSUAL BRUISING OR BLEEDING *URINARY PROBLEMS (pain or burning when urinating, or frequent urination) *BOWEL PROBLEMS (unusual diarrhea, constipation, pain near the anus) TENDERNESS IN MOUTH AND THROAT WITH OR WITHOUT PRESENCE OF ULCERS (sore throat, sores in mouth, or a toothache) UNUSUAL RASH, SWELLING OR PAIN  UNUSUAL VAGINAL DISCHARGE OR ITCHING   Items with * indicate a potential emergency and should be followed up as soon as possible or go to the Emergency Department if any problems should occur.  Please show the CHEMOTHERAPY ALERT CARD or IMMUNOTHERAPY ALERT  CARD at check-in to the Emergency Department and triage nurse.  Should you have questions after your visit or need to cancel or reschedule your appointment, please contact [facility] - A DEPT OF [facility]  [phone] and follow the prompts.  Office hours are 8:00 a.m. to 4:30 p.m. Monday - Friday. Please note that voicemails left after 4:00 p.m. may not be returned until the following business day.  We are closed weekends and major holidays. You have access to a nurse at all times for urgent questions. Please call the main number to the clinic [phone] and follow the prompts.  For any non-urgent questions, you may also contact your provider using MyChart. We now offer e-Visits for anyone 58 and older to request care online for non-urgent symptoms. For details visit [email or web address].   Also download the MyChart app! Go to the app store, search "MyChart", open the app, select [place], and log in with your MyChart username and password.  Gemcitabine Injection What is this medication? GEMCITABINE (jem SYE ta been) treats some types of cancer. It works by slowing down the growth of cancer cells. This medicine may be used for other purposes; ask your health care provider or pharmacist if you have questions. COMMON BRAND NAME(S): Gemzar, Infugem What should I tell my care team before I take this medication? They need to know if you have any of these conditions: Blood disorders Infection Kidney disease Liver disease Lung or breathing disease, such as asthma or COPD Recent or ongoing radiation therapy An unusual or allergic  reaction to gemcitabine, other medications, foods, dyes, or preservatives If you or your partner are pregnant or trying to get pregnant Breast-feeding How should I use this medication? This medication is injected into a vein. It is given by your care team in a hospital or clinic setting. Talk to your care team about the use of  this medication in children. Special care may be needed. Overdosage: If you think you have taken too much of this medicine contact a poison control center or emergency room at once. NOTE: This medicine is only for you. Do not share this medicine with others. What if I miss a dose? Keep appointments for follow-up doses. It is important not to miss your dose. Call your care team if you are unable to keep an appointment. What may interact with this medication? Interactions have not been studied. This list may not describe all possible interactions. Give your health care provider a list of all the medicines, herbs, non-prescription drugs, or dietary supplements you use. Also tell them if you smoke, drink alcohol, or use illegal drugs. Some items may interact with your medicine. What should I watch for while using this medication? Your condition will be monitored carefully while you are receiving this medication. This medication may make you feel generally unwell. This is not uncommon, as chemotherapy can affect healthy cells as well as cancer cells. Report any side effects. Continue your course of treatment even though you feel ill unless your care team tells you to stop. In some cases, you may be given additional medications to help with side effects. Follow all directions for their use. This medication may increase your risk of getting an infection. Call your care team for advice if you get a fever, chills, sore throat, or other symptoms of a cold or flu. Do not treat yourself. Try to avoid being around people who are sick. This medication may increase your risk to bruise or bleed. Call your care team if you notice any unusual bleeding. Be careful brushing or flossing your teeth or using a toothpick because you may get an infection or bleed more easily. If you have any dental work done, tell your dentist you are receiving this medication. Avoid taking medications that contain aspirin, acetaminophen,  ibuprofen, naproxen, or ketoprofen unless instructed by your care team. These medications may hide a fever. Talk to your care team if you or your partner wish to become pregnant or think you might be pregnant. This medication can cause serious birth defects if taken during pregnancy and for 6 months after the last dose. A negative pregnancy test is required before starting this medication. A reliable form of contraception is recommended while taking this medication and for 6 months after the last dose. Talk to your care team about effective forms of contraception. Do not father a child while taking this medication and for 3 months after the last dose. Use a condom while having sex during this time period. Do not breastfeed while taking this medication and for at least 1 week after the last dose. This medication may cause infertility. Talk to your care team if you are concerned about your fertility. What side effects may I notice from receiving this medication? Side effects that you should report to your care team as soon as possible: Allergic reactions--skin rash, itching, hives, swelling of the face, lips, tongue, or throat Capillary leak syndrome--stomach or muscle pain, unusual weakness or fatigue, feeling faint or lightheaded, decrease in the amount of urine, swelling of  the ankles, hands, or feet, trouble breathing Infection--fever, chills, cough, sore throat, wounds that don't heal, pain or trouble when passing urine, general feeling of discomfort or being unwell Liver injury--right upper belly pain, loss of appetite, nausea, light-colored stool, dark yellow or brown urine, yellowing skin or eyes, unusual weakness or fatigue Low red blood cell level--unusual weakness or fatigue, dizziness, headache, trouble breathing Lung injury--shortness of breath or trouble breathing, cough, spitting up blood, chest pain, fever Stomach pain, bloody diarrhea, pale skin, unusual weakness or fatigue, decrease in  the amount of urine, which may be signs of hemolytic uremic syndrome Sudden and severe headache, confusion, change in vision, seizures, which may be signs of posterior reversible encephalopathy syndrome (PRES) Unusual bruising or bleeding Side effects that usually do not require medical attention (report to your care team if they continue or are bothersome): Diarrhea Drowsiness Hair loss Nausea Pain, redness, or swelling with sores inside the mouth or throat Vomiting This list may not describe all possible side effects. Call your doctor for medical advice about side effects. You may report side effects to FDA at 1-800-FDA-1088. Where should I keep my medication? This medication is given in a hospital or clinic. It will not be stored at home. NOTE: This sheet is a summary. It may not cover all possible information. If you have questions about this medicine, talk to your doctor, pharmacist, or health care provider.  2024 Elsevier/Gold Standard (2021-12-10 00:00:00)

## 2024-04-28 NOTE — Assessment & Plan Note (Signed)
 Recommend potassium 20meq daily x 3

## 2024-04-28 NOTE — Assessment & Plan Note (Signed)
 stage IV pancreatic cancer with liver metastasis was discussed with patient.  Other differential diagnosis include GI tract adenocarcinoma, cholangiocarcinoma etc. Baseline CEA is elevated.  CA 19-9 is normal. CT and MRI imaging is did not show any GI primary.I recommend a PET scan for further evaluation- patient rescheduled appt initially and later on PET scan was not done due to admission. I offered to call her daughter to give her updates and patient declines.   Labs are reviewed and discussed with patient. Patient did not tolerate Gemciabine and Abraxane  well.  On dose reduced monotherapy Gemcitaibine 900mg /m2 1 week on 1 week off   Proceed with cycle 5 D1 gemcitabine , she tolerates well CEA is trending down. Repeat CT for evaluation of treatment response - no showed. Patient prefers to see her PCP first and get muscle relaxant prescription first before CT. Baclofen  does not help She has established care with palliative care service.

## 2024-04-28 NOTE — Assessment & Plan Note (Signed)
 Recommend gabapentin  100mg  TID PRN.

## 2024-04-28 NOTE — Assessment & Plan Note (Signed)
 Likely secondary to chronically insufficiency/CHF.   I have referred to wound care.  Referred to vascular surgery.

## 2024-04-28 NOTE — Progress Notes (Signed)
 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  ASSESSMENT & PLAN:   Malignant neoplasm of pancreas (HCC) stage IV pancreatic cancer with liver metastasis was discussed with patient.  Other differential diagnosis include GI tract adenocarcinoma, cholangiocarcinoma etc. Baseline CEA is elevated.  CA 19-9 is normal. CT and MRI imaging is did not show any GI primary.I recommend a PET scan for further evaluation- patient rescheduled appt initially and later on PET scan was not done due to admission. I offered to call her daughter to give her updates and patient declines.   Labs are reviewed and discussed with patient. Patient did not tolerate Gemciabine and Abraxane  well.  On dose reduced monotherapy Gemcitaibine 900mg /m2 1 week on 1 week off   Proceed with cycle 5 D1 gemcitabine , she tolerates well CEA is trending down. Repeat CT for evaluation of treatment response - no showed. Patient prefers to see her PCP first and get muscle relaxant prescription first before CT. Baclofen  does not help She has established care with palliative care service.     Atrial fibrillation with RVR (HCC) Atrial flutter: with RVR. S/p TEE & cardioverison on 12/28/23  follow-up with cardiology.  Continue metoprolol  and Eliquis   Chemotherapy-induced neuropathy (HCC) Recommend gabapentin  100mg  TID PRN.    Encounter for antineoplastic chemotherapy Chemotherapy plan as listed above  Leg edema Likely secondary to chronically insufficiency/CHF.   I have referred to wound care.  Referred to vascular surgery.  Hypokalemia Recommend potassium 20meq daily x 3    No orders of the defined types were placed in this encounter.  Follow-up 2 weeks All questions were answered. The patient knows to call the clinic with any problems, questions or concerns.  [name], MD, PhD [facility] Hematology Oncology 04/28/2024    HISTORY OF PRESENTING ILLNESS:  Kristina Cruz 63 y.o. female presents to establish care for Stage IV pancreatic cancer I have reviewed her chart and materials related to her cancer extensively and collaborated history with the patient. Summary of oncologic history is as follows: Oncology History  Malignant neoplasm of pancreas (HCC)  08/21/2023 Imaging   CT abdomen pelvis with contrast showed 1. Masslike thickening involving the proximal and mid tail of pancreas with relative hypoenhancement measures approximately 3.9 x 5.5 cm. In the absence of signs/symptoms of acute pancreatitis underlying neoplastic process cannot be excluded. Further evaluation with nonemergent contrast enhanced MRI of the abdomen is advised. Note: Given patient's body habitus and the motion artifact observed on the current exam an MRI obtained at this time is likely to be severely limited and likely nondiagnostic. MRI should be obtained only once the patient is clinically stable, and is able to remain motionless and breath hold. 2. Patchy ground-glass and airspace densities identified within both lower lobes. These are new compared with the previous exam and are concerning for underlying inflammatory/infectious process. 3. Sigmoid diverticulosis without signs of acute diverticulitis. 4. Marked diastasis recti with ventral herniation of the large and small bowel loops. 5. Periumbilical hernia is containing fat only.   08/24/2023 Imaging   MRI abdomen wo contrast  1. Moderately suboptimal unenhanced exam. 2. Heterogeneous masslike thickening of the pancreatic body/tail again seen. The lesion remains indeterminate on this exam. Differential diagnosis includes pancreatic tumor, autoimmune pancreatitis, mass forming chronic pancreatitis, etc. Please see above for follow-up recommendations. 3. Mild diffuse hepatic steatosis. There are indeterminate areas in the liver,  which can also  be better evaluated on the contrast-enhanced MRI abdomen. 4. Other observations, as described above.   11/05/2023 Initial Diagnosis   Pancreatic cancer metastasized to liver  Patient was hospitalized from 11/05/2023 - 11/10/2023 She presented to emergency room due to nausea, vomiting and abdominal pain. CT scan showed multiple hypodense hepatic lesions increasing number and size from prior imaging.  Pancreatic tail mass, hypodense lesion in the anterior spleen.  11/09/2023 ultrasound-guided biopsy was obtained. Pathology showed moderately to poorly differentiated adenocarcinoma with extensive areas of tumor necrosis. The adenocarcinoma is positive for cytokeratin 7 and CDX2 (focal).     Cytokeratin 20, TTF-1, and GATA3 are negative. The pattern of immunoreactivity is not specific and the differential diagnosis includes metastatic adenocarcinoma from the pancreaticobiliary tract and upper GI. Intrahepatic       cholangiocarcinoma is also a diagnostic possibility. Given the radiographic  findings a pancreaticobiliary primary is favored and correlation with clinical impression is required.    11/17/2023 Cancer Staging   Staging form: Exocrine Pancreas, AJCC 8th Edition - Clinical stage from 11/17/2023: Stage IV (cT3, cNX, pM1) - Signed by Babara Call, MD on 11/17/2023 Stage prefix: Initial diagnosis    Imaging   CT abdomen pelvis w contrast  1. Multiple hypodense hepatic lesions, increased in number and size from prior imaging, highly suspicious for metastatic disease. 2. Hypodense lesion in the pancreatic tail measures 7.2 x 3.9 cm, increased in size from prior. The increase in size favors pancreatic neoplasm, with additional etiologies less likely. 3. New hypodense lesion in the anterior spleen, 13 mm, suspicious for metastatic disease. 4. Laxity of the anterior abdominal wall with midline ventral abdominal wall hernia containing small and to a lesser extent large bowel. No bowel  obstruction or inflammation. 5. Colonic diverticulosis without diverticulitis.   Aortic Atherosclerosis (ICD10-I70.0)    11/27/2023 -  Chemotherapy   Patient is on Treatment Plan : PANCREATIC Abraxane  D1,8,15 + Gemcitabine  D1,8,15 q28d      Hospitalization due to Atrial flutter: with RVR. S/p TEE & cardioverison on 12/28/23, severe sepsis, gastroenteritis.  01/30/2024 - 01/31/2024 patient was hospitalized due to CHF exacerbation, she also received antibiotics for E. coli UTI.  Today patient denies any nausea vomiting diarrhea.  No fever or chills.  She feels well. Tolerated gemcitabine  monotherapy well.    chronic bilateral lower extremity edema, skin breakdown. She did not get CT done     MEDICAL HISTORY:  Past Medical History:  Diagnosis Date   Anemia    Asthma    Back pain    Cataract    Diabetes mellitus without complication (HCC)    GERD (gastroesophageal reflux disease)    History of echocardiogram    Hypertension    Morbid obesity with BMI of 60.0-69.9, adult (HCC)    Restless leg syndrome     SURGICAL HISTORY: Past Surgical History:  Procedure Laterality Date   ABDOMINAL HYSTERECTOMY     CARDIOVERSION N/A 12/28/2023   Procedure: CARDIOVERSION;  Surgeon: Darliss Rogue, MD;  Location: [facility] ORS;  Service: Cardiovascular;  Laterality: N/A;   CATARACT EXTRACTION W/PHACO Left 04/13/2020   Procedure: CATARACT EXTRACTION PHACO AND INTRAOCULAR LENS PLACEMENT (IOC);  Surgeon: Ferol Rogue, MD;  Location: [facility] ORS;  Service: Ophthalmology;  Laterality: Left;  US  00:36.0 CDE 5.95 Fluid Pack lot # [id number]   HYSTEROSCOPY WITH D & C N/A 07/14/2017   Procedure: DILATATION AND CURETTAGE /HYSTEROSCOPY;  Surgeon: Arloa Lamar SQUIBB, MD;  Location: [facility] ORS;  Service: Gynecology;  Laterality: N/A;   IR  IMAGING GUIDED PORT INSERTION  12/17/2023   POLYPECTOMY  2015   TEE WITHOUT CARDIOVERSION N/A 12/28/2023   Procedure: ECHOCARDIOGRAM, TRANSESOPHAGEAL;  Surgeon: Darliss Rogue, MD;   Location: [facility] ORS;  Service: Cardiovascular;  Laterality: N/A;    SOCIAL HISTORY: Social History   Socioeconomic History   Marital status: Single    Spouse name: Not on file   Number of children: Not on file   Years of education: Not on file   Highest education level: Not on file  Occupational History   Not on file  Tobacco Use   Smoking status: Never   Smokeless tobacco: Never  Vaping Use   Vaping status: Never Used  Substance and Sexual Activity   Alcohol use: No   Drug use: No   Sexual activity: Never    Birth control/protection: None  Other Topics Concern   Not on file  Social History Narrative   Not on file   Social Drivers of Health   Financial Resource Strain: Low Risk  (09/26/2022)   Received from [facility]   Overall Financial Resource Strain (CARDIA)    Difficulty of Paying Living Expenses: Not hard at all  Food Insecurity: Patient Declined (01/30/2024)   Hunger Vital Sign    Worried About Running Out of Food in the Last Year: Patient declined    Ran Out of Food in the Last Year: Patient declined  Transportation Needs: Patient Declined (01/30/2024)   PRAPARE - Administrator, Civil Service (Medical): Patient declined    Lack of Transportation (Non-Medical): Patient declined  Physical Activity: Not on file  Stress: Not on file  Social Connections: Socially Isolated (12/25/2023)   Social Connection and Isolation Panel    Frequency of Communication with Friends and Family: More than three times a week    Frequency of Social Gatherings with Friends and Family: More than three times a week    Attends Religious Services: Never    Database administrator or Organizations: No    Attends Banker Meetings: Never    Marital Status: Never married  Intimate Partner Violence: Patient Declined (01/30/2024)   Humiliation, Afraid, Rape, and Kick questionnaire    Fear of Current or Ex-Partner: Patient declined    Emotionally Abused:  Patient declined    Physically Abused: Patient declined    Sexually Abused: Patient declined    FAMILY HISTORY: Family History  Problem Relation Age of Onset   Breast cancer Mother 86   Diabetes Mother    Hypertension Mother    Ovarian cancer Paternal Aunt        ?   Diabetes Father    Hypertension Father     ALLERGIES:  is allergic to peanut-containing drug products.  MEDICATIONS:  Current Outpatient Medications  Medication Sig Dispense Refill   acetaminophen  (TYLENOL  8 HOUR ARTHRITIS PAIN) 650 MG CR tablet Take 3 tablets (1,950 mg total) by mouth every 8 (eight) hours as needed for pain. Do not exceed 4000 mg total in a day.  Home med.     apixaban  (ELIQUIS ) 5 MG TABS tablet Take 1 tablet (5 mg total) by mouth 2 (two) times daily. 60 tablet 0   azelastine  (ASTELIN ) 0.1 % nasal spray Place 1 spray into both nostrils 2 (two) times daily.     baclofen  (LIORESAL ) 10 MG tablet Take 10 mg by mouth 3 (three) times daily.     diphenoxylate -atropine  (LOMOTIL ) 2.5-0.025 MG tablet Take 2 tablets by mouth every 6 (  six) hours as needed (for diarrhea unrelieved by imodium). 90 tablet 0   EPINEPHrine  0.3 mg/0.3 mL IJ SOAJ injection Inject 0.3 mg into the muscle as needed for anaphylaxis. 1 each 0   fluticasone  (FLONASE ) 50 MCG/ACT nasal spray Place 1 spray into both nostrils daily. 9.9 mL 0   fluticasone  furoate-vilanterol (BREO ELLIPTA ) 100-25 MCG/ACT AEPB Inhale 1 puff into the lungs daily. 28 each 1   furosemide  (LASIX ) 40 MG tablet Take 40 mg by mouth daily.     gabapentin  (NEURONTIN ) 300 MG capsule Take 1 capsule (300 mg total) by mouth 2 (two) times daily. 60 capsule 0   ipratropium-albuterol  (DUONEB) 0.5-2.5 (3) MG/3ML SOLN Take 3 mLs by nebulization every 4 (four) hours as needed (wheezing / shortness of breath). 360 mL 1   levocetirizine (XYZAL ) 5 MG tablet Take 5 mg by mouth daily.     lidocaine -prilocaine  (EMLA ) cream Apply 1 Application topically as needed. Apply to port and cover  with saran wrap 1-2 hours prior to port access 30 g 1   magic mouthwash (lidocaine , diphenhydrAMINE , alum & mag hydroxide) suspension Swish and swallow 5 mLs 4 (four) times daily as needed for mouth pain. 360 mL 1   metFORMIN  (GLUCOPHAGE ) 1000 MG tablet Take 1,000 mg by mouth 2 (two) times daily.     metoprolol  tartrate (LOPRESSOR ) 25 MG tablet Take 1 tablet (25 mg total) by mouth 2 (two) times daily. 60 tablet 0   morphine  (MS CONTIN ) 15 MG 12 hr tablet Take 1 tablet (15 mg total) by mouth every 12 (twelve) hours. 30 tablet 0   naloxone  (NARCAN ) nasal spray 4 mg/0.1 mL SPRAY 1 SPRAY INTO ONE NOSTRIL AS DIRECTED FOR OPIOID OVERDOSE (TURN PERSON ON SIDE AFTER DOSE. IF NO RESPONSE IN 2-3 MINUTES OR PERSON RESPONDS BUT RELAPSES, REPEAT USING A NEW SPRAY DEVICE AND SPRAY INTO THE OTHER NOSTRIL. CALL 911 AFTER USE.) * EMERGENCY USE ONLY * 1 each 0   ondansetron  (ZOFRAN -ODT) 4 MG disintegrating tablet Take 1 tablet (4 mg total) by mouth every 8 (eight) hours as needed for nausea or vomiting. 45 tablet 0   oxyCODONE  (OXY IR/ROXICODONE ) 5 MG immediate release tablet Take 1 tablet (5 mg total) by mouth every 4 (four) hours as needed for moderate pain (pain score 4-6) or severe pain (pain score 7-10). 45 tablet 0   potassium chloride  SA (KLOR-CON  M) 20 MEQ tablet Take 1 tablet (20 mEq total) by mouth daily. 3 tablet 0   prochlorperazine  (COMPAZINE ) 10 MG tablet Take 1 tablet (10 mg total) by mouth every 6 (six) hours as needed for nausea or vomiting. 30 tablet 1   rOPINIRole  (REQUIP ) 2 MG tablet Take 2 mg by mouth in the morning and at bedtime.      spironolactone  (ALDACTONE ) 50 MG tablet Take 1 tablet (50 mg total) by mouth daily. 30 tablet 1   traZODone  (DESYREL ) 50 MG tablet Take 0.5-1 tablets (25-50 mg total) by mouth at bedtime as needed for sleep. 30 tablet 2   VENTOLIN  HFA 108 (90 Base) MCG/ACT inhaler Inhale 2 puffs into the lungs every 4 (four) hours as needed for wheezing or shortness of breath. 18 g 1    No current facility-administered medications for this visit.   Facility-Administered Medications Ordered in Other Visits  Medication Dose Route Frequency Provider Last Rate Last Admin   0.9 %  sodium chloride  infusion   Intravenous Continuous Babara Call, MD 10 mL/hr at 04/28/24 0933 New Bag at 04/28/24 [phone]  Review of Systems  Constitutional:  Positive for fatigue. Negative for appetite change, chills and fever.  HENT:   Negative for hearing loss and voice change.   Eyes:  Negative for eye problems.  Respiratory:  Negative for chest tightness, cough and shortness of breath.   Cardiovascular:  Positive for leg swelling. Negative for chest pain.  Gastrointestinal:  Positive for nausea. Negative for abdominal distention, abdominal pain and blood in stool.  Endocrine: Negative for hot flashes.  Genitourinary:  Negative for difficulty urinating and frequency.   Musculoskeletal:  Negative for arthralgias.  Skin:  Negative for itching and rash.  Neurological:  Negative for extremity weakness.  Hematological:  Negative for adenopathy.  Psychiatric/Behavioral:  Negative for confusion.      PHYSICAL EXAMINATION: ECOG PERFORMANCE STATUS: 1 - Symptomatic but completely ambulatory  Vitals:   04/28/24 0906  BP: (!) 140/74  Pulse: 65  Resp: 19  Temp: 97.8 F (36.6 C)  SpO2: 99%   Filed Weights   04/28/24 0906  Weight: 246 lb 6.4 oz (111.8 kg)     Physical Exam Constitutional:      General: She is not in acute distress.    Appearance: She is obese. She is not diaphoretic.  HENT:     Head: Normocephalic and atraumatic.  Eyes:     General: No scleral icterus. Cardiovascular:     Rate and Rhythm: Normal rate and regular rhythm.  Pulmonary:     Effort: Pulmonary effort is normal. No respiratory distress.     Breath sounds: No wheezing or rales.     Comments: Decreased breath sound bilaterally Abdominal:     General: Bowel sounds are normal. There is no distension.      Palpations: Abdomen is soft.  Musculoskeletal:        General: Swelling present. Normal range of motion.     Cervical back: Normal range of motion and neck supple.     Right lower leg: Edema present.     Left lower leg: Edema present.  Skin:    General: Skin is warm and dry.     Findings: No erythema.  Neurological:     Mental Status: She is alert and oriented to person, place, and time. Mental status is at baseline.     Motor: No abnormal muscle tone.  Psychiatric:        Mood and Affect: Mood and affect normal.      LABORATORY DATA:  I have reviewed the data as listed    Latest Ref Rng & Units 04/28/2024    8:53 AM 04/14/2024    9:05 AM 03/31/2024    8:33 AM  CBC  WBC 4.0 - 10.5 K/uL 5.9  5.4  5.8   Hemoglobin 12.0 - 15.0 g/dL 88.1  87.8  88.2   Hematocrit 36.0 - 46.0 % 35.3  36.1  35.1   Platelets 150 - 400 K/uL 251  204  296       Latest Ref Rng & Units 04/28/2024    8:53 AM 04/14/2024    9:05 AM 03/31/2024    8:33 AM  CMP  Glucose 70 - 99 mg/dL 810  819  829   BUN 8 - 23 mg/dL 20  12  18    Creatinine 0.44 - 1.00 mg/dL 9.00  9.11  9.13   Sodium 135 - 145 mmol/L 135  135  135   Potassium 3.5 - 5.1 mmol/L 3.4  3.6  3.6   Chloride 98 - 111 mmol/L  98  101  100   CO2 22 - 32 mmol/L 27  25  27    Calcium  8.9 - 10.3 mg/dL 9.2  9.0  9.2   Total Protein 6.5 - 8.1 g/dL 7.9  7.9  8.1   Total Bilirubin 0.0 - 1.2 mg/dL 0.8  0.7  0.7   Alkaline Phos 38 - 126 U/L 153  140  140   AST 15 - 41 U/L 20  21  20    ALT 0 - 44 U/L 17  15  16       RADIOGRAPHIC STUDIES: I have personally reviewed the radiological images as listed and agreed with the findings in the report. No results found.

## 2024-04-28 NOTE — Assessment & Plan Note (Signed)
 Atrial flutter: with RVR. S/p TEE & cardioverison on 12/28/23  follow-up with cardiology.  Continue metoprolol  and Eliquis 

## 2024-04-28 NOTE — Assessment & Plan Note (Signed)
 Chemotherapy plan as listed above

## 2024-04-29 LAB — CANCER ANTIGEN 19-9: CA 19-9: <2 U/mL (ref 0–35)

## 2024-04-29 LAB — CEA: CEA: 75 ng/mL — ABNORMAL HIGH (ref 0.0–4.7)

## 2024-05-03 ENCOUNTER — Encounter: Payer: Self-pay | Admitting: Oncology

## 2024-05-04 ENCOUNTER — Other Ambulatory Visit: Payer: Self-pay | Admitting: *Deleted

## 2024-05-04 ENCOUNTER — Inpatient Hospital Stay: Admitting: Hospice and Palliative Medicine

## 2024-05-04 DIAGNOSIS — G893 Neoplasm related pain (acute) (chronic): Secondary | ICD-10-CM | POA: Diagnosis not present

## 2024-05-04 DIAGNOSIS — G620 Drug-induced polyneuropathy: Secondary | ICD-10-CM | POA: Diagnosis not present

## 2024-05-04 DIAGNOSIS — C259 Malignant neoplasm of pancreas, unspecified: Secondary | ICD-10-CM

## 2024-05-04 DIAGNOSIS — C787 Secondary malignant neoplasm of liver and intrahepatic bile duct: Secondary | ICD-10-CM

## 2024-05-04 DIAGNOSIS — Z515 Encounter for palliative care: Secondary | ICD-10-CM

## 2024-05-04 MED ORDER — MORPHINE SULFATE ER 15 MG PO TBCR: 15.0000 mg | EXTENDED_RELEASE_TABLET | Freq: Two times a day (BID) | ORAL | 0 refills | Status: DC

## 2024-05-04 NOTE — Progress Notes (Signed)
 Virtual Visit via Telephone Note  I connected with Kristina Cruz on 05/04/24 at 11:30 AM EDT by telephone and verified that I am speaking with the correct person using two identifiers.  Location: Patient: Home Provider: Clinic   I discussed the limitations, risks, security and privacy concerns of performing an evaluation and management service by telephone and the availability of in person appointments. I also discussed with the patient that there may be a patient responsible charge related to this service. The patient expressed understanding and agreed to proceed.   History of Present Illness: Kristina Cruz is a 63 y.o. 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 Palliative care consulted to address goals and manage ongoing symptoms.      Observations/Objective: Telephone visit today for follow-up.  Patient says that she has had worse generalized pain.  She was not regularly taking her MS Contin  but says that she recently restarted this every 12 hours and that has greatly helped the pain.  She says she finds MS Contin  more effective than the oxycodone  and longer-lasting.  Patient request refill of MS Contin  today.  Denies any adverse effects from pain medications.  Denies any other symptomatic concerns.  Assessment and Plan: Stage IV pancreatic cancer -on gemcitabine   Neoplasm related pain -Continue oxycodone .  Refill MS Contin  #60 daily bowel regimen to prevent opioid-induced constipation.  PDMP reviewed.  Chemo-induced peripheral neuropathy -continue gabapentin   Follow Up Instructions: Follow-up telephone visit 1 month   I discussed the assessment and treatment plan with the patient. The patient was provided an opportunity to ask questions and all were answered. The patient agreed with the plan and demonstrated an understanding of the instructions.   The patient was  advised to call back or seek an in-person evaluation if the symptoms worsen or if the condition fails to improve as anticipated.  I provided 10 minutes of non-face-to-face time during this encounter.   FONDA JONELLE MOWER, NP

## 2024-05-04 NOTE — Telephone Encounter (Signed)
 Per Luke in radiation, patient called this morning to request a RF on Ms Contin - script pended- script has not been filled since April 2025

## 2024-05-12 ENCOUNTER — Emergency Department

## 2024-05-12 ENCOUNTER — Other Ambulatory Visit: Payer: Self-pay

## 2024-05-12 ENCOUNTER — Inpatient Hospital Stay: Admitting: Hospice and Palliative Medicine

## 2024-05-12 ENCOUNTER — Inpatient Hospital Stay

## 2024-05-12 ENCOUNTER — Emergency Department
Admission: EM | Admit: 2024-05-12 | Discharge: 2024-05-12 | Disposition: A | Discharge status: Home / Self Care | Attending: Emergency Medicine | Admitting: Emergency Medicine

## 2024-05-12 ENCOUNTER — Inpatient Hospital Stay (HOSPITAL_BASED_OUTPATIENT_CLINIC_OR_DEPARTMENT_OTHER): Admitting: Oncology

## 2024-05-12 ENCOUNTER — Encounter: Payer: Self-pay | Admitting: Oncology

## 2024-05-12 ENCOUNTER — Telehealth: Payer: Self-pay

## 2024-05-12 VITALS — BP 163/96 | HR 59 | Temp 98.2°F | Resp 18 | Wt 241.3 lb

## 2024-05-12 DIAGNOSIS — C259 Malignant neoplasm of pancreas, unspecified: Secondary | ICD-10-CM

## 2024-05-12 DIAGNOSIS — I2699 Other pulmonary embolism without acute cor pulmonale: Secondary | ICD-10-CM

## 2024-05-12 DIAGNOSIS — C787 Secondary malignant neoplasm of liver and intrahepatic bile duct: Secondary | ICD-10-CM | POA: Diagnosis not present

## 2024-05-12 DIAGNOSIS — Z8507 Personal history of malignant neoplasm of pancreas: Secondary | ICD-10-CM | POA: Diagnosis not present

## 2024-05-12 DIAGNOSIS — Z5111 Encounter for antineoplastic chemotherapy: Secondary | ICD-10-CM | POA: Diagnosis not present

## 2024-05-12 DIAGNOSIS — E119 Type 2 diabetes mellitus without complications: Secondary | ICD-10-CM | POA: Diagnosis not present

## 2024-05-12 DIAGNOSIS — I4891 Unspecified atrial fibrillation: Secondary | ICD-10-CM | POA: Diagnosis not present

## 2024-05-12 DIAGNOSIS — T451X5A Adverse effect of antineoplastic and immunosuppressive drugs, initial encounter: Secondary | ICD-10-CM

## 2024-05-12 DIAGNOSIS — R079 Chest pain, unspecified: Secondary | ICD-10-CM

## 2024-05-12 DIAGNOSIS — Z7901 Long term (current) use of anticoagulants: Secondary | ICD-10-CM | POA: Insufficient documentation

## 2024-05-12 DIAGNOSIS — R0789 Other chest pain: Secondary | ICD-10-CM | POA: Diagnosis present

## 2024-05-12 DIAGNOSIS — G620 Drug-induced polyneuropathy: Secondary | ICD-10-CM | POA: Diagnosis not present

## 2024-05-12 DIAGNOSIS — G893 Neoplasm related pain (acute) (chronic): Secondary | ICD-10-CM

## 2024-05-12 DIAGNOSIS — I482 Chronic atrial fibrillation, unspecified: Secondary | ICD-10-CM | POA: Insufficient documentation

## 2024-05-12 LAB — CMP (CANCER CENTER ONLY)
ALT: 17 U/L (ref 0–44)
AST: 23 U/L (ref 15–41)
Albumin: 3.5 g/dL (ref 3.5–5.0)
Alkaline Phosphatase: 156 U/L — ABNORMAL HIGH (ref 38–126)
Anion gap: 8 (ref 5–15)
BUN: 12 mg/dL (ref 8–23)
CO2: 25 mmol/L (ref 22–32)
Calcium: 9 mg/dL (ref 8.9–10.3)
Chloride: 103 mmol/L (ref 98–111)
Creatinine: 0.91 mg/dL (ref 0.44–1.00)
GFR, Estimated: >60 mL/min (ref >60)
Glucose, Bld: 179 mg/dL — ABNORMAL HIGH (ref 70–99)
Potassium: 3.5 mmol/L (ref 3.5–5.1)
Sodium: 136 mmol/L (ref 135–145)
Total Bilirubin: 0.9 mg/dL (ref 0.0–1.2)
Total Protein: 7.9 g/dL (ref 6.5–8.1)

## 2024-05-12 LAB — CBC WITH DIFFERENTIAL (CANCER CENTER ONLY)
Abs Immature Granulocytes: 0.03 K/uL (ref 0.00–0.07)
Basophils Absolute: 0 K/uL (ref 0.0–0.1)
Basophils Relative: 1 %
Eosinophils Absolute: 0.2 K/uL (ref 0.0–0.5)
Eosinophils Relative: 4 %
HCT: 34.2 % — ABNORMAL LOW (ref 36.0–46.0)
Hemoglobin: 11.3 g/dL — ABNORMAL LOW (ref 12.0–15.0)
Immature Granulocytes: 1 %
Lymphocytes Relative: 25 %
Lymphs Abs: 1.5 K/uL (ref 0.7–4.0)
MCH: 30.8 pg (ref 26.0–34.0)
MCHC: 33 g/dL (ref 30.0–36.0)
MCV: 93.2 fL (ref 80.0–100.0)
Monocytes Absolute: 0.7 K/uL (ref 0.1–1.0)
Monocytes Relative: 13 %
Neutro Abs: 3.3 K/uL (ref 1.7–7.7)
Neutrophils Relative %: 56 %
Platelet Count: 233 K/uL (ref 150–400)
RBC: 3.67 MIL/uL — ABNORMAL LOW (ref 3.87–5.11)
RDW: 16.5 % — ABNORMAL HIGH (ref 11.5–15.5)
WBC Count: 5.8 K/uL (ref 4.0–10.5)
nRBC: 0 % (ref 0.0–0.2)

## 2024-05-12 LAB — TROPONIN I (HIGH SENSITIVITY)
Troponin I (High Sensitivity): 6 ng/L (ref <18)
Troponin I (High Sensitivity): 6 ng/L (ref <18)

## 2024-05-12 MED ORDER — HYDROMORPHONE HCL 1 MG/ML IJ SOLN
0.5000 mg | Freq: Once | INTRAMUSCULAR | Status: AC
  Administered 2024-05-12: 0.5 mg via INTRAVENOUS
  Filled 2024-05-12: qty 0.5

## 2024-05-12 MED ORDER — HEPARIN SOD (PORK) LOCK FLUSH 100 UNIT/ML IV SOLN
INTRAVENOUS | Status: AC
  Administered 2024-05-12: 500 [IU]
  Filled 2024-05-12: qty 5

## 2024-05-12 MED ORDER — IOHEXOL 350 MG/ML SOLN
75.0000 mL | Freq: Once | INTRAVENOUS | Status: AC | PRN
  Administered 2024-05-12: 75 mL via INTRAVENOUS

## 2024-05-12 MED ORDER — APIXABAN 5 MG PO TABS: 5.0000 mg | ORAL_TABLET | Freq: Two times a day (BID) | ORAL | 3 refills | Status: DC

## 2024-05-12 MED ORDER — APIXABAN 5 MG PO TABS
10.0000 mg | ORAL_TABLET | Freq: Once | ORAL | Status: AC
  Administered 2024-05-12: 10 mg via ORAL
  Filled 2024-05-12: qty 2

## 2024-05-12 NOTE — Telephone Encounter (Signed)
 Patient c/o of chest pain, transported to ER, port needle left in place, MD called report

## 2024-05-12 NOTE — Progress Notes (Deleted)
 Patient c/o of chest pain, transported to ER, port needle left in place, MD called report

## 2024-05-12 NOTE — Assessment & Plan Note (Signed)
 stage IV pancreatic cancer with liver metastasis was discussed with patient.  Other differential diagnosis include GI tract adenocarcinoma, cholangiocarcinoma etc. Baseline CEA is elevated.  CA 19-9 is normal. CT and MRI imaging is did not show any GI primary.I recommend a PET scan for further evaluation- patient rescheduled appt initially and later on PET scan was not done due to admission. I offered to call her daughter to give her updates and patient declines.   Labs are reviewed and discussed with patient. Patient did not tolerate Gemciabine and Abraxane  well.  On dose reduced monotherapy Gemcitaibine 900mg /m2 1 week on 1 week off   Hold cycle 5 D1 gemcitabine  due to chest pain. She will go to ER for evaluation.  CEA is trending down. She previously no showed to CT scan which is rescheduled to 05/19/2024  She has established care with palliative care service.

## 2024-05-12 NOTE — ED Notes (Signed)
Lab contacted to collect troponin

## 2024-05-12 NOTE — ED Provider Notes (Signed)
 [facility] Provider Note    Event Date/Time   First MD Initiated Contact with Patient 05/12/24 1109     (approximate)   History   Chest Pain   HPI  Kristina Cruz is a 63 y.o. female who presents to the ED for evaluation of Chest Pain   I reviewed oncology clinic visit from earlier today.  History of stage IV pancreatic cancer with liver mets.  History of A-fib on Eliquis , BMI 45, DM  Patient presents to the ED for evaluation of 2 weeks of left-sided chest discomfort.  Denies any shortness of breath, nausea, emesis.  Reports poor compliance with Eliquis   Physical Exam   Triage Vital Signs: ED Triage Vitals [05/12/24 1023]  Encounter Vitals Group     BP 111/71     Girls Systolic BP Percentile      Girls Diastolic BP Percentile      Boys Systolic BP Percentile      Boys Diastolic BP Percentile      Pulse Rate 61     Resp 20     Temp 97.8 F (36.6 C)     Temp src      SpO2 100 %     Weight 240 lb 4.8 oz (109 kg)     Height 5' 1 (1.549 m)     Head Circumference      Peak Flow      Pain Score      Pain Loc      Pain Education      Exclude from Growth Chart     Most recent vital signs: Vitals:   05/12/24 1023  BP: 111/71  Pulse: 61  Resp: 20  Temp: 97.8 F (36.6 C)  SpO2: 100%    General: Awake, no distress.  CV:  Good peripheral perfusion.  Resp:  Normal effort.  Abd:  No distention.  MSK:  No deformity noted.  Neuro:  No focal deficits appreciated. Other:     ED Results / Procedures / Treatments   Labs (all labs ordered are listed, but only abnormal results are displayed) Labs Reviewed  TROPONIN I (HIGH SENSITIVITY)  TROPONIN I (HIGH SENSITIVITY)    EKG Sinus rhythm with a rate of 58 bpm, right bundle morphology, no clear signs of acute ischemia.  Signs of LVH.  Similar morphology as comparison from June  RADIOLOGY CXR interpreted by me without evidence of acute cardiopulmonary pathology.  Official  radiology report(s): DG Chest 2 View Result Date: 05/12/2024 EXAM: 2 VIEW(S) XRAY OF THE CHEST 05/12/2024 10:55:00 AM COMPARISON: 01/30/2024 CLINICAL HISTORY: cp. Pt to ED from cancer center for chest pain x2 weeks. +nausea. Pt had labs drawn at cancer center this am. FINDINGS: LINES, TUBES AND DEVICES: Stable right IJ Port-A-Cath in place. LUNGS AND PLEURA: No focal pulmonary opacity. No pulmonary edema. No pleural effusion. No pneumothorax. HEART AND MEDIASTINUM: Cardiomegaly, unchanged. No acute abnormality of the mediastinal silhouette. BONES AND SOFT TISSUES: Thoracic spondylosis. No acute osseous abnormality. IMPRESSION: 1. No acute cardiopulmonary abnormality. 2. Cardiomegaly, unchanged. Electronically signed by: Waddell Calk MD 05/12/2024 12:03 PM EDT RP Workstation: [id number]    PROCEDURES and INTERVENTIONS:  .1-3 Lead EKG Interpretation  Performed by: Claudene Rover, MD Authorized by: Claudene Rover, MD     Interpretation: normal     ECG rate:  60   ECG rate assessment: normal     Rhythm: sinus rhythm     Ectopy: none     Conduction: normal  Medications  HYDROmorphone  (DILAUDID ) injection 0.5 mg (0.5 mg Intravenous Given 05/12/24 1445)  iohexol  (OMNIPAQUE ) 350 MG/ML injection 75 mL (75 mLs Intravenous Contrast Given 05/12/24 1515)     IMPRESSION / MDM / ASSESSMENT AND PLAN / ED COURSE  I reviewed the triage vital signs and the nursing notes.  Differential diagnosis includes, but is not limited to, ACS, PTX, PNA, muscle strain/spasm, PE, dissection, anxiety, pleural effusion  {Patient presents with symptoms of an acute illness or injury that is potentially life-threatening.  Patient presents with subacute atypical chest pain.  EKG is not ischemic, first troponin is negative.  Had blood work at the cancer center earlier today with reassuring labs.  Clear CXR.  Due to poor compliance with Eliquis  , will obtain CTA chest to ensure no PE.  She is eager to go home and may be  suitable for outpatient management after this.  Signed out to oncoming physician.  Clinical Course as of 05/12/24 1515  Thu May 12, 2024  1505 P: CTA chest, likely DC [EB]    Clinical Course User Index [EB] Jossie Artist POUR, MD     FINAL CLINICAL IMPRESSION(S) / ED DIAGNOSES   Final diagnoses:  Other chest pain     Rx / DC Orders   ED Discharge Orders     None        Note:  This document was prepared using Dragon voice recognition software and may include unintentional dictation errors.   Claudene Rover, MD 05/12/24 [phone]

## 2024-05-12 NOTE — ED Triage Notes (Signed)
 Pt to ED from cancer center for chest pain x2 weeks. +nausea.  Pt had labs drawn at cancer center this am.

## 2024-05-12 NOTE — Assessment & Plan Note (Signed)
 Atrial flutter: with RVR. S/p TEE & cardioverison on 12/28/23  follow-up with cardiology.  Continue metoprolol  and Eliquis   she is not sure if she is still taking Eliquis .

## 2024-05-12 NOTE — Assessment & Plan Note (Signed)
 Recommend gabapentin  100mg  TID PRN.

## 2024-05-12 NOTE — ED Notes (Signed)
 RN called phlebotomy for blood collection. RN unable to get blood.

## 2024-05-12 NOTE — Assessment & Plan Note (Signed)
 continue OxyContin  10 mg every 12 hours oxycodone  5 mg every 4-6 hours as needed -  .

## 2024-05-12 NOTE — Assessment & Plan Note (Signed)
 I advise patient to go to ER for further evaluation.

## 2024-05-12 NOTE — Progress Notes (Signed)
 Hematology/Oncology Progress note [phone] [phone]      CHIEF COMPLAINTS/PURPOSE OF CONSULTATION:  Stage IV pancreatic carcinoma with liver metastasis.  ASSESSMENT & PLAN:   Malignant neoplasm of pancreas (HCC) stage IV pancreatic cancer with liver metastasis was discussed with patient.  Other differential diagnosis include GI tract adenocarcinoma, cholangiocarcinoma etc. Baseline CEA is elevated.  CA 19-9 is normal. CT and MRI imaging is did not show any GI primary.I recommend a PET scan for further evaluation- patient rescheduled appt initially and later on PET scan was not done due to admission. I offered to call her daughter to give her updates and patient declines.   Labs are reviewed and discussed with patient. Patient did not tolerate Gemciabine and Abraxane  well.  On dose reduced monotherapy Gemcitaibine 900mg /m2 1 week on 1 week off   Hold cycle 5 D1 gemcitabine  due to chest pain. She will go to ER for evaluation.  CEA is trending down. She previously no showed to CT scan which is rescheduled to 05/19/2024  She has established care with palliative care service.     Atrial fibrillation with RVR (HCC) Atrial flutter: with RVR. S/p TEE & cardioverison on 12/28/23  follow-up with cardiology.  Continue metoprolol  and Eliquis   she is not sure if she is still taking Eliquis .   Chemotherapy-induced neuropathy Recommend gabapentin  100mg  TID PRN.    Neoplasm related pain continue OxyContin  10 mg every 12 hours oxycodone  5 mg every 4-6 hours as needed -  .   Chest pain I advise patient to go to ER for further evaluation.     No orders of the defined types were placed in this encounter.  Follow-up 2 weeks All questions were answered. The patient knows to call the clinic with any problems, questions or concerns.  [name], MD, PhD [facility] Hematology Oncology 05/12/2024    HISTORY OF PRESENTING ILLNESS:  Kristina Cruz 63 y.o. female  presents to establish care for Stage IV pancreatic cancer I have reviewed her chart and materials related to her cancer extensively and collaborated history with the patient. Summary of oncologic history is as follows: Oncology History  Malignant neoplasm of pancreas (HCC)  08/21/2023 Imaging   CT abdomen pelvis with contrast showed 1. Masslike thickening involving the proximal and mid tail of pancreas with relative hypoenhancement measures approximately 3.9 x 5.5 cm. In the absence of signs/symptoms of acute pancreatitis underlying neoplastic process cannot be excluded. Further evaluation with nonemergent contrast enhanced MRI of the abdomen is advised. Note: Given patient's body habitus and the motion artifact observed on the current exam an MRI obtained at this time is likely to be severely limited and likely nondiagnostic. MRI should be obtained only once the patient is clinically stable, and is able to remain motionless and breath hold. 2. Patchy ground-glass and airspace densities identified within both lower lobes. These are new compared with the previous exam and are concerning for underlying inflammatory/infectious process. 3. Sigmoid diverticulosis without signs of acute diverticulitis. 4. Marked diastasis recti with ventral herniation of the large and small bowel loops. 5. Periumbilical hernia is containing fat only.   08/24/2023 Imaging   MRI abdomen wo contrast  1. Moderately suboptimal unenhanced exam. 2. Heterogeneous masslike thickening of the pancreatic body/tail again seen. The lesion remains indeterminate on this exam. Differential diagnosis includes pancreatic tumor, autoimmune pancreatitis, mass forming chronic pancreatitis, etc. Please see above for follow-up recommendations. 3. Mild diffuse hepatic steatosis. There are indeterminate areas in the liver, which can also  be better evaluated on the contrast-enhanced MRI abdomen. 4. Other observations, as described  above.   11/05/2023 Initial Diagnosis   Pancreatic cancer metastasized to liver  Patient was hospitalized from 11/05/2023 - 11/10/2023 She presented to emergency room due to nausea, vomiting and abdominal pain. CT scan showed multiple hypodense hepatic lesions increasing number and size from prior imaging.  Pancreatic tail mass, hypodense lesion in the anterior spleen.  11/09/2023 ultrasound-guided biopsy was obtained. Pathology showed moderately to poorly differentiated adenocarcinoma with extensive areas of tumor necrosis. The adenocarcinoma is positive for cytokeratin 7 and CDX2 (focal).     Cytokeratin 20, TTF-1, and GATA3 are negative. The pattern of immunoreactivity is not specific and the differential diagnosis includes metastatic adenocarcinoma from the pancreaticobiliary tract and upper GI. Intrahepatic       cholangiocarcinoma is also a diagnostic possibility. Given the radiographic  findings a pancreaticobiliary primary is favored and correlation with clinical impression is required.    11/17/2023 Cancer Staging   Staging form: Exocrine Pancreas, AJCC 8th Edition - Clinical stage from 11/17/2023: Stage IV (cT3, cNX, pM1) - Signed by Babara Call, MD on 11/17/2023 Stage prefix: Initial diagnosis    Imaging   CT abdomen pelvis w contrast  1. Multiple hypodense hepatic lesions, increased in number and size from prior imaging, highly suspicious for metastatic disease. 2. Hypodense lesion in the pancreatic tail measures 7.2 x 3.9 cm, increased in size from prior. The increase in size favors pancreatic neoplasm, with additional etiologies less likely. 3. New hypodense lesion in the anterior spleen, 13 mm, suspicious for metastatic disease. 4. Laxity of the anterior abdominal wall with midline ventral abdominal wall hernia containing small and to a lesser extent large bowel. No bowel obstruction or inflammation. 5. Colonic diverticulosis without diverticulitis.   Aortic Atherosclerosis  (ICD10-I70.0)    11/27/2023 -  Chemotherapy   Patient is on Treatment Plan : PANCREATIC Abraxane  D1,8,15 + Gemcitabine  D1,8,15 q28d      Hospitalization due to Atrial flutter: with RVR. S/p TEE & cardioverison on 12/28/23, severe sepsis, gastroenteritis.  01/30/2024 - 01/31/2024 patient was hospitalized due to CHF exacerbation, she also received antibiotics for E. coli UTI.  Today she reports experiencing chest pain for the past two weeks, described as a pressure sensation located in the chest area. No nausea vomiting or SOB.   In addition to chest pain, she has mild abdominal pain that began after the onset of the chest pain.    MEDICAL HISTORY:  Past Medical History:  Diagnosis Date   Anemia    Asthma    Back pain    Cataract    Diabetes mellitus without complication (HCC)    GERD (gastroesophageal reflux disease)    History of echocardiogram    Hypertension    Morbid obesity with BMI of 60.0-69.9, adult (HCC)    Restless leg syndrome     SURGICAL HISTORY: Past Surgical History:  Procedure Laterality Date   ABDOMINAL HYSTERECTOMY     CARDIOVERSION N/A 12/28/2023   Procedure: CARDIOVERSION;  Surgeon: Darliss Rogue, MD;  Location: [facility] ORS;  Service: Cardiovascular;  Laterality: N/A;   CATARACT EXTRACTION W/PHACO Left 04/13/2020   Procedure: CATARACT EXTRACTION PHACO AND INTRAOCULAR LENS PLACEMENT (IOC);  Surgeon: Ferol Rogue, MD;  Location: [facility] ORS;  Service: Ophthalmology;  Laterality: Left;  US  00:36.0 CDE 5.95 Fluid Pack lot # [id number]   HYSTEROSCOPY WITH D & C N/A 07/14/2017   Procedure: DILATATION AND CURETTAGE /HYSTEROSCOPY;  Surgeon: Arloa Lamar SQUIBB, MD;  Location:  [facility] ORS;  Service: Gynecology;  Laterality: N/A;   IR IMAGING GUIDED PORT INSERTION  12/17/2023   POLYPECTOMY  2015   TEE WITHOUT CARDIOVERSION N/A 12/28/2023   Procedure: ECHOCARDIOGRAM, TRANSESOPHAGEAL;  Surgeon: Darliss Rogue, MD;  Location: [facility] ORS;  Service: Cardiovascular;  Laterality:  N/A;    SOCIAL HISTORY: Social History   Socioeconomic History   Marital status: Single    Spouse name: Not on file   Number of children: Not on file   Years of education: Not on file   Highest education level: Not on file  Occupational History   Not on file  Tobacco Use   Smoking status: Never   Smokeless tobacco: Never  Vaping Use   Vaping status: Never Used  Substance and Sexual Activity   Alcohol use: No   Drug use: No   Sexual activity: Never    Birth control/protection: None  Other Topics Concern   Not on file  Social History Narrative   Not on file   Social Drivers of Health   Financial Resource Strain: Low Risk  (05/11/2024)   [facility]   Overall Financial Resource Strain (CARDIA)    Difficulty of Paying Living Expenses: Not hard at all  Food Insecurity: No Food Insecurity (05/11/2024)   Received from [facility]   Hunger Vital Sign    Within the past 12 months, you worried that your food would run out before you got the money to buy more.: Never true    Within the past 12 months, the food you bought just didn't last and you didn't have money to get more.: Never true  Transportation Needs: No Transportation Needs (05/11/2024)   Received from [facility] - Transportation    In the past 12 months, has lack of transportation kept you from medical appointments or from getting medications?: No    Lack of Transportation (Non-Medical): No  Physical Activity: Not on file  Stress: Not on file  Social Connections: Socially Isolated (12/25/2023)   Social Connection and Isolation Panel    Frequency of Communication with Friends and Family: More than three times a week    Frequency of Social Gatherings with Friends and Family: More than three times a week    Attends Religious Services: Never    Database administrator or Organizations: No    Attends Banker Meetings: Never     Marital Status: Never married  Intimate Partner Violence: Patient Declined (01/30/2024)   Humiliation, Afraid, Rape, and Kick questionnaire    Fear of Current or Ex-Partner: Patient declined    Emotionally Abused: Patient declined    Physically Abused: Patient declined    Sexually Abused: Patient declined    FAMILY HISTORY: Family History  Problem Relation Age of Onset   Breast cancer Mother 24   Diabetes Mother    Hypertension Mother    Ovarian cancer Paternal Aunt        ?   Diabetes Father    Hypertension Father     ALLERGIES:  is allergic to peanut-containing drug products.  MEDICATIONS:  Current Outpatient Medications  Medication Sig Dispense Refill   acetaminophen  (TYLENOL  8 HOUR ARTHRITIS PAIN) 650 MG CR tablet Take 3 tablets (1,950 mg total) by mouth every 8 (eight) hours as needed for pain. Do not exceed 4000 mg total in a day.  Home med.     apixaban  (ELIQUIS ) 5 MG TABS tablet Take 1 tablet (  5 mg total) by mouth 2 (two) times daily. 60 tablet 0   azelastine  (ASTELIN ) 0.1 % nasal spray Place 1 spray into both nostrils 2 (two) times daily.     baclofen  (LIORESAL ) 10 MG tablet Take 10 mg by mouth 3 (three) times daily.     diphenoxylate -atropine  (LOMOTIL ) 2.5-0.025 MG tablet Take 2 tablets by mouth every 6 (six) hours as needed (for diarrhea unrelieved by imodium). 90 tablet 0   EPINEPHrine  0.3 mg/0.3 mL IJ SOAJ injection Inject 0.3 mg into the muscle as needed for anaphylaxis. 1 each 0   fluticasone  (FLONASE ) 50 MCG/ACT nasal spray Place 1 spray into both nostrils daily. 9.9 mL 0   fluticasone  furoate-vilanterol (BREO ELLIPTA ) 100-25 MCG/ACT AEPB Inhale 1 puff into the lungs daily. 28 each 1   furosemide  (LASIX ) 40 MG tablet Take 40 mg by mouth daily.     gabapentin  (NEURONTIN ) 300 MG capsule Take 1 capsule (300 mg total) by mouth 2 (two) times daily. 60 capsule 0   ipratropium-albuterol  (DUONEB) 0.5-2.5 (3) MG/3ML SOLN Take 3 mLs by nebulization every 4 (four) hours as  needed (wheezing / shortness of breath). 360 mL 1   levocetirizine (XYZAL ) 5 MG tablet Take 5 mg by mouth daily.     lidocaine -prilocaine  (EMLA ) cream Apply 1 Application topically as needed. Apply to port and cover with saran wrap 1-2 hours prior to port access 30 g 1   magic mouthwash (lidocaine , diphenhydrAMINE , alum & mag hydroxide) suspension Swish and swallow 5 mLs 4 (four) times daily as needed for mouth pain. 360 mL 1   metFORMIN  (GLUCOPHAGE ) 1000 MG tablet Take 1,000 mg by mouth 2 (two) times daily.     metoprolol  tartrate (LOPRESSOR ) 25 MG tablet Take 1 tablet (25 mg total) by mouth 2 (two) times daily. 60 tablet 0   morphine  (MS CONTIN ) 15 MG 12 hr tablet Take 1 tablet (15 mg total) by mouth every 12 (twelve) hours. 60 tablet 0   naloxone  (NARCAN ) nasal spray 4 mg/0.1 mL SPRAY 1 SPRAY INTO ONE NOSTRIL AS DIRECTED FOR OPIOID OVERDOSE (TURN PERSON ON SIDE AFTER DOSE. IF NO RESPONSE IN 2-3 MINUTES OR PERSON RESPONDS BUT RELAPSES, REPEAT USING A NEW SPRAY DEVICE AND SPRAY INTO THE OTHER NOSTRIL. CALL 911 AFTER USE.) * EMERGENCY USE ONLY * 1 each 0   ondansetron  (ZOFRAN -ODT) 4 MG disintegrating tablet Take 1 tablet (4 mg total) by mouth every 8 (eight) hours as needed for nausea or vomiting. 45 tablet 0   oxyCODONE  (OXY IR/ROXICODONE ) 5 MG immediate release tablet Take 1 tablet (5 mg total) by mouth every 4 (four) hours as needed for moderate pain (pain score 4-6) or severe pain (pain score 7-10). 45 tablet 0   potassium chloride  SA (KLOR-CON  M) 20 MEQ tablet Take 1 tablet (20 mEq total) by mouth daily. 3 tablet 0   prochlorperazine  (COMPAZINE ) 10 MG tablet Take 1 tablet (10 mg total) by mouth every 6 (six) hours as needed for nausea or vomiting. 30 tablet 1   rOPINIRole  (REQUIP ) 2 MG tablet Take 2 mg by mouth in the morning and at bedtime.      spironolactone  (ALDACTONE ) 50 MG tablet Take 1 tablet (50 mg total) by mouth daily. 30 tablet 1   traZODone  (DESYREL ) 50 MG tablet Take 0.5-1 tablets  (25-50 mg total) by mouth at bedtime as needed for sleep. 30 tablet 2   VENTOLIN  HFA 108 (90 Base) MCG/ACT inhaler Inhale 2 puffs into the lungs every 4 (four) hours  as needed for wheezing or shortness of breath. 18 g 1   No current facility-administered medications for this visit.    Review of Systems  Constitutional:  Positive for fatigue. Negative for appetite change, chills and fever.  HENT:   Negative for hearing loss and voice change.   Eyes:  Negative for eye problems.  Respiratory:  Negative for chest tightness, cough and shortness of breath.   Cardiovascular:  Positive for chest pain and leg swelling.  Gastrointestinal:  Positive for nausea. Negative for abdominal distention, abdominal pain and blood in stool.  Endocrine: Negative for hot flashes.  Genitourinary:  Negative for difficulty urinating and frequency.   Musculoskeletal:  Negative for arthralgias.  Skin:  Negative for itching and rash.  Neurological:  Negative for extremity weakness.  Hematological:  Negative for adenopathy.  Psychiatric/Behavioral:  Negative for confusion.      PHYSICAL EXAMINATION: ECOG PERFORMANCE STATUS: 1 - Symptomatic but completely ambulatory  Vitals:   05/12/24 0938 05/12/24 0947  BP: (!) 167/80 (!) 163/96  Pulse: (!) 59   Resp: 18   Temp: 98.2 F (36.8 C)   SpO2: 100%    Filed Weights   05/12/24 0938  Weight: 241 lb 4.8 oz (109.5 kg)     Physical Exam Constitutional:      General: She is not in acute distress.    Appearance: She is obese. She is not diaphoretic.  HENT:     Head: Normocephalic and atraumatic.  Eyes:     General: No scleral icterus. Cardiovascular:     Rate and Rhythm: Normal rate and regular rhythm.  Pulmonary:     Effort: Pulmonary effort is normal. No respiratory distress.     Breath sounds: No wheezing or rales.     Comments: Decreased breath sound bilaterally Abdominal:     General: Bowel sounds are normal. There is no distension.     Palpations:  Abdomen is soft.  Musculoskeletal:        General: Swelling present. Normal range of motion.     Cervical back: Normal range of motion and neck supple.     Right lower leg: Edema present.     Left lower leg: Edema present.  Skin:    General: Skin is warm and dry.     Findings: No erythema.  Neurological:     Mental Status: She is alert and oriented to person, place, and time. Mental status is at baseline.     Motor: No abnormal muscle tone.  Psychiatric:        Mood and Affect: Mood and affect normal.      LABORATORY DATA:  I have reviewed the data as listed    Latest Ref Rng & Units 05/12/2024    9:23 AM 04/28/2024    8:53 AM 04/14/2024    9:05 AM  CBC  WBC 4.0 - 10.5 K/uL 5.8  5.9  5.4   Hemoglobin 12.0 - 15.0 g/dL 88.6  88.1  87.8   Hematocrit 36.0 - 46.0 % 34.2  35.3  36.1   Platelets 150 - 400 K/uL 233  251  204       Latest Ref Rng & Units 05/12/2024    9:23 AM 04/28/2024    8:53 AM 04/14/2024    9:05 AM  CMP  Glucose 70 - 99 mg/dL 820  810  819   BUN 8 - 23 mg/dL 12  20  12    Creatinine 0.44 - 1.00 mg/dL 9.08  9.00  9.11  Sodium 135 - 145 mmol/L 136  135  135   Potassium 3.5 - 5.1 mmol/L 3.5  3.4  3.6   Chloride 98 - 111 mmol/L 103  98  101   CO2 22 - 32 mmol/L 25  27  25    Calcium  8.9 - 10.3 mg/dL 9.0  9.2  9.0   Total Protein 6.5 - 8.1 g/dL 7.9  7.9  7.9   Total Bilirubin 0.0 - 1.2 mg/dL 0.9  0.8  0.7   Alkaline Phos 38 - 126 U/L 156  153  140   AST 15 - 41 U/L 23  20  21    ALT 0 - 44 U/L 17  17  15       RADIOGRAPHIC STUDIES: I have personally reviewed the radiological images as listed and agreed with the findings in the report. No results found.

## 2024-05-12 NOTE — ED Notes (Signed)
 RN contacted lab for blood draw.

## 2024-05-13 ENCOUNTER — Telehealth: Payer: Self-pay | Admitting: Oncology

## 2024-05-13 NOTE — Telephone Encounter (Signed)
 Patient called to reschedule her chemo from yesterday. She said she can come any day next week. Please advise reschedule.

## 2024-05-16 ENCOUNTER — Other Ambulatory Visit: Payer: Self-pay | Admitting: Oncology

## 2024-05-16 ENCOUNTER — Ambulatory Visit: Admitting: Physician Assistant

## 2024-05-16 DIAGNOSIS — C259 Malignant neoplasm of pancreas, unspecified: Secondary | ICD-10-CM

## 2024-05-19 ENCOUNTER — Inpatient Hospital Stay: Attending: Oncology

## 2024-05-19 ENCOUNTER — Ambulatory Visit

## 2024-05-19 ENCOUNTER — Inpatient Hospital Stay (HOSPITAL_BASED_OUTPATIENT_CLINIC_OR_DEPARTMENT_OTHER): Admitting: Oncology

## 2024-05-19 ENCOUNTER — Encounter: Payer: Self-pay | Admitting: Oncology

## 2024-05-19 VITALS — BP 169/93 | HR 56 | Temp 98.6°F | Resp 16 | Ht 61.0 in | Wt 230.0 lb

## 2024-05-19 VITALS — BP 147/81 | HR 63

## 2024-05-19 DIAGNOSIS — I2699 Other pulmonary embolism without acute cor pulmonale: Secondary | ICD-10-CM

## 2024-05-19 DIAGNOSIS — Z7901 Long term (current) use of anticoagulants: Secondary | ICD-10-CM | POA: Insufficient documentation

## 2024-05-19 DIAGNOSIS — C787 Secondary malignant neoplasm of liver and intrahepatic bile duct: Secondary | ICD-10-CM | POA: Diagnosis not present

## 2024-05-19 DIAGNOSIS — Z5111 Encounter for antineoplastic chemotherapy: Secondary | ICD-10-CM | POA: Diagnosis present

## 2024-05-19 DIAGNOSIS — C259 Malignant neoplasm of pancreas, unspecified: Secondary | ICD-10-CM | POA: Diagnosis not present

## 2024-05-19 DIAGNOSIS — R970 Elevated carcinoembryonic antigen [CEA]: Secondary | ICD-10-CM | POA: Diagnosis not present

## 2024-05-19 DIAGNOSIS — Z23 Encounter for immunization: Secondary | ICD-10-CM | POA: Insufficient documentation

## 2024-05-19 DIAGNOSIS — I4891 Unspecified atrial fibrillation: Secondary | ICD-10-CM

## 2024-05-19 DIAGNOSIS — Z803 Family history of malignant neoplasm of breast: Secondary | ICD-10-CM | POA: Insufficient documentation

## 2024-05-19 DIAGNOSIS — E876 Hypokalemia: Secondary | ICD-10-CM | POA: Diagnosis not present

## 2024-05-19 DIAGNOSIS — G620 Drug-induced polyneuropathy: Secondary | ICD-10-CM

## 2024-05-19 DIAGNOSIS — C252 Malignant neoplasm of tail of pancreas: Secondary | ICD-10-CM | POA: Diagnosis not present

## 2024-05-19 DIAGNOSIS — G893 Neoplasm related pain (acute) (chronic): Secondary | ICD-10-CM

## 2024-05-19 DIAGNOSIS — Z86711 Personal history of pulmonary embolism: Secondary | ICD-10-CM | POA: Diagnosis not present

## 2024-05-19 DIAGNOSIS — T451X5A Adverse effect of antineoplastic and immunosuppressive drugs, initial encounter: Secondary | ICD-10-CM

## 2024-05-19 DIAGNOSIS — I4892 Unspecified atrial flutter: Secondary | ICD-10-CM | POA: Diagnosis not present

## 2024-05-19 DIAGNOSIS — Z8041 Family history of malignant neoplasm of ovary: Secondary | ICD-10-CM | POA: Diagnosis not present

## 2024-05-19 LAB — CBC WITH DIFFERENTIAL (CANCER CENTER ONLY)
Abs Immature Granulocytes: 0.03 K/uL (ref 0.00–0.07)
Basophils Absolute: 0.1 K/uL (ref 0.0–0.1)
Basophils Relative: 1 %
Eosinophils Absolute: 0.3 K/uL (ref 0.0–0.5)
Eosinophils Relative: 4 %
HCT: 34.7 % — ABNORMAL LOW (ref 36.0–46.0)
Hemoglobin: 11.7 g/dL — ABNORMAL LOW (ref 12.0–15.0)
Immature Granulocytes: 1 %
Lymphocytes Relative: 24 %
Lymphs Abs: 1.6 K/uL (ref 0.7–4.0)
MCH: 31.5 pg (ref 26.0–34.0)
MCHC: 33.7 g/dL (ref 30.0–36.0)
MCV: 93.5 fL (ref 80.0–100.0)
Monocytes Absolute: 0.9 K/uL (ref 0.1–1.0)
Monocytes Relative: 13 %
Neutro Abs: 3.8 K/uL (ref 1.7–7.7)
Neutrophils Relative %: 57 %
Platelet Count: 288 K/uL (ref 150–400)
RBC: 3.71 MIL/uL — ABNORMAL LOW (ref 3.87–5.11)
RDW: 16.2 % — ABNORMAL HIGH (ref 11.5–15.5)
WBC Count: 6.5 K/uL (ref 4.0–10.5)
nRBC: 0 % (ref 0.0–0.2)

## 2024-05-19 LAB — CMP (CANCER CENTER ONLY)
ALT: 16 U/L (ref 0–44)
AST: 25 U/L (ref 15–41)
Albumin: 3.6 g/dL (ref 3.5–5.0)
Alkaline Phosphatase: 169 U/L — ABNORMAL HIGH (ref 38–126)
Anion gap: 10 (ref 5–15)
BUN: 13 mg/dL (ref 8–23)
CO2: 24 mmol/L (ref 22–32)
Calcium: 9 mg/dL (ref 8.9–10.3)
Chloride: 103 mmol/L (ref 98–111)
Creatinine: 0.79 mg/dL (ref 0.44–1.00)
GFR, Estimated: >60 mL/min (ref >60)
Glucose, Bld: 139 mg/dL — ABNORMAL HIGH (ref 70–99)
Potassium: 3.4 mmol/L — ABNORMAL LOW (ref 3.5–5.1)
Sodium: 137 mmol/L (ref 135–145)
Total Bilirubin: 1.1 mg/dL (ref 0.0–1.2)
Total Protein: 7.8 g/dL (ref 6.5–8.1)

## 2024-05-19 MED ORDER — SODIUM CHLORIDE 0.9 % IV SOLN
INTRAVENOUS | Status: DC
  Filled 2024-05-19: qty 250

## 2024-05-19 MED ORDER — POTASSIUM CHLORIDE CRYS ER 10 MEQ PO TBCR: 10.0000 meq | EXTENDED_RELEASE_TABLET | Freq: Every day | ORAL | 1 refills | Status: DC

## 2024-05-19 MED ORDER — SODIUM CHLORIDE 0.9 % IV SOLN
900.0000 mg/m2 | Freq: Once | INTRAVENOUS | Status: AC
  Administered 2024-05-19: 2090 mg via INTRAVENOUS
  Filled 2024-05-19: qty 54.97

## 2024-05-19 MED ORDER — INFLUENZA VAC SPLIT HIGH-DOSE 0.5 ML IM SUSY
0.5000 mL | PREFILLED_SYRINGE | Freq: Once | INTRAMUSCULAR | Status: AC
  Administered 2024-05-19: 0.5 mL via INTRAMUSCULAR
  Filled 2024-05-19: qty 0.5

## 2024-05-19 MED ORDER — APIXABAN 5 MG PO TABS: 5.0000 mg | ORAL_TABLET | Freq: Two times a day (BID) | ORAL | 1 refills | Status: DC

## 2024-05-19 MED ORDER — PROCHLORPERAZINE MALEATE 10 MG PO TABS
10.0000 mg | ORAL_TABLET | Freq: Once | ORAL | Status: AC
  Administered 2024-05-19: 10 mg via ORAL
  Filled 2024-05-19: qty 1

## 2024-05-19 NOTE — Assessment & Plan Note (Signed)
 stage IV pancreatic cancer with liver metastasis was discussed with patient.  Other differential diagnosis include GI tract adenocarcinoma, cholangiocarcinoma etc. Baseline CEA is elevated.  CA 19-9 is normal. CT and MRI imaging is did not show any GI primary.I recommend a PET scan for further evaluation- patient rescheduled appt initially and later on PET scan was not done due to admission. I offered to call her daughter to give her updates and patient declines.   Labs are reviewed and discussed with patient. Patient did not tolerate Gemciabine and Abraxane  well.  On dose reduced monotherapy Gemcitaibine 900mg /m2 1 week on 1 week off  Proceed with cycle 5 D1 gemcitabine    She previously no showed to CT scan which is rescheduled to 05/24/2024  She has established care with palliative care service.

## 2024-05-19 NOTE — Assessment & Plan Note (Signed)
 Atrial flutter: with RVR. S/p TEE & cardioverison on 12/28/23  follow-up with cardiology.  Continue metoprolol  and Eliquis 

## 2024-05-19 NOTE — Patient Instructions (Signed)
 [facility] - A DEPT OF [facility]  Discharge Instructions: Thank you for choosing [facility] to provide your oncology and hematology care.  If you have a lab appointment with the Cancer Center, please go directly to the Cancer Center and check in at the registration area.  Wear comfortable clothing and clothing appropriate for easy access to any Portacath or PICC line.   We strive to give you quality time with your provider. You may need to reschedule your appointment if you arrive late (15 or more minutes).  Arriving late affects you and other patients whose appointments are after yours.  Also, if you miss three or more appointments without notifying the office, you may be dismissed from the clinic at the provider's discretion.      For prescription refill requests, have your pharmacy contact our office and allow 72 hours for refills to be completed.    Today you received the following chemotherapy and/or immunotherapy agents Gemzar        To help prevent nausea and vomiting after your treatment, we encourage you to take your nausea medication as directed.  BELOW ARE SYMPTOMS THAT SHOULD BE REPORTED IMMEDIATELY: *FEVER GREATER THAN 100.4 F (38 C) OR HIGHER *CHILLS OR SWEATING *NAUSEA AND VOMITING THAT IS NOT CONTROLLED WITH YOUR NAUSEA MEDICATION *UNUSUAL SHORTNESS OF BREATH *UNUSUAL BRUISING OR BLEEDING *URINARY PROBLEMS (pain or burning when urinating, or frequent urination) *BOWEL PROBLEMS (unusual diarrhea, constipation, pain near the anus) TENDERNESS IN MOUTH AND THROAT WITH OR WITHOUT PRESENCE OF ULCERS (sore throat, sores in mouth, or a toothache) UNUSUAL RASH, SWELLING OR PAIN  UNUSUAL VAGINAL DISCHARGE OR ITCHING   Items with * indicate a potential emergency and should be followed up as soon as possible or go to the Emergency Department if any problems should occur.  Please show the CHEMOTHERAPY ALERT CARD or IMMUNOTHERAPY  ALERT CARD at check-in to the Emergency Department and triage nurse.  Should you have questions after your visit or need to cancel or reschedule your appointment, please contact [facility] - A DEPT OF [facility]  [phone] and follow the prompts.  Office hours are 8:00 a.m. to 4:30 p.m. Monday - Friday. Please note that voicemails left after 4:00 p.m. may not be returned until the following business day.  We are closed weekends and major holidays. You have access to a nurse at all times for urgent questions. Please call the main number to the clinic [phone] and follow the prompts.  For any non-urgent questions, you may also contact your provider using MyChart. We now offer e-Visits for anyone 24 and older to request care online for non-urgent symptoms. For details visit [email or web address].   Also download the MyChart app! Go to the app store, search MyChart, open the app, select [facility], and log in with your MyChart username and password.    Hypokalemia Hypokalemia means that the amount of potassium in the blood is lower than normal. Potassium is a mineral (electrolyte) that helps regulate the amount of fluid in the body. It also stimulates muscle tightening (contraction) and helps nerves work properly. Normally, most of the body's potassium is inside cells, and only a very small amount is in the blood. Because the amount in the blood is so small, minor changes to potassium levels in the blood can be life-threatening. What are the causes? This condition may be caused by: Antibiotic medicine. Diarrhea or vomiting.  Taking too much of a medicine that helps you have a bowel movement (laxative) can cause diarrhea and lead to hypokalemia. Chronic kidney disease (CKD). Medicines that help the body get rid of excess fluid (diuretics). Eating disorders, such as anorexia or bulimia. Low magnesium  levels in the body. Sweating a lot. What are the signs  or symptoms? Symptoms of this condition include: Weakness. Constipation. Fatigue. Muscle cramps. Mental confusion. Skipped heartbeats or irregular heartbeat (palpitations). Tingling or numbness. How is this diagnosed? This condition is diagnosed with a blood test. How is this treated? This condition may be treated by: Taking potassium supplements. Adjusting the medicines that you take. Eating more foods that contain a lot of potassium. If your potassium level is very low, you may need to get potassium through an IV and be monitored in the hospital. Follow these instructions at home: Eating and drinking  Eat a healthy diet. A healthy diet includes fresh fruits and vegetables, whole grains, healthy fats, and lean proteins. If told, eat more foods that contain a lot of potassium. These include: Nuts, such as peanuts and pistachios. Seeds, such as sunflower seeds and pumpkin seeds. Peas, lentils, and lima beans. Whole grain and bran cereals and breads. Fresh fruits and vegetables, such as apricots, avocado, bananas, cantaloupe, kiwi, oranges, tomatoes, asparagus, and potatoes. Juices, such as orange, tomato, and prune. Lean meats, including fish. Milk and milk products, such as yogurt. General instructions Take over-the-counter and prescription medicines only as told by your health care provider. This includes vitamins, natural food products, and supplements. Keep all follow-up visits. This is important. Contact a health care provider if: You have weakness that gets worse. You feel your heart pounding or racing. You vomit. You have diarrhea. You have diabetes and you have trouble keeping your blood sugar in your target range. Get help right away if: You have chest pain. You have shortness of breath. You have vomiting or diarrhea that lasts for more than 2 days. You faint. These symptoms may be an emergency. Get help right away. Call 911. Do not wait to see if the symptoms  will go away. Do not drive yourself to the hospital. Summary Hypokalemia means that the amount of potassium in the blood is lower than normal. This condition is diagnosed with a blood test. Hypokalemia may be treated by taking potassium supplements, adjusting the medicines that you take, or eating more foods that are high in potassium. If your potassium level is very low, you may need to get potassium through an IV and be monitored in the hospital. This information is not intended to replace advice given to you by your health care provider. Make sure you discuss any questions you have with your health care provider. Document Revised: 04/18/2021 Document Reviewed: 04/18/2021 Elsevier Patient Education  2024 [employer].

## 2024-05-19 NOTE — Assessment & Plan Note (Signed)
 Recommend gabapentin  100mg  TID PRN.

## 2024-05-19 NOTE — Assessment & Plan Note (Signed)
 continue OxyContin  10 mg every 12 hours oxycodone  5 mg every 4-6 hours as needed -  .

## 2024-05-19 NOTE — Assessment & Plan Note (Signed)
 Continue Eliquis 5mg  BID

## 2024-05-19 NOTE — Progress Notes (Signed)
 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  ASSESSMENT & PLAN:   Malignant neoplasm of pancreas (HCC) stage IV pancreatic cancer with liver metastasis was discussed with patient.  Other differential diagnosis include GI tract adenocarcinoma, cholangiocarcinoma etc. Baseline CEA is elevated.  CA 19-9 is normal. CT and MRI imaging is did not show any GI primary.I recommend a PET scan for further evaluation- patient rescheduled appt initially and later on PET scan was not done due to admission. I offered to call her daughter to give her updates and patient declines.   Labs are reviewed and discussed with patient. Patient did not tolerate Gemciabine and Abraxane  well.  On dose reduced monotherapy Gemcitaibine 900mg /m2 1 week on 1 week off  Proceed with cycle 5 D1 gemcitabine    She previously no showed to CT scan which is rescheduled to 05/24/2024  She has established care with palliative care service.     Atrial fibrillation with RVR (HCC) Atrial flutter: with RVR. S/p TEE & cardioverison on 12/28/23  follow-up with cardiology.  Continue metoprolol  and Eliquis     Pulmonary embolism (HCC) Continue Eliquis  5mg  BID  Chemotherapy-induced neuropathy Recommend gabapentin  100mg  TID PRN.    Encounter for antineoplastic chemotherapy Chemotherapy plan as listed above  Neoplasm related pain continue OxyContin  10 mg every 12 hours oxycodone  5 mg every 4-6 hours as needed -  .     Orders Placed This Encounter  Procedures   CT ABDOMEN PELVIS W CONTRAST    Standing Status:   Future    Expected Date:   05/24/2024    Expiration Date:   05/19/2025    If indicated for the ordered procedure, I authorize the administration of contrast media per Radiology protocol:   Yes    Does the patient have a contrast media/X-ray dye allergy?:   No    Preferred imaging  location?:   [facility]    If indicated for the ordered procedure, I authorize the administration of oral contrast media per Radiology protocol:   Yes   Cancer antigen 19-9    Standing Status:   Future    Expected Date:   06/02/2024    Expiration Date:   06/02/2025   CEA    Standing Status:   Future    Expected Date:   06/02/2024    Expiration Date:   06/02/2025   CBC with Differential (Cancer Center Only)    Standing Status:   Future    Expected Date:   06/02/2024    Expiration Date:   06/02/2025   CMP (Cancer Center only)    Standing Status:   Future    Expected Date:   06/02/2024    Expiration Date:   06/02/2025   CBC with Differential (Cancer Center Only)    Standing Status:   Future    Expected Date:   06/16/2024    Expiration Date:   06/16/2025   CMP (Cancer Center only)    Standing Status:   Future    Expected Date:   06/16/2024    Expiration Date:   06/16/2025   Follow-up 2 weeks All questions were answered. The patient knows to call the clinic with any problems, questions or concerns.  [name], MD, PhD [facility] Hematology Oncology 05/19/2024    HISTORY OF PRESENTING ILLNESS:  Kristina Cruz 63 y.o. female presents to establish care for Stage IV pancreatic cancer I have reviewed  her chart and materials related to her cancer extensively and collaborated history with the patient. Summary of oncologic history is as follows: Oncology History  Malignant neoplasm of pancreas (HCC)  08/21/2023 Imaging   CT abdomen pelvis with contrast showed 1. Masslike thickening involving the proximal and mid tail of pancreas with relative hypoenhancement measures approximately 3.9 x 5.5 cm. In the absence of signs/symptoms of acute pancreatitis underlying neoplastic process cannot be excluded. Further evaluation with nonemergent contrast enhanced MRI of the abdomen is advised. Note: Given patient's body habitus and the motion artifact observed on the current exam an MRI  obtained at this time is likely to be severely limited and likely nondiagnostic. MRI should be obtained only once the patient is clinically stable, and is able to remain motionless and breath hold. 2. Patchy ground-glass and airspace densities identified within both lower lobes. These are new compared with the previous exam and are concerning for underlying inflammatory/infectious process. 3. Sigmoid diverticulosis without signs of acute diverticulitis. 4. Marked diastasis recti with ventral herniation of the large and small bowel loops. 5. Periumbilical hernia is containing fat only.   08/24/2023 Imaging   MRI abdomen wo contrast  1. Moderately suboptimal unenhanced exam. 2. Heterogeneous masslike thickening of the pancreatic body/tail again seen. The lesion remains indeterminate on this exam. Differential diagnosis includes pancreatic tumor, autoimmune pancreatitis, mass forming chronic pancreatitis, etc. Please see above for follow-up recommendations. 3. Mild diffuse hepatic steatosis. There are indeterminate areas in the liver, which can also be better evaluated on the contrast-enhanced MRI abdomen. 4. Other observations, as described above.   11/05/2023 Initial Diagnosis   Pancreatic cancer metastasized to liver  Patient was hospitalized from 11/05/2023 - 11/10/2023 She presented to emergency room due to nausea, vomiting and abdominal pain. CT scan showed multiple hypodense hepatic lesions increasing number and size from prior imaging.  Pancreatic tail mass, hypodense lesion in the anterior spleen.  11/09/2023 ultrasound-guided biopsy was obtained. Pathology showed moderately to poorly differentiated adenocarcinoma with extensive areas of tumor necrosis. The adenocarcinoma is positive for cytokeratin 7 and CDX2 (focal).     Cytokeratin 20, TTF-1, and GATA3 are negative. The pattern of immunoreactivity is not specific and the differential diagnosis includes metastatic adenocarcinoma  from the pancreaticobiliary tract and upper GI. Intrahepatic       cholangiocarcinoma is also a diagnostic possibility. Given the radiographic  findings a pancreaticobiliary primary is favored and correlation with clinical impression is required.    11/17/2023 Cancer Staging   Staging form: Exocrine Pancreas, AJCC 8th Edition - Clinical stage from 11/17/2023: Stage IV (cT3, cNX, pM1) - Signed by Babara Call, MD on 11/17/2023 Stage prefix: Initial diagnosis    Imaging   CT abdomen pelvis w contrast  1. Multiple hypodense hepatic lesions, increased in number and size from prior imaging, highly suspicious for metastatic disease. 2. Hypodense lesion in the pancreatic tail measures 7.2 x 3.9 cm, increased in size from prior. The increase in size favors pancreatic neoplasm, with additional etiologies less likely. 3. New hypodense lesion in the anterior spleen, 13 mm, suspicious for metastatic disease. 4. Laxity of the anterior abdominal wall with midline ventral abdominal wall hernia containing small and to a lesser extent large bowel. No bowel obstruction or inflammation. 5. Colonic diverticulosis without diverticulitis.   Aortic Atherosclerosis (ICD10-I70.0)    11/27/2023 -  Chemotherapy   Patient is on Treatment Plan : Gemcitabine  D1,15 q28d      Hospitalization due to Atrial flutter: with RVR. S/p TEE &  cardioverison on 12/28/23, severe sepsis, gastroenteritis.  01/30/2024 - 01/31/2024 patient was hospitalized due to CHF exacerbation, she also received antibiotics for E. coli UTI.  Today she reports feeling well. ER visit for evaluation of chest pain. CTA chest showed segmental and subsegmental PE of right lung. She was on Eliquis  for A- Fib and stopped after running out.  Resumed on Eliquis  5mg  BID.  No new complaints today.     MEDICAL HISTORY:  Past Medical History:  Diagnosis Date   Anemia    Asthma    Back pain    Cataract    Diabetes mellitus without complication (HCC)    GERD  (gastroesophageal reflux disease)    History of echocardiogram    Hypertension    Morbid obesity with BMI of 60.0-69.9, adult (HCC)    Restless leg syndrome     SURGICAL HISTORY: Past Surgical History:  Procedure Laterality Date   ABDOMINAL HYSTERECTOMY     CARDIOVERSION N/A 12/28/2023   Procedure: CARDIOVERSION;  Surgeon: Darliss Rogue, MD;  Location: [facility] ORS;  Service: Cardiovascular;  Laterality: N/A;   CATARACT EXTRACTION W/PHACO Left 04/13/2020   Procedure: CATARACT EXTRACTION PHACO AND INTRAOCULAR LENS PLACEMENT (IOC);  Surgeon: Ferol Rogue, MD;  Location: [facility] ORS;  Service: Ophthalmology;  Laterality: Left;  US  00:36.0 CDE 5.95 Fluid Pack lot # [id number]   HYSTEROSCOPY WITH D & C N/A 07/14/2017   Procedure: DILATATION AND CURETTAGE /HYSTEROSCOPY;  Surgeon: Arloa Lamar SQUIBB, MD;  Location: [facility] ORS;  Service: Gynecology;  Laterality: N/A;   IR IMAGING GUIDED PORT INSERTION  12/17/2023   POLYPECTOMY  2015   TEE WITHOUT CARDIOVERSION N/A 12/28/2023   Procedure: ECHOCARDIOGRAM, TRANSESOPHAGEAL;  Surgeon: Darliss Rogue, MD;  Location: [facility] ORS;  Service: Cardiovascular;  Laterality: N/A;    SOCIAL HISTORY: Social History   Socioeconomic History   Marital status: Single    Spouse name: Not on file   Number of children: Not on file   Years of education: Not on file   Highest education level: Not on file  Occupational History   Not on file  Tobacco Use   Smoking status: Never   Smokeless tobacco: Never  Vaping Use   Vaping status: Never Used  Substance and Sexual Activity   Alcohol use: No   Drug use: No   Sexual activity: Never    Birth control/protection: None  Other Topics Concern   Not on file  Social History Narrative   Not on file   Social Drivers of Health   Financial Resource Strain: Low Risk  (05/11/2024)   Received from [facility]   Overall Financial Resource Strain (CARDIA)    Difficulty of Paying Living Expenses: Not hard at  all  Food Insecurity: No Food Insecurity (05/11/2024)   Received from [facility]   Hunger Vital Sign    Within the past 12 months, you worried that your food would run out before you got the money to buy more.: Never true    Within the past 12 months, the food you bought just didn't last and you didn't have money to get more.: Never true  Transportation Needs: No Transportation Needs (05/11/2024)   Received from [facility] - Transportation    In the past 12 months, has lack of transportation kept you from medical appointments or from getting medications?: No    Lack of Transportation (Non-Medical): No  Physical Activity: Not on file  Stress: Not on file  Social Connections: Socially Isolated (12/25/2023)   Social Connection and Isolation Panel    Frequency of Communication with Friends and Family: More than three times a week    Frequency of Social Gatherings with Friends and Family: More than three times a week    Attends Religious Services: Never    Database administrator or Organizations: No    Attends Banker Meetings: Never    Marital Status: Never married  Intimate Partner Violence: Patient Declined (01/30/2024)   Humiliation, Afraid, Rape, and Kick questionnaire    Fear of Current or Ex-Partner: Patient declined    Emotionally Abused: Patient declined    Physically Abused: Patient declined    Sexually Abused: Patient declined    FAMILY HISTORY: Family History  Problem Relation Age of Onset   Breast cancer Mother 90   Diabetes Mother    Hypertension Mother    Ovarian cancer Paternal Aunt        ?   Diabetes Father    Hypertension Father     ALLERGIES:  is allergic to peanut-containing drug products.  MEDICATIONS:  Current Outpatient Medications  Medication Sig Dispense Refill   acetaminophen  (TYLENOL  8 HOUR ARTHRITIS PAIN) 650 MG CR tablet Take 3 tablets (1,950 mg total) by mouth every 8 (eight) hours as  needed for pain. Do not exceed 4000 mg total in a day.  Home med.     azelastine  (ASTELIN ) 0.1 % nasal spray Place 1 spray into both nostrils 2 (two) times daily.     baclofen  (LIORESAL ) 10 MG tablet Take 10 mg by mouth 3 (three) times daily.     diphenoxylate -atropine  (LOMOTIL ) 2.5-0.025 MG tablet Take 2 tablets by mouth every 6 (six) hours as needed (for diarrhea unrelieved by imodium). 90 tablet 0   EPINEPHrine  0.3 mg/0.3 mL IJ SOAJ injection Inject 0.3 mg into the muscle as needed for anaphylaxis. 1 each 0   fluticasone  (FLONASE ) 50 MCG/ACT nasal spray Place 1 spray into both nostrils daily. 9.9 mL 0   fluticasone  furoate-vilanterol (BREO ELLIPTA ) 100-25 MCG/ACT AEPB Inhale 1 puff into the lungs daily. 28 each 1   furosemide  (LASIX ) 40 MG tablet Take 40 mg by mouth daily.     gabapentin  (NEURONTIN ) 300 MG capsule Take 1 capsule (300 mg total) by mouth 2 (two) times daily. 60 capsule 0   ipratropium-albuterol  (DUONEB) 0.5-2.5 (3) MG/3ML SOLN Take 3 mLs by nebulization every 4 (four) hours as needed (wheezing / shortness of breath). 360 mL 1   levocetirizine (XYZAL ) 5 MG tablet Take 5 mg by mouth daily.     lidocaine -prilocaine  (EMLA ) cream Apply 1 Application topically as needed. Apply to port and cover with saran wrap 1-2 hours prior to port access 30 g 1   magic mouthwash (lidocaine , diphenhydrAMINE , alum & mag hydroxide) suspension Swish and swallow 5 mLs 4 (four) times daily as needed for mouth pain. 360 mL 1   metFORMIN  (GLUCOPHAGE ) 1000 MG tablet Take 1,000 mg by mouth 2 (two) times daily.     metoprolol  tartrate (LOPRESSOR ) 25 MG tablet Take 1 tablet (25 mg total) by mouth 2 (two) times daily. 60 tablet 0   morphine  (MS CONTIN ) 15 MG 12 hr tablet Take 1 tablet (15 mg total) by mouth every 12 (twelve) hours. 60 tablet 0   naloxone  (NARCAN ) nasal spray 4 mg/0.1 mL SPRAY 1 SPRAY INTO ONE NOSTRIL AS DIRECTED FOR OPIOID OVERDOSE (TURN PERSON ON SIDE AFTER DOSE. IF NO RESPONSE IN 2-3 MINUTES OR  PERSON RESPONDS BUT RELAPSES, REPEAT USING A NEW SPRAY DEVICE AND SPRAY INTO THE OTHER NOSTRIL. CALL 911 AFTER USE.) * EMERGENCY USE ONLY * 1 each 0   ondansetron  (ZOFRAN -ODT) 4 MG disintegrating tablet Take 1 tablet (4 mg total) by mouth every 8 (eight) hours as needed for nausea or vomiting. 45 tablet 0   oxyCODONE  (OXY IR/ROXICODONE ) 5 MG immediate release tablet Take 1 tablet (5 mg total) by mouth every 4 (four) hours as needed for moderate pain (pain score 4-6) or severe pain (pain score 7-10). 45 tablet 0   potassium chloride  (KLOR-CON  M) 10 MEQ tablet Take 1 tablet (10 mEq total) by mouth daily. 30 tablet 1   prochlorperazine  (COMPAZINE ) 10 MG tablet Take 1 tablet (10 mg total) by mouth every 6 (six) hours as needed for nausea or vomiting. 30 tablet 1   rOPINIRole  (REQUIP ) 2 MG tablet Take 2 mg by mouth in the morning and at bedtime.      spironolactone  (ALDACTONE ) 50 MG tablet Take 1 tablet (50 mg total) by mouth daily. 30 tablet 1   traZODone  (DESYREL ) 50 MG tablet Take 0.5-1 tablets (25-50 mg total) by mouth at bedtime as needed for sleep. 30 tablet 2   VENTOLIN  HFA 108 (90 Base) MCG/ACT inhaler Inhale 2 puffs into the lungs every 4 (four) hours as needed for wheezing or shortness of breath. 18 g 1   apixaban  (ELIQUIS ) 5 MG TABS tablet Take 1 tablet (5 mg total) by mouth 2 (two) times daily. 60 tablet 1   No current facility-administered medications for this visit.   Facility-Administered Medications Ordered in Other Visits  Medication Dose Route Frequency Provider Last Rate Last Admin   0.9 %  sodium chloride  infusion   Intravenous Continuous Babara Call, MD   Stopped at 05/19/24 1109    Review of Systems  Constitutional:  Positive for fatigue. Negative for appetite change, chills and fever.  HENT:   Negative for hearing loss and voice change.   Eyes:  Negative for eye problems.  Respiratory:  Negative for chest tightness, cough and shortness of breath.   Cardiovascular:  Positive for  leg swelling. Negative for chest pain.  Gastrointestinal:  Positive for nausea. Negative for abdominal distention, abdominal pain and blood in stool.  Endocrine: Negative for hot flashes.  Genitourinary:  Negative for difficulty urinating and frequency.   Musculoskeletal:  Negative for arthralgias.  Skin:  Negative for itching and rash.  Neurological:  Negative for extremity weakness.  Hematological:  Negative for adenopathy.  Psychiatric/Behavioral:  Negative for confusion.      PHYSICAL EXAMINATION: ECOG PERFORMANCE STATUS: 1 - Symptomatic but completely ambulatory  Vitals:   05/19/24 0843 05/19/24 0848  BP: (!) 164/78 (!) 169/93  Pulse: (!) 56   Resp: 16   Temp: 98.6 F (37 C)   SpO2: 98%    Filed Weights   05/19/24 0843  Weight: 230 lb (104.3 kg)     Physical Exam Constitutional:      General: She is not in acute distress.    Appearance: She is obese. She is not diaphoretic.  HENT:     Head: Normocephalic and atraumatic.  Eyes:     General: No scleral icterus. Cardiovascular:     Rate and Rhythm: Normal rate and regular rhythm.  Pulmonary:     Effort: Pulmonary effort is normal. No respiratory distress.     Breath sounds: No wheezing or rales.     Comments: Decreased breath sound bilaterally Abdominal:  General: Bowel sounds are normal. There is no distension.     Palpations: Abdomen is soft.  Musculoskeletal:        General: Swelling present. Normal range of motion.     Cervical back: Normal range of motion and neck supple.     Right lower leg: Edema present.     Left lower leg: Edema present.  Skin:    General: Skin is warm and dry.     Findings: No erythema.  Neurological:     Mental Status: She is alert and oriented to person, place, and time. Mental status is at baseline.     Motor: No abnormal muscle tone.  Psychiatric:        Mood and Affect: Mood and affect normal.      LABORATORY DATA:  I have reviewed the data as listed    Latest Ref  Rng & Units 05/19/2024    8:26 AM 05/12/2024    9:23 AM 04/28/2024    8:53 AM  CBC  WBC 4.0 - 10.5 K/uL 6.5  5.8  5.9   Hemoglobin 12.0 - 15.0 g/dL 88.2  88.6  88.1   Hematocrit 36.0 - 46.0 % 34.7  34.2  35.3   Platelets 150 - 400 K/uL 288  233  251       Latest Ref Rng & Units 05/19/2024    8:26 AM 05/12/2024    9:23 AM 04/28/2024    8:53 AM  CMP  Glucose 70 - 99 mg/dL 860  820  810   BUN 8 - 23 mg/dL 13  12  20    Creatinine 0.44 - 1.00 mg/dL 9.20  9.08  9.00   Sodium 135 - 145 mmol/L 137  136  135   Potassium 3.5 - 5.1 mmol/L 3.4  3.5  3.4   Chloride 98 - 111 mmol/L 103  103  98   CO2 22 - 32 mmol/L 24  25  27    Calcium  8.9 - 10.3 mg/dL 9.0  9.0  9.2   Total Protein 6.5 - 8.1 g/dL 7.8  7.9  7.9   Total Bilirubin 0.0 - 1.2 mg/dL 1.1  0.9  0.8   Alkaline Phos 38 - 126 U/L 169  156  153   AST 15 - 41 U/L 25  23  20    ALT 0 - 44 U/L 16  17  17       RADIOGRAPHIC STUDIES: I have personally reviewed the radiological images as listed and agreed with the findings in the report. CT Angio Chest PE W and/or Wo Contrast Result Date: 05/12/2024 CLINICAL DATA:  Chest pain in a pancreatic cancer patient. Chest pain for 2 weeks. Nausea. EXAM: CT ANGIOGRAPHY CHEST WITH CONTRAST TECHNIQUE: Multidetector CT imaging of the chest was performed using the standard protocol during bolus administration of intravenous contrast. Multiplanar CT image reconstructions and MIPs were obtained to evaluate the vascular anatomy. RADIATION DOSE REDUCTION: This exam was performed according to the departmental dose-optimization program which includes automated exposure control, adjustment of the mA and/or kV according to patient size and/or use of iterative reconstruction technique. CONTRAST:  75mL OMNIPAQUE  IOHEXOL  350 MG/ML SOLN COMPARISON:  Chest radiograph 05/12/2024.  CT chest 12/25/2023 FINDINGS: Cardiovascular: There is good opacification of the central and segmental pulmonary arteries. Focal filling defects  demonstrated in anterior right upper lung and posterior right lower lung segmental and subsegmental branches consistent with acute pulmonary embolus. Clot burden is low. Heart size is normal with RV to LV ratio  measuring 0.8 suggesting unlikely right heart strain. Normal caliber thoracic aorta. No aortic dissection. Great vessel origins are patent. Mediastinum/Nodes: Right central venous catheter with tip in the right atrium. Esophagus is decompressed. Thyroid  gland is unremarkable. No significant lymphadenopathy. Lungs/Pleura: Linear scarring in the lung bases. No airspace disease or consolidation in the lungs. No pleural effusion or pneumothorax. Upper Abdomen: Numerous low-attenuation lesions throughout the liver consistent with hepatic metastases. These are better visualized on previous study due to motion artifact today. Enlarged body of the pancreas is also poorly defined but likely corresponds to known pancreatic cancer. Upper abdominal varices are present. Musculoskeletal: Degenerative changes throughout the spine. No focal bone lesions. Review of the MIP images confirms the above findings. IMPRESSION: 1. Positive examination for pulmonary embolus with segmental and subsegmental emboli demonstrated in the right lung. No evidence of right heart strain. 2. No evidence of active pulmonary disease. 3. Incidental note of multiple hepatic metastasis and pancreatic mass in the upper abdomen corresponding to known cancer and metastatic disease. Critical Value/emergent results were called by telephone at the time of interpretation on 05/12/2024 at 3:38 pm to provider Dr. Jossie , who verbally acknowledged these results. Electronically Signed   By: Elsie Gravely M.D.   On: 05/12/2024 15:48   DG Chest 2 View Result Date: 05/12/2024 EXAM: 2 VIEW(S) XRAY OF THE CHEST 05/12/2024 10:55:00 AM COMPARISON: 01/30/2024 CLINICAL HISTORY: cp. Pt to ED from cancer center for chest pain x2 weeks. +nausea. Pt had labs drawn at  cancer center this am. FINDINGS: LINES, TUBES AND DEVICES: Stable right IJ Port-A-Cath in place. LUNGS AND PLEURA: No focal pulmonary opacity. No pulmonary edema. No pleural effusion. No pneumothorax. HEART AND MEDIASTINUM: Cardiomegaly, unchanged. No acute abnormality of the mediastinal silhouette. BONES AND SOFT TISSUES: Thoracic spondylosis. No acute osseous abnormality. IMPRESSION: 1. No acute cardiopulmonary abnormality. 2. Cardiomegaly, unchanged. Electronically signed by: Waddell Calk MD 05/12/2024 12:03 PM EDT RP Workstation: [id number]

## 2024-05-19 NOTE — Assessment & Plan Note (Signed)
 Chemotherapy plan as listed above

## 2024-05-24 ENCOUNTER — Ambulatory Visit

## 2024-06-01 ENCOUNTER — Ambulatory Visit

## 2024-06-02 ENCOUNTER — Inpatient Hospital Stay

## 2024-06-02 ENCOUNTER — Inpatient Hospital Stay (HOSPITAL_BASED_OUTPATIENT_CLINIC_OR_DEPARTMENT_OTHER): Admitting: Hospice and Palliative Medicine

## 2024-06-02 ENCOUNTER — Encounter: Payer: Self-pay | Admitting: Oncology

## 2024-06-02 ENCOUNTER — Inpatient Hospital Stay (HOSPITAL_BASED_OUTPATIENT_CLINIC_OR_DEPARTMENT_OTHER): Admitting: Oncology

## 2024-06-02 VITALS — BP 136/72 | HR 64 | Temp 97.0°F | Resp 18

## 2024-06-02 VITALS — BP 124/66 | HR 61 | Resp 18

## 2024-06-02 DIAGNOSIS — G893 Neoplasm related pain (acute) (chronic): Secondary | ICD-10-CM

## 2024-06-02 DIAGNOSIS — C259 Malignant neoplasm of pancreas, unspecified: Secondary | ICD-10-CM

## 2024-06-02 DIAGNOSIS — I4891 Unspecified atrial fibrillation: Secondary | ICD-10-CM

## 2024-06-02 DIAGNOSIS — R970 Elevated carcinoembryonic antigen [CEA]: Secondary | ICD-10-CM

## 2024-06-02 DIAGNOSIS — Z515 Encounter for palliative care: Secondary | ICD-10-CM

## 2024-06-02 DIAGNOSIS — R52 Pain, unspecified: Secondary | ICD-10-CM

## 2024-06-02 DIAGNOSIS — G620 Drug-induced polyneuropathy: Secondary | ICD-10-CM

## 2024-06-02 DIAGNOSIS — Z5111 Encounter for antineoplastic chemotherapy: Secondary | ICD-10-CM | POA: Diagnosis not present

## 2024-06-02 DIAGNOSIS — C787 Secondary malignant neoplasm of liver and intrahepatic bile duct: Secondary | ICD-10-CM

## 2024-06-02 DIAGNOSIS — I2699 Other pulmonary embolism without acute cor pulmonale: Secondary | ICD-10-CM

## 2024-06-02 DIAGNOSIS — Z7901 Long term (current) use of anticoagulants: Secondary | ICD-10-CM

## 2024-06-02 DIAGNOSIS — T451X5A Adverse effect of antineoplastic and immunosuppressive drugs, initial encounter: Secondary | ICD-10-CM

## 2024-06-02 LAB — CMP (CANCER CENTER ONLY)
ALT: 18 U/L (ref 0–44)
AST: 24 U/L (ref 15–41)
Albumin: 3.4 g/dL — ABNORMAL LOW (ref 3.5–5.0)
Alkaline Phosphatase: 190 U/L — ABNORMAL HIGH (ref 38–126)
Anion gap: 10 (ref 5–15)
BUN: 15 mg/dL (ref 8–23)
CO2: 25 mmol/L (ref 22–32)
Calcium: 8.9 mg/dL (ref 8.9–10.3)
Chloride: 103 mmol/L (ref 98–111)
Creatinine: 0.95 mg/dL (ref 0.44–1.00)
GFR, Estimated: >60 mL/min (ref >60)
Glucose, Bld: 158 mg/dL — ABNORMAL HIGH (ref 70–99)
Potassium: 3.5 mmol/L (ref 3.5–5.1)
Sodium: 138 mmol/L (ref 135–145)
Total Bilirubin: 1.2 mg/dL (ref 0.0–1.2)
Total Protein: 8 g/dL (ref 6.5–8.1)

## 2024-06-02 LAB — CBC WITH DIFFERENTIAL (CANCER CENTER ONLY)
Abs Immature Granulocytes: 0.04 K/uL (ref 0.00–0.07)
Basophils Absolute: 0.1 K/uL (ref 0.0–0.1)
Basophils Relative: 1 %
Eosinophils Absolute: 0.3 K/uL (ref 0.0–0.5)
Eosinophils Relative: 4 %
HCT: 34.7 % — ABNORMAL LOW (ref 36.0–46.0)
Hemoglobin: 11.7 g/dL — ABNORMAL LOW (ref 12.0–15.0)
Immature Granulocytes: 1 %
Lymphocytes Relative: 19 %
Lymphs Abs: 1.5 K/uL (ref 0.7–4.0)
MCH: 31.6 pg (ref 26.0–34.0)
MCHC: 33.7 g/dL (ref 30.0–36.0)
MCV: 93.8 fL (ref 80.0–100.0)
Monocytes Absolute: 0.8 K/uL (ref 0.1–1.0)
Monocytes Relative: 10 %
Neutro Abs: 5.1 K/uL (ref 1.7–7.7)
Neutrophils Relative %: 65 %
Platelet Count: 223 K/uL (ref 150–400)
RBC: 3.7 MIL/uL — ABNORMAL LOW (ref 3.87–5.11)
RDW: 16 % — ABNORMAL HIGH (ref 11.5–15.5)
WBC Count: 7.9 K/uL (ref 4.0–10.5)
nRBC: 0 % (ref 0.0–0.2)

## 2024-06-02 MED ORDER — OXYCODONE HCL 5 MG PO TABS: 5.0000 mg | ORAL_TABLET | ORAL | 0 refills | Status: DC | PRN

## 2024-06-02 MED ORDER — SODIUM CHLORIDE 0.9 % IV SOLN
INTRAVENOUS | Status: DC
  Filled 2024-06-02: qty 250

## 2024-06-02 MED ORDER — PROCHLORPERAZINE MALEATE 10 MG PO TABS
10.0000 mg | ORAL_TABLET | Freq: Once | ORAL | Status: AC
  Administered 2024-06-02: 10 mg via ORAL
  Filled 2024-06-02: qty 1

## 2024-06-02 MED ORDER — SODIUM CHLORIDE 0.9 % IV SOLN
900.0000 mg/m2 | Freq: Once | INTRAVENOUS | Status: AC
  Administered 2024-06-02: 2090 mg via INTRAVENOUS
  Filled 2024-06-02: qty 54.97

## 2024-06-02 NOTE — Assessment & Plan Note (Signed)
 She is not taking MS contin  oxycodone  5 mg every 4-6 hours as needed -  .

## 2024-06-02 NOTE — Assessment & Plan Note (Signed)
 Atrial flutter: with RVR. S/p TEE & cardioverison on 12/28/23  follow-up with cardiology.  Continue metoprolol  and Eliquis 

## 2024-06-02 NOTE — Patient Instructions (Signed)
 [facility] - A DEPT OF [facility]  Discharge Instructions: Thank you for choosing [facility] to provide your oncology and hematology care.  If you have a lab appointment with the Cancer Center, please go directly to the Cancer Center and check in at the registration area.  Wear comfortable clothing and clothing appropriate for easy access to any Portacath or PICC line.   We strive to give you quality time with your provider. You may need to reschedule your appointment if you arrive late (15 or more minutes).  Arriving late affects you and other patients whose appointments are after yours.  Also, if you miss three or more appointments without notifying the office, you may be dismissed from the clinic at the provider's discretion.      For prescription refill requests, have your pharmacy contact our office and allow 72 hours for refills to be completed.    Today you received the following chemotherapy and/or immunotherapy agents Gemzar        To help prevent nausea and vomiting after your treatment, we encourage you to take your nausea medication as directed.  BELOW ARE SYMPTOMS THAT SHOULD BE REPORTED IMMEDIATELY: *FEVER GREATER THAN 100.4 F (38 C) OR HIGHER *CHILLS OR SWEATING *NAUSEA AND VOMITING THAT IS NOT CONTROLLED WITH YOUR NAUSEA MEDICATION *UNUSUAL SHORTNESS OF BREATH *UNUSUAL BRUISING OR BLEEDING *URINARY PROBLEMS (pain or burning when urinating, or frequent urination) *BOWEL PROBLEMS (unusual diarrhea, constipation, pain near the anus) TENDERNESS IN MOUTH AND THROAT WITH OR WITHOUT PRESENCE OF ULCERS (sore throat, sores in mouth, or a toothache) UNUSUAL RASH, SWELLING OR PAIN  UNUSUAL VAGINAL DISCHARGE OR ITCHING   Items with * indicate a potential emergency and should be followed up as soon as possible or go to the Emergency Department if any problems should occur.  Please show the CHEMOTHERAPY ALERT CARD or IMMUNOTHERAPY  ALERT CARD at check-in to the Emergency Department and triage nurse.  Should you have questions after your visit or need to cancel or reschedule your appointment, please contact [facility] - A DEPT OF [facility]  [phone] and follow the prompts.  Office hours are 8:00 a.m. to 4:30 p.m. Monday - Friday. Please note that voicemails left after 4:00 p.m. may not be returned until the following business day.  We are closed weekends and major holidays. You have access to a nurse at all times for urgent questions. Please call the main number to the clinic [phone] and follow the prompts.  For any non-urgent questions, you may also contact your provider using MyChart. We now offer e-Visits for anyone 24 and older to request care online for non-urgent symptoms. For details visit [email or web address].   Also download the MyChart app! Go to the app store, search MyChart, open the app, select [facility], and log in with your MyChart username and password.    Hypokalemia Hypokalemia means that the amount of potassium in the blood is lower than normal. Potassium is a mineral (electrolyte) that helps regulate the amount of fluid in the body. It also stimulates muscle tightening (contraction) and helps nerves work properly. Normally, most of the body's potassium is inside cells, and only a very small amount is in the blood. Because the amount in the blood is so small, minor changes to potassium levels in the blood can be life-threatening. What are the causes? This condition may be caused by: Antibiotic medicine. Diarrhea or vomiting.  Taking too much of a medicine that helps you have a bowel movement (laxative) can cause diarrhea and lead to hypokalemia. Chronic kidney disease (CKD). Medicines that help the body get rid of excess fluid (diuretics). Eating disorders, such as anorexia or bulimia. Low magnesium  levels in the body. Sweating a lot. What are the signs  or symptoms? Symptoms of this condition include: Weakness. Constipation. Fatigue. Muscle cramps. Mental confusion. Skipped heartbeats or irregular heartbeat (palpitations). Tingling or numbness. How is this diagnosed? This condition is diagnosed with a blood test. How is this treated? This condition may be treated by: Taking potassium supplements. Adjusting the medicines that you take. Eating more foods that contain a lot of potassium. If your potassium level is very low, you may need to get potassium through an IV and be monitored in the hospital. Follow these instructions at home: Eating and drinking  Eat a healthy diet. A healthy diet includes fresh fruits and vegetables, whole grains, healthy fats, and lean proteins. If told, eat more foods that contain a lot of potassium. These include: Nuts, such as peanuts and pistachios. Seeds, such as sunflower seeds and pumpkin seeds. Peas, lentils, and lima beans. Whole grain and bran cereals and breads. Fresh fruits and vegetables, such as apricots, avocado, bananas, cantaloupe, kiwi, oranges, tomatoes, asparagus, and potatoes. Juices, such as orange, tomato, and prune. Lean meats, including fish. Milk and milk products, such as yogurt. General instructions Take over-the-counter and prescription medicines only as told by your health care provider. This includes vitamins, natural food products, and supplements. Keep all follow-up visits. This is important. Contact a health care provider if: You have weakness that gets worse. You feel your heart pounding or racing. You vomit. You have diarrhea. You have diabetes and you have trouble keeping your blood sugar in your target range. Get help right away if: You have chest pain. You have shortness of breath. You have vomiting or diarrhea that lasts for more than 2 days. You faint. These symptoms may be an emergency. Get help right away. Call 911. Do not wait to see if the symptoms  will go away. Do not drive yourself to the hospital. Summary Hypokalemia means that the amount of potassium in the blood is lower than normal. This condition is diagnosed with a blood test. Hypokalemia may be treated by taking potassium supplements, adjusting the medicines that you take, or eating more foods that are high in potassium. If your potassium level is very low, you may need to get potassium through an IV and be monitored in the hospital. This information is not intended to replace advice given to you by your health care provider. Make sure you discuss any questions you have with your health care provider. Document Revised: 04/18/2021 Document Reviewed: 04/18/2021 Elsevier Patient Education  2024 [employer].

## 2024-06-02 NOTE — Assessment & Plan Note (Signed)
 Recommend gabapentin  100mg  TID PRN.

## 2024-06-02 NOTE — Assessment & Plan Note (Addendum)
 stage IV pancreatic cancer with liver metastasis was discussed with patient.  Other differential diagnosis include GI tract adenocarcinoma, cholangiocarcinoma etc. Baseline CEA is elevated.  CA 19-9 is normal. CT and MRI imaging is did not show any GI primary.I recommend a PET scan for further evaluation- patient rescheduled appt initially and later on PET scan was not done due to admission. I offered to call her daughter to give her updates and patient declines.   Labs are reviewed and discussed with patient. Patient did not tolerate Gemciabine and Abraxane  well.  On dose reduced monotherapy Gemcitaibine 900mg /m2 1 week on 1 week off  Proceed with cycle 6 D1 gemcitabine , CEA is rising.  She previously no showed to CT scan which is now rescheduled to 06/07/2024  She has established care with palliative care service.

## 2024-06-02 NOTE — Progress Notes (Signed)
 Pt here for follow up. She is requesting Oxycodone  refill

## 2024-06-02 NOTE — Progress Notes (Signed)
 Palliative Medicine [facility] at [facility] [phone] [phone]   Name: Kristina Cruz Date: 06/02/2024 MRN: [id number]  DOB: 23-Dec-1960  Patient Care Team: Debarah [name], FNP as PCP - General (Nurse Practitioner) Perla Evalene PARAS, MD as PCP - Cardiology (Cardiology) Babara Call, MD as Consulting Physician (Oncology) Maurie Rayfield BIRCH, RN as Oncology Nurse Navigator    REASON FOR CONSULTATION: Kristina Cruz is a 63 y.o. 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 Palliative care consulted to address goals and manage ongoing symptoms.    SOCIAL HISTORY:     reports that she has never smoked. She has never used smokeless tobacco. She reports that she does not drink alcohol and does not use drugs.  Patient never married.  Lives at home alone.  She has a daughter who is involved.  Patient also has caregivers for 20 hours weekly.  Patient previously worked as a Lawyer and in a Futures trader.  ADVANCE DIRECTIVES:  Does not have  CODE STATUS:   PAST MEDICAL HISTORY: Past Medical History:  Diagnosis Date   Anemia    Asthma    Back pain    Cataract    Diabetes mellitus without complication (HCC)    GERD (gastroesophageal reflux disease)    History of echocardiogram    Hypertension    Morbid obesity with BMI of 60.0-69.9, adult (HCC)    Restless leg syndrome     PAST SURGICAL HISTORY:  Past Surgical History:  Procedure Laterality Date   ABDOMINAL HYSTERECTOMY     CARDIOVERSION N/A 12/28/2023   Procedure: CARDIOVERSION;  Surgeon: Darliss Rogue, MD;  Location: [facility] ORS;  Service: Cardiovascular;  Laterality: N/A;   CATARACT EXTRACTION W/PHACO Left 04/13/2020   Procedure: CATARACT EXTRACTION PHACO AND INTRAOCULAR LENS PLACEMENT (IOC);  Surgeon: Ferol Rogue, MD;  Location: [facility] ORS;  Service: Ophthalmology;   Laterality: Left;  US  00:36.0 CDE 5.95 Fluid Pack lot # [id number]   HYSTEROSCOPY WITH D & C N/A 07/14/2017   Procedure: DILATATION AND CURETTAGE /HYSTEROSCOPY;  Surgeon: Arloa Lamar SQUIBB, MD;  Location: [facility] ORS;  Service: Gynecology;  Laterality: N/A;   IR IMAGING GUIDED PORT INSERTION  12/17/2023   POLYPECTOMY  2015   TEE WITHOUT CARDIOVERSION N/A 12/28/2023   Procedure: ECHOCARDIOGRAM, TRANSESOPHAGEAL;  Surgeon: Darliss Rogue, MD;  Location: [facility] ORS;  Service: Cardiovascular;  Laterality: N/A;    HEMATOLOGY/ONCOLOGY HISTORY:  Oncology History  Malignant neoplasm of pancreas (HCC)  08/21/2023 Imaging   CT abdomen pelvis with contrast showed 1. Masslike thickening involving the proximal and mid tail of pancreas with relative hypoenhancement measures approximately 3.9 x 5.5 cm. In the absence of signs/symptoms of acute pancreatitis underlying neoplastic process cannot be excluded. Further evaluation with nonemergent contrast enhanced MRI of the abdomen is advised. Note: Given patient's body habitus and the motion artifact observed on the current exam an MRI obtained at this time is likely to be severely limited and likely nondiagnostic. MRI should be obtained only once the patient is clinically stable, and is able to remain motionless and breath hold. 2. Patchy ground-glass and airspace densities identified within both lower lobes. These are new compared with the previous exam and are concerning for underlying inflammatory/infectious process. 3. Sigmoid diverticulosis without signs of acute diverticulitis. 4. Marked diastasis recti with ventral herniation of the large and small bowel loops. 5. Periumbilical hernia is containing fat only.   08/24/2023 Imaging  MRI abdomen wo contrast  1. Moderately suboptimal unenhanced exam. 2. Heterogeneous masslike thickening of the pancreatic body/tail again seen. The lesion remains indeterminate on this exam. Differential diagnosis includes  pancreatic tumor, autoimmune pancreatitis, mass forming chronic pancreatitis, etc. Please see above for follow-up recommendations. 3. Mild diffuse hepatic steatosis. There are indeterminate areas in the liver, which can also be better evaluated on the contrast-enhanced MRI abdomen. 4. Other observations, as described above.   11/05/2023 Initial Diagnosis   Pancreatic cancer metastasized to liver  Patient was hospitalized from 11/05/2023 - 11/10/2023 She presented to emergency room due to nausea, vomiting and abdominal pain. CT scan showed multiple hypodense hepatic lesions increasing number and size from prior imaging.  Pancreatic tail mass, hypodense lesion in the anterior spleen.  11/09/2023 ultrasound-guided biopsy was obtained. Pathology showed moderately to poorly differentiated adenocarcinoma with extensive areas of tumor necrosis. The adenocarcinoma is positive for cytokeratin 7 and CDX2 (focal).     Cytokeratin 20, TTF-1, and GATA3 are negative. The pattern of immunoreactivity is not specific and the differential diagnosis includes metastatic adenocarcinoma from the pancreaticobiliary tract and upper GI. Intrahepatic       cholangiocarcinoma is also a diagnostic possibility. Given the radiographic  findings a pancreaticobiliary primary is favored and correlation with clinical impression is required.    11/17/2023 Cancer Staging   Staging form: Exocrine Pancreas, AJCC 8th Edition - Clinical stage from 11/17/2023: Stage IV (cT3, cNX, pM1) - Signed by Babara Call, MD on 11/17/2023 Stage prefix: Initial diagnosis    Imaging   CT abdomen pelvis w contrast  1. Multiple hypodense hepatic lesions, increased in number and size from prior imaging, highly suspicious for metastatic disease. 2. Hypodense lesion in the pancreatic tail measures 7.2 x 3.9 cm, increased in size from prior. The increase in size favors pancreatic neoplasm, with additional etiologies less likely. 3. New hypodense lesion in the  anterior spleen, 13 mm, suspicious for metastatic disease. 4. Laxity of the anterior abdominal wall with midline ventral abdominal wall hernia containing small and to a lesser extent large bowel. No bowel obstruction or inflammation. 5. Colonic diverticulosis without diverticulitis.   Aortic Atherosclerosis (ICD10-I70.0)    11/27/2023 -  Chemotherapy   Patient is on Treatment Plan : Gemcitabine  D1,15 q28d       ALLERGIES:  is allergic to peanut-containing drug products.  MEDICATIONS:  Current Outpatient Medications  Medication Sig Dispense Refill   acetaminophen  (TYLENOL  8 HOUR ARTHRITIS PAIN) 650 MG CR tablet Take 3 tablets (1,950 mg total) by mouth every 8 (eight) hours as needed for pain. Do not exceed 4000 mg total in a day.  Home med.     apixaban  (ELIQUIS ) 5 MG TABS tablet Take 1 tablet (5 mg total) by mouth 2 (two) times daily. 60 tablet 1   azelastine  (ASTELIN ) 0.1 % nasal spray Place 1 spray into both nostrils 2 (two) times daily.     baclofen  (LIORESAL ) 10 MG tablet Take 10 mg by mouth 3 (three) times daily.     diphenoxylate -atropine  (LOMOTIL ) 2.5-0.025 MG tablet Take 2 tablets by mouth every 6 (six) hours as needed (for diarrhea unrelieved by imodium). 90 tablet 0   EPINEPHrine  0.3 mg/0.3 mL IJ SOAJ injection Inject 0.3 mg into the muscle as needed for anaphylaxis. 1 each 0   fluticasone  (FLONASE ) 50 MCG/ACT nasal spray Place 1 spray into both nostrils daily. 9.9 mL 0   fluticasone  furoate-vilanterol (BREO ELLIPTA ) 100-25 MCG/ACT AEPB Inhale 1 puff into the lungs daily. 28 each  1   furosemide  (LASIX ) 40 MG tablet Take 40 mg by mouth daily.     gabapentin  (NEURONTIN ) 300 MG capsule Take 1 capsule (300 mg total) by mouth 2 (two) times daily. 60 capsule 0   ipratropium-albuterol  (DUONEB) 0.5-2.5 (3) MG/3ML SOLN Take 3 mLs by nebulization every 4 (four) hours as needed (wheezing / shortness of breath). 360 mL 1   levocetirizine (XYZAL ) 5 MG tablet Take 5 mg by mouth daily.      lidocaine -prilocaine  (EMLA ) cream Apply 1 Application topically as needed. Apply to port and cover with saran wrap 1-2 hours prior to port access 30 g 1   magic mouthwash (lidocaine , diphenhydrAMINE , alum & mag hydroxide) suspension Swish and swallow 5 mLs 4 (four) times daily as needed for mouth pain. 360 mL 1   metFORMIN  (GLUCOPHAGE ) 1000 MG tablet Take 1,000 mg by mouth 2 (two) times daily.     metoprolol  tartrate (LOPRESSOR ) 25 MG tablet Take 1 tablet (25 mg total) by mouth 2 (two) times daily. 60 tablet 0   morphine  (MS CONTIN ) 15 MG 12 hr tablet Take 1 tablet (15 mg total) by mouth every 12 (twelve) hours. (Patient not taking: Reported on 06/02/2024) 60 tablet 0   naloxone  (NARCAN ) nasal spray 4 mg/0.1 mL SPRAY 1 SPRAY INTO ONE NOSTRIL AS DIRECTED FOR OPIOID OVERDOSE (TURN PERSON ON SIDE AFTER DOSE. IF NO RESPONSE IN 2-3 MINUTES OR PERSON RESPONDS BUT RELAPSES, REPEAT USING A NEW SPRAY DEVICE AND SPRAY INTO THE OTHER NOSTRIL. CALL 911 AFTER USE.) * EMERGENCY USE ONLY * 1 each 0   ondansetron  (ZOFRAN -ODT) 4 MG disintegrating tablet Take 1 tablet (4 mg total) by mouth every 8 (eight) hours as needed for nausea or vomiting. 45 tablet 0   oxyCODONE  (OXY IR/ROXICODONE ) 5 MG immediate release tablet Take 1 tablet (5 mg total) by mouth every 4 (four) hours as needed for moderate pain (pain score 4-6) or severe pain (pain score 7-10). 90 tablet 0   potassium chloride  (KLOR-CON  M) 10 MEQ tablet Take 1 tablet (10 mEq total) by mouth daily. 30 tablet 1   prochlorperazine  (COMPAZINE ) 10 MG tablet Take 1 tablet (10 mg total) by mouth every 6 (six) hours as needed for nausea or vomiting. 30 tablet 1   rOPINIRole  (REQUIP ) 2 MG tablet Take 2 mg by mouth in the morning and at bedtime.      spironolactone  (ALDACTONE ) 50 MG tablet Take 1 tablet (50 mg total) by mouth daily. 30 tablet 1   traZODone  (DESYREL ) 50 MG tablet Take 0.5-1 tablets (25-50 mg total) by mouth at bedtime as needed for sleep. 30 tablet 2    VENTOLIN  HFA 108 (90 Base) MCG/ACT inhaler Inhale 2 puffs into the lungs every 4 (four) hours as needed for wheezing or shortness of breath. 18 g 1   No current facility-administered medications for this visit.   Facility-Administered Medications Ordered in Other Visits  Medication Dose Route Frequency Provider Last Rate Last Admin   0.9 %  sodium chloride  infusion   Intravenous Continuous Babara Call, MD 10 mL/hr at 06/02/24 0917 New Bag at 06/02/24 0917   gemcitabine  (GEMZAR ) 2,090 mg in sodium chloride  0.9 % 250 mL chemo infusion  900 mg/m2 (Treatment Plan Recorded) Intravenous Once Babara Call, MD 610 mL/hr at 06/02/24 0940 2,090 mg at 06/02/24 0940    VITAL SIGNS: LMP 01/04/2018  There were no vitals filed for this visit.  Estimated body mass index is 43.46 kg/m as calculated from the following:  Height as of 05/19/24: 5' 1 (1.549 m).   Weight as of 05/19/24: 230 lb (104.3 kg).  LABS: CBC:    Component Value Date/Time   WBC 7.9 06/02/2024 0834   WBC 4.3 01/30/2024 1036   HGB 11.7 (L) 06/02/2024 0834   HGB 12.0 04/10/2014 1001   HCT 34.7 (L) 06/02/2024 0834   HCT 38.2 04/10/2014 1001   PLT 223 06/02/2024 0834   PLT 421 04/10/2014 1001   MCV 93.8 06/02/2024 0834   MCV 83 04/10/2014 1001   NEUTROABS 5.1 06/02/2024 0834   NEUTROABS 6.3 03/20/2014 0707   LYMPHSABS 1.5 06/02/2024 0834   LYMPHSABS 0.6 (L) 03/20/2014 0707   MONOABS 0.8 06/02/2024 0834   MONOABS 0.2 03/20/2014 0707   EOSABS 0.3 06/02/2024 0834   EOSABS 0.1 03/20/2014 0707   BASOSABS 0.1 06/02/2024 0834   BASOSABS 0.0 03/20/2014 0707   Comprehensive Metabolic Panel:    Component Value Date/Time   NA 138 06/02/2024 0834   NA 143 04/10/2014 1001   K 3.5 06/02/2024 0834   K 3.4 (L) 04/10/2014 1001   CL 103 06/02/2024 0834   CL 108 (H) 04/10/2014 1001   CO2 25 06/02/2024 0834   CO2 31 04/10/2014 1001   BUN 15 06/02/2024 0834   BUN 14 04/10/2014 1001   CREATININE 0.95 06/02/2024 0834   CREATININE 1.14  04/10/2014 1001   GLUCOSE 158 (H) 06/02/2024 0834   GLUCOSE 107 (H) 04/10/2014 1001   CALCIUM  8.9 06/02/2024 0834   CALCIUM  8.8 04/10/2014 1001   AST 24 06/02/2024 0834   ALT 18 06/02/2024 0834   ALT 14 04/10/2014 1001   ALKPHOS 190 (H) 06/02/2024 0834   ALKPHOS 82 04/10/2014 1001   BILITOT 1.2 06/02/2024 0834   PROT 8.0 06/02/2024 0834   PROT 7.5 04/10/2014 1001   ALBUMIN  3.4 (L) 06/02/2024 0834   ALBUMIN  3.1 (L) 04/10/2014 1001    RADIOGRAPHIC STUDIES: CT Angio Chest PE W and/or Wo Contrast Result Date: 05/12/2024 CLINICAL DATA:  Chest pain in a pancreatic cancer patient. Chest pain for 2 weeks. Nausea. EXAM: CT ANGIOGRAPHY CHEST WITH CONTRAST TECHNIQUE: Multidetector CT imaging of the chest was performed using the standard protocol during bolus administration of intravenous contrast. Multiplanar CT image reconstructions and MIPs were obtained to evaluate the vascular anatomy. RADIATION DOSE REDUCTION: This exam was performed according to the departmental dose-optimization program which includes automated exposure control, adjustment of the mA and/or kV according to patient size and/or use of iterative reconstruction technique. CONTRAST:  75mL OMNIPAQUE  IOHEXOL  350 MG/ML SOLN COMPARISON:  Chest radiograph 05/12/2024.  CT chest 12/25/2023 FINDINGS: Cardiovascular: There is good opacification of the central and segmental pulmonary arteries. Focal filling defects demonstrated in anterior right upper lung and posterior right lower lung segmental and subsegmental branches consistent with acute pulmonary embolus. Clot burden is low. Heart size is normal with RV to LV ratio measuring 0.8 suggesting unlikely right heart strain. Normal caliber thoracic aorta. No aortic dissection. Great vessel origins are patent. Mediastinum/Nodes: Right central venous catheter with tip in the right atrium. Esophagus is decompressed. Thyroid  gland is unremarkable. No significant lymphadenopathy. Lungs/Pleura: Linear  scarring in the lung bases. No airspace disease or consolidation in the lungs. No pleural effusion or pneumothorax. Upper Abdomen: Numerous low-attenuation lesions throughout the liver consistent with hepatic metastases. These are better visualized on previous study due to motion artifact today. Enlarged body of the pancreas is also poorly defined but likely corresponds to known pancreatic cancer. Upper abdominal varices are  present. Musculoskeletal: Degenerative changes throughout the spine. No focal bone lesions. Review of the MIP images confirms the above findings. IMPRESSION: 1. Positive examination for pulmonary embolus with segmental and subsegmental emboli demonstrated in the right lung. No evidence of right heart strain. 2. No evidence of active pulmonary disease. 3. Incidental note of multiple hepatic metastasis and pancreatic mass in the upper abdomen corresponding to known cancer and metastatic disease. Critical Value/emergent results were called by telephone at the time of interpretation on 05/12/2024 at 3:38 pm to provider Dr. Jossie , who verbally acknowledged these results. Electronically Signed   By: Elsie Gravely M.D.   On: 05/12/2024 15:48   DG Chest 2 View Result Date: 05/12/2024 EXAM: 2 VIEW(S) XRAY OF THE CHEST 05/12/2024 10:55:00 AM COMPARISON: 01/30/2024 CLINICAL HISTORY: cp. Pt to ED from cancer center for chest pain x2 weeks. +nausea. Pt had labs drawn at cancer center this am. FINDINGS: LINES, TUBES AND DEVICES: Stable right IJ Port-A-Cath in place. LUNGS AND PLEURA: No focal pulmonary opacity. No pulmonary edema. No pleural effusion. No pneumothorax. HEART AND MEDIASTINUM: Cardiomegaly, unchanged. No acute abnormality of the mediastinal silhouette. BONES AND SOFT TISSUES: Thoracic spondylosis. No acute osseous abnormality. IMPRESSION: 1. No acute cardiopulmonary abnormality. 2. Cardiomegaly, unchanged. Electronically signed by: Waddell Calk MD 05/12/2024 12:03 PM EDT RP Workstation:  [id number]    PERFORMANCE STATUS (ECOG) : 2 - Symptomatic, <50% confined to bed  Review of Systems Unless otherwise noted, a complete review of systems is negative.  Physical Exam General: NAD Cardiovascular: regular rate and rhythm Pulmonary: clear ant fields Abdomen: soft, nontender, + bowel sounds GU: no suprapubic tenderness Extremities: no edema, no joint deformities Skin: no rashes Neurological: Weakness but otherwise nonfocal  IMPRESSION: Follow-up visit.  Patient seen in infusion.  Patient rising CEA levels.  She is pending repeat CTs.  Patient asks about additional treatment options in the event of disease progression.  She will have follow-up with medical oncology post imaging.  Symptomatically, she says that she having pain in the upper abdomen.  She feels that the MS Contin  is less effective than oxycodone .  She request refill of oxycodone  today.  This was refilled by Dr. Babara.  She denies any adverse effects and is tolerating medication well.  Patient says that she is trying to moved to a senior apartment.    PLAN: - Continue current scope of treatment - Continue oxycodone /gabapentin  - Daily bowel regimen - Follow-up telephone visit 1 to 2 months   Patient expressed understanding and was in agreement with this plan. She also understands that She can call the clinic at any time with any questions, concerns, or complaints.     Time Total: 15 minutes  Visit consisted of counseling and education dealing with the complex and emotionally intense issues of symptom management and palliative care in the setting of serious and potentially life-threatening illness.Greater than 50%  of this time was spent counseling and coordinating care related to the above assessment and plan.  Signed by: Fonda Mower, PhD, NP-C

## 2024-06-02 NOTE — Assessment & Plan Note (Signed)
 Continue Eliquis 5mg  BID

## 2024-06-02 NOTE — Progress Notes (Signed)
 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  ASSESSMENT & PLAN:   Malignant neoplasm of pancreas (HCC) stage IV pancreatic cancer with liver metastasis was discussed with patient.  Other differential diagnosis include GI tract adenocarcinoma, cholangiocarcinoma etc. Baseline CEA is elevated.  CA 19-9 is normal. CT and MRI imaging is did not show any GI primary.I recommend a PET scan for further evaluation- patient rescheduled appt initially and later on PET scan was not done due to admission. I offered to call her daughter to give her updates and patient declines.   Labs are reviewed and discussed with patient. Patient did not tolerate Gemciabine and Abraxane  well.  On dose reduced monotherapy Gemcitaibine 900mg /m2 1 week on 1 week off  Proceed with cycle 6 D1 gemcitabine , CEA is rising.  She previously no showed to CT scan which is now rescheduled to 06/07/2024  She has established care with palliative care service.     Neoplasm related pain She is not taking MS contin  oxycodone  5 mg every 4-6 hours as needed -  .   Encounter for antineoplastic chemotherapy Chemotherapy plan as listed above  Chemotherapy-induced neuropathy Recommend gabapentin  100mg  TID PRN.    Atrial fibrillation with RVR (HCC) Atrial flutter: with RVR. S/p TEE & cardioverison on 12/28/23  follow-up with cardiology.  Continue metoprolol  and Eliquis     Pulmonary embolism (HCC) Continue Eliquis  5mg  BID    No orders of the defined types were placed in this encounter.  Follow-up 2 weeks All questions were answered. The patient knows to call the clinic with any problems, questions or concerns.  [name], MD, PhD [facility] Hematology Oncology 06/02/2024    HISTORY OF PRESENTING ILLNESS:  Kristina Cruz 63 y.o. female presents to establish care for Stage IV  pancreatic cancer I have reviewed her chart and materials related to her cancer extensively and collaborated history with the patient. Summary of oncologic history is as follows: Oncology History  Malignant neoplasm of pancreas (HCC)  08/21/2023 Imaging   CT abdomen pelvis with contrast showed 1. Masslike thickening involving the proximal and mid tail of pancreas with relative hypoenhancement measures approximately 3.9 x 5.5 cm. In the absence of signs/symptoms of acute pancreatitis underlying neoplastic process cannot be excluded. Further evaluation with nonemergent contrast enhanced MRI of the abdomen is advised. Note: Given patient's body habitus and the motion artifact observed on the current exam an MRI obtained at this time is likely to be severely limited and likely nondiagnostic. MRI should be obtained only once the patient is clinically stable, and is able to remain motionless and breath hold. 2. Patchy ground-glass and airspace densities identified within both lower lobes. These are new compared with the previous exam and are concerning for underlying inflammatory/infectious process. 3. Sigmoid diverticulosis without signs of acute diverticulitis. 4. Marked diastasis recti with ventral herniation of the large and small bowel loops. 5. Periumbilical hernia is containing fat only.   08/24/2023 Imaging   MRI abdomen wo contrast  1. Moderately suboptimal unenhanced exam. 2. Heterogeneous masslike thickening of the pancreatic body/tail again seen. The lesion remains indeterminate on this exam. Differential diagnosis includes pancreatic tumor, autoimmune pancreatitis, mass forming chronic pancreatitis, etc. Please see above for follow-up recommendations. 3. Mild diffuse hepatic steatosis. There are indeterminate areas in the liver, which can also be better evaluated on the contrast-enhanced MRI abdomen. 4. Other observations, as described above.   11/05/2023 Initial  Diagnosis    Pancreatic cancer metastasized to liver  Patient was hospitalized from 11/05/2023 - 11/10/2023 She presented to emergency room due to nausea, vomiting and abdominal pain. CT scan showed multiple hypodense hepatic lesions increasing number and size from prior imaging.  Pancreatic tail mass, hypodense lesion in the anterior spleen.  11/09/2023 ultrasound-guided biopsy was obtained. Pathology showed moderately to poorly differentiated adenocarcinoma with extensive areas of tumor necrosis. The adenocarcinoma is positive for cytokeratin 7 and CDX2 (focal).     Cytokeratin 20, TTF-1, and GATA3 are negative. The pattern of immunoreactivity is not specific and the differential diagnosis includes metastatic adenocarcinoma from the pancreaticobiliary tract and upper GI. Intrahepatic       cholangiocarcinoma is also a diagnostic possibility. Given the radiographic  findings a pancreaticobiliary primary is favored and correlation with clinical impression is required.    11/17/2023 Cancer Staging   Staging form: Exocrine Pancreas, AJCC 8th Edition - Clinical stage from 11/17/2023: Stage IV (cT3, cNX, pM1) - Signed by Babara Call, MD on 11/17/2023 Stage prefix: Initial diagnosis    Imaging   CT abdomen pelvis w contrast  1. Multiple hypodense hepatic lesions, increased in number and size from prior imaging, highly suspicious for metastatic disease. 2. Hypodense lesion in the pancreatic tail measures 7.2 x 3.9 cm, increased in size from prior. The increase in size favors pancreatic neoplasm, with additional etiologies less likely. 3. New hypodense lesion in the anterior spleen, 13 mm, suspicious for metastatic disease. 4. Laxity of the anterior abdominal wall with midline ventral abdominal wall hernia containing small and to a lesser extent large bowel. No bowel obstruction or inflammation. 5. Colonic diverticulosis without diverticulitis.   Aortic Atherosclerosis (ICD10-I70.0)    11/27/2023 -  Chemotherapy    Patient is on Treatment Plan : Gemcitabine  D1,15 q28d      Hospitalization due to Atrial flutter: with RVR. S/p TEE & cardioverison on 12/28/23, severe sepsis, gastroenteritis.  01/30/2024 - 01/31/2024 patient was hospitalized due to CHF exacerbation, she also received antibiotics for E. coli UTI.  Today she reports feeling well. Patient takes Eliquis  5mg  BID.  No new complaints today.     MEDICAL HISTORY:  Past Medical History:  Diagnosis Date   Anemia    Asthma    Back pain    Cataract    Diabetes mellitus without complication (HCC)    GERD (gastroesophageal reflux disease)    History of echocardiogram    Hypertension    Morbid obesity with BMI of 60.0-69.9, adult (HCC)    Restless leg syndrome     SURGICAL HISTORY: Past Surgical History:  Procedure Laterality Date   ABDOMINAL HYSTERECTOMY     CARDIOVERSION N/A 12/28/2023   Procedure: CARDIOVERSION;  Surgeon: Darliss Rogue, MD;  Location: [facility] ORS;  Service: Cardiovascular;  Laterality: N/A;   CATARACT EXTRACTION W/PHACO Left 04/13/2020   Procedure: CATARACT EXTRACTION PHACO AND INTRAOCULAR LENS PLACEMENT (IOC);  Surgeon: Ferol Rogue, MD;  Location: [facility] ORS;  Service: Ophthalmology;  Laterality: Left;  US  00:36.0 CDE 5.95 Fluid Pack lot # [id number]   HYSTEROSCOPY WITH D & C N/A 07/14/2017   Procedure: DILATATION AND CURETTAGE /HYSTEROSCOPY;  Surgeon: Arloa Lamar SQUIBB, MD;  Location: [facility] ORS;  Service: Gynecology;  Laterality: N/A;   IR IMAGING GUIDED PORT INSERTION  12/17/2023   POLYPECTOMY  2015   TEE WITHOUT CARDIOVERSION N/A 12/28/2023   Procedure: ECHOCARDIOGRAM, TRANSESOPHAGEAL;  Surgeon: Darliss Rogue, MD;  Location: [facility] ORS;  Service: Cardiovascular;  Laterality: N/A;    SOCIAL  HISTORY: Social History   Socioeconomic History   Marital status: Single    Spouse name: Not on file   Number of children: Not on file   Years of education: Not on file   Highest education level: Not on file  Occupational  History   Not on file  Tobacco Use   Smoking status: Never   Smokeless tobacco: Never  Vaping Use   Vaping status: Never Used  Substance and Sexual Activity   Alcohol use: No   Drug use: No   Sexual activity: Never    Birth control/protection: None  Other Topics Concern   Not on file  Social History Narrative   Not on file   Social Drivers of Health   Financial Resource Strain: Low Risk  (05/11/2024)   Received from [facility]   Overall Financial Resource Strain (CARDIA)    Difficulty of Paying Living Expenses: Not hard at all  Food Insecurity: No Food Insecurity (05/11/2024)   Received from [facility]   Hunger Vital Sign    Within the past 12 months, you worried that your food would run out before you got the money to buy more.: Never true    Within the past 12 months, the food you bought just didn't last and you didn't have money to get more.: Never true  Transportation Needs: No Transportation Needs (05/11/2024)   Received from [facility] - Transportation    In the past 12 months, has lack of transportation kept you from medical appointments or from getting medications?: No    Lack of Transportation (Non-Medical): No  Physical Activity: Not on file  Stress: Not on file  Social Connections: Socially Isolated (12/25/2023)   Social Connection and Isolation Panel    Frequency of Communication with Friends and Family: More than three times a week    Frequency of Social Gatherings with Friends and Family: More than three times a week    Attends Religious Services: Never    Database administrator or Organizations: No    Attends Banker Meetings: Never    Marital Status: Never married  Intimate Partner Violence: Patient Declined (01/30/2024)   Humiliation, Afraid, Rape, and Kick questionnaire    Fear of Current or Ex-Partner: Patient declined    Emotionally Abused: Patient declined    Physically  Abused: Patient declined    Sexually Abused: Patient declined    FAMILY HISTORY: Family History  Problem Relation Age of Onset   Breast cancer Mother 35   Diabetes Mother    Hypertension Mother    Ovarian cancer Paternal Aunt        ?   Diabetes Father    Hypertension Father     ALLERGIES:  is allergic to peanut-containing drug products.  MEDICATIONS:  Current Outpatient Medications  Medication Sig Dispense Refill   acetaminophen  (TYLENOL  8 HOUR ARTHRITIS PAIN) 650 MG CR tablet Take 3 tablets (1,950 mg total) by mouth every 8 (eight) hours as needed for pain. Do not exceed 4000 mg total in a day.  Home med.     apixaban  (ELIQUIS ) 5 MG TABS tablet Take 1 tablet (5 mg total) by mouth 2 (two) times daily. 60 tablet 1   azelastine  (ASTELIN ) 0.1 % nasal spray Place 1 spray into both nostrils 2 (two) times daily.     diphenoxylate -atropine  (LOMOTIL ) 2.5-0.025 MG tablet Take 2 tablets by mouth every 6 (six) hours as needed (for diarrhea unrelieved  by imodium). 90 tablet 0   EPINEPHrine  0.3 mg/0.3 mL IJ SOAJ injection Inject 0.3 mg into the muscle as needed for anaphylaxis. 1 each 0   fluticasone  (FLONASE ) 50 MCG/ACT nasal spray Place 1 spray into both nostrils daily. 9.9 mL 0   fluticasone  furoate-vilanterol (BREO ELLIPTA ) 100-25 MCG/ACT AEPB Inhale 1 puff into the lungs daily. 28 each 1   furosemide  (LASIX ) 40 MG tablet Take 40 mg by mouth daily.     gabapentin  (NEURONTIN ) 300 MG capsule Take 1 capsule (300 mg total) by mouth 2 (two) times daily. 60 capsule 0   ipratropium-albuterol  (DUONEB) 0.5-2.5 (3) MG/3ML SOLN Take 3 mLs by nebulization every 4 (four) hours as needed (wheezing / shortness of breath). 360 mL 1   levocetirizine (XYZAL ) 5 MG tablet Take 5 mg by mouth daily.     lidocaine -prilocaine  (EMLA ) cream Apply 1 Application topically as needed. Apply to port and cover with saran wrap 1-2 hours prior to port access 30 g 1   magic mouthwash (lidocaine , diphenhydrAMINE , alum & mag  hydroxide) suspension Swish and swallow 5 mLs 4 (four) times daily as needed for mouth pain. 360 mL 1   metFORMIN  (GLUCOPHAGE ) 1000 MG tablet Take 1,000 mg by mouth 2 (two) times daily.     metoprolol  tartrate (LOPRESSOR ) 25 MG tablet Take 1 tablet (25 mg total) by mouth 2 (two) times daily. 60 tablet 0   naloxone  (NARCAN ) nasal spray 4 mg/0.1 mL SPRAY 1 SPRAY INTO ONE NOSTRIL AS DIRECTED FOR OPIOID OVERDOSE (TURN PERSON ON SIDE AFTER DOSE. IF NO RESPONSE IN 2-3 MINUTES OR PERSON RESPONDS BUT RELAPSES, REPEAT USING A NEW SPRAY DEVICE AND SPRAY INTO THE OTHER NOSTRIL. CALL 911 AFTER USE.) * EMERGENCY USE ONLY * 1 each 0   ondansetron  (ZOFRAN -ODT) 4 MG disintegrating tablet Take 1 tablet (4 mg total) by mouth every 8 (eight) hours as needed for nausea or vomiting. 45 tablet 0   potassium chloride  (KLOR-CON  M) 10 MEQ tablet Take 1 tablet (10 mEq total) by mouth daily. 30 tablet 1   prochlorperazine  (COMPAZINE ) 10 MG tablet Take 1 tablet (10 mg total) by mouth every 6 (six) hours as needed for nausea or vomiting. 30 tablet 1   rOPINIRole  (REQUIP ) 2 MG tablet Take 2 mg by mouth in the morning and at bedtime.      spironolactone  (ALDACTONE ) 50 MG tablet Take 1 tablet (50 mg total) by mouth daily. 30 tablet 1   traZODone  (DESYREL ) 50 MG tablet Take 0.5-1 tablets (25-50 mg total) by mouth at bedtime as needed for sleep. 30 tablet 2   VENTOLIN  HFA 108 (90 Base) MCG/ACT inhaler Inhale 2 puffs into the lungs every 4 (four) hours as needed for wheezing or shortness of breath. 18 g 1   baclofen  (LIORESAL ) 10 MG tablet Take 10 mg by mouth 3 (three) times daily.     morphine  (MS CONTIN ) 15 MG 12 hr tablet Take 1 tablet (15 mg total) by mouth every 12 (twelve) hours. (Patient not taking: Reported on 06/02/2024) 60 tablet 0   oxyCODONE  (OXY IR/ROXICODONE ) 5 MG immediate release tablet Take 1 tablet (5 mg total) by mouth every 4 (four) hours as needed for moderate pain (pain score 4-6) or severe pain (pain score 7-10).  90 tablet 0   No current facility-administered medications for this visit.   Facility-Administered Medications Ordered in Other Visits  Medication Dose Route Frequency Provider Last Rate Last Admin   0.9 %  sodium chloride  infusion  Intravenous Continuous Babara Call, MD   Stopped at 06/02/24 1013    Review of Systems  Constitutional:  Positive for fatigue. Negative for appetite change, chills and fever.  HENT:   Negative for hearing loss and voice change.   Eyes:  Negative for eye problems.  Respiratory:  Negative for chest tightness, cough and shortness of breath.   Cardiovascular:  Positive for leg swelling. Negative for chest pain.  Gastrointestinal:  Positive for nausea. Negative for abdominal distention, abdominal pain and blood in stool.  Endocrine: Negative for hot flashes.  Genitourinary:  Negative for difficulty urinating and frequency.   Musculoskeletal:  Negative for arthralgias.  Skin:  Negative for itching and rash.  Neurological:  Negative for extremity weakness.  Hematological:  Negative for adenopathy.  Psychiatric/Behavioral:  Negative for confusion.      PHYSICAL EXAMINATION: ECOG PERFORMANCE STATUS: 1 - Symptomatic but completely ambulatory  Vitals:   06/02/24 0848  BP: 136/72  Pulse: 64  Resp: 18  Temp: (!) 97 F (36.1 C)   Filed Weights     Physical Exam Constitutional:      General: She is not in acute distress.    Appearance: She is obese. She is not diaphoretic.  HENT:     Head: Normocephalic and atraumatic.  Eyes:     General: No scleral icterus. Cardiovascular:     Rate and Rhythm: Normal rate and regular rhythm.  Pulmonary:     Effort: Pulmonary effort is normal. No respiratory distress.     Breath sounds: No wheezing or rales.     Comments: Decreased breath sound bilaterally Abdominal:     General: Bowel sounds are normal. There is no distension.     Palpations: Abdomen is soft.  Musculoskeletal:        General: Swelling present.  Normal range of motion.     Cervical back: Normal range of motion and neck supple.     Right lower leg: Edema present.     Left lower leg: Edema present.  Skin:    General: Skin is warm and dry.     Findings: No erythema.  Neurological:     Mental Status: She is alert and oriented to person, place, and time. Mental status is at baseline.     Motor: No abnormal muscle tone.  Psychiatric:        Mood and Affect: Mood and affect normal.      LABORATORY DATA:  I have reviewed the data as listed    Latest Ref Rng & Units 06/02/2024    8:34 AM 05/19/2024    8:26 AM 05/12/2024    9:23 AM  CBC  WBC 4.0 - 10.5 K/uL 7.9  6.5  5.8   Hemoglobin 12.0 - 15.0 g/dL 88.2  88.2  88.6   Hematocrit 36.0 - 46.0 % 34.7  34.7  34.2   Platelets 150 - 400 K/uL 223  288  233       Latest Ref Rng & Units 06/02/2024    8:34 AM 05/19/2024    8:26 AM 05/12/2024    9:23 AM  CMP  Glucose 70 - 99 mg/dL 841  860  820   BUN 8 - 23 mg/dL 15  13  12    Creatinine 0.44 - 1.00 mg/dL 9.04  9.20  9.08   Sodium 135 - 145 mmol/L 138  137  136   Potassium 3.5 - 5.1 mmol/L 3.5  3.4  3.5   Chloride 98 - 111 mmol/L 103  103  103   CO2 22 - 32 mmol/L 25  24  25    Calcium  8.9 - 10.3 mg/dL 8.9  9.0  9.0   Total Protein 6.5 - 8.1 g/dL 8.0  7.8  7.9   Total Bilirubin 0.0 - 1.2 mg/dL 1.2  1.1  0.9   Alkaline Phos 38 - 126 U/L 190  169  156   AST 15 - 41 U/L 24  25  23    ALT 0 - 44 U/L 18  16  17       RADIOGRAPHIC STUDIES: I have personally reviewed the radiological images as listed and agreed with the findings in the report. CT Angio Chest PE W and/or Wo Contrast Result Date: 05/12/2024 CLINICAL DATA:  Chest pain in a pancreatic cancer patient. Chest pain for 2 weeks. Nausea. EXAM: CT ANGIOGRAPHY CHEST WITH CONTRAST TECHNIQUE: Multidetector CT imaging of the chest was performed using the standard protocol during bolus administration of intravenous contrast. Multiplanar CT image reconstructions and MIPs were obtained to  evaluate the vascular anatomy. RADIATION DOSE REDUCTION: This exam was performed according to the departmental dose-optimization program which includes automated exposure control, adjustment of the mA and/or kV according to patient size and/or use of iterative reconstruction technique. CONTRAST:  75mL OMNIPAQUE  IOHEXOL  350 MG/ML SOLN COMPARISON:  Chest radiograph 05/12/2024.  CT chest 12/25/2023 FINDINGS: Cardiovascular: There is good opacification of the central and segmental pulmonary arteries. Focal filling defects demonstrated in anterior right upper lung and posterior right lower lung segmental and subsegmental branches consistent with acute pulmonary embolus. Clot burden is low. Heart size is normal with RV to LV ratio measuring 0.8 suggesting unlikely right heart strain. Normal caliber thoracic aorta. No aortic dissection. Great vessel origins are patent. Mediastinum/Nodes: Right central venous catheter with tip in the right atrium. Esophagus is decompressed. Thyroid  gland is unremarkable. No significant lymphadenopathy. Lungs/Pleura: Linear scarring in the lung bases. No airspace disease or consolidation in the lungs. No pleural effusion or pneumothorax. Upper Abdomen: Numerous low-attenuation lesions throughout the liver consistent with hepatic metastases. These are better visualized on previous study due to motion artifact today. Enlarged body of the pancreas is also poorly defined but likely corresponds to known pancreatic cancer. Upper abdominal varices are present. Musculoskeletal: Degenerative changes throughout the spine. No focal bone lesions. Review of the MIP images confirms the above findings. IMPRESSION: 1. Positive examination for pulmonary embolus with segmental and subsegmental emboli demonstrated in the right lung. No evidence of right heart strain. 2. No evidence of active pulmonary disease. 3. Incidental note of multiple hepatic metastasis and pancreatic mass in the upper abdomen  corresponding to known cancer and metastatic disease. Critical Value/emergent results were called by telephone at the time of interpretation on 05/12/2024 at 3:38 pm to provider Dr. Jossie , who verbally acknowledged these results. Electronically Signed   By: Elsie Gravely M.D.   On: 05/12/2024 15:48   DG Chest 2 View Result Date: 05/12/2024 EXAM: 2 VIEW(S) XRAY OF THE CHEST 05/12/2024 10:55:00 AM COMPARISON: 01/30/2024 CLINICAL HISTORY: cp. Pt to ED from cancer center for chest pain x2 weeks. +nausea. Pt had labs drawn at cancer center this am. FINDINGS: LINES, TUBES AND DEVICES: Stable right IJ Port-A-Cath in place. LUNGS AND PLEURA: No focal pulmonary opacity. No pulmonary edema. No pleural effusion. No pneumothorax. HEART AND MEDIASTINUM: Cardiomegaly, unchanged. No acute abnormality of the mediastinal silhouette. BONES AND SOFT TISSUES: Thoracic spondylosis. No acute osseous abnormality. IMPRESSION: 1. No acute cardiopulmonary abnormality. 2. Cardiomegaly, unchanged. Electronically  signed by: Waddell Calk MD 05/12/2024 12:03 PM EDT RP Workstation: [id number]

## 2024-06-02 NOTE — Assessment & Plan Note (Signed)
 Chemotherapy plan as listed above

## 2024-06-03 LAB — CEA: CEA: 121 ng/mL — ABNORMAL HIGH (ref 0.0–4.7)

## 2024-06-03 LAB — CANCER ANTIGEN 19-9: CA 19-9: 2 U/mL (ref 0–35)

## 2024-06-07 ENCOUNTER — Ambulatory Visit
Admission: RE | Admit: 2024-06-07 | Discharge: 2024-06-07 | Disposition: A | Discharge status: Home / Self Care | Source: Ambulatory Visit | Attending: Oncology | Admitting: Oncology

## 2024-06-07 DIAGNOSIS — C259 Malignant neoplasm of pancreas, unspecified: Secondary | ICD-10-CM | POA: Diagnosis present

## 2024-06-07 MED ORDER — IOHEXOL 300 MG/ML  SOLN
100.0000 mL | Freq: Once | INTRAMUSCULAR | Status: AC | PRN
  Administered 2024-06-07: 100 mL via INTRAVENOUS

## 2024-06-16 ENCOUNTER — Encounter: Payer: Self-pay | Admitting: Oncology

## 2024-06-16 ENCOUNTER — Inpatient Hospital Stay

## 2024-06-16 ENCOUNTER — Ambulatory Visit

## 2024-06-16 ENCOUNTER — Inpatient Hospital Stay (HOSPITAL_BASED_OUTPATIENT_CLINIC_OR_DEPARTMENT_OTHER): Admitting: Oncology

## 2024-06-16 ENCOUNTER — Other Ambulatory Visit: Payer: Self-pay

## 2024-06-16 ENCOUNTER — Telehealth: Payer: Self-pay

## 2024-06-16 ENCOUNTER — Other Ambulatory Visit

## 2024-06-16 VITALS — BP 120/78 | HR 74 | Temp 97.8°F | Resp 18 | Ht 61.0 in | Wt 230.0 lb

## 2024-06-16 DIAGNOSIS — I4891 Unspecified atrial fibrillation: Secondary | ICD-10-CM | POA: Diagnosis not present

## 2024-06-16 DIAGNOSIS — C259 Malignant neoplasm of pancreas, unspecified: Secondary | ICD-10-CM

## 2024-06-16 DIAGNOSIS — G620 Drug-induced polyneuropathy: Secondary | ICD-10-CM

## 2024-06-16 DIAGNOSIS — E876 Hypokalemia: Secondary | ICD-10-CM

## 2024-06-16 DIAGNOSIS — T451X5A Adverse effect of antineoplastic and immunosuppressive drugs, initial encounter: Secondary | ICD-10-CM

## 2024-06-16 DIAGNOSIS — G893 Neoplasm related pain (acute) (chronic): Secondary | ICD-10-CM | POA: Diagnosis not present

## 2024-06-16 DIAGNOSIS — I2699 Other pulmonary embolism without acute cor pulmonale: Secondary | ICD-10-CM

## 2024-06-16 DIAGNOSIS — Z5111 Encounter for antineoplastic chemotherapy: Secondary | ICD-10-CM | POA: Diagnosis not present

## 2024-06-16 LAB — CBC WITH DIFFERENTIAL (CANCER CENTER ONLY)
Abs Immature Granulocytes: 0.04 K/uL (ref 0.00–0.07)
Basophils Absolute: 0 K/uL (ref 0.0–0.1)
Basophils Relative: 0 %
Eosinophils Absolute: 0.4 K/uL (ref 0.0–0.5)
Eosinophils Relative: 5 %
HCT: 37.1 % (ref 36.0–46.0)
Hemoglobin: 12.4 g/dL (ref 12.0–15.0)
Immature Granulocytes: 1 %
Lymphocytes Relative: 17 %
Lymphs Abs: 1.3 K/uL (ref 0.7–4.0)
MCH: 31.6 pg (ref 26.0–34.0)
MCHC: 33.4 g/dL (ref 30.0–36.0)
MCV: 94.6 fL (ref 80.0–100.0)
Monocytes Absolute: 0.8 K/uL (ref 0.1–1.0)
Monocytes Relative: 10 %
Neutro Abs: 5.3 K/uL (ref 1.7–7.7)
Neutrophils Relative %: 67 %
Platelet Count: 175 K/uL (ref 150–400)
RBC: 3.92 MIL/uL (ref 3.87–5.11)
RDW: 16 % — ABNORMAL HIGH (ref 11.5–15.5)
WBC Count: 7.8 K/uL (ref 4.0–10.5)
nRBC: 0 % (ref 0.0–0.2)

## 2024-06-16 LAB — CMP (CANCER CENTER ONLY)
ALT: 17 U/L (ref 0–44)
AST: 29 U/L (ref 15–41)
Albumin: 3.7 g/dL (ref 3.5–5.0)
Alkaline Phosphatase: 190 U/L — ABNORMAL HIGH (ref 38–126)
Anion gap: 12 (ref 5–15)
BUN: 12 mg/dL (ref 8–23)
CO2: 23 mmol/L (ref 22–32)
Calcium: 9.1 mg/dL (ref 8.9–10.3)
Chloride: 99 mmol/L (ref 98–111)
Creatinine: 0.85 mg/dL (ref 0.44–1.00)
GFR, Estimated: >60 mL/min (ref >60)
Glucose, Bld: 154 mg/dL — ABNORMAL HIGH (ref 70–99)
Potassium: 3.4 mmol/L — ABNORMAL LOW (ref 3.5–5.1)
Sodium: 134 mmol/L — ABNORMAL LOW (ref 135–145)
Total Bilirubin: 1.3 mg/dL — ABNORMAL HIGH (ref 0.0–1.2)
Total Protein: 8.2 g/dL — ABNORMAL HIGH (ref 6.5–8.1)

## 2024-06-16 MED ORDER — ONDANSETRON 8 MG PO TBDP: 8.0000 mg | ORAL_TABLET | Freq: Three times a day (TID) | ORAL | 3 refills | Status: DC | PRN

## 2024-06-16 MED ORDER — PROCHLORPERAZINE MALEATE 10 MG PO TABS: 10.0000 mg | ORAL_TABLET | Freq: Four times a day (QID) | ORAL | 1 refills | Status: DC | PRN

## 2024-06-16 MED ORDER — DEXAMETHASONE 4 MG PO TABS: 4.0000 mg | ORAL_TABLET | Freq: Every day | ORAL | 1 refills | Status: DC

## 2024-06-16 MED ORDER — LOPERAMIDE HCL 2 MG PO CAPS: 2.0000 mg | ORAL_CAPSULE | ORAL | 2 refills | Status: DC

## 2024-06-16 NOTE — Progress Notes (Signed)
 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  ASSESSMENT & PLAN:   Malignant neoplasm of pancreas (HCC) stage IV pancreatic cancer with liver metastasis was discussed with patient.  Other differential diagnosis include GI tract adenocarcinoma, cholangiocarcinoma etc. Baseline CEA is elevated.  CA 19-9 is normal. CT and MRI imaging is did not show any GI primary.I recommend a PET scan for further evaluation- patient rescheduled appt initially and later on PET scan was not done due to admission. I offered to call her daughter to give her updates and patient declines.   Labs are reviewed and discussed with patient. Patient did not tolerate Gemciabine and Abraxane  well.  Currently on dose reduced monotherapy Gemcitaibine 900mg /m2 1 week on 1 week off  CT showed moderate liver metastatic disease progression.   Patient desire further treatments. Discussed about option of switching to 5-FU and liposomal irinotecan, given her PS, will dose reduce. Rationale and side effects were reviewed with patient.  She agrees with the plan. Will arrange her to to treatment early next week. I discussed the detail instructions of antiemetics and imodium.  She will follow up 1 week after treatment for tolerability evaluation.   Follow up with palliative care service.   Chemotherapy-induced neuropathy Recommend gabapentin  100mg  TID PRN.    Atrial fibrillation with RVR (HCC) Atrial flutter: with RVR. S/p TEE & cardioverison on 12/28/23  follow-up with cardiology.  Continue metoprolol  and Eliquis     Encounter for antineoplastic chemotherapy Chemotherapy plan as listed above  Neoplasm related pain She is not taking MS contin  oxycodone  5 mg every 4-6 hours as needed -  .   Pulmonary embolism (HCC) Continue Eliquis  5mg  BID  Hypokalemia Recommend potassium 10meq  daily    Orders Placed This Encounter  Procedures   Consent Attestation for Oncology Treatment    The patient is informed of risks, benefits, side-effects of the prescribed oncology treatment. Potential short term and long term side effects and response rates discussed. After a long discussion, the patient made informed decision to proceed.:   Yes   CBC with Differential (Cancer Center Only)    Standing Status:   Future    Expected Date:   07/04/2024    Expiration Date:   07/04/2025   CMP (Cancer Center only)    Standing Status:   Future    Expected Date:   07/04/2024    Expiration Date:   07/04/2025   Follow-up per LOS All questions were answered. The patient knows to call the clinic with any problems, questions or concerns.  [name], MD, PhD [facility] Hematology Oncology 06/16/2024    HISTORY OF PRESENTING ILLNESS:  Kristina Cruz 63 y.o. female presents to establish care for Stage IV pancreatic cancer I have reviewed her chart and materials related to her cancer extensively and collaborated history with the patient. Summary of oncologic history is as follows: Oncology History  Malignant neoplasm of pancreas (HCC)  08/21/2023 Imaging   CT abdomen pelvis with contrast showed 1. Masslike thickening involving the proximal and mid tail of pancreas with relative hypoenhancement measures approximately 3.9 x 5.5 cm. In the absence of signs/symptoms of acute pancreatitis underlying neoplastic process cannot be excluded. Further evaluation with nonemergent contrast enhanced MRI of the abdomen is advised. Note: Given patient's body habitus and the motion artifact observed on the current exam an MRI obtained at this time is likely to be severely limited and  likely nondiagnostic. MRI should be obtained only once the patient is clinically stable, and is able to remain motionless and breath hold. 2. Patchy ground-glass and airspace densities identified within both lower lobes.  These are new compared with the previous exam and are concerning for underlying inflammatory/infectious process. 3. Sigmoid diverticulosis without signs of acute diverticulitis. 4. Marked diastasis recti with ventral herniation of the large and small bowel loops. 5. Periumbilical hernia is containing fat only.   08/24/2023 Imaging   MRI abdomen wo contrast  1. Moderately suboptimal unenhanced exam. 2. Heterogeneous masslike thickening of the pancreatic body/tail again seen. The lesion remains indeterminate on this exam. Differential diagnosis includes pancreatic tumor, autoimmune pancreatitis, mass forming chronic pancreatitis, etc. Please see above for follow-up recommendations. 3. Mild diffuse hepatic steatosis. There are indeterminate areas in the liver, which can also be better evaluated on the contrast-enhanced MRI abdomen. 4. Other observations, as described above.   11/05/2023 Initial Diagnosis   Pancreatic cancer metastasized to liver  Patient was hospitalized from 11/05/2023 - 11/10/2023 She presented to emergency room due to nausea, vomiting and abdominal pain. CT scan showed multiple hypodense hepatic lesions increasing number and size from prior imaging.  Pancreatic tail mass, hypodense lesion in the anterior spleen.  11/09/2023 ultrasound-guided biopsy was obtained. Pathology showed moderately to poorly differentiated adenocarcinoma with extensive areas of tumor necrosis. The adenocarcinoma is positive for cytokeratin 7 and CDX2 (focal).     Cytokeratin 20, TTF-1, and GATA3 are negative. The pattern of immunoreactivity is not specific and the differential diagnosis includes metastatic adenocarcinoma from the pancreaticobiliary tract and upper GI. Intrahepatic       cholangiocarcinoma is also a diagnostic possibility. Given the radiographic  findings a pancreaticobiliary primary is favored and correlation with clinical impression is required.    11/17/2023 Cancer Staging   Staging  form: Exocrine Pancreas, AJCC 8th Edition - Clinical stage from 11/17/2023: Stage IV (cT3, cNX, pM1) - Signed by Babara Call, MD on 11/17/2023 Stage prefix: Initial diagnosis    Imaging   CT abdomen pelvis w contrast  1. Multiple hypodense hepatic lesions, increased in number and size from prior imaging, highly suspicious for metastatic disease. 2. Hypodense lesion in the pancreatic tail measures 7.2 x 3.9 cm, increased in size from prior. The increase in size favors pancreatic neoplasm, with additional etiologies less likely. 3. New hypodense lesion in the anterior spleen, 13 mm, suspicious for metastatic disease. 4. Laxity of the anterior abdominal wall with midline ventral abdominal wall hernia containing small and to a lesser extent large bowel. No bowel obstruction or inflammation. 5. Colonic diverticulosis without diverticulitis.   Aortic Atherosclerosis (ICD10-I70.0)    11/27/2023 - 06/02/2024 Chemotherapy   Patient is on Treatment Plan : Gemcitabine  D1,15 q28d     06/07/2024 Imaging   CT abdomen pelvis w contrast  1. Similar size of infiltrative pancreatic body/tail primary. Similar nonspecific edema in the anterior pararenal space. Cannot exclude superimposed pancreatitis. 2. Moderate progression of hepatic metastasis. 3. New hypoattenuating pancreatic lesions. Given subcapsular position and splenic vein involvement, infarcts are favored. Cannot exclude concurrent small volume metastasis. 4. Resolved left lower lobe cavitary nodule. 5. New trace perihepatic ascites. 6. Rectal stool volume suggests constipation. 7.  Aortic Atherosclerosis    06/20/2024 -  Chemotherapy   Patient is on Treatment Plan : PANCREAS Liposomal Irinotecan + Leucovorin + 5-FU IVCI q14d      Hospitalization due to Atrial flutter: with RVR. S/p TEE & cardioverison on 12/28/23, severe sepsis, gastroenteritis.  01/30/2024 - 01/31/2024 patient was hospitalized due to CHF exacerbation, she also received  antibiotics for E. coli UTI.  Today she reports feeling well. Patient takes Eliquis  5mg  BID.  No new complaints today. Appetite is fair and wright is stable.     MEDICAL HISTORY:  Past Medical History:  Diagnosis Date   Anemia    Asthma    Back pain    Cataract    Diabetes mellitus without complication (HCC)    GERD (gastroesophageal reflux disease)    History of echocardiogram    Hypertension    Morbid obesity with BMI of 60.0-69.9, adult (HCC)    Restless leg syndrome     SURGICAL HISTORY: Past Surgical History:  Procedure Laterality Date   ABDOMINAL HYSTERECTOMY     CARDIOVERSION N/A 12/28/2023   Procedure: CARDIOVERSION;  Surgeon: Darliss Rogue, MD;  Location: [facility] ORS;  Service: Cardiovascular;  Laterality: N/A;   CATARACT EXTRACTION W/PHACO Left 04/13/2020   Procedure: CATARACT EXTRACTION PHACO AND INTRAOCULAR LENS PLACEMENT (IOC);  Surgeon: Ferol Rogue, MD;  Location: [facility] ORS;  Service: Ophthalmology;  Laterality: Left;  US  00:36.0 CDE 5.95 Fluid Pack lot # [id number]   HYSTEROSCOPY WITH D & C N/A 07/14/2017   Procedure: DILATATION AND CURETTAGE /HYSTEROSCOPY;  Surgeon: Arloa Lamar SQUIBB, MD;  Location: [facility] ORS;  Service: Gynecology;  Laterality: N/A;   IR IMAGING GUIDED PORT INSERTION  12/17/2023   POLYPECTOMY  2015   TEE WITHOUT CARDIOVERSION N/A 12/28/2023   Procedure: ECHOCARDIOGRAM, TRANSESOPHAGEAL;  Surgeon: Darliss Rogue, MD;  Location: [facility] ORS;  Service: Cardiovascular;  Laterality: N/A;    SOCIAL HISTORY: Social History   Socioeconomic History   Marital status: Single    Spouse name: Not on file   Number of children: Not on file   Years of education: Not on file   Highest education level: Not on file  Occupational History   Not on file  Tobacco Use   Smoking status: Never   Smokeless tobacco: Never  Vaping Use   Vaping status: Never Used  Substance and Sexual Activity   Alcohol use: No   Drug use: No   Sexual activity: Never    Birth  control/protection: None  Other Topics Concern   Not on file  Social History Narrative   Not on file   Social Drivers of Health   Financial Resource Strain: Low Risk  (05/11/2024)   [facility]   Overall Financial Resource Strain (CARDIA)    Difficulty of Paying Living Expenses: Not hard at all  Food Insecurity: No Food Insecurity (05/11/2024)   Received from [facility]   Hunger Vital Sign    Within the past 12 months, you worried that your food would run out before you got the money to buy more.: Never true    Within the past 12 months, the food you bought just didn't last and you didn't have money to get more.: Never true  Transportation Needs: No Transportation Needs (05/11/2024)   Received from [facility]    In the past 12 months, has lack of transportation kept you from medical appointments or from getting medications?: No    Lack of Transportation (Non-Medical): No  Physical Activity: Not on file  Stress: Not on file  Social Connections: Socially Isolated (12/25/2023)   Social Connection and Isolation Panel    Frequency of Communication with Friends and Family: More than three times a week  Frequency of Social Gatherings with Friends and Family: More than three times a week    Attends Religious Services: Never    Database Administrator or Organizations: No    Attends Banker Meetings: Never    Marital Status: Never married  Intimate Partner Violence: Patient Declined (01/30/2024)   Humiliation, Afraid, Rape, and Kick questionnaire    Fear of Current or Ex-Partner: Patient declined    Emotionally Abused: Patient declined    Physically Abused: Patient declined    Sexually Abused: Patient declined    FAMILY HISTORY: Family History  Problem Relation Age of Onset   Breast cancer Mother 38   Diabetes Mother    Hypertension Mother    Ovarian cancer Paternal Aunt         ?   Diabetes Father    Hypertension Father     ALLERGIES:  is allergic to peanut-containing drug products.  MEDICATIONS:  Current Outpatient Medications  Medication Sig Dispense Refill   acetaminophen  (TYLENOL  8 HOUR ARTHRITIS PAIN) 650 MG CR tablet Take 3 tablets (1,950 mg total) by mouth every 8 (eight) hours as needed for pain. Do not exceed 4000 mg total in a day.  Home med.     amLODipine  (NORVASC ) 10 MG tablet Take 10 mg by mouth daily.     apixaban  (ELIQUIS ) 5 MG TABS tablet Take 1 tablet (5 mg total) by mouth 2 (two) times daily. 60 tablet 1   azelastine  (ASTELIN ) 0.1 % nasal spray Place 1 spray into both nostrils 2 (two) times daily.     baclofen  (LIORESAL ) 10 MG tablet Take 10 mg by mouth 3 (three) times daily.     diphenoxylate -atropine  (LOMOTIL ) 2.5-0.025 MG tablet Take 2 tablets by mouth every 6 (six) hours as needed (for diarrhea unrelieved by imodium). 90 tablet 0   EPINEPHrine  0.3 mg/0.3 mL IJ SOAJ injection Inject 0.3 mg into the muscle as needed for anaphylaxis. 1 each 0   fluticasone  (FLONASE ) 50 MCG/ACT nasal spray Place 1 spray into both nostrils daily. 9.9 mL 0   fluticasone  furoate-vilanterol (BREO ELLIPTA ) 100-25 MCG/ACT AEPB Inhale 1 puff into the lungs daily. 28 each 1   furosemide  (LASIX ) 40 MG tablet Take 40 mg by mouth daily.     gabapentin  (NEURONTIN ) 300 MG capsule Take 1 capsule (300 mg total) by mouth 2 (two) times daily. 60 capsule 0   ipratropium-albuterol  (DUONEB) 0.5-2.5 (3) MG/3ML SOLN Take 3 mLs by nebulization every 4 (four) hours as needed (wheezing / shortness of breath). 360 mL 1   levocetirizine (XYZAL ) 5 MG tablet Take 5 mg by mouth daily.     lidocaine -prilocaine  (EMLA ) cream Apply 1 Application topically as needed. Apply to port and cover with saran wrap 1-2 hours prior to port access 30 g 1   loperamide (IMODIUM) 2 MG capsule Take 1 capsule (2 mg total) by mouth See admin instructions. Initial: 4 mg with onset of diarrhea, then 2 mg every 2  hours, or after each unformed stool. Maximum: 16 mg/day 60 capsule 2   magic mouthwash (lidocaine , diphenhydrAMINE , alum & mag hydroxide) suspension Swish and swallow 5 mLs 4 (four) times daily as needed for mouth pain. 360 mL 1   metFORMIN  (GLUCOPHAGE ) 1000 MG tablet Take 1,000 mg by mouth 2 (two) times daily.     metoprolol  tartrate (LOPRESSOR ) 25 MG tablet Take 1 tablet (25 mg total) by mouth 2 (two) times daily. 60 tablet 0   morphine  (MS CONTIN ) 15 MG 12 hr  tablet Take 1 tablet (15 mg total) by mouth every 12 (twelve) hours. 60 tablet 0   ondansetron  (ZOFRAN -ODT) 8 MG disintegrating tablet Take 1 tablet (8 mg total) by mouth every 8 (eight) hours as needed for nausea or vomiting. Do not take within 72 hours of chemotherapy. 60 tablet 3   oxyCODONE  (OXY IR/ROXICODONE ) 5 MG immediate release tablet Take 1 tablet (5 mg total) by mouth every 4 (four) hours as needed for moderate pain (pain score 4-6) or severe pain (pain score 7-10). 90 tablet 0   potassium chloride  (KLOR-CON  M) 10 MEQ tablet Take 1 tablet (10 mEq total) by mouth daily. 30 tablet 1   rOPINIRole  (REQUIP ) 2 MG tablet Take 2 mg by mouth in the morning and at bedtime.      spironolactone  (ALDACTONE ) 50 MG tablet Take 1 tablet (50 mg total) by mouth daily. 30 tablet 1   traZODone  (DESYREL ) 50 MG tablet Take 0.5-1 tablets (25-50 mg total) by mouth at bedtime as needed for sleep. 30 tablet 2   VENTOLIN  HFA 108 (90 Base) MCG/ACT inhaler Inhale 2 puffs into the lungs every 4 (four) hours as needed for wheezing or shortness of breath. 18 g 1   dexamethasone  (DECADRON ) 4 MG tablet Take 1 tablet (4 mg total) by mouth daily. Start the day after irinotecan chemotherapy for 2 days. Take with food. 30 tablet 1   naloxone  (NARCAN ) nasal spray 4 mg/0.1 mL SPRAY 1 SPRAY INTO ONE NOSTRIL AS DIRECTED FOR OPIOID OVERDOSE (TURN PERSON ON SIDE AFTER DOSE. IF NO RESPONSE IN 2-3 MINUTES OR PERSON RESPONDS BUT RELAPSES, REPEAT USING A NEW SPRAY DEVICE AND SPRAY  INTO THE OTHER NOSTRIL. CALL 911 AFTER USE.) * EMERGENCY USE ONLY * (Patient not taking: Reported on 06/16/2024) 1 each 0   prochlorperazine  (COMPAZINE ) 10 MG tablet Take 1 tablet (10 mg total) by mouth every 6 (six) hours as needed for nausea or vomiting. 30 tablet 1   No current facility-administered medications for this visit.    Review of Systems  Constitutional:  Positive for fatigue. Negative for appetite change, chills and fever.  HENT:   Negative for hearing loss and voice change.   Eyes:  Negative for eye problems.  Respiratory:  Negative for chest tightness, cough and shortness of breath.   Cardiovascular:  Positive for leg swelling. Negative for chest pain.  Gastrointestinal:  Positive for nausea. Negative for abdominal distention, abdominal pain and blood in stool.  Endocrine: Negative for hot flashes.  Genitourinary:  Negative for difficulty urinating and frequency.   Musculoskeletal:  Negative for arthralgias.  Skin:  Negative for itching and rash.  Neurological:  Negative for extremity weakness.  Hematological:  Negative for adenopathy.  Psychiatric/Behavioral:  Negative for confusion.      PHYSICAL EXAMINATION: ECOG PERFORMANCE STATUS: 1 - Symptomatic but completely ambulatory  Vitals:   06/16/24 0846  BP: 120/78  Pulse: 74  Resp: 18  Temp: 97.8 F (36.6 C)   Filed Weights   06/16/24 0846  Weight: 230 lb (104.3 kg)     Physical Exam Constitutional:      General: She is not in acute distress.    Appearance: She is obese. She is not diaphoretic.  HENT:     Head: Normocephalic and atraumatic.  Eyes:     General: No scleral icterus. Cardiovascular:     Rate and Rhythm: Normal rate and regular rhythm.  Pulmonary:     Effort: Pulmonary effort is normal. No respiratory distress.  Breath sounds: No wheezing or rales.     Comments: Decreased breath sound bilaterally Abdominal:     General: Bowel sounds are normal. There is no distension.      Palpations: Abdomen is soft.  Musculoskeletal:        General: Swelling present. Normal range of motion.     Cervical back: Normal range of motion and neck supple.     Right lower leg: Edema present.     Left lower leg: Edema present.  Skin:    General: Skin is warm and dry.     Findings: No erythema.  Neurological:     Mental Status: She is alert and oriented to person, place, and time. Mental status is at baseline.     Motor: No abnormal muscle tone.  Psychiatric:        Mood and Affect: Mood and affect normal.      LABORATORY DATA:  I have reviewed the data as listed    Latest Ref Rng & Units 06/16/2024    8:32 AM 06/02/2024    8:34 AM 05/19/2024    8:26 AM  CBC  WBC 4.0 - 10.5 K/uL 7.8  7.9  6.5   Hemoglobin 12.0 - 15.0 g/dL 87.5  88.2  88.2   Hematocrit 36.0 - 46.0 % 37.1  34.7  34.7   Platelets 150 - 400 K/uL 175  223  288       Latest Ref Rng & Units 06/16/2024    8:32 AM 06/02/2024    8:34 AM 05/19/2024    8:26 AM  CMP  Glucose 70 - 99 mg/dL 845  841  860   BUN 8 - 23 mg/dL 12  15  13    Creatinine 0.44 - 1.00 mg/dL 9.14  9.04  9.20   Sodium 135 - 145 mmol/L 134  138  137   Potassium 3.5 - 5.1 mmol/L 3.4  3.5  3.4   Chloride 98 - 111 mmol/L 99  103  103   CO2 22 - 32 mmol/L 23  25  24    Calcium  8.9 - 10.3 mg/dL 9.1  8.9  9.0   Total Protein 6.5 - 8.1 g/dL 8.2  8.0  7.8   Total Bilirubin 0.0 - 1.2 mg/dL 1.3  1.2  1.1   Alkaline Phos 38 - 126 U/L 190  190  169   AST 15 - 41 U/L 29  24  25    ALT 0 - 44 U/L 17  18  16       RADIOGRAPHIC STUDIES: I have personally reviewed the radiological images as listed and agreed with the findings in the report. CT ABDOMEN PELVIS W CONTRAST Result Date: 06/09/2024 CLINICAL DATA:  Follow-up of malignant neoplasm of pancreas. * Tracking Code: BO * EXAM: CT ABDOMEN AND PELVIS WITH CONTRAST TECHNIQUE: Multidetector CT imaging of the abdomen and pelvis was performed using the standard protocol following bolus administration of  intravenous contrast. RADIATION DOSE REDUCTION: This exam was performed according to the departmental dose-optimization program which includes automated exposure control, adjustment of the mA and/or kV according to patient size and/or use of iterative reconstruction technique. CONTRAST:  OMNIPAQUE  IOHEXOL  300 MG/ML  SOLN COMPARISON:  12/25/2023 FINDINGS: Lower chest: Left base scarring. The left lower lobe cavitary nodule has resolved. Mild cardiomegaly, without pericardial or pleural effusion. Hepatobiliary: Bilateral hepatic metastasis. These are progressive. Example mass straddling segments 2 and 4A measures 3.2 x 3.0 cm on 24/2 versus 2.2 x 1.8 cm on the  prior exam (when remeasured). Inferior right hepatic lobe subcapsular lesion measures 2.6 x 2.2 cm on 35/2 versus 1.3 x 1.5 cm on the prior exam (when remeasured). Cholecystectomy, without biliary ductal dilatation. Pancreas: Infiltrative pancreatic body and tail mass measures 7.2 x 4.3 cm on image 26/2 versus 7.5 x 4.2 cm on the prior, suggesting stability. Mild peripancreatic edema is similar. Spleen: Subcapsular hypoattenuating splenic lesions are increased. Example lesion medially measures 3.0 cm on 10/02 and is new. A more rounded anterior splenic lesion measures 2.2 cm on 10/02 versus 2.4 cm on the prior exam (when remeasured). Adrenals/Urinary Tract: Normal adrenal glands. Normal kidneys, without hydronephrosis. Normal urinary bladder. Stomach/Bowel: Normal stomach, without wall thickening. Moderate stool within the rectum. Normal small bowel. Vascular/Lymphatic: Aortic atherosclerosis. 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 Patent portal vein and SMV. No abdominopelvic adenopathy. Reproductive: Hysterectomy.  No adnexal mass. Other: No free pelvic fluid. Trace perihepatic ascites is new. No evidence of omental or peritoneal disease. Musculoskeletal: Advanced degenerative changes of both hips and sacroiliac  joints. Lumbosacral spondylosis. IMPRESSION: 1. Similar size of infiltrative pancreatic body/tail primary. Similar nonspecific edema in the anterior pararenal space. Cannot exclude superimposed pancreatitis. 2. Moderate progression of hepatic metastasis. 3. New hypoattenuating pancreatic lesions. Given subcapsular position and splenic vein involvement, infarcts are favored. Cannot exclude concurrent small volume metastasis. 4. Resolved left lower lobe cavitary nodule. 5. New trace perihepatic ascites. 6. Rectal stool volume suggests constipation. 7.  Aortic Atherosclerosis (ICD10-I70.0). Electronically Signed   By: Rockey Kilts M.D.   On: 06/09/2024 13:17

## 2024-06-16 NOTE — Assessment & Plan Note (Signed)
 Continue Eliquis 5mg  BID

## 2024-06-16 NOTE — Assessment & Plan Note (Signed)
 Chemotherapy plan as listed above

## 2024-06-16 NOTE — Assessment & Plan Note (Signed)
 Recommend gabapentin  100mg  TID PRN.

## 2024-06-16 NOTE — Assessment & Plan Note (Addendum)
 stage IV pancreatic cancer with liver metastasis was discussed with patient.  Other differential diagnosis include GI tract adenocarcinoma, cholangiocarcinoma etc. Baseline CEA is elevated.  CA 19-9 is normal. CT and MRI imaging is did not show any GI primary.I recommend a PET scan for further evaluation- patient rescheduled appt initially and later on PET scan was not done due to admission. I offered to call her daughter to give her updates and patient declines.   Labs are reviewed and discussed with patient. Patient did not tolerate Gemciabine and Abraxane  well.  Currently on dose reduced monotherapy Gemcitaibine 900mg /m2 1 week on 1 week off  CT showed moderate liver metastatic disease progression.   Patient desire further treatments. Discussed about option of switching to 5-FU and liposomal irinotecan, given her PS, will dose reduce. Rationale and side effects were reviewed with patient.  She agrees with the plan. Will arrange her to to treatment early next week. I discussed the detail instructions of antiemetics and imodium.  She will follow up 1 week after treatment for tolerability evaluation.   Follow up with palliative care service.

## 2024-06-16 NOTE — Progress Notes (Signed)
 DISCONTINUE ON PATHWAY REGIMEN - Pancreatic Adenocarcinoma     A cycle is every 28 days:     Nab-paclitaxel  (protein bound)      Gemcitabine    **Always confirm dose/schedule in your pharmacy ordering system**  PRIOR TREATMENT: PANOS51: Nab-Paclitaxel  (Abraxane ) 125 mg/m2 D1, 8, 15 + Gemcitabine  1,000 mg/m2 D1, 8, 15 q28 Days Until Progression or Toxicity  START ON PATHWAY REGIMEN - Pancreatic Adenocarcinoma     A cycle is every 14 days:     Liposomal irinotecan      Leucovorin      Fluorouracil   **Always confirm dose/schedule in your pharmacy ordering system**  Patient Characteristics: Metastatic Disease, Second Line, MSS/pMMR or MSI Unknown, Gemcitabine -Based Therapy First Line Therapeutic Status: Metastatic Disease Line of Therapy: Second Line Microsatellite/Mismatch Repair Status: Unknown Intent of Therapy: Non-Curative / Palliative Intent, Discussed with Patient

## 2024-06-16 NOTE — Progress Notes (Signed)
 Pt here for follow up. No new concerns voiced.

## 2024-06-16 NOTE — Assessment & Plan Note (Signed)
 She is not taking MS contin  oxycodone  5 mg every 4-6 hours as needed -  .

## 2024-06-16 NOTE — Telephone Encounter (Signed)
 [facility] NGS testing (xR+xT) requested on liver specimen [id number] collected on 11/09/23.   Financial assistance application completed and approved. Patient's out-of-pocket cost will be $0

## 2024-06-16 NOTE — Assessment & Plan Note (Signed)
 Atrial flutter: with RVR. S/p TEE & cardioverison on 12/28/23  follow-up with cardiology.  Continue metoprolol  and Eliquis 

## 2024-06-16 NOTE — Assessment & Plan Note (Signed)
Recommend potassium 7meq daily

## 2024-06-17 ENCOUNTER — Encounter: Payer: Self-pay | Admitting: Oncology

## 2024-06-20 ENCOUNTER — Other Ambulatory Visit: Payer: Self-pay | Admitting: Oncology

## 2024-06-20 ENCOUNTER — Encounter: Payer: Self-pay | Admitting: Oncology

## 2024-06-20 ENCOUNTER — Inpatient Hospital Stay: Attending: Oncology

## 2024-06-20 DIAGNOSIS — Z5111 Encounter for antineoplastic chemotherapy: Secondary | ICD-10-CM | POA: Insufficient documentation

## 2024-06-20 DIAGNOSIS — C787 Secondary malignant neoplasm of liver and intrahepatic bile duct: Secondary | ICD-10-CM | POA: Insufficient documentation

## 2024-06-20 DIAGNOSIS — C259 Malignant neoplasm of pancreas, unspecified: Secondary | ICD-10-CM | POA: Insufficient documentation

## 2024-06-20 NOTE — Telephone Encounter (Signed)
 Pt wanted to move her tx on 11/5 appt to a later time.   I was able to move it to 12:15 and push back the d3 pump dc to 4pm. Pt confirmed new times for both appts

## 2024-06-21 ENCOUNTER — Encounter: Payer: Self-pay | Admitting: Oncology

## 2024-06-22 ENCOUNTER — Inpatient Hospital Stay

## 2024-06-22 ENCOUNTER — Encounter: Payer: Self-pay | Admitting: Oncology

## 2024-06-22 VITALS — BP 149/89 | HR 55 | Temp 97.3°F | Resp 18 | Wt 234.0 lb

## 2024-06-22 DIAGNOSIS — Z5111 Encounter for antineoplastic chemotherapy: Secondary | ICD-10-CM | POA: Diagnosis present

## 2024-06-22 DIAGNOSIS — C787 Secondary malignant neoplasm of liver and intrahepatic bile duct: Secondary | ICD-10-CM | POA: Diagnosis not present

## 2024-06-22 DIAGNOSIS — C259 Malignant neoplasm of pancreas, unspecified: Secondary | ICD-10-CM | POA: Diagnosis not present

## 2024-06-22 MED ORDER — SODIUM CHLORIDE 0.9 % IV SOLN
INTRAVENOUS | Status: DC
  Filled 2024-06-22: qty 250

## 2024-06-22 MED ORDER — DEXAMETHASONE SOD PHOSPHATE PF 10 MG/ML IJ SOLN
10.0000 mg | Freq: Once | INTRAMUSCULAR | Status: AC
  Administered 2024-06-22: 10 mg via INTRAVENOUS

## 2024-06-22 MED ORDER — SODIUM CHLORIDE 0.9 % IV SOLN
35.0000 mg/m2 | Freq: Once | INTRAVENOUS | Status: AC
  Administered 2024-06-22: 73.1 mg via INTRAVENOUS
  Filled 2024-06-22: qty 17

## 2024-06-22 MED ORDER — SODIUM CHLORIDE 0.9 % IV SOLN
400.0000 mg/m2 | Freq: Once | INTRAVENOUS | Status: AC
  Administered 2024-06-22: 848 mg via INTRAVENOUS
  Filled 2024-06-22: qty 42.4

## 2024-06-22 MED ORDER — SODIUM CHLORIDE 0.9 % IV SOLN
2400.0000 mg/m2 | INTRAVENOUS | Status: DC
  Administered 2024-06-22: 5000 mg via INTRAVENOUS
  Filled 2024-06-22: qty 100

## 2024-06-22 MED ORDER — PALONOSETRON HCL INJECTION 0.25 MG/5ML
0.2500 mg | Freq: Once | INTRAVENOUS | Status: AC
  Administered 2024-06-22: 0.25 mg via INTRAVENOUS
  Filled 2024-06-22: qty 5

## 2024-06-24 ENCOUNTER — Inpatient Hospital Stay

## 2024-06-26 ENCOUNTER — Emergency Department

## 2024-06-26 ENCOUNTER — Other Ambulatory Visit: Payer: Self-pay

## 2024-06-26 ENCOUNTER — Encounter
Admission: EM | Disposition: E | Payer: Self-pay | Source: Home / Self Care | Attending: Student in an Organized Health Care Education/Training Program

## 2024-06-26 ENCOUNTER — Inpatient Hospital Stay

## 2024-06-26 ENCOUNTER — Inpatient Hospital Stay
Admission: EM | Admit: 2024-06-26 | Discharge: 2024-07-18 | DRG: 359 | Disposition: E | Attending: Student | Admitting: Student

## 2024-06-26 DIAGNOSIS — R58 Hemorrhage, not elsewhere classified: Secondary | ICD-10-CM | POA: Diagnosis not present

## 2024-06-26 DIAGNOSIS — Z7901 Long term (current) use of anticoagulants: Secondary | ICD-10-CM | POA: Diagnosis not present

## 2024-06-26 DIAGNOSIS — I110 Hypertensive heart disease with heart failure: Secondary | ICD-10-CM | POA: Diagnosis present

## 2024-06-26 DIAGNOSIS — Z7902 Long term (current) use of antithrombotics/antiplatelets: Secondary | ICD-10-CM | POA: Diagnosis not present

## 2024-06-26 DIAGNOSIS — J9602 Acute respiratory failure with hypercapnia: Secondary | ICD-10-CM | POA: Diagnosis not present

## 2024-06-26 DIAGNOSIS — G8194 Hemiplegia, unspecified affecting left nondominant side: Secondary | ICD-10-CM | POA: Diagnosis not present

## 2024-06-26 DIAGNOSIS — D500 Iron deficiency anemia secondary to blood loss (chronic): Secondary | ICD-10-CM

## 2024-06-26 DIAGNOSIS — I213 ST elevation (STEMI) myocardial infarction of unspecified site: Secondary | ICD-10-CM | POA: Diagnosis not present

## 2024-06-26 DIAGNOSIS — R578 Other shock: Secondary | ICD-10-CM | POA: Diagnosis not present

## 2024-06-26 DIAGNOSIS — I5023 Acute on chronic systolic (congestive) heart failure: Secondary | ICD-10-CM | POA: Diagnosis not present

## 2024-06-26 DIAGNOSIS — I6310 Cerebral infarction due to embolism of unspecified precerebral artery: Secondary | ICD-10-CM | POA: Diagnosis not present

## 2024-06-26 DIAGNOSIS — J690 Pneumonitis due to inhalation of food and vomit: Secondary | ICD-10-CM | POA: Diagnosis not present

## 2024-06-26 DIAGNOSIS — Z66 Do not resuscitate: Secondary | ICD-10-CM | POA: Diagnosis present

## 2024-06-26 DIAGNOSIS — J150 Pneumonia due to Klebsiella pneumoniae: Secondary | ICD-10-CM | POA: Diagnosis not present

## 2024-06-26 DIAGNOSIS — I2119 ST elevation (STEMI) myocardial infarction involving other coronary artery of inferior wall: Principal | ICD-10-CM | POA: Diagnosis present

## 2024-06-26 DIAGNOSIS — I2111 ST elevation (STEMI) myocardial infarction involving right coronary artery: Secondary | ICD-10-CM | POA: Diagnosis not present

## 2024-06-26 DIAGNOSIS — D696 Thrombocytopenia, unspecified: Secondary | ICD-10-CM | POA: Diagnosis present

## 2024-06-26 DIAGNOSIS — N179 Acute kidney failure, unspecified: Secondary | ICD-10-CM | POA: Diagnosis not present

## 2024-06-26 DIAGNOSIS — Z515 Encounter for palliative care: Secondary | ICD-10-CM

## 2024-06-26 DIAGNOSIS — E872 Acidosis, unspecified: Secondary | ICD-10-CM | POA: Diagnosis present

## 2024-06-26 DIAGNOSIS — Z604 Social exclusion and rejection: Secondary | ICD-10-CM | POA: Diagnosis present

## 2024-06-26 DIAGNOSIS — D62 Acute posthemorrhagic anemia: Secondary | ICD-10-CM | POA: Diagnosis not present

## 2024-06-26 DIAGNOSIS — Z803 Family history of malignant neoplasm of breast: Secondary | ICD-10-CM

## 2024-06-26 DIAGNOSIS — Z6841 Body Mass Index (BMI) 40.0 and over, adult: Secondary | ICD-10-CM

## 2024-06-26 DIAGNOSIS — Z9221 Personal history of antineoplastic chemotherapy: Secondary | ICD-10-CM

## 2024-06-26 DIAGNOSIS — J9601 Acute respiratory failure with hypoxia: Secondary | ICD-10-CM | POA: Diagnosis not present

## 2024-06-26 DIAGNOSIS — Y838 Other surgical procedures as the cause of abnormal reaction of the patient, or of later complication, without mention of misadventure at the time of the procedure: Secondary | ICD-10-CM | POA: Diagnosis present

## 2024-06-26 DIAGNOSIS — Z8249 Family history of ischemic heart disease and other diseases of the circulatory system: Secondary | ICD-10-CM

## 2024-06-26 DIAGNOSIS — T8119XA Other postprocedural shock, initial encounter: Secondary | ICD-10-CM | POA: Diagnosis not present

## 2024-06-26 DIAGNOSIS — I63511 Cerebral infarction due to unspecified occlusion or stenosis of right middle cerebral artery: Secondary | ICD-10-CM | POA: Diagnosis not present

## 2024-06-26 DIAGNOSIS — J440 Chronic obstructive pulmonary disease with acute lower respiratory infection: Secondary | ICD-10-CM | POA: Diagnosis not present

## 2024-06-26 DIAGNOSIS — I4891 Unspecified atrial fibrillation: Secondary | ICD-10-CM | POA: Diagnosis not present

## 2024-06-26 DIAGNOSIS — M159 Polyosteoarthritis, unspecified: Secondary | ICD-10-CM | POA: Diagnosis present

## 2024-06-26 DIAGNOSIS — I2510 Atherosclerotic heart disease of native coronary artery without angina pectoris: Secondary | ICD-10-CM | POA: Diagnosis present

## 2024-06-26 DIAGNOSIS — C259 Malignant neoplasm of pancreas, unspecified: Secondary | ICD-10-CM | POA: Diagnosis present

## 2024-06-26 DIAGNOSIS — E66813 Obesity, class 3: Secondary | ICD-10-CM | POA: Diagnosis present

## 2024-06-26 DIAGNOSIS — E118 Type 2 diabetes mellitus with unspecified complications: Secondary | ICD-10-CM | POA: Diagnosis not present

## 2024-06-26 DIAGNOSIS — Z7984 Long term (current) use of oral hypoglycemic drugs: Secondary | ICD-10-CM

## 2024-06-26 DIAGNOSIS — K219 Gastro-esophageal reflux disease without esophagitis: Secondary | ICD-10-CM | POA: Diagnosis present

## 2024-06-26 DIAGNOSIS — Z91010 Allergy to peanuts: Secondary | ICD-10-CM

## 2024-06-26 DIAGNOSIS — R000 Tachycardia, unspecified: Secondary | ICD-10-CM | POA: Diagnosis present

## 2024-06-26 DIAGNOSIS — C787 Secondary malignant neoplasm of liver and intrahepatic bile duct: Secondary | ICD-10-CM | POA: Diagnosis present

## 2024-06-26 DIAGNOSIS — G2581 Restless legs syndrome: Secondary | ICD-10-CM | POA: Diagnosis present

## 2024-06-26 DIAGNOSIS — I480 Paroxysmal atrial fibrillation: Secondary | ICD-10-CM | POA: Diagnosis not present

## 2024-06-26 DIAGNOSIS — I97630 Postprocedural hematoma of a circulatory system organ or structure following a cardiac catheterization: Secondary | ICD-10-CM | POA: Diagnosis not present

## 2024-06-26 DIAGNOSIS — E876 Hypokalemia: Secondary | ICD-10-CM | POA: Diagnosis present

## 2024-06-26 DIAGNOSIS — Z833 Family history of diabetes mellitus: Secondary | ICD-10-CM

## 2024-06-26 DIAGNOSIS — G928 Other toxic encephalopathy: Secondary | ICD-10-CM | POA: Diagnosis not present

## 2024-06-26 DIAGNOSIS — G929 Unspecified toxic encephalopathy: Secondary | ICD-10-CM | POA: Diagnosis not present

## 2024-06-26 DIAGNOSIS — R571 Hypovolemic shock: Secondary | ICD-10-CM | POA: Diagnosis not present

## 2024-06-26 DIAGNOSIS — Z8041 Family history of malignant neoplasm of ovary: Secondary | ICD-10-CM

## 2024-06-26 DIAGNOSIS — I509 Heart failure, unspecified: Secondary | ICD-10-CM | POA: Diagnosis not present

## 2024-06-26 DIAGNOSIS — Z79899 Other long term (current) drug therapy: Secondary | ICD-10-CM

## 2024-06-26 LAB — BLOOD GAS, ARTERIAL
Acid-base deficit: 7.7 mmol/L — ABNORMAL HIGH (ref 0.0–2.0)
Bicarbonate: 16.4 mmol/L — ABNORMAL LOW (ref 20.0–28.0)
O2 Saturation: 100 %
Patient temperature: 37
pCO2 arterial: 29 mmHg — ABNORMAL LOW (ref 32–48)
pH, Arterial: 7.36 (ref 7.35–7.45)
pO2, Arterial: 283 mmHg — ABNORMAL HIGH (ref 83–108)

## 2024-06-26 LAB — CBC WITH DIFFERENTIAL/PLATELET
Abs Immature Granulocytes: 0.05 K/uL (ref 0.00–0.07)
Abs Immature Granulocytes: 0.12 K/uL — ABNORMAL HIGH (ref 0.00–0.07)
Basophils Absolute: 0 K/uL (ref 0.0–0.1)
Basophils Absolute: 0 K/uL (ref 0.0–0.1)
Basophils Relative: 0 %
Basophils Relative: 0 %
Eosinophils Absolute: 0.2 K/uL (ref 0.0–0.5)
Eosinophils Absolute: 0.1 K/uL (ref 0.0–0.5)
Eosinophils Relative: 2 %
Eosinophils Relative: 1 %
HCT: 36.3 % (ref 36.0–46.0)
HCT: 25.1 % — ABNORMAL LOW (ref 36.0–46.0)
Hemoglobin: 12 g/dL (ref 12.0–15.0)
Hemoglobin: 8.2 g/dL — ABNORMAL LOW (ref 12.0–15.0)
Immature Granulocytes: 1 %
Immature Granulocytes: 1 %
Lymphocytes Relative: 12 %
Lymphocytes Relative: 11 %
Lymphs Abs: 1.1 K/uL (ref 0.7–4.0)
Lymphs Abs: 1.4 K/uL (ref 0.7–4.0)
MCH: 31.3 pg (ref 26.0–34.0)
MCH: 31.4 pg (ref 26.0–34.0)
MCHC: 33.1 g/dL (ref 30.0–36.0)
MCHC: 32.7 g/dL (ref 30.0–36.0)
MCV: 94.5 fL (ref 80.0–100.0)
MCV: 96.2 fL (ref 80.0–100.0)
Monocytes Absolute: 0.4 K/uL (ref 0.1–1.0)
Monocytes Absolute: 0.5 K/uL (ref 0.1–1.0)
Monocytes Relative: 5 %
Monocytes Relative: 4 %
Neutro Abs: 7.6 K/uL (ref 1.7–7.7)
Neutro Abs: 10.5 K/uL — ABNORMAL HIGH (ref 1.7–7.7)
Neutrophils Relative %: 80 %
Neutrophils Relative %: 83 %
Platelets: 133 K/uL — ABNORMAL LOW (ref 150–400)
Platelets: 171 K/uL (ref 150–400)
RBC: 3.84 MIL/uL — ABNORMAL LOW (ref 3.87–5.11)
RBC: 2.61 MIL/uL — ABNORMAL LOW (ref 3.87–5.11)
RDW: 15.7 % — ABNORMAL HIGH (ref 11.5–15.5)
RDW: 15.9 % — ABNORMAL HIGH (ref 11.5–15.5)
WBC: 9.4 K/uL (ref 4.0–10.5)
WBC: 12.7 K/uL — ABNORMAL HIGH (ref 4.0–10.5)
nRBC: 0 % (ref 0.0–0.2)
nRBC: 0.2 % (ref 0.0–0.2)

## 2024-06-26 LAB — HEMOGLOBIN A1C
Hgb A1c MFr Bld: 6.4 % — ABNORMAL HIGH (ref 4.8–5.6)
Mean Plasma Glucose: 136.98 mg/dL

## 2024-06-26 LAB — COMPREHENSIVE METABOLIC PANEL WITH GFR
ALT: 32 U/L (ref 0–44)
ALT: 25 U/L (ref 0–44)
AST: 42 U/L — ABNORMAL HIGH (ref 15–41)
AST: 39 U/L (ref 15–41)
Albumin: 3.5 g/dL (ref 3.5–5.0)
Albumin: 2.5 g/dL — ABNORMAL LOW (ref 3.5–5.0)
Alkaline Phosphatase: 222 U/L — ABNORMAL HIGH (ref 38–126)
Alkaline Phosphatase: 152 U/L — ABNORMAL HIGH (ref 38–126)
Anion gap: 14 (ref 5–15)
Anion gap: 16 — ABNORMAL HIGH (ref 5–15)
BUN: 19 mg/dL (ref 8–23)
BUN: 21 mg/dL (ref 8–23)
CO2: 23 mmol/L (ref 22–32)
CO2: 19 mmol/L — ABNORMAL LOW (ref 22–32)
Calcium: 8.5 mg/dL — ABNORMAL LOW (ref 8.9–10.3)
Calcium: 7.5 mg/dL — ABNORMAL LOW (ref 8.9–10.3)
Chloride: 95 mmol/L — ABNORMAL LOW (ref 98–111)
Chloride: 97 mmol/L — ABNORMAL LOW (ref 98–111)
Creatinine, Ser: 0.93 mg/dL (ref 0.44–1.00)
Creatinine, Ser: 0.99 mg/dL (ref 0.44–1.00)
GFR, Estimated: >60 mL/min (ref >60)
GFR, Estimated: >60 mL/min (ref >60)
Glucose, Bld: 463 mg/dL — ABNORMAL HIGH (ref 70–99)
Glucose, Bld: 541 mg/dL (ref 70–99)
Potassium: 3.4 mmol/L — ABNORMAL LOW (ref 3.5–5.1)
Potassium: 3.4 mmol/L — ABNORMAL LOW (ref 3.5–5.1)
Sodium: 132 mmol/L — ABNORMAL LOW (ref 135–145)
Sodium: 132 mmol/L — ABNORMAL LOW (ref 135–145)
Total Bilirubin: 1.7 mg/dL — ABNORMAL HIGH (ref 0.0–1.2)
Total Bilirubin: 1.5 mg/dL — ABNORMAL HIGH (ref 0.0–1.2)
Total Protein: 8.2 g/dL — ABNORMAL HIGH (ref 6.5–8.1)
Total Protein: 5.8 g/dL — ABNORMAL LOW (ref 6.5–8.1)

## 2024-06-26 LAB — LIPID PANEL
Cholesterol: 175 mg/dL (ref 0–200)
HDL: 48 mg/dL (ref >40)
LDL Cholesterol: 98 mg/dL (ref 0–99)
Total CHOL/HDL Ratio: 3.6 ratio
Triglycerides: 145 mg/dL (ref <150)
VLDL: 29 mg/dL (ref 0–40)

## 2024-06-26 LAB — PROTIME-INR
INR: 1.4 — ABNORMAL HIGH (ref 0.8–1.2)
INR: 4.1 (ref 0.8–1.2)
Prothrombin Time: 18.1 s — ABNORMAL HIGH (ref 11.4–15.2)
Prothrombin Time: 41.8 s — ABNORMAL HIGH (ref 11.4–15.2)

## 2024-06-26 LAB — GLUCOSE, CAPILLARY
Glucose-Capillary: 485 mg/dL — ABNORMAL HIGH (ref 70–99)
Glucose-Capillary: 506 mg/dL (ref 70–99)
Glucose-Capillary: 523 mg/dL (ref 70–99)
Glucose-Capillary: 526 mg/dL (ref 70–99)
Glucose-Capillary: 573 mg/dL (ref 70–99)

## 2024-06-26 LAB — TROPONIN I (HIGH SENSITIVITY)
Troponin I (High Sensitivity): 74 ng/L — ABNORMAL HIGH (ref <18)
Troponin I (High Sensitivity): 9572 ng/L (ref <18)

## 2024-06-26 LAB — LACTIC ACID, PLASMA
Lactic Acid, Venous: 4.8 mmol/L (ref 0.5–1.9)
Lactic Acid, Venous: 7 mmol/L (ref 0.5–1.9)

## 2024-06-26 LAB — BETA-HYDROXYBUTYRIC ACID: Beta-Hydroxybutyric Acid: 0.78 mmol/L — ABNORMAL HIGH (ref 0.05–0.27)

## 2024-06-26 LAB — APTT: aPTT: 29 s (ref 24–36)

## 2024-06-26 LAB — POCT ACTIVATED CLOTTING TIME
Activated Clotting Time: 423 s
Activated Clotting Time: 291 s

## 2024-06-26 LAB — MRSA NEXT GEN BY PCR, NASAL: MRSA by PCR Next Gen: NOT DETECTED

## 2024-06-26 LAB — MAGNESIUM: Magnesium: 2.2 mg/dL (ref 1.7–2.4)

## 2024-06-26 SURGERY — CORONARY/GRAFT ACUTE MI REVASCULARIZATION
Anesthesia: Moderate Sedation

## 2024-06-26 MED ORDER — SODIUM CHLORIDE 0.9% FLUSH: 10.0000 mL | Freq: Two times a day (BID) | INTRAVENOUS | Status: DC

## 2024-06-26 MED ORDER — VERAPAMIL HCL 2.5 MG/ML IV SOLN
INTRAVENOUS | Status: DC | PRN
  Administered 2024-06-26: 2.5 mg via INTRA_ARTERIAL

## 2024-06-26 MED ORDER — POTASSIUM CHLORIDE 10 MEQ/100ML IV SOLN
10.0000 meq | INTRAVENOUS | Status: AC
  Administered 2024-06-26 (×2): 10 meq via INTRAVENOUS
  Filled 2024-06-26 (×2): qty 100

## 2024-06-26 MED ORDER — SODIUM CHLORIDE 0.9% FLUSH: 3.0000 mL | INTRAVENOUS | Status: DC | PRN

## 2024-06-26 MED ORDER — HEPARIN (PORCINE) IN NACL 1000-0.9 UT/500ML-% IV SOLN
INTRAVENOUS | Status: AC
  Filled 2024-06-26: qty 1000

## 2024-06-26 MED ORDER — FENTANYL CITRATE (PF) 50 MCG/ML IJ SOSY: 25.0000 ug | PREFILLED_SYRINGE | Freq: Once | INTRAMUSCULAR | Status: AC

## 2024-06-26 MED ORDER — DEXTROSE 50 % IV SOLN
0.0000 mL | INTRAVENOUS | Status: DC | PRN
  Administered 2024-06-27 (×2): 50 mL via INTRAVENOUS
  Filled 2024-06-26: qty 50

## 2024-06-26 MED ORDER — SODIUM CHLORIDE 0.9% FLUSH
3.0000 mL | Freq: Two times a day (BID) | INTRAVENOUS | Status: DC
  Administered 2024-06-26 – 2024-07-01 (×10): 3 mL via INTRAVENOUS

## 2024-06-26 MED ORDER — HALOPERIDOL LACTATE 5 MG/ML IJ SOLN
5.0000 mg | Freq: Once | INTRAMUSCULAR | Status: AC
  Administered 2024-06-26: 5 mg via INTRAVENOUS
  Filled 2024-06-26: qty 1

## 2024-06-26 MED ORDER — BIVALIRUDIN TRIFLUOROACETATE 250 MG IV SOLR
INTRAVENOUS | Status: AC
  Filled 2024-06-26: qty 250

## 2024-06-26 MED ORDER — NOREPINEPHRINE 4 MG/250ML-% IV SOLN
0.0000 ug/min | INTRAVENOUS | Status: DC
  Administered 2024-06-26: 10 ug/min via INTRAVENOUS

## 2024-06-26 MED ORDER — PRASUGREL HCL 10 MG PO TABS
ORAL_TABLET | ORAL | Status: AC
  Filled 2024-06-26: qty 6

## 2024-06-26 MED ORDER — SODIUM CHLORIDE 0.9 % IV SOLN
INTRAVENOUS | Status: AC | PRN
  Administered 2024-06-26: 250 mL via INTRAVENOUS

## 2024-06-26 MED ORDER — FENTANYL CITRATE (PF) 50 MCG/ML IJ SOSY
PREFILLED_SYRINGE | INTRAMUSCULAR | Status: AC
  Administered 2024-06-26: 25 ug via INTRAVENOUS
  Filled 2024-06-26: qty 1

## 2024-06-26 MED ORDER — SODIUM CHLORIDE 0.9% FLUSH: 10.0000 mL | INTRAVENOUS | Status: DC | PRN

## 2024-06-26 MED ORDER — VERAPAMIL HCL 2.5 MG/ML IV SOLN
INTRAVENOUS | Status: AC
  Filled 2024-06-26: qty 2

## 2024-06-26 MED ORDER — MIDAZOLAM HCL 2 MG/2ML IJ SOLN
INTRAMUSCULAR | Status: AC
  Filled 2024-06-26: qty 2

## 2024-06-26 MED ORDER — NOREPINEPHRINE 4 MG/250ML-% IV SOLN
INTRAVENOUS | Status: AC
  Filled 2024-06-26: qty 250

## 2024-06-26 MED ORDER — SODIUM CHLORIDE 0.9 % IV SOLN
250.0000 mL | INTRAVENOUS | Status: AC | PRN
  Administered 2024-06-27 (×3): 250 mL via INTRAVENOUS

## 2024-06-26 MED ORDER — ASPIRIN 81 MG PO CHEW: 81.0000 mg | CHEWABLE_TABLET | Freq: Every day | ORAL | Status: DC

## 2024-06-26 MED ORDER — HEPARIN SODIUM (PORCINE) 5000 UNIT/ML IJ SOLN
4000.0000 [IU] | Freq: Once | INTRAMUSCULAR | Status: AC
  Administered 2024-06-26: 4000 [IU] via INTRAVENOUS

## 2024-06-26 MED ORDER — LACTATED RINGERS IV BOLUS
20.0000 mL/kg | Freq: Once | INTRAVENOUS | Status: AC
  Administered 2024-06-26: 2216 mL via INTRAVENOUS

## 2024-06-26 MED ORDER — FENTANYL CITRATE (PF) 100 MCG/2ML IJ SOLN
INTRAMUSCULAR | Status: DC | PRN
  Administered 2024-06-26: 25 ug via INTRAVENOUS
  Administered 2024-06-26: 50 ug via INTRAVENOUS
  Administered 2024-06-26 (×2): 12.5 ug via INTRAVENOUS

## 2024-06-26 MED ORDER — LORAZEPAM 2 MG/ML IJ SOLN
2.0000 mg | Freq: Once | INTRAMUSCULAR | Status: AC
  Administered 2024-06-26: 2 mg via INTRAVENOUS

## 2024-06-26 MED ORDER — DEXMEDETOMIDINE HCL IN NACL 400 MCG/100ML IV SOLN
0.0000 ug/kg/h | INTRAVENOUS | Status: DC
  Administered 2024-06-26: 0.4 ug/kg/h via INTRAVENOUS
  Administered 2024-06-27: 0.7 ug/kg/h via INTRAVENOUS
  Administered 2024-06-27: 0.8 ug/kg/h via INTRAVENOUS
  Administered 2024-06-27: 0.6 ug/kg/h via INTRAVENOUS
  Administered 2024-06-27 – 2024-06-28 (×2): 0.8 ug/kg/h via INTRAVENOUS
  Administered 2024-06-28: 0.5 ug/kg/h via INTRAVENOUS
  Administered 2024-06-28 – 2024-06-30 (×11): 0.8 ug/kg/h via INTRAVENOUS
  Administered 2024-07-01: 0.7 ug/kg/h via INTRAVENOUS
  Administered 2024-07-01 (×3): 0.8 ug/kg/h via INTRAVENOUS
  Filled 2024-06-26 (×22): qty 100

## 2024-06-26 MED ORDER — FENTANYL CITRATE (PF) 50 MCG/ML IJ SOSY
50.0000 ug | PREFILLED_SYRINGE | Freq: Once | INTRAMUSCULAR | Status: AC
  Administered 2024-06-26: 50 ug via INTRAVENOUS

## 2024-06-26 MED ORDER — PRASUGREL HCL 10 MG PO TABS
10.0000 mg | ORAL_TABLET | Freq: Every day | ORAL | Status: DC
  Filled 2024-06-26: qty 1

## 2024-06-26 MED ORDER — LIDOCAINE HCL (PF) 1 % IJ SOLN
INTRAMUSCULAR | Status: DC | PRN
  Administered 2024-06-26: 5 mL
  Administered 2024-06-26: 10 mL

## 2024-06-26 MED ORDER — MIDAZOLAM HCL (PF) 2 MG/2ML IJ SOLN
INTRAMUSCULAR | Status: DC | PRN
  Administered 2024-06-26: 1 mg via INTRAVENOUS
  Administered 2024-06-26 (×2): .5 mg via INTRAVENOUS
  Administered 2024-06-26: 1 mg via INTRAVENOUS

## 2024-06-26 MED ORDER — BIVALIRUDIN BOLUS VIA INFUSION - CUPID
INTRAVENOUS | Status: DC | PRN
  Administered 2024-06-26: 83.7 mg via INTRAVENOUS

## 2024-06-26 MED ORDER — ACETAMINOPHEN 325 MG PO TABS
650.0000 mg | ORAL_TABLET | ORAL | Status: DC | PRN
  Administered 2024-06-27 – 2024-06-28 (×2): 650 mg via ORAL
  Filled 2024-06-26 (×2): qty 2

## 2024-06-26 MED ORDER — HALOPERIDOL LACTATE 5 MG/ML IJ SOLN
INTRAMUSCULAR | Status: AC
  Filled 2024-06-26: qty 1

## 2024-06-26 MED ORDER — NITROGLYCERIN 0.4 MG SL SUBL
0.4000 mg | SUBLINGUAL_TABLET | SUBLINGUAL | Status: DC | PRN
  Administered 2024-06-26: 0.4 mg via SUBLINGUAL

## 2024-06-26 MED ORDER — SODIUM CHLORIDE 0.9 % IV SOLN
INTRAVENOUS | Status: DC
  Administered 2024-06-27: 10 mL/h via INTRAVENOUS

## 2024-06-26 MED ORDER — LORAZEPAM 2 MG/ML IJ SOLN
INTRAMUSCULAR | Status: AC
  Filled 2024-06-26: qty 1

## 2024-06-26 MED ORDER — BIVALIRUDIN TRIFLUOROACETATE 250 MG IV SOLR
INTRAVENOUS | Status: AC | PRN
  Administered 2024-06-26: 1.75 mg/kg/h via INTRAVENOUS

## 2024-06-26 MED ORDER — ONDANSETRON HCL 4 MG/2ML IJ SOLN: 4.0000 mg | Freq: Four times a day (QID) | INTRAMUSCULAR | Status: DC | PRN

## 2024-06-26 MED ORDER — HEPARIN (PORCINE) IN NACL 1000-0.9 UT/500ML-% IV SOLN
INTRAVENOUS | Status: DC | PRN
  Administered 2024-06-26: 1000 mL

## 2024-06-26 MED ORDER — FENTANYL CITRATE (PF) 50 MCG/ML IJ SOSY
PREFILLED_SYRINGE | INTRAMUSCULAR | Status: AC
  Filled 2024-06-26: qty 1

## 2024-06-26 MED ORDER — FREE WATER: 500.0000 mL | Freq: Once | Status: DC

## 2024-06-26 MED ORDER — HEPARIN SODIUM (PORCINE) 1000 UNIT/ML IJ SOLN
INTRAMUSCULAR | Status: AC
  Filled 2024-06-26: qty 10

## 2024-06-26 MED ORDER — PRASUGREL HCL 10 MG PO TABS
ORAL_TABLET | ORAL | Status: DC | PRN
  Administered 2024-06-26: 60 mg via ORAL

## 2024-06-26 MED ORDER — INSULIN REGULAR(HUMAN) IN NACL 100-0.9 UT/100ML-% IV SOLN
INTRAVENOUS | Status: DC
  Administered 2024-06-26: 10.5 [IU]/h via INTRAVENOUS
  Administered 2024-06-27: 40 [IU]/h via INTRAVENOUS
  Administered 2024-06-27: 35 [IU]/h via INTRAVENOUS
  Administered 2024-06-27: 36 [IU]/h via INTRAVENOUS
  Filled 2024-06-26 (×4): qty 100

## 2024-06-26 MED ORDER — LIDOCAINE HCL 1 % IJ SOLN
INTRAMUSCULAR | Status: AC
  Filled 2024-06-26: qty 20

## 2024-06-26 MED ORDER — DEXMEDETOMIDINE HCL IN NACL 400 MCG/100ML IV SOLN
INTRAVENOUS | Status: AC
  Filled 2024-06-26: qty 100

## 2024-06-26 MED ORDER — NOREPINEPHRINE 16 MG/250ML-% IV SOLN
0.0000 ug/min | INTRAVENOUS | Status: DC
  Administered 2024-06-26: 10 ug/min via INTRAVENOUS
  Administered 2024-06-27: 25 ug/min via INTRAVENOUS
  Administered 2024-06-27: 30 ug/min via INTRAVENOUS
  Administered 2024-06-28: 9 ug/min via INTRAVENOUS
  Administered 2024-06-28: 24 ug/min via INTRAVENOUS
  Filled 2024-06-26 (×6): qty 250

## 2024-06-26 MED ORDER — DEXTROSE IN LACTATED RINGERS 5 % IV SOLN: INTRAVENOUS | Status: DC

## 2024-06-26 MED ORDER — CHLORHEXIDINE GLUCONATE CLOTH 2 % EX PADS
6.0000 | MEDICATED_PAD | Freq: Every day | CUTANEOUS | Status: DC
  Administered 2024-06-26 – 2024-07-01 (×5): 6 via TOPICAL

## 2024-06-26 MED ORDER — LACTATED RINGERS IV SOLN: INTRAVENOUS | Status: DC

## 2024-06-26 MED ORDER — FENTANYL CITRATE (PF) 100 MCG/2ML IJ SOLN
INTRAMUSCULAR | Status: AC
  Filled 2024-06-26: qty 2

## 2024-06-26 MED ORDER — ORAL CARE MOUTH RINSE: 15.0000 mL | OROMUCOSAL | Status: DC | PRN

## 2024-06-26 SURGICAL SUPPLY — 27 items
BALLOON TREK RX 2.5X15 (BALLOONS) IMPLANT
CANISTER PENUMBRA ENGINE (MISCELLANEOUS) IMPLANT
CATH INDIGO CAT RX KIT (CATHETERS) IMPLANT
CATH INFINITI 5 FR JL3.5 (CATHETERS) IMPLANT
CATH INFINITI JR4 5F (CATHETERS) IMPLANT
CATH VISTA GUIDE 6FR JR4 ECOPK (CATHETERS) IMPLANT
DEVICE CLOSURE MYNXGRIP 6/7F (Vascular Products) IMPLANT
DRAPE BRACHIAL (DRAPES) IMPLANT
GLIDESHEATH SLEND SS 6F .021 (SHEATH) IMPLANT
GUIDEWIRE INQWIRE 1.5J.035X260 (WIRE) IMPLANT
KIT ENCORE 26 ADVANTAGE (KITS) IMPLANT
KIT ESSENTIALS PG (KITS) IMPLANT
NDL PERC 18GX7CM (NEEDLE) IMPLANT
NEEDLE PERC 18GX7CM (NEEDLE) ×1 IMPLANT
PACK CARDIAC CATH (CUSTOM PROCEDURE TRAY) ×1 IMPLANT
SET ATX-X65L (MISCELLANEOUS) IMPLANT
SHEATH ANL2 6FRX45 HC (SHEATH) IMPLANT
SHEATH AVANTI 6FR X 11CM (SHEATH) IMPLANT
SHEATH PINNACLE MP 6F 45CM (SHEATH) IMPLANT
STATION PROTECTION PRESSURIZED (MISCELLANEOUS) IMPLANT
STENT ONYX FRONTIER 3.0X12 (Permanent Stent) IMPLANT
STENT ONYX FRONTIER 3.0X15 (Permanent Stent) IMPLANT
SYSTEM COMPRESSION FEMOSTOP (HEMOSTASIS) IMPLANT
TUBING CIL FLEX 10 FLL-RA (TUBING) IMPLANT
WIRE G HI TQ BMW 190 (WIRE) IMPLANT
WIRE GUIDERIGHT .035X150 (WIRE) IMPLANT
WIRE HITORQ VERSACORE ST 145CM (WIRE) IMPLANT

## 2024-06-26 NOTE — Consult Note (Signed)
 CARDIOLOGY CONSULT NOTE               Patient ID: Kristina Cruz MRN: [id number] DOB/AGE: [birth year] 65 y.o.  Admit date: 06/26/2024 Referring Physician Dr. Viviann emergency room/Dr Adefeco hospitalist Primary Physician Babara Call Primary Cardiologist Darliss Rogue MD Reason for Consultation STEMI inferior  HPI: Patient presented acutely from home with acute chest discomfort which started about an hour prior to presentation she started having acute chest pain started to sweat went and took a shower did not improve so she finally called for rescue she complained the pain is a 11 out of 10 midsternal with shortness of breath felt like her truck was standing on her chest she was brought directly to emergency room hemodynamically stable EKG suggested ST elevation inferiorly reciprocal changes 1 mL.  Patient was given heparin  nitro and aspirin  in ER and brought to the cardiac Cath Lab for acute interventional management.  She has history of stage IV pancreatic cancer and has been getting chemo has had a remote history of possible mild myocardial infarction and is followed by [facility] Dr. Rogue Darliss.  Patient had not had any recent cardiac history most recently had TEE cardioversion back in May by [facility]  Review of systems complete and found to be negative unless listed above     Past Medical History:  Diagnosis Date   Anemia    Asthma    Back pain    Cataract    Diabetes mellitus without complication (HCC)    GERD (gastroesophageal reflux disease)    History of echocardiogram    Hypertension    Morbid obesity with BMI of 60.0-69.9, adult (HCC)    Restless leg syndrome     Past Surgical History:  Procedure Laterality Date   ABDOMINAL HYSTERECTOMY     CARDIOVERSION N/A 12/28/2023   Procedure: CARDIOVERSION;  Surgeon: Darliss Rogue, MD;  Location: [facility] ORS;  Service: Cardiovascular;  Laterality: N/A;   CATARACT EXTRACTION W/PHACO Left 04/13/2020   Procedure: CATARACT  EXTRACTION PHACO AND INTRAOCULAR LENS PLACEMENT (IOC);  Surgeon: Ferol Rogue, MD;  Location: [facility] ORS;  Service: Ophthalmology;  Laterality: Left;  US  00:36.0 CDE 5.95 Fluid Pack lot # [id number]   HYSTEROSCOPY WITH D & C N/A 07/14/2017   Procedure: DILATATION AND CURETTAGE /HYSTEROSCOPY;  Surgeon: Arloa Lamar SQUIBB, MD;  Location: [facility] ORS;  Service: Gynecology;  Laterality: N/A;   IR IMAGING GUIDED PORT INSERTION  12/17/2023   POLYPECTOMY  2015   TEE WITHOUT CARDIOVERSION N/A 12/28/2023   Procedure: ECHOCARDIOGRAM, TRANSESOPHAGEAL;  Surgeon: Darliss Rogue, MD;  Location: [facility] ORS;  Service: Cardiovascular;  Laterality: N/A;    (Not in a hospital admission)  Social History   Socioeconomic History   Marital status: Single    Spouse name: Not on file   Number of children: Not on file   Years of education: Not on file   Highest education level: Not on file  Occupational History   Not on file  Tobacco Use   Smoking status: Never   Smokeless tobacco: Never  Vaping Use   Vaping status: Never Used  Substance and Sexual Activity   Alcohol use: No   Drug use: No   Sexual activity: Never    Birth control/protection: None  Other Topics Concern   Not on file  Social History Narrative   Not on file   Social Drivers of Health   Financial Resource Strain: Low Risk  (05/11/2024)   Received from [facility]   Overall  Financial Resource Strain (CARDIA)    Difficulty of Paying Living Expenses: Not hard at all  Food Insecurity: No Food Insecurity (05/11/2024)   Received from [facility]   Hunger Vital Sign    Within the past 12 months, you worried that your food would run out before you got the money to buy more.: Never true    Within the past 12 months, the food you bought just didn't last and you didn't have money to get more.: Never true  Transportation Needs: No Transportation Needs (05/11/2024)   Received from [facility] - Transportation    In the past 12 months, has lack of transportation kept you from medical appointments or from getting medications?: No    Lack of Transportation (Non-Medical): No  Physical Activity: Not on file  Stress: Not on file  Social Connections: Socially Isolated (12/25/2023)   Social Connection and Isolation Panel    Frequency of Communication with Friends and Family: More than three times a week    Frequency of Social Gatherings with Friends and Family: More than three times a week    Attends Religious Services: Never    Database Administrator or Organizations: No    Attends Banker Meetings: Never    Marital Status: Never married  Intimate Partner Violence: Patient Declined (01/30/2024)   Humiliation, Afraid, Rape, and Kick questionnaire    Fear of Current or Ex-Partner: Patient declined    Emotionally Abused: Patient declined    Physically Abused: Patient declined    Sexually Abused: Patient declined    Family History  Problem Relation Age of Onset   Breast cancer Mother 87   Diabetes Mother    Hypertension Mother    Ovarian cancer Paternal Aunt        ?   Diabetes Father    Hypertension Father       Review of systems complete and found to be negative unless listed above      PHYSICAL EXAM  General: Well developed, well nourished, in no acute distress HEENT:  Normocephalic and atramatic Neck:  No JVD.  Lungs: Clear bilaterally to auscultation and percussion. Heart: HRRR . Normal S1 and S2 without gallops or murmurs.  Abdomen: Bowel sounds are positive, abdomen soft and non-tender  Msk:  Back normal, normal gait. Normal strength and tone for age. Extremities: No clubbing, cyanosis or edema.   Neuro: Alert and oriented X 3. Psych:  Good affect, responds appropriately  Labs:   Lab Results  Component Value Date   WBC 7.8 06/16/2024   HGB 12.4 06/16/2024   HCT 37.1 06/16/2024   MCV 94.6 06/16/2024   PLT 175 06/16/2024   No results  for input(s): NA, K, CL, CO2, BUN, CREATININE, CALCIUM , PROT, BILITOT, ALKPHOS, ALT, AST, GLUCOSE in the last 168 hours.  Invalid input(s): LABALBU Lab Results  Component Value Date   TROPONINI <0.03 02/16/2015   No results found for: CHOL No results found for: HDL No results found for: LDLCALC No results found for: TRIG No results found for: CHOLHDL No results found for: LDLDIRECT    Radiology: CT ABDOMEN PELVIS W CONTRAST Result Date: 06/09/2024 CLINICAL DATA:  Follow-up of malignant neoplasm of pancreas. * Tracking Code: BO * EXAM: CT ABDOMEN AND PELVIS WITH CONTRAST TECHNIQUE: Multidetector CT imaging of the abdomen and pelvis was performed using the standard protocol following bolus administration of intravenous contrast. RADIATION DOSE REDUCTION: This exam was performed according to  the departmental dose-optimization program which includes automated exposure control, adjustment of the mA and/or kV according to patient size and/or use of iterative reconstruction technique. CONTRAST:  OMNIPAQUE  IOHEXOL  300 MG/ML  SOLN COMPARISON:  12/25/2023 FINDINGS: Lower chest: Left base scarring. The left lower lobe cavitary nodule has resolved. Mild cardiomegaly, without pericardial or pleural effusion. Hepatobiliary: Bilateral hepatic metastasis. These are progressive. Example mass straddling segments 2 and 4A measures 3.2 x 3.0 cm on 24/2 versus 2.2 x 1.8 cm on the prior exam (when remeasured). Inferior right hepatic lobe subcapsular lesion measures 2.6 x 2.2 cm on 35/2 versus 1.3 x 1.5 cm on the prior exam (when remeasured). Cholecystectomy, without biliary ductal dilatation. Pancreas: Infiltrative pancreatic body and tail mass measures 7.2 x 4.3 cm on image 26/2 versus 7.5 x 4.2 cm on the prior, suggesting stability. Mild peripancreatic edema is similar. Spleen: Subcapsular hypoattenuating splenic lesions are increased. Example lesion medially measures 3.0 cm  on 10/02 and is new. A more rounded anterior splenic lesion measures 2.2 cm on 10/02 versus 2.4 cm on the prior exam (when remeasured). Adrenals/Urinary Tract: Normal adrenal glands. Normal kidneys, without hydronephrosis. Normal urinary bladder. Stomach/Bowel: Normal stomach, without wall thickening. Moderate stool within the rectum. Normal small bowel. Vascular/Lymphatic: Aortic atherosclerosis. 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 Patent portal vein and SMV. No abdominopelvic adenopathy. Reproductive: Hysterectomy.  No adnexal mass. Other: No free pelvic fluid. Trace perihepatic ascites is new. No evidence of omental or peritoneal disease. Musculoskeletal: Advanced degenerative changes of both hips and sacroiliac joints. Lumbosacral spondylosis. IMPRESSION: 1. Similar size of infiltrative pancreatic body/tail primary. Similar nonspecific edema in the anterior pararenal space. Cannot exclude superimposed pancreatitis. 2. Moderate progression of hepatic metastasis. 3. New hypoattenuating pancreatic lesions. Given subcapsular position and splenic vein involvement, infarcts are favored. Cannot exclude concurrent small volume metastasis. 4. Resolved left lower lobe cavitary nodule. 5. New trace perihepatic ascites. 6. Rectal stool volume suggests constipation. 7.  Aortic Atherosclerosis (ICD10-I70.0). Electronically Signed   By: Rockey Kilts M.D.   On: 06/09/2024 13:17    EKG: Sinus rhythm ST elevation inferiorly reciprocal depression in 1 and L  ASSESSMENT AND PLAN:  Inferior STEMI Stage IV pancreatic cancer Undergoing chemo Obesity Diabetes Hypertension GERD . Plan Status post PCI and stent to distal RCA with DES Acute coronary occlusion of the distal RCA consistent with ACS Hyperglycemia continue diabetes management with insulin  Maintain ICU level care Right groin hematoma will recommend FemoStop for hemostasis management High intensity statin therapy post  MI Hypertension management and control Echocardiogram for assessment left ventricular function valvular structures Consider sleep study for evaluation of obstructive sleep apnea Patient typed and crossed for at least 2 units because of groin hematoma and bleeding from her right radial side transfusion orders were not given yet and we will defer to hospitalist Recommend holding on starting Eliquis  for at least 24 to 48 hours because of groin hematoma and radial site bleeding Will transition cardiac care to [facility] in the morning she is a patient of Dr. Gollan  Signed: Cara JONETTA Lovelace MD 06/26/2024, 3:24 PM

## 2024-06-26 NOTE — ED Notes (Addendum)
Cardiologist [place] at bedside

## 2024-06-26 NOTE — ED Triage Notes (Signed)
 Pt arrived from home via [facility] d/t chest pain/pressure that started about an hour before arrival. EMS reports pt had dizziness and nausea. EMS reports pt was diaphoretic when they arrived. 324mg  aspirin  and nitroglycerin spray administered by EMS.

## 2024-06-26 NOTE — ED Provider Notes (Signed)
 [facility] Provider Note    Event Date/Time   First MD Initiated Contact with Patient 06/26/24 1506     (approximate)   History   Chief Complaint: Code STEMI   HPI  Kristina Cruz is a 63 y.o. female with a history of diabetes hypertension morbid obesity comes ED complaining of central chest heaviness feeling like a truck sitting on her that started 1 hour ago.  Constant, no aggravating or alleviating factors, associated with shortness of breath.  No dizziness or syncope, and nonradiating.  EMS activated code STEMI based on prehospital EKG.  They gave 1 nitro spray and 324 mg aspirin .  Patient reports pain is still severe.        Past Medical History:  Diagnosis Date   Anemia    Asthma    Back pain    Cataract    Diabetes mellitus without complication (HCC)    GERD (gastroesophageal reflux disease)    History of echocardiogram    Hypertension    Morbid obesity with BMI of 60.0-69.9, adult (HCC)    Restless leg syndrome        Past Surgical History:  Procedure Laterality Date   ABDOMINAL HYSTERECTOMY     CARDIOVERSION N/A 12/28/2023   Procedure: CARDIOVERSION;  Surgeon: Darliss Rogue, MD;  Location: [facility] ORS;  Service: Cardiovascular;  Laterality: N/A;   CATARACT EXTRACTION W/PHACO Left 04/13/2020   Procedure: CATARACT EXTRACTION PHACO AND INTRAOCULAR LENS PLACEMENT (IOC);  Surgeon: Ferol Rogue, MD;  Location: [facility] ORS;  Service: Ophthalmology;  Laterality: Left;  US  00:36.0 CDE 5.95 Fluid Pack lot # [id number]   HYSTEROSCOPY WITH D & C N/A 07/14/2017   Procedure: DILATATION AND CURETTAGE /HYSTEROSCOPY;  Surgeon: Arloa Lamar SQUIBB, MD;  Location: [facility] ORS;  Service: Gynecology;  Laterality: N/A;   IR IMAGING GUIDED PORT INSERTION  12/17/2023   POLYPECTOMY  2015   TEE WITHOUT CARDIOVERSION N/A 12/28/2023   Procedure: ECHOCARDIOGRAM, TRANSESOPHAGEAL;  Surgeon: Darliss Rogue, MD;  Location: [facility] ORS;  Service: Cardiovascular;   Laterality: N/A;    Physical Exam   Triage Vital Signs: ED Triage Vitals [06/26/24 1506]  Encounter Vitals Group     BP      Girls Systolic BP Percentile      Girls Diastolic BP Percentile      Boys Systolic BP Percentile      Boys Diastolic BP Percentile      Pulse      Resp      Temp      Temp src      SpO2 95 %     Weight      Height      Head Circumference      Peak Flow      Pain Score      Pain Loc      Pain Education      Exclude from Growth Chart     Most recent vital signs: Vitals:   06/26/24 1506 06/26/24 1510  BP:  (!) 142/95  Pulse:  76  Resp:  16  Temp:  97.6 F (36.4 C)  SpO2: 95% 100%    General: Awake, no distress.  CV:  Good peripheral perfusion.  Regular rate rhythm, normal distal pulses Resp:  Normal effort.  Clear lungs Abd:  No distention.  Soft nontender Other:  Chronic lymphedema/venous stasis disease of lower extremities   ED Results / Procedures / Treatments   Labs (all labs ordered are listed, but only abnormal  results are displayed) Labs Reviewed  CBC WITH DIFFERENTIAL/PLATELET - Abnormal; Notable for the following components:      Result Value   RBC 3.84 (*)    RDW 15.7 (*)    Platelets 133 (*)    All other components within normal limits  HEMOGLOBIN A1C  PROTIME-INR  APTT  COMPREHENSIVE METABOLIC PANEL WITH GFR  LIPID PANEL  LACTIC ACID, PLASMA  TROPONIN I (HIGH SENSITIVITY)     EKG Interpreted by me Sinus rhythm rate of 66.  Left axis, normal intervals.  Right bundle branch block.  ST elevation in inferior leads with ST depression and T wave inversion in the high lateral leads consistent with an inferior STEMI.   RADIOLOGY    PROCEDURES:  .Critical Care  Performed by: Viviann Pastor, MD Authorized by: Viviann Pastor, MD   Critical care provider statement:    Critical care time (minutes):  20   Critical care time was exclusive of:  Separately billable procedures and treating other patients    Critical care was necessary to treat or prevent imminent or life-threatening deterioration of the following conditions:  Cardiac failure   Critical care was time spent personally by me on the following activities:  Development of treatment plan with patient or surrogate, discussions with consultants, evaluation of patient's response to treatment, examination of patient, obtaining history from patient or surrogate, ordering and performing treatments and interventions, ordering and review of laboratory studies, ordering and review of radiographic studies, pulse oximetry, re-evaluation of patient's condition and review of old charts    MEDICATIONS ORDERED IN ED: Medications  0.9 %  sodium chloride  infusion (has no administration in time range)  nitroGLYCERIN (NITROSTAT) SL tablet 0.4 mg ( Sublingual MAR Hold 06/26/24 1535)  Heparin  (Porcine) in NaCl 1000-0.9 UT/500ML-% SOLN (1,000 mLs  Given 06/26/24 1538)  lidocaine  (PF) (XYLOCAINE ) 1 % injection (5 mLs Infiltration Given 06/26/24 1541)  heparin  injection 4,000 Units (4,000 Units Intravenous Given 06/26/24 1516)  fentaNYL  (SUBLIMAZE ) injection 25 mcg (25 mcg Intravenous Given 06/26/24 1517)     IMPRESSION / MDM / ASSESSMENT AND PLAN / ED COURSE  I reviewed the triage vital signs and the nursing notes.  Patient's presentation is most consistent with acute presentation with potential threat to life or bodily function.  Patient presents with central chest pain and prehospital EKG diagnostic of STEMI.  ED EKG obtained which confirms STEMI.  Symptoms are typical of ACS.  Hemodynamically stable, airway intact.  Heparin  bolus ordered in the ED.  Cardiology Dr. Florencio came to bedside and urgently took patient to Cath Lab.       FINAL CLINICAL IMPRESSION(S) / ED DIAGNOSES   Final diagnoses:  ST elevation myocardial infarction (STEMI), unspecified artery (HCC)     Rx / DC Orders   ED Discharge Orders     None        Note:  This document  was prepared using Dragon voice recognition software and may include unintentional dictation errors.   Viviann Pastor, MD 06/26/24 [phone]

## 2024-06-26 NOTE — ED Notes (Addendum)
[place], MD at bedside.

## 2024-06-26 NOTE — ED Notes (Signed)
 Pt to cath lab at 1520.

## 2024-06-26 NOTE — Progress Notes (Signed)
  Chaplain On-Call responded to Code STEMI notification at 1451 hours.  The patient is being attended by the Medical Team, and no family is present.  Chaplain assured Staff of availability as needed.  Chaplain Bebe Lay, M. Div., [facility]

## 2024-06-26 NOTE — Progress Notes (Signed)
 Patient presented with STEMI of inferior wall.  TRH was asked to admit patient s/p PCI and stent placement to distal RCA with DES. Patient was taken to ICU s/p Cath Lab.  Prior to examining patient, ICU NP for the PCCM team Neal Moose) sent a secure chat that she is hypotensive requiring levophed gtt and hypoxic HIGH RISK for Intubation. ICU team would like to take over her care. PCCM team was allowed to take over the care of the patient considering the gravity of patient's illness.

## 2024-06-27 ENCOUNTER — Inpatient Hospital Stay

## 2024-06-27 ENCOUNTER — Encounter: Payer: Self-pay | Admitting: Internal Medicine

## 2024-06-27 ENCOUNTER — Inpatient Hospital Stay: Admitting: Nurse Practitioner

## 2024-06-27 ENCOUNTER — Inpatient Hospital Stay (HOSPITAL_COMMUNITY)
Admit: 2024-06-27 | Discharge: 2024-06-27 | Disposition: A | Discharge status: Home / Self Care | Attending: Internal Medicine | Admitting: Internal Medicine

## 2024-06-27 DIAGNOSIS — I2111 ST elevation (STEMI) myocardial infarction involving right coronary artery: Secondary | ICD-10-CM

## 2024-06-27 DIAGNOSIS — I4891 Unspecified atrial fibrillation: Secondary | ICD-10-CM

## 2024-06-27 DIAGNOSIS — J9601 Acute respiratory failure with hypoxia: Secondary | ICD-10-CM | POA: Diagnosis not present

## 2024-06-27 DIAGNOSIS — I2119 ST elevation (STEMI) myocardial infarction involving other coronary artery of inferior wall: Secondary | ICD-10-CM | POA: Diagnosis not present

## 2024-06-27 DIAGNOSIS — G929 Unspecified toxic encephalopathy: Secondary | ICD-10-CM | POA: Diagnosis not present

## 2024-06-27 DIAGNOSIS — D62 Acute posthemorrhagic anemia: Secondary | ICD-10-CM

## 2024-06-27 DIAGNOSIS — R578 Other shock: Secondary | ICD-10-CM

## 2024-06-27 DIAGNOSIS — N179 Acute kidney failure, unspecified: Secondary | ICD-10-CM

## 2024-06-27 DIAGNOSIS — C259 Malignant neoplasm of pancreas, unspecified: Secondary | ICD-10-CM

## 2024-06-27 DIAGNOSIS — R571 Hypovolemic shock: Secondary | ICD-10-CM

## 2024-06-27 LAB — BLOOD GAS, ARTERIAL
Acid-Base Excess: 0.4 mmol/L (ref 0.0–2.0)
Acid-base deficit: 9.6 mmol/L — ABNORMAL HIGH (ref 0.0–2.0)
Bicarbonate: 15.9 mmol/L — ABNORMAL LOW (ref 20.0–28.0)
Bicarbonate: 22.9 mmol/L (ref 20.0–28.0)
FIO2: 40 %
FIO2: 0.4 %
MECHVT: 450 mL
MECHVT: 450 mL
Mechanical Rate: 16
O2 Saturation: 99.5 %
O2 Saturation: 97.9 %
PEEP: 5 cmH2O
PEEP: 5 cmH2O
Patient temperature: 37
Patient temperature: 37
RATE: 20 {breaths}/min
pCO2 arterial: 33 mmHg (ref 32–48)
pCO2 arterial: 30 mmHg — ABNORMAL LOW (ref 32–48)
pH, Arterial: 7.29 — ABNORMAL LOW (ref 7.35–7.45)
pH, Arterial: 7.49 — ABNORMAL HIGH (ref 7.35–7.45)
pO2, Arterial: 131 mmHg — ABNORMAL HIGH (ref 83–108)
pO2, Arterial: 81 mmHg — ABNORMAL LOW (ref 83–108)

## 2024-06-27 LAB — DIC (DISSEMINATED INTRAVASCULAR COAGULATION)PANEL
D-Dimer, Quant: 11.15 ug{FEU}/mL — ABNORMAL HIGH (ref 0.00–0.50)
Fibrinogen: 162 mg/dL — ABNORMAL LOW (ref 210–475)
INR: 2 — ABNORMAL HIGH (ref 0.8–1.2)
Platelets: 93 K/uL — ABNORMAL LOW (ref 150–400)
Prothrombin Time: 23.7 s — ABNORMAL HIGH (ref 11.4–15.2)
Smear Review: NONE SEEN
aPTT: 34 s (ref 24–36)

## 2024-06-27 LAB — URINALYSIS, COMPLETE (UACMP) WITH MICROSCOPIC
Bacteria, UA: NONE SEEN
Bilirubin Urine: NEGATIVE
Glucose, UA: >=500 mg/dL — AB
Ketones, ur: NEGATIVE mg/dL
Leukocytes,Ua: NEGATIVE
Nitrite: NEGATIVE
Protein, ur: NEGATIVE mg/dL
Specific Gravity, Urine: >1.046 — ABNORMAL HIGH (ref 1.005–1.030)
pH: 5 (ref 5.0–8.0)

## 2024-06-27 LAB — BASIC METABOLIC PANEL WITH GFR
Anion gap: 22 — ABNORMAL HIGH (ref 5–15)
Anion gap: 21 — ABNORMAL HIGH (ref 5–15)
Anion gap: 21 — ABNORMAL HIGH (ref 5–15)
Anion gap: 18 — ABNORMAL HIGH (ref 5–15)
Anion gap: 14 (ref 5–15)
BUN: 20 mg/dL (ref 8–23)
BUN: 23 mg/dL (ref 8–23)
BUN: 22 mg/dL (ref 8–23)
BUN: 23 mg/dL (ref 8–23)
BUN: 26 mg/dL — ABNORMAL HIGH (ref 8–23)
CO2: 10 mmol/L — ABNORMAL LOW (ref 22–32)
CO2: 12 mmol/L — ABNORMAL LOW (ref 22–32)
CO2: 17 mmol/L — ABNORMAL LOW (ref 22–32)
CO2: 19 mmol/L — ABNORMAL LOW (ref 22–32)
CO2: 23 mmol/L (ref 22–32)
Calcium: 7.2 mg/dL — ABNORMAL LOW (ref 8.9–10.3)
Calcium: 7.7 mg/dL — ABNORMAL LOW (ref 8.9–10.3)
Calcium: 7.7 mg/dL — ABNORMAL LOW (ref 8.9–10.3)
Calcium: 7.7 mg/dL — ABNORMAL LOW (ref 8.9–10.3)
Calcium: 7.6 mg/dL — ABNORMAL LOW (ref 8.9–10.3)
Chloride: 99 mmol/L (ref 98–111)
Chloride: 99 mmol/L (ref 98–111)
Chloride: 100 mmol/L (ref 98–111)
Chloride: 102 mmol/L (ref 98–111)
Chloride: 102 mmol/L (ref 98–111)
Creatinine, Ser: 1.16 mg/dL — ABNORMAL HIGH (ref 0.44–1.00)
Creatinine, Ser: 1.34 mg/dL — ABNORMAL HIGH (ref 0.44–1.00)
Creatinine, Ser: 1.25 mg/dL — ABNORMAL HIGH (ref 0.44–1.00)
Creatinine, Ser: 1.17 mg/dL — ABNORMAL HIGH (ref 0.44–1.00)
Creatinine, Ser: 1.29 mg/dL — ABNORMAL HIGH (ref 0.44–1.00)
GFR, Estimated: 52 mL/min — ABNORMAL LOW (ref >60)
GFR, Estimated: 44 mL/min — ABNORMAL LOW (ref >60)
GFR, Estimated: 48 mL/min — ABNORMAL LOW (ref >60)
GFR, Estimated: 52 mL/min — ABNORMAL LOW (ref >60)
GFR, Estimated: 46 mL/min — ABNORMAL LOW (ref >60)
Glucose, Bld: 645 mg/dL (ref 70–99)
Glucose, Bld: 572 mg/dL (ref 70–99)
Glucose, Bld: 344 mg/dL — ABNORMAL HIGH (ref 70–99)
Glucose, Bld: 143 mg/dL — ABNORMAL HIGH (ref 70–99)
Glucose, Bld: 38 mg/dL — CL (ref 70–99)
Potassium: 3.9 mmol/L (ref 3.5–5.1)
Potassium: 4.2 mmol/L (ref 3.5–5.1)
Potassium: 2.9 mmol/L — ABNORMAL LOW (ref 3.5–5.1)
Potassium: 3.2 mmol/L — ABNORMAL LOW (ref 3.5–5.1)
Potassium: 4.3 mmol/L (ref 3.5–5.1)
Sodium: 131 mmol/L — ABNORMAL LOW (ref 135–145)
Sodium: 132 mmol/L — ABNORMAL LOW (ref 135–145)
Sodium: 138 mmol/L (ref 135–145)
Sodium: 138 mmol/L (ref 135–145)
Sodium: 139 mmol/L (ref 135–145)

## 2024-06-27 LAB — ECHOCARDIOGRAM COMPLETE
Height: 61 in
S' Lateral: 3.2 cm
Weight: 3908.31 [oz_av]

## 2024-06-27 LAB — CBC
HCT: 22 % — ABNORMAL LOW (ref 36.0–46.0)
HCT: 24.6 % — ABNORMAL LOW (ref 36.0–46.0)
HCT: 26.7 % — ABNORMAL LOW (ref 36.0–46.0)
Hemoglobin: 7 g/dL — ABNORMAL LOW (ref 12.0–15.0)
Hemoglobin: 8.3 g/dL — ABNORMAL LOW (ref 12.0–15.0)
Hemoglobin: 9.8 g/dL — ABNORMAL LOW (ref 12.0–15.0)
MCH: 31.5 pg (ref 26.0–34.0)
MCH: 31 pg (ref 26.0–34.0)
MCH: 31.6 pg (ref 26.0–34.0)
MCHC: 31.8 g/dL (ref 30.0–36.0)
MCHC: 33.7 g/dL (ref 30.0–36.0)
MCHC: 36.7 g/dL — ABNORMAL HIGH (ref 30.0–36.0)
MCV: 99.1 fL (ref 80.0–100.0)
MCV: 91.8 fL (ref 80.0–100.0)
MCV: 86.1 fL (ref 80.0–100.0)
Platelets: 157 K/uL (ref 150–400)
Platelets: 92 K/uL — ABNORMAL LOW (ref 150–400)
Platelets: 82 K/uL — ABNORMAL LOW (ref 150–400)
RBC: 2.22 MIL/uL — ABNORMAL LOW (ref 3.87–5.11)
RBC: 2.68 MIL/uL — ABNORMAL LOW (ref 3.87–5.11)
RBC: 3.1 MIL/uL — ABNORMAL LOW (ref 3.87–5.11)
RDW: 16 % — ABNORMAL HIGH (ref 11.5–15.5)
RDW: 15.5 % (ref 11.5–15.5)
RDW: 15.5 % (ref 11.5–15.5)
WBC: 20.9 K/uL — ABNORMAL HIGH (ref 4.0–10.5)
WBC: 15.1 K/uL — ABNORMAL HIGH (ref 4.0–10.5)
WBC: 21 K/uL — ABNORMAL HIGH (ref 4.0–10.5)
nRBC: 0.3 % — ABNORMAL HIGH (ref 0.0–0.2)
nRBC: 0.4 % — ABNORMAL HIGH (ref 0.0–0.2)
nRBC: 0.6 % — ABNORMAL HIGH (ref 0.0–0.2)

## 2024-06-27 LAB — PREPARE RBC (CROSSMATCH)

## 2024-06-27 LAB — GLUCOSE, CAPILLARY
Glucose-Capillary: 532 mg/dL (ref 70–99)
Glucose-Capillary: 538 mg/dL (ref 70–99)
Glucose-Capillary: 485 mg/dL — ABNORMAL HIGH (ref 70–99)
Glucose-Capillary: 494 mg/dL — ABNORMAL HIGH (ref 70–99)
Glucose-Capillary: 467 mg/dL — ABNORMAL HIGH (ref 70–99)
Glucose-Capillary: 507 mg/dL (ref 70–99)
Glucose-Capillary: 435 mg/dL — ABNORMAL HIGH (ref 70–99)
Glucose-Capillary: 435 mg/dL — ABNORMAL HIGH (ref 70–99)
Glucose-Capillary: 331 mg/dL — ABNORMAL HIGH (ref 70–99)
Glucose-Capillary: 376 mg/dL — ABNORMAL HIGH (ref 70–99)
Glucose-Capillary: 295 mg/dL — ABNORMAL HIGH (ref 70–99)
Glucose-Capillary: 226 mg/dL — ABNORMAL HIGH (ref 70–99)
Glucose-Capillary: 255 mg/dL — ABNORMAL HIGH (ref 70–99)
Glucose-Capillary: 176 mg/dL — ABNORMAL HIGH (ref 70–99)
Glucose-Capillary: 64 mg/dL — ABNORMAL LOW (ref 70–99)
Glucose-Capillary: 71 mg/dL (ref 70–99)
Glucose-Capillary: 180 mg/dL — ABNORMAL HIGH (ref 70–99)
Glucose-Capillary: 213 mg/dL — ABNORMAL HIGH (ref 70–99)
Glucose-Capillary: 49 mg/dL — ABNORMAL LOW (ref 70–99)

## 2024-06-27 LAB — BLOOD GAS, VENOUS

## 2024-06-27 LAB — LACTIC ACID, PLASMA
Lactic Acid, Venous: >9 mmol/L (ref 0.5–1.9)
Lactic Acid, Venous: >9 mmol/L (ref 0.5–1.9)
Lactic Acid, Venous: 8.8 mmol/L (ref 0.5–1.9)
Lactic Acid, Venous: 4.1 mmol/L (ref 0.5–1.9)
Lactic Acid, Venous: 4.3 mmol/L (ref 0.5–1.9)
Lactic Acid, Venous: 3.7 mmol/L (ref 0.5–1.9)

## 2024-06-27 LAB — HEMOGLOBIN AND HEMATOCRIT, BLOOD
HCT: 28.3 % — ABNORMAL LOW (ref 36.0–46.0)
HCT: 28.9 % — ABNORMAL LOW (ref 36.0–46.0)
Hemoglobin: 10.3 g/dL — ABNORMAL LOW (ref 12.0–15.0)
Hemoglobin: 10.6 g/dL — ABNORMAL LOW (ref 12.0–15.0)

## 2024-06-27 LAB — BETA-HYDROXYBUTYRIC ACID
Beta-Hydroxybutyric Acid: 0.79 mmol/L — ABNORMAL HIGH (ref 0.05–0.27)
Beta-Hydroxybutyric Acid: 0.48 mmol/L — ABNORMAL HIGH (ref 0.05–0.27)
Beta-Hydroxybutyric Acid: 0.17 mmol/L (ref 0.05–0.27)
Beta-Hydroxybutyric Acid: 0.09 mmol/L (ref 0.05–0.27)

## 2024-06-27 LAB — MASSIVE TRANSFUSION PROTOCOL ORDER (BLOOD BANK NOTIFICATION)

## 2024-06-27 LAB — TROPONIN I (HIGH SENSITIVITY): Troponin I (High Sensitivity): >24000 ng/L (ref <18)

## 2024-06-27 MED ORDER — SODIUM CHLORIDE 0.9% IV SOLUTION: Freq: Once | INTRAVENOUS | Status: AC

## 2024-06-27 MED ORDER — IOHEXOL 300 MG/ML  SOLN
INTRAMUSCULAR | Status: DC | PRN
  Administered 2024-06-26: 286 mL

## 2024-06-27 MED ORDER — INSULIN ASPART 100 UNIT/ML IJ SOLN: 3.0000 [IU] | INTRAMUSCULAR | Status: DC

## 2024-06-27 MED ORDER — ROCURONIUM BROMIDE 10 MG/ML (PF) SYRINGE
100.0000 mg | PREFILLED_SYRINGE | Freq: Once | INTRAVENOUS | Status: AC
  Administered 2024-06-27: 100 mg via INTRAVENOUS

## 2024-06-27 MED ORDER — ETOMIDATE 2 MG/ML IV SOLN
INTRAVENOUS | Status: AC
  Filled 2024-06-27: qty 10

## 2024-06-27 MED ORDER — ETOMIDATE 2 MG/ML IV SOLN
10.0000 mg | Freq: Once | INTRAVENOUS | Status: AC
  Administered 2024-06-27: 10 mg via INTRAVENOUS

## 2024-06-27 MED ORDER — PIPERACILLIN-TAZOBACTAM 3.375 G IVPB
3.3750 g | Freq: Three times a day (TID) | INTRAVENOUS | Status: DC
  Administered 2024-06-27 – 2024-07-01 (×13): 3.375 g via INTRAVENOUS
  Filled 2024-06-27 (×13): qty 50

## 2024-06-27 MED ORDER — POTASSIUM CHLORIDE 10 MEQ/100ML IV SOLN
10.0000 meq | INTRAVENOUS | Status: AC
  Administered 2024-06-27 (×2): 10 meq via INTRAVENOUS
  Filled 2024-06-27 (×2): qty 100

## 2024-06-27 MED ORDER — SODIUM BICARBONATE 8.4 % IV SOLN
100.0000 meq | Freq: Once | INTRAVENOUS | Status: AC
  Administered 2024-06-27: 100 meq via INTRAVENOUS

## 2024-06-27 MED ORDER — INSULIN ASPART 100 UNIT/ML IJ SOLN
3.0000 [IU] | INTRAMUSCULAR | Status: DC
  Administered 2024-06-27: 3 [IU] via SUBCUTANEOUS
  Administered 2024-06-28: 6 [IU] via SUBCUTANEOUS
  Administered 2024-06-28: 3 [IU] via SUBCUTANEOUS
  Administered 2024-06-28 (×2): 6 [IU] via SUBCUTANEOUS
  Administered 2024-06-29: 9 [IU] via SUBCUTANEOUS
  Administered 2024-06-29 (×3): 3 [IU] via SUBCUTANEOUS
  Filled 2024-06-27 (×3): qty 3
  Filled 2024-06-27: qty 6
  Filled 2024-06-27: qty 3
  Filled 2024-06-27: qty 9
  Filled 2024-06-27 (×2): qty 6
  Filled 2024-06-27: qty 3

## 2024-06-27 MED ORDER — PERFLUTREN LIPID MICROSPHERE
1.0000 mL | INTRAVENOUS | Status: AC | PRN
Start: 2024-06-27 — End: 2024-06-27
  Administered 2024-06-27: 3 mL via INTRAVENOUS

## 2024-06-27 MED ORDER — PANTOPRAZOLE SODIUM 40 MG IV SOLR
40.0000 mg | Freq: Every day | INTRAVENOUS | Status: DC
  Administered 2024-06-27 – 2024-06-30 (×4): 40 mg via INTRAVENOUS
  Filled 2024-06-27 (×4): qty 10

## 2024-06-27 MED ORDER — INSULIN ASPART 100 UNIT/ML IV SOLN
10.0000 [IU] | Freq: Once | INTRAVENOUS | Status: AC
  Administered 2024-06-27: 10 [IU] via INTRAVENOUS
  Filled 2024-06-27: qty 0.1

## 2024-06-27 MED ORDER — SODIUM CHLORIDE 0.9% IV SOLUTION
Freq: Once | INTRAVENOUS | Status: AC
  Administered 2024-06-27: 10 mL/h via INTRAVENOUS

## 2024-06-27 MED ORDER — ORAL CARE MOUTH RINSE: 15.0000 mL | OROMUCOSAL | Status: DC | PRN

## 2024-06-27 MED ORDER — PRASUGREL HCL 10 MG PO TABS
10.0000 mg | ORAL_TABLET | Freq: Every day | ORAL | Status: DC
  Administered 2024-06-27 – 2024-07-01 (×5): 10 mg
  Filled 2024-06-27 (×5): qty 1

## 2024-06-27 MED ORDER — ASPIRIN 81 MG PO CHEW
81.0000 mg | CHEWABLE_TABLET | Freq: Every day | ORAL | Status: DC
  Administered 2024-06-27 – 2024-07-01 (×5): 81 mg
  Filled 2024-06-27 (×5): qty 1

## 2024-06-27 MED ORDER — LACTATED RINGERS IV BOLUS
1000.0000 mL | Freq: Once | INTRAVENOUS | Status: AC
  Administered 2024-06-27: 1000 mL via INTRAVENOUS

## 2024-06-27 MED ORDER — POTASSIUM CHLORIDE 20 MEQ PO PACK
40.0000 meq | PACK | Freq: Once | ORAL | Status: AC
  Administered 2024-06-27: 40 meq
  Filled 2024-06-27: qty 2

## 2024-06-27 MED ORDER — SODIUM BICARBONATE 8.4 % IV SOLN
50.0000 meq | Freq: Once | INTRAVENOUS | Status: AC
  Administered 2024-06-27: 50 meq via INTRAVENOUS

## 2024-06-27 MED ORDER — ORAL CARE MOUTH RINSE
15.0000 mL | OROMUCOSAL | Status: DC
  Administered 2024-06-27 – 2024-07-01 (×47): 15 mL via OROMUCOSAL

## 2024-06-27 MED ORDER — ROCURONIUM BROMIDE 10 MG/ML (PF) SYRINGE
PREFILLED_SYRINGE | INTRAVENOUS | Status: AC
  Filled 2024-06-27: qty 10

## 2024-06-27 MED ORDER — DEXTROSE IN LACTATED RINGERS 5 % IV SOLN: INTRAVENOUS | Status: DC

## 2024-06-27 NOTE — Consult Note (Signed)
 PHARMACY CONSULT NOTE - FOLLOW UP  Pharmacy Consult for Electrolyte Monitoring and Replacement   Recent Labs: Potassium (mmol/L)  Date Value  06/27/2024 4.2  04/10/2014 3.4 (L)   Magnesium  (mg/dL)  Date Value  88/90/7974 2.2   Calcium  (mg/dL)  Date Value  88/89/7974 7.7 (L)   Calcium , Total (mg/dL)  Date Value  91/75/7984 8.8   Albumin  (g/dL)  Date Value  88/90/7974 2.5 (L)  04/10/2014 3.1 (L)   Phosphorus (mg/dL)  Date Value  93/85/7974 2.2 (L)   Sodium (mmol/L)  Date Value  06/27/2024 132 (L)  04/10/2014 143     Assessment: Kristina Cruz is a 67 yoF that presented with chest pain. Past medical history is notable for diabetes, hypertension, and morbid obesity.  Goal of Therapy: electrolytes wnl  Plan:  K 2.9 - replaced 20mEq Kcl IV Monitoring electrolytes q4h  Leonor JAYSON Argyle ,PharmD 06/27/2024 7:22 AM

## 2024-06-27 NOTE — Inpatient Diabetes Management (Signed)
 Inpatient Diabetes Program Recommendations  AACE/ADA: New Consensus Statement on Inpatient Glycemic Control   Target Ranges:  Prepandial:   less than 140 mg/dL      Peak postprandial:   less than 180 mg/dL (1-2 hours)      Critically ill patients:  140 - 180 mg/dL    Latest Reference Range & Units 06/27/24 01:44 06/27/24 02:24 06/27/24 03:34 06/27/24 03:58 06/27/24 04:29 06/27/24 04:56 06/27/24 05:53 06/27/24 06:22 06/27/24 07:29  Glucose-Capillary 70 - 99 mg/dL 514 (H) 505 (H) 492 (HH) 467 (H) 435 (H) 435 (H) 376 (H) 331 (H) 295 (H)    Latest Reference Range & Units 06/26/24 15:10 06/26/24 18:50 06/26/24 23:02 06/27/24 03:20  Glucose 70 - 99 mg/dL 536 (H) 458 (HH) 354 (HH) 572 (HH)    Latest Reference Range & Units 08/22/23 05:26 12/25/23 22:00 06/26/24 15:10  Hemoglobin A1C 4.8 - 5.6 % 8.7 (H) 10.9 (H) 6.4 (H)   Review of Glycemic Control  Diabetes history: DM2 Outpatient Diabetes medications: Metformin  1000 mg BID (has been on Ozempic in the past); Decadron  4 mg daily after chemotherapy x 2 days Current orders for Inpatient glycemic control: IV insulin   Inpatient Diabetes Program Recommendations:    Insulin : Recommend to continue IV insulin  at this time. Once glucose is better controlled and provider is ready to transition to SQ insulin  regimen, would recommend using ICU Glycemic Control order set.  Labs: Noted chemotherapy medications patient was receiving could cause patient to develop Fulminant Type 1 DM.  May want to consider ordering C-peptide. If patient has developed fulminant Type 1 DM due to chemotherapy medication, Islet-related autoantibodies are typically negative.  NOTE: Patient admitted with STEMI and went for emergent cath on 06/26/24. Noted patient was intubated this morning at 5:21 am. CBG up to 645 mg/dl on 88/0/74 at 76:97. Patient was started on IV insulin  and is currently on IV insulin  with most current CBG of  295 mg/dl at 2:70 am today and insulin  drip rate is 42  units/hour. Would recommend to continue on IV insulin  at this time. Per chart, patient has hx of stage IV pancreatic cancer with metastasis. Noted patient seen Dr. Babara on 06/16/24 and per note switching to 5-FU and liposomal irinotecan, given her PS, will dose reduce. Rationale and side effects were reviewed with patient. She agrees with the plan. Will arrange her to to treatment early next week.  Lab glucose was 154 mg/dl on 89/69/74.  Noted patient received chemotherapy infusion on 06/22/24 along with Decadron  10 mg IV. Lab glucose was 463 mg/dl on 88/0/74 on initial presentation to the hospital.  In researching chemotherapy medications that patient is receiving and effect on glucose and diabetes, it was noted that Liposomal irinotecan, especially in combination with 5-FU, has been linked to a rare but severe form of diabetes called fulminant Type 1 diabetes.  Thanks, Earnie Gainer, RN, MSN, CDCES Diabetes Coordinator Inpatient Diabetes Program [phone] (Team Pager from 8am to 5pm)

## 2024-06-27 NOTE — Progress Notes (Addendum)
 0800 Patient very unstable. Presently on Quad strength Levophed at 23 mcg/min. Insulin  drip infusing at 42 units /hour. LR infusing at 125ml/hr. Precedex infusing at 0.6 mcg/kg/hr. .Patient not reactive to pain. Turned to right side and skin check done.  0830. Patient not tolerating turn to right . Patient placed supine. Mass transfusion order placed per Dr. Isadora. 3 units PRBCs and 1 unit of FFP given per verbal orders. Drips adjusted as needed. 1445 Blood pressure dropped per cuff . Unable to get a accurate cuff pressure. JINNY Lecher NP notified. 1515 Arterial line placed per J.Keene NP.   1600 1 amp of Sodium Bicarb IVP per order. Patient immediately responded with a bllod pressure of 120/80.

## 2024-06-27 NOTE — Procedures (Signed)
 CENTRAL VENOUS CATHETER INSERTION PROCEDURE NOTE  Kristina Cruz  [id number]  [birth year]  Date:06/27/24  Time:7:21 AM   Provider Performing:Lexiana Spindel A Meyah Corle   Procedure: Insertion of Non-tunneled Central Venous [phone]) with US  guidance (23062)   Indication(s) Medication administration and Difficult access  Consent Unable to obtain consent due to emergent nature of procedure.  Anesthesia Topical only with 1% lidocaine    Timeout Verified patient identification, verified procedure, site/side was marked, verified correct patient position, special equipment/implants available, medications/allergies/relevant history reviewed, required imaging and test results available.  Sterile Technique Maximal sterile technique including full sterile barrier drape, hand hygiene, sterile gown, sterile gloves, mask, hair covering, sterile ultrasound probe cover (if used).  Procedure Description Area of catheter insertion was cleaned with chlorhexidine  and draped in sterile fashion.  With real-time ultrasound guidance a central venous catheter was placed into the left internal jugular vein. Nonpulsatile blood flow and easy flushing noted in all ports.  The catheter was sutured in place and sterile dressing applied.  Complications/Tolerance None; patient tolerated the procedure well. Chest X-ray is ordered to verify placement for internal jugular or subclavian cannulation.   Chest x-ray is not ordered for femoral cannulation.  EBL Minimal  Specimen(s) None     [name] DNP, CCRN, FNP-C, AGACNP-BC Acute Care & Family Nurse Practitioner [facility] PCCM on call pager [phone]

## 2024-06-27 NOTE — Procedures (Signed)
 INTUBATION PROCEDURE NOTE  Kristina Cruz  [id number]  [birth year]  Date:06/27/24  Time:5:21 AM   Provider Performing:Bayden Gil A Kerina Simoneau   Procedure: Intubation (31500)  Indication(s) Respiratory Failure  Consent Unable to obtain consent due to emergent nature of procedure.  Anesthesia Etomidate and Rocuronium   Time Out Verified patient identification, verified procedure, site/side was marked, verified correct patient position, special equipment/implants available, medications/allergies/relevant history reviewed, required imaging and test results available.  Sterile Technique Usual hand hygeine, masks, and gloves were used  Procedure Description Patient positioned in bed supine.  Sedation given as noted above.  Patient was intubated with endotracheal tube using Glidescope.  View was Grade 1 full glottis .  Number of attempts was 1.  Colorimetric CO2 detector was consistent with tracheal placement.  Complications/Tolerance None; patient tolerated the procedure well. Chest X-ray is ordered to verify placement.  EBL Minimal  Specimen(s) None     [name] DNP, CCRN, FNP-C, AGACNP-BC Acute Care & Family Nurse Practitioner [facility] PCCM on call pager [phone]

## 2024-06-27 NOTE — Progress Notes (Signed)
 Rounding Note   Patient Name: Kristina Cruz Date of Encounter: 06/27/2024  [facility] Cardiologist: Evalene Lunger, MD   Subjective Patient seen on a.m. rounds.  Remains intubated and sedated.  Currently on pressors.  Receiving blood infusions.   Scheduled Meds:  sodium chloride    Intravenous Once   aspirin   81 mg Oral Daily   Chlorhexidine  Gluconate Cloth  6 each Topical Daily   free water  500 mL Oral Once   mouth rinse  15 mL Mouth Rinse Q2H   prasugrel  10 mg Oral Daily   sodium chloride  flush  3 mL Intravenous Q12H   Continuous Infusions:  sodium chloride  10 mL/hr at 06/27/24 0641   sodium chloride      dexmedetomidine (PRECEDEX) IV infusion 0.6 mcg/kg/hr (06/27/24 0641)   dextrose  5% lactated ringers  Stopped (06/26/24 2055)   insulin  42 Units/hr (06/27/24 0736)   lactated ringers  125 mL/hr at 06/27/24 0641   norepinephrine (LEVOPHED) Adult infusion 20 mcg/min (06/27/24 0719)   PRN Meds: sodium chloride , acetaminophen , dextrose , iohexol , nitroGLYCERIN, ondansetron  (ZOFRAN ) IV, mouth rinse, mouth rinse, sodium chloride  flush, sodium chloride  flush   Vital Signs  Vitals:   06/27/24 0630 06/27/24 0645 06/27/24 0700 06/27/24 0715  BP: (!) 141/97 (!) 107/58 (!) 105/44 (!) 141/84  Pulse: (!) 109 (!) 113 (!) 110 (!) 131  Resp: 17 18 18 20   Temp:      TempSrc:      SpO2: 98% 99% 97% 96%  Weight:      Height:        Intake/Output Summary (Last 24 hours) at 06/27/2024 0825 Last data filed at 06/27/2024 0641 Gross per 24 hour  Intake 4325.17 ml  Output 975 ml  Net 3350.17 ml      06/26/2024    8:10 PM 06/26/2024    6:34 PM 06/26/2024    3:14 PM  Last 3 Weights  Weight (lbs) 244 lb 4.3 oz 239 lb 3.2 oz 246 lb  Weight (kg) 110.8 kg 108.5 kg 111.585 kg      Telemetry Sinus tachycardia- Personally Reviewed  ECG  No new tracings- Personally Reviewed  Physical Exam  GEN: Critically ill Neck: Unable to determine JVD due to body habitus and  commercial tube holder Cardiac: RRR, tachycardic, no murmurs, rubs, or gallops.  Pressure dressing to the right femoral C/D/I, 2+ femoral, posttibial and dorsalis pedis pulses Respiratory: Clear with mild crackles in the bases to auscultation bilaterally.  Respirations are vent assisted.  GI: Soft, nontender, obese, non-distended  MS: No edema; No deformity. Neuro:  Nonfocal  Psych: Normal affect   Labs High Sensitivity Troponin:   Recent Labs  Lab 06/26/24 1510 06/26/24 1850 06/26/24 2302  TROPONINIHS 74* 9,572* >24,000*     Chemistry Recent Labs  Lab 06/26/24 1510 06/26/24 1850 06/26/24 2302 06/27/24 0320  NA 132* 132* 131* 132*  K 3.4* 3.4* 3.9 4.2  CL 95* 97* 99 99  CO2 23 19* 10* 12*  GLUCOSE 463* 541* 645* 572*  BUN 19 21 20 23   CREATININE 0.93 0.99 1.16* 1.34*  CALCIUM  8.5* 7.5* 7.2* 7.7*  MG  --  2.2  --   --   PROT 8.2* 5.8*  --   --   ALBUMIN  3.5 2.5*  --   --   AST 42* 39  --   --   ALT 32 25  --   --   ALKPHOS 222* 152*  --   --   BILITOT 1.7* 1.5*  --   --  GFRNONAA >60 >60 52* 44*  ANIONGAP 14 16* 22* 21*    Lipids  Recent Labs  Lab 06/26/24 1510  CHOL 175  TRIG 145  HDL 48  LDLCALC 98  CHOLHDL 3.6    Hematology Recent Labs  Lab 06/26/24 1510 06/26/24 1850 06/27/24 0320  WBC 9.4 12.7* 20.9*  RBC 3.84* 2.61* 2.22*  HGB 12.0 8.2* 7.0*  HCT 36.3 25.1* 22.0*  MCV 94.5 96.2 99.1  MCH 31.3 31.4 31.5  MCHC 33.1 32.7 31.8  RDW 15.7* 15.9* 16.0*  PLT 133* 171 157   Thyroid  No results for input(s): TSH, FREET4 in the last 168 hours.  BNPNo results for input(s): BNP, PROBNP in the last 168 hours.  DDimer No results for input(s): DDIMER in the last 168 hours.   Radiology  DG Abd 1 View Result Date: 06/27/2024 CLINICAL DATA:  NG tube placement. EXAM: ABDOMEN - 1 VIEW COMPARISON:  12/21/2014 FINDINGS: NG tube tip is in the mid stomach with proximal side port in the region of the GE junction. NG tube could be advanced 4-5 cm to  place the side port below the GE junction as clinically warranted. Visualized abdomen demonstrates nonspecific bowel gas pattern. IMPRESSION: NG tube tip is in the mid stomach with proximal side port in the region of the GE junction. NG tube could be advanced 4-5 cm to place the side port below the GE junction as clinically warranted. Electronically Signed   By: Camellia Candle M.D.   On: 06/27/2024 05:57   DG Chest Port 1 View Result Date: 06/27/2024 CLINICAL DATA:  Status post intubation. EXAM: PORTABLE CHEST 1 VIEW COMPARISON:  06/27/2024, earlier same day FINDINGS: 0534 hours. Endotracheal tube tip is proximally 3.3 cm above the base of the carina. The NG tube passes into the stomach although the distal tip position is not included on the film. Right Port-A-Cath tip overlies the upper right atrium. Left IJ central line tip overlies the proximal SVC level. Lung volumes are low. Basilar atelectasis/infiltrate evident, left greater than right with tiny left pleural effusion. The cardio pericardial silhouette is enlarged. Telemetry leads overlie the chest. IMPRESSION: 1. Endotracheal tube tip is 3.3 cm above the base of the carina. 2. Low volume film with bibasilar atelectasis/infiltrate and tiny left pleural effusion. Electronically Signed   By: Camellia Candle M.D.   On: 06/27/2024 05:56   DG Chest Port 1 View Result Date: 06/27/2024 CLINICAL DATA:  Status post left jugular central line placement EXAM: PORTABLE CHEST 1 VIEW COMPARISON:  Film from the previous day. FINDINGS: Cardiac shadow is stable. Right chest wall port is again seen in satisfactory position. New left jugular central line is noted at the cavoatrial junction. No pneumothorax is seen. No focal infiltrate is seen. IMPRESSION: No pneumothorax following central line placement. Electronically Signed   By: Oneil Devonshire M.D.   On: 06/27/2024 03:45   DG Chest Port 1 View Result Date: 06/26/2024 EXAM: 1 VIEW(S) XRAY OF THE CHEST 06/26/2024 07:04:08  PM COMPARISON: 05/12/2024 CLINICAL HISTORY: Dyspnea FINDINGS: LINES, TUBES AND DEVICES: Stable right chest port. LUNGS AND PLEURA: Low lung volumes. Small left pleural effusion. No focal pulmonary opacity. No pulmonary edema. No pneumothorax. HEART AND MEDIASTINUM: No acute abnormality of the cardiac and mediastinal silhouettes. BONES AND SOFT TISSUES: No acute osseous abnormality. IMPRESSION: 1. Small left pleural effusion. Electronically signed by: Norman Gatlin MD 06/26/2024 07:20 PM EST RP Workstation: [id number]   CARDIAC CATHETERIZATION Result Date: 06/26/2024   Mid RCA to Dist RCA  lesion is 100% stenosed.   A drug-eluting stent was successfully placed using a STENT ONYX FRONTIER 3.0X15.   A drug-eluting stent was successfully placed using a STENT ONYX FRONTIER 3.0X12.   Post intervention, there is a 0% residual stenosis.   In the absence of any other complications or medical issues, we expect the patient to be ready for discharge from an interventional cardiology perspective.   Recommend uninterrupted dual antiplatelet therapy with Aspirin  81mg  daily and Clopidogrel 75mg  daily for a minimum of 12 months (ACS-Class I recommendation). Conclusion -Inferior STEMI 100% occlusion distal RCA TIMI 0 flow heavy thrombus Left coronary system no significant disease Intervention Successful PCI and stent to distal RCA with overlapping 3.0 x 15 + 3.0 x 12 frontier Onyx to 15 atm Lesion reduced from TIMI 0 to TIMI 2.5 sluggish flow but much improved after thrombectomy with penumbra and stent placement We elected not to continue Aggrastat or Angiomax because of concern for groin hematoma Patient originally was given prasugrel loading dose but will transition to Plavix tomorrow so she can continue Plavix and Eliquis  which we did not know at the time that she was taken We will discontinue aspirin  prior to discharge Continue dual anticoagulant therapy for 12 months then consider discontinuing Plavix and continuing Eliquis   monotherapy Patient had significant blood loss from large right groin hematoma patient moved continuously during the case fairly obese difficult body habitus could not lie flat.  Patient also had some bleeding from her right radial side when she accidentally dislodged the sheath from her right wrist and there was significant bleeding We typed and crossed her for 2 units but did not give the order for transfusion we will going to wait till she is in the unit and reevaluate her hemoglobin level before transfusion FemoStop left in place for right groin hemostasis Patient had significant improvement in symptoms prior transferred to the unit but still had some overall confusion and agitation throughout the case Case discussed with hospitalist Will transfer cardiology care in the morning to [facility]    Cardiac Studies Left heart catheterization as stated above echo ordered and pending  Patient Profile   63 y.o. female with past medical history of paroxysmal atrial fibrillation on apixaban  status post cardioversion, stage IV pancreatic cancer with mets to the liver on active chemo, type 2 diabetes, morbid obesity, COPD, asthma, hypertension, GERD, RLS, chronic anemia, who has been seen and evaluated for recent inferior STEMI.  Assessment & Plan  Inferior STEMI -Presented with home with acute chest pain that was midsternal approximately an hour to arrival -EKG suggested ST elevation inferiorly reciprocal changes of 1 mL -High-sensitivity troponin peaked to greater than 24,000 - She was started on heparin  infusion and given aspirin  in the  emergency department and emergently taken to the cardiac Cath Lab -Mid RCA to distal RCA lesion was 100% stenosed, she had successfully placed 2 drug-eluting stents, postintervention there was 0% residual stenosis.  Left coronary system with no significant disease. -Discontinue aspirin  once apixaban  has been restarted - Continue Effient 10 mg daily for minimum of 12 months if  need be can consider changing to clopidogrel 75 mg daily in the outpatient setting -During catheterization she had significant blood loss from a large right groin hematoma -She was typed and crossed 2 units but did not receive any during the case, transfused after arrival to ICU -Echocardiogram is ordered and pending with further recommendations to follow -Continue on telemetry monitoring -EKG as needed for pain or changes  Acute anemia -Hemoglobin 7.0 trending down from arrival hemoglobin of 12 -She has been transfused 3 units of PRBCs -Consider abdominal ultrasound to evaluate concerns of retroperitoneal bleed, patient is too unstable for CT -Continue to monitor and trend CBC -Transfuse for hemoglobin 8 or less  Hypotension -Blood pressure currently 117/61 -Improving as she is receiving blood and pressors -Continue to wean and titrate Levophed, wean to off if possible -Vital signs per unit protocol  Stage IV pancreatic cancer undergoing chemo -Previously been on weekly chemo -Continue ongoing management per oncology  Type 2 diabetes -Currently on insulin  -Poorly controlled with a last A1c of 10.9 - Ongoing management per PCCM  Hypokalemia -Potassium 2.9 - Recommend keeping potassium greater than 4 less than 5 - Potassium supplements ordered - Daily BMP - Monitor/trend/replete electrolytes as needed  Paroxysmal atrial fibrillation - Currently has been maintaining sinus rhythm with sinus tach - Apixaban  remains on hold for minimum of 20 for 48 hours due to groin hematoma questionable retroperitoneal bleed and radial site oozing - Previous cardioversion completed in 12/2023 - Currently maintaining sinus rhythm - Continue with telemetry monitoring  Hypercapnic hypoxic respiratory failure -Requiring intubation - Acidotic on ABG - Currently on Precedex and pressors - Ongoing management per PCCM  Lactic acidosis/possible hemorrhagic shock - Lactic acid greater than 9 -  Symptomatic anemia - With hypotension and right groin hematoma post cath patient transfused 3 units of PRBCs - Given IV fluid - Currently on pressors -received IVF -ongoing management per PCCM    For questions or updates, please contact [facility] Please consult [email or web address] for contact info under       Signed, Harpreet Pompey, NP  06/27/2024, 8:25 AM

## 2024-06-27 NOTE — Procedures (Signed)
 Arterial Catheter Insertion Procedure Note  Kristina Cruz  [id number]  September 21, 1960  Date:06/27/24  Time:3:21 PM    Provider Performing: Inge JONETTA Lecher    Procedure: Insertion of Arterial Line (63379) with US  guidance (23062)   Indication(s) Blood pressure monitoring and/or need for frequent ABGs  Consent Unable to obtain consent due to emergent nature of procedure.  Anesthesia None   Time Out Verified patient identification, verified procedure, site/side was marked, verified correct patient position, special equipment/implants available, medications/allergies/relevant history reviewed, required imaging and test results available.   Sterile Technique Maximal sterile technique including full sterile barrier drape, hand hygiene, sterile gown, sterile gloves, mask, hair covering, sterile ultrasound probe cover (if used).   Procedure Description Area of catheter insertion was cleaned with chlorhexidine  and draped in sterile fashion. With real-time ultrasound guidance an arterial catheter was placed into the left radial artery.  Appropriate arterial tracings confirmed on monitor.     Complications/Tolerance None; patient tolerated the procedure well.   EBL Minimal   Specimen(s) None  BIOPATCH applied to the insertion site     Inge Lecher, AGACNP-BC [facility] Prefer epic messenger for cross cover needs If after hours, please call E-link

## 2024-06-27 NOTE — Progress Notes (Signed)
 Initial Nutrition Assessment  DOCUMENTATION CODES:   Morbid obesity  INTERVENTION:   Once tube feeds initiated, recommend:  Vital HP @60ml /hr- Initiate at 34ml/hr and increase by 10ml/hr q 8 hours until goal rate is reached.   Free water flushes 30ml q4 hours to maintain tube patency   Regimen provides 1440kcal/day, 126g/day protein and 1369ml/day of free water.   Pt at high refeed risk; recommend monitor potassium, magnesium  and phosphorus labs daily until stable  Daily weights  NUTRITION DIAGNOSIS:   Inadequate oral intake related to inability to eat (pt sedated and ventilated) as evidenced by NPO status.  GOAL:   Provide needs based on ASPEN/SCCM guidelines  MONITOR:   Vent status, Labs, Weight trends, Skin, I & O's  REASON FOR ASSESSMENT:   Ventilator    ASSESSMENT:   63 y/o female with h/o stage IV pancreatic cancer on chemo, RLS, COPD, GERD, morbid obesity, DM, HLD and PE who is admitted with inferior STEMI s/p PCI and DES to the distal RCA complicated by hemorrhagic shock due to bleeding from right femoral puncture site, metabolic acidosis, acute hypoxic and hypercapnic respiratory failure and acute metabolic encephalopathy requiring intubation and mechanical ventilation.  Pt sedated and ventilated. OGT in place. Plan is to hold tube feeds today per MD. Pt is likely at high refeed risk. Per chart, pt is down 46lbs(16%) over the past 9 months; this is significant weight loss.   Medications reviewed and include: aspirin , insulin , protonix , levophed, zosyn  Labs reviewed: K 3.2(L), creat 1.17(L) Wbc- 15.1(H), Hgb 8.3(L), Hct 24.6(L) Cbgs- 71, 64, 176, 226, 255, 295 x 24 hrs  AIC 6.4(H)- 11/9  Patient is currently intubated on ventilator support MV: 12 L/min Temp (24hrs), Avg:96.5 F (35.8 C), Min:91.6 F (33.1 C), Max:99.7 F (37.6 C)  MAP >39mmHg   UOP-   NUTRITION - FOCUSED PHYSICAL EXAM:  Flowsheet Row Most Recent Value  Orbital Region No  depletion  Upper Arm Region No depletion  Thoracic and Lumbar Region No depletion  Buccal Region No depletion  Temple Region No depletion  Clavicle Bone Region No depletion  Clavicle and Acromion Bone Region No depletion  Scapular Bone Region No depletion  Dorsal Hand No depletion  Patellar Region No depletion  Anterior Thigh Region No depletion  Posterior Calf Region No depletion  Edema (RD Assessment) None  Hair Reviewed  Eyes Reviewed  Mouth Reviewed  Skin Reviewed  Nails Reviewed   Diet Order:   Diet Order             Diet NPO time specified  Diet effective now                  EDUCATION NEEDS:   No education needs have been identified at this time  Skin:  Skin Assessment: Reviewed RN Assessment  Last BM:  pta  Height:   Ht Readings from Last 1 Encounters:  06/26/24 5' 1 (1.549 m)    Weight:   Wt Readings from Last 1 Encounters:  06/26/24 110.8 kg    Ideal Body Weight:  47.7 kg  BMI:  Body mass index is 46.15 kg/m.  Estimated Nutritional Needs:   Kcal:  1100-1300kcal/day  Protein:  >120g/day  Fluid:  1.5-1.7L/day  Augustin Shams MS, RD, LDN If unable to be reached, please send secure chat to RD inpatient available from 8:00a-4:00p daily

## 2024-06-27 NOTE — Consult Note (Signed)
 NAME:  Kristina Cruz, MRN:  [id number], DOB:  Oct 02, 1960, LOS: 1 ADMISSION DATE:  06/26/2024, CONSULTATION DATE:  06/27/2024 REFERRING MD:  Dr. Manfred, CHIEF COMPLAINT:  Hypotension   Brief Pt Description / Synopsis:  63 y.o. female with PMHx significant for Stage IV Pancreatic cancer undergoing chemotherapy who is admitted with inferior STEMI requiring PCI and DES to the distal RCA.  Course complicated by hemorrhagic shock due to bleeding from right femoral puncture site, metabolic acidosis, acute hypoxic and hypercapnic respiratory failure, and acute metabolic encephalopathy requiring intubation and mechanical ventilation.  History of Present Illness:  Kristina Cruz is a 63 y.o. female with a  past medical history of paroxysmal atrial fibrillation on apixaban  status post cardioversion, stage IV pancreatic cancer with mets to the liver on active chemo, type 2 diabetes, morbid obesity, COPD, asthma, hypertension, GERD, RLS, and chronic anemia who presented to [facility] ED on 06/26/24 with acute chest discomfort.  She reported the chest pain started about an hour prior to presentation (rated as 11/10 and mid sternal) and felt like an with associated shortness of breath and diaphoresis.  In the ED she was found to have an inferior STEMI with reciprocal changes.  She was given Heparin , nitroglycerin, and aspirin .  Cardiology was consulted and she was take for emergent cardiac.  She was found to have occlusion of the distal RCA requiring PCI and DES to the distal RCA.  Post cath she was noted to have bleeding for her right radial wrist site and with a right groin hematoma.  TRH asked to admit for further workup and treatment, however after arrival to ICU following cath, she was noted to be hypotensive.  PCCM was consulted.   Please see Significant Hospital Events section below for full detailed hospital course.   Pertinent  Medical History   Past Medical History:  Diagnosis Date   Anemia     Asthma    Back pain    Cataract    Diabetes mellitus without complication (HCC)    GERD (gastroesophageal reflux disease)    History of echocardiogram    Hypertension    Morbid obesity with BMI of 60.0-69.9, adult (HCC)    Restless leg syndrome     Micro Data:  11/9: MRSA PCR>> negative 11/9: Blood cultures x2>> no growth to date 11/10: Tracheal aspirate>>  Antimicrobials:  Zosyn 11/10>>   Significant Hospital Events: Including procedures, antibiotic start and stop dates in addition to other pertinent events   11/9: Presented to ED with chest pain, found to have inferior ST elevation on EKG, Cardiac consulted and emergency cath performed with PCI and stent to distal RCA.  Post cath developed right groin hematoma and hypotension concerning for possible hemorrhagic shock.  Was placed on vasopressors, PCCM consulted. 10/10: Earlier this morning became obtunded requiring intubation.  Remains critically ill, requiring Levophed.  Massive transfusion protocol initiated.   Interim History / Subjective:  As outlined above under Significant Hospital Events section  Objective   Blood pressure (!) 141/84, pulse (!) 131, temperature 97.6 F (36.4 C), temperature source Axillary, resp. rate 20, height 5' 1 (1.549 m), weight 110.8 kg, last menstrual period 01/04/2018, SpO2 96%.    Vent Mode: PRVC FiO2 (%):  [40 %-50 %] 50 % Set Rate:  [16 bmp] 16 bmp Vt Set:  [450 mL] 450 mL PEEP:  [5 cmH20] 5 cmH20 Plateau Pressure:  [20 cmH20-26 cmH20] 26 cmH20   Intake/Output Summary (Last 24 hours) at 06/27/2024 9185 Last data filed at  06/27/2024 0641 Gross per 24 hour  Intake 4325.17 ml  Output 975 ml  Net 3350.17 ml   Filed Weights   06/26/24 1514 06/26/24 1834 06/26/24 2010  Weight: 111.6 kg 108.5 kg 110.8 kg    Examination: General: Critically ill-appearing female, laying in bed, intubated and sedated, no acute distress HENT: Atraumatic, normocephalic, neck supple, difficult to assess  JVD due to body habitus, orally intubated Lungs: Mechanical breath sounds throughout, even, nonlabored, occasionally overbreathing the vent Cardiovascular: Regular rate and rhythm, S1-S2, no murmurs, rubs, gallops Abdomen: Abdominal exam is limited due to intubation sedation: Obese, soft, nontender, nondistended, no guarding or tenderness, bowel sounds plus x 4 Extremities: Normal bulk and tone, no deformities Neuro: Sedated, withdraws from painful stimuli, currently does not follow commands, PERRLA GU: Foley catheter in place draining yellow urine  Resolved Hospital Problem list     Assessment & Plan:   #Shock: Hemorrhagic +/- Cardiogenic  #Inferior STEMI #Paroxsymal Atrial Fibrillation PMHx: HTN -Echocardiogram 06/27/24: LVEF 45-50%, indeterminate diastolic parameters, RV systolic function is normal, RV size is normal -Continuous cardiac monitoring -Maintain MAP >65 -Cautious IV fluids -Transfusions as indicated -Vasopressors as needed to maintain MAP goal -Trend lactic acid until normalized -Trend HS Troponin until peaked -Diuresis as BP and renal function permits ~ holding due to shock  -Cardiology following, appreciate input ~ Continue ASA and Prasugrel  -Unable to anticoagulate for now due to groin hematoma   #Acute Hypoxic Respiratory Failure PMHx: COPD, asthma  -Full vent support, implement lung protective strategies -Plateau pressures less than 30 cm H20 -Wean FiO2 & PEEP as tolerated to maintain O2 sats >92% -Follow intermittent Chest X-ray & ABG as needed -Spontaneous Breathing Trials when respiratory parameters met and mental status permits -Implement VAP Bundle -Prn Bronchodilators  #Acute Blood Loss Anemia #Thrombocytopenia -Monitor for S/Sx of bleeding -Trend CBC -SCD's for VTE Prophylaxis  -Transfuse for Hgb <8 -Transfuse Platelets for Platelet count < 10K; < 50K with active bleeding; < 100K with Neurosurgical procedures/processes  -Once stable enough to  transfer off of unit will obtain CT Abdomen & Pelvis to rule out retroperitoneal hematoma -Check DIC panel ~ Smear negative for schistocytes  #Leukocytosis, suspect reactive  #Questionable aspiration  -Monitor fever curve -Trend WBC's & Procalcitonin -Follow cultures as above -Start empiric Zosyn pending cultures & sensitivities   #AKI #Anion Gap Metabolic Acidosis due to Lactic Acidosis  #Hypokalemia -Monitor I&O's / urinary output -Follow BMP -Ensure adequate renal perfusion -Avoid nephrotoxic agents as able -Replace electrolytes as indicated ~ Pharmacy following for assistance with electrolyte replacement  #Diabetes Mellitus  -CBG's q4h; Target range of 140 to 180 -SSI -Follow ICU Hypo/Hyperglycemia protocol  #Acute Metabolic Encephalopathy #Sedation needs in setting of mechanical ventilation  -Maintain a RASS goal of 0 to -1 -Precedex to maintain RASS goal -Avoid sedating medications as able -Daily wake up assessment     Best Practice (right click and Reselect all SmartList Selections daily)   Diet/type: NPO DVT prophylaxis: SCD GI prophylaxis: PPI Lines: Central line and yes and it is still needed Foley:  Yes, and it is still needed Code Status:  full code Last date of multidisciplinary goals of care discussion [11/10]  11/10: Pt's daughter updated via telephone on plan of care.  Labs   CBC: Recent Labs  Lab 06/26/24 1510 06/26/24 1850 06/27/24 0320  WBC 9.4 12.7* 20.9*  NEUTROABS 7.6 10.5*  --   HGB 12.0 8.2* 7.0*  HCT 36.3 25.1* 22.0*  MCV 94.5 96.2 99.1  PLT  133* 171 157    Basic Metabolic Panel: Recent Labs  Lab 06/26/24 1510 06/26/24 1850 06/26/24 2302 06/27/24 0320  NA 132* 132* 131* 132*  K 3.4* 3.4* 3.9 4.2  CL 95* 97* 99 99  CO2 23 19* 10* 12*  GLUCOSE 463* 541* 645* 572*  BUN 19 21 20 23   CREATININE 0.93 0.99 1.16* 1.34*  CALCIUM  8.5* 7.5* 7.2* 7.7*  MG  --  2.2  --   --    GFR: Estimated Creatinine Clearance: 48.2 mL/min  (A) (by C-G formula based on SCr of 1.34 mg/dL (H)). Recent Labs  Lab 06/26/24 1510 06/26/24 1844 06/26/24 1850 06/26/24 2302 06/27/24 0320  WBC 9.4  --  12.7*  --  20.9*  LATICACIDVEN 4.8* 7.0*  --  >9.0* >9.0*    Liver Function Tests: Recent Labs  Lab 06/26/24 1510 06/26/24 1850  AST 42* 39  ALT 32 25  ALKPHOS 222* 152*  BILITOT 1.7* 1.5*  PROT 8.2* 5.8*  ALBUMIN  3.5 2.5*   No results for input(s): LIPASE, AMYLASE in the last 168 hours. No results for input(s): AMMONIA in the last 168 hours.  ABG    Component Value Date/Time   PHART 7.29 (L) 06/27/2024 0629   PCO2ART 33 06/27/2024 0629   PO2ART 131 (H) 06/27/2024 0629   HCO3 15.9 (L) 06/27/2024 0629   ACIDBASEDEF 9.6 (H) 06/27/2024 0629   O2SAT 99.5 06/27/2024 0629     Coagulation Profile: Recent Labs  Lab 06/26/24 1510 06/26/24 1850  INR 1.4* 4.1*    Cardiac Enzymes: No results for input(s): CKTOTAL, CKMB, CKMBINDEX, TROPONINI in the last 168 hours.  HbA1C: Hgb A1c MFr Bld  Date/Time Value Ref Range Status  06/26/2024 03:10 PM 6.4 (H) 4.8 - 5.6 % Final    Comment:    (NOTE) Diagnosis of Diabetes The following HbA1c ranges recommended by the American Diabetes Association (ADA) may be used as an aid in the diagnosis of diabetes mellitus.  Hemoglobin             Suggested A1C NGSP%              Diagnosis  <5.7                   Non Diabetic  5.7-6.4                Pre-Diabetic  >6.4                   Diabetic  <7.0                   Glycemic control for                       adults with diabetes.    12/25/2023 10:00 PM 10.9 (H) 4.8 - 5.6 % Final    Comment:    (NOTE) Pre diabetes:          5.7%-6.4%  Diabetes:              >6.4%  Glycemic control for   <7.0% adults with diabetes     CBG: Recent Labs  Lab 06/27/24 0429 06/27/24 0456 06/27/24 0553 06/27/24 0622 06/27/24 0729  GLUCAP 435* 435* 376* 331* 295*    Review of Systems:   Unable to assess due to  intubation/sedation/critical illness    Past Medical History:  She,  has a past medical history of Anemia, Asthma, Back pain, Cataract, Diabetes mellitus  without complication (HCC), GERD (gastroesophageal reflux disease), History of echocardiogram, Hypertension, Morbid obesity with BMI of 60.0-69.9, adult (HCC), and Restless leg syndrome.   Surgical History:   Past Surgical History:  Procedure Laterality Date   ABDOMINAL HYSTERECTOMY     CARDIOVERSION N/A 12/28/2023   Procedure: CARDIOVERSION;  Surgeon: Darliss Rogue, MD;  Location: [facility] ORS;  Service: Cardiovascular;  Laterality: N/A;   CATARACT EXTRACTION W/PHACO Left 04/13/2020   Procedure: CATARACT EXTRACTION PHACO AND INTRAOCULAR LENS PLACEMENT (IOC);  Surgeon: Ferol Rogue, MD;  Location: [facility] ORS;  Service: Ophthalmology;  Laterality: Left;  US  00:36.0 CDE 5.95 Fluid Pack lot # [id number]   CORONARY THROMBECTOMY N/A 06/26/2024   Procedure: Coronary Thrombectomy;  Surgeon: Florencio Cara BIRCH, MD;  Location: [facility] INVASIVE CV LAB;  Service: Cardiovascular;  Laterality: N/A;   CORONARY/GRAFT ACUTE MI REVASCULARIZATION N/A 06/26/2024   Procedure: Coronary/Graft Acute MI Revascularization;  Surgeon: Florencio Cara BIRCH, MD;  Location: [facility] INVASIVE CV LAB;  Service: Cardiovascular;  Laterality: N/A;   HYSTEROSCOPY WITH D & C N/A 07/14/2017   Procedure: DILATATION AND CURETTAGE /HYSTEROSCOPY;  Surgeon: Arloa Lamar SQUIBB, MD;  Location: [facility] ORS;  Service: Gynecology;  Laterality: N/A;   IR IMAGING GUIDED PORT INSERTION  12/17/2023   LEFT HEART CATH AND CORONARY ANGIOGRAPHY N/A 06/26/2024   Procedure: LEFT HEART CATH AND CORONARY ANGIOGRAPHY;  Surgeon: Florencio Cara BIRCH, MD;  Location: [facility] INVASIVE CV LAB;  Service: Cardiovascular;  Laterality: N/A;   POLYPECTOMY  2015   TEE WITHOUT CARDIOVERSION N/A 12/28/2023   Procedure: ECHOCARDIOGRAM, TRANSESOPHAGEAL;  Surgeon: Darliss Rogue, MD;  Location: [facility] ORS;  Service: Cardiovascular;   Laterality: N/A;     Social History:   reports that she has never smoked. She has never used smokeless tobacco. She reports that she does not drink alcohol and does not use drugs.   Family History:  Her family history includes Breast cancer (age of onset: 9) in her mother; Diabetes in her father and mother; Hypertension in her father and mother; Ovarian cancer in her paternal aunt.   Allergies Allergies  Allergen Reactions   Peanut-Containing Drug Products      Home Medications  Prior to Admission medications   Medication Sig Start Date End Date Taking? Authorizing Provider  acetaminophen  (TYLENOL  8 HOUR ARTHRITIS PAIN) 650 MG CR tablet Take 3 tablets (1,950 mg total) by mouth every 8 (eight) hours as needed for pain. Do not exceed 4000 mg total in a day.  Home med. 04/21/23   Awanda City, MD  amLODipine  (NORVASC ) 10 MG tablet Take 10 mg by mouth daily. 03/28/24   [provider]  apixaban  (ELIQUIS ) 5 MG TABS tablet Take 1 tablet (5 mg total) by mouth 2 (two) times daily. 05/19/24   Babara Call, MD  azelastine  (ASTELIN ) 0.1 % nasal spray Place 1 spray into both nostrils 2 (two) times daily. 10/22/23   [provider]  baclofen  (LIORESAL ) 10 MG tablet Take 10 mg by mouth 3 (three) times daily.    [provider]  dexamethasone  (DECADRON ) 4 MG tablet Take 1 tablet (4 mg total) by mouth daily. Start the day after irinotecan chemotherapy for 2 days. Take with food. 06/16/24   Babara Call, MD  diphenoxylate -atropine  (LOMOTIL ) 2.5-0.025 MG tablet Take 2 tablets by mouth every 6 (six) hours as needed (for diarrhea unrelieved by imodium). 12/21/23   Dasie Tinnie MATSU, NP  EPINEPHrine  0.3 mg/0.3 mL IJ SOAJ injection Inject 0.3 mg into the muscle as needed for anaphylaxis. 09/19/20  Angelena Smalls, MD  fluticasone  (FLONASE ) 50 MCG/ACT nasal spray Place 1 spray into both nostrils daily. 09/29/23   Patel, Sona, MD  fluticasone  furoate-vilanterol (BREO ELLIPTA ) 100-25 MCG/ACT AEPB Inhale 1  puff into the lungs daily. 09/29/23   Patel, Sona, MD  furosemide  (LASIX ) 40 MG tablet Take 40 mg by mouth daily. 03/23/20   [provider]  gabapentin  (NEURONTIN ) 300 MG capsule Take 1 capsule (300 mg total) by mouth 2 (two) times daily. 04/20/24   Borders, Fonda SAUNDERS, NP  ipratropium-albuterol  (DUONEB) 0.5-2.5 (3) MG/3ML SOLN Take 3 mLs by nebulization every 4 (four) hours as needed (wheezing / shortness of breath). 09/29/23   Patel, Sona, MD  levocetirizine (XYZAL ) 5 MG tablet Take 5 mg by mouth daily. 10/21/23   [provider]  lidocaine -prilocaine  (EMLA ) cream Apply 1 Application topically as needed. Apply to port and cover with saran wrap 1-2 hours prior to port access 01/08/24   Babara Call, MD  loperamide (IMODIUM) 2 MG capsule Take 1 capsule (2 mg total) by mouth See admin instructions. Initial: 4 mg with onset of diarrhea, then 2 mg every 2 hours, or after each unformed stool. Maximum: 16 mg/day 06/16/24   Yu, Zhou, MD  magic mouthwash (lidocaine , diphenhydrAMINE , alum & mag hydroxide) suspension Swish and swallow 5 mLs 4 (four) times daily as needed for mouth pain. 12/18/23   Dasie Tinnie MATSU, NP  metFORMIN  (GLUCOPHAGE ) 1000 MG tablet Take 1,000 mg by mouth 2 (two) times daily. 11/27/22   [provider]  metoprolol  tartrate (LOPRESSOR ) 25 MG tablet Take 1 tablet (25 mg total) by mouth 2 (two) times daily. 12/30/23 06/16/24  Trudy Anthony HERO, MD  morphine  (MS CONTIN ) 15 MG 12 hr tablet Take 1 tablet (15 mg total) by mouth every 12 (twelve) hours. 05/04/24   Borders, Fonda SAUNDERS, NP  naloxone  (NARCAN ) nasal spray 4 mg/0.1 mL SPRAY 1 SPRAY INTO ONE NOSTRIL AS DIRECTED FOR OPIOID OVERDOSE (TURN PERSON ON SIDE AFTER DOSE. IF NO RESPONSE IN 2-3 MINUTES OR PERSON RESPONDS BUT RELAPSES, REPEAT USING A NEW SPRAY DEVICE AND SPRAY INTO THE OTHER NOSTRIL. CALL 911 AFTER USE.) * EMERGENCY USE ONLY * Patient not taking: Reported on 06/16/2024 11/20/23   Borders, Fonda SAUNDERS, NP  ondansetron   (ZOFRAN -ODT) 8 MG disintegrating tablet Take 1 tablet (8 mg total) by mouth every 8 (eight) hours as needed for nausea or vomiting. Do not take within 72 hours of chemotherapy. 06/16/24   Babara Call, MD  oxyCODONE  (OXY IR/ROXICODONE ) 5 MG immediate release tablet Take 1 tablet (5 mg total) by mouth every 4 (four) hours as needed for moderate pain (pain score 4-6) or severe pain (pain score 7-10). 06/02/24   Babara Call, MD  potassium chloride  (KLOR-CON  M) 10 MEQ tablet Take 1 tablet (10 mEq total) by mouth daily. 05/19/24   Babara Call, MD  prochlorperazine  (COMPAZINE ) 10 MG tablet Take 1 tablet (10 mg total) by mouth every 6 (six) hours as needed for nausea or vomiting. 06/16/24   Babara Call, MD  rOPINIRole  (REQUIP ) 2 MG tablet Take 2 mg by mouth in the morning and at bedtime.     [provider]  spironolactone  (ALDACTONE ) 50 MG tablet Take 1 tablet (50 mg total) by mouth daily. 02/01/24   Amin, Sumayya, MD  traZODone  (DESYREL ) 50 MG tablet Take 0.5-1 tablets (25-50 mg total) by mouth at bedtime as needed for sleep. 01/08/24   Borders, Fonda SAUNDERS, NP  VENTOLIN  HFA 108 (90 Base) MCG/ACT inhaler Inhale  2 puffs into the lungs every 4 (four) hours as needed for wheezing or shortness of breath. 09/29/23   Patel, Sona, MD     Critical care time: 45 minutes      Inge Lecher, AGACNP-BC [facility] Prefer epic messenger for cross cover needs If after hours, please call E-link

## 2024-06-28 ENCOUNTER — Inpatient Hospital Stay

## 2024-06-28 DIAGNOSIS — R578 Other shock: Secondary | ICD-10-CM

## 2024-06-28 DIAGNOSIS — J9601 Acute respiratory failure with hypoxia: Secondary | ICD-10-CM | POA: Diagnosis not present

## 2024-06-28 DIAGNOSIS — I2111 ST elevation (STEMI) myocardial infarction involving right coronary artery: Secondary | ICD-10-CM | POA: Diagnosis not present

## 2024-06-28 DIAGNOSIS — I480 Paroxysmal atrial fibrillation: Secondary | ICD-10-CM | POA: Diagnosis not present

## 2024-06-28 DIAGNOSIS — I509 Heart failure, unspecified: Secondary | ICD-10-CM

## 2024-06-28 DIAGNOSIS — R571 Hypovolemic shock: Secondary | ICD-10-CM | POA: Diagnosis not present

## 2024-06-28 DIAGNOSIS — G928 Other toxic encephalopathy: Secondary | ICD-10-CM

## 2024-06-28 DIAGNOSIS — G929 Unspecified toxic encephalopathy: Secondary | ICD-10-CM | POA: Diagnosis not present

## 2024-06-28 LAB — TYPE AND SCREEN
ABO/RH(D): O POS
Antibody Screen: NEGATIVE
Unit division: 0
Unit division: 0
Unit division: 0
Unit division: 0
Unit division: 0
Unit division: 0
Unit division: 0
Unit division: 0

## 2024-06-28 LAB — PREPARE CRYOPRECIPITATE
Unit division: 0
Unit division: 0

## 2024-06-28 LAB — PREPARE FRESH FROZEN PLASMA
Unit division: 0
Unit division: 0
Unit division: 0
Unit division: 0

## 2024-06-28 LAB — BPAM FFP
Blood Product Expiration Date: 202511152359
Blood Product Expiration Date: 202511152359
Blood Product Expiration Date: 202511152359
Blood Product Expiration Date: 202511152359
Blood Product Expiration Date: 202511252359
Blood Product Expiration Date: 202511252359
Blood Product Expiration Date: 202511252359
Blood Product Expiration Date: 202511252359
ISSUE DATE / TIME: 202511100833
ISSUE DATE / TIME: 202511100833
ISSUE DATE / TIME: 202511100833
ISSUE DATE / TIME: 202511100833
ISSUE DATE / TIME: 202511101800
Unit Type and Rh: 5100
Unit Type and Rh: 5100
Unit Type and Rh: 5100
Unit Type and Rh: 6200
Unit Type and Rh: 6200
Unit Type and Rh: 6200
Unit Type and Rh: 6200
Unit Type and Rh: 9500

## 2024-06-28 LAB — RENAL FUNCTION PANEL
Albumin: 2.3 g/dL — ABNORMAL LOW (ref 3.5–5.0)
Anion gap: 12 (ref 5–15)
BUN: 26 mg/dL — ABNORMAL HIGH (ref 8–23)
CO2: 23 mmol/L (ref 22–32)
Calcium: 7.7 mg/dL — ABNORMAL LOW (ref 8.9–10.3)
Chloride: 103 mmol/L (ref 98–111)
Creatinine, Ser: 1.42 mg/dL — ABNORMAL HIGH (ref 0.44–1.00)
GFR, Estimated: 41 mL/min — ABNORMAL LOW (ref >60)
Glucose, Bld: 200 mg/dL — ABNORMAL HIGH (ref 70–99)
Phosphorus: 2.4 mg/dL — ABNORMAL LOW (ref 2.5–4.6)
Potassium: 4.3 mmol/L (ref 3.5–5.1)
Sodium: 138 mmol/L (ref 135–145)

## 2024-06-28 LAB — GLUCOSE, CAPILLARY
Glucose-Capillary: 110 mg/dL — ABNORMAL HIGH (ref 70–99)
Glucose-Capillary: 117 mg/dL — ABNORMAL HIGH (ref 70–99)
Glucose-Capillary: 171 mg/dL — ABNORMAL HIGH (ref 70–99)
Glucose-Capillary: 193 mg/dL — ABNORMAL HIGH (ref 70–99)
Glucose-Capillary: 160 mg/dL — ABNORMAL HIGH (ref 70–99)
Glucose-Capillary: 137 mg/dL — ABNORMAL HIGH (ref 70–99)

## 2024-06-28 LAB — BPAM RBC
Blood Product Expiration Date: 202511102359
Blood Product Expiration Date: 202512042359
Blood Product Expiration Date: 202512082359
Blood Product Expiration Date: 202512082359
Blood Product Expiration Date: 202512082359
Blood Product Expiration Date: 202512082359
Blood Product Expiration Date: 202512082359
Blood Product Expiration Date: 202512092359
ISSUE DATE / TIME: 202511100833
ISSUE DATE / TIME: 202511100833
ISSUE DATE / TIME: 202511100833
ISSUE DATE / TIME: 202511100833
ISSUE DATE / TIME: 202511101540
Unit Type and Rh: 5100
Unit Type and Rh: 5100
Unit Type and Rh: 5100
Unit Type and Rh: 5100
Unit Type and Rh: 5100
Unit Type and Rh: 5100
Unit Type and Rh: 5100
Unit Type and Rh: 9500

## 2024-06-28 LAB — BPAM CRYOPRECIPITATE
Blood Product Expiration Date: 202511102233
Blood Product Expiration Date: 202511102311
ISSUE DATE / TIME: 202511101657
ISSUE DATE / TIME: 202511101728
Unit Type and Rh: 5100
Unit Type and Rh: 5100

## 2024-06-28 LAB — PREPARE PLATELET PHERESIS: Unit division: 0

## 2024-06-28 LAB — CBC
HCT: 28.1 % — ABNORMAL LOW (ref 36.0–46.0)
Hemoglobin: 10.3 g/dL — ABNORMAL LOW (ref 12.0–15.0)
MCH: 30.7 pg (ref 26.0–34.0)
MCHC: 36.7 g/dL — ABNORMAL HIGH (ref 30.0–36.0)
MCV: 83.6 fL (ref 80.0–100.0)
Platelets: 49 K/uL — ABNORMAL LOW (ref 150–400)
RBC: 3.36 MIL/uL — ABNORMAL LOW (ref 3.87–5.11)
RDW: 15.9 % — ABNORMAL HIGH (ref 11.5–15.5)
WBC: 17.2 K/uL — ABNORMAL HIGH (ref 4.0–10.5)
nRBC: 0.5 % — ABNORMAL HIGH (ref 0.0–0.2)

## 2024-06-28 LAB — BLOOD GAS, ARTERIAL
Acid-Base Excess: 2.3 mmol/L — ABNORMAL HIGH (ref 0.0–2.0)
Bicarbonate: 23.9 mmol/L (ref 20.0–28.0)
FIO2: 40 %
MECHVT: 450 mL
O2 Saturation: 98.1 %
PEEP: 5 cmH2O
Patient temperature: 37
RATE: 20 {breaths}/min
pCO2 arterial: 28 mmHg — ABNORMAL LOW (ref 32–48)
pH, Arterial: 7.54 — ABNORMAL HIGH (ref 7.35–7.45)
pO2, Arterial: 92 mmHg (ref 83–108)

## 2024-06-28 LAB — BLOOD GAS, VENOUS
Bicarbonate: 15.7 mmol/L — ABNORMAL LOW (ref 20.0–28.0)
O2 Saturation: 28.7 % — AB (ref 0.0–2.0)
Patient temperature: 37
Patient temperature: 37 %
pCO2, Ven: 32 mmHg — ABNORMAL LOW (ref 44–60)
pH, Ven: 7.17 — CL (ref 7.25–7.43)
pO2, Ven: <31 mmHg — CL (ref 32–45)

## 2024-06-28 LAB — BPAM PLATELET PHERESIS
Blood Product Expiration Date: 202511122359
ISSUE DATE / TIME: 202511100925
Unit Type and Rh: 202511122359
Unit Type and Rh: 6200

## 2024-06-28 LAB — MAGNESIUM: Magnesium: 1.8 mg/dL (ref 1.7–2.4)

## 2024-06-28 LAB — FIBRINOGEN: Fibrinogen: 292 mg/dL (ref 210–475)

## 2024-06-28 LAB — PROTIME-INR
INR: 1.7 — ABNORMAL HIGH (ref 0.8–1.2)
Prothrombin Time: 20.9 s — ABNORMAL HIGH (ref 11.4–15.2)

## 2024-06-28 LAB — HEMOGLOBIN AND HEMATOCRIT, BLOOD
HCT: 28.2 % — ABNORMAL LOW (ref 36.0–46.0)
Hemoglobin: 10 g/dL — ABNORMAL LOW (ref 12.0–15.0)

## 2024-06-28 LAB — LIPOPROTEIN A (LPA): Lipoprotein (a): 33.9 nmol/L — ABNORMAL HIGH (ref <75.0)

## 2024-06-28 LAB — PREPARE RBC (CROSSMATCH)

## 2024-06-28 MED ORDER — POTASSIUM & SODIUM PHOSPHATES 280-160-250 MG PO PACK
2.0000 | PACK | Freq: Once | ORAL | Status: AC
  Administered 2024-06-28: 2
  Filled 2024-06-28: qty 2

## 2024-06-28 MED ORDER — FREE WATER
30.0000 mL | Status: DC
  Administered 2024-06-28 – 2024-07-01 (×15): 30 mL

## 2024-06-28 MED ORDER — K PHOS MONO-SOD PHOS DI & MONO 155-852-130 MG PO TABS
500.0000 mg | ORAL_TABLET | Freq: Once | ORAL | Status: DC
  Filled 2024-06-28: qty 2

## 2024-06-28 MED ORDER — ACETAMINOPHEN 325 MG PO TABS
650.0000 mg | ORAL_TABLET | ORAL | Status: DC | PRN
  Administered 2024-06-28 – 2024-06-29 (×2): 650 mg via ORAL
  Filled 2024-06-28 (×2): qty 2

## 2024-06-28 MED ORDER — VITAL HP 1.0 CAL PO LIQD
1000.0000 mL | ORAL | Status: DC
  Administered 2024-06-28 – 2024-07-01 (×4): 1000 mL

## 2024-06-28 MED ORDER — MAGNESIUM SULFATE 2 GM/50ML IV SOLN
2.0000 g | Freq: Once | INTRAVENOUS | Status: AC
  Administered 2024-06-28: 2 g via INTRAVENOUS
  Filled 2024-06-28: qty 50

## 2024-06-28 NOTE — Care Management Important Message (Signed)
 Important Message  Patient Details  Name: Kristina Cruz MRN: [id number] Date of Birth: 1961/01/23   Important Message Given:  Yes - Medicare IM     Rojelio SHAUNNA Rattler 06/28/2024, 12:36 PM

## 2024-06-28 NOTE — Consult Note (Addendum)
 PHARMACY CONSULT NOTE - FOLLOW UP  Pharmacy Consult for Electrolyte Monitoring and Replacement   Recent Labs: Potassium (mmol/L)  Date Value  06/28/2024 4.3  04/10/2014 3.4 (L)   Magnesium  (mg/dL)  Date Value  88/88/7974 1.8   Calcium  (mg/dL)  Date Value  88/88/7974 7.7 (L)   Calcium , Total (mg/dL)  Date Value  91/75/7984 8.8   Albumin  (g/dL)  Date Value  88/88/7974 2.3 (L)  04/10/2014 3.1 (L)   Phosphorus (mg/dL)  Date Value  88/88/7974 2.4 (L)   Sodium (mmol/L)  Date Value  06/28/2024 138  04/10/2014 143     Assessment: Kristina Cruz is a 60 yoF that presented with chest pain. Past medical history is notable for diabetes, hypertension, and morbid obesity.  Goal of Therapy: electrolytes wnl  Plan:  K wnl Phos 2.4 -> ordered Phos-Nak two tabs x1 per tube Mg 1.8 -> ordered Mg 2g IV x1 Monitor with AM labs  Leonor JAYSON Argyle ,PharmD 06/28/2024 8:37 AM

## 2024-06-28 NOTE — Progress Notes (Addendum)
 Progress Note  Patient Name: Kristina Cruz Date of Encounter: 06/28/2024 [facility] Cardiologist: Timothy Gollan, MD   Interval Summary   Patient remains intubated and sedated.  Pressor requirements have improved, allowing for the patient to undergo CT of the abdomen and pelvis yesterday.  This was negative for groin/retroperitoneal hematoma.  Known pancreatic tail mass and innumerable hepatic metastases were again seen.  Hemoglobin has stabilized after PRBC transfusions yesterday.  Serum lactic acid also trending down, most recently 3.7 yesterday evening.  Vital Signs Vitals:   06/28/24 0645 06/28/24 0700 06/28/24 0715 06/28/24 0730  BP:      Pulse: 70 72 71 72  Resp: (!) 25 (!) 23 (!) 26 (!) 23  Temp: (!) 100.4 F (38 C) (!) 100.4 F (38 C) (!) 100.4 F (38 C) (!) 100.4 F (38 C)  TempSrc:      SpO2: 97% 98% 98% 98%  Weight:      Height:        Intake/Output Summary (Last 24 hours) at 06/28/2024 0952 Last data filed at 06/28/2024 0540 Gross per 24 hour  Intake 2685.66 ml  Output 1647 ml  Net 1038.66 ml      06/28/2024    3:15 AM 06/26/2024    8:10 PM 06/26/2024    6:34 PM  Last 3 Weights  Weight (lbs) 253 lb 8.5 oz 244 lb 4.3 oz 239 lb 3.2 oz  Weight (kg) 115 kg 110.8 kg 108.5 kg      Telemetry/ECG  Normal sinus rhythm and sinus tachycardia (heart rates 80-110 bpm) - Personally Reviewed  Physical Exam  GEN: Intubated and unresponsive to verbal and tactile stimuli. Neck: Able to assess JVP due to body habitus and support devices. Cardiac: Regular rate and rhythm without murmurs. Respiratory: Coarse breath sounds anteriorly. GI: Soft, nontender, non-distended  MS: No edema.  Right groin arteriotomy site without hematoma.  Right radial arteriotomy site without hematoma.  Assessment & Plan  STEMI: Patient initially presented with inferior STEMI 2 days ago and underwent challenging intervention to the distal RCA.  Case was complicated by acute  blood loss anemia thought to be due to dislodgment of the right radial artery sheath and subsequent hemorrhage.  She also had a reported right groin hematoma, though physical exam and yesterday's CT do not show significant hematoma near the right femoral arteriotomy. -Continue aspirin  and for now.  Will likely need to transition to clopidogrel prior to discharge given history of PAF (if decision is made to resume apixaban ). -Unable to add beta-blocker in the setting of shock.  Heart failure with mildly reduced ejection fraction: LVEF 45-50% noted on yesterday's echocardiogram.  Question some element of RV dysfunction as well. - Consider adding GDMT once shock has resolved; patient remains on vasopressors at this time precluding addition of any agents. - Maintain net even fluid balance.  Acute blood loss anemia: Likely iatrogenic from dislodgment of radial artery sheath and ensuing hemorrhage.  Small right groin hematoma also reportedly present though exam today and yesterday's CTA did not show significant groin hematoma formation.  Hemoglobin stable today.  Continue monitoring with further PRBC transfusions to maintain hemoglobin greater than 8.  Antiplatelet therapy should not be discontinued, as the patient would be high risk for stent thrombosis in the setting of recent STEMI and placement of 2 overlapping drug-eluting stents in the distal RCA.  Shock: Likely a combination of hemorrhagic and cardiogenic.  Echocardiogram yesterday showed mildly reduced LVEF.  I also suspect she may have  an element of RV dysfunction.  Continue weaning of norepinephrine as tolerated.  Maintain net even fluid balance.  Paroxysmal atrial fibrillation: No evidence of atrial fibrillation thus far.  Will need to readdress role for long-term anticoagulation prior to discharge, as this would affect our antiplatelet strategy going forward, particularly in light of her acute blood loss anemia this admission.  Metastatic  pancreatic cancer: Grossly stable on noncontrasted CT of the abdomen and pelvis yesterday.   For questions or updates, please contact [facility] Please consult [email or web address] for contact info under [facility].   CRITICAL CARE TIME Performed by: Lonni Hanson, MD   Total critical care time: 30 minutes  Critical care time was exclusive of separately billable procedures and treating other patients.  Critical care was necessary to treat or prevent imminent or life-threatening deterioration.  Critical care was time spent personally by me on the following activities: development of treatment plan with patient and/or surrogate as well as nursing, discussions with consultants, evaluation of patient's response to treatment, examination of patient, obtaining history from patient or surrogate, ordering and performing treatments and interventions, ordering and review of laboratory studies, ordering and review of radiographic studies, pulse oximetry and re-evaluation of patient's condition.  Signed, Lonni Hanson, MD

## 2024-06-28 NOTE — Progress Notes (Signed)
 Uneventful day. Failed SBT. No family in today.

## 2024-06-28 NOTE — Progress Notes (Signed)
 NAME:  Kristina Cruz, MRN:  [id number], DOB:  07/11/1961, LOS: 2 ADMISSION DATE:  06/26/2024, CONSULTATION DATE:  06/27/2024 REFERRING MD:  Dr. Manfred, CHIEF COMPLAINT:  Hypotension   Brief Pt Description / Synopsis:  63 y.o. female with PMHx significant for Stage IV Pancreatic cancer undergoing chemotherapy who is admitted with inferior STEMI requiring PCI and DES to the distal RCA.  Course complicated by hemorrhagic shock due to bleeding from right femoral/radial cath puncture sites, metabolic acidosis, acute hypoxic respiratory failure, and acute metabolic encephalopathy requiring intubation and mechanical ventilation.  History of Present Illness:  Kristina Cruz is a 63 y.o. female with a  past medical history of paroxysmal atrial fibrillation on apixaban  status post cardioversion, stage IV pancreatic cancer with mets to the liver on active chemo, type 2 diabetes, morbid obesity, COPD, asthma, hypertension, GERD, RLS, and chronic anemia who presented to [facility] ED on 06/26/24 with acute chest discomfort.  She reported the chest pain started about an hour prior to presentation (rated as 11/10 and mid sternal) and felt like an with associated shortness of breath and diaphoresis.  In the ED she was found to have an inferior STEMI with reciprocal changes.  She was given Heparin , nitroglycerin, and aspirin .  Cardiology was consulted and she was take for emergent cardiac.  She was found to have occlusion of the distal RCA requiring PCI and DES to the distal RCA.  Post cath she was noted to have bleeding for her right radial wrist site and with a right groin hematoma.  TRH asked to admit for further workup and treatment, however after arrival to ICU following cath, she was noted to be hypotensive.  PCCM was consulted.   Please see Significant Hospital Events section below for full detailed hospital course.   Pertinent  Medical History   Past Medical History:  Diagnosis Date   Anemia     Asthma    Back pain    Cataract    Diabetes mellitus without complication (HCC)    GERD (gastroesophageal reflux disease)    History of echocardiogram    Hypertension    Morbid obesity with BMI of 60.0-69.9, adult (HCC)    Restless leg syndrome     Micro Data:  11/9: MRSA PCR>> negative 11/9: Blood cultures x2>> no growth to date 11/10: Tracheal aspirate>> gram + cocci  Antimicrobials:   Anti-infectives (From admission, onward)    Start     Dose/Rate Route Frequency Ordered Stop   06/27/24 1645  piperacillin-tazobactam (ZOSYN) IVPB 3.375 g        3.375 g 12.5 mL/hr over 240 Minutes Intravenous Every 8 hours 06/27/24 1554          Significant Hospital Events: Including procedures, antibiotic start and stop dates in addition to other pertinent events   11/9: Presented to ED with chest pain, found to have inferior ST elevation on EKG, Cardiac consulted and emergency cath performed with PCI and stent to distal RCA.  Post cath developed right groin hematoma and hypotension concerning for possible hemorrhagic shock.  Was placed on vasopressors, PCCM consulted. 10/10: Earlier this morning became obtunded requiring intubation.  Remains critically ill, requiring Levophed.  Massive transfusion protocol initiated (received 4 pRBC's, 2 FFP, 2 Cryo).  CT Abdomen & Pelvis without evidence of retroperitoneal bleed, did show mild stranding/hemorrhage in right inguinal region and medial right upper thigh. 11/11: No significant events overnight.  Hgb stable overnight, no additional blood products required.  Weaning Levophed (down to 16 mcg currently).  Fever overnight (T Max 101.6), Tracheal aspirate with gram + cocci, will continue Zosyn for now.  On minimal vent support, plan for WUA/SBT later today as tolerated.   Interim History / Subjective:  As outlined above under Significant Hospital Events section  Objective   Blood pressure 127/63, pulse 70, temperature (!) 100.4 F (38 C), resp.  rate (!) 25, height 5' 1 (1.549 m), weight 115 kg, last menstrual period 01/04/2018, SpO2 97%.    Vent Mode: PRVC FiO2 (%):  [40 %-50 %] 40 % Set Rate:  [16 bmp-20 bmp] 20 bmp Vt Set:  [450 mL] 450 mL PEEP:  [5 cmH20] 5 cmH20 Plateau Pressure:  [26 cmH20] 26 cmH20   Intake/Output Summary (Last 24 hours) at 06/28/2024 9272 Last data filed at 06/28/2024 0540 Gross per 24 hour  Intake 3639.66 ml  Output 1737 ml  Net 1902.66 ml   Filed Weights   06/26/24 1834 06/26/24 2010 06/28/24 0315  Weight: 108.5 kg 110.8 kg 115 kg    Examination: General: Critically ill-appearing female, laying in bed, intubated and sedated, no acute distress HENT: Atraumatic, normocephalic, neck supple, difficult to assess JVD due to body habitus, orally intubated Lungs: Mechanical breath sounds throughout, even, nonlabored, occasionally overbreathing the vent Cardiovascular: Regular rate and rhythm, S1-S2, no murmurs, rubs, gallops Abdomen: Abdominal exam is limited due to intubation sedation: Obese, soft, nontender, nondistended, no guarding or tenderness, bowel sounds plus x 4 Extremities: Normal bulk and tone, no deformities Neuro: Sedated, withdraws from painful stimuli, currently does not follow commands, PERRLA GU: Foley catheter in place draining yellow urine  Resolved Hospital Problem list     Assessment & Plan:   #Shock: Hemorrhagic +/- Septic ~ IMPROVING  #Inferior STEMI #Paroxsymal Atrial Fibrillation PMHx: HTN -Echocardiogram 06/27/24: LVEF 45-50%, indeterminate diastolic parameters, RV systolic function is normal, RV size is normal -Continuous cardiac monitoring -Maintain MAP >65 -Cautious IV fluids -Transfusions as indicated -Vasopressors as needed to maintain MAP goal -Trend lactic acid until normalized -Trend HS Troponin until peaked -Diuresis as BP and renal function permits ~ holding due to shock  -Cardiology following, appreciate input ~ Continue ASA and Prasugrel  -Unable to  anticoagulate for now due to hemorrhagic shock   #Acute Hypoxic Respiratory Failure #Questionable Pneumonia  PMHx: COPD, asthma  -Full vent support, implement lung protective strategies -Plateau pressures less than 30 cm H20 -Wean FiO2 & PEEP as tolerated to maintain O2 sats >92% -Follow intermittent Chest X-ray & ABG as needed -Spontaneous Breathing Trials when respiratory parameters met and mental status permits -Implement VAP Bundle -Prn Bronchodilators -ABX as above   #Acute Blood Loss Anemia #Thrombocytopenia -CT Abdomen & Pelvis 11/10:  no evidence of abdominal wall or retroperitoneal bleed; mild stranding/hemorrhage in the right inguinal region and medial right upper thigh (likely postprocedural) -Monitor for S/Sx of bleeding -Trend CBC -SCD's for VTE Prophylaxis  -Transfuse for Hgb <8 -Transfuse Platelets for Platelet count < 10K; < 50K with active bleeding; < 100K with Neurosurgical procedures/processes  -Check DIC panel ~ Smear negative for schistocytes  #Suspected Pneumonia -Monitor fever curve -Trend WBC's & Procalcitonin -Follow cultures as above -Continue empiric Zosyn pending cultures & sensitivities   #AKI #Anion Gap Metabolic Acidosis due to Lactic Acidosis ~ RESOLVED #Hypokalemia ~ RESOLVED  -Monitor I&O's / urinary output -Follow BMP -Ensure adequate renal perfusion -Avoid nephrotoxic agents as able -Replace electrolytes as indicated ~ Pharmacy following for assistance with electrolyte replacement  #Diabetes Mellitus  -CBG's q4h; Target range of 140 to 180 -SSI -  Follow ICU Hypo/Hyperglycemia protocol  #Acute Metabolic Encephalopathy #Sedation needs in setting of mechanical ventilation  -Maintain a RASS goal of 0 to -1 -Precedex to maintain RASS goal -Avoid sedating medications as able -Daily wake up assessment     Best Practice (right click and Reselect all SmartList Selections daily)   Diet/type: NPO, if fails SBT then will start tube  feeds DVT prophylaxis: SCD GI prophylaxis: PPI Lines: Central line and yes and it is still needed, arterial line still needed Foley:  Yes, and it is still needed Code Status:  full code Last date of multidisciplinary goals of care discussion [11/11]  11/11: Will update pt's daughter when she arrives on plan of care.  Labs   CBC: Recent Labs  Lab 06/26/24 1510 06/26/24 1850 06/27/24 0320 06/27/24 0813 06/27/24 1600 06/27/24 1921 06/27/24 2258 06/28/24 0404  WBC 9.4 12.7* 20.9* 15.1* 21.0*  --   --  17.2*  NEUTROABS 7.6 10.5*  --   --   --   --   --   --   HGB 12.0 8.2* 7.0* 8.3* 9.8* 10.3* 10.6* 10.3*  HCT 36.3 25.1* 22.0* 24.6* 26.7* 28.3* 28.9* 28.1*  MCV 94.5 96.2 99.1 91.8 86.1  --   --  83.6  PLT 133* 171 157 92*  93* 82*  --   --  49*    Basic Metabolic Panel: Recent Labs  Lab 06/26/24 1850 06/26/24 2302 06/27/24 0320 06/27/24 0625 06/27/24 1100 06/27/24 1600 06/28/24 0404  NA 132*   < > 132* 138 138 139 138  K 3.4*   < > 4.2 2.9* 3.2* 4.3 4.3  CL 97*   < > 99 100 102 102 103  CO2 19*   < > 12* 17* 19* 23 23  GLUCOSE 541*   < > 572* 344* 143* 38* 200*  BUN 21   < > 23 22 23  26* 26*  CREATININE 0.99   < > 1.34* 1.25* 1.17* 1.29* 1.42*  CALCIUM  7.5*   < > 7.7* 7.7* 7.7* 7.6* 7.7*  MG 2.2  --   --   --   --   --  1.8  PHOS  --   --   --   --   --   --  2.4*   < > = values in this interval not displayed.   GFR: Estimated Creatinine Clearance: 46.6 mL/min (A) (by C-G formula based on SCr of 1.42 mg/dL (H)). Recent Labs  Lab 06/27/24 0320 06/27/24 0813 06/27/24 1100 06/27/24 1600 06/27/24 1921 06/27/24 2309 06/28/24 0404  WBC 20.9* 15.1*  --  21.0*  --   --  17.2*  LATICACIDVEN >9.0*  --  8.8* 4.1* 4.3* 3.7*  --     Liver Function Tests: Recent Labs  Lab 06/26/24 1510 06/26/24 1850 06/28/24 0404  AST 42* 39  --   ALT 32 25  --   ALKPHOS 222* 152*  --   BILITOT 1.7* 1.5*  --   PROT 8.2* 5.8*  --   ALBUMIN  3.5 2.5* 2.3*   No results for  input(s): LIPASE, AMYLASE in the last 168 hours. No results for input(s): AMMONIA in the last 168 hours.  ABG    Component Value Date/Time   PHART 7.54 (H) 06/28/2024 0404   PCO2ART 28 (L) 06/28/2024 0404   PO2ART 92 06/28/2024 0404   HCO3 23.9 06/28/2024 0404   ACIDBASEDEF 9.6 (H) 06/27/2024 0629   O2SAT 98.1 06/28/2024 0404     Coagulation Profile:  Recent Labs  Lab 06/26/24 1510 06/26/24 1850 06/27/24 0813 06/28/24 0404  INR 1.4* 4.1* 2.0* 1.7*    Cardiac Enzymes: No results for input(s): CKTOTAL, CKMB, CKMBINDEX, TROPONINI in the last 168 hours.  HbA1C: Hgb A1c MFr Bld  Date/Time Value Ref Range Status  06/26/2024 03:10 PM 6.4 (H) 4.8 - 5.6 % Final    Comment:    (NOTE) Diagnosis of Diabetes The following HbA1c ranges recommended by the American Diabetes Association (ADA) may be used as an aid in the diagnosis of diabetes mellitus.  Hemoglobin             Suggested A1C NGSP%              Diagnosis  <5.7                   Non Diabetic  5.7-6.4                Pre-Diabetic  >6.4                   Diabetic  <7.0                   Glycemic control for                       adults with diabetes.    12/25/2023 10:00 PM 10.9 (H) 4.8 - 5.6 % Final    Comment:    (NOTE) Pre diabetes:          5.7%-6.4%  Diabetes:              >6.4%  Glycemic control for   <7.0% adults with diabetes     CBG: Recent Labs  Lab 06/27/24 1235 06/27/24 1703 06/27/24 1910 06/27/24 2307 06/28/24 0321  GLUCAP 71 49* 213* 180* 110*    Review of Systems:   Unable to assess due to intubation/sedation/critical illness    Past Medical History:  She,  has a past medical history of Anemia, Asthma, Back pain, Cataract, Diabetes mellitus without complication (HCC), GERD (gastroesophageal reflux disease), History of echocardiogram, Hypertension, Morbid obesity with BMI of 60.0-69.9, adult (HCC), and Restless leg syndrome.   Surgical History:   Past Surgical  History:  Procedure Laterality Date   ABDOMINAL HYSTERECTOMY     CARDIOVERSION N/A 12/28/2023   Procedure: CARDIOVERSION;  Surgeon: Darliss Rogue, MD;  Location: [facility] ORS;  Service: Cardiovascular;  Laterality: N/A;   CATARACT EXTRACTION W/PHACO Left 04/13/2020   Procedure: CATARACT EXTRACTION PHACO AND INTRAOCULAR LENS PLACEMENT (IOC);  Surgeon: Ferol Rogue, MD;  Location: [facility] OR[facility];  Service: Ophthalmology;  Laterality: Left;  US  00:36.0 CDE 5.95 Fluid Pack lot # [id number]   CORONARY THROMBECTOMY N/A 06/26/2024   Procedure: Coronary Thrombectomy;  Surgeon: Florencio Cara BIRCH, MD;  Location: [facility];  Service: Cardiovascular;  Laterality: N/A;   CORONARY/GRAFT ACUTE MI REVASCULARIZATION N/A 06/26/2024   Procedure: Coronary/Graft Acute MI Revascularization;  Surgeon: Florencio Cara BIRCH, MD;  Location: [facility];  Service: Cardiovascular;  Laterality: N/A;   HYSTEROSCOPY WITH D & C N/A 07/14/2017   Procedure: DILATATION AND CURETTAGE /HYSTEROSCOPY;  Surgeon: Arloa Lamar SQUIBB, MD;  Location: [facility];  Service: Gynecology;  Laterality: N/A;   IR IMAGING GUIDED PORT INSERTION  12/17/2023   LEFT HEART CATH AND CORONARY ANGIOGRAPHY N/A 06/26/2024   Procedure: LEFT HEART CATH AND CORONARY ANGIOGRAPHY;  Surgeon: Florencio Cara BIRCH, MD;  Location: [facility];  Service: Cardiovascular;  Laterality: N/A;   POLYPECTOMY  2015   TEE WITHOUT CARDIOVERSION N/A 12/28/2023   Procedure: ECHOCARDIOGRAM, TRANSESOPHAGEAL;  Surgeon: Darliss Rogue, MD;  Location: [facility];  Service: Cardiovascular;  Laterality: N/A;     Social History:   reports that she has never smoked. She has never used smokeless tobacco. She reports that she does not drink alcohol and does not use drugs.   Family History:  Her family history includes Breast cancer (age of onset: 70) in her mother; Diabetes in her father and mother; Hypertension in her father and mother; Ovarian cancer in her paternal  aunt.   Allergies Allergies  Allergen Reactions   Peanut-Containing Drug Products      Home Medications  Prior to Admission medications   Medication Sig Start Date End Date Taking? Authorizing Provider  acetaminophen  (TYLENOL  8 HOUR ARTHRITIS PAIN) 650 MG CR tablet Take 3 tablets (1,950 mg total) by mouth every 8 (eight) hours as needed for pain. Do not exceed 4000 mg total in a day.  Home med. 04/21/23   Awanda City, MD  amLODipine  (NORVASC ) 10 MG tablet Take 10 mg by mouth daily. 03/28/24   [provider]  apixaban  (ELIQUIS ) 5 MG TABS tablet Take 1 tablet (5 mg total) by mouth 2 (two) times daily. 05/19/24   Babara Call, MD  azelastine  (ASTELIN ) 0.1 % nasal spray Place 1 spray into both nostrils 2 (two) times daily. 10/22/23   [provider]  baclofen  (LIORESAL ) 10 MG tablet Take 10 mg by mouth 3 (three) times daily.    [provider]  dexamethasone  (DECADRON ) 4 MG tablet Take 1 tablet (4 mg total) by mouth daily. Start the day after irinotecan chemotherapy for 2 days. Take with food. 06/16/24   Babara Call, MD  diphenoxylate -atropine  (LOMOTIL ) 2.5-0.025 MG tablet Take 2 tablets by mouth every 6 (six) hours as needed (for diarrhea unrelieved by imodium). 12/21/23   Dasie Tinnie MATSU, NP  EPINEPHrine  0.3 mg/0.3 mL IJ SOAJ injection Inject 0.3 mg into the muscle as needed for anaphylaxis. 09/19/20   Angelena Smalls, MD  fluticasone  (FLONASE ) 50 MCG/ACT nasal spray Place 1 spray into both nostrils daily. 09/29/23   Patel, Sona, MD  fluticasone  furoate-vilanterol (BREO ELLIPTA ) 100-25 MCG/ACT AEPB Inhale 1 puff into the lungs daily. 09/29/23   Patel, Sona, MD  furosemide  (LASIX ) 40 MG tablet Take 40 mg by mouth daily. 03/23/20   [provider]  gabapentin  (NEURONTIN ) 300 MG capsule Take 1 capsule (300 mg total) by mouth 2 (two) times daily. 04/20/24   Borders, Fonda SAUNDERS, NP  ipratropium-albuterol  (DUONEB) 0.5-2.5 (3) MG/3ML SOLN Take 3 mLs by nebulization every 4 (four) hours as  needed (wheezing / shortness of breath). 09/29/23   Patel, Sona, MD  levocetirizine (XYZAL ) 5 MG tablet Take 5 mg by mouth daily. 10/21/23   [provider]  lidocaine -prilocaine  (EMLA ) cream Apply 1 Application topically as needed. Apply to port and cover with saran wrap 1-2 hours prior to port access 01/08/24   Babara Call, MD  loperamide (IMODIUM) 2 MG capsule Take 1 capsule (2 mg total) by mouth See admin instructions. Initial: 4 mg with onset of diarrhea, then 2 mg every 2 hours, or after each unformed stool. Maximum: 16 mg/day 06/16/24   Yu, Zhou, MD  magic mouthwash (lidocaine , diphenhydrAMINE , alum & mag hydroxide) suspension Swish and swallow 5 mLs 4 (four) times daily as needed for mouth pain. 12/18/23   Dasie Tinnie MATSU, NP  metFORMIN  (GLUCOPHAGE )  1000 MG tablet Take 1,000 mg by mouth 2 (two) times daily. 11/27/22   [provider]  metoprolol  tartrate (LOPRESSOR ) 25 MG tablet Take 1 tablet (25 mg total) by mouth 2 (two) times daily. 12/30/23 06/16/24  Trudy Anthony HERO, MD  morphine  (MS CONTIN ) 15 MG 12 hr tablet Take 1 tablet (15 mg total) by mouth every 12 (twelve) hours. 05/04/24   Borders, Fonda SAUNDERS, NP  naloxone  (NARCAN ) nasal spray 4 mg/0.1 mL SPRAY 1 SPRAY INTO ONE NOSTRIL AS DIRECTED FOR OPIOID OVERDOSE (TURN PERSON ON SIDE AFTER DOSE. IF NO RESPONSE IN 2-3 MINUTES OR PERSON RESPONDS BUT RELAPSES, REPEAT USING A NEW SPRAY DEVICE AND SPRAY INTO THE OTHER NOSTRIL. CALL 911 AFTER USE.) * EMERGENCY USE ONLY * Patient not taking: Reported on 06/16/2024 11/20/23   Borders, Fonda SAUNDERS, NP  ondansetron  (ZOFRAN -ODT) 8 MG disintegrating tablet Take 1 tablet (8 mg total) by mouth every 8 (eight) hours as needed for nausea or vomiting. Do not take within 72 hours of chemotherapy. 06/16/24   Babara Call, MD  oxyCODONE  (OXY IR/ROXICODONE ) 5 MG immediate release tablet Take 1 tablet (5 mg total) by mouth every 4 (four) hours as needed for moderate pain (pain score 4-6) or severe pain (pain score  7-10). 06/02/24   Babara Call, MD  potassium chloride  (KLOR-CON  M) 10 MEQ tablet Take 1 tablet (10 mEq total) by mouth daily. 05/19/24   Babara Call, MD  prochlorperazine  (COMPAZINE ) 10 MG tablet Take 1 tablet (10 mg total) by mouth every 6 (six) hours as needed for nausea or vomiting. 06/16/24   Babara Call, MD  rOPINIRole  (REQUIP ) 2 MG tablet Take 2 mg by mouth in the morning and at bedtime.     [provider]  spironolactone  (ALDACTONE ) 50 MG tablet Take 1 tablet (50 mg total) by mouth daily. 02/01/24   Amin, Sumayya, MD  traZODone  (DESYREL ) 50 MG tablet Take 0.5-1 tablets (25-50 mg total) by mouth at bedtime as needed for sleep. 01/08/24   Borders, Fonda SAUNDERS, NP  VENTOLIN  HFA 108 (90 Base) MCG/ACT inhaler Inhale 2 puffs into the lungs every 4 (four) hours as needed for wheezing or shortness of breath. 09/29/23   Patel, Sona, MD     Critical care time: 40 minutes      Inge Lecher, AGACNP-BC [facility] Prefer epic messenger for cross cover needs If after hours, please call E-link

## 2024-06-29 ENCOUNTER — Inpatient Hospital Stay: Admitting: Nurse Practitioner

## 2024-06-29 ENCOUNTER — Inpatient Hospital Stay

## 2024-06-29 DIAGNOSIS — R571 Hypovolemic shock: Secondary | ICD-10-CM | POA: Diagnosis not present

## 2024-06-29 DIAGNOSIS — E118 Type 2 diabetes mellitus with unspecified complications: Secondary | ICD-10-CM

## 2024-06-29 DIAGNOSIS — J9601 Acute respiratory failure with hypoxia: Secondary | ICD-10-CM | POA: Diagnosis not present

## 2024-06-29 DIAGNOSIS — D500 Iron deficiency anemia secondary to blood loss (chronic): Secondary | ICD-10-CM | POA: Diagnosis not present

## 2024-06-29 DIAGNOSIS — I6310 Cerebral infarction due to embolism of unspecified precerebral artery: Secondary | ICD-10-CM

## 2024-06-29 DIAGNOSIS — I213 ST elevation (STEMI) myocardial infarction of unspecified site: Secondary | ICD-10-CM

## 2024-06-29 DIAGNOSIS — R578 Other shock: Secondary | ICD-10-CM | POA: Diagnosis not present

## 2024-06-29 DIAGNOSIS — J690 Pneumonitis due to inhalation of food and vomit: Secondary | ICD-10-CM

## 2024-06-29 DIAGNOSIS — G928 Other toxic encephalopathy: Secondary | ICD-10-CM

## 2024-06-29 DIAGNOSIS — I2111 ST elevation (STEMI) myocardial infarction involving right coronary artery: Secondary | ICD-10-CM | POA: Diagnosis not present

## 2024-06-29 DIAGNOSIS — G929 Unspecified toxic encephalopathy: Secondary | ICD-10-CM | POA: Diagnosis not present

## 2024-06-29 LAB — MAGNESIUM: Magnesium: 2.3 mg/dL (ref 1.7–2.4)

## 2024-06-29 LAB — GLUCOSE, CAPILLARY
Glucose-Capillary: 108 mg/dL — ABNORMAL HIGH (ref 70–99)
Glucose-Capillary: 137 mg/dL — ABNORMAL HIGH (ref 70–99)
Glucose-Capillary: 150 mg/dL — ABNORMAL HIGH (ref 70–99)
Glucose-Capillary: 234 mg/dL — ABNORMAL HIGH (ref 70–99)
Glucose-Capillary: 82 mg/dL (ref 70–99)
Glucose-Capillary: <10 mg/dL — CL (ref 70–99)

## 2024-06-29 LAB — RENAL FUNCTION PANEL
Albumin: 2.4 g/dL — ABNORMAL LOW (ref 3.5–5.0)
Anion gap: 11 (ref 5–15)
BUN: 23 mg/dL (ref 8–23)
CO2: 24 mmol/L (ref 22–32)
Calcium: 7.7 mg/dL — ABNORMAL LOW (ref 8.9–10.3)
Chloride: 107 mmol/L (ref 98–111)
Creatinine, Ser: 1.3 mg/dL — ABNORMAL HIGH (ref 0.44–1.00)
GFR, Estimated: 45 mL/min — ABNORMAL LOW (ref >60)
Glucose, Bld: 209 mg/dL — ABNORMAL HIGH (ref 70–99)
Phosphorus: 2.7 mg/dL (ref 2.5–4.6)
Potassium: 3.8 mmol/L (ref 3.5–5.1)
Sodium: 142 mmol/L (ref 135–145)

## 2024-06-29 LAB — CBC
HCT: 26 % — ABNORMAL LOW (ref 36.0–46.0)
Hemoglobin: 9.2 g/dL — ABNORMAL LOW (ref 12.0–15.0)
MCH: 30.8 pg (ref 26.0–34.0)
MCHC: 35.4 g/dL (ref 30.0–36.0)
MCV: 87 fL (ref 80.0–100.0)
Platelets: 49 K/uL — ABNORMAL LOW (ref 150–400)
RBC: 2.99 MIL/uL — ABNORMAL LOW (ref 3.87–5.11)
RDW: 16.1 % — ABNORMAL HIGH (ref 11.5–15.5)
WBC: 15.8 K/uL — ABNORMAL HIGH (ref 4.0–10.5)
nRBC: 0.6 % — ABNORMAL HIGH (ref 0.0–0.2)

## 2024-06-29 MED ORDER — FENTANYL CITRATE (PF) 50 MCG/ML IJ SOSY
50.0000 ug | PREFILLED_SYRINGE | INTRAMUSCULAR | Status: DC | PRN
  Administered 2024-06-30 – 2024-07-01 (×2): 50 ug via INTRAVENOUS
  Filled 2024-06-29 (×2): qty 1

## 2024-06-29 MED ORDER — POLYETHYLENE GLYCOL 3350 17 G PO PACK
17.0000 g | PACK | Freq: Every day | ORAL | Status: DC
  Administered 2024-06-29 – 2024-07-01 (×2): 17 g
  Filled 2024-06-29 (×2): qty 1

## 2024-06-29 MED ORDER — DOCUSATE SODIUM 50 MG/5ML PO LIQD
100.0000 mg | Freq: Two times a day (BID) | ORAL | Status: DC
  Administered 2024-06-29 – 2024-07-01 (×3): 100 mg
  Filled 2024-06-29 (×3): qty 10

## 2024-06-29 MED ORDER — POTASSIUM & SODIUM PHOSPHATES 280-160-250 MG PO PACK
2.0000 | PACK | Freq: Three times a day (TID) | ORAL | Status: AC
  Administered 2024-06-29: 2
  Filled 2024-06-29: qty 2

## 2024-06-29 MED ORDER — FENTANYL CITRATE (PF) 50 MCG/ML IJ SOSY: 50.0000 ug | PREFILLED_SYRINGE | INTRAMUSCULAR | Status: DC | PRN

## 2024-06-29 NOTE — Progress Notes (Signed)
 Rounding Note   Patient Name: Kristina Cruz Date of Encounter: 06/29/2024  [facility] Cardiologist: Evalene Lunger, MD   Subjective On rounds remains intubated Failed spontaneous breathing trial CVP estimated 4, 7 Pulse rate 100 up to 117 Blood pressure running high 150 up to 170s systolic Hemoglobin 9.2, Echo detailing ejection fraction 45 to 50%  On Precedex, feeding supplement, nor epi  Scheduled Meds:  aspirin   81 mg Per Tube Daily   Chlorhexidine  Gluconate Cloth  6 each Topical Daily   free water  30 mL Per Tube Q4H   insulin  aspart  3-9 Units Subcutaneous Q4H   mouth rinse  15 mL Mouth Rinse Q2H   pantoprazole  (PROTONIX ) IV  40 mg Intravenous QHS   prasugrel  10 mg Per Tube Daily   sodium chloride  flush  3 mL Intravenous Q12H   Continuous Infusions:  dexmedetomidine (PRECEDEX) IV infusion Stopped (06/29/24 0923)   feeding supplement (VITAL HIGH PROTEIN) Stopped (06/29/24 1351)   norepinephrine (LEVOPHED) Adult infusion Stopped (06/29/24 0944)   piperacillin-tazobactam (ZOSYN)  IV Stopped (06/29/24 0913)   PRN Meds: acetaminophen , dextrose , iohexol , ondansetron  (ZOFRAN ) IV, mouth rinse, sodium chloride  flush, sodium chloride  flush   Vital Signs  Vitals:   06/29/24 1245 06/29/24 1300 06/29/24 1315 06/29/24 1330  BP:      Pulse: (!) 110 (!) 117 (!) 115 (!) 115  Resp: (!) 39 (!) 32 (!) 37 (!) 29  Temp: 99.5 F (37.5 C) 99.5 F (37.5 C) 99.5 F (37.5 C) 99.5 F (37.5 C)  TempSrc:      SpO2: 96% 97% 96% 96%  Weight:      Height:        Intake/Output Summary (Last 24 hours) at 06/29/2024 1357 Last data filed at 06/29/2024 1352 Gross per 24 hour  Intake 1886.73 ml  Output 2613 ml  Net -726.27 ml      06/29/2024    3:30 AM 06/28/2024    3:15 AM 06/26/2024    8:10 PM  Last 3 Weights  Weight (lbs) 248 lb 0.3 oz 253 lb 8.5 oz 244 lb 4.3 oz  Weight (kg) 112.5 kg 115 kg 110.8 kg      Telemetry Normal sinus rhythm- Personally  Reviewed  ECG  - Personally Reviewed  Physical Exam  GEN: Intubated, sedated Neck: No JVD Cardiac: RRR, no murmurs, rubs, or gallops.  Respiratory: Clear to auscultation bilaterally. GI: Soft, nontender, non-distended  MS: No edema; No deformity. Neuro:  Nonfocal  Psych: Normal affect   Labs High Sensitivity Troponin:   Recent Labs  Lab 06/26/24 1510 06/26/24 1850 06/26/24 2302  TROPONINIHS 74* 9,572* >24,000*     Chemistry Recent Labs  Lab 06/26/24 1510 06/26/24 1850 06/26/24 2302 06/27/24 1600 06/28/24 0404 06/29/24 0410  NA 132* 132*   < > 139 138 142  K 3.4* 3.4*   < > 4.3 4.3 3.8  CL 95* 97*   < > 102 103 107  CO2 23 19*   < > 23 23 24   GLUCOSE 463* 541*   < > 38* 200* 209*  BUN 19 21   < > 26* 26* 23  CREATININE 0.93 0.99   < > 1.29* 1.42* 1.30*  CALCIUM  8.5* 7.5*   < > 7.6* 7.7* 7.7*  MG  --  2.2  --   --  1.8 2.3  PROT 8.2* 5.8*  --   --   --   --   ALBUMIN  3.5 2.5*  --   --  2.3* 2.4*  AST 42* 39  --   --   --   --   ALT 32 25  --   --   --   --   ALKPHOS 222* 152*  --   --   --   --   BILITOT 1.7* 1.5*  --   --   --   --   GFRNONAA >60 >60   < > 46* 41* 45*  ANIONGAP 14 16*   < > 14 12 11    < > = values in this interval not displayed.    Lipids  Recent Labs  Lab 06/26/24 1510  CHOL 175  TRIG 145  HDL 48  LDLCALC 98  CHOLHDL 3.6    Hematology Recent Labs  Lab 06/27/24 1600 06/27/24 1921 06/28/24 0404 06/28/24 1035 06/29/24 0410  WBC 21.0*  --  17.2*  --  15.8*  RBC 3.10*  --  3.36*  --  2.99*  HGB 9.8*   < > 10.3* 10.0* 9.2*  HCT 26.7*   < > 28.1* 28.2* 26.0*  MCV 86.1  --  83.6  --  87.0  MCH 31.6  --  30.7  --  30.8  MCHC 36.7*  --  36.7*  --  35.4  RDW 15.5  --  15.9*  --  16.1*  PLT 82*  --  49*  --  49*   < > = values in this interval not displayed.   Thyroid  No results for input(s): TSH, FREET4 in the last 168 hours.  BNPNo results for input(s): BNP, PROBNP in the last 168 hours.  DDimer  Recent Labs  Lab  06/27/24 0813  DDIMER 11.15*     Radiology  DG Chest Port 1 View Result Date: 06/28/2024 EXAM: 1 VIEW(S) XRAY OF THE CHEST 06/27/2024 05:34 AM COMPARISON: Portable chest dated 06/27/2024 05:34 AM. CLINICAL HISTORY: Acute respiratory failure with hypoxia (HCC) 427266. FINDINGS: LINES, TUBES AND DEVICES: ETT tip is 4.7 cm from the carina. Left IJ central line tip in the distal SVC. Right IJ port catheter terminates in the upper right atrium. NG tube is well inside the stomach but the tip is out of view. LUNGS AND PLEURA: The lungs are expiratory. There is increased opacity in the right greater than left lung bases which could be atelectasis or consolidation. Small pleural effusions may also be present. The mid and upper lung fields are clear. No pneumothorax. HEART AND MEDIASTINUM: Cardiomegaly without evidence of congestive heart failure. Stable mediastinum. BONES AND SOFT TISSUES: Advanced thoracic spondylosis. No new osseous finding. IMPRESSION: 1. Basilar opacities, right greater than left, compatible with atelectasis versus consolidation in the setting of low lung volumes . Small pleural effusions possible. 2. Cardiomegaly without overt congestive failure. Electronically signed by: Francis Quam MD 06/28/2024 05:56 AM EST RP Workstation: [id number]   CT ABDOMEN PELVIS WO CONTRAST Result Date: 06/27/2024 EXAM: CT ABDOMEN AND PELVIS WITHOUT CONTRAST 06/27/2024 09:07:01 PM TECHNIQUE: CT of the abdomen and pelvis was performed without the administration of intravenous contrast. Multiplanar reformatted images are provided for review. Automated exposure control, iterative reconstruction, and/or weight-based adjustment of the mA/kV was utilized to reduce the radiation dose to as low as reasonably achievable. COMPARISON: 06/07/2024 CLINICAL HISTORY: Retroperitoneal bleed suspected. Anemia following cardiac catheterization. History of metastatic pancreatic cancer. FINDINGS: LIMITATIONS/ARTIFACTS: Motion  degraded images. LOWER CHEST: Complete right lower lobe atelectasis/collapse, without obstructing endobronchial lesion. LIVER: Innumerable hepatic metastases, mildly progressive. Index lesion in segment 4b (image 31) measures 4.0 cm,  previously 3.2 cm. GALLBLADDER AND BILE DUCTS: Status post cholecystectomy. No biliary ductal dilatation. SPLEEN: No acute abnormality. PANCREAS: Pancreatic tail mass measures 3.7 x 8.4 cm, previously 4.3 x 7.2 cm, similar. This corresponds to the patient's known pancreatic cancer. ADRENAL GLANDS: No acute abnormality. KIDNEYS, URETERS AND BLADDER: No stones in the kidneys or ureters. No hydronephrosis. No perinephric or periureteral stranding. Bladder decompressed by an indwelling foley catheter. GI AND BOWEL: Enteric tube is looped in the gastric antrum. There is no bowel obstruction. PERITONEUM AND RETROPERITONEUM: No ascites. No free air. No evidence of abdominal wall and retroperitoneal hematoma. VASCULATURE: Aorta is normal in caliber. LYMPH NODES: No lymphadenopathy. REPRODUCTIVE ORGANS: Status post hysterectomy. BONES AND SOFT TISSUES: Degenerative changes of the visualized thoracolumbar spine. Moderate degenerative changes of the bilateral hips. Mild stranding / hemorrhage in the right inguinal region (image 86) extending to the medial right upper thigh (image 96), likely postprocedural. IMPRESSION: 1. No evidence of abdominal wall or retroperitoneal bleed. 2. Mild stranding/hemorrhage in the right inguinal region and medial right upper thigh, likely postprocedural. 3. Grossly stable pancreatic tail mass, corresponding to the patient's known pancreatic cancer. 4. Innumerable hepatic metastases, progressive. 5. Complete right lower lobe atelectasis/collapse, without obstructing endobronchial lesion, likely reflecting mucous plugging. Electronically signed by: Pinkie Pebbles MD 06/27/2024 09:20 PM EST RP Workstation: [id number]   ECHOCARDIOGRAM COMPLETE Result Date:  06/27/2024    ECHOCARDIOGRAM REPORT   Patient Name:   Kristina Cruz Date of Exam: 06/27/2024 Medical Rec #:  [id number]          Height: Accession #:    [id number]         Weight: Date of Birth:  1961/03/26          BSA: Patient Age:    63 years           BP:           91/54 mmHg Patient Gender: F                  HR:           101 bpm. Exam Location:  [facility] Procedure: 2D Echo, Cardiac Doppler, Color Doppler and Intracardiac            Opacification Agent (Both Spectral and Color Flow Doppler were            utilized during procedure). Indications:     Acute myocardial infarction  History:         Patient has prior history of Echocardiogram examinations, most                  recent 12/28/2023. COPD; Risk Factors:Hypertension, Diabetes and                  Dyslipidemia.  Sonographer:     Philomena Daring Referring Phys:  [id number] DWAYNE D CALLWOOD Diagnosing Phys: Deatrice Cage MD  Sonographer Comments: Patient is intubated. IMPRESSIONS  1. Left ventricular ejection fraction, by estimation, is 45 to 50%. The left ventricle has mildly decreased function. Left ventricular endocardial border not optimally defined to evaluate regional wall motion. There is moderate left ventricular hypertrophy. Left ventricular diastolic parameters are indeterminate.  2. Right ventricular systolic function is normal. The right ventricular size is normal. Tricuspid regurgitation signal is inadequate for assessing PA pressure.  3. The mitral valve is normal in structure. No evidence of mitral valve regurgitation. No evidence of mitral stenosis.  4. The aortic valve is normal in structure. Aortic valve  regurgitation is not visualized. No aortic stenosis is present.  5. The inferior vena cava is dilated in size with >50% respiratory variability, suggesting right atrial pressure of 8 mmHg.  6. Challenging image quality. FINDINGS  Left Ventricle: Left ventricular ejection fraction, by estimation, is 45 to 50%. The left ventricle has mildly  decreased function. Left ventricular endocardial border not optimally defined to evaluate regional wall motion. Definity  contrast agent was given IV to delineate the left ventricular endocardial borders. The left ventricular internal cavity size was normal in size. There is moderate left ventricular hypertrophy. Left ventricular diastolic parameters are indeterminate. Right Ventricle: The right ventricular size is normal. No increase in right ventricular wall thickness. Right ventricular systolic function is normal. Tricuspid regurgitation signal is inadequate for assessing PA pressure. Left Atrium: Left atrial size was normal in size. Right Atrium: Right atrial size was normal in size. Pericardium: Trivial pericardial effusion is present. Mitral Valve: The mitral valve is normal in structure. No evidence of mitral valve regurgitation. No evidence of mitral valve stenosis. Tricuspid Valve: The tricuspid valve is normal in structure. Tricuspid valve regurgitation is not demonstrated. No evidence of tricuspid stenosis. Aortic Valve: The aortic valve is normal in structure. Aortic valve regurgitation is not visualized. No aortic stenosis is present. Pulmonic Valve: The pulmonic valve was normal in structure. Pulmonic valve regurgitation is not visualized. No evidence of pulmonic stenosis. Aorta: The aortic root is normal in size and structure. Venous: The inferior vena cava is dilated in size with greater than 50% respiratory variability, suggesting right atrial pressure of 8 mmHg. IAS/Shunts: No atrial level shunt detected by color flow Doppler.  LEFT VENTRICLE PLAX 2D LVIDd:         4.60 cm   Diastology LVIDs:         3.20 cm   LV e' medial:  4.46 cm/s LV PW:         1.30 cm   LV e' lateral: 7.94 cm/s LV IVS:        1.50 cm LVOT diam:     2.20 cm LV SV:         48 LVOT Area:     3.80 cm  RIGHT VENTRICLE            IVC RV S prime:     6.64 cm/s  IVC diam: 2.00 cm TAPSE (M-mode): 1.6 cm LEFT ATRIUM             RIGHT  ATRIUM LA diam:        3.00 cm RA Area:     14.50 cm LA Vol (A2C):   22.0 ml RA Volume:   28.40 ml LA Vol (A4C):   32.3 ml LA Biplane Vol: 26.5 ml  AORTIC VALVE LVOT Vmax:   105.00 cm/s LVOT Vmean:  63.900 cm/s LVOT VTI:    0.125 m  AORTA Ao Root diam: 3.10 cm Ao Asc diam:  3.40 cm  SHUNTS Systemic VTI:  0.12 m Systemic Diam: 2.20 cm Deatrice Cage MD Electronically signed by Deatrice Cage MD Signature Date/Time: 06/27/2024/3:23:10 PM    Final     Cardiac Studies   Patient Profile   63 y.o. female with past medical history of paroxysmal atrial fibrillation on apixaban  status post cardioversion, stage IV pancreatic cancer with mets to the liver on active chemo, type 2 diabetes, morbid obesity, COPD, asthma, hypertension, GERD, RLS, chronic anemia, who has been seen and evaluated for recent inferior STEMI.   Assessment & Plan  Inferior STEMI Presenting from home with chest pain, EKG changes with ST elevation and reciprocal changes -Troponin greater than 24,000 - Started on heparin  infusion, aspirin , taken to the cardiac catheterization lab - 100% stenosis mid to distal RCA, DES x 2 - By report significant blood loss during procedure - Right groin hematoma rule out on CT - Echo with mildly depressed ejection fraction 45% - Remains intubated sedated, failed spontaneous breathing trial  Acute anemia Hemoglobin 12 down to 7.0 after blood loss, transfused 3 units packed red blood cells - No signs of bleeding  Hypotension Weaning Levophed, intubated on sedation  Pancreatic cancer stage IV undergoing chemotherapy In the past has been a weekly chemotherapy Management per oncology  Diabetes type 2 Poorly controlled A1c 10.9 On insulin   Paroxysmal atrial fibrillation Currently maintaining normal sinus rhythm Eliquis  on hold until hemoglobin stabilizes Prior cardioversion May 2025 - Could consider Plavix Eliquis  at the time of discharge  Hypercapnic hypoxic respiratory failure On  Precedex, pressors, intubated Managed by ICU team  For questions or updates, please contact [facility] Please consult [email or web address] for contact info under     Signed, Elizer Bostic, MD  06/29/2024, 1:57 PM

## 2024-06-29 NOTE — Consult Note (Signed)
 PHARMACY CONSULT NOTE  Pharmacy Consult for Electrolyte Monitoring and Replacement   Recent Labs: Potassium (mmol/L)  Date Value  06/29/2024 3.8  04/10/2014 3.4 (L)   Magnesium  (mg/dL)  Date Value  88/87/7974 2.3   Calcium  (mg/dL)  Date Value  88/87/7974 7.7 (L)   Calcium , Total (mg/dL)  Date Value  91/75/7984 8.8   Albumin  (g/dL)  Date Value  88/87/7974 2.4 (L)  04/10/2014 3.1 (L)   Phosphorus (mg/dL)  Date Value  88/87/7974 2.7   Sodium (mmol/L)  Date Value  06/29/2024 142  04/10/2014 143   Assessment: Kristina Cruz is a 39 yoF that presented with chest pain. Past medical history is notable for diabetes, hypertension, and morbid obesity.  Goal of Therapy: electrolytes wnl  Plan:  Phos 2.7, repeat Phos-Nak 2 packets per tube x 1 dose today Re-check labs tomorrow AM  Marolyn KATHEE Mare 06/29/2024 8:48 AM

## 2024-06-29 NOTE — TOC Initial Note (Signed)
 Transition of Care [facility]) - Initial/Assessment Note    Patient Details  Name: Kristina Cruz MRN: [id number] Date of Birth: 21-Jul-1961  [facility]) CM/SW Contact:    Corrie JINNY Ruts, LCSW Phone Number: 06/29/2024, 3:14 PM  Clinical Narrative:                 Chart reviewed. The patient was admitted for STEMI. The patient is currently intubated. I received a consult for medication assistance. I was able to speak with the patient daughter via telephone. Per chart review the patient PCP is Catheryn Devonshire. The patient daughter reports that the patient lives by herself. The patient daughter reports that the patient needed assistance and her [facility] agency would assist her.   The patient daughter reports that she or the patient [facility] agency would drive the patient to medical appointments. The patient daughter reports that she will assist the patient at D/C. The patient daughter reports that the patient uses [facility] pharmacy. The patient daughter reports that the patient is current active with [facility] but does not remember the agency name. The patient daughter reports that the patient seen the agency the day before being admitted into the hospital. The patient daughter also report that the patient was a resident at [facility] a few months ago.   The patient daughter reports that the patient has a walker, BSC, and shower seat in the home. The patient daughter did not have any concerns or questions during the time of the assessment.   SW will follow up with the patient we medically stable about medication assistance.   Per bamboo the patient was active with [facility] in the past but not currently active. [place]  confirmed that the patient is not active with [facility].   TOC will follow the patient until D/C.     Barriers to Discharge: Continued Medical Work up   Patient Goals and CMS Choice            Expected Discharge Plan and Services       Living arrangements for the past 2  months: Apartment                                      Prior Living Arrangements/Services Living arrangements for the past 2 months: Apartment Lives with:: Self Patient language and need for interpreter reviewed:: Yes        Need for Family Participation in Patient Care: Yes (Comment)     Criminal Activity/Legal Involvement Pertinent to Current Situation/Hospitalization: No - Comment as needed  Activities of Daily Living      Permission Sought/Granted            Permission granted to share info w Relationship: daughter  Permission granted to share info w Contact Information: Harlene Lesches: [phone]  Emotional Assessment Appearance:: Other (Comment Required (the patient is intubated) Attitude/Demeanor/Rapport: Intubated (Following Commands or Not Following Commands) Affect (typically observed): Unable to Assess   Alcohol / Substance Use: Not Applicable Psych Involvement: No (comment)  Admission diagnosis:  ST elevation myocardial infarction (STEMI), unspecified artery (HCC) [I21.3] STEMI involving right coronary artery (HCC) [I21.11] Acute ST elevation myocardial infarction (STEMI) of inferior wall (HCC) [I21.19] Patient Active Problem List   Diagnosis Date Noted   Blood loss anemia 06/29/2024   Toxic metabolic encephalopathy 06/28/2024   Hemorrhagic shock (HCC) 06/27/2024   ST elevation myocardial infarction (STEMI) (HCC) 06/26/2024   Acute ST  elevation myocardial infarction (STEMI) of inferior wall (HCC) 06/26/2024   Pulmonary embolism (HCC) 05/19/2024   Need for prophylactic vaccination and inoculation against influenza 05/19/2024   Chest pain 05/12/2024   Troponin level elevated 01/30/2024   CHF (congestive heart failure) (HCC) 01/30/2024   Leg edema 01/08/2024   Typical atrial flutter (HCC) 12/26/2023   Atrial fibrillation with RVR (HCC) 12/26/2023   Morbid obesity (HCC) 12/26/2023   Pressure injury of skin 12/26/2023   AKI (acute kidney injury)  12/25/2023   Sepsis (HCC) 12/25/2023   SIRS (systemic inflammatory response syndrome) (HCC) 12/25/2023   Severe sepsis (HCC) 12/25/2023   Gastroenteritis 12/25/2023   Cavitary lesion of lung 12/25/2023   Acute heart failure (HCC) 12/25/2023   Persistent atrial fibrillation (HCC) 12/25/2023   Hypokalemia 12/11/2023   Chemotherapy-induced neuropathy 12/11/2023   Encounter for antineoplastic chemotherapy 11/27/2023   Essential hypertension 11/06/2023   Dyslipidemia 11/06/2023   Type 2 diabetes mellitus with peripheral neuropathy (HCC) 11/06/2023   Chronic obstructive pulmonary disease (COPD) (HCC) 11/06/2023   Neoplasm related pain 11/06/2023   Palliative care encounter 11/06/2023   Epigastric pain 11/06/2023   Liver mass 11/06/2023   Malignant neoplasm of pancreas (HCC) 11/05/2023   Acute asthma exacerbation 09/28/2023   Acute hypoxic respiratory failure (HCC) 08/21/2023   Pneumonia due to COVID-19 virus 08/21/2023   Pancreatic mass 08/21/2023   Spinal stenosis of lumbar region 08/21/2023   Degeneration of lumbar intervertebral disc 08/21/2023   Mastoid effusion, left 06/30/2023   Sinusitis 06/28/2023   Diabetes mellitus type 2 with complications (HCC) 06/28/2023   Asthma exacerbation 06/28/2023   COPD with acute bronchitis (HCC) 04/18/2023   COPD with acute exacerbation (HCC) 04/18/2023   Respiratory failure with hypoxia (HCC) 02/10/2023   Uncontrolled type 2 diabetes mellitus with hyperglycemia, without long-term current use of insulin  (HCC) 02/08/2018   Abnormal ECG 12/31/2017   Chest pain with low risk for cardiac etiology 12/31/2017   Preop cardiovascular exam 12/31/2017   SOB (shortness of breath) on exertion 12/31/2017   Restless leg syndrome 12/16/2017   Hypertension 12/16/2017   Endometrial hyperplasia without atypia, simple 12/03/2017   Endometrial polyp 11/11/2017   Post-menopausal bleeding 11/11/2017   Endometrial thickening on ultrasound 04/27/2017   Obesity,  Class III, BMI 40-49.9 (morbid obesity) (HCC) 04/27/2017   PCP:  Debarah [name], FNP Pharmacy:   [id number] [place], [place] - [address] AT [facility] [address] [place] [address] Phone: [phone] Fax: [phone]     Social Drivers of Health (SDOH) Social History: SDOH Screenings   Food Insecurity: Patient Unable To Answer (06/29/2024)  Housing: Unknown (06/29/2024)  Transportation Needs: Patient Unable To Answer (06/29/2024)  Utilities: Patient Unable To Answer (06/29/2024)  Depression (PHQ2-9): Low Risk  (06/22/2024)  Financial Resource Strain: Low Risk  (05/11/2024)   Received from [facility]  Social Connections: Patient Unable To Answer (06/29/2024)  Tobacco Use: Low Risk  (06/16/2024)  Recent Concern: Tobacco Use - Medium Risk (05/11/2024)   Received from [facility]   SDOH Interventions:     Readmission Risk Interventions    06/29/2024    3:01 PM 08/23/2023    4:39 PM  Readmission Risk Prevention Plan  Transportation Screening Complete Complete  PCP or Specialist Appt within 5-7 Days  Complete  Home Care Screening  Complete  Medication Review (RN CM)  Complete  Medication Review (RN Care Manager) Complete   PCP  or Specialist appointment within 3-5 days of discharge Complete   HRI or Home Care Consult Complete   SW Recovery Care/Counseling Consult Complete   Palliative Care Screening Not Applicable   Skilled Nursing Facility Not Applicable

## 2024-06-29 NOTE — Progress Notes (Signed)
 RT assisted with patient transport to MRI and back to ICU with no complications. Pt transported on Sero-air ventilator. Pt returned to previous PRVC settings before transport due to increased WOB, HR, and RR.

## 2024-06-29 NOTE — Plan of Care (Signed)
  Problem: Clinical Measurements: Goal: Diagnostic test results will improve Outcome: Not Progressing Goal: Respiratory complications will improve Outcome: Not Progressing Goal: Cardiovascular complication will be avoided Outcome: Not Progressing   Problem: Nutrition: Goal: Adequate nutrition will be maintained Outcome: Not Progressing   Problem: Activity: Goal: Ability to return to baseline activity level will improve Outcome: Not Progressing   Problem: Cardiovascular: Goal: Ability to achieve and maintain adequate cardiovascular perfusion will improve Outcome: Not Progressing

## 2024-06-29 NOTE — Progress Notes (Addendum)
 NAME:  Kristina Cruz, MRN:  [id number], DOB:  30-May-1961, LOS: 3 ADMISSION DATE:  06/26/2024, CONSULTATION DATE:  06/27/2024 REFERRING MD:  Dr. Manfred, CHIEF COMPLAINT:  Hypotension   Brief Pt Description / Synopsis:  63 y.o. female with PMHx significant for Stage IV Pancreatic cancer undergoing chemotherapy who is admitted with inferior STEMI requiring PCI and DES to the distal RCA.  Course complicated by hemorrhagic shock due to bleeding from right femoral/radial cath puncture sites, metabolic acidosis, acute hypoxic respiratory failure, and acute metabolic encephalopathy requiring intubation and mechanical ventilation.  History of Present Illness:  Kristina Cruz is a 63 y.o. female with a  past medical history of paroxysmal atrial fibrillation on apixaban  status post cardioversion, stage IV pancreatic cancer with mets to the liver on active chemo, type 2 diabetes, morbid obesity, COPD, asthma, hypertension, GERD, RLS, and chronic anemia who presented to [facility] ED on 06/26/24 with acute chest discomfort.  She reported the chest pain started about an hour prior to presentation (rated as 11/10 and mid sternal) and felt like an with associated shortness of breath and diaphoresis.  In the ED she was found to have an inferior STEMI with reciprocal changes.  She was given Heparin , nitroglycerin, and aspirin .  Cardiology was consulted and she was take for emergent cardiac.  She was found to have occlusion of the distal RCA requiring PCI and DES to the distal RCA.  Post cath she was noted to have bleeding for her right radial wrist site and with a right groin hematoma.  TRH asked to admit for further workup and treatment, however after arrival to ICU following cath, she was noted to be hypotensive.  PCCM was consulted.   Please see Significant Hospital Events section below for full detailed hospital course.   Pertinent  Medical History   Past Medical History:  Diagnosis Date   Anemia     Asthma    Back pain    Cataract    Diabetes mellitus without complication (HCC)    GERD (gastroesophageal reflux disease)    History of echocardiogram    Hypertension    Morbid obesity with BMI of 60.0-69.9, adult (HCC)    Restless leg syndrome     Micro Data:  11/9: MRSA PCR>> negative 11/9: Blood cultures x2>> no growth to date 11/10: Tracheal aspirate>> gram + cocci  Antimicrobials:   Anti-infectives (From admission, onward)    Start     Dose/Rate Route Frequency Ordered Stop   06/27/24 1645  piperacillin-tazobactam (ZOSYN) IVPB 3.375 g        3.375 g 12.5 mL/hr over 240 Minutes Intravenous Every 8 hours 06/27/24 1554          Significant Hospital Events: Including procedures, antibiotic start and stop dates in addition to other pertinent events   11/9: Presented to ED with chest pain, found to have inferior ST elevation on EKG, Cardiac consulted and emergency cath performed with PCI and stent to distal RCA.  Post cath developed right groin hematoma and hypotension concerning for possible hemorrhagic shock.  Was placed on vasopressors, PCCM consulted. 10/10: Earlier this morning became obtunded requiring intubation.  Remains critically ill, requiring Levophed.  Massive transfusion protocol initiated (received 4 pRBC's, 2 FFP, 2 Cryo).  CT Abdomen & Pelvis without evidence of retroperitoneal bleed, did show mild stranding/hemorrhage in right inguinal region and medial right upper thigh. 11/11: No significant events overnight.  Hgb stable overnight, no additional blood products required.  Weaning Levophed (down to 16 mcg currently).  Fever overnight (T Max 101.6), Tracheal aspirate with gram + cocci, will continue Zosyn for now.  On minimal vent support, plan for WUA/SBT later today as tolerated.  11/12: No significant overnight events. Slight decline in Hgb at 9.2. No longer requiring levophed. Tmax overnight 100.8, remains on Zosyn. Weaned off precedex. On minimal vent support,  placed in SBT and tolerated fairly well.   Interim History / Subjective:  As outlined above under Significant Hospital Events section  Objective   Blood pressure 127/63, pulse 73, temperature (!) 100.8 F (38.2 C), resp. rate (!) 22, height 5' 1 (1.549 m), weight 112.5 kg, last menstrual period 01/04/2018, SpO2 94%. CVP:  [4 mmHg-5 mmHg] 5 mmHg  Vent Mode: PRVC FiO2 (%):  [35 %-40 %] 35 % Set Rate:  [20 bmp] 20 bmp Vt Set:  [450 mL] 450 mL PEEP:  [5 cmH20] 5 cmH20 Plateau Pressure:  [20 cmH20] 20 cmH20   Intake/Output Summary (Last 24 hours) at 06/29/2024 9094 Last data filed at 06/29/2024 0751 Gross per 24 hour  Intake 1260.56 ml  Output 3255 ml  Net -1994.44 ml   Filed Weights   06/26/24 2010 06/28/24 0315 06/29/24 0330  Weight: 110.8 kg 115 kg 112.5 kg    Examination: General: Critically ill-appearing female, laying in bed, intubated and sedated, no acute distress HENT: Atraumatic, normocephalic, neck supple, difficult to assess JVD due to body habitus, orally intubated Lungs: Mechanical breath sounds throughout, even, nonlabored, occasionally overbreathing the vent Cardiovascular: Regular rate and rhythm, S1-S2, no murmurs, rubs, gallops Abdomen: Obese, soft, nontender, nondistended, no rebound/guarding, bowel sounds  x 4 Extremities: cool, dry, intact, no edema, radial pulses 2+, distal pulses 1+ Neuro: no longer sedated, withdraws from painful stimuli only on the right side, follows commands on right side, PERRL GU: Foley catheter in place draining yellow urine  Resolved Hospital Problem list     Assessment & Plan:   #Acute Metabolic Encephalopathy #Sedation needs in setting of mechanical ventilation ~ Resolved - PAD protocol - No longer requiring precedex - Avoid sedating medications as able - Daily wake up assessment; able to follow some commands  - Will need to get Brain MRI to r/o CVA d/t lack of response to painful stimuli on left side  #Shock:  Hemorrhagic +/- Septic ~ Improving  #Inferior STEMI s/p PCI #Paroxsymal Atrial Fibrillation #HFmrEF PMHx: HTN, PAF ECHO 06/27/24: LVEF 45-50%, indeterminate diastolic parameters, RV systolic function is normal, RV size is normal - Continuous cardiac monitoring - Vasopressors to maintain MAP >65 ~ no longer requiring levophed - IV fluids with caution - Lactic peaked and has been trending down - Troponin peaked at >24,000 - Diuresis as BP and renal function permits ~ holding due to shock  - Cardiology following; appreciate input - Per cardiology, continue aspirin  and prasugrel - Continue holding anticoagulation given hemorrhagic shock - Intermittent EKG as indicated  #Acute Hypoxic Respiratory Failure secondary to Hemorrhagic Shock #Lobar Pneumonia  PMHx: COPD, asthma  - Full ventilator support - Wean FiO2 and PEEP as tolerated to maintain O2 sats >92% - Lung protective strategies - VAP bundle - Maintain plateau pressures less than 30 cm H2O - SBT once respiratory and mental parameters met ~ placed on PS, tolerating well - Follow intermittent Chest X-ray & ABG as needed - Bronchodilators prn - ABX: Continue Zosyn  #Lobar Pneumonia - Trend WBC and monitor fever curve - WBC trending down at 15.8 this am - Tmax 100.8 overnight - Blood cultures pending - Continue ABX:  Zosyn - Narrow ABX pending culture and sensitivity results - Strep pneumo and Legionella urinary antigen pending  #AKI secondary to Hemorrhagic Shock #Anion Gap Metabolic Acidosis due to Lactic Acidosis ~ RESOLVED #Hypokalemia ~ RESOLVED  - Strict I/O's - Trend BMP and monitor renal function - Creatinine improving - Avoid nephrotoxins as able - Ensure adequate renal perfusion - ICU electrolyte replacement protocol - Pharmacy to assist with replacement  #Stage IV Pancreatic Cancer #GI Prophylaxis - GI PPX: Continue Protonix  40mg  - Most recent imaging is stable from previous  #Acute Blood Loss  Anemia #Thrombocytopenia 11/10 CT Abdomen & Pelvis:  no evidence of abdominal wall or retroperitoneal bleed; mild stranding/hemorrhage in the right inguinal region and medial right upper thigh  (likely postprocedural) - Trend CBC, monitor H/H and platelets - Transfuse if Hgb < 8; 9.2 this am and platelets 49 - Massive transfusion protocol administered 11/10; has not required since - DVT prophylaxis: SCD - Monitor for s/sx of bleeding - Transfuse Platelets if count < 10K; < 50K with active bleeding - DIC panel wnl ~ Smear negative for schistocytes  #Type II Diabetes Mellitus  - ICU hypo/hyperglycemia protocol - BG Range: 140-180s - Continue Novolog  - CBG Q4h   Best Practice (right click and Reselect all SmartList Selections daily)   Diet/type: NPO, if fails SBT then will start tube feeds DVT prophylaxis: SCD GI prophylaxis: PPI Lines: Central line and yes and it is still needed, arterial line still needed Foley:  Yes, and it is still needed Code Status:  full code Last date of multidisciplinary goals of care discussion [11/11]   Labs   CBC: Recent Labs  Lab 06/26/24 1510 06/26/24 1850 06/27/24 0320 06/27/24 0813 06/27/24 1600 06/27/24 1921 06/27/24 2258 06/28/24 0404 06/28/24 1035 06/29/24 0410  WBC 9.4 12.7* 20.9* 15.1* 21.0*  --   --  17.2*  --  15.8*  NEUTROABS 7.6 10.5*  --   --   --   --   --   --   --   --   HGB 12.0 8.2* 7.0* 8.3* 9.8* 10.3* 10.6* 10.3* 10.0* 9.2*  HCT 36.3 25.1* 22.0* 24.6* 26.7* 28.3* 28.9* 28.1* 28.2* 26.0*  MCV 94.5 96.2 99.1 91.8 86.1  --   --  83.6  --  87.0  PLT 133* 171 157 92*  93* 82*  --   --  49*  --  49*    Basic Metabolic Panel: Recent Labs  Lab 06/26/24 1850 06/26/24 2302 06/27/24 0625 06/27/24 1100 06/27/24 1600 06/28/24 0404 06/29/24 0410  NA 132*   < > 138 138 139 138 142  K 3.4*   < > 2.9* 3.2* 4.3 4.3 3.8  CL 97*   < > 100 102 102 103 107  CO2 19*   < > 17* 19* 23 23 24   GLUCOSE 541*   < > 344* 143* 38*  200* 209*  BUN 21   < > 22 23 26* 26* 23  CREATININE 0.99   < > 1.25* 1.17* 1.29* 1.42* 1.30*  CALCIUM  7.5*   < > 7.7* 7.7* 7.6* 7.7* 7.7*  MG 2.2  --   --   --   --  1.8 2.3  PHOS  --   --   --   --   --  2.4* 2.7   < > = values in this interval not displayed.   GFR: Estimated Creatinine Clearance: 50.2 mL/min (A) (by C-G formula based on SCr of 1.3 mg/dL (H)). Recent  Labs  Lab 06/27/24 0813 06/27/24 1100 06/27/24 1600 06/27/24 1921 06/27/24 2309 06/28/24 0404 06/29/24 0410  WBC 15.1*  --  21.0*  --   --  17.2* 15.8*  LATICACIDVEN  --  8.8* 4.1* 4.3* 3.7*  --   --     Liver Function Tests: Recent Labs  Lab 06/26/24 1510 06/26/24 1850 06/28/24 0404 06/29/24 0410  AST 42* 39  --   --   ALT 32 25  --   --   ALKPHOS 222* 152*  --   --   BILITOT 1.7* 1.5*  --   --   PROT 8.2* 5.8*  --   --   ALBUMIN  3.5 2.5* 2.3* 2.4*   No results for input(s): LIPASE, AMYLASE in the last 168 hours. No results for input(s): AMMONIA in the last 168 hours.  ABG    Component Value Date/Time   PHART 7.54 (H) 06/28/2024 0404   PCO2ART 28 (L) 06/28/2024 0404   PO2ART 92 06/28/2024 0404   HCO3 23.9 06/28/2024 0404   ACIDBASEDEF 9.6 (H) 06/27/2024 0629   O2SAT 98.1 06/28/2024 0404     Coagulation Profile: Recent Labs  Lab 06/26/24 1510 06/26/24 1850 06/27/24 0813 06/28/24 0404  INR 1.4* 4.1* 2.0* 1.7*    Cardiac Enzymes: No results for input(s): CKTOTAL, CKMB, CKMBINDEX, TROPONINI in the last 168 hours.  HbA1C: Hgb A1c MFr Bld  Date/Time Value Ref Range Status  06/26/2024 03:10 PM 6.4 (H) 4.8 - 5.6 % Final    Comment:    (NOTE) Diagnosis of Diabetes The following HbA1c ranges recommended by the American Diabetes Association (ADA) may be used as an aid in the diagnosis of diabetes mellitus.  Hemoglobin             Suggested A1C NGSP%              Diagnosis  <5.7                   Non Diabetic  5.7-6.4                Pre-Diabetic  >6.4                    Diabetic  <7.0                   Glycemic control for                       adults with diabetes.    12/25/2023 10:00 PM 10.9 (H) 4.8 - 5.6 % Final    Comment:    (NOTE) Pre diabetes:          5.7%-6.4%  Diabetes:              >6.4%  Glycemic control for   <7.0% adults with diabetes     CBG: Recent Labs  Lab 06/28/24 1520 06/28/24 1920 06/28/24 2329 06/29/24 0325 06/29/24 0743  GLUCAP 193* 160* 137* 82 234*    Review of Systems:   Unable to assess due to intubation/sedation/critical illness    Past Medical History:  She,  has a past medical history of Anemia, Asthma, Back pain, Cataract, Diabetes mellitus without complication (HCC), GERD (gastroesophageal reflux disease), History of echocardiogram, Hypertension, Morbid obesity with BMI of 60.0-69.9, adult (HCC), and Restless leg syndrome.   Surgical History:   Past Surgical History:  Procedure Laterality Date   ABDOMINAL HYSTERECTOMY     CARDIOVERSION N/A 12/28/2023  Procedure: CARDIOVERSION;  Surgeon: Darliss Rogue, MD;  Location: [facility] ORS;  Service: Cardiovascular;  Laterality: N/A;   CATARACT EXTRACTION W/PHACO Left 04/13/2020   Procedure: CATARACT EXTRACTION PHACO AND INTRAOCULAR LENS PLACEMENT (IOC);  Surgeon: Ferol Rogue, MD;  Location: [facility] ORS;  Service: Ophthalmology;  Laterality: Left;  US  00:36.0 CDE 5.95 Fluid Pack lot # [id number]   CORONARY THROMBECTOMY N/A 06/26/2024   Procedure: Coronary Thrombectomy;  Surgeon: Florencio Cara BIRCH, MD;  Location: [facility] INVASIVE CV LAB;  Service: Cardiovascular;  Laterality: N/A;   CORONARY/GRAFT ACUTE MI REVASCULARIZATION N/A 06/26/2024   Procedure: Coronary/Graft Acute MI Revascularization;  Surgeon: Florencio Cara BIRCH, MD;  Location: [facility] INVASIVE CV LAB;  Service: Cardiovascular;  Laterality: N/A;   HYSTEROSCOPY WITH D & C N/A 07/14/2017   Procedure: DILATATION AND CURETTAGE /HYSTEROSCOPY;  Surgeon: Arloa Lamar SQUIBB, MD;  Location: [facility] ORS;  Service:  Gynecology;  Laterality: N/A;   IR IMAGING GUIDED PORT INSERTION  12/17/2023   LEFT HEART CATH AND CORONARY ANGIOGRAPHY N/A 06/26/2024   Procedure: LEFT HEART CATH AND CORONARY ANGIOGRAPHY;  Surgeon: Florencio Cara BIRCH, MD;  Location: [facility] INVASIVE CV LAB;  Service: Cardiovascular;  Laterality: N/A;   POLYPECTOMY  2015   TEE WITHOUT CARDIOVERSION N/A 12/28/2023   Procedure: ECHOCARDIOGRAM, TRANSESOPHAGEAL;  Surgeon: Darliss Rogue, MD;  Location: [facility] ORS;  Service: Cardiovascular;  Laterality: N/A;     Social History:   reports that she has never smoked. She has never used smokeless tobacco. She reports that she does not drink alcohol and does not use drugs.   Family History:  Her family history includes Breast cancer (age of onset: 66) in her mother; Diabetes in her father and mother; Hypertension in her father and mother; Ovarian cancer in her paternal aunt.   Allergies Allergies  Allergen Reactions   Peanut-Containing Drug Products      Home Medications  Prior to Admission medications   Medication Sig Start Date End Date Taking? Authorizing Provider  acetaminophen  (TYLENOL  8 HOUR ARTHRITIS PAIN) 650 MG CR tablet Take 3 tablets (1,950 mg total) by mouth every 8 (eight) hours as needed for pain. Do not exceed 4000 mg total in a day.  Home med. 04/21/23   Awanda City, MD  amLODipine  (NORVASC ) 10 MG tablet Take 10 mg by mouth daily. 03/28/24   [provider]  apixaban  (ELIQUIS ) 5 MG TABS tablet Take 1 tablet (5 mg total) by mouth 2 (two) times daily. 05/19/24   Babara Call, MD  azelastine  (ASTELIN ) 0.1 % nasal spray Place 1 spray into both nostrils 2 (two) times daily. 10/22/23   [provider]  baclofen  (LIORESAL ) 10 MG tablet Take 10 mg by mouth 3 (three) times daily.    [provider]  dexamethasone  (DECADRON ) 4 MG tablet Take 1 tablet (4 mg total) by mouth daily. Start the day after irinotecan chemotherapy for 2 days. Take with food. 06/16/24   Babara Call, MD   diphenoxylate -atropine  (LOMOTIL ) 2.5-0.025 MG tablet Take 2 tablets by mouth every 6 (six) hours as needed (for diarrhea unrelieved by imodium). 12/21/23   Dasie Tinnie MATSU, NP  EPINEPHrine  0.3 mg/0.3 mL IJ SOAJ injection Inject 0.3 mg into the muscle as needed for anaphylaxis. 09/19/20   Angelena Smalls, MD  fluticasone  (FLONASE ) 50 MCG/ACT nasal spray Place 1 spray into both nostrils daily. 09/29/23   Patel, Sona, MD  fluticasone  furoate-vilanterol (BREO ELLIPTA ) 100-25 MCG/ACT AEPB Inhale 1 puff into the lungs daily. 09/29/23   Patel, Sona, MD  furosemide  (LASIX )  40 MG tablet Take 40 mg by mouth daily. 03/23/20   [provider]  gabapentin  (NEURONTIN ) 300 MG capsule Take 1 capsule (300 mg total) by mouth 2 (two) times daily. 04/20/24   Borders, Fonda SAUNDERS, NP  ipratropium-albuterol  (DUONEB) 0.5-2.5 (3) MG/3ML SOLN Take 3 mLs by nebulization every 4 (four) hours as needed (wheezing / shortness of breath). 09/29/23   Patel, Sona, MD  levocetirizine (XYZAL ) 5 MG tablet Take 5 mg by mouth daily. 10/21/23   [provider]  lidocaine -prilocaine  (EMLA ) cream Apply 1 Application topically as needed. Apply to port and cover with saran wrap 1-2 hours prior to port access 01/08/24   Babara Call, MD  loperamide (IMODIUM) 2 MG capsule Take 1 capsule (2 mg total) by mouth See admin instructions. Initial: 4 mg with onset of diarrhea, then 2 mg every 2 hours, or after each unformed stool. Maximum: 16 mg/day 06/16/24   Yu, Zhou, MD  magic mouthwash (lidocaine , diphenhydrAMINE , alum & mag hydroxide) suspension Swish and swallow 5 mLs 4 (four) times daily as needed for mouth pain. 12/18/23   Dasie Tinnie MATSU, NP  metFORMIN  (GLUCOPHAGE ) 1000 MG tablet Take 1,000 mg by mouth 2 (two) times daily. 11/27/22   [provider]  metoprolol  tartrate (LOPRESSOR ) 25 MG tablet Take 1 tablet (25 mg total) by mouth 2 (two) times daily. 12/30/23 06/16/24  Trudy Anthony HERO, MD  morphine  (MS CONTIN ) 15 MG 12 hr tablet Take 1  tablet (15 mg total) by mouth every 12 (twelve) hours. 05/04/24   Borders, Fonda SAUNDERS, NP  naloxone  (NARCAN ) nasal spray 4 mg/0.1 mL SPRAY 1 SPRAY INTO ONE NOSTRIL AS DIRECTED FOR OPIOID OVERDOSE (TURN PERSON ON SIDE AFTER DOSE. IF NO RESPONSE IN 2-3 MINUTES OR PERSON RESPONDS BUT RELAPSES, REPEAT USING A NEW SPRAY DEVICE AND SPRAY INTO THE OTHER NOSTRIL. CALL 911 AFTER USE.) * EMERGENCY USE ONLY * Patient not taking: Reported on 06/16/2024 11/20/23   Borders, Fonda SAUNDERS, NP  ondansetron  (ZOFRAN -ODT) 8 MG disintegrating tablet Take 1 tablet (8 mg total) by mouth every 8 (eight) hours as needed for nausea or vomiting. Do not take within 72 hours of chemotherapy. 06/16/24   Babara Call, MD  oxyCODONE  (OXY IR/ROXICODONE ) 5 MG immediate release tablet Take 1 tablet (5 mg total) by mouth every 4 (four) hours as needed for moderate pain (pain score 4-6) or severe pain (pain score 7-10). 06/02/24   Babara Call, MD  potassium chloride  (KLOR-CON  M) 10 MEQ tablet Take 1 tablet (10 mEq total) by mouth daily. 05/19/24   Babara Call, MD  prochlorperazine  (COMPAZINE ) 10 MG tablet Take 1 tablet (10 mg total) by mouth every 6 (six) hours as needed for nausea or vomiting. 06/16/24   Babara Call, MD  rOPINIRole  (REQUIP ) 2 MG tablet Take 2 mg by mouth in the morning and at bedtime.     [provider]  spironolactone  (ALDACTONE ) 50 MG tablet Take 1 tablet (50 mg total) by mouth daily. 02/01/24   Amin, Sumayya, MD  traZODone  (DESYREL ) 50 MG tablet Take 0.5-1 tablets (25-50 mg total) by mouth at bedtime as needed for sleep. 01/08/24   Borders, Fonda SAUNDERS, NP  VENTOLIN  HFA 108 (90 Base) MCG/ACT inhaler Inhale 2 puffs into the lungs every 4 (four) hours as needed for wheezing or shortness of breath. 09/29/23   Patel, Sona, MD     Robet Kim, PA-C [facility] PCCM Team Contact Info: [phone]   I agree with the impression and  plan as outlined by PA Bousman with emphasis on the following:   #Hemorrhagic  Shock #Stroke #RCA STEMI #Acute Hypoxic Respiratory Failure #Toxic Metabolic Encephalopathy #Afib #Acute Blood Loss Anemia #AKI #Stage IV Pancreatic Cancer   Patient is a 63 year old female admitted to the hospital with RCA STEMI status post left heart cath with stent placement.  Developed severe hypotension and circulatory shock postprocedure with subsequent development of encephalopathy and respiratory failure.  She had a significant drop in her hemoglobin suggesting hemorrhagic shock as a possible etiology.   Hemoglobin dropped from 12.4 preprocedure to 7 postprocedure, likely blood loss from radial artery bleed. No sign of retroperitoneal bleed on her abdominal CT. She was initiated on massive transfusion protocol with goal hemoglobin of greater than 8 given ACS with improvement in her vasopressor requirements and blood pressure.  Furthermore her lactic acid has also downtrended.  Echocardiogram does not show of RV dysfunction which is reassuring and argues against cardiogenic shock.   Appreciate input from cardiology, she will continue on antiplatelet therapy while rest of anticoagulation will be held.  Will continue to closely monitor her hemoglobin and work of fluid mobilization as well as weaning her off vasopressors (norepinephrine dose has decreased) and sedation. Will attempt WUA and SBT as tolerated. CT abdomen does show a consolidated LLL and we are treating her for an aspiration pneumonia with broad spectrum antibiotics pending cultures.  Today, patient failed WUA and showed left sided weakness, MRI obtained and showed large right sided stroke. Discussed with daughter at bedside and she would like to proceed with comfort measures only.   The patient is critically ill due to acute hypoxic respiratory failure, stroke, RCA STEMI.  Critical care was necessary to treat or prevent imminent or life-threatening deterioration. I personally performed high risk medication and infusion titration  and management, titration, monitoring, and management of vasopressor/ionotrope infusion, and mechanical ventilation management and titration. Critical care time was spent by me on the following activities: development of a treatment plan with the patient and/or surrogate as well as nursing, discussions with consultants, evaluation of the patient's response to treatment, examination of the patient, obtaining a history from the patient or surrogate, ordering and performing treatments and interventions, ordering and review of laboratory studies, ordering and review of radiographic studies, review of telemetry data including pulse oximetry, re-evaluation of patient's condition and participation in multidisciplinary rounds.   I personally spent 56 minutes providing critical care not including any separately billable procedures.    Belva November, MD [facility] 06/29/2024 3:54 PM

## 2024-06-29 NOTE — IPAL (Signed)
  Interdisciplinary Goals of Care Family Meeting   Date carried out: 06/29/2024  Location of the meeting: Bedside  Member's involved: Physician and Family Member or next of kin  Durable Power of Attorney or acting medical decision maker: Daughter, Harlene Lesches.    Discussion: We discussed goals of care for [facility]. Discussed Ms. Dittrich's clinical status with her daughter, including admission for STEMI, respiratory failure, hemorrhagic shock, and respiratory failure. Also explained new finding of left sided weakness, and MRI performed today showing large left sided stroke.  Discussed with daughter that with her underlying stage IV pancreatic cancer, her prognosis is quite grim, and she would not be a candidate for further chemotherapy. Daughter expressed that patient would not want to be dependent on machines to stay alive.  Daughter requested DNR code status, and would like to proceed with withdrawal of care and focus on comfort measures only. She wants her son to see his grandmother before withdrawing care.  Code status:   Code Status: Limited: Do not attempt resuscitation (DNR) -DNR-LIMITED -Do Not Intubate/DNI    Disposition: In-patient comfort care  Time spent for the meeting: 15 minutes    Belva November, MD  06/29/2024, 3:47 PM

## 2024-06-30 ENCOUNTER — Other Ambulatory Visit: Payer: Self-pay | Admitting: Oncology

## 2024-06-30 DIAGNOSIS — I2111 ST elevation (STEMI) myocardial infarction involving right coronary artery: Secondary | ICD-10-CM | POA: Diagnosis not present

## 2024-06-30 DIAGNOSIS — I213 ST elevation (STEMI) myocardial infarction of unspecified site: Secondary | ICD-10-CM | POA: Diagnosis not present

## 2024-06-30 DIAGNOSIS — Z515 Encounter for palliative care: Secondary | ICD-10-CM

## 2024-06-30 DIAGNOSIS — R571 Hypovolemic shock: Secondary | ICD-10-CM | POA: Diagnosis not present

## 2024-06-30 DIAGNOSIS — J9601 Acute respiratory failure with hypoxia: Secondary | ICD-10-CM | POA: Diagnosis not present

## 2024-06-30 DIAGNOSIS — G929 Unspecified toxic encephalopathy: Secondary | ICD-10-CM | POA: Diagnosis not present

## 2024-06-30 LAB — CBC
HCT: 23.5 % — ABNORMAL LOW (ref 36.0–46.0)
Hemoglobin: 8 g/dL — ABNORMAL LOW (ref 12.0–15.0)
MCH: 30.4 pg (ref 26.0–34.0)
MCHC: 34 g/dL (ref 30.0–36.0)
MCV: 89.4 fL (ref 80.0–100.0)
Platelets: 63 K/uL — ABNORMAL LOW (ref 150–400)
RBC: 2.63 MIL/uL — ABNORMAL LOW (ref 3.87–5.11)
RDW: 16.4 % — ABNORMAL HIGH (ref 11.5–15.5)
WBC: 15.1 K/uL — ABNORMAL HIGH (ref 4.0–10.5)
nRBC: 1 % — ABNORMAL HIGH (ref 0.0–0.2)

## 2024-06-30 LAB — GLUCOSE, CAPILLARY
Glucose-Capillary: 213 mg/dL — ABNORMAL HIGH (ref 70–99)
Glucose-Capillary: 214 mg/dL — ABNORMAL HIGH (ref 70–99)
Glucose-Capillary: 225 mg/dL — ABNORMAL HIGH (ref 70–99)
Glucose-Capillary: 227 mg/dL — ABNORMAL HIGH (ref 70–99)
Glucose-Capillary: 275 mg/dL — ABNORMAL HIGH (ref 70–99)
Glucose-Capillary: 284 mg/dL — ABNORMAL HIGH (ref 70–99)
Glucose-Capillary: 294 mg/dL — ABNORMAL HIGH (ref 70–99)
Glucose-Capillary: 313 mg/dL — ABNORMAL HIGH (ref 70–99)

## 2024-06-30 LAB — RENAL FUNCTION PANEL
Albumin: 2.3 g/dL — ABNORMAL LOW (ref 3.5–5.0)
Anion gap: 10 (ref 5–15)
BUN: 26 mg/dL — ABNORMAL HIGH (ref 8–23)
CO2: 24 mmol/L (ref 22–32)
Calcium: 7.4 mg/dL — ABNORMAL LOW (ref 8.9–10.3)
Chloride: 108 mmol/L (ref 98–111)
Creatinine, Ser: 1.17 mg/dL — ABNORMAL HIGH (ref 0.44–1.00)
GFR, Estimated: 52 mL/min — ABNORMAL LOW (ref >60)
Glucose, Bld: 346 mg/dL — ABNORMAL HIGH (ref 70–99)
Phosphorus: 2.9 mg/dL (ref 2.5–4.6)
Potassium: 3.7 mmol/L (ref 3.5–5.1)
Sodium: 143 mmol/L (ref 135–145)

## 2024-06-30 LAB — LEGIONELLA PNEUMOPHILA SEROGP 1 UR AG: L. pneumophila Serogp 1 Ur Ag: NEGATIVE

## 2024-06-30 LAB — MAGNESIUM: Magnesium: 2.3 mg/dL (ref 1.7–2.4)

## 2024-06-30 LAB — STREP PNEUMONIAE URINARY ANTIGEN: Strep Pneumo Urinary Antigen: NEGATIVE

## 2024-06-30 MED ORDER — INSULIN ASPART 100 UNIT/ML IJ SOLN
0.0000 [IU] | INTRAMUSCULAR | Status: DC
  Administered 2024-06-30 – 2024-07-01 (×5): 7 [IU] via SUBCUTANEOUS
  Administered 2024-07-01: 4 [IU] via SUBCUTANEOUS
  Administered 2024-07-01: 11 [IU] via SUBCUTANEOUS
  Administered 2024-07-01: 7 [IU] via SUBCUTANEOUS
  Filled 2024-06-30: qty 7
  Filled 2024-06-30: qty 11
  Filled 2024-06-30 (×5): qty 7
  Filled 2024-06-30: qty 4

## 2024-06-30 MED ORDER — INSULIN ASPART 100 UNIT/ML IJ SOLN
10.0000 [IU] | Freq: Once | INTRAMUSCULAR | Status: AC
  Administered 2024-06-30: 10 [IU] via SUBCUTANEOUS
  Filled 2024-06-30: qty 10

## 2024-06-30 NOTE — Inpatient Diabetes Management (Signed)
 Inpatient Diabetes Program Recommendations  AACE/ADA: New Consensus Statement on Inpatient Glycemic Control   Target Ranges:  Prepandial:   less than 140 mg/dL      Peak postprandial:   less than 180 mg/dL (1-2 hours)      Critically ill patients:  140 - 180 mg/dL    Latest Reference Range & Units 06/29/24 07:43 06/29/24 11:36 06/29/24 15:47 06/29/24 19:47 06/29/24 23:57 06/29/24 23:59 06/30/24 00:01 06/30/24 00:05 06/30/24 03:53  Glucose-Capillary 70 - 99 mg/dL 765 (H) 862 (H) 849 (H) 108 (H) <10 (LL) 284 (H) 313 (H) 294 (H) 275 (H)   Review of Glycemic Control  Diabetes history: DM2 Outpatient Diabetes medications: Metformin  1000 mg BID (has been on Ozempic in the past); Decadron  4 mg daily after chemotherapy x 2 days Current orders for Inpatient glycemic control: Novolog  0-20 units Q4H; Vital @ 50 ml/hr  Inpatient Diabetes Program Recommendations:    Insulin :  If appropriate for patient situation and if tube feedings are continued, may want to consider ordering Novolog  4 units Q4H for tube feeding coverage. If tube feeding is stopped or held then Novolog  tube feeding coverage should also be stopped or held.  Thanks, Earnie Gainer, RN, MSN, CDCES Diabetes Coordinator Inpatient Diabetes Program [phone] (Team Pager from 8am to 5pm)

## 2024-06-30 NOTE — Progress Notes (Signed)
 NAME:  Kristina Cruz, MRN:  [id number], DOB:  18-Aug-1961, LOS: 4 ADMISSION DATE:  06/26/2024, CONSULTATION DATE:  06/27/2024 REFERRING MD:  Dr. Manfred, CHIEF COMPLAINT:  Hypotension   Brief Pt Description / Synopsis:  63 y.o. female with PMHx significant for Stage IV Pancreatic cancer undergoing chemotherapy who is admitted with inferior STEMI requiring PCI and DES to the distal RCA.  Course complicated by hemorrhagic shock due to bleeding from right femoral/radial cath puncture sites, metabolic acidosis, acute hypoxic respiratory failure, and acute metabolic encephalopathy requiring intubation and mechanical ventilation.  History of Present Illness:  Kristina Cruz is a 63 y.o. female with a  past medical history of paroxysmal atrial fibrillation on apixaban  status post cardioversion, stage IV pancreatic cancer with mets to the liver on active chemo, type 2 diabetes, morbid obesity, COPD, asthma, hypertension, GERD, RLS, and chronic anemia who presented to [facility] ED on 06/26/24 with acute chest discomfort.  She reported the chest pain started about an hour prior to presentation (rated as 11/10 and mid sternal) and felt like an with associated shortness of breath and diaphoresis.  In the ED she was found to have an inferior STEMI with reciprocal changes.  She was given Heparin , nitroglycerin, and aspirin .  Cardiology was consulted and she was take for emergent cardiac.  She was found to have occlusion of the distal RCA requiring PCI and DES to the distal RCA.  Post cath she was noted to have bleeding for her right radial wrist site and with a right groin hematoma.  TRH asked to admit for further workup and treatment, however after arrival to ICU following cath, she was noted to be hypotensive.  PCCM was consulted.   Please see Significant Hospital Events section below for full detailed hospital course.   Pertinent  Medical History   Past Medical History:  Diagnosis Date   Anemia     Asthma    Back pain    Cataract    Diabetes mellitus without complication (HCC)    GERD (gastroesophageal reflux disease)    History of echocardiogram    Hypertension    Morbid obesity with BMI of 60.0-69.9, adult (HCC)    Restless leg syndrome     Micro Data:  11/9: MRSA PCR>> negative 11/9: Blood cultures x2>> no growth to date 11/10: Tracheal aspirate>> gram + cocci  Antimicrobials:   Anti-infectives (From admission, onward)    Start     Dose/Rate Route Frequency Ordered Stop   06/27/24 1645  piperacillin-tazobactam (ZOSYN) IVPB 3.375 g        3.375 g 12.5 mL/hr over 240 Minutes Intravenous Every 8 hours 06/27/24 1554          Significant Hospital Events: Including procedures, antibiotic start and stop dates in addition to other pertinent events   11/9: Presented to ED with chest pain, found to have inferior ST elevation on EKG, Cardiac consulted and emergency cath performed with PCI and stent to distal RCA.  Post cath developed right groin hematoma and hypotension concerning for possible hemorrhagic shock.  Was placed on vasopressors, PCCM consulted. 10/10: Earlier this morning became obtunded requiring intubation.  Remains critically ill, requiring Levophed.  Massive transfusion protocol initiated (received 4 pRBC's, 2 FFP, 2 Cryo).  CT Abdomen & Pelvis without evidence of retroperitoneal bleed, did show mild stranding/hemorrhage in right inguinal region and medial right upper thigh. 11/11: No significant events overnight.  Hgb stable overnight, no additional blood products required.  Weaning Levophed (down to 16 mcg currently).  Fever overnight (T Max 101.6), Tracheal aspirate with gram + cocci, will continue Zosyn for now.  On minimal vent support, plan for WUA/SBT later today as tolerated.  11/12: No significant overnight events. Slight decline in Hgb at 9.2. No longer requiring levophed. Tmax overnight 100.8, remains on Zosyn. Weaned off precedex. On minimal vent support,  placed in SBT and tolerated fairly well.  11/13: Restarted levophed and precedex last night. No acute changes. Plan for comfort care.  Interim History / Subjective:  As outlined above under Significant Hospital Events section  Objective   Blood pressure 97/60, pulse 75, temperature 99.1 F (37.3 C), resp. rate (!) 24, height 5' 1 (1.549 m), weight 112.5 kg, last menstrual period 01/04/2018, SpO2 95%. CVP:  [2 mmHg-5 mmHg] 2 mmHg  Vent Mode: PRVC FiO2 (%):  [24 %-35 %] 30 % Set Rate:  [20 bmp] 20 bmp Vt Set:  [450 mL] 450 mL PEEP:  [5 cmH20-7 cmH20] 5 cmH20 Pressure Support:  [7 cmH20] 7 cmH20 Plateau Pressure:  [16 cmH20] 16 cmH20   Intake/Output Summary (Last 24 hours) at 06/30/2024 0820 Last data filed at 06/30/2024 9389 Gross per 24 hour  Intake 1861.9 ml  Output 1058 ml  Net 803.9 ml   Filed Weights   06/26/24 2010 06/28/24 0315 06/29/24 0330  Weight: 110.8 kg 115 kg 112.5 kg    Examination: General: Critically ill-appearing female, laying in bed, intubated and sedated, no acute distress HENT: Atraumatic, normocephalic, neck supple, difficult to assess JVD due to body habitus, orally intubated Lungs: Mechanical breath sounds throughout, even, nonlabored, occasionally overbreathing the vent Cardiovascular: Regular rate and rhythm, S1-S2, no murmurs, rubs, gallops Abdomen: Obese, soft, nontender, nondistended, no rebound/guarding, bowel sounds  x 4 Extremities: cool, dry, intact, no edema, radial pulses 2+, distal pulses 1+ Neuro: no longer sedated, withdraws from painful stimuli only on the right side, follows commands on right side, PERRL GU: Foley catheter in place draining yellow urine  Resolved Hospital Problem list     Assessment & Plan:   #Large Right MCA with Midline Shift #Acute Metabolic Encephalopathy #Sedation needs in setting of mechanical ventilation 11/12 Brain MRI: Large subacute right MCA infarct with petechial hemorrhage and 4 mm leftward midline  shift. Numerous small early subacute bilateral cerebral and cerebellar infarcts. - PAD protocol - Restarted precedex - Supportive measures - Control pain as able - Plan to transition to comfort care on 11/14  #Shock: Hemorrhagic +/- Septic ~ Resolved #Inferior STEMI s/p PCI #Paroxsymal Atrial Fibrillation #Acute Decompensated HFmrEF PMHx: HTN, PAF ECHO 06/27/24: LVEF 45-50%, indeterminate diastolic parameters, RV systolic function is normal, RV size is normal - Continuous cardiac monitoring - Vasopressors to maintain MAP >65 ~ restarted levophed - IV fluids with caution - Lactic peaked and has been trending down - Troponin peaked at >24,000 - Continue holding anticoagulation given hemorrhagic shock  #Acute Hypoxic Respiratory Failure secondary to Hemorrhagic Shock #Lobar Pneumonia  PMHx: COPD, asthma  - Full ventilator support - VAP bundle - Maintain plateau pressures less than 30 cm H2O - Failed SBT yesterday - Will transition to CMO 11/14  #Lobar Pneumonia - Trend WBC and monitor fever curve - Blood cultures pending - Continue ABX: Zosyn - Narrow ABX pending culture and sensitivity results  #AKI secondary to Hemorrhagic Shock #Anion Gap Metabolic Acidosis due to Lactic Acidosis ~ RESOLVED #Hypokalemia ~ RESOLVED  - Strict I/O's - Trend BMP and monitor renal function - Creatinine improving - Avoid nephrotoxins as able - Ensure adequate renal  perfusion - ICU electrolyte replacement protocol - Pharmacy to assist with replacement  #Stage IV Pancreatic Cancer #GI Prophylaxis - GI PPX: Continue Protonix  40mg  - Most recent imaging is stable from previous  #Acute Blood Loss Anemia #Thrombocytopenia 11/10 CT Abdomen & Pelvis:  no evidence of abdominal wall or retroperitoneal bleed; mild stranding/hemorrhage in the right inguinal region and medial right upper thigh  (likely postprocedural) - Trend CBC, monitor H/H and platelets - Transfuse if Hgb < 8; 8.0 this am and  platelets 63 - Massive transfusion protocol administered 11/10; has not required since - DVT prophylaxis: SCD - Monitor for s/sx of bleeding - Transfuse Platelets if count < 10K; < 50K with active bleeding  #Type II Diabetes Mellitus  - ICU hypo/hyperglycemia protocol - BG Range: 140-180s - Continue Novolog  - CBG Q4h   Best Practice (right click and Reselect all SmartList Selections daily)   Diet/type: NPO, if fails SBT then will start tube feeds DVT prophylaxis: SCD GI prophylaxis: PPI Lines: Central line and yes and it is still needed, arterial line still needed Foley:  Yes, and it is still needed Code Status:  full code Last date of multidisciplinary goals of care discussion [11/11]   Labs   CBC: Recent Labs  Lab 06/26/24 1510 06/26/24 1850 06/27/24 0320 06/27/24 0813 06/27/24 1600 06/27/24 1921 06/27/24 2258 06/28/24 0404 06/28/24 1035 06/29/24 0410 06/30/24 0552  WBC 9.4 12.7*   < > 15.1* 21.0*  --   --  17.2*  --  15.8* 15.1*  NEUTROABS 7.6 10.5*  --   --   --   --   --   --   --   --   --   HGB 12.0 8.2*   < > 8.3* 9.8*   < > 10.6* 10.3* 10.0* 9.2* 8.0*  HCT 36.3 25.1*   < > 24.6* 26.7*   < > 28.9* 28.1* 28.2* 26.0* 23.5*  MCV 94.5 96.2   < > 91.8 86.1  --   --  83.6  --  87.0 89.4  PLT 133* 171   < > 92*  93* 82*  --   --  49*  --  49* 63*   < > = values in this interval not displayed.    Basic Metabolic Panel: Recent Labs  Lab 06/26/24 1850 06/26/24 2302 06/27/24 1100 06/27/24 1600 06/28/24 0404 06/29/24 0410 06/30/24 0552  NA 132*   < > 138 139 138 142 143  K 3.4*   < > 3.2* 4.3 4.3 3.8 3.7  CL 97*   < > 102 102 103 107 108  CO2 19*   < > 19* 23 23 24 24   GLUCOSE 541*   < > 143* 38* 200* 209* 346*  BUN 21   < > 23 26* 26* 23 26*  CREATININE 0.99   < > 1.17* 1.29* 1.42* 1.30* 1.17*  CALCIUM  7.5*   < > 7.7* 7.6* 7.7* 7.7* 7.4*  MG 2.2  --   --   --  1.8 2.3 2.3  PHOS  --   --   --   --  2.4* 2.7 2.9   < > = values in this interval not  displayed.   GFR: Estimated Creatinine Clearance: 55.8 mL/min (A) (by C-G formula based on SCr of 1.17 mg/dL (H)). Recent Labs  Lab 06/27/24 1100 06/27/24 1600 06/27/24 1921 06/27/24 2309 06/28/24 0404 06/29/24 0410 06/30/24 0552  WBC  --  21.0*  --   --  17.2*  15.8* 15.1*  LATICACIDVEN 8.8* 4.1* 4.3* 3.7*  --   --   --     Liver Function Tests: Recent Labs  Lab 06/26/24 1510 06/26/24 1850 06/28/24 0404 06/29/24 0410 06/30/24 0552  AST 42* 39  --   --   --   ALT 32 25  --   --   --   ALKPHOS 222* 152*  --   --   --   BILITOT 1.7* 1.5*  --   --   --   PROT 8.2* 5.8*  --   --   --   ALBUMIN  3.5 2.5* 2.3* 2.4* 2.3*   No results for input(s): LIPASE, AMYLASE in the last 168 hours. No results for input(s): AMMONIA in the last 168 hours.  ABG    Component Value Date/Time   PHART 7.54 (H) 06/28/2024 0404   PCO2ART 28 (L) 06/28/2024 0404   PO2ART 92 06/28/2024 0404   HCO3 23.9 06/28/2024 0404   ACIDBASEDEF 9.6 (H) 06/27/2024 0629   O2SAT 98.1 06/28/2024 0404     Coagulation Profile: Recent Labs  Lab 06/26/24 1510 06/26/24 1850 06/27/24 0813 06/28/24 0404  INR 1.4* 4.1* 2.0* 1.7*    Cardiac Enzymes: No results for input(s): CKTOTAL, CKMB, CKMBINDEX, TROPONINI in the last 168 hours.  HbA1C: Hgb A1c MFr Bld  Date/Time Value Ref Range Status  06/26/2024 03:10 PM 6.4 (H) 4.8 - 5.6 % Final    Comment:    (NOTE) Diagnosis of Diabetes The following HbA1c ranges recommended by the American Diabetes Association (ADA) may be used as an aid in the diagnosis of diabetes mellitus.  Hemoglobin             Suggested A1C NGSP%              Diagnosis  <5.7                   Non Diabetic  5.7-6.4                Pre-Diabetic  >6.4                   Diabetic  <7.0                   Glycemic control for                       adults with diabetes.    12/25/2023 10:00 PM 10.9 (H) 4.8 - 5.6 % Final    Comment:    (NOTE) Pre diabetes:           5.7%-6.4%  Diabetes:              >6.4%  Glycemic control for   <7.0% adults with diabetes     CBG: Recent Labs  Lab 06/29/24 2357 06/29/24 2359 06/30/24 0001 06/30/24 0005 06/30/24 0353  GLUCAP <10* 284* 313* 294* 275*    Review of Systems:   Unable to assess due to intubation/sedation/critical illness    Past Medical History:  She,  has a past medical history of Anemia, Asthma, Back pain, Cataract, Diabetes mellitus without complication (HCC), GERD (gastroesophageal reflux disease), History of echocardiogram, Hypertension, Morbid obesity with BMI of 60.0-69.9, adult (HCC), and Restless leg syndrome.   Surgical History:   Past Surgical History:  Procedure Laterality Date   ABDOMINAL HYSTERECTOMY     CARDIOVERSION N/A 12/28/2023   Procedure: CARDIOVERSION;  Surgeon: Darliss Rogue, MD;  Location: [facility]  ORS;  Service: Cardiovascular;  Laterality: N/A;   CATARACT EXTRACTION W/PHACO Left 04/13/2020   Procedure: CATARACT EXTRACTION PHACO AND INTRAOCULAR LENS PLACEMENT (IOC);  Surgeon: Ferol Rogue, MD;  Location: [facility] ORS;  Service: Ophthalmology;  Laterality: Left;  US  00:36.0 CDE 5.95 Fluid Pack lot # [id number]   CORONARY THROMBECTOMY N/A 06/26/2024   Procedure: Coronary Thrombectomy;  Surgeon: Florencio Cara BIRCH, MD;  Location: [facility] INVASIVE CV LAB;  Service: Cardiovascular;  Laterality: N/A;   CORONARY/GRAFT ACUTE MI REVASCULARIZATION N/A 06/26/2024   Procedure: Coronary/Graft Acute MI Revascularization;  Surgeon: Florencio Cara BIRCH, MD;  Location: [facility] INVASIVE CV LAB;  Service: Cardiovascular;  Laterality: N/A;   HYSTEROSCOPY WITH D & C N/A 07/14/2017   Procedure: DILATATION AND CURETTAGE /HYSTEROSCOPY;  Surgeon: Arloa Lamar SQUIBB, MD;  Location: [facility] ORS;  Service: Gynecology;  Laterality: N/A;   IR IMAGING GUIDED PORT INSERTION  12/17/2023   LEFT HEART CATH AND CORONARY ANGIOGRAPHY N/A 06/26/2024   Procedure: LEFT HEART CATH AND CORONARY ANGIOGRAPHY;  Surgeon: Florencio Cara BIRCH, MD;  Location: [facility] INVASIVE CV LAB;  Service: Cardiovascular;  Laterality: N/A;   POLYPECTOMY  2015   TEE WITHOUT CARDIOVERSION N/A 12/28/2023   Procedure: ECHOCARDIOGRAM, TRANSESOPHAGEAL;  Surgeon: Darliss Rogue, MD;  Location: [facility] ORS;  Service: Cardiovascular;  Laterality: N/A;     Social History:   reports that she has never smoked. She has never used smokeless tobacco. She reports that she does not drink alcohol and does not use drugs.   Family History:  Her family history includes Breast cancer (age of onset: 54) in her mother; Diabetes in her father and mother; Hypertension in her father and mother; Ovarian cancer in her paternal aunt.   Allergies Allergies  Allergen Reactions   Peanut-Containing Drug Products      Home Medications  Prior to Admission medications   Medication Sig Start Date End Date Taking? Authorizing Provider  acetaminophen  (TYLENOL  8 HOUR ARTHRITIS PAIN) 650 MG CR tablet Take 3 tablets (1,950 mg total) by mouth every 8 (eight) hours as needed for pain. Do not exceed 4000 mg total in a day.  Home med. 04/21/23   Awanda City, MD  amLODipine  (NORVASC ) 10 MG tablet Take 10 mg by mouth daily. 03/28/24   [provider]  apixaban  (ELIQUIS ) 5 MG TABS tablet Take 1 tablet (5 mg total) by mouth 2 (two) times daily. 05/19/24   Babara Call, MD  azelastine  (ASTELIN ) 0.1 % nasal spray Place 1 spray into both nostrils 2 (two) times daily. 10/22/23   [provider]  baclofen  (LIORESAL ) 10 MG tablet Take 10 mg by mouth 3 (three) times daily.    [provider]  dexamethasone  (DECADRON ) 4 MG tablet Take 1 tablet (4 mg total) by mouth daily. Start the day after irinotecan chemotherapy for 2 days. Take with food. 06/16/24   Babara Call, MD  diphenoxylate -atropine  (LOMOTIL ) 2.5-0.025 MG tablet Take 2 tablets by mouth every 6 (six) hours as needed (for diarrhea unrelieved by imodium). 12/21/23   Dasie Tinnie MATSU, NP  EPINEPHrine  0.3 mg/0.3 mL IJ SOAJ  injection Inject 0.3 mg into the muscle as needed for anaphylaxis. 09/19/20   Angelena Smalls, MD  fluticasone  (FLONASE ) 50 MCG/ACT nasal spray Place 1 spray into both nostrils daily. 09/29/23   Patel, Sona, MD  fluticasone  furoate-vilanterol (BREO ELLIPTA ) 100-25 MCG/ACT AEPB Inhale 1 puff into the lungs daily. 09/29/23   Patel, Sona, MD  furosemide  (LASIX ) 40 MG tablet Take 40 mg by mouth daily. 03/23/20  [provider]  gabapentin  (NEURONTIN ) 300 MG capsule Take 1 capsule (300 mg total) by mouth 2 (two) times daily. 04/20/24   Borders, Fonda SAUNDERS, NP  ipratropium-albuterol  (DUONEB) 0.5-2.5 (3) MG/3ML SOLN Take 3 mLs by nebulization every 4 (four) hours as needed (wheezing / shortness of breath). 09/29/23   Patel, Sona, MD  levocetirizine (XYZAL ) 5 MG tablet Take 5 mg by mouth daily. 10/21/23   [provider]  lidocaine -prilocaine  (EMLA ) cream Apply 1 Application topically as needed. Apply to port and cover with saran wrap 1-2 hours prior to port access 01/08/24   Babara Call, MD  loperamide (IMODIUM) 2 MG capsule Take 1 capsule (2 mg total) by mouth See admin instructions. Initial: 4 mg with onset of diarrhea, then 2 mg every 2 hours, or after each unformed stool. Maximum: 16 mg/day 06/16/24   Yu, Zhou, MD  magic mouthwash (lidocaine , diphenhydrAMINE , alum & mag hydroxide) suspension Swish and swallow 5 mLs 4 (four) times daily as needed for mouth pain. 12/18/23   Dasie Tinnie MATSU, NP  metFORMIN  (GLUCOPHAGE ) 1000 MG tablet Take 1,000 mg by mouth 2 (two) times daily. 11/27/22   [provider]  metoprolol  tartrate (LOPRESSOR ) 25 MG tablet Take 1 tablet (25 mg total) by mouth 2 (two) times daily. 12/30/23 06/16/24  Trudy Anthony HERO, MD  morphine  (MS CONTIN ) 15 MG 12 hr tablet Take 1 tablet (15 mg total) by mouth every 12 (twelve) hours. 05/04/24   Borders, Fonda SAUNDERS, NP  naloxone  (NARCAN ) nasal spray 4 mg/0.1 mL SPRAY 1 SPRAY INTO ONE NOSTRIL AS DIRECTED FOR OPIOID OVERDOSE (TURN PERSON ON  SIDE AFTER DOSE. IF NO RESPONSE IN 2-3 MINUTES OR PERSON RESPONDS BUT RELAPSES, REPEAT USING A NEW SPRAY DEVICE AND SPRAY INTO THE OTHER NOSTRIL. CALL 911 AFTER USE.) * EMERGENCY USE ONLY * Patient not taking: Reported on 06/16/2024 11/20/23   Borders, Fonda SAUNDERS, NP  ondansetron  (ZOFRAN -ODT) 8 MG disintegrating tablet Take 1 tablet (8 mg total) by mouth every 8 (eight) hours as needed for nausea or vomiting. Do not take within 72 hours of chemotherapy. 06/16/24   Babara Call, MD  oxyCODONE  (OXY IR/ROXICODONE ) 5 MG immediate release tablet Take 1 tablet (5 mg total) by mouth every 4 (four) hours as needed for moderate pain (pain score 4-6) or severe pain (pain score 7-10). 06/02/24   Babara Call, MD  potassium chloride  (KLOR-CON  M) 10 MEQ tablet Take 1 tablet (10 mEq total) by mouth daily. 05/19/24   Babara Call, MD  prochlorperazine  (COMPAZINE ) 10 MG tablet Take 1 tablet (10 mg total) by mouth every 6 (six) hours as needed for nausea or vomiting. 06/16/24   Babara Call, MD  rOPINIRole  (REQUIP ) 2 MG tablet Take 2 mg by mouth in the morning and at bedtime.     [provider]  spironolactone  (ALDACTONE ) 50 MG tablet Take 1 tablet (50 mg total) by mouth daily. 02/01/24   Amin, Sumayya, MD  traZODone  (DESYREL ) 50 MG tablet Take 0.5-1 tablets (25-50 mg total) by mouth at bedtime as needed for sleep. 01/08/24   Borders, Fonda SAUNDERS, NP  VENTOLIN  HFA 108 (90 Base) MCG/ACT inhaler Inhale 2 puffs into the lungs every 4 (four) hours as needed for wheezing or shortness of breath. 09/29/23   Tobie Calix, MD    Critical Care Time: 50 minutes   Robet Kim, PA-C [facility] PCCM Team Contact Info: [phone]

## 2024-06-30 NOTE — Plan of Care (Signed)
   Problem: Education: Goal: Knowledge of General Education information will improve Description: Including pain rating scale, medication(s)/side effects and non-pharmacologic comfort measures Outcome: Progressing   Problem: Clinical Measurements: Goal: Ability to maintain clinical measurements within normal limits will improve Outcome: Progressing Goal: Will remain free from infection Outcome: Progressing Goal: Diagnostic test results will improve Outcome: Progressing Goal: Respiratory complications will improve Outcome: Progressing Goal: Cardiovascular complication will be avoided Outcome: Progressing   Problem: Activity: Goal: Risk for activity intolerance will decrease Outcome: Progressing   Problem: Nutrition: Goal: Adequate nutrition will be maintained Outcome: Progressing   Problem: Elimination: Goal: Will not experience complications related to bowel motility Outcome: Progressing Goal: Will not experience complications related to urinary retention Outcome: Progressing

## 2024-06-30 NOTE — Consult Note (Signed)
 Palliative Medicine [facility] at [facility] [phone] [phone]   Name: Kristina Cruz Date: 06/30/2024 MRN: [id number]  DOB: 1961/04/21  Patient Care Team: Debarah [name], FNP as PCP - General (Nurse Practitioner) Perla Evalene PARAS, MD as PCP - Cardiology (Cardiology) Babara Call, MD as Consulting Physician (Oncology) Maurie Rayfield BIRCH, RN as Oncology Nurse Navigator    REASON FOR CONSULTATION: Kristina Cruz is a 63 y.o. female with multiple medical problems including stage IV pancreatic cancer with liver metastasis on dose reduced 5-FU and liposomal irinotecan chemotherapy.  Patient was admitted to the hospital with STEMI requiring PCI.  Her hospitalization has been complicated by hemorrhagic shock and respiratory failure requiring intubation.  Patient also suffered a CVA.  Palliative care was consulted to address goals.  SOCIAL HISTORY:     reports that she has never smoked. She has never used smokeless tobacco. She reports that she does not drink alcohol and does not use drugs.  Patient never married.  Lives at home alone.  She has a daughter who is involved.  Patient also has caregivers for 20 hours weekly.  Patient previously worked as a LAWYER and in a futures trader.  ADVANCE DIRECTIVES:  Does not have  CODE STATUS: DNR  PAST MEDICAL HISTORY: Past Medical History:  Diagnosis Date   Anemia    Asthma    Back pain    Cataract    Diabetes mellitus without complication (HCC)    GERD (gastroesophageal reflux disease)    History of echocardiogram    Hypertension    Morbid obesity with BMI of 60.0-69.9, adult (HCC)    Restless leg syndrome     PAST SURGICAL HISTORY:  Past Surgical History:  Procedure Laterality Date   ABDOMINAL HYSTERECTOMY     CARDIOVERSION N/A 12/28/2023   Procedure: CARDIOVERSION;  Surgeon: Darliss Rogue, MD;  Location: [facility] ORS;  Service: Cardiovascular;  Laterality: N/A;   CATARACT  EXTRACTION W/PHACO Left 04/13/2020   Procedure: CATARACT EXTRACTION PHACO AND INTRAOCULAR LENS PLACEMENT (IOC);  Surgeon: Ferol Rogue, MD;  Location: [facility] ORS;  Service: Ophthalmology;  Laterality: Left;  US  00:36.0 CDE 5.95 Fluid Pack lot # [id number]   CORONARY THROMBECTOMY N/A 06/26/2024   Procedure: Coronary Thrombectomy;  Surgeon: Florencio Cara BIRCH, MD;  Location: [facility] INVASIVE CV LAB;  Service: Cardiovascular;  Laterality: N/A;   CORONARY/GRAFT ACUTE MI REVASCULARIZATION N/A 06/26/2024   Procedure: Coronary/Graft Acute MI Revascularization;  Surgeon: Florencio Cara BIRCH, MD;  Location: [facility] INVASIVE CV LAB;  Service: Cardiovascular;  Laterality: N/A;   HYSTEROSCOPY WITH D & C N/A 07/14/2017   Procedure: DILATATION AND CURETTAGE /HYSTEROSCOPY;  Surgeon: Arloa Lamar SQUIBB, MD;  Location: [facility] ORS;  Service: Gynecology;  Laterality: N/A;   IR IMAGING GUIDED PORT INSERTION  12/17/2023   LEFT HEART CATH AND CORONARY ANGIOGRAPHY N/A 06/26/2024   Procedure: LEFT HEART CATH AND CORONARY ANGIOGRAPHY;  Surgeon: Florencio Cara BIRCH, MD;  Location: [facility] INVASIVE CV LAB;  Service: Cardiovascular;  Laterality: N/A;   POLYPECTOMY  2015   TEE WITHOUT CARDIOVERSION N/A 12/28/2023   Procedure: ECHOCARDIOGRAM, TRANSESOPHAGEAL;  Surgeon: Darliss Rogue, MD;  Location: [facility] ORS;  Service: Cardiovascular;  Laterality: N/A;    HEMATOLOGY/ONCOLOGY HISTORY:  Oncology History  Malignant neoplasm of pancreas (HCC)  08/21/2023 Imaging   CT abdomen pelvis with contrast showed 1. Masslike thickening involving the proximal and mid tail of pancreas with relative hypoenhancement measures approximately 3.9 x 5.5 cm. In the absence of signs/symptoms of acute  pancreatitis underlying neoplastic process cannot be excluded. Further evaluation with nonemergent contrast enhanced MRI of the abdomen is advised. Note: Given patient's body habitus and the motion artifact observed on the current exam an MRI obtained at this time is  likely to be severely limited and likely nondiagnostic. MRI should be obtained only once the patient is clinically stable, and is able to remain motionless and breath hold. 2. Patchy ground-glass and airspace densities identified within both lower lobes. These are new compared with the previous exam and are concerning for underlying inflammatory/infectious process. 3. Sigmoid diverticulosis without signs of acute diverticulitis. 4. Marked diastasis recti with ventral herniation of the large and small bowel loops. 5. Periumbilical hernia is containing fat only.   08/24/2023 Imaging   MRI abdomen wo contrast  1. Moderately suboptimal unenhanced exam. 2. Heterogeneous masslike thickening of the pancreatic body/tail again seen. The lesion remains indeterminate on this exam. Differential diagnosis includes pancreatic tumor, autoimmune pancreatitis, mass forming chronic pancreatitis, etc. Please see above for follow-up recommendations. 3. Mild diffuse hepatic steatosis. There are indeterminate areas in the liver, which can also be better evaluated on the contrast-enhanced MRI abdomen. 4. Other observations, as described above.   11/05/2023 Initial Diagnosis   Pancreatic cancer metastasized to liver  Patient was hospitalized from 11/05/2023 - 11/10/2023 She presented to emergency room due to nausea, vomiting and abdominal pain. CT scan showed multiple hypodense hepatic lesions increasing number and size from prior imaging.  Pancreatic tail mass, hypodense lesion in the anterior spleen.  11/09/2023 ultrasound-guided biopsy was obtained. Pathology showed moderately to poorly differentiated adenocarcinoma with extensive areas of tumor necrosis. The adenocarcinoma is positive for cytokeratin 7 and CDX2 (focal).     Cytokeratin 20, TTF-1, and GATA3 are negative. The pattern of immunoreactivity is not specific and the differential diagnosis includes metastatic adenocarcinoma from the  pancreaticobiliary tract and upper GI. Intrahepatic       cholangiocarcinoma is also a diagnostic possibility. Given the radiographic  findings a pancreaticobiliary primary is favored and correlation with clinical impression is required.    11/17/2023 Cancer Staging   Staging form: Exocrine Pancreas, AJCC 8th Edition - Clinical stage from 11/17/2023: Stage IV (cT3, cNX, pM1) - Signed by Babara Call, MD on 11/17/2023 Stage prefix: Initial diagnosis    Imaging   CT abdomen pelvis w contrast  1. Multiple hypodense hepatic lesions, increased in number and size from prior imaging, highly suspicious for metastatic disease. 2. Hypodense lesion in the pancreatic tail measures 7.2 x 3.9 cm, increased in size from prior. The increase in size favors pancreatic neoplasm, with additional etiologies less likely. 3. New hypodense lesion in the anterior spleen, 13 mm, suspicious for metastatic disease. 4. Laxity of the anterior abdominal wall with midline ventral abdominal wall hernia containing small and to a lesser extent large bowel. No bowel obstruction or inflammation. 5. Colonic diverticulosis without diverticulitis.   Aortic Atherosclerosis (ICD10-I70.0)    11/27/2023 - 06/02/2024 Chemotherapy   Patient is on Treatment Plan : Gemcitabine  D1,15 q28d     06/07/2024 Imaging   CT abdomen pelvis w contrast  1. Similar size of infiltrative pancreatic body/tail primary. Similar nonspecific edema in the anterior pararenal space. Cannot exclude superimposed pancreatitis. 2. Moderate progression of hepatic metastasis. 3. New hypoattenuating pancreatic lesions. Given subcapsular position and splenic vein involvement, infarcts are favored. Cannot exclude concurrent small volume metastasis. 4. Resolved left lower lobe cavitary nodule. 5. New trace perihepatic ascites. 6. Rectal stool volume suggests constipation. 7.  Aortic Atherosclerosis  06/22/2024 -  Chemotherapy   Patient is on Treatment Plan :  PANCREAS Liposomal Irinotecan + Leucovorin + 5-FU IVCI q14d       ALLERGIES:  is allergic to peanut-containing drug products.  MEDICATIONS:  Current Facility-Administered Medications  Medication Dose Route Frequency Provider Last Rate Last Admin   acetaminophen  (TYLENOL ) tablet 650 mg  650 mg Oral Q4H PRN Kathrene Almarie Bake, NP   650 mg at 06/29/24 9776   aspirin  chewable tablet 81 mg  81 mg Per Tube Daily Clair Marolyn NOVAK, RPH   81 mg at 06/30/24 9058   Chlorhexidine  Gluconate Cloth 2 % PADS 6 each  6 each Topical Daily Adefeso, Oladapo, DO   6 each at 06/30/24 0947   dexmedetomidine (PRECEDEX) 400 MCG/100ML (4 mcg/mL) infusion  0-1.2 mcg/kg/hr Intravenous Titrated Ouma, Elizabeth Achieng, NP 22.2 mL/hr at 06/30/24 1100 0.8 mcg/kg/hr at 06/30/24 1100   dextrose  50 % solution 0-50 mL  0-50 mL Intravenous PRN Kathrene Almarie Bake, NP   50 mL at 06/27/24 1850   docusate (COLACE) 50 MG/5ML liquid 100 mg  100 mg Per Tube BID Bousman, Karlie, PA-C   100 mg at 06/29/24 2138   feeding supplement (VITAL HIGH PROTEIN) liquid 1,000 mL  1,000 mL Per Tube Continuous Dgayli, Khabib, MD 50 mL/hr at 06/30/24 1100 Infusion Verify at 06/30/24 1100   fentaNYL  (SUBLIMAZE ) injection 50 mcg  50 mcg Intravenous Q15 min PRN Bousman, Karlie, PA-C       fentaNYL  (SUBLIMAZE ) injection 50-200 mcg  50-200 mcg Intravenous Q30 min PRN Bousman, Karlie, PA-C       free water 30 mL  30 mL Per Tube Q4H Isadora Hose, MD   30 mL at 06/30/24 1343   insulin  aspart (novoLOG ) injection 0-20 Units  0-20 Units Subcutaneous Q4H Rust-Chester, Britton L, NP   7 Units at 06/30/24 1337   iohexol  (OMNIPAQUE ) 300 MG/ML solution    PRN Callwood, Dwayne D, MD   286 mL at 06/26/24 1800   norepinephrine (LEVOPHED) 16 mg in 250mL (0.064 mg/mL) premix infusion  0-40 mcg/min Intravenous Titrated Nelson, Dana G, NP 1.88 mL/hr at 06/30/24 1100 2 mcg/min at 06/30/24 1100   ondansetron  (ZOFRAN ) injection 4 mg  4 mg Intravenous Q6H PRN  Callwood, Dwayne D, MD       Oral care mouth rinse  15 mL Mouth Rinse Q2H Kathrene Almarie Bake, NP   15 mL at 06/30/24 1343   Oral care mouth rinse  15 mL Mouth Rinse PRN Kathrene Almarie Bake, NP       pantoprazole  (PROTONIX ) injection 40 mg  40 mg Intravenous QHS Isadora Hose, MD   40 mg at 06/29/24 2138   piperacillin-tazobactam (ZOSYN) IVPB 3.375 g  3.375 g Intravenous Q8H Nada Adriana BIRCH, RPH 12.5 mL/hr at 06/30/24 1342 3.375 g at 06/30/24 1342   polyethylene glycol (MIRALAX  / GLYCOLAX ) packet 17 g  17 g Per Tube Daily Bousman, Karlie, PA-C   17 g at 06/29/24 2010   prasugrel (EFFIENT) tablet 10 mg  10 mg Per Tube Daily Clair Marolyn NOVAK, RPH   10 mg at 06/30/24 0941   sodium chloride  flush (NS) 0.9 % injection 10-40 mL  10-40 mL Intracatheter PRN Adefeso, Oladapo, DO       sodium chloride  flush (NS) 0.9 % injection 3 mL  3 mL Intravenous Q12H Callwood, Dwayne D, MD   3 mL at 06/30/24 0948   sodium chloride  flush (NS) 0.9 % injection 3 mL  3 mL Intravenous  PRN Callwood, Dwayne D, MD        VITAL SIGNS: BP 99/60 (BP Location: Right Arm)   Pulse 73   Temp 99 F (37.2 C)   Resp (!) 25   Ht 5' 1 (1.549 m)   Wt 248 lb 0.3 oz (112.5 kg)   LMP 01/04/2018   SpO2 95%   BMI 46.86 kg/m  Filed Weights   06/26/24 2010 06/28/24 0315 06/29/24 0330  Weight: 244 lb 4.3 oz (110.8 kg) 253 lb 8.5 oz (115 kg) 248 lb 0.3 oz (112.5 kg)    Estimated body mass index is 46.86 kg/m as calculated from the following:   Height as of this encounter: 5' 1 (1.549 m).   Weight as of this encounter: 248 lb 0.3 oz (112.5 kg).  LABS: CBC:    Component Value Date/Time   WBC 15.1 (H) 06/30/2024 0552   HGB 8.0 (L) 06/30/2024 0552   HGB 12.4 06/16/2024 0832   HGB 12.0 04/10/2014 1001   HCT 23.5 (L) 06/30/2024 0552   HCT 38.2 04/10/2014 1001   PLT 63 (L) 06/30/2024 0552   PLT 175 06/16/2024 0832   PLT 421 04/10/2014 1001   MCV 89.4 06/30/2024 0552   MCV 83 04/10/2014 1001   NEUTROABS 10.5 (H)  06/26/2024 1850   NEUTROABS 6.3 03/20/2014 0707   LYMPHSABS 1.4 06/26/2024 1850   LYMPHSABS 0.6 (L) 03/20/2014 0707   MONOABS 0.5 06/26/2024 1850   MONOABS 0.2 03/20/2014 0707   EOSABS 0.1 06/26/2024 1850   EOSABS 0.1 03/20/2014 0707   BASOSABS 0.0 06/26/2024 1850   BASOSABS 0.0 03/20/2014 0707   Comprehensive Metabolic Panel:    Component Value Date/Time   NA 143 06/30/2024 0552   NA 143 04/10/2014 1001   K 3.7 06/30/2024 0552   K 3.4 (L) 04/10/2014 1001   CL 108 06/30/2024 0552   CL 108 (H) 04/10/2014 1001   CO2 24 06/30/2024 0552   CO2 31 04/10/2014 1001   BUN 26 (H) 06/30/2024 0552   BUN 14 04/10/2014 1001   CREATININE 1.17 (H) 06/30/2024 0552   CREATININE 0.85 06/16/2024 0832   CREATININE 1.14 04/10/2014 1001   GLUCOSE 346 (H) 06/30/2024 0552   GLUCOSE 107 (H) 04/10/2014 1001   CALCIUM  7.4 (L) 06/30/2024 0552   CALCIUM  8.8 04/10/2014 1001   AST 39 06/26/2024 1850   AST 29 06/16/2024 0832   ALT 25 06/26/2024 1850   ALT 17 06/16/2024 0832   ALT 14 04/10/2014 1001   ALKPHOS 152 (H) 06/26/2024 1850   ALKPHOS 82 04/10/2014 1001   BILITOT 1.5 (H) 06/26/2024 1850   BILITOT 1.3 (H) 06/16/2024 0832   PROT 5.8 (L) 06/26/2024 1850   PROT 7.5 04/10/2014 1001   ALBUMIN  2.3 (L) 06/30/2024 0552   ALBUMIN  3.1 (L) 04/10/2014 1001    RADIOGRAPHIC STUDIES: MR BRAIN WO CONTRAST Result Date: 06/29/2024 EXAM: MRI BRAIN WITHOUT CONTRAST 06/29/2024 02:23:48 PM TECHNIQUE: Multiplanar multisequence MRI of the head/brain was performed without the administration of intravenous contrast. COMPARISON: Head CT 06/28/2023 and MRI 11/07/2021. CLINICAL HISTORY: Acute neuro deficit, stroke suspected. FINDINGS: The examination is mildly motion degraded. BRAIN AND VENTRICLES: There is a large subacute right MCA infarct with scattered petechial hemorrhage. Cytotoxic edema results in sulcal effacement, partial effacement of the right lateral ventricle, and 4 mm of leftward midline shift. Numerous  additional small early subacute infarcts are scattered throughout the left greater than right cerebral hemispheres, right greater than left thalami, and both cerebellar hemispheres. A punctate  focus of chronic blood products is present in the left occipital lobe or possibly in the dependent portion of the occipital horn of the left lateral ventricle. There is mild cerebral atrophy. The distal left vertebral artery is again not well visualized. Other major intracranial vascular flow voids are preserved. No mass, hydrocephalus, or extra-axial fluid collection. ORBITS: Left cataract extraction. SINUSES AND MASTOIDS: Small volume fluid in the right sphenoid sinus. Trace bilateral mastoid fluid. BONES AND SOFT TISSUES: Normal marrow signal. No acute soft tissue abnormality. These results will be called to the ordering clinician or representative by the radiologist assistant, and communication documented in the PACS or [facility] dashboard. IMPRESSION: 1. Large subacute right MCA infarct with petechial hemorrhage and 4 mm leftward midline shift. 2. Numerous small early subacute bilateral cerebral and cerebellar infarcts. Electronically signed by: Dasie Hamburg MD 06/29/2024 03:28 PM EST RP Workstation: [id number]   DG Chest Port 1 View Result Date: 06/28/2024 EXAM: 1 VIEW(S) XRAY OF THE CHEST 06/27/2024 05:34 AM COMPARISON: Portable chest dated 06/27/2024 05:34 AM. CLINICAL HISTORY: Acute respiratory failure with hypoxia (HCC) [id number]. FINDINGS: LINES, TUBES AND DEVICES: ETT tip is 4.7 cm from the carina. Left IJ central line tip in the distal SVC. Right IJ port catheter terminates in the upper right atrium. NG tube is well inside the stomach but the tip is out of view. LUNGS AND PLEURA: The lungs are expiratory. There is increased opacity in the right greater than left lung bases which could be atelectasis or consolidation. Small pleural effusions may also be present. The mid and upper lung fields are clear. No  pneumothorax. HEART AND MEDIASTINUM: Cardiomegaly without evidence of congestive heart failure. Stable mediastinum. BONES AND SOFT TISSUES: Advanced thoracic spondylosis. No new osseous finding. IMPRESSION: 1. Basilar opacities, right greater than left, compatible with atelectasis versus consolidation in the setting of low lung volumes . Small pleural effusions possible. 2. Cardiomegaly without overt congestive failure. Electronically signed by: Francis Quam MD 06/28/2024 05:56 AM EST RP Workstation: [id number]   CT ABDOMEN PELVIS WO CONTRAST Result Date: 06/27/2024 EXAM: CT ABDOMEN AND PELVIS WITHOUT CONTRAST 06/27/2024 09:07:01 PM TECHNIQUE: CT of the abdomen and pelvis was performed without the administration of intravenous contrast. Multiplanar reformatted images are provided for review. Automated exposure control, iterative reconstruction, and/or weight-based adjustment of the mA/kV was utilized to reduce the radiation dose to as low as reasonably achievable. COMPARISON: 06/07/2024 CLINICAL HISTORY: Retroperitoneal bleed suspected. Anemia following cardiac catheterization. History of metastatic pancreatic cancer. FINDINGS: LIMITATIONS/ARTIFACTS: Motion degraded images. LOWER CHEST: Complete right lower lobe atelectasis/collapse, without obstructing endobronchial lesion. LIVER: Innumerable hepatic metastases, mildly progressive. Index lesion in segment 4b (image 31) measures 4.0 cm, previously 3.2 cm. GALLBLADDER AND BILE DUCTS: Status post cholecystectomy. No biliary ductal dilatation. SPLEEN: No acute abnormality. PANCREAS: Pancreatic tail mass measures 3.7 x 8.4 cm, previously 4.3 x 7.2 cm, similar. This corresponds to the patient's known pancreatic cancer. ADRENAL GLANDS: No acute abnormality. KIDNEYS, URETERS AND BLADDER: No stones in the kidneys or ureters. No hydronephrosis. No perinephric or periureteral stranding. Bladder decompressed by an indwelling foley catheter. GI AND BOWEL: Enteric tube is  looped in the gastric antrum. There is no bowel obstruction. PERITONEUM AND RETROPERITONEUM: No ascites. No free air. No evidence of abdominal wall and retroperitoneal hematoma. VASCULATURE: Aorta is normal in caliber. LYMPH NODES: No lymphadenopathy. REPRODUCTIVE ORGANS: Status post hysterectomy. BONES AND SOFT TISSUES: Degenerative changes of the visualized thoracolumbar spine. Moderate degenerative changes of the bilateral hips. Mild stranding / hemorrhage in the  right inguinal region (image 86) extending to the medial right upper thigh (image 96), likely postprocedural. IMPRESSION: 1. No evidence of abdominal wall or retroperitoneal bleed. 2. Mild stranding/hemorrhage in the right inguinal region and medial right upper thigh, likely postprocedural. 3. Grossly stable pancreatic tail mass, corresponding to the patient's known pancreatic cancer. 4. Innumerable hepatic metastases, progressive. 5. Complete right lower lobe atelectasis/collapse, without obstructing endobronchial lesion, likely reflecting mucous plugging. Electronically signed by: Pinkie Pebbles MD 06/27/2024 09:20 PM EST RP Workstation: [id number]   ECHOCARDIOGRAM COMPLETE Result Date: 06/27/2024    ECHOCARDIOGRAM REPORT   Patient Name:   Adaley Perlstein Date of Exam: 06/27/2024 Medical Rec #:  [id number]          Height: Accession #:    [id number]         Weight: Date of Birth:  Jun 29, 1961          BSA: Patient Age:    63 years           BP:           91/54 mmHg Patient Gender: F                  HR:           101 bpm. Exam Location:  [facility] Procedure: 2D Echo, Cardiac Doppler, Color Doppler and Intracardiac            Opacification Agent (Both Spectral and Color Flow Doppler were            utilized during procedure). Indications:     Acute myocardial infarction  History:         Patient has prior history of Echocardiogram examinations, most                  recent 12/28/2023. COPD; Risk Factors:Hypertension, Diabetes and                   Dyslipidemia.  Sonographer:     Philomena Daring Referring Phys:  [id number] DWAYNE D CALLWOOD Diagnosing Phys: Deatrice Cage MD  Sonographer Comments: Patient is intubated. IMPRESSIONS  1. Left ventricular ejection fraction, by estimation, is 45 to 50%. The left ventricle has mildly decreased function. Left ventricular endocardial border not optimally defined to evaluate regional wall motion. There is moderate left ventricular hypertrophy. Left ventricular diastolic parameters are indeterminate.  2. Right ventricular systolic function is normal. The right ventricular size is normal. Tricuspid regurgitation signal is inadequate for assessing PA pressure.  3. The mitral valve is normal in structure. No evidence of mitral valve regurgitation. No evidence of mitral stenosis.  4. The aortic valve is normal in structure. Aortic valve regurgitation is not visualized. No aortic stenosis is present.  5. The inferior vena cava is dilated in size with >50% respiratory variability, suggesting right atrial pressure of 8 mmHg.  6. Challenging image quality. FINDINGS  Left Ventricle: Left ventricular ejection fraction, by estimation, is 45 to 50%. The left ventricle has mildly decreased function. Left ventricular endocardial border not optimally defined to evaluate regional wall motion. Definity  contrast agent was given IV to delineate the left ventricular endocardial Martena Emanuele. The left ventricular internal cavity size was normal in size. There is moderate left ventricular hypertrophy. Left ventricular diastolic parameters are indeterminate. Right Ventricle: The right ventricular size is normal. No increase in right ventricular wall thickness. Right ventricular systolic function is normal. Tricuspid regurgitation signal is inadequate for assessing PA pressure. Left Atrium: Left atrial size was normal in size.  Right Atrium: Right atrial size was normal in size. Pericardium: Trivial pericardial effusion is present. Mitral Valve: The mitral  valve is normal in structure. No evidence of mitral valve regurgitation. No evidence of mitral valve stenosis. Tricuspid Valve: The tricuspid valve is normal in structure. Tricuspid valve regurgitation is not demonstrated. No evidence of tricuspid stenosis. Aortic Valve: The aortic valve is normal in structure. Aortic valve regurgitation is not visualized. No aortic stenosis is present. Pulmonic Valve: The pulmonic valve was normal in structure. Pulmonic valve regurgitation is not visualized. No evidence of pulmonic stenosis. Aorta: The aortic root is normal in size and structure. Venous: The inferior vena cava is dilated in size with greater than 50% respiratory variability, suggesting right atrial pressure of 8 mmHg. IAS/Shunts: No atrial level shunt detected by color flow Doppler.  LEFT VENTRICLE PLAX 2D LVIDd:         4.60 cm   Diastology LVIDs:         3.20 cm   LV e' medial:  4.46 cm/s LV PW:         1.30 cm   LV e' lateral: 7.94 cm/s LV IVS:        1.50 cm LVOT diam:     2.20 cm LV SV:         48 LVOT Area:     3.80 cm  RIGHT VENTRICLE            IVC RV S prime:     6.64 cm/s  IVC diam: 2.00 cm TAPSE (M-mode): 1.6 cm LEFT ATRIUM             RIGHT ATRIUM LA diam:        3.00 cm RA Area:     14.50 cm LA Vol (A2C):   22.0 ml RA Volume:   28.40 ml LA Vol (A4C):   32.3 ml LA Biplane Vol: 26.5 ml  AORTIC VALVE LVOT Vmax:   105.00 cm/s LVOT Vmean:  63.900 cm/s LVOT VTI:    0.125 m  AORTA Ao Root diam: 3.10 cm Ao Asc diam:  3.40 cm  SHUNTS Systemic VTI:  0.12 m Systemic Diam: 2.20 cm Deatrice Cage MD Electronically signed by Deatrice Cage MD Signature Date/Time: 06/27/2024/3:23:10 PM    Final    DG Abd 1 View Result Date: 06/27/2024 CLINICAL DATA:  NG tube placement. EXAM: ABDOMEN - 1 VIEW COMPARISON:  12/21/2014 FINDINGS: NG tube tip is in the mid stomach with proximal side port in the region of the GE junction. NG tube could be advanced 4-5 cm to place the side port below the GE junction as clinically  warranted. Visualized abdomen demonstrates nonspecific bowel gas pattern. IMPRESSION: NG tube tip is in the mid stomach with proximal side port in the region of the GE junction. NG tube could be advanced 4-5 cm to place the side port below the GE junction as clinically warranted. Electronically Signed   By: Camellia Candle M.D.   On: 06/27/2024 05:57   DG Chest Port 1 View Result Date: 06/27/2024 CLINICAL DATA:  Status post intubation. EXAM: PORTABLE CHEST 1 VIEW COMPARISON:  06/27/2024, earlier same day FINDINGS: 0534 hours. Endotracheal tube tip is proximally 3.3 cm above the base of the carina. The NG tube passes into the stomach although the distal tip position is not included on the film. Right Port-A-Cath tip overlies the upper right atrium. Left IJ central line tip overlies the proximal SVC level. Lung volumes are low. Basilar atelectasis/infiltrate evident, left  greater than right with tiny left pleural effusion. The cardio pericardial silhouette is enlarged. Telemetry leads overlie the chest. IMPRESSION: 1. Endotracheal tube tip is 3.3 cm above the base of the carina. 2. Low volume film with bibasilar atelectasis/infiltrate and tiny left pleural effusion. Electronically Signed   By: Camellia Candle M.D.   On: 06/27/2024 05:56   DG Chest Port 1 View Result Date: 06/27/2024 CLINICAL DATA:  Status post left jugular central line placement EXAM: PORTABLE CHEST 1 VIEW COMPARISON:  Film from the previous day. FINDINGS: Cardiac shadow is stable. Right chest wall port is again seen in satisfactory position. New left jugular central line is noted at the cavoatrial junction. No pneumothorax is seen. No focal infiltrate is seen. IMPRESSION: No pneumothorax following central line placement. Electronically Signed   By: Oneil Devonshire M.D.   On: 06/27/2024 03:45   DG Chest Port 1 View Result Date: 06/26/2024 EXAM: 1 VIEW(S) XRAY OF THE CHEST 06/26/2024 07:04:08 PM COMPARISON: 05/12/2024 CLINICAL HISTORY: Dyspnea  FINDINGS: LINES, TUBES AND DEVICES: Stable right chest port. LUNGS AND PLEURA: Low lung volumes. Small left pleural effusion. No focal pulmonary opacity. No pulmonary edema. No pneumothorax. HEART AND MEDIASTINUM: No acute abnormality of the cardiac and mediastinal silhouettes. BONES AND SOFT TISSUES: No acute osseous abnormality. IMPRESSION: 1. Small left pleural effusion. Electronically signed by: Norman Gatlin MD 06/26/2024 07:20 PM EST RP Workstation: [id number]   CARDIAC CATHETERIZATION Result Date: 06/26/2024   Mid RCA to Dist RCA lesion is 100% stenosed.   A drug-eluting stent was successfully placed using a STENT ONYX FRONTIER 3.0X15.   A drug-eluting stent was successfully placed using a STENT ONYX FRONTIER 3.0X12.   Post intervention, there is a 0% residual stenosis.   In the absence of any other complications or medical issues, we expect the patient to be ready for discharge from an interventional cardiology perspective.   Recommend uninterrupted dual antiplatelet therapy with Aspirin  81mg  daily and Clopidogrel 75mg  daily for a minimum of 12 months (ACS-Class I recommendation). Conclusion -Inferior STEMI 100% occlusion distal RCA TIMI 0 flow heavy thrombus Left coronary system no significant disease Intervention Successful PCI and stent to distal RCA with overlapping 3.0 x 15 + 3.0 x 12 frontier Onyx to 15 atm Lesion reduced from TIMI 0 to TIMI 2.5 sluggish flow but much improved after thrombectomy with penumbra and stent placement We elected not to continue Aggrastat or Angiomax because of concern for groin hematoma Patient originally was given prasugrel loading dose but will transition to Plavix tomorrow so she can continue Plavix and Eliquis  which we did not know at the time that she was taken We will discontinue aspirin  prior to discharge Continue dual anticoagulant therapy for 12 months then consider discontinuing Plavix and continuing Eliquis  monotherapy Patient had significant blood loss from  large right groin hematoma patient moved continuously during the case fairly obese difficult body habitus could not lie flat.  Patient also had some bleeding from her right radial side when she accidentally dislodged the sheath from her right wrist and there was significant bleeding We typed and crossed her for 2 units but did not give the order for transfusion we will going to wait till she is in the unit and reevaluate her hemoglobin level before transfusion FemoStop left in place for right groin hemostasis Patient had significant improvement in symptoms prior transferred to the unit but still had some overall confusion and agitation throughout the case Case discussed with hospitalist Will transfer cardiology care in the  morning to [facility]   CT ABDOMEN PELVIS W CONTRAST Result Date: 06/09/2024 CLINICAL DATA:  Follow-up of malignant neoplasm of pancreas. * Tracking Code: BO * EXAM: CT ABDOMEN AND PELVIS WITH CONTRAST TECHNIQUE: Multidetector CT imaging of the abdomen and pelvis was performed using the standard protocol following bolus administration of intravenous contrast. RADIATION DOSE REDUCTION: This exam was performed according to the departmental dose-optimization program which includes automated exposure control, adjustment of the mA and/or kV according to patient size and/or use of iterative reconstruction technique. CONTRAST:  OMNIPAQUE  IOHEXOL  300 MG/ML  SOLN COMPARISON:  12/25/2023 FINDINGS: Lower chest: Left base scarring. The left lower lobe cavitary nodule has resolved. Mild cardiomegaly, without pericardial or pleural effusion. Hepatobiliary: Bilateral hepatic metastasis. These are progressive. Example mass straddling segments 2 and 4A measures 3.2 x 3.0 cm on 24/2 versus 2.2 x 1.8 cm on the prior exam (when remeasured). Inferior right hepatic lobe subcapsular lesion measures 2.6 x 2.2 cm on 35/2 versus 1.3 x 1.5 cm on the prior exam (when remeasured). Cholecystectomy, without biliary ductal  dilatation. Pancreas: Infiltrative pancreatic body and tail mass measures 7.2 x 4.3 cm on image 26/2 versus 7.5 x 4.2 cm on the prior, suggesting stability. Mild peripancreatic edema is similar. Spleen: Subcapsular hypoattenuating splenic lesions are increased. Example lesion medially measures 3.0 cm on 10/02 and is new. A more rounded anterior splenic lesion measures 2.2 cm on 10/02 versus 2.4 cm on the prior exam (when remeasured). Adrenals/Urinary Tract: Normal adrenal glands. Normal kidneys, without hydronephrosis. Normal urinary bladder. Stomach/Bowel: Normal stomach, without wall thickening. Moderate stool within the rectum. Normal small bowel. Vascular/Lymphatic: Aortic atherosclerosis. 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 Patent portal vein and SMV. No abdominopelvic adenopathy. Reproductive: Hysterectomy.  No adnexal mass. Other: No free pelvic fluid. Trace perihepatic ascites is new. No evidence of omental or peritoneal disease. Musculoskeletal: Advanced degenerative changes of both hips and sacroiliac joints. Lumbosacral spondylosis. IMPRESSION: 1. Similar size of infiltrative pancreatic body/tail primary. Similar nonspecific edema in the anterior pararenal space. Cannot exclude superimposed pancreatitis. 2. Moderate progression of hepatic metastasis. 3. New hypoattenuating pancreatic lesions. Given subcapsular position and splenic vein involvement, infarcts are favored. Cannot exclude concurrent small volume metastasis. 4. Resolved left lower lobe cavitary nodule. 5. New trace perihepatic ascites. 6. Rectal stool volume suggests constipation. 7.  Aortic Atherosclerosis (ICD10-I70.0). Electronically Signed   By: Rockey Kilts M.D.   On: 06/09/2024 13:17    PERFORMANCE STATUS (ECOG) : 4 - Bedbound  Review of Systems Unable to complete  Physical Exam General: Ill-appearing Pulmonary: Intubated, synchronous on vent Extremities: no edema, no joint  deformities Skin: no rashes Neurological: Unresponsive  IMPRESSION: Patient with stage IV pancreatic cancer on dose reduced 5-FU and liposomal irinotecan.  Now hospitalized with STEMI status post PCI and stenting.  Hospitalization complicated by respiratory failure requiring intubation.  MRI of the brain yesterday revealed a large subacute right MCA infarct and numerous small early subacute bilateral cerebral and cerebellar infarcts.  Patient remains on vent/pressors.    I called and spoke with patient's daughter.  She has discussed with [facility] team and confirms plan for probable transition to comfort care and one-way extubation tomorrow.  Anticipate in-hospital death.  Of note, patient had on multiple occasions communicated desire to have her body donated to a university.  Daughter is calling [employer] as she thinks that is where patient had completed paperwork.  PLAN: - Continue current scope of treatment- - Family considering possible comfort care/extubation tomorrow  Time Total: 30 minutes  Visit consisted of counseling and education dealing with the complex and emotionally intense issues of symptom management and palliative care in the setting of serious and potentially life-threatening illness.Greater than 50%  of this time was spent counseling and coordinating care related to the above assessment and plan.  Signed by: Fonda Mower, PhD, NP-C

## 2024-06-30 NOTE — Plan of Care (Signed)
  Problem: Education: Goal: Knowledge of General Education information will improve Description: Including pain rating scale, medication(s)/side effects and non-pharmacologic comfort measures Outcome: Not Progressing   Problem: Health Behavior/Discharge Planning: Goal: Ability to manage health-related needs will improve Outcome: Not Progressing   Problem: Clinical Measurements: Goal: Ability to maintain clinical measurements within normal limits will improve Outcome: Not Progressing Goal: Will remain free from infection Outcome: Not Progressing Goal: Diagnostic test results will improve Outcome: Not Progressing Goal: Respiratory complications will improve Outcome: Not Progressing Goal: Cardiovascular complication will be avoided Outcome: Not Progressing   Problem: Activity: Goal: Risk for activity intolerance will decrease Outcome: Not Progressing   Problem: Nutrition: Goal: Adequate nutrition will be maintained Outcome: Not Progressing   Problem: Coping: Goal: Level of anxiety will decrease Outcome: Not Progressing   Problem: Elimination: Goal: Will not experience complications related to bowel motility Outcome: Not Progressing Goal: Will not experience complications related to urinary retention Outcome: Not Progressing   Problem: Pain Managment: Goal: General experience of comfort will improve and/or be controlled Outcome: Not Progressing   Problem: Safety: Goal: Ability to remain free from injury will improve Outcome: Not Progressing

## 2024-07-01 LAB — MAGNESIUM: Magnesium: 2.3 mg/dL (ref 1.7–2.4)

## 2024-07-01 LAB — CBC
HCT: 25 % — ABNORMAL LOW (ref 36.0–46.0)
Hemoglobin: 8.4 g/dL — ABNORMAL LOW (ref 12.0–15.0)
MCH: 30.8 pg (ref 26.0–34.0)
MCHC: 33.6 g/dL (ref 30.0–36.0)
MCV: 91.6 fL (ref 80.0–100.0)
Platelets: 90 K/uL — ABNORMAL LOW (ref 150–400)
RBC: 2.73 MIL/uL — ABNORMAL LOW (ref 3.87–5.11)
RDW: 16.9 % — ABNORMAL HIGH (ref 11.5–15.5)
WBC: 15 K/uL — ABNORMAL HIGH (ref 4.0–10.5)
nRBC: 2.5 % — ABNORMAL HIGH (ref 0.0–0.2)

## 2024-07-01 LAB — CULTURE, BLOOD (ROUTINE X 2)
Culture: NO GROWTH
Culture: NO GROWTH
Special Requests: ADEQUATE

## 2024-07-01 LAB — RENAL FUNCTION PANEL
Albumin: 2.3 g/dL — ABNORMAL LOW (ref 3.5–5.0)
Anion gap: 9 (ref 5–15)
BUN: 28 mg/dL — ABNORMAL HIGH (ref 8–23)
CO2: 26 mmol/L (ref 22–32)
Calcium: 7.5 mg/dL — ABNORMAL LOW (ref 8.9–10.3)
Chloride: 109 mmol/L (ref 98–111)
Creatinine, Ser: 1.09 mg/dL — ABNORMAL HIGH (ref 0.44–1.00)
GFR, Estimated: 56 mL/min — ABNORMAL LOW (ref >60)
Glucose, Bld: 274 mg/dL — ABNORMAL HIGH (ref 70–99)
Phosphorus: 2.7 mg/dL (ref 2.5–4.6)
Potassium: 3.7 mmol/L (ref 3.5–5.1)
Sodium: 145 mmol/L (ref 135–145)

## 2024-07-01 LAB — GLUCOSE, CAPILLARY
Glucose-Capillary: 175 mg/dL — ABNORMAL HIGH (ref 70–99)
Glucose-Capillary: 235 mg/dL — ABNORMAL HIGH (ref 70–99)
Glucose-Capillary: 240 mg/dL — ABNORMAL HIGH (ref 70–99)
Glucose-Capillary: 270 mg/dL — ABNORMAL HIGH (ref 70–99)
Glucose-Capillary: 50 mg/dL — ABNORMAL LOW (ref 70–99)
Glucose-Capillary: <10 mg/dL — CL (ref 70–99)

## 2024-07-01 LAB — CULTURE, RESPIRATORY W GRAM STAIN

## 2024-07-01 MED ORDER — GLYCOPYRROLATE 0.2 MG/ML IJ SOLN
0.2000 mg | INTRAMUSCULAR | Status: DC | PRN
  Administered 2024-07-01: 0.2 mg via INTRAVENOUS
  Filled 2024-07-01: qty 1

## 2024-07-01 MED ORDER — HYDROMORPHONE BOLUS VIA INFUSION
1.0000 mg | INTRAVENOUS | Status: DC | PRN
  Administered 2024-07-01 – 2024-07-02 (×4): 1 mg via INTRAVENOUS

## 2024-07-01 MED ORDER — GLYCOPYRROLATE 1 MG PO TABS: 1.0000 mg | ORAL_TABLET | ORAL | Status: DC | PRN

## 2024-07-01 MED ORDER — ORAL CARE MOUTH RINSE
15.0000 mL | OROMUCOSAL | Status: DC
  Administered 2024-07-01 (×6): 15 mL via OROMUCOSAL

## 2024-07-01 MED ORDER — GLYCOPYRROLATE 0.2 MG/ML IJ SOLN
0.2000 mg | INTRAMUSCULAR | Status: DC | PRN
Start: 2024-07-01 — End: 2024-07-03

## 2024-07-01 MED ORDER — HYDROMORPHONE HCL-NACL 50-0.9 MG/50ML-% IV SOLN
0.0000 mg/h | INTRAVENOUS | Status: DC
  Administered 2024-07-01: 2.5 mg/h via INTRAVENOUS
  Administered 2024-07-01: 1 mg/h via INTRAVENOUS
  Administered 2024-07-01: 2 mg/h via INTRAVENOUS
  Administered 2024-07-02: 2.5 mg/h via INTRAVENOUS
  Administered 2024-07-02: 2 mg/h via INTRAVENOUS
  Filled 2024-07-01 (×3): qty 50

## 2024-07-01 MED ORDER — MIDAZOLAM HCL (PF) 2 MG/2ML IJ SOLN
2.0000 mg | INTRAMUSCULAR | Status: DC | PRN
  Administered 2024-07-01: 2 mg via INTRAVENOUS
  Filled 2024-07-01: qty 2

## 2024-07-01 MED ORDER — POLYVINYL ALCOHOL 1.4 % OP SOLN: 1.0000 [drp] | Freq: Four times a day (QID) | OPHTHALMIC | Status: DC | PRN

## 2024-07-01 MED ORDER — ORAL CARE MOUTH RINSE: 15.0000 mL | OROMUCOSAL | Status: DC | PRN

## 2024-07-01 NOTE — Plan of Care (Signed)
 Attended CCM multi-disciplinary rounds. CCM team shared plan today for compassionate extubation with transition to full comfort measures. CCM team to place orders once family ready to proceed. Per CCM, no acute ongoing palliative care needs at this time. Encouraged CCM team and nursing staff to reach for needs and/or support.  No Charge.  Waddell Lesches, DNP, AGNP-C Palliative Medicine  Please call Palliative Medicine team phone with any questions [phone]. For individual providers please see AMION.

## 2024-07-01 NOTE — Progress Notes (Signed)
   07/01/24 1745  Spiritual Encounters  Type of Visit Initial  Care provided to: Patient;Family;Friend  Conversation partners present during encounter Nurse  Referral source Code page  Reason for visit End-of-life  OnCall Visit Yes  Spiritual Framework  Presenting Themes Significant life change;Rituals and practive  Interventions  Spiritual Care Interventions Made Compassionate presence;Decision-making support/facilitation;Bereavement/grief support;Prayer   Provided compassionate presence and conversation for daughter and friends.  Daughter requested prayer during transition of life.

## 2024-07-01 NOTE — Plan of Care (Signed)
   Problem: Education: Goal: Knowledge of General Education information will improve Description: Including pain rating scale, medication(s)/side effects and non-pharmacologic comfort measures Outcome: Not Progressing   Problem: Health Behavior/Discharge Planning: Goal: Ability to manage health-related needs will improve Outcome: Not Progressing   Problem: Clinical Measurements: Goal: Ability to maintain clinical measurements within normal limits will improve Outcome: Not Progressing Goal: Will remain free from infection Outcome: Not Progressing Goal: Diagnostic test results will improve Outcome: Not Progressing Goal: Respiratory complications will improve Outcome: Not Progressing   Problem: Activity: Goal: Risk for activity intolerance will decrease Outcome: Not Progressing

## 2024-07-01 NOTE — Plan of Care (Signed)
  Problem: Clinical Measurements: Goal: Ability to maintain clinical measurements within normal limits will improve Outcome: Progressing Goal: Will remain free from infection Outcome: Progressing Goal: Diagnostic test results will improve Outcome: Progressing Goal: Respiratory complications will improve Outcome: Progressing Goal: Cardiovascular complication will be avoided Outcome: Progressing   Problem: Activity: Goal: Risk for activity intolerance will decrease Outcome: Progressing   Problem: Elimination: Goal: Will not experience complications related to bowel motility Outcome: Progressing Goal: Will not experience complications related to urinary retention Outcome: Progressing   Problem: Safety: Goal: Ability to remain free from injury will improve Outcome: Progressing

## 2024-07-01 NOTE — Progress Notes (Signed)
 NAME:  Kristina Cruz, MRN:  [id number], DOB:  08-31-1960, LOS: 5 ADMISSION DATE:  06/26/2024, CONSULTATION DATE:  06/27/2024 REFERRING MD:  Dr. Manfred, CHIEF COMPLAINT:  Hypotension   Brief Pt Description / Synopsis:  63 y.o. female with PMHx significant for Stage IV Pancreatic cancer undergoing chemotherapy who is admitted with inferior STEMI requiring PCI and DES to the distal RCA.  Course complicated by hemorrhagic shock due to bleeding from right femoral/radial cath puncture sites, metabolic acidosis, acute hypoxic respiratory failure, and acute metabolic encephalopathy requiring intubation and mechanical ventilation. Also with large right sided MCA stroke.  History of Present Illness:  Kristina Cruz is a 63 y.o. female with a  past medical history of paroxysmal atrial fibrillation on apixaban  status post cardioversion, stage IV pancreatic cancer with mets to the liver on active chemo, type 2 diabetes, morbid obesity, COPD, asthma, hypertension, GERD, RLS, and chronic anemia who presented to [facility] ED on 06/26/24 with acute chest discomfort.  She reported the chest pain started about an hour prior to presentation (rated as 11/10 and mid sternal) and felt like an with associated shortness of breath and diaphoresis.  In the ED she was found to have an inferior STEMI with reciprocal changes.  She was given Heparin , nitroglycerin, and aspirin .  Cardiology was consulted and she was take for emergent cardiac.  She was found to have occlusion of the distal RCA requiring PCI and DES to the distal RCA.  Post cath she was noted to have bleeding for her right radial wrist site and with a right groin hematoma.  TRH asked to admit for further workup and treatment, however after arrival to ICU following cath, she was noted to be hypotensive.  PCCM was consulted.   Please see Significant Hospital Events section below for full detailed hospital course.   Pertinent  Medical History   Past Medical  History:  Diagnosis Date   Anemia    Asthma    Back pain    Cataract    Diabetes mellitus without complication (HCC)    GERD (gastroesophageal reflux disease)    History of echocardiogram    Hypertension    Morbid obesity with BMI of 60.0-69.9, adult (HCC)    Restless leg syndrome     Micro Data:  11/9: MRSA PCR>> negative 11/9: Blood cultures x2>> no growth  11/10: Tracheal aspirate>> Staph aureus & Klebsiella Pneumonia   Antimicrobials:   Anti-infectives (From admission, onward)    Start     Dose/Rate Route Frequency Ordered Stop   06/27/24 1645  piperacillin-tazobactam (ZOSYN) IVPB 3.375 g        3.375 g 12.5 mL/hr over 240 Minutes Intravenous Every 8 hours 06/27/24 1554          Significant Hospital Events: Including procedures, antibiotic start and stop dates in addition to other pertinent events   11/9: Presented to ED with chest pain, found to have inferior ST elevation on EKG, Cardiac consulted and emergency cath performed with PCI and stent to distal RCA.  Post cath developed right groin hematoma and hypotension concerning for possible hemorrhagic shock.  Was placed on vasopressors, PCCM consulted. 10/10: Earlier this morning became obtunded requiring intubation.  Remains critically ill, requiring Levophed.  Massive transfusion protocol initiated (received 4 pRBC's, 2 FFP, 2 Cryo).  CT Abdomen & Pelvis without evidence of retroperitoneal bleed, did show mild stranding/hemorrhage in right inguinal region and medial right upper thigh. 11/11: No significant events overnight.  Hgb stable overnight, no additional blood products  required.  Weaning Levophed (down to 16 mcg currently). Fever overnight (T Max 101.6), Tracheal aspirate with gram + cocci, will continue Zosyn for now.  On minimal vent support, plan for WUA/SBT later today as tolerated.  11/12: No significant overnight events. Slight decline in Hgb at 9.2. No longer requiring levophed. Tmax overnight 100.8, remains  on Zosyn. Weaned off precedex. On minimal vent support, placed in SBT and tolerated fairly well. Noted to have left sided deficits, found to have Large right sided MCA Stroke with midline shift on MRI Brain. 11/13: Restarted levophed and precedex last night. No acute changes. Plan for comfort care. 11/14: No significant events noted overnight.  Tracheal aspirate resulted with Staph Aureus and Klebsiella. Tentative plan for transition to comfort care today once family has visited.   Interim History / Subjective:  As outlined above under Significant Hospital Events section  Objective   Blood pressure 99/60, pulse 62, temperature 99.1 F (37.3 C), resp. rate (!) 22, height 5' 1 (1.549 m), weight 116.6 kg, last menstrual period 01/04/2018, SpO2 96%. CVP:  [3 mmHg-13 mmHg] 4 mmHg  Vent Mode: PRVC FiO2 (%):  [30 %] 30 % Set Rate:  [20 bmp] 20 bmp Vt Set:  [450 mL] 450 mL PEEP:  [5 cmH20] 5 cmH20 Plateau Pressure:  [20 cmH20-21 cmH20] 20 cmH20   Intake/Output Summary (Last 24 hours) at 07/01/2024 9095 Last data filed at 07/01/2024 0800 Gross per 24 hour  Intake 2066 ml  Output 1195 ml  Net 871 ml   Filed Weights   06/28/24 0315 06/29/24 0330 07/01/24 0720  Weight: 115 kg 112.5 kg 116.6 kg    Examination: General: Critically ill-appearing female, laying in bed, intubated and sedated, no acute distress HENT: Atraumatic, normocephalic, neck supple, difficult to assess JVD due to body habitus, orally intubated Lungs: Mechanical breath sounds throughout, even, nonlabored, occasionally overbreathing the vent Cardiovascular: Regular rate and rhythm, S1-S2, no murmurs, rubs, gallops Abdomen: Abdominal exam is limited due to intubation sedation: Obese, soft, nontender, nondistended, no guarding or tenderness, bowel sounds plus x 4 Extremities: Normal bulk and tone, no deformities Neuro: Sedated, withdraws from painful stimuli only on right side, currently does not follow commands, PERRLA GU:  Foley catheter in place draining yellow urine  Resolved Hospital Problem list     Assessment & Plan:   #Shock: Hemorrhagic +/- Septic ~ IMPROVING  #Inferior STEMI #Paroxsymal Atrial Fibrillation PMHx: HTN -Echocardiogram 06/27/24: LVEF 45-50%, indeterminate diastolic parameters, RV systolic function is normal, RV size is normal -Continuous cardiac monitoring -Maintain MAP >65 -Cautious IV fluids -Transfusions as indicated -Vasopressors as needed to maintain MAP goal -Trend lactic acid until normalized -Trend HS Troponin until peaked -Diuresis as BP and renal function permits ~ holding due to shock  -Cardiology following, appreciate input ~ Continue ASA and Prasugrel  -Unable to anticoagulate for now due to hemorrhagic shock   #Acute Hypoxic Respiratory Failure #Staph Aureus & Klebsiella Pneumonia  PMHx: COPD, asthma  -Full vent support, implement lung protective strategies -Plateau pressures less than 30 cm H20 -Wean FiO2 & PEEP as tolerated to maintain O2 sats >92% -Follow intermittent Chest X-ray & ABG as needed -Spontaneous Breathing Trials when respiratory parameters met and mental status permits -Implement VAP Bundle -Prn Bronchodilators -ABX as above   #Acute Blood Loss Anemia #Thrombocytopenia -CT Abdomen & Pelvis 11/10:  no evidence of abdominal wall or retroperitoneal bleed; mild stranding/hemorrhage in the right inguinal region and medial right upper thigh (likely postprocedural) -Monitor for S/Sx of bleeding -  Trend CBC -SCD's for VTE Prophylaxis  -Transfuse for Hgb <8 -Transfuse Platelets for Platelet count < 10K; < 50K with active bleeding; < 100K with Neurosurgical procedures/processes  -Check DIC panel ~ Smear negative for schistocytes  #Staph Aureus & Klebsiella Pneumonia -Monitor fever curve -Trend WBC's & Procalcitonin -Follow cultures as above -Continue empiric Zosyn pending cultures & sensitivities   #AKI ~ IMPROVED  #Anion Gap Metabolic Acidosis  due to Lactic Acidosis ~ RESOLVED #Hypokalemia ~ RESOLVED  -Monitor I&O's / urinary output -Follow BMP -Ensure adequate renal perfusion -Avoid nephrotoxic agents as able -Replace electrolytes as indicated ~ Pharmacy following for assistance with electrolyte replacement  #Diabetes Mellitus  -CBG's q4h; Target range of 140 to 180 -SSI -Follow ICU Hypo/Hyperglycemia protocol  #Large Right MCA CVA with midline shift  #Acute Metabolic Encephalopathy #Sedation needs in setting of mechanical ventilation  11/12 Brain MRI: Large subacute right MCA infarct with petechial hemorrhage and 4 mm leftward midline shift. Numerous small early subacute bilateral cerebral and cerebellar infarcts.  -Maintain a RASS goal of 0 to -1 -Precedex to maintain RASS goal -Avoid sedating medications as able -Daily wake up assessment -Plan to transition to comfort measures later today 11/14   Given stage IV pancreatic cancer, RCA stemi, stroke, respiratory failure, overall with very poor prognosis, daughter decided to proceed with comfort care measures, awaiting family members to present to the bedside prior to proceeding.    Best Practice (right click and Reselect all SmartList Selections daily)   Diet/type: NPO, tube feeds  DVT prophylaxis: SCD GI prophylaxis: PPI Lines: Central line and yes and it is still needed, arterial line still needed Foley:  Yes, and it is still needed Code Status:  full code Last date of multidisciplinary goals of care discussion [11/14]  11/14: Will update pt's daughter when she arrives on plan of care.  Labs   CBC: Recent Labs  Lab 06/26/24 1510 06/26/24 1850 06/27/24 0320 06/27/24 1600 06/27/24 1921 06/28/24 0404 06/28/24 1035 06/29/24 0410 06/30/24 0552 07/01/24 0404  WBC 9.4 12.7*   < > 21.0*  --  17.2*  --  15.8* 15.1* 15.0*  NEUTROABS 7.6 10.5*  --   --   --   --   --   --   --   --   HGB 12.0 8.2*   < > 9.8*   < > 10.3* 10.0* 9.2* 8.0* 8.4*  HCT 36.3 25.1*    < > 26.7*   < > 28.1* 28.2* 26.0* 23.5* 25.0*  MCV 94.5 96.2   < > 86.1  --  83.6  --  87.0 89.4 91.6  PLT 133* 171   < > 82*  --  49*  --  49* 63* 90*   < > = values in this interval not displayed.    Basic Metabolic Panel: Recent Labs  Lab 06/26/24 1850 06/26/24 2302 06/27/24 1600 06/28/24 0404 06/29/24 0410 06/30/24 0552 07/01/24 0404  NA 132*   < > 139 138 142 143 145  K 3.4*   < > 4.3 4.3 3.8 3.7 3.7  CL 97*   < > 102 103 107 108 109  CO2 19*   < > 23 23 24 24 26   GLUCOSE 541*   < > 38* 200* 209* 346* 274*  BUN 21   < > 26* 26* 23 26* 28*  CREATININE 0.99   < > 1.29* 1.42* 1.30* 1.17* 1.09*  CALCIUM  7.5*   < > 7.6* 7.7* 7.7* 7.4* 7.5*  MG  2.2  --   --  1.8 2.3 2.3 2.3  PHOS  --   --   --  2.4* 2.7 2.9 2.7   < > = values in this interval not displayed.   GFR: Estimated Creatinine Clearance: 61.2 mL/min (A) (by C-G formula based on SCr of 1.09 mg/dL (H)). Recent Labs  Lab 06/27/24 1100 06/27/24 1600 06/27/24 1921 06/27/24 2309 06/28/24 0404 06/29/24 0410 06/30/24 0552 07/01/24 0404  WBC  --  21.0*  --   --  17.2* 15.8* 15.1* 15.0*  LATICACIDVEN 8.8* 4.1* 4.3* 3.7*  --   --   --   --     Liver Function Tests: Recent Labs  Lab 06/26/24 1510 06/26/24 1850 06/28/24 0404 06/29/24 0410 06/30/24 0552 07/01/24 0404  AST 42* 39  --   --   --   --   ALT 32 25  --   --   --   --   ALKPHOS 222* 152*  --   --   --   --   BILITOT 1.7* 1.5*  --   --   --   --   PROT 8.2* 5.8*  --   --   --   --   ALBUMIN  3.5 2.5* 2.3* 2.4* 2.3* 2.3*   No results for input(s): LIPASE, AMYLASE in the last 168 hours. No results for input(s): AMMONIA in the last 168 hours.  ABG    Component Value Date/Time   PHART 7.54 (H) 06/28/2024 0404   PCO2ART 28 (L) 06/28/2024 0404   PO2ART 92 06/28/2024 0404   HCO3 23.9 06/28/2024 0404   ACIDBASEDEF 9.6 (H) 06/27/2024 0629   O2SAT 98.1 06/28/2024 0404     Coagulation Profile: Recent Labs  Lab 06/26/24 1510 06/26/24 1850  06/27/24 0813 06/28/24 0404  INR 1.4* 4.1* 2.0* 1.7*    Cardiac Enzymes: No results for input(s): CKTOTAL, CKMB, CKMBINDEX, TROPONINI in the last 168 hours.  HbA1C: Hgb A1c MFr Bld  Date/Time Value Ref Range Status  06/26/2024 03:10 PM 6.4 (H) 4.8 - 5.6 % Final    Comment:    (NOTE) Diagnosis of Diabetes The following HbA1c ranges recommended by the American Diabetes Association (ADA) may be used as an aid in the diagnosis of diabetes mellitus.  Hemoglobin             Suggested A1C NGSP%              Diagnosis  <5.7                   Non Diabetic  5.7-6.4                Pre-Diabetic  >6.4                   Diabetic  <7.0                   Glycemic control for                       adults with diabetes.    12/25/2023 10:00 PM 10.9 (H) 4.8 - 5.6 % Final    Comment:    (NOTE) Pre diabetes:          5.7%-6.4%  Diabetes:              >6.4%  Glycemic control for   <7.0% adults with diabetes     CBG: Recent Labs  Lab 06/30/24 1534  06/30/24 2026 07/01/24 0009 07/01/24 0402 07/01/24 0802  GLUCAP 227* 225* 235* 240* 270*    Review of Systems:   Unable to assess due to intubation/sedation/critical illness    Past Medical History:  She,  has a past medical history of Anemia, Asthma, Back pain, Cataract, Diabetes mellitus without complication (HCC), GERD (gastroesophageal reflux disease), History of echocardiogram, Hypertension, Morbid obesity with BMI of 60.0-69.9, adult (HCC), and Restless leg syndrome.   Surgical History:   Past Surgical History:  Procedure Laterality Date   ABDOMINAL HYSTERECTOMY     CARDIOVERSION N/A 12/28/2023   Procedure: CARDIOVERSION;  Surgeon: Darliss Rogue, MD;  Location: [facility] ORS;  Service: Cardiovascular;  Laterality: N/A;   CATARACT EXTRACTION W/PHACO Left 04/13/2020   Procedure: CATARACT EXTRACTION PHACO AND INTRAOCULAR LENS PLACEMENT (IOC);  Surgeon: Ferol Rogue, MD;  Location: [facility] ORS;  Service: Ophthalmology;   Laterality: Left;  US  00:36.0 CDE 5.95 Fluid Pack lot # [id number]   CORONARY THROMBECTOMY N/A 06/26/2024   Procedure: Coronary Thrombectomy;  Surgeon: Florencio Cara BIRCH, MD;  Location: [facility];  Service: Cardiovascular;  Laterality: N/A;   CORONARY/GRAFT ACUTE MI REVASCULARIZATION N/A 06/26/2024   Procedure: Coronary/Graft Acute MI Revascularization;  Surgeon: Florencio Cara BIRCH, MD;  Location: [facility];  Service: Cardiovascular;  Laterality: N/A;   HYSTEROSCOPY WITH D & C N/A 07/14/2017   Procedure: DILATATION AND CURETTAGE /HYSTEROSCOPY;  Surgeon: Arloa Lamar SQUIBB, MD;  Location: [facility] OR[facility];  Service: Gynecology;  Laterality: N/A;   IR IMAGING GUIDED PORT INSERTION  12/17/2023   LEFT HEART CATH AND CORONARY ANGIOGRAPHY N/A 06/26/2024   Procedure: LEFT HEART CATH AND CORONARY ANGIOGRAPHY;  Surgeon: Florencio Cara BIRCH, MD;  Location: [facility];  Service: Cardiovascular;  Laterality: N/A;   POLYPECTOMY  2015   TEE WITHOUT CARDIOVERSION N/A 12/28/2023   Procedure: ECHOCARDIOGRAM, TRANSESOPHAGEAL;  Surgeon: Darliss Rogue, MD;  Location: [facility] OR[facility];  Service: Cardiovascular;  Laterality: N/A;     Social History:   reports that she has never smoked. She has never used smokeless tobacco. She reports that she does not drink alcohol and does not use drugs.   Family History:  Her family history includes Breast cancer (age of onset: 2) in her mother; Diabetes in her father and mother; Hypertension in her father and mother; Ovarian cancer in her paternal aunt.   Allergies Allergies  Allergen Reactions   Peanut-Containing Drug Products      Home Medications  Prior to Admission medications   Medication Sig Start Date End Date Taking? Authorizing Provider  acetaminophen  (TYLENOL  8 HOUR ARTHRITIS PAIN) 650 MG CR tablet Take 3 tablets (1,950 mg total) by mouth every 8 (eight) hours as needed for pain. Do not exceed 4000 mg total in a day.  Home med. 04/21/23   Awanda City,  MD  amLODipine  (NORVASC ) 10 MG tablet Take 10 mg by mouth daily. 03/28/24   [provider]  apixaban  (ELIQUIS ) 5 MG TABS tablet Take 1 tablet (5 mg total) by mouth 2 (two) times daily. 05/19/24   Babara Call, MD  azelastine  (ASTELIN ) 0.1 % nasal spray Place 1 spray into both nostrils 2 (two) times daily. 10/22/23   [provider]  baclofen  (LIORESAL ) 10 MG tablet Take 10 mg by mouth 3 (three) times daily.    [provider]  dexamethasone  (DECADRON ) 4 MG tablet Take 1 tablet (4 mg total) by mouth daily. Start the day after irinotecan chemotherapy for 2 days. Take with food. 06/16/24  Babara Call, MD  diphenoxylate -atropine  (LOMOTIL ) 2.5-0.025 MG tablet Take 2 tablets by mouth every 6 (six) hours as needed (for diarrhea unrelieved by imodium). 12/21/23   Dasie Tinnie MATSU, NP  EPINEPHrine  0.3 mg/0.3 mL IJ SOAJ injection Inject 0.3 mg into the muscle as needed for anaphylaxis. 09/19/20   Angelena Smalls, MD  fluticasone  (FLONASE ) 50 MCG/ACT nasal spray Place 1 spray into both nostrils daily. 09/29/23   Patel, Sona, MD  fluticasone  furoate-vilanterol (BREO ELLIPTA ) 100-25 MCG/ACT AEPB Inhale 1 puff into the lungs daily. 09/29/23   Patel, Sona, MD  furosemide  (LASIX ) 40 MG tablet Take 40 mg by mouth daily. 03/23/20   [provider]  gabapentin  (NEURONTIN ) 300 MG capsule Take 1 capsule (300 mg total) by mouth 2 (two) times daily. 04/20/24   Borders, Fonda SAUNDERS, NP  ipratropium-albuterol  (DUONEB) 0.5-2.5 (3) MG/3ML SOLN Take 3 mLs by nebulization every 4 (four) hours as needed (wheezing / shortness of breath). 09/29/23   Patel, Sona, MD  levocetirizine (XYZAL ) 5 MG tablet Take 5 mg by mouth daily. 10/21/23   [provider]  lidocaine -prilocaine  (EMLA ) cream Apply 1 Application topically as needed. Apply to port and cover with saran wrap 1-2 hours prior to port access 01/08/24   Babara Call, MD  loperamide (IMODIUM) 2 MG capsule Take 1 capsule (2 mg total) by mouth See admin instructions.  Initial: 4 mg with onset of diarrhea, then 2 mg every 2 hours, or after each unformed stool. Maximum: 16 mg/day 06/16/24   Yu, Zhou, MD  magic mouthwash (lidocaine , diphenhydrAMINE , alum & mag hydroxide) suspension Swish and swallow 5 mLs 4 (four) times daily as needed for mouth pain. 12/18/23   Dasie Tinnie MATSU, NP  metFORMIN  (GLUCOPHAGE ) 1000 MG tablet Take 1,000 mg by mouth 2 (two) times daily. 11/27/22   [provider]  metoprolol  tartrate (LOPRESSOR ) 25 MG tablet Take 1 tablet (25 mg total) by mouth 2 (two) times daily. 12/30/23 06/16/24  Trudy Anthony HERO, MD  morphine  (MS CONTIN ) 15 MG 12 hr tablet Take 1 tablet (15 mg total) by mouth every 12 (twelve) hours. 05/04/24   Borders, Fonda SAUNDERS, NP  naloxone  (NARCAN ) nasal spray 4 mg/0.1 mL SPRAY 1 SPRAY INTO ONE NOSTRIL AS DIRECTED FOR OPIOID OVERDOSE (TURN PERSON ON SIDE AFTER DOSE. IF NO RESPONSE IN 2-3 MINUTES OR PERSON RESPONDS BUT RELAPSES, REPEAT USING A NEW SPRAY DEVICE AND SPRAY INTO THE OTHER NOSTRIL. CALL 911 AFTER USE.) * EMERGENCY USE ONLY * Patient not taking: Reported on 06/16/2024 11/20/23   Borders, Fonda SAUNDERS, NP  ondansetron  (ZOFRAN -ODT) 8 MG disintegrating tablet Take 1 tablet (8 mg total) by mouth every 8 (eight) hours as needed for nausea or vomiting. Do not take within 72 hours of chemotherapy. 06/16/24   Babara Call, MD  oxyCODONE  (OXY IR/ROXICODONE ) 5 MG immediate release tablet Take 1 tablet (5 mg total) by mouth every 4 (four) hours as needed for moderate pain (pain score 4-6) or severe pain (pain score 7-10). 06/02/24   Babara Call, MD  potassium chloride  (KLOR-CON  M) 10 MEQ tablet Take 1 tablet (10 mEq total) by mouth daily. 05/19/24   Babara Call, MD  prochlorperazine  (COMPAZINE ) 10 MG tablet Take 1 tablet (10 mg total) by mouth every 6 (six) hours as needed for nausea or vomiting. 06/16/24   Babara Call, MD  rOPINIRole  (REQUIP ) 2 MG tablet Take 2 mg by mouth in the morning and at bedtime.     [provider]  spironolactone   (ALDACTONE ) 50  MG tablet Take 1 tablet (50 mg total) by mouth daily. 02/01/24   Amin, Sumayya, MD  traZODone  (DESYREL ) 50 MG tablet Take 0.5-1 tablets (25-50 mg total) by mouth at bedtime as needed for sleep. 01/08/24   Borders, Fonda SAUNDERS, NP  VENTOLIN  HFA 108 (90 Base) MCG/ACT inhaler Inhale 2 puffs into the lungs every 4 (four) hours as needed for wheezing or shortness of breath. 09/29/23   Patel, Sona, MD     Critical care time: 40 minutes      Inge Lecher, AGACNP-BC [facility] Prefer epic messenger for cross cover needs If after hours, please call E-link

## 2024-07-01 NOTE — Progress Notes (Signed)
 Patient extubated per MD comfort measure orders @ 1815.

## 2024-07-02 DIAGNOSIS — I213 ST elevation (STEMI) myocardial infarction of unspecified site: Secondary | ICD-10-CM | POA: Diagnosis not present

## 2024-07-02 DIAGNOSIS — Z515 Encounter for palliative care: Secondary | ICD-10-CM | POA: Diagnosis not present

## 2024-07-04 ENCOUNTER — Inpatient Hospital Stay

## 2024-07-04 ENCOUNTER — Inpatient Hospital Stay: Admitting: Oncology

## 2024-07-06 ENCOUNTER — Inpatient Hospital Stay

## 2024-07-06 ENCOUNTER — Inpatient Hospital Stay: Admitting: Oncology

## 2024-07-08 ENCOUNTER — Inpatient Hospital Stay

## 2024-07-18 ENCOUNTER — Inpatient Hospital Stay: Admitting: Hospice and Palliative Medicine

## 2024-07-18 NOTE — Death Summary Note (Signed)
 DEATH SUMMARY   Patient Details  Name: Kristina Cruz MRN: [id number] DOB: 02-09-1961 ERE:Tnni, [name], FNP Admission/Discharge Information   Admit Date:  Jul 19, 2024  Date of Death:    Time of Death:    Length of Stay: 6   Principle Cause of death: Hypoxemia   Hospital Diagnoses: Principal Problem:   ST elevation myocardial infarction (STEMI) [facility]) Active Problems:   Diabetes mellitus type 2 with complications (HCC)   Acute ST elevation myocardial infarction (STEMI) of inferior wall (HCC)   Hemorrhagic shock (HCC)   Toxic metabolic encephalopathy   Blood loss anemia   Cerebrovascular accident (CVA) due to embolism of precerebral artery (HCC)   Aspiration pneumonia of right lower lobe [facility])   Hospital Course: 63 y.o. female with PMHx significant for Stage IV Pancreatic cancer undergoing chemotherapy who is admitted with inferior STEMI requiring PCI and DES to the distal RCA.  Course complicated by hemorrhagic shock due to bleeding from right femoral/radial cath puncture sites, metabolic acidosis, acute hypoxic respiratory failure, and acute metabolic encephalopathy requiring intubation and mechanical ventilation. Also with large right sided MCA stroke.  Patient was compassionately extubated 06/1411/09/2023: Presented to ED with chest pain, found to have inferior ST elevation on EKG, Cardiac consulted and emergency cath performed with PCI and stent to distal RCA.  Post cath developed right groin hematoma and hypotension concerning for possible hemorrhagic shock.  Was placed on vasopressors, PCCM consulted. 11/10: Earlier this morning became obtunded requiring intubation.  Remains critically ill, requiring Levophed.  Massive transfusion protocol initiated (received 4 pRBC's, 2 FFP, 2 Cryo).  CT Abdomen & Pelvis without evidence of retroperitoneal bleed, did show mild stranding/hemorrhage in right inguinal region and medial right upper thigh. 11/11: No significant events overnight.   Hgb stable overnight, no additional blood products required.  Weaning Levophed (down to 16 mcg currently). Fever overnight (T Max 101.6), Tracheal aspirate with gram + cocci, will continue Zosyn for now.  On minimal vent support, plan for WUA/SBT later today as tolerated.  11/12: No significant overnight events. Slight decline in Hgb at 9.2. No longer requiring levophed. Tmax overnight 100.8, remains on Zosyn. Weaned off precedex. On minimal vent support, placed in SBT and tolerated fairly well. Noted to have left sided deficits, found to have Large right sided MCA Stroke with midline shift on MRI Brain. 11/13: Restarted levophed and precedex last night. No acute changes. Plan for comfort care. 11/14: No significant events noted overnight.  Tracheal aspirate resulted with Staph Aureus and Klebsiella.  Patient compassionately extubated  Expired [id number]  Assessment and Plan: Shock, multifactorial Further monitoring and interventions stopped due to trasnition to comfort care.      Inferior STEMI s/p PCI w/ DES Parxoysmal afib All interventions have been stopped due to transition to comfort care.   Acute hypoxic respiratory failure Staph aureus and Klebsiella pneumonia History of COPD/asthma Patient was compassionately extubated 11/14 and is now comfort care   Acute blood loss anemia Further monitoring stop due to transition to comfort care     AKI  Further monitoring stopped due to transition to comfort care.      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 Patient transition to comfort care.   Type 2 diabetes further monitoring stopped due to transition to comfort       Procedures: PCI with stent   Consultations: Palliative care, cardiology   The results of significant diagnostics from this  hospitalization (including imaging, microbiology, ancillary and laboratory) are listed below for reference.   Significant Diagnostic Studies: MR BRAIN WO  CONTRAST Result Date: 06/29/2024 EXAM: MRI BRAIN WITHOUT CONTRAST 06/29/2024 02:23:48 PM TECHNIQUE: Multiplanar multisequence MRI of the head/brain was performed without the administration of intravenous contrast. COMPARISON: Head CT 06/28/2023 and MRI 11/07/2021. CLINICAL HISTORY: Acute neuro deficit, stroke suspected. FINDINGS: The examination is mildly motion degraded. BRAIN AND VENTRICLES: There is a large subacute right MCA infarct with scattered petechial hemorrhage. Cytotoxic edema results in sulcal effacement, partial effacement of the right lateral ventricle, and 4 mm of leftward midline shift. Numerous additional small early subacute infarcts are scattered throughout the left greater than right cerebral hemispheres, right greater than left thalami, and both cerebellar hemispheres. A punctate focus of chronic blood products is present in the left occipital lobe or possibly in the dependent portion of the occipital horn of the left lateral ventricle. There is mild cerebral atrophy. The distal left vertebral artery is again not well visualized. Other major intracranial vascular flow voids are preserved. No mass, hydrocephalus, or extra-axial fluid collection. ORBITS: Left cataract extraction. SINUSES AND MASTOIDS: Small volume fluid in the right sphenoid sinus. Trace bilateral mastoid fluid. BONES AND SOFT TISSUES: Normal marrow signal. No acute soft tissue abnormality. These results will be called to the ordering clinician or representative by the radiologist assistant, and communication documented in the PACS or [facility]ario dashboard. IMPRESSION: 1. Large subacute right MCA infarct with petechial hemorrhage and 4 mm leftward midline shift. 2. Numerous small early subacute bilateral cerebral and cerebellar infarcts. Electronically signed by: Dasie Hamburg MD 06/29/2024 03:28 PM EST RP Workstation: [id number]   DG Chest Port 1 View Result Date: 06/28/2024 EXAM: 1 VIEW(S) XRAY OF THE CHEST 06/27/2024 05:34 AM  COMPARISON: Portable chest dated 06/27/2024 05:34 AM. CLINICAL HISTORY: Acute respiratory failure with hypoxia (HCC) [id number]. FINDINGS: LINES, TUBES AND DEVICES: ETT tip is 4.7 cm from the carina. Left IJ central line tip in the distal SVC. Right IJ port catheter terminates in the upper right atrium. NG tube is well inside the stomach but the tip is out of view. LUNGS AND PLEURA: The lungs are expiratory. There is increased opacity in the right greater than left lung bases which could be atelectasis or consolidation. Small pleural effusions may also be present. The mid and upper lung fields are clear. No pneumothorax. HEART AND MEDIASTINUM: Cardiomegaly without evidence of congestive heart failure. Stable mediastinum. BONES AND SOFT TISSUES: Advanced thoracic spondylosis. No new osseous finding. IMPRESSION: 1. Basilar opacities, right greater than left, compatible with atelectasis versus consolidation in the setting of low lung volumes . Small pleural effusions possible. 2. Cardiomegaly without overt congestive failure. Electronically signed by: Francis Quam MD 06/28/2024 05:56 AM EST RP Workstation: [id number]   CT ABDOMEN PELVIS WO CONTRAST Result Date: 06/27/2024 EXAM: CT ABDOMEN AND PELVIS WITHOUT CONTRAST 06/27/2024 09:07:01 PM TECHNIQUE: CT of the abdomen and pelvis was performed without the administration of intravenous contrast. Multiplanar reformatted images are provided for review. Automated exposure control, iterative reconstruction, and/or weight-based adjustment of the mA/kV was utilized to reduce the radiation dose to as low as reasonably achievable. COMPARISON: 06/07/2024 CLINICAL HISTORY: Retroperitoneal bleed suspected. Anemia following cardiac catheterization. History of metastatic pancreatic cancer. FINDINGS: LIMITATIONS/ARTIFACTS: Motion degraded images. LOWER CHEST: Complete right lower lobe atelectasis/collapse, without obstructing endobronchial lesion. LIVER: Innumerable hepatic  metastases, mildly progressive. Index lesion in segment 4b (image 31) measures 4.0 cm, previously 3.2 cm. GALLBLADDER AND BILE DUCTS: Status post cholecystectomy. No  biliary ductal dilatation. SPLEEN: No acute abnormality. PANCREAS: Pancreatic tail mass measures 3.7 x 8.4 cm, previously 4.3 x 7.2 cm, similar. This corresponds to the patient's known pancreatic cancer. ADRENAL GLANDS: No acute abnormality. KIDNEYS, URETERS AND BLADDER: No stones in the kidneys or ureters. No hydronephrosis. No perinephric or periureteral stranding. Bladder decompressed by an indwelling foley catheter. GI AND BOWEL: Enteric tube is looped in the gastric antrum. There is no bowel obstruction. PERITONEUM AND RETROPERITONEUM: No ascites. No free air. No evidence of abdominal wall and retroperitoneal hematoma. VASCULATURE: Aorta is normal in caliber. LYMPH NODES: No lymphadenopathy. REPRODUCTIVE ORGANS: Status post hysterectomy. BONES AND SOFT TISSUES: Degenerative changes of the visualized thoracolumbar spine. Moderate degenerative changes of the bilateral hips. Mild stranding / hemorrhage in the right inguinal region (image 86) extending to the medial right upper thigh (image 96), likely postprocedural. IMPRESSION: 1. No evidence of abdominal wall or retroperitoneal bleed. 2. Mild stranding/hemorrhage in the right inguinal region and medial right upper thigh, likely postprocedural. 3. Grossly stable pancreatic tail mass, corresponding to the patient's known pancreatic cancer. 4. Innumerable hepatic metastases, progressive. 5. Complete right lower lobe atelectasis/collapse, without obstructing endobronchial lesion, likely reflecting mucous plugging. Electronically signed by: Pinkie Pebbles MD 06/27/2024 09:20 PM EST RP Workstation: [id number]   ECHOCARDIOGRAM COMPLETE Result Date: 06/27/2024    ECHOCARDIOGRAM REPORT   Patient Name:   Aquila Muha Date of Exam: 06/27/2024 Medical Rec #:  [id number]          Height: Accession #:     [id number]         Weight: Date of Birth:  22-Jan-1961          BSA: Patient Age:    63 years           BP:           91/54 mmHg Patient Gender: F                  HR:           101 bpm. Exam Location:  [facility] Procedure: 2D Echo, Cardiac Doppler, Color Doppler and Intracardiac            Opacification Agent (Both Spectral and Color Flow Doppler were            utilized during procedure). Indications:     Acute myocardial infarction  History:         Patient has prior history of Echocardiogram examinations, most                  recent 12/28/2023. COPD; Risk Factors:Hypertension, Diabetes and                  Dyslipidemia.  Sonographer:     Philomena Daring Referring Phys:  [id number] DWAYNE D CALLWOOD Diagnosing Phys: Deatrice Cage MD  Sonographer Comments: Patient is intubated. IMPRESSIONS  1. Left ventricular ejection fraction, by estimation, is 45 to 50%. The left ventricle has mildly decreased function. Left ventricular endocardial border not optimally defined to evaluate regional wall motion. There is moderate left ventricular hypertrophy. Left ventricular diastolic parameters are indeterminate.  2. Right ventricular systolic function is normal. The right ventricular size is normal. Tricuspid regurgitation signal is inadequate for assessing PA pressure.  3. The mitral valve is normal in structure. No evidence of mitral valve regurgitation. No evidence of mitral stenosis.  4. The aortic valve is normal in structure. Aortic valve regurgitation is not visualized. No aortic stenosis is present.  5.  The inferior vena cava is dilated in size with >50% respiratory variability, suggesting right atrial pressure of 8 mmHg.  6. Challenging image quality. FINDINGS  Left Ventricle: Left ventricular ejection fraction, by estimation, is 45 to 50%. The left ventricle has mildly decreased function. Left ventricular endocardial border not optimally defined to evaluate regional wall motion. Definity  contrast agent was given IV to delineate  the left ventricular endocardial borders. The left ventricular internal cavity size was normal in size. There is moderate left ventricular hypertrophy. Left ventricular diastolic parameters are indeterminate. Right Ventricle: The right ventricular size is normal. No increase in right ventricular wall thickness. Right ventricular systolic function is normal. Tricuspid regurgitation signal is inadequate for assessing PA pressure. Left Atrium: Left atrial size was normal in size. Right Atrium: Right atrial size was normal in size. Pericardium: Trivial pericardial effusion is present. Mitral Valve: The mitral valve is normal in structure. No evidence of mitral valve regurgitation. No evidence of mitral valve stenosis. Tricuspid Valve: The tricuspid valve is normal in structure. Tricuspid valve regurgitation is not demonstrated. No evidence of tricuspid stenosis. Aortic Valve: The aortic valve is normal in structure. Aortic valve regurgitation is not visualized. No aortic stenosis is present. Pulmonic Valve: The pulmonic valve was normal in structure. Pulmonic valve regurgitation is not visualized. No evidence of pulmonic stenosis. Aorta: The aortic root is normal in size and structure. Venous: The inferior vena cava is dilated in size with greater than 50% respiratory variability, suggesting right atrial pressure of 8 mmHg. IAS/Shunts: No atrial level shunt detected by color flow Doppler.  LEFT VENTRICLE PLAX 2D LVIDd:         4.60 cm   Diastology LVIDs:         3.20 cm   LV e' medial:  4.46 cm/s LV PW:         1.30 cm   LV e' lateral: 7.94 cm/s LV IVS:        1.50 cm LVOT diam:     2.20 cm LV SV:         48 LVOT Area:     3.80 cm  RIGHT VENTRICLE            IVC RV S prime:     6.64 cm/s  IVC diam: 2.00 cm TAPSE (M-mode): 1.6 cm LEFT ATRIUM             RIGHT ATRIUM LA diam:        3.00 cm RA Area:     14.50 cm LA Vol (A2C):   22.0 ml RA Volume:   28.40 ml LA Vol (A4C):   32.3 ml LA Biplane Vol: 26.5 ml  AORTIC VALVE  LVOT Vmax:   105.00 cm/s LVOT Vmean:  63.900 cm/s LVOT VTI:    0.125 m  AORTA Ao Root diam: 3.10 cm Ao Asc diam:  3.40 cm  SHUNTS Systemic VTI:  0.12 m Systemic Diam: 2.20 cm Deatrice Cage MD Electronically signed by Deatrice Cage MD Signature Date/Time: 06/27/2024/3:23:10 PM    Final    DG Abd 1 View Result Date: 06/27/2024 CLINICAL DATA:  NG tube placement. EXAM: ABDOMEN - 1 VIEW COMPARISON:  12/21/2014 FINDINGS: NG tube tip is in the mid stomach with proximal side port in the region of the GE junction. NG tube could be advanced 4-5 cm to place the side port below the GE junction as clinically warranted. Visualized abdomen demonstrates nonspecific bowel gas pattern. IMPRESSION: NG tube tip is in the mid stomach  with proximal side port in the region of the GE junction. NG tube could be advanced 4-5 cm to place the side port below the GE junction as clinically warranted. Electronically Signed   By: Camellia Candle M.D.   On: 06/27/2024 05:57   DG Chest Port 1 View Result Date: 06/27/2024 CLINICAL DATA:  Status post intubation. EXAM: PORTABLE CHEST 1 VIEW COMPARISON:  06/27/2024, earlier same day FINDINGS: 0534 hours. Endotracheal tube tip is proximally 3.3 cm above the base of the carina. The NG tube passes into the stomach although the distal tip position is not included on the film. Right Port-A-Cath tip overlies the upper right atrium. Left IJ central line tip overlies the proximal SVC level. Lung volumes are low. Basilar atelectasis/infiltrate evident, left greater than right with tiny left pleural effusion. The cardio pericardial silhouette is enlarged. Telemetry leads overlie the chest. IMPRESSION: 1. Endotracheal tube tip is 3.3 cm above the base of the carina. 2. Low volume film with bibasilar atelectasis/infiltrate and tiny left pleural effusion. Electronically Signed   By: Camellia Candle M.D.   On: 06/27/2024 05:56   DG Chest Port 1 View Result Date: 06/27/2024 CLINICAL DATA:  Status post left  jugular central line placement EXAM: PORTABLE CHEST 1 VIEW COMPARISON:  Film from the previous day. FINDINGS: Cardiac shadow is stable. Right chest wall port is again seen in satisfactory position. New left jugular central line is noted at the cavoatrial junction. No pneumothorax is seen. No focal infiltrate is seen. IMPRESSION: No pneumothorax following central line placement. Electronically Signed   By: Oneil Devonshire M.D.   On: 06/27/2024 03:45   DG Chest Port 1 View Result Date: 06/26/2024 EXAM: 1 VIEW(S) XRAY OF THE CHEST 06/26/2024 07:04:08 PM COMPARISON: 05/12/2024 CLINICAL HISTORY: Dyspnea FINDINGS: LINES, TUBES AND DEVICES: Stable right chest port. LUNGS AND PLEURA: Low lung volumes. Small left pleural effusion. No focal pulmonary opacity. No pulmonary edema. No pneumothorax. HEART AND MEDIASTINUM: No acute abnormality of the cardiac and mediastinal silhouettes. BONES AND SOFT TISSUES: No acute osseous abnormality. IMPRESSION: 1. Small left pleural effusion. Electronically signed by: Norman Gatlin MD 06/26/2024 07:20 PM EST RP Workstation: [id number]   CARDIAC CATHETERIZATION Result Date: 06/26/2024   Mid RCA to Dist RCA lesion is 100% stenosed.   A drug-eluting stent was successfully placed using a STENT ONYX FRONTIER 3.0X15.   A drug-eluting stent was successfully placed using a STENT ONYX FRONTIER 3.0X12.   Post intervention, there is a 0% residual stenosis.   In the absence of any other complications or medical issues, we expect the patient to be ready for discharge from an interventional cardiology perspective.   Recommend uninterrupted dual antiplatelet therapy with Aspirin  81mg  daily and Clopidogrel 75mg  daily for a minimum of 12 months (ACS-Class I recommendation). Conclusion -Inferior STEMI 100% occlusion distal RCA TIMI 0 flow heavy thrombus Left coronary system no significant disease Intervention Successful PCI and stent to distal RCA with overlapping 3.0 x 15 + 3.0 x 12 frontier Onyx to 15  atm Lesion reduced from TIMI 0 to TIMI 2.5 sluggish flow but much improved after thrombectomy with penumbra and stent placement We elected not to continue Aggrastat or Angiomax because of concern for groin hematoma Patient originally was given prasugrel loading dose but will transition to Plavix tomorrow so she can continue Plavix and Eliquis  which we did not know at the time that she was taken We will discontinue aspirin  prior to discharge Continue dual anticoagulant therapy for 12 months then consider  discontinuing Plavix and continuing Eliquis  monotherapy Patient had significant blood loss from large right groin hematoma patient moved continuously during the case fairly obese difficult body habitus could not lie flat.  Patient also had some bleeding from her right radial side when she accidentally dislodged the sheath from her right wrist and there was significant bleeding We typed and crossed her for 2 units but did not give the order for transfusion we will going to wait till she is in the unit and reevaluate her hemoglobin level before transfusion FemoStop left in place for right groin hemostasis Patient had significant improvement in symptoms prior transferred to the unit but still had some overall confusion and agitation throughout the case Case discussed with hospitalist Will transfer cardiology care in the morning to [facility]   CT ABDOMEN PELVIS W CONTRAST Result Date: 06/09/2024 CLINICAL DATA:  Follow-up of malignant neoplasm of pancreas. * Tracking Code: BO * EXAM: CT ABDOMEN AND PELVIS WITH CONTRAST TECHNIQUE: Multidetector CT imaging of the abdomen and pelvis was performed using the standard protocol following bolus administration of intravenous contrast. RADIATION DOSE REDUCTION: This exam was performed according to the departmental dose-optimization program which includes automated exposure control, adjustment of the mA and/or kV according to patient size and/or use of iterative reconstruction  technique. CONTRAST:  OMNIPAQUE  IOHEXOL  300 MG/ML  SOLN COMPARISON:  12/25/2023 FINDINGS: Lower chest: Left base scarring. The left lower lobe cavitary nodule has resolved. Mild cardiomegaly, without pericardial or pleural effusion. Hepatobiliary: Bilateral hepatic metastasis. These are progressive. Example mass straddling segments 2 and 4A measures 3.2 x 3.0 cm on 24/2 versus 2.2 x 1.8 cm on the prior exam (when remeasured). Inferior right hepatic lobe subcapsular lesion measures 2.6 x 2.2 cm on 35/2 versus 1.3 x 1.5 cm on the prior exam (when remeasured). Cholecystectomy, without biliary ductal dilatation. Pancreas: Infiltrative pancreatic body and tail mass measures 7.2 x 4.3 cm on image 26/2 versus 7.5 x 4.2 cm on the prior, suggesting stability. Mild peripancreatic edema is similar. Spleen: Subcapsular hypoattenuating splenic lesions are increased. Example lesion medially measures 3.0 cm on 10/02 and is new. A more rounded anterior splenic lesion measures 2.2 cm on 10/02 versus 2.4 cm on the prior exam (when remeasured). Adrenals/Urinary Tract: Normal adrenal glands. Normal kidneys, without hydronephrosis. Normal urinary bladder. Stomach/Bowel: Normal stomach, without wall thickening. Moderate stool within the rectum. Normal small bowel. Vascular/Lymphatic: Aortic atherosclerosis. 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 Patent portal vein and SMV. No abdominopelvic adenopathy. Reproductive: Hysterectomy.  No adnexal mass. Other: No free pelvic fluid. Trace perihepatic ascites is new. No evidence of omental or peritoneal disease. Musculoskeletal: Advanced degenerative changes of both hips and sacroiliac joints. Lumbosacral spondylosis. IMPRESSION: 1. Similar size of infiltrative pancreatic body/tail primary. Similar nonspecific edema in the anterior pararenal space. Cannot exclude superimposed pancreatitis. 2. Moderate progression of hepatic metastasis. 3. New  hypoattenuating pancreatic lesions. Given subcapsular position and splenic vein involvement, infarcts are favored. Cannot exclude concurrent small volume metastasis. 4. Resolved left lower lobe cavitary nodule. 5. New trace perihepatic ascites. 6. Rectal stool volume suggests constipation. 7.  Aortic Atherosclerosis (ICD10-I70.0). Electronically Signed   By: Rockey Kilts M.D.   On: 06/09/2024 13:17    Microbiology: Recent Results (from the past 240 hours)  MRSA Next Gen by PCR, Nasal     Status: None   Collection Time: 06/26/24  6:30 PM   Specimen: Nasal Mucosa; Nasal Swab  Result Value Ref Range Status   MRSA by PCR Next  Gen NOT DETECTED NOT DETECTED Final    Comment: (NOTE) The GeneXpert MRSA Assay (FDA approved for NASAL specimens only), is one component of a comprehensive MRSA colonization surveillance program. It is not intended to diagnose MRSA infection nor to guide or monitor treatment for MRSA infections. Test performance is not FDA approved in patients less than 31 years old. Performed at [facility], [address] [place], [place] [address]   Culture, blood (Routine X 2) w Reflex to ID Panel     Status: None   Collection Time: 06/26/24  7:58 PM   Specimen: BLOOD  Result Value Ref Range Status   Specimen Description BLOOD BLOOD LEFT HAND  Final   Special Requests   Final    BOTTLES DRAWN AEROBIC AND ANAEROBIC Blood Culture results may not be optimal due to an inadequate volume of blood received in culture bottles   Culture   Final    NO GROWTH 5 DAYS Performed at [facility], [address] [place], [place] [address]    Report Status 07/01/2024 FINAL  Final  Culture, blood (Routine X 2) w Reflex to ID Panel     Status: None   Collection Time: 06/26/24  7:58 PM   Specimen: BLOOD  Result Value Ref Range Status   Specimen Description BLOOD BLOOD RIGHT ARM right port  Final   Special Requests   Final    BOTTLES DRAWN AEROBIC ONLY Blood Culture  adequate volume   Culture   Final    NO GROWTH 5 DAYS Performed at [facility], [address] [place], [place] [address]    Report Status 07/01/2024 FINAL  Final  Culture, Respiratory w Gram Stain     Status: None   Collection Time: 06/27/24  5:22 PM   Specimen: Tracheal Aspirate; Respiratory  Result Value Ref Range Status   Specimen Description   Final    TRACHEAL ASPIRATE Performed at [facility], [address] [place], [place] [address]    Special Requests   Final    NONE Performed at [facility], [address] [place], [place] [id number]    Gram Stain   Final    MODERATE WBC PRESENT, PREDOMINANTLY PMN ABUNDANT GRAM POSITIVE COCCI Performed at [facility], [address] [place], [place] [address]    Culture   Final    ABUNDANT STAPHYLOCOCCUS AUREUS MODERATE KLEBSIELLA PNEUMONIAE    Report Status 07/01/2024 FINAL  Final   Organism ID, Bacteria STAPHYLOCOCCUS AUREUS  Final   Organism ID, Bacteria KLEBSIELLA PNEUMONIAE  Final      Susceptibility   Klebsiella pneumoniae - MIC*    AMPICILLIN RESISTANT Resistant     CEFAZOLIN (NON-URINE) 2 SENSITIVE Sensitive     CEFEPIME  <=0.12 SENSITIVE Sensitive     ERTAPENEM <=0.12 SENSITIVE Sensitive     CEFTRIAXONE  <=0.25 SENSITIVE Sensitive     CIPROFLOXACIN <=0.06 SENSITIVE Sensitive     GENTAMICIN <=1 SENSITIVE Sensitive     MEROPENEM <=0.25 SENSITIVE Sensitive     TRIMETH/SULFA <=20 SENSITIVE Sensitive     AMPICILLIN/SULBACTAM 4 SENSITIVE Sensitive     PIP/TAZO Value in next row Sensitive      <=4 SENSITIVEThis is a modified FDA-approved test that has been validated and its performance characteristics determined by the reporting laboratory.  This laboratory is certified under the Clinical Laboratory Improvement Amendments CLIA as qualified to perform high complexity clinical laboratory testing.    * MODERATE KLEBSIELLA PNEUMONIAE   Staphylococcus aureus -  MIC*    CIPROFLOXACIN Value  in next row Sensitive      <=4 SENSITIVEThis is a modified FDA-approved test that has been validated and its performance characteristics determined by the reporting laboratory.  This laboratory is certified under the Clinical Laboratory Improvement Amendments CLIA as qualified to perform high complexity clinical laboratory testing.    ERYTHROMYCIN Value in next row Resistant      <=4 SENSITIVEThis is a modified FDA-approved test that has been validated and its performance characteristics determined by the reporting laboratory.  This laboratory is certified under the Clinical Laboratory Improvement Amendments CLIA as qualified to perform high complexity clinical laboratory testing.    GENTAMICIN Value in next row Sensitive      <=4 SENSITIVEThis is a modified FDA-approved test that has been validated and its performance characteristics determined by the reporting laboratory.  This laboratory is certified under the Clinical Laboratory Improvement Amendments CLIA as qualified to perform high complexity clinical laboratory testing.    OXACILLIN Value in next row Sensitive      <=4 SENSITIVEThis is a modified FDA-approved test that has been validated and its performance characteristics determined by the reporting laboratory.  This laboratory is certified under the Clinical Laboratory Improvement Amendments CLIA as qualified to perform high complexity clinical laboratory testing.    TETRACYCLINE Value in next row Resistant      <=4 SENSITIVEThis is a modified FDA-approved test that has been validated and its performance characteristics determined by the reporting laboratory.  This laboratory is certified under the Clinical Laboratory Improvement Amendments CLIA as qualified to perform high complexity clinical laboratory testing.    VANCOMYCIN  Value in next row Sensitive      <=4 SENSITIVEThis is a modified FDA-approved test that has been validated and its performance characteristics determined by the reporting  laboratory.  This laboratory is certified under the Clinical Laboratory Improvement Amendments CLIA as qualified to perform high complexity clinical laboratory testing.    TRIMETH/SULFA Value in next row Sensitive      <=4 SENSITIVEThis is a modified FDA-approved test that has been validated and its performance characteristics determined by the reporting laboratory.  This laboratory is certified under the Clinical Laboratory Improvement Amendments CLIA as qualified to perform high complexity clinical laboratory testing.    CLINDAMYCIN Value in next row Sensitive      <=4 SENSITIVEThis is a modified FDA-approved test that has been validated and its performance characteristics determined by the reporting laboratory.  This laboratory is certified under the Clinical Laboratory Improvement Amendments CLIA as qualified to perform high complexity clinical laboratory testing.    RIFAMPIN Value in next row Sensitive      <=4 SENSITIVEThis is a modified FDA-approved test that has been validated and its performance characteristics determined by the reporting laboratory.  This laboratory is certified under the Clinical Laboratory Improvement Amendments CLIA as qualified to perform high complexity clinical laboratory testing.    Inducible Clindamycin Value in next row Sensitive      <=4 SENSITIVEThis is a modified FDA-approved test that has been validated and its performance characteristics determined by the reporting laboratory.  This laboratory is certified under the Clinical Laboratory Improvement Amendments CLIA as qualified to perform high complexity clinical laboratory testing.    LINEZOLID Value in next row Sensitive      <=4 SENSITIVEThis is a modified FDA-approved test that has been validated and its performance characteristics determined by the reporting laboratory.  This laboratory is certified under the Clinical Laboratory Improvement Amendments CLIA  as qualified to perform high complexity clinical  laboratory testing.    * ABUNDANT STAPHYLOCOCCUS AUREUS    Time spent: 25 minutes  Signed: Alban Pepper, MD 07/18/2024

## 2024-07-18 NOTE — Plan of Care (Signed)
  Problem: Pain Managment: Goal: General experience of comfort will improve and/or be controlled Outcome: Progressing   Problem: Safety: Goal: Ability to remain free from injury will improve Outcome: Progressing

## 2024-07-18 NOTE — Progress Notes (Signed)
 Daily Progress Note   Patient Name: Quetzal Meany       Date: 2024/07/14 DOB: [birth year]  Age: 63 y.o. MRN#: [id number] Attending Physician: Franchot Novel, MD Primary Care Physician: Debarah [name], FNP Admit Date: 06/26/2024  Reason for Consultation/Follow-up: Establishing goals of care  HPI/Brief Hospital Review: Iliza Blankenbeckler is a 63 y.o. female with multiple medical problems including stage IV pancreatic cancer with liver metastasis on dose reduced 5-FU and liposomal irinotecan chemotherapy.  Patient was admitted to the hospital with STEMI requiring PCI.  Her hospitalization has been complicated by hemorrhagic shock and respiratory failure requiring intubation.  Patient also suffered a CVA.  Palliative care was consulted to address goals.   Compassionate extubation and transition to CMO by CCM team 11/14. Transferred out of ICU over night.  Subjective: Extensive chart review has been completed prior to meeting patient including labs, vital signs, imaging, progress notes, orders, and available advanced directive documents from current and previous encounters.    Visited with Ms. Mcbrearty at her bedside. She is resting in bed with eyes closed, does not acknowledge my presence in room, no signs of discomfort or distress. No family or visitors at bedside during time of visit.  Remains on continuous hydromorphone  infusion, current dosing achieving comfort, no recommendations to medication adjustments at this time. Noted cool bilateral and upper extremities. Respirations even and unlabored.  Consider hospice IPU if family present and agreeable and based on stability prior to transport.  PMT to continue to follow for ongoing needs and support.   Objective:  Physical  Exam Constitutional:      General: She is sleeping. She is not in acute distress.    Appearance: She is ill-appearing.  HENT:     Head: Normocephalic.     Mouth/Throat:     Mouth: Mucous membranes are moist.  Pulmonary:     Effort: Pulmonary effort is normal. No respiratory distress.  Abdominal:     General: There is no distension.     Palpations: Abdomen is soft.  Skin:    Comments: Cool extremities             Vital Signs: BP (!) 64/41 (BP Location: Right Leg)   Pulse (!) 111   Temp 98.3 F (36.8 C)   Resp 16   Ht 5' 1 (1.549 m)  Wt 116.6 kg   LMP 01/04/2018   SpO2 94%   BMI 48.57 kg/m  SpO2: SpO2: 94 % O2 Device: O2 Device: Room Air O2 Flow Rate: O2 Flow Rate (L/min): 2 L/min   Palliative Care Assessment & Plan   Assessment/Recommendation/Plan  CMO continues  Thank you for allowing the Palliative Medicine Team to assist in the care of this patient.  Visit includes: Detailed review of medical records (labs, imaging, vital signs), medically appropriate exam (mental status, respiratory, cardiac, skin), discussed with treatment team, counseling and educating patient, family and staff, documenting clinical information, medication management and coordination of care.  Waddell Lesches, DNP, AGNP-C Palliative Medicine   Please contact Palliative Medicine Team phone at [phone] for questions and concerns.

## 2024-07-18 NOTE — Progress Notes (Signed)
 Progress Note   Patient: Kristina Cruz FMW:[id number] DOB: 1961/04/08 DOA: 06/26/2024     6 DOS: the patient was seen and examined on 2024-07-20   Brief hospital course: 63 y.o. female with PMHx significant for Stage IV Pancreatic cancer undergoing chemotherapy who is admitted with inferior STEMI requiring PCI and DES to the distal RCA.  Course complicated by hemorrhagic shock due to bleeding from right femoral/radial cath puncture sites, metabolic acidosis, acute hypoxic respiratory failure, and acute metabolic encephalopathy requiring intubation and mechanical ventilation. Also with large right sided MCA stroke.  Patient was compassionately extubated 11/14.  11/9: Presented to ED with chest pain, found to have inferior ST elevation on EKG, Cardiac consulted and emergency cath performed with PCI and stent to distal RCA.  Post cath developed right groin hematoma and hypotension concerning for possible hemorrhagic shock.  Was placed on vasopressors, PCCM consulted. 11/10: Earlier this morning became obtunded requiring intubation.  Remains critically ill, requiring Levophed.  Massive transfusion protocol initiated (received 4 pRBC's, 2 FFP, 2 Cryo).  CT Abdomen & Pelvis without evidence of retroperitoneal bleed, did show mild stranding/hemorrhage in right inguinal region and medial right upper thigh. 11/11: No significant events overnight.  Hgb stable overnight, no additional blood products required.  Weaning Levophed (down to 16 mcg currently). Fever overnight (T Max 101.6), Tracheal aspirate with gram + cocci, will continue Zosyn for now.  On minimal vent support, plan for WUA/SBT later today as tolerated.  11/12: No significant overnight events. Slight decline in Hgb at 9.2. No longer requiring levophed. Tmax overnight 100.8, remains on Zosyn. Weaned off precedex. On minimal vent support, placed in SBT and tolerated fairly well. Noted to have left sided deficits, found to have Large right sided MCA  Stroke with midline shift on MRI Brain. 11/13: Restarted levophed and precedex last night. No acute changes. Plan for comfort care. 11/14: No significant events noted overnight.  Tracheal aspirate resulted with Staph Aureus and Klebsiella. Tentative plan for transition to comfort care today once family has visited.  Assessment and Plan: Shock, multifactorial Further monitoring and interventions stopped due to trasnition to comfort care.    Inferior STEMI s/p PCI w/ DES Parxoysmal afib All interventions have been stopped due to transition to comfort care.  Acute hypoxic respiratory failure Staph aureus and Klebsiella pneumonia History of COPD/asthma Patient was compassionately extubated 11/14 and is now comfort care  Acute blood loss anemia Further monitoring stop due to transition to comfort care   AKI  Further monitoring stopped due to transition to comfort care.    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 Patient transition to comfort care.  Type 2 diabetes further monitoring stopped due to transition to comfort         Subjective: Resting comfortably.  Physical Exam: Vitals:   Jul 20, 2024 0028 July 20, 2024 0500 Jul 20, 2024 0800 07-20-24 1253  BP: (!) 82/49 (!) 86/53  (!) 64/41  Pulse: 93 (!) 105  (!) 111  Resp:   16 16  Temp:    98.3 F (36.8 C)  TempSrc:      SpO2:      Weight:      Height:       Physical Exam  Constitutional: In no distress.  Pulmonary: Non labored breathing on room air  Abdominal: Soft. Musculoskeletal: Normal range of motion.     Neurological: Resting comfortable does not respond to voice.  Skin: Feet are cool to touch  Data Reviewed:  Labs are stopped.   Family Communication: NOne  Disposition: Status is: Inpatient Remains inpatient appropriate because: comfort care, will likely need to be transitioned to inpatient hospice.   Planned Discharge Destination: Inpatient hospice    Time spent: 35  minutes  Author: Alban Pepper, MD 08-01-24 3:27 PM  For on call review [email or web address].

## 2024-07-18 DEATH — deceased
